# Patient Record
Sex: Male | Born: 1970 | Race: White | Hispanic: No | Marital: Married | Smoking: Current every day smoker
Health system: Southern US, Community
[De-identification: ages and names within clinical notes are randomized; demographics above are authoritative.]

## PROBLEM LIST (undated history)

## (undated) DIAGNOSIS — K219 Gastro-esophageal reflux disease without esophagitis: Secondary | ICD-10-CM

## (undated) DIAGNOSIS — J449 Chronic obstructive pulmonary disease, unspecified: Secondary | ICD-10-CM

## (undated) DIAGNOSIS — I219 Acute myocardial infarction, unspecified: Secondary | ICD-10-CM

## (undated) DIAGNOSIS — I4891 Unspecified atrial fibrillation: Secondary | ICD-10-CM

## (undated) DIAGNOSIS — G4733 Obstructive sleep apnea (adult) (pediatric): Secondary | ICD-10-CM

## (undated) DIAGNOSIS — G8929 Other chronic pain: Secondary | ICD-10-CM

## (undated) DIAGNOSIS — Z9289 Personal history of other medical treatment: Secondary | ICD-10-CM

## (undated) DIAGNOSIS — I10 Essential (primary) hypertension: Secondary | ICD-10-CM

## (undated) DIAGNOSIS — I639 Cerebral infarction, unspecified: Secondary | ICD-10-CM

## (undated) DIAGNOSIS — Z87442 Personal history of urinary calculi: Secondary | ICD-10-CM

## (undated) DIAGNOSIS — R519 Headache, unspecified: Secondary | ICD-10-CM

## (undated) DIAGNOSIS — R569 Unspecified convulsions: Secondary | ICD-10-CM

## (undated) DIAGNOSIS — I499 Cardiac arrhythmia, unspecified: Secondary | ICD-10-CM

## (undated) DIAGNOSIS — R51 Headache: Secondary | ICD-10-CM

## (undated) HISTORY — DX: Gastro-esophageal reflux disease without esophagitis: K21.9

## (undated) HISTORY — DX: Other chronic pain: G89.29

## (undated) HISTORY — DX: Unspecified convulsions: R56.9

## (undated) HISTORY — PX: CHOLECYSTECTOMY: SHX55

## (undated) HISTORY — DX: Personal history of other medical treatment: Z92.89

## (undated) HISTORY — DX: Headache, unspecified: R51.9

## (undated) HISTORY — PX: ELBOW SURGERY: SHX618

## (undated) HISTORY — DX: Headache: R51

---

## 1998-09-26 ENCOUNTER — Emergency Department (HOSPITAL_COMMUNITY): Admission: EM | Admit: 1998-09-26 | Discharge: 1998-09-26 | Payer: Self-pay | Admitting: Emergency Medicine

## 2005-02-26 ENCOUNTER — Emergency Department (HOSPITAL_COMMUNITY): Admission: EM | Admit: 2005-02-26 | Discharge: 2005-02-26 | Payer: Self-pay | Admitting: Emergency Medicine

## 2005-02-28 ENCOUNTER — Emergency Department (HOSPITAL_COMMUNITY): Admission: EM | Admit: 2005-02-28 | Discharge: 2005-02-28 | Payer: Self-pay | Admitting: Family Medicine

## 2005-10-02 ENCOUNTER — Emergency Department: Payer: Self-pay | Admitting: Emergency Medicine

## 2006-02-19 ENCOUNTER — Emergency Department: Payer: Self-pay | Admitting: Emergency Medicine

## 2006-02-27 ENCOUNTER — Emergency Department: Payer: Self-pay | Admitting: Emergency Medicine

## 2006-02-27 ENCOUNTER — Other Ambulatory Visit: Payer: Self-pay

## 2007-10-28 ENCOUNTER — Ambulatory Visit: Payer: Self-pay

## 2007-12-08 ENCOUNTER — Ambulatory Visit: Payer: Self-pay | Admitting: Urology

## 2007-12-24 ENCOUNTER — Ambulatory Visit: Payer: Self-pay | Admitting: Family Medicine

## 2008-01-26 ENCOUNTER — Ambulatory Visit: Payer: Self-pay | Admitting: Surgery

## 2009-04-23 ENCOUNTER — Emergency Department (HOSPITAL_COMMUNITY): Admission: EM | Admit: 2009-04-23 | Discharge: 2009-04-23 | Payer: Self-pay | Admitting: Emergency Medicine

## 2010-02-24 ENCOUNTER — Emergency Department: Payer: Self-pay | Admitting: Emergency Medicine

## 2010-02-28 ENCOUNTER — Ambulatory Visit: Payer: Self-pay | Admitting: Specialist

## 2010-03-28 ENCOUNTER — Encounter: Payer: Self-pay | Admitting: Specialist

## 2010-04-12 ENCOUNTER — Encounter: Payer: Self-pay | Admitting: Specialist

## 2011-02-19 LAB — CBC
MCV: 88.1 fL (ref 78.0–100.0)
Platelets: 272 10*3/uL (ref 150–400)
RDW: 13.3 % (ref 11.5–15.5)
WBC: 8.8 10*3/uL (ref 4.0–10.5)

## 2011-02-19 LAB — DIFFERENTIAL
Basophils Absolute: 0.2 10*3/uL — ABNORMAL HIGH (ref 0.0–0.1)
Basophils Relative: 3 % — ABNORMAL HIGH (ref 0–1)
Eosinophils Absolute: 0.1 10*3/uL (ref 0.0–0.7)
Lymphs Abs: 1.5 10*3/uL (ref 0.7–4.0)
Neutrophils Relative %: 73 % (ref 43–77)

## 2011-02-19 LAB — D-DIMER, QUANTITATIVE: D-Dimer, Quant: 0.39 ug/mL-FEU (ref 0.00–0.48)

## 2011-02-19 LAB — BASIC METABOLIC PANEL
BUN: 16 mg/dL (ref 6–23)
Chloride: 105 mEq/L (ref 96–112)
Creatinine, Ser: 1.17 mg/dL (ref 0.4–1.5)
Glucose, Bld: 106 mg/dL — ABNORMAL HIGH (ref 70–99)

## 2012-02-03 LAB — CBC
HCT: 30.1 % — ABNORMAL LOW (ref 40.0–52.0)
MCH: 28.9 pg (ref 26.0–34.0)
MCV: 87 fL (ref 80–100)
Platelet: 352 10*3/uL (ref 150–440)
RBC: 3.48 10*6/uL — ABNORMAL LOW (ref 4.40–5.90)
WBC: 14.4 10*3/uL — ABNORMAL HIGH (ref 3.8–10.6)

## 2012-02-03 LAB — COMPREHENSIVE METABOLIC PANEL
Albumin: 4.1 g/dL (ref 3.4–5.0)
Alkaline Phosphatase: 49 U/L — ABNORMAL LOW (ref 50–136)
Bilirubin,Total: 0.3 mg/dL (ref 0.2–1.0)
Calcium, Total: 8.4 mg/dL — ABNORMAL LOW (ref 8.5–10.1)
Chloride: 105 mmol/L (ref 98–107)
Co2: 22 mmol/L (ref 21–32)
EGFR (African American): >60
Glucose: 107 mg/dL — ABNORMAL HIGH (ref 65–99)
Potassium: 3.4 mmol/L — ABNORMAL LOW (ref 3.5–5.1)
SGOT(AST): 19 U/L (ref 15–37)
Sodium: 141 mmol/L (ref 136–145)

## 2012-02-03 LAB — DRUG SCREEN, URINE
Amphetamines, Ur Screen: NEGATIVE (ref ?–1000)
Barbiturates, Ur Screen: NEGATIVE (ref ?–200)
Cannabinoid 50 Ng, Ur ~~LOC~~: POSITIVE (ref ?–50)
Cocaine Metabolite,Ur ~~LOC~~: NEGATIVE (ref ?–300)
Opiate, Ur Screen: NEGATIVE (ref ?–300)
Phencyclidine (PCP) Ur S: NEGATIVE (ref ?–25)

## 2012-02-03 LAB — TSH: Thyroid Stimulating Horm: 0.546 u[IU]/mL

## 2012-02-03 LAB — CK TOTAL AND CKMB (NOT AT ARMC): CK-MB: 0.8 ng/mL (ref 0.5–3.6)

## 2012-02-04 ENCOUNTER — Inpatient Hospital Stay: Payer: Self-pay | Admitting: Internal Medicine

## 2012-02-04 DIAGNOSIS — I4891 Unspecified atrial fibrillation: Secondary | ICD-10-CM

## 2012-02-04 LAB — HEMOGLOBIN: HGB: 8.1 g/dL — ABNORMAL LOW (ref 13.0–18.0)

## 2012-02-05 LAB — CBC WITH DIFFERENTIAL/PLATELET
Basophil #: 0.1 10*3/uL (ref 0.0–0.1)
Eosinophil #: 0.3 10*3/uL (ref 0.0–0.7)
Eosinophil %: 3.6 %
HGB: 7.9 g/dL — ABNORMAL LOW (ref 13.0–18.0)
MCH: 29 pg (ref 26.0–34.0)
MCHC: 33.6 g/dL (ref 32.0–36.0)
MCV: 87 fL (ref 80–100)
Neutrophil #: 3.9 10*3/uL (ref 1.4–6.5)
Neutrophil %: 47.5 %

## 2012-02-05 LAB — BASIC METABOLIC PANEL
BUN: 22 mg/dL — ABNORMAL HIGH (ref 7–18)
Co2: 26 mmol/L (ref 21–32)
Creatinine: 1.17 mg/dL (ref 0.60–1.30)
EGFR (Non-African Amer.): >60
Glucose: 102 mg/dL — ABNORMAL HIGH (ref 65–99)
Osmolality: 289 (ref 275–301)
Potassium: 3.7 mmol/L (ref 3.5–5.1)

## 2012-02-06 LAB — BUN: BUN: 16 mg/dL (ref 7–18)

## 2012-03-10 ENCOUNTER — Other Ambulatory Visit: Payer: Self-pay | Admitting: Internal Medicine

## 2012-03-10 LAB — CBC WITH DIFFERENTIAL/PLATELET
Basophil %: 1 %
Eosinophil #: 0.2 10*3/uL (ref 0.0–0.7)
HGB: 11.6 g/dL — ABNORMAL LOW (ref 13.0–18.0)
Lymphocyte #: 2.3 10*3/uL (ref 1.0–3.6)
MCHC: 32.7 g/dL (ref 32.0–36.0)
MCV: 84 fL (ref 80–100)
Monocyte %: 9.7 %
Neutrophil #: 5.3 10*3/uL (ref 1.4–6.5)
Platelet: 441 10*3/uL — ABNORMAL HIGH (ref 150–440)

## 2012-03-10 LAB — COMPREHENSIVE METABOLIC PANEL
Alkaline Phosphatase: 80 U/L (ref 50–136)
Bilirubin,Total: 0.3 mg/dL (ref 0.2–1.0)
Calcium, Total: 9.5 mg/dL (ref 8.5–10.1)
EGFR (African American): >60
Osmolality: 281 (ref 275–301)
SGOT(AST): 19 U/L (ref 15–37)
SGPT (ALT): 27 U/L

## 2012-03-11 ENCOUNTER — Ambulatory Visit: Payer: Self-pay | Admitting: Internal Medicine

## 2012-10-14 ENCOUNTER — Emergency Department: Payer: Self-pay | Admitting: Emergency Medicine

## 2013-07-02 ENCOUNTER — Emergency Department: Payer: Self-pay | Admitting: Emergency Medicine

## 2014-05-11 ENCOUNTER — Emergency Department: Payer: Self-pay | Admitting: Emergency Medicine

## 2015-03-06 NOTE — Consult Note (Signed)
Chief Complaint:   Subjective/Chief Complaint No acute events overnight.  Continued weakness.  No further bleeding.   VITAL SIGNS/ANCILLARY NOTES: **Vital Signs.:   26-Mar-13 05:44   Vital Signs Type Routine   Temperature Temperature (F) 98   Celsius 36.6   Temperature Source oral   Pulse Pulse 77   Pulse source per Dinamap   Respirations Respirations 20   Systolic BP Systolic BP 147   Diastolic BP (mmHg) Diastolic BP (mmHg) 82   Mean BP 100   BP Source Dinamap   Pulse Ox % Pulse Ox % 98   Pulse Ox Activity Level  At rest   Oxygen Delivery Room Air/ 21 %   Brief Assessment:   Additional Physical Exam Nose-  no bright red blood, old clot in right nostril improved OC/OP- no active bleeding or other mass/lesion   Routine Hem:  26-Mar-13 02:11    WBC (CBC) 8.2   RBC (CBC) 2.70   Hemoglobin (CBC) 7.9   Hematocrit (CBC) 23.4   Platelet Count (CBC) 269   MCV 87   MCH 29.0   MCHC 33.6   RDW 14.2  Routine Chem:  26-Mar-13 02:11    Glucose, Serum 102   BUN 22   Creatinine (comp) 1.17   Sodium, Serum 143   Potassium, Serum 3.7   Chloride, Serum 108   CO2, Serum 26   Calcium (Total), Serum 8.4   Osmolality (calc) 289   eGFR (African American) >60   eGFR (Non-African American) >60   Anion Gap 9  Routine Hem:  26-Mar-13 02:11    Neutrophil % 47.5   Lymphocyte % 38.7   Monocyte % 8.9   Eosinophil % 3.6   Basophil % 1.3   Neutrophil # 3.9   Lymphocyte # 3.2   Monocyte # 0.7   Eosinophil # 0.3   Basophil # 0.1   Assessment/Plan:  Assessment/Plan:   Assessment Epistaxis and malignant htn    Plan No further epistaxis with packing removal/saline/afrin.  Would continue afrin for next 24 hours.  As no need for anticoagulation would hold off on cauterization or merocel packing with no bleeding.  Will sign off and have patient follow up as outpatient if bleeding returns.   Electronic Signatures: Leandre Wien, Shela Leff (MD)  (Signed 26-Mar-13 07:36)  Authored:  Chief Complaint, VITAL SIGNS/ANCILLARY NOTES, Brief Assessment, Lab Results, Assessment/Plan   Last Updated: 26-Mar-13 07:36 by Pascal Lux (MD)

## 2015-03-06 NOTE — Consult Note (Signed)
PATIENT NAME:  Jason Romero, Jason Romero MR#:  [id number] DATE OF BIRTH:  [birth year]  DATE OF CONSULTATION:  02/04/2012  REFERRING PHYSICIAN:  Larena GlassmanAmir Firozvi, MD CONSULTING PHYSICIAN:  Dwayne D. Callwood, MD  INDICATION: Atrial fibrillation and malignant hypertension with nose bleeding.   HISTORY OF PRESENT ILLNESS: [name] is a 44 year old white male with history of hypertension, untreated. He has no primary physician. He has had some recurrent nosebleeds which caused anemia. [place] started having the bleeding. Because it did not go away, he finally came to the emergency room for evaluation and was found to have an elevated blood pressure of over 200, so he was brought to the emergency room for evaluation. He was found to be in rapid atrial fibrillation of unclear etiology. He was subsequently treated for malignant hypertension and bleeding with weakness and fatigue. He denied any chest pain.  No gastrointestinal bleeding was noted.  REVIEW OF SYSTEMS: No blackout spells or syncope. No nausea or vomiting. No fever. No chills. No sweats. No weight loss or weight gain. No hemoptysis or hematemesis. No bright red blood per rectum. He has had nose bleeds, poorly controlled hypertension, and history of smoking.   PAST MEDICAL HISTORY:  1. Hypertension.  2. Reflux.   PAST SURGICAL HISTORY:  1. Scrotal cyst removal three years ago. 2. Cholecystectomy.  3. Injury to scalp.  4. Elbow fracture.   SOCIAL HISTORY: Married, living with his wife.   FAMILY HISTORY: Noncontributory.   ALLERGIES: No known drug allergies.   MEDICATIONS: None.   PHYSICAL EXAMINATION:   VITAL SIGNS: Blood pressure was 225/110 initially, pulse 100, and respiratory rate 16, with a nose bleed.   HEENT: Normocephalic, atraumatic. Pupils reactive to light. Nose bleeding which has stopped now.   NECK: Supple. No jugular venous distention, bruits or adenopathy.   LUNGS: Clear to auscultation and  percussion. No significant wheeze, rhonchi, or rale.   HEART: Regular rate and rhythm.   ABDOMEN: Positive bowel sounds. No rebound, guarding, or tenderness.   EXTREMITIES: Examination is within normal limits.   NEUROLOGIC: Examination is intact.   SKIN: Examination is normal.   LABS/STUDIES: Glucose 107, BUN 35, creatinine 1.2, and sodium 141. CPKs were negative. TSH 0.5. Drug screen positive for marijuana. WBC 14, hemoglobin 10, hematocrit 30, and platelet count 252.   EKG: Atrial fibrillation, rapid ventricular response, nonspecific findings.   ASSESSMENT:  1. Atrial fibrillation. 2. Paroxysmal malignant hypertension. 3. Smoking. 4. Recurrent nosebleeds.  5. Mild anemia.  6. Tobacco and marijuana abuse. 7. Reflux.   PLAN: I agree with admit. Hold anticoagulation. Rate control and blood pressure control with either calcium blocker or beta blocker. Agree with ENT evaluation. Advised the patient to quit smoking. Have the patient establish with a primary doctor to control his blood pressure which in turn will help his atrial fibrillation. Have the patient follow-up with cardiology as necessary.  ____________________________ Bobbie Stackwayne D. Juliann Paresallwood, MD ddc:slb D: 02/05/2012 10:48:00 ET T: 02/05/2012 11:34:46 ET JOB#: [id number]  cc: Dwayne D. Juliann Paresallwood, MD, <Dictator> Alwyn PeaWAYNE D CALLWOOD MD ELECTRONICALLY SIGNED 03/07/2012 15:48

## 2015-03-06 NOTE — Consult Note (Signed)
Brief Consult Note: Diagnosis: Epistaxis, Hypertension.   Patient was seen by consultant.   Consult note dictated.   Recommend further assessment or treatment.   Orders entered.   Comments: 44 y.o. male with history of uncontrolled HTN with history of epistaxis x 4 days.  Mostly from right nostril.  Able to get stopped intermittently, but [facility] and rec'd [facility] E[facility] evaluation.  He had continued bleeding with headache and [facility].  Bleeding stopped with control of blood pressure.  Patient reports not current bleeding but still has gauze in nose.  Feels tired but no respiratory issues.  PE- Ears- prominent vessels on left TM, no effusion Nose- packing removed, with clot in right nasal cavity, no active bleeding, left clear OC/OP- no posterior bleeding, no abnormal mass or lesion Neck- clear  Impression:  Epistaxis and uncontrolled HTN with resolution of epistaxis with blood pressure control Plan: 1)  No nose blowing or trauma to nose 2)  Afrin QID X 48 hours 3)  Nasal Saline irrigation 4)  Will re-evaluate later today, will need packing if continues.  Electronic Signatures: Lavontae Cornia, Rayfield Citizenreighton Charles (MD)  (Signed [phone]-Mar-13 11:15)  Authored: Brief Consult Note   Last Updated: 25-Mar-13 11:15 by Flossie DibbleVaught, Oleva Koo Charles (MD)

## 2015-03-06 NOTE — Discharge Summary (Signed)
PATIENT NAME:  Jason Romero, Jason Romero MR#:  [id number] DATE OF BIRTH:  01/25/1971  DATE OF ADMISSION:  02/04/2012 DATE OF DISCHARGE:  02/06/2012  PRIMARY CARE PHYSICIAN: Dr. Loma Sender.  The patient is establishing care to see Dr. Burnadette Pop starting 04/01/2012.  REASON FOR ADMISSION: Recurrent nosebleeds, headache, uncontrolled hypertension, and heart palpitations.   DISCHARGE DIAGNOSES:  1. Epistaxis due to malignant hypertension.  2. Malignant hypertension. 3. Acute posthemorrhagic (symptomatic) anemia due to epistaxis requiring blood transfusion.  4. History of hypertension.  5. History of gastroesophageal reflux disease.  6. Removal of scrotal cyst.  7. Cholecystectomy.  8. History of scalp injury with chain saw approximately 10 years ago.  9. History of left elbow fracture.  10. New onset atrial fibrillation with rapid ventricular response, paroxysmal atrial fibrillation felt to be driven by either malignant hypertension or anemia.  11. Hypokalemia, resolved.  12. Leukocytosis, felt to be stress induced, with resolution of leukocytosis.   CONSULTS:  1. ENT, Dr. Andee Poles.  2. Cardiology, Dr. Juliann Pares.   DISCHARGE DISPOSITION: Home.    DISCHARGE MEDICATIONS:  1. HCTZ 25 mg p.o. daily.  2. Cardizem CD 120 mg p.o. daily.  3. Tylenol 325 mg 1 to 2 tablets p.o. every 4 to 6 hours p.r.n. pain.   DISCHARGE CONDITION: Improved, stable.   DISCHARGE ACTIVITY: As tolerated.   DISCHARGE DIET: Low sodium.   DISCHARGE INSTRUCTIONS:  1. Take medications as prescribed.  2. Return to the Emergency Department  for recurrence of nosebleeds or for any development of chest pain, shortness of breath, heart palpitations, numbness, tingling, difficulty speaking or swallowing, drooping of face, or weakness.  FOLLOW-UP INSTRUCTIONS: 1. Follow up with Dr. Loma Sender in 1 to 2 weeks. The patient needs repeat blood pressure check and repeat hemoglobin within one week. 2. Follow up with Dr.  Burnadette Pop 04/01/2012. 3. Follow up with Dr. Juliann Pares on 02/15/2012 at 10:45 a.m.   PROCEDURES:  1. Chest x-ray PA and lateral 02/03/2012: No acute cardiopulmonary abnormalities are noted.  2. 2-D echocardiogram on 02/04/2012: LV grossly normal in size. No thrombus. LV systolic function normal. Ejection fraction greater than 55%. Normal LV wall thickness and wall motion. RV systolic function is normal.  3. Nasal packing done by Dr. Andee Poles with removal of packing prior to discharge.   PERTINENT LABORATORY DATA: Complete metabolic panel within normal limits except for BUN 35 and potassium 3.4. BUN 16 on the day of discharge, potassium 3.7 on  02/05/2012. TSH 0.546.  Cardiac enzymes negative on admission.   CBC on admission: WBC 14.1, hemoglobin 10.1, hematocrit 30.1, platelets 252. Hemoglobin fell to 7.9 on  02/05/2012 after which the patient was transfused blood and hemoglobin was 9.6 on the day of discharge.   EKG on admission with atrial fibrillation with rapid ventricular response, heart rate 120 beats per minute without acute ST or T wave changes.   EKG on 02/04/2012: Normal sinus rhythm, heart rate 86 beats per minute. Nonspecific ST and T wave changes.   BRIEF HISTORY/HOSPITAL COURSE: The patient is a pleasant 44 year old male with past medical history of hypertension and gastroesophageal reflux disease who presented to the Emergency Department secondary to epistaxis and was noted to have malignant hypertension in addition to atrial fibrillation with RVR. Please see dictated admission History and Physical for pertinent details surrounding the onset of this hospitalization. Please see below for further details. 1. Epistaxis: For which the patient was seen by ENT and his epistaxis was felt to be secondary to malignant hypertension.  Initially the patient required nasal packing by Dr. Andee PolesVaught.  He was also placed on Afrin, intranasal saline spray, as well as bacitracin ointment as advised by ENT.   Dr. Andee PolesVaught recommended blood pressure control. After the patient's blood pressure stabilized he had no further epistaxis. He will not need followup with ENT at this time unless epistaxis recurs, but Dr. Andee PolesVaught strongly advised aggressive blood pressure control. The patient did lose a significant amount of blood secondary to epistaxis and he had generalized weakness; he was felt to have symptomatic anemia and after transfusion of blood his weakness had resolved. His anemia was acute posthemorrhagic secondary to epistaxis. As above, epistaxis resolved with blood pressure  stabilization.  2. Malignant hypertension: The patient was started on gradual blood pressure reduction and was started on HCTZ but also on Cardizem for heart rate control, given atrial fibrillation with RVR. With these blood pressure medications blood pressure was gradually reduced and blood pressure is well controlled on the day of discharge. The patient is tolerating medications well and after blood pressure had stabilized his epistaxis had resolved.  3. New onset atrial fibrillation with rapid ventricular response:  For which the patient was seen by cardiology and felt to have paroxysmal atrial fibrillation.  He was placed on calcium channel blocker therapy initially and thereafter spontaneously converted to normal sinus rhythm and atrial fibrillation did not recur. Dr. Juliann Paresallwood was in agreement with Cardizem for heart rate control and felt that atrial fibrillation could be driven by either malignant hypertension versus anemia. The patient's CHAD2 score is 1 and there is no need for anticoagulation at this time per cardiology. Dr. Juliann Paresallwood did not recommend keeping the patient on antiplatelet therapy including aspirin given the patient's epistaxis and anemia and increased risk of bleeding at this time. The patient will follow up with Dr. Juliann Paresallwood of cardiology closely as an outpatient for atrial fibrillation.  4. Hypokalemia: Replaced with  potassium chloride with resolution of serum potassium level thereafter. 5. Elevated BUN: Now resolved. The patient stated that he digested and swallowed some blood due to profound epistaxis and this is likely the explanation of his elevated BUN, but otherwise he was not dehydrated and BUN normalized with IV fluids.  6. Leukocytosis: Felt to be of noninfectious etiology and likely stress induced. WBC count is normal at the time of discharge. 7. Gastroesophageal reflux disease:  Stable. The patient was asymptomatic and denied any reflux, indigestion, heartburn, or dyspepsia this hospitalization.   On 02/06/2012 the patient was hemodynamically stable without any epistaxis and blood pressure was well controlled and at goal, and he was in normal sinus rhythm. He was felt to be stable for discharge home with close outpatient followup to which the patient was agreeable.   TIME SPENT ON DISCHARGE:  Greater than 30 minutes.    ____________________________ Elon AlasKamran N. Tevyn Codd, MD knl:bjt D: 02/10/2012 21:28:00 ET T: 02/12/2012 11:26:10 ET JOB#: [id number]  cc: Elon AlasKamran N. Kule Gascoigne, MD, <Dictator> Marisue IvanKanhka Linthavong, MD Marcine Matarharles W. Phillips Jr., MD Dwayne D. Juliann Paresallwood, MD Elon AlasKAMRAN N Regan Llorente MD ELECTRONICALLY SIGNED 02/20/2012 22:01

## 2015-03-06 NOTE — H&P (Signed)
PATIENT NAME:  Jason Romero, Italo H MR#:  [id number] DATE OF BIRTH:  1971/03/14  DATE OF ADMISSION:  02/03/2012  PRIMARY CARE PHYSICIAN: None.  CHIEF COMPLAINT: Recurrent nosebleed, headache, and uncontrolled hypertension and palpitations.   HISTORY OF PRESENT ILLNESS: [name] is a 44 year old Caucasian male with history of hypertension that is untreated. The patient has no primary care physician. He started to have recurrent nosebleeds since Thursday and this has continued for the last four days. He went to another emergency department at another hospital. He was given Tylenol and told to follow up as an outpatient and if there is recurrent nose bleed to see ENT. The patient [facility] and he started to have spontaneous nosebleeds. His blood pressure was high reaching 202/125. He developed headache and he also noticed some shortness of breath and dizziness, especially upon standing. His dizziness subsides if he lies down. Upon arrival here, at our hospital, it was noticed here that he has some palpitations. Evaluation here reveals atrial fibrillation. While the patient has been here, he vomited one time and blood appears to be swallowed from his nose bleed. He denies having any abdominal pain. No prior history of peptic ulcer disease or GI bleed. The patient was admitted for further evaluation of his medical problems.   REVIEW OF SYSTEMS: CONSTITUTIONAL: The patient denies any fever. No chills. No night sweats. No fatigue. EYES: Denies blurring of vision. No double vision. ENT: No hearing impairment. No sore throat. No dysphagia. CARDIOVASCULAR: No chest pain. Reports some shortness of breath. No peripheral edema. No syncope but he has dizziness especially upon standing. RESPIRATORY: No cough. No sputum production. No chest pain. GASTROINTESTINAL: No abdominal pain. He had one episode of vomiting here that has some blood. No diarrhea. No constipation. No melena or  hematochezia. GENITOURINARY: No dysuria or frequency of urination. MUSCULOSKELETAL: No joint pain or swelling. No muscular pain or swelling. INTEGUMENTARY: No skin rash. No ulcers. NEUROLOGY: No focal weakness. No seizure activity. He admits having some headache. PSYCHIATRY: No anxiety. No depression. ENDOCRINE: No night sweats. No polyuria or polydipsia. No heat or cold intolerance.    PAST MEDICAL HISTORY:  1. History of systemic hypertension.  2. History of gastroesophageal reflux disease.   PAST SURGICAL HISTORY:  1. History of scrotal cyst removal a few years ago.  2. History of cholecystectomy.  3. History of injury to the scalp area with a chain saw about 10 years ago.  4. History of left elbow fracture.   SOCIAL HISTORY: He is married and living with his wife.   FAMILY HISTORY: He reports that he does not have much information about his family. He was raised by his stepfather.   ADMISSION MEDICATIONS: None.   ALLERGIES: No known drug allergies.  SOCIAL HABITS: Chronic smoker of one pack per day since age of 44. He smokes marijuana every now and then. He denies any cocaine abuse or other drug abuse, and no alcohol abuse as well.   PHYSICAL EXAMINATION:   VITAL SIGNS: Initial blood pressure was 225 systolic. His blood pressure gradually went down after receiving intravenous diltiazem, down to 125/83. Respiratory rate is 18, pulse 107, temperature 97.6, and oxygen saturation 99%.   GENERAL APPEARANCE: Young male lying in bed in no acute distress.   HEAD AND NECK EXAMINATION: No pallor. No icterus. No cyanosis.  EARS, NOSE, AND THROAT: Hearing was normal. Nasal mucosa - his nose is packed with gauze and there is crusted old  blood.  Lips and tongue were normal.   EYES: Normal iris and conjunctivae. There is mild exophthalmos.  Pupils are about 4 to 5 mm, equal and reactive to light.   NECK: Supple. Trachea at midline. No thyromegaly. No cervical lymphadenopathy. No masses.    HEART: Irregular S1 and S2. No S3 or S4. No murmur. No gallop. No carotid bruits.   RESPIRATORY: Normal breathing pattern without use of accessory muscles. No rales. No wheezing.   ABDOMEN: Soft without tenderness. No hepatosplenomegaly. No masses. No hernias.   SKIN: No ulcers. No subcutaneous nodules.   MUSCULOSKELETAL: No joint swelling. No clubbing.   NEUROLOGIC: Cranial nerves II through XII were intact. No focal motor deficit.   PSYCHIATRIC: The patient is alert and oriented x3. Mood and affect were normal.   LABS/STUDIES: Chest x-ray showed heart size was normal. No consolidation. No effusion.   Serum glucose 107, BUN 35, creatinine 1.2, sodium 141, potassium 3.4. Liver function tests were normal. CPK 77. Troponin 0.02. Thyroid stimulating hormone was 0.5. Drug screen was positive for cannabinoid. CBC showed white count of 14,000, hemoglobin 10, hematocrit 30, and platelet count 352.   EKG showed atrial fibrillation with rapid ventricular rate at 122 per minute. Nonspecific ST-T wave abnormalities.   ASSESSMENT:  1. Atrial fibrillation with rapid ventricular rate, likely this is new onset since his symptoms just started, in terms of palpitations.  2. Uncontrolled hypertension.  3. Recurrent epistaxis. 4. Normochromic, normocytic anemia likely from his nose bleed.  5. Tobacco and marijuana abuse.  6. History of gastroesophageal reflux disease.  7. History of cholecystectomy. 8. History of left elbow fracture.   PLAN: We will admit the patient to the medical floor on telemetry monitoring. I will continue diltiazem. I will avoid using aspirin in this case. We will obtain echocardiogram and cardiology consultation. ENT consult to look into his recurrent epistaxis. Repeat hemoglobin in the morning, and I will also repeat EKG in the a.m. The patient needs optimal control of his blood pressure. I will use a combination of diltiazem along with hydrochlorothiazide. Nicotine patch and  the patient needs to quit smoking.   TIME SPENT EVALUATING THIS PATIENT: More than 55 minutes.  ____________________________ Carney Corners. Rudene Re, MD amd:slb D: 02/04/2012 00:04:00 ET T: 02/04/2012 07:41:26 ET JOB#: [id number]  cc: Carney Corners. Rudene Re, MD, <Dictator> Zollie Scale MD ELECTRONICALLY SIGNED 02/05/2012 22:47

## 2015-03-06 NOTE — Consult Note (Signed)
Chief Complaint:   Subjective/Chief Complaint no epistaxis since packing removed.  patient passed small amount of clot and has had some old blood discharge but no bright red blood   Brief Assessment:   Respiratory normal resp effort   Assessment/Plan:  Assessment/Plan:   Assessment Epistaxis, uncontrolled HTN    Plan No further epistaxis with packing removed.  Cont Afrin, saline, bacitracin ointment.   Electronic Signatures: Glenden Rossell, Rayfield Citizenreighton Charles (MD)  (Signed [phone]-Mar-13 15:54)  Authored: Chief Complaint, Brief Assessment, Assessment/Plan   Last Updated: 25-Mar-13 15:54 by Flossie DibbleVaught, Kasheem Toner Charles (MD)

## 2015-03-06 NOTE — Consult Note (Signed)
PATIENT NAME:  Vilma PraderLAKINS, Chidi H MR#:  [id number] DATE OF BIRTH:  [birth year]  DATE OF CONSULTATION:  02/04/2012  REFERRING PHYSICIAN:  Dr. Rudene Rearwish  CONSULTING PHYSICIAN:  Kyung Ruddreighton C. Kanai Hilger, MD  REASON FOR CONSULTATION: Epistaxis.   HISTORY OF PRESENT ILLNESS: Patient is a 44 year old male with history of uncontrolled hypertension who presented to an outside ED for evaluation of epistaxis. This was controlled with topical pressure. He was discharged and referred for an outpatient evaluation with ENT. [facility] when the bleeding recurred and was admitted with blood pressure in the 220 systolic and 120s diastolic. After admission his nosebleed has stopped. I was asked for evaluation given his history of recurrent epistaxis. He was also noted to have atrial fibrillation as well. He reports that his the bleeding has stopped since he has been placing some packing gauze in his nose.   PAST MEDICAL HISTORY:  1. Hypertension. 2. Reflux. 3. MRSA.   PAST SURGICAL HISTORY:  1. Cholecystectomy. 2. Cyst in his groin.   FAMILY HISTORY: Noncontributory.   SOCIAL HISTORY: Patient has a history of intermittent marijuana use.  ALLERGIES: No known drug allergies.   CURRENT MEDICATIONS: Reviewed and documented in the medical record.   PHYSICAL EXAMINATION:  GENERAL: Patient is a 44 year old male in no acute distress. HE is alert and oriented x3.   EARS: EACs are clear. TMs are intact. No perforation or effusion. He has some hypervascularity over his left drum.   NOSE: Nose reveals a clot filling the right nasal cavity after the packing was removed. There is no evidence of any active bleeding. Left nasal cavity is clear.  ORAL CAVITY AND OROPHARYNX: No posterior oropharyngeal bleeding or mass or lesion.   NECK: Supple. No lymphadenopathy or thyromegaly.   IMPRESSION: Epistaxis with uncontrolled hypertension. No active bleeding now that blood pressure is normalized.   PLAN: I discussed  my findings with the patient. I recommend topical management at this point where many times just controlling of uncontrolled hypertension will then resolve hypertension and prevent need for packing. If he continues to bleed he will need a pack placed in his right nasal cavity. I will place him on some Afrin as well as nasal saline and bacitracin ointment to his nose and I will check on him later on today.  ____________________________ Kyung Ruddreighton C. India Jolin, MD ccv:cms D: 02/04/2012 11:42:00 ET T: 02/04/2012 11:52:08 ET JOB#: [id number]  cc: Kyung Ruddreighton C. Savhanna Sliva, MD, <Dictator> Kyung RuddREIGHTON C Blaze Sandin MD ELECTRONICALLY SIGNED 02/11/2012 9:57

## 2015-04-25 ENCOUNTER — Emergency Department: Payer: Self-pay

## 2015-04-25 ENCOUNTER — Emergency Department
Admission: EM | Admit: 2015-04-25 | Discharge: 2015-04-25 | Disposition: A | Discharge status: Home / Self Care | Payer: Self-pay | Attending: Emergency Medicine | Admitting: Emergency Medicine

## 2015-04-25 ENCOUNTER — Other Ambulatory Visit: Payer: Self-pay

## 2015-04-25 ENCOUNTER — Encounter: Payer: Self-pay | Admitting: Emergency Medicine

## 2015-04-25 DIAGNOSIS — R51 Headache: Secondary | ICD-10-CM | POA: Insufficient documentation

## 2015-04-25 DIAGNOSIS — R079 Chest pain, unspecified: Secondary | ICD-10-CM | POA: Insufficient documentation

## 2015-04-25 DIAGNOSIS — M25473 Effusion, unspecified ankle: Secondary | ICD-10-CM | POA: Insufficient documentation

## 2015-04-25 DIAGNOSIS — I252 Old myocardial infarction: Secondary | ICD-10-CM | POA: Insufficient documentation

## 2015-04-25 DIAGNOSIS — Z79899 Other long term (current) drug therapy: Secondary | ICD-10-CM | POA: Insufficient documentation

## 2015-04-25 DIAGNOSIS — Z720 Tobacco use: Secondary | ICD-10-CM | POA: Insufficient documentation

## 2015-04-25 DIAGNOSIS — Z7982 Long term (current) use of aspirin: Secondary | ICD-10-CM | POA: Insufficient documentation

## 2015-04-25 DIAGNOSIS — I10 Essential (primary) hypertension: Secondary | ICD-10-CM | POA: Insufficient documentation

## 2015-04-25 HISTORY — DX: Chronic obstructive pulmonary disease, unspecified: J44.9

## 2015-04-25 HISTORY — DX: Essential (primary) hypertension: I10

## 2015-04-25 HISTORY — DX: Unspecified atrial fibrillation: I48.91

## 2015-04-25 HISTORY — DX: Acute myocardial infarction, unspecified: I21.9

## 2015-04-25 LAB — BASIC METABOLIC PANEL
ANION GAP: 8 (ref 5–15)
BUN: 16 mg/dL (ref 6–20)
CHLORIDE: 112 mmol/L — AB (ref 101–111)
CO2: 21 mmol/L — ABNORMAL LOW (ref 22–32)
Calcium: 9.7 mg/dL (ref 8.9–10.3)
Creatinine, Ser: 1 mg/dL (ref 0.61–1.24)
GFR calc non Af Amer: >60 mL/min (ref >60)
Glucose, Bld: 114 mg/dL — ABNORMAL HIGH (ref 65–99)
POTASSIUM: 3.5 mmol/L (ref 3.5–5.1)
SODIUM: 141 mmol/L (ref 135–145)

## 2015-04-25 LAB — CBC
HCT: 44.3 % (ref 40.0–52.0)
HEMOGLOBIN: 14.8 g/dL (ref 13.0–18.0)
MCH: 28.8 pg (ref 26.0–34.0)
MCHC: 33.4 g/dL (ref 32.0–36.0)
MCV: 86.4 fL (ref 80.0–100.0)
Platelets: 344 10*3/uL (ref 150–440)
RBC: 5.12 MIL/uL (ref 4.40–5.90)
RDW: 15.8 % — ABNORMAL HIGH (ref 11.5–14.5)
WBC: 8.9 10*3/uL (ref 3.8–10.6)

## 2015-04-25 LAB — TROPONIN I
TROPONIN I: 0.03 ng/mL (ref <0.031)
TROPONIN I: 0.03 ng/mL (ref <0.031)

## 2015-04-25 MED ORDER — ACETAMINOPHEN 500 MG PO TABS
1000.0000 mg | ORAL_TABLET | Freq: Once | ORAL | Status: AC
  Administered 2015-04-25: 1000 mg via ORAL

## 2015-04-25 MED ORDER — ACETAMINOPHEN 500 MG PO TABS
ORAL_TABLET | ORAL | Status: AC
  Administered 2015-04-25: 1000 mg via ORAL
  Filled 2015-04-25: qty 2

## 2015-04-25 MED ORDER — NITROGLYCERIN 0.4 MG SL SUBL
0.4000 mg | SUBLINGUAL_TABLET | SUBLINGUAL | Status: DC | PRN
  Administered 2015-04-25: 0.4 mg via SUBLINGUAL

## 2015-04-25 MED ORDER — NITROGLYCERIN 0.4 MG SL SUBL
SUBLINGUAL_TABLET | SUBLINGUAL | Status: AC
  Administered 2015-04-25: 0.4 mg via SUBLINGUAL
  Filled 2015-04-25: qty 1

## 2015-04-25 NOTE — ED Notes (Signed)
Pt reports that he developed chest pain that radiates down his left arm and into his back. He reports that when he bends over he gets dizzy and has been coughing up white phlem. He is able to speak in complete clear sentences. He does smell of cigarette smoke.

## 2015-04-25 NOTE — ED Provider Notes (Signed)
[facility] Emergency Department Provider Note  ____________________________________________  Time seen: Approximately 5:40 PM  I have reviewed the triage vital signs and the nursing notes.   HISTORY  Chief Complaint Chest Pain    HPI Jason Romero is a 44 y.o. male with history of hypertension, MI,tobacco abuse who presents to the emergency department after central chest pain radiating to his left arm awoke him from sleep between 3 and 4 AM. The pain was associated with nausea, diaphoresis, and lightheadedness on bending down. Patient noted his blood pressure was 180/140 at the time of the episode. He took one of his wife's nitroglycerin tablets which brought the pain from a level 10 to a 6 and he went back to sleep until his alarm went off at 6 AM. Then at 10 AM while laying down the pain began to increase back to a 10. It is currently at a 6 and feels worse in his left arm than in his chest. He he states he has numerous similar episodes in the past. He states at baseline his blood pressure runs around 160/140. He states he has felt "worn out" all day.  He endorses chills but denies fever. He did not vomit but coughed up a large amount of white phlegm with the initial episode today. He states he has daily brown phlegm. He is on 5 antihypertensives which she states have been at the same dose for several years. He states he had a cardiac catheterization by Dr. Milta Deiters last year that did not require any stents. He thinks this was his second catheterization his first was done by Dr. Juliann Pares.  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   Past Medical History  Diagnosis Date  . Hypertension   . COPD (chronic obstructive pulmonary disease)   . MI (myocardial infarction)   . A-fib     There are no active problems to display for this patient.   Past Surgical History  Procedure Laterality Date  . Cholecystectomy      Current Outpatient Rx   Name  Route  Sig  Dispense  Refill  . acetaminophen (TYLENOL) 500 MG tablet   Oral   Take 1,000 mg by mouth every 6 (six) hours as needed for mild pain or headache.         . ALPRAZolam (XANAX) 0.25 MG tablet   Oral   Take 0.25 mg by mouth 4 (four) times daily as needed for anxiety.         Marland Kitchen aspirin EC 81 MG tablet   Oral   Take 81 mg by mouth daily.         . cloNIDine (CATAPRES) 0.1 MG tablet   Oral   Take 0.1 mg by mouth 2 (two) times daily.         . hydrALAZINE (APRESOLINE) 25 MG tablet   Oral   Take 25 mg by mouth 3 (three) times daily.         . hydrochlorothiazide (MICROZIDE) 12.5 MG capsule   Oral   Take 12.5 mg by mouth daily.         Marland Kitchen lisinopril (PRINIVIL,ZESTRIL) 40 MG tablet   Oral   Take 40 mg by mouth daily.         . metoprolol (LOPRESSOR) 100 MG tablet   Oral   Take 100 mg by mouth 2 (two) times daily.         . nitroGLYCERIN (NITROSTAT) 0.4  MG SL tablet   Sublingual   Place 0.4 mg under the tongue every 5 (five) minutes as needed for chest pain.           Allergies Hydrocodone and Ibuprofen  History reviewed. No pertinent family history.  Social History History  Substance Use Topics  . Smoking status: Current Every Day Smoker  . Smokeless tobacco: Not on file  . Alcohol Use: No    Review of Systems Constitutional: See history of present illness Eyes: No visual changes. ENT: No sore throat. Cardiovascular: See history of present illness Respiratory: See history of present illness Gastrointestinal: No abdominal pain.  no vomiting.  No diarrhea.   Musculoskeletal: Negative for back pain. Does endorse mild ankle swelling that gets worse after he is been on his feet all day. Skin: Negative for rash. Neurological: Mild headache since being in the ER. No focal weakness or numbness. Psychiatric:Mood is normal Endocrine:No hot/cold intolerance 10-point ROS otherwise  negative.  ____________________________________________   PHYSICAL EXAM:  VITAL SIGNS: ED Triage Vitals  Enc Vitals Group     BP 04/25/15 1332 169/101 mmHg     Pulse Rate 04/25/15 1332 66     Resp 04/25/15 1332 20     Temp 04/25/15 1332 98.1 F (36.7 C)     Temp Source 04/25/15 1332 Oral     SpO2 04/25/15 1332 93 %     Weight 04/25/15 1326 275 lb (124.739 kg)     Height 04/25/15 1326  (1.88 m)     Head Cir --      Peak Flow --      Pain Score 04/25/15 1327 5     Pain Loc --      Pain Edu? --      Excl. in GC? --     Constitutional: Alert and oriented. Well appearing and in no acute distress. Eyes: Conjunctivae are normal. PERRL. EOMI. Head: Atraumatic. Nose: No congestion/rhinnorhea. Mouth/Throat: Mucous membranes are slightly dry.  Oropharynx non-erythematous. Neck: No stridor.   Lymphatic: No cervical lymphadenopathy. Cardiovascular: Normal rate, regular rhythm. Grossly normal heart sounds.  Peripheral pulses 2+ B Respiratory: Normal respiratory effort.  No retractions. Lungs CTAB. Gastrointestinal: Soft and nontender. No distention. No CVA tenderness. Musculoskeletal: No lower extremity tenderness nor edema.  No calf TTP. Neurologic:  Normal speech and language. No gross focal neurologic deficits are appreciated. Speech is normal.  Skin:  Skin is warm, dry and intact. No rash noted. Skin is tanned Psychiatric: Mood and affect are normal. Speech and behavior are normal.  ____________________________________________   LABS (all labs ordered are listed, but only abnormal results are displayed)  Labs Reviewed  CBC - Abnormal; Notable for the following:    RDW 15.8 (*)    All other components within normal limits  BASIC METABOLIC PANEL - Abnormal; Notable for the following:    Chloride 112 (*)    CO2 21 (*)    Glucose, Bld 114 (*)    All other components within normal limits  TROPONIN I  TROPONIN I   ____________________________________________  EKG    Date: 04/25/2015 1627  Rate: 59  Rhythm: normal sinus rhythm  QRS Axis: normal  Intervals: normal  ST/T Wave abnormalities: normal  Conduction Disutrbances: none  Narrative Interpretation: unremarkable  ________________________________________  RADIOLOGY  Chest x-ray-normal ____________________________________________   PROCEDURES  Procedure(s) performed: None  Critical Care performed: No  ____________________________________________   INITIAL IMPRESSION / ASSESSMENT AND PLAN / ED COURSE  Pertinent labs & imaging  results that were available during my care of the patient were reviewed by me and considered in my medical decision making (see chart for details).  Chest pain concerning for possible ACS. We will cycle enzymes 2 and discussed with Dr. Welton Flakes.  ----------------------------------------- 6:41 PM on 04/25/2015 -----------------------------------------  Patient now pain-free. Blood pressure 141/78 on my exam after patient received nitroglycerin. I discussed the case with Dr. Welton Flakes. He agrees with outpatient management given that patient's troponins are negative 2 and patient has had multiple similar episodes in the past. He advises increasing patient's clonidine to 0.2 mg twice a day and he will see Mr. Haskew in his office tomorrow at 9 AM. Patient and his wife were updated on this plan and they agree to follow up closely. ____________________________________________   FINAL CLINICAL IMPRESSION(S) / ED DIAGNOSES  Chest pain Hypertension, poorly controlled    Maurilio Lovely, MD 04/25/15 1843

## 2015-04-25 NOTE — Discharge Instructions (Signed)
Dr. Welton Flakes will see you in his office tomorrow at 9 AM. It is very important that he keep this appointment for further evaluation and treatment of your symptoms. Return to the emergency department for new or worsening pain, difficulty breathing, sweating, nausea, or for any other concerns.   Dr. Welton Flakes advises you to increase your clonidine to 0.2 mg twice daily.  Chest Pain (Nonspecific) It is often hard to give a diagnosis for the cause of chest pain. There is always a chance that your pain could be related to something serious, such as a heart attack or a blood clot in the lungs. You need to follow up with your doctor. HOME CARE  If antibiotic medicine was given, take it as directed by your doctor. Finish the medicine even if you start to feel better.  For the next few days, avoid activities that bring on chest pain. Continue physical activities as told by your doctor.  Do not use any tobacco products. This includes cigarettes, chewing tobacco, and e-cigarettes.  Avoid drinking alcohol.  Only take medicine as told by your doctor.  Follow your doctor's suggestions for more testing if your chest pain does not go away.  Keep all doctor visits you made. GET HELP IF:  Your chest pain does not go away, even after treatment.  You have a rash with blisters on your chest.  You have a fever. GET HELP RIGHT AWAY IF:   You have more pain or pain that spreads to your arm, neck, jaw, back, or belly (abdomen).  You have shortness of breath.  You cough more than usual or cough up blood.  You have very bad back or belly pain.  You feel sick to your stomach (nauseous) or throw up (vomit).  You have very bad weakness.  You pass out (faint).  You have chills. This is an emergency. Do not wait to see if the problems will go away. Call your local emergency services (911 in [place] Do not drive yourself to the hospital. MAKE SURE YOU:   Understand these instructions.  Will watch your  condition.  Will get help right away if you are not doing well or get worse. Document Released: 04/16/2008 Document Revised: 11/03/2013 Document Reviewed: 04/16/2008 [facility] Patient Information 2015 [place], [place]. This information is not intended to replace advice given to you by your health care provider. Make sure you discuss any questions you have with your health care provider.

## 2015-05-25 ENCOUNTER — Emergency Department (HOSPITAL_COMMUNITY)
Admission: EM | Admit: 2015-05-25 | Discharge: 2015-05-25 | Disposition: A | Discharge status: Home / Self Care | Payer: Self-pay | Attending: Emergency Medicine | Admitting: Emergency Medicine

## 2015-05-25 ENCOUNTER — Emergency Department (HOSPITAL_COMMUNITY): Payer: Self-pay

## 2015-05-25 ENCOUNTER — Encounter (HOSPITAL_COMMUNITY): Payer: Self-pay | Admitting: Neurology

## 2015-05-25 DIAGNOSIS — Z79899 Other long term (current) drug therapy: Secondary | ICD-10-CM | POA: Insufficient documentation

## 2015-05-25 DIAGNOSIS — R52 Pain, unspecified: Secondary | ICD-10-CM

## 2015-05-25 DIAGNOSIS — Z7982 Long term (current) use of aspirin: Secondary | ICD-10-CM | POA: Insufficient documentation

## 2015-05-25 DIAGNOSIS — Z9889 Other specified postprocedural states: Secondary | ICD-10-CM | POA: Insufficient documentation

## 2015-05-25 DIAGNOSIS — I10 Essential (primary) hypertension: Secondary | ICD-10-CM | POA: Insufficient documentation

## 2015-05-25 DIAGNOSIS — M25422 Effusion, left elbow: Secondary | ICD-10-CM | POA: Insufficient documentation

## 2015-05-25 DIAGNOSIS — J449 Chronic obstructive pulmonary disease, unspecified: Secondary | ICD-10-CM | POA: Insufficient documentation

## 2015-05-25 DIAGNOSIS — Z720 Tobacco use: Secondary | ICD-10-CM | POA: Insufficient documentation

## 2015-05-25 DIAGNOSIS — I252 Old myocardial infarction: Secondary | ICD-10-CM | POA: Insufficient documentation

## 2015-05-25 DIAGNOSIS — I4891 Unspecified atrial fibrillation: Secondary | ICD-10-CM | POA: Insufficient documentation

## 2015-05-25 LAB — CBC WITH DIFFERENTIAL/PLATELET
Basophils Absolute: 0.1 10*3/uL (ref 0.0–0.1)
Basophils Relative: 1 % (ref 0–1)
Eosinophils Absolute: 0.3 10*3/uL (ref 0.0–0.7)
Eosinophils Relative: 3 % (ref 0–5)
HCT: 39.5 % (ref 39.0–52.0)
Hemoglobin: 13.3 g/dL (ref 13.0–17.0)
LYMPHS ABS: 3 10*3/uL (ref 0.7–4.0)
LYMPHS PCT: 34 % (ref 12–46)
MCH: 29.4 pg (ref 26.0–34.0)
MCHC: 33.7 g/dL (ref 30.0–36.0)
MCV: 87.2 fL (ref 78.0–100.0)
Monocytes Absolute: 0.9 10*3/uL (ref 0.1–1.0)
Monocytes Relative: 10 % (ref 3–12)
NEUTROS ABS: 4.8 10*3/uL (ref 1.7–7.7)
Neutrophils Relative %: 52 % (ref 43–77)
Platelets: 314 10*3/uL (ref 150–400)
RBC: 4.53 MIL/uL (ref 4.22–5.81)
RDW: 14.8 % (ref 11.5–15.5)
WBC: 9 10*3/uL (ref 4.0–10.5)

## 2015-05-25 LAB — BASIC METABOLIC PANEL
Anion gap: 6 (ref 5–15)
BUN: 12 mg/dL (ref 6–20)
CO2: 26 mmol/L (ref 22–32)
CREATININE: 1.34 mg/dL — AB (ref 0.61–1.24)
Calcium: 9.4 mg/dL (ref 8.9–10.3)
Chloride: 109 mmol/L (ref 101–111)
GFR calc non Af Amer: >60 mL/min (ref >60)
Glucose, Bld: 100 mg/dL — ABNORMAL HIGH (ref 65–99)
POTASSIUM: 3.8 mmol/L (ref 3.5–5.1)
Sodium: 141 mmol/L (ref 135–145)

## 2015-05-25 LAB — SYNOVIAL CELL COUNT + DIFF, W/ CRYSTALS
Crystals, Fluid: NONE SEEN
Lymphocytes-Synovial Fld: 5 % (ref 0–20)
Monocyte-Macrophage-Synovial Fluid: 20 % — ABNORMAL LOW (ref 50–90)
NEUTROPHIL, SYNOVIAL: 75 % — AB (ref 0–25)
WBC, Synovial: 6360 /mm3 — ABNORMAL HIGH (ref 0–200)

## 2015-05-25 MED ORDER — ONDANSETRON HCL 4 MG/2ML IJ SOLN
4.0000 mg | Freq: Once | INTRAMUSCULAR | Status: AC
  Administered 2015-05-25: 4 mg via INTRAVENOUS
  Filled 2015-05-25: qty 2

## 2015-05-25 MED ORDER — LIDOCAINE-EPINEPHRINE-TETRACAINE (LET) SOLUTION
3.0000 mL | Freq: Once | NASAL | Status: AC
  Administered 2015-05-25: 3 mL via TOPICAL
  Filled 2015-05-25: qty 3

## 2015-05-25 MED ORDER — OXYCODONE-ACETAMINOPHEN 5-325 MG PO TABS: ORAL_TABLET | ORAL | Status: DC

## 2015-05-25 MED ORDER — OXYCODONE-ACETAMINOPHEN 5-325 MG PO TABS
1.0000 | ORAL_TABLET | Freq: Once | ORAL | Status: AC
  Administered 2015-05-25: 1 via ORAL
  Filled 2015-05-25: qty 1

## 2015-05-25 MED ORDER — LIDOCAINE-EPINEPHRINE (PF) 2 %-1:200000 IJ SOLN
20.0000 mL | Freq: Once | INTRAMUSCULAR | Status: AC
  Administered 2015-05-25: 20 mL
  Filled 2015-05-25: qty 20

## 2015-05-25 MED ORDER — MORPHINE SULFATE 4 MG/ML IJ SOLN
4.0000 mg | Freq: Once | INTRAMUSCULAR | Status: AC
  Administered 2015-05-25: 4 mg via INTRAVENOUS
  Filled 2015-05-25: qty 1

## 2015-05-25 NOTE — Discharge Instructions (Signed)
Take percocet for breakthrough pain, do not drink alcohol, drive, care for children or do other critical tasks while taking percocet.  Only use the arm sling for up to 2 days. Take the arm out and rotate the shoulder every 4 hours.    Do not hesitate to return to the emergency room for any new, worsening or concerning symptoms.  Please obtain primary care using resource guide below. Let them know that you were seen in the emergency room and that they will need to obtain records for further outpatient management.    Elbow Effusion You have an elbow injury with an effusion. This means there is blood or other fluid in the elbow joint. Both fractures and sprains of the elbow cab cause an effusion with swelling and pain. X-rays often show this swelling around the joint, but they may not show a fracture. The treatment for elbow sprains and minor fractures is to reduce swelling and pain. It rests the joint until movement is painless. Repeating the x-ray study in 1-2 weeks may show a minor fracture of the radius bone that was not visible on the initial x-rays. Most of the time a splint or sling is used for the first days or week after the injury. Apply ice packs to the elbow for 20-30 minutes every 2 hours for the next 2-3 days. Keep your elbow elevated above the level of your heart as much as possible until the pain and swelling are better. An elastic wrap may also be used to reduce swelling. Call your caregiver for follow-up care within one week.  The major issue with this condition is loss of elbow motion. In general, your caregiver will start you on motion exercises and may have you follow-up with a physical or hand therapist. SEEK MEDICAL CARE IF:   You develop a numb, cold, or pale forearm or hand. Document Released: 12/06/2004 Document Revised: 01/21/2012 Document Reviewed: 04/26/2009 [facility] Patient Information 2015 [place], [place]. This information is not intended to replace advice given to you  by your health care provider. Make sure you discuss any questions you have with your health care provider.  Emergency Department Resource Guide 1) Find a Doctor and Pay Out of Pocket Although you won't have to find out who is covered by your insurance plan, it is a good idea to ask around and get recommendations. You will then need to call the office and see if the doctor you have chosen will accept you as a new patient and what types of options they offer for patients who are self-pay. Some doctors offer discounts or will set up payment plans for their patients who do not have insurance, but you will need to ask so you aren't surprised when you get to your appointment.  2) Contact Your Local Health Department Not all health departments have doctors that can see patients for sick visits, but many do, so it is worth a call to see if yours does. If you don't know where your local health department is, you can check in your phone book. The CDC also has a tool to help you locate your state's health department, and many state websites also have listings of all of their local health departments.  3) Find a Walk-in Clinic If your illness is not likely to be very severe or complicated, you may want to try a walk in clinic. These are popping up all over the country in pharmacies, drugstores, and shopping centers. They're usually staffed by nurse practitioners or  physician assistants that have been trained to treat common illnesses and complaints. They're usually fairly quick and inexpensive. However, if you have serious medical issues or chronic medical problems, these are probably not your best option.  No Primary Care Doctor: - Call Health Connect at  [phone] - they can help you locate a primary care doctor that  accepts your insurance, provides certain services, etc. - Physician Referral Service- [phone]  Chronic Pain Problems: Organization         Address  Phone   Notes  [facility]  [phone] Patients need to be referred by their primary care doctor.   Medication Assistance: Organization         Address  Phone   Notes  [facility] [address] [place], [place] [address] [phone] --Must be a resident of [facility] -- Must have NO insurance coverage whatsoever (no Medicaid/ Medicare, etc.) -- The pt. MUST have a primary care doctor that directs their care regularly and follows them in the community   Med[facility]ist  [phone]   [employer]  [phone]    Agencies that provide inexpensive medical care: Organization         Address  Phone   Notes  [facility]  [phone]   [facility]    [phone]   [facility] [address] [place], [place] [address] [phone]   [facility] [address], [place] [phone]   [facility]    [phone]   [facility]    [phone]   [facility]  [address], [place] Phone:  [phone], Fax:  [phone] Hours of Operation:  9 am - 6 pm, M-F.  Also accepts Medicaid/Medicare and self-pay.  [facility]  [address], [place] Phone: [phone], Fax: [phone]. Hours of Operation:  8:30 am - 5:30 pm, M-F.  Also accepts Medicaid and self-pay.  [facility] [place] [address], [place] Phone: [phone]   [facility] [address], [place], [place] [phone], Ext. [phone] Mondays & Thursdays: 7-9 AM.  First 15 patients are seen on a first come, first serve basis.    Medicaid-accepting [facility] Providers:  Organization         Address  Phone   Notes  [facility] [address], [place] [phone] Also accepts self-pay patients.  [facility] [address],  [place]  [phone]   [facility] [address], [place] [phone]   [facility] [address] [place] [address], [place] [phone]   Renaye RakersVeita Bland [address], [place]   [phone] Only accepts [place] Access [place] patients after they have their name applied to their card.   Self-Pay (no insurance) in [facility]:  Organization         Address  Phone   Notes  Sickle Cell Patients, [facility] [address], [place] [phone]   [facility] [address], [place] [phone]   [facility] [place]  [address], [place] [phone]   [facility]/Dr. Julio Sickssei-Bonsu  [address]  [place] [address], [place] or [address], [place] [phone] Phone number for both [place] and [place] locations is the same.  °[facility] [address], [place] [phone]   °[facility] [address] [place] [address], [place] or [address] [phone] °([phone]   °[facility] [address], [place] [phone], phone; [phone], fax Sees patients 1st and 3rd Saturday of every month.  Must not qualify for public or private insurance (i.e. Medicaid, Medicare, [place] Health Choice, Veterans' Benefits) • Household income should be no more than 200% of the poverty level •The clinic cannot treat you if you are pregnant or think you are pregnant • Sexually transmitted diseases are not treated at the clinic.  ° ° °Dental Care: °Organization         Address  Phone  Notes  °[facility] [address], [place] [phone] Accepts children up to age 21 who are enrolled in Medicaid or [place] Health Choice; pregnant women with a Medicaid card; and children who have applied for Medicaid or [place] Health  Choice, but were declined, whose parents can pay a reduced fee at time of service.  °[facility] [place]  [address], [place] [phone] Accepts children up to age 21 who are enrolled in Medicaid or [place] Health Choice; pregnant women with a Medicaid card; and children who have applied for Medicaid or [place] Health Choice, but were declined, whose parents can pay a reduced fee at time of service.  °[facility] ° [address], [place] [phone] Patients are seen by appointment only. Walk-ins are not accepted. [facility] will see patients 18 years of age and older. °Monday - Tuesday (8am-5pm) °Most Wednesdays (8:30-5pm) °$30 per visit, cash only  °[facility] ° [address], [place] [phone] Patients are seen by appointment only. Walk-ins are not accepted. [facility] will see patients 18 years of age and older. °One Wednesday Evening (Monthly: Volunteer Based).  $30 per visit, cash only  °[facility]  [phone] for adults; Children under age 4, call Graduate Pediatric Dentistry at [phone]. Children aged 4-14, please call [phone] to request a pediatric application. ° Dental services are provided in all areas of dental care including fillings, crowns and bridges, complete and partial dentures, implants, gum treatment, root canals, and extractions. Preventive care is also provided. Treatment is provided to both adults and children. °Patients are selected via a lottery and there is often a waiting list. °  °[facility] [address], °[place] ° [phone]) [phone] [email or web address] °  °[facility] [address], [place], [place] [phone], Ext. [phone] Second and Fourth Thursday of each month, opens at 6:30 AM; Clinic ends at 9 AM.  Patients are seen on a first-come first-served basis, and a limited number are seen during each  clinic.  ° °[facility] ° [address], [place], [place] [phone]   Eligibility Requirements °You must have lived in [place], [place], or [place] counties for at least the last three months. °  You cannot be eligible for state or federal sponsored healthcare insurance, including Veterans Administration, Medicaid, or Medicare. °  You generally cannot be eligible for healthcare insurance through your employer.  °    How to apply: °Eligibility screenings are held every Tuesday and Wednesday afternoon from 1:00 pm until 4:00 pm. You do not need an appointment for the interview!  °[facility] [address], [place], [place] [phone]   °[facility]  [phone]   °[facility]  336-703-3100   °[facility]  336-570-6415   ° °Behavioral Health Resources in the Community: °Intensive Outpatient Programs °Organization         Address  Phone  Notes  °[facility] [place] [facility] [address], [place], [place] [phone]   °[facility] [address], [place], [place] [phone]   °[facility] [address], [place], [place] ° 336-882-2125   °[facility] [address],  °[place], [place] 1-800-853-5163 or 336-641-4981   °Substance Abuse Resources °Organization         Address  Phone  Notes  °[facility]  336-882-2125   °[facility]  336-784-9470   °[facility]  336-285-9073   °[facility]  336-845-3988   °Residential & Outpatient Substance Abuse Program  1-800-659-3381   °Psychological Services °Organization         Address  Phone  Notes  °[facility]  336- [phone]   °[facility]  336- 378-7881   °[facility] [address], [place] 1-800-853-5163 or 336-641-4981   ° °Mobile Crisis Teams °Organization         Address  Phone  Notes  °[facility], Mobile  Crisis Care Unit  1-877-626-1772   °Assertive °[facility] Services ° [address] [place], [place] 336-834-9664   °Sharon DeEsch [address] °[place] 336-554-5454   ° °Self-Help/Support Groups °Organization         Address  Phone             Notes  °[facility] [place] - variety of support groups  336- 373-1402 Call for more information  °Narcotics Anonymous (NA), [facility] [address], °[place]  2 meetings at this location  ° °Residential Treatment Programs °Organization         Address  Phone  Notes  °[facility] [address],    °[place]  1-866-801-8205   °[facility] ° [address], [place], [place] [phone]   °[facility] [address], [place] 336-845-3988 Admissions: 8am-3pm M-F  °[facility] [address]    °[place], [place] 336-841-1104   °[facility] [address], [place], [place] 336-379-7146   °[facility] [address]  °[place], [place] 336-285-9073   °[facility] Intensive Outpatient [address], [place], [place] 336-852-3033   °[facility] [address]  °[place], [place] 1-877-615-2722 or 336-784-9470   °[facility] ([facility]) [address] [place], [place] 336-227-7417 Accepts Medicaid  °[facility] [address]  °  1-800-659-3381 Substance Abuse/Addiction Treatment  ° °[place] [facility] Health Resources °Organization         Address  Phone  Notes  °[facility]  (888) 581-9988   °Julie Brannon, PhD [address] [place],    (336) 349-5553 or (336) 951-0000   °[facility]   [address] °[place],  (336) 349-4454   °[facility] [address],   [place] [phone] Insurance/Medicaid/sponsorship through [facility] [address]                                    [place], [place] [phone] Therapy/tele-psych/case  [facility] [address]   [place], [place] [phone]    Dr. Lolly Mustache  [phone]   [facility]  [employer] [facility] 1) [address] [place] 2) [address] [place] 3)  [address], [place] [phone]  [phone]   [facility] Child Abuse Hotline [phone] or [phone] (After Hours)

## 2015-05-25 NOTE — ED Notes (Signed)
Patient transported to X-ray 

## 2015-05-25 NOTE — ED Notes (Signed)
Pt reports left arm has been "locked" since Saturday at the elbow. 8 years ago had elbow surgery. Denies recent injury. Swelling to elbow noted, but cannot straighten. Sensation intact

## 2015-05-25 NOTE — ED Provider Notes (Signed)
CSN: [id number]     Arrival date & time 05/25/15  1116 History   First MD Initiated Contact with Patient 05/25/15 1128     Chief Complaint  Patient presents with  . Arm Injury     (Consider location/radiation/quality/duration/timing/severity/associated sxs/prior Treatment) HPI   Blood pressure 120/69, pulse 61, temperature 97.8 F (36.6 C), temperature source Oral, resp. rate 20, height  (1.88 m), weight 265 lb (120.203 kg), SpO2 100 %.  Jason Romero is a 44 y.o. male complaining of acute onset of left elbow locking with decreased range of motion in extension and flexion with severe pain onset 5 days ago. Patient is taking no pain medication prior to arrival, there was no trauma prior to the sensation. He has history of remote left elbow surgery states that he shattered his radial head. This was performed at [facility], he has not followed with any orthopedist in the meantime. Left-hand dominant. Rates his pain at 10 out of 10, exacerbated by movement and palpation. No pain medication taken prior to arrival. Denies fever, chills, skin trauma to the affected joint.  Past Medical History  Diagnosis Date  . Hypertension   . COPD (chronic obstructive pulmonary disease)   . MI (myocardial infarction)   . A-fib    Past Surgical History  Procedure Laterality Date  . Cholecystectomy     No family history on file. History  Substance Use Topics  . Smoking status: Current Every Day Smoker  . Smokeless tobacco: Not on file  . Alcohol Use: No    Review of Systems  10 systems reviewed and found to be negative, except as noted in the HPI.   Allergies  Hydrocodone and Ibuprofen  Home Medications   Prior to Admission medications   Medication Sig Start Date End Date Taking? Authorizing Provider  acetaminophen (TYLENOL) 500 MG tablet Take 1,000 mg by mouth every 6 (six) hours as needed for mild pain or headache.    Historical Provider, MD  ALPRAZolam Prudy Feeler) 0.25 MG tablet  Take 0.25 mg by mouth 4 (four) times daily as needed for anxiety.    Historical Provider, MD  aspirin EC 81 MG tablet Take 81 mg by mouth daily.    Historical Provider, MD  cloNIDine (CATAPRES) 0.1 MG tablet Take 0.1 mg by mouth 2 (two) times daily.    Historical Provider, MD  hydrALAZINE (APRESOLINE) 25 MG tablet Take 25 mg by mouth 3 (three) times daily.    Historical Provider, MD  hydrochlorothiazide (MICROZIDE) 12.5 MG capsule Take 12.5 mg by mouth daily.    Historical Provider, MD  lisinopril (PRINIVIL,ZESTRIL) 40 MG tablet Take 40 mg by mouth daily.    Historical Provider, MD  metoprolol (LOPRESSOR) 100 MG tablet Take 100 mg by mouth 2 (two) times daily.    Historical Provider, MD  nitroGLYCERIN (NITROSTAT) 0.4 MG SL tablet Place 0.4 mg under the tongue every 5 (five) minutes as needed for chest pain.    Historical Provider, MD   BP 120/69 mmHg  Pulse 61  Temp(Src) 97.8 F (36.6 C) (Oral)  Resp 20  Ht  (1.88 m)  Wt 265 lb (120.203 kg)  BMI 34.01 kg/m2  SpO2 100% Physical Exam  Constitutional: He is oriented to person, place, and time. He appears well-developed and well-nourished. No distress.  HENT:  Head: Normocephalic.  Mouth/Throat: Oropharynx is clear and moist.  Eyes: Conjunctivae and EOM are normal.  Cardiovascular: Normal rate, regular rhythm and intact distal pulses.   Pulmonary/Chest:  Effort normal and breath sounds normal. No stridor.  Abdominal: Soft.  Musculoskeletal: Normal range of motion. He exhibits edema and tenderness.  Patient holds the left elbow in partial flexion, not fully extend or fully flex the elbow, no new overlying skin changes, there is a remote surgical scar well-healed. No warmth or large effusion to the elbow. Patient cannot fully supinate the elbow.   Neurological: He is alert and oriented to person, place, and time.  Psychiatric: He has a normal mood and affect.  Nursing note and vitals reviewed.   ED Course  ARTHOCENTESIS Date/Time:  05/25/2015 2:52 PM Performed by: Wynetta EmeryPISCIOTTA, Ronnita Paz Authorized by: Wynetta EmeryPISCIOTTA, Gessica Jawad Consent: Verbal consent obtained. Consent given by: patient Required items: required blood products, implants, devices, and special equipment available Indications: joint swelling,  pain and possible septic joint  Body area: elbow Joint: left elbow Local anesthesia used: yes Anesthesia: local infiltration Local anesthetic: lidocaine 2% with epinephrine and LET (lido,epi,tetracaine) Anesthetic total: 2 ml Patient sedated: no Preparation: Patient was prepped and draped in the usual sterile fashion. Needle gauge: 20 G Ultrasound guidance: no Approach: medial Aspirate: blood-tinged Aspirate amount: 6 mL Patient tolerance: Patient tolerated the procedure well with no immediate complications   (including critical care time) Labs Review Labs Reviewed - No data to display  Imaging Review No results found.   EKG Interpretation None      MDM   Final diagnoses:  Pain  Elbow effusion, left    Filed Vitals:   05/25/15 1126  BP: 120/69  Pulse: 61  Temp: 97.8 F (36.6 C)  TempSrc: Oral  Resp: 20  Height: 6\' 2"  (1.88 m)  Weight: 265 lb (120.203 kg)  SpO2: 100%    Medications  oxyCODONE-acetaminophen (PERCOCET/ROXICET) 5-325 MG per tablet 1 tablet (not administered)    Vilma PraderGlenn H Carl is a pleasant 44 y.o. male presenting with atraumatic left elbow locking and severe pain onset 5 days ago. He is neurovascularly intact, there is no warmth to the joint, I doubt this is a septic joint.  X-ray shows a large elbow effusion. Discussed case with attending who recommends arthrocentesis. Performed with expression of 6 mL of blood-tinged fluid.  Cased signed out to PA Sanders at shift change plan is to f/u synovial fluid analysis.      Wynetta Emeryicole Raima Geathers, PA-C 05/25/15 1620  Purvis SheffieldForrest Harrison, MD 05/27/15 [phone]

## 2015-05-25 NOTE — ED Provider Notes (Signed)
Patient received in sign out from PA Pisciotta at shift change.  Briefly, 44 y.o. M here with atraumatic left elbow pain, arm NVI.  Some pain with ROM.  No fever, chills, or skin changes noted.  Effusion present on plain films so arthrocentesis performed.  Plan:  Synovial fluid panel pending.  Follow results.  Results for orders placed or performed during the hospital encounter of 05/25/15  Synovial cell count + diff, w/ crystals  Result Value Ref Range   Color, Synovial AMBER (A) YELLOW   Appearance-Synovial TURBID (A) CLEAR   Crystals, Fluid NO CRYSTALS SEEN    WBC, Synovial 6360 (H) 0 - 200 /cu mm   Neutrophil, Synovial 75 (H) 0 - 25 %   Lymphocytes-Synovial Fld 5 0 - 20 %   Monocyte-Macrophage-Synovial Fluid 20 (L) 50 - 90 %  CBC with Differential  Result Value Ref Range   WBC 9.0 4.0 - 10.5 K/uL   RBC 4.53 4.22 - 5.81 MIL/uL   Hemoglobin 13.3 13.0 - 17.0 g/dL   HCT 54.0 98.1 - 19.1 %   MCV 87.2 78.0 - 100.0 fL   MCH 29.4 26.0 - 34.0 pg   MCHC 33.7 30.0 - 36.0 g/dL   RDW 47.8 29.5 - 62.1 %   Platelets 314 150 - 400 K/uL   Neutrophils Relative % 52 43 - 77 %   Neutro Abs 4.8 1.7 - 7.7 K/uL   Lymphocytes Relative 34 12 - 46 %   Lymphs Abs 3.0 0.7 - 4.0 K/uL   Monocytes Relative 10 3 - 12 %   Monocytes Absolute 0.9 0.1 - 1.0 K/uL   Eosinophils Relative 3 0 - 5 %   Eosinophils Absolute 0.3 0.0 - 0.7 K/uL   Basophils Relative 1 0 - 1 %   Basophils Absolute 0.1 0.0 - 0.1 K/uL  Basic metabolic panel  Result Value Ref Range   Sodium 141 135 - 145 mmol/L   Potassium 3.8 3.5 - 5.1 mmol/L   Chloride 109 101 - 111 mmol/L   CO2 26 22 - 32 mmol/L   Glucose, Bld 100 (H) 65 - 99 mg/dL   BUN 12 6 - 20 mg/dL   Creatinine, Ser 3.08 (H) 0.61 - 1.24 mg/dL   Calcium 9.4 8.9 - 65.7 mg/dL   GFR calc non Af Amer >60 >60 mL/min   GFR calc Af Amer >60 >60 mL/min   Anion gap 6 5 - 15   Dg Elbow Complete Left  05/25/2015   CLINICAL DATA:  Elbow pain and locked elbow.  Prior injury.  EXAM:  LEFT ELBOW - COMPLETE 3+ VIEW  COMPARISON:  02/24/2010  FINDINGS: There is deformity of the radial head related to the old fracture. Surgical screw in the distal humerus. Small bone fragments or loose bodies on both sides of the elbow. There is no evidence for an acute fracture. Elbow appears to be located but there is concern for a posterior fat pad sign and an elbow joint effusion.  IMPRESSION: Chronic deformities in the left elbow compatible with previous injury. Although there is no evidence for an acute fracture, there is concern for an elbow joint effusion.  Loose bodies in the elbow and this could be contributing to the patient's symptoms.   Electronically Signed   By: Richarda Overlie M.D.   On: 05/25/2015 12:45   Synovial fluid results as above.  Discussed results with attending physician, Dr. Romeo Apple-- does not feel this represents septic joint, does not recommend abx at  this time.  Culture pending.  Will d/c home with pain meds per PA Pisciotta.  FU with hand surgery.  Discussed plan with patient, he/she acknowledged understanding and agreed with plan of care.  Return precautions given for new or worsening symptoms.  Garlon HatchetLisa M Samnang Shugars, PA-C 05/25/15 2121  Purvis SheffieldForrest Harrison, MD 05/27/15 [phone]

## 2015-05-29 LAB — BODY FLUID CULTURE: CULTURE: NO GROWTH

## 2015-11-09 ENCOUNTER — Emergency Department (HOSPITAL_COMMUNITY)
Admission: EM | Admit: 2015-11-09 | Discharge: 2015-11-09 | Disposition: A | Discharge status: Home / Self Care | Payer: BLUE CROSS/BLUE SHIELD | Attending: Emergency Medicine | Admitting: Emergency Medicine

## 2015-11-09 ENCOUNTER — Emergency Department (HOSPITAL_COMMUNITY): Payer: BLUE CROSS/BLUE SHIELD

## 2015-11-09 ENCOUNTER — Encounter (HOSPITAL_COMMUNITY): Payer: Self-pay | Admitting: *Deleted

## 2015-11-09 DIAGNOSIS — J449 Chronic obstructive pulmonary disease, unspecified: Secondary | ICD-10-CM | POA: Diagnosis not present

## 2015-11-09 DIAGNOSIS — F17200 Nicotine dependence, unspecified, uncomplicated: Secondary | ICD-10-CM | POA: Insufficient documentation

## 2015-11-09 DIAGNOSIS — W19XXXA Unspecified fall, initial encounter: Secondary | ICD-10-CM

## 2015-11-09 DIAGNOSIS — Y9389 Activity, other specified: Secondary | ICD-10-CM | POA: Insufficient documentation

## 2015-11-09 DIAGNOSIS — I252 Old myocardial infarction: Secondary | ICD-10-CM | POA: Diagnosis not present

## 2015-11-09 DIAGNOSIS — S20229A Contusion of unspecified back wall of thorax, initial encounter: Secondary | ICD-10-CM

## 2015-11-09 DIAGNOSIS — I10 Essential (primary) hypertension: Secondary | ICD-10-CM | POA: Insufficient documentation

## 2015-11-09 DIAGNOSIS — S300XXA Contusion of lower back and pelvis, initial encounter: Secondary | ICD-10-CM | POA: Insufficient documentation

## 2015-11-09 DIAGNOSIS — W1839XA Other fall on same level, initial encounter: Secondary | ICD-10-CM | POA: Diagnosis not present

## 2015-11-09 DIAGNOSIS — Y998 Other external cause status: Secondary | ICD-10-CM | POA: Insufficient documentation

## 2015-11-09 DIAGNOSIS — Z79899 Other long term (current) drug therapy: Secondary | ICD-10-CM | POA: Diagnosis not present

## 2015-11-09 DIAGNOSIS — Z7982 Long term (current) use of aspirin: Secondary | ICD-10-CM | POA: Diagnosis not present

## 2015-11-09 DIAGNOSIS — Y9289 Other specified places as the place of occurrence of the external cause: Secondary | ICD-10-CM | POA: Insufficient documentation

## 2015-11-09 DIAGNOSIS — S3992XA Unspecified injury of lower back, initial encounter: Secondary | ICD-10-CM | POA: Diagnosis present

## 2015-11-09 MED ORDER — OXYCODONE-ACETAMINOPHEN 5-325 MG PO TABS
1.0000 | ORAL_TABLET | Freq: Once | ORAL | Status: AC
  Administered 2015-11-09: 1 via ORAL
  Filled 2015-11-09: qty 1

## 2015-11-09 MED ORDER — TRAMADOL HCL 50 MG PO TABS: 50.0000 mg | ORAL_TABLET | Freq: Four times a day (QID) | ORAL | Status: DC | PRN

## 2015-11-09 MED ORDER — CYCLOBENZAPRINE HCL 10 MG PO TABS: 5.0000 mg | ORAL_TABLET | Freq: Two times a day (BID) | ORAL | Status: DC | PRN

## 2015-11-09 MED ORDER — OXYCODONE-ACETAMINOPHEN 5-325 MG PO TABS: 1.0000 | ORAL_TABLET | Freq: Three times a day (TID) | ORAL | Status: DC | PRN

## 2015-11-09 NOTE — ED Provider Notes (Signed)
CSN: [id number]     Arrival date & time 11/09/15  1404 History  By signing my name below, I, Jason Romero, attest that this documentation has been prepared under the direction and in the presence of Marlon Pel, PA-C Electronically Signed: Charline Bills, ED Scribe 11/10/2015 at 3:57 PM.   Chief Complaint  Patient presents with  . Back Pain   The history is provided by the patient. No language interpreter was used.   HPI Comments: Jason Romero is a 44 y.o. male who presents to the Emergency Department complaining of a fall that occurred yesterday morning. Pt states that his left leg suddenly gave out when he first got out of the bed yesterday morning. Pt reports falling backwards and landing flat on hardwood floor. No head injury or LOC. He reports constant mid back pain since yesterday that is exacerbated with movement. Pt denies fever and urinary or bowel incontinence. He has tried resting and applying a heating pad without significant relief. No medications tried PTA. No known medical allergies.  PCP: Marcine Matar, MD PMH: hypertension, COPD, MI, and a.fib  ROS: The patient denies diaphoresis, fever, headache, weakness (general or focal), confusion, change of vision,  dysphagia, aphagia, shortness of breath,  abdominal pains, nausea, vomiting, diarrhea, lower extremity swelling, rash, neck pain, chest pain   Past Medical History  Diagnosis Date  . Hypertension   . COPD (chronic obstructive pulmonary disease) (HCC)   . MI (myocardial infarction) (HCC)   . A-fib [facility])    Past Surgical History  Procedure Laterality Date  . Cholecystectomy     No family history on file. Social History  Substance Use Topics  . Smoking status: Current Every Day Smoker -- 1.00 packs/day  . Smokeless tobacco: None  . Alcohol Use: No    Review of Systems  Musculoskeletal: Positive for back pain.  All other systems reviewed and are negative.  Allergies  Hydrocodone and  Ibuprofen  Home Medications   Prior to Admission medications   Medication Sig Start Date End Date Taking? Authorizing Provider  acetaminophen (TYLENOL) 500 MG tablet Take 1,000 mg by mouth every 6 (six) hours as needed for mild pain or headache.   Yes Historical Provider, MD  aspirin EC 81 MG tablet Take 81 mg by mouth daily.   Yes Historical Provider, MD  cloNIDine (CATAPRES) 0.1 MG tablet Take 0.2 mg by mouth 2 (two) times daily.    Yes Historical Provider, MD  hydrALAZINE (APRESOLINE) 50 MG tablet Take 50 mg by mouth 3 (three) times daily.   Yes Historical Provider, MD  hydrochlorothiazide (MICROZIDE) 12.5 MG capsule Take 12.5 mg by mouth daily.   Yes Historical Provider, MD  lisinopril (PRINIVIL,ZESTRIL) 40 MG tablet Take 40 mg by mouth daily.   Yes Historical Provider, MD  metoprolol (LOPRESSOR) 100 MG tablet Take 100 mg by mouth 2 (two) times daily.   Yes Historical Provider, MD  oxyCODONE-acetaminophen (PERCOCET/ROXICET) 5-325 MG per tablet 1 to 2 tabs PO q6hrs  PRN for pain Patient taking differently: Take 1 tablet by mouth every 4 (four) hours as needed for moderate pain.  05/25/15  Yes Nicole Pisciotta, PA-C  pantoprazole (PROTONIX) 40 MG tablet Take 40 mg by mouth daily.   Yes Historical Provider, MD  ALPRAZolam Prudy Feeler) 1 MG tablet Take 1 mg by mouth 3 (three) times daily.    Historical Provider, MD  cyclobenzaprine (FLEXERIL) 10 MG tablet Take 0.5-1 tablets (5-10 mg total) by mouth 2 (two) times daily as  needed. 11/09/15   Skylynne Schlechter Neva SeatGreene, PA-C  nitroGLYCERIN (NITROSTAT) 0.4 MG SL tablet Place 0.4 mg under the tongue every 5 (five) minutes as needed for chest pain.    Historical Provider, MD  oxyCODONE-acetaminophen (PERCOCET/ROXICET) 5-325 MG tablet Take 1 tablet by mouth every 8 (eight) hours as needed for severe pain. 11/09/15   [name] Neva SeatGreene, PA-C   BP 131/88 mmHg  Pulse 56  Temp(Src) 98 F (36.7 C) (Oral)  Resp 16  Ht 6\' 2"  (1.88 m)  Wt 265 lb (120.203 kg)  BMI 34.01 kg/m2   SpO2 96% Physical Exam  Constitutional: He is oriented to person, place, and time. He appears well-developed and well-nourished. No distress.  HENT:  Head: Normocephalic and atraumatic.  Eyes: Conjunctivae and EOM are normal.  Neck: Neck supple. No tracheal deviation present.  Cardiovascular: Normal rate.   Pulmonary/Chest: Effort normal. No respiratory distress.  Musculoskeletal: Normal range of motion.  Symmetrical and physiologic strength to bilateral lower extremities.  Neurosensory function adequate to both legs Skin color is normal. Skin is warm and moist.  No step off deformity appreciated and no midline bony tenderness.  Ambulatory  No crepitus, laceration, effusion, induration, lesions Pedal pulses are symmetrical and palpable bilaterally  Tenderness to palpation of paraspinal and midline of spine No clonus on dorsiflextion   Neurological: He is alert and oriented to person, place, and time.  Skin: Skin is warm and dry.  Psychiatric: He has a normal mood and affect. His behavior is normal.  Nursing note and vitals reviewed.  ED Course  Procedures (including critical care time) DIAGNOSTIC STUDIES: Oxygen Saturation is 96% on RA, normal by my interpretation.    COORDINATION OF CARE: 3:56 PM-Discussed treatment plan which includes XR with pt at bedside and pt agreed to plan.   Labs Review Labs Reviewed - No data to display  Imaging Review Dg Lumbar Spine Complete  11/09/2015  CLINICAL DATA:  Midline back pain, fell last night from standing position directly onto a hard floor, initial encounter EXAM: LUMBAR SPINE - COMPLETE 4+ VIEW COMPARISON:  10/14/2012 FINDINGS: 5 non-rib-bearing lumbar vertebra. Osseous mineralization grossly normal for technique. Vertebral body and disc space heights maintained. No acute fracture, subluxation, or bone destruction. Tiny superior endplate spur at L4 unchanged. No spondylolysis. SI joints symmetric. Scattered atherosclerotic  calcifications. IMPRESSION: No acute lumbar spine abnormalities. Electronically Signed   By: Ulyses SouthwardMark  Boles M.D.   On: 11/09/2015 16:32   I have personally reviewed and evaluated these images and lab results as part of my medical decision-making.   EKG Interpretation None      MDM   Final diagnoses:  Fall, initial encounter  Back contusion, unspecified laterality, initial encounter   Patient with back pain.  No neurological deficits and normal neuro exam.  Patient is ambulatory.  No loss of bowel or bladder control.  No concern for cauda equina.  No fever, night sweats, weight loss, h/o cancer, IVDA, no recent procedure to back. No urinary symptoms suggestive of UTI.  Supportive care and return precaution discussed. Appears safe for discharge at this time. Follow up as indicated in discharge paperwork.   Rx: pain medication and muscle relaxers.  Patient/guardian has voiced understanding and agreed to follow-up with the PCP or specialist.        Marlon Peliffany Norva Bowe, PA-C 11/10/15 [id number]  Rolland PorterMark James, MD 11/19/15 [phone]

## 2015-11-09 NOTE — Discharge Instructions (Signed)

## 2015-11-09 NOTE — ED Notes (Signed)
Pt from home for back pain, states he always has back pain but fell yesterday after leg gave out and fell onto right side. Pt states tried heating pad with no relief. Denies any numbness or tingling, denies any urinary or bladder incontinence. nad noted. Ambulatory with steady gait.

## 2016-02-01 DIAGNOSIS — J449 Chronic obstructive pulmonary disease, unspecified: Secondary | ICD-10-CM | POA: Insufficient documentation

## 2016-02-01 DIAGNOSIS — I480 Paroxysmal atrial fibrillation: Secondary | ICD-10-CM | POA: Insufficient documentation

## 2016-02-01 DIAGNOSIS — K219 Gastro-esophageal reflux disease without esophagitis: Secondary | ICD-10-CM | POA: Insufficient documentation

## 2016-02-02 ENCOUNTER — Emergency Department
Admission: EM | Admit: 2016-02-02 | Discharge: 2016-02-02 | Disposition: A | Discharge status: Home / Self Care | Payer: BLUE CROSS/BLUE SHIELD | Attending: Emergency Medicine | Admitting: Emergency Medicine

## 2016-02-02 ENCOUNTER — Encounter: Payer: Self-pay | Admitting: Family Medicine

## 2016-02-02 ENCOUNTER — Emergency Department: Payer: BLUE CROSS/BLUE SHIELD

## 2016-02-02 ENCOUNTER — Encounter: Payer: Self-pay | Admitting: Emergency Medicine

## 2016-02-02 ENCOUNTER — Other Ambulatory Visit: Payer: Self-pay | Admitting: Surgical

## 2016-02-02 ENCOUNTER — Ambulatory Visit: Payer: BLUE CROSS/BLUE SHIELD | Admitting: Family Medicine

## 2016-02-02 DIAGNOSIS — I213 ST elevation (STEMI) myocardial infarction of unspecified site: Secondary | ICD-10-CM | POA: Insufficient documentation

## 2016-02-02 DIAGNOSIS — F17200 Nicotine dependence, unspecified, uncomplicated: Secondary | ICD-10-CM | POA: Diagnosis not present

## 2016-02-02 DIAGNOSIS — Z7982 Long term (current) use of aspirin: Secondary | ICD-10-CM | POA: Diagnosis not present

## 2016-02-02 DIAGNOSIS — I119 Hypertensive heart disease without heart failure: Secondary | ICD-10-CM | POA: Insufficient documentation

## 2016-02-02 DIAGNOSIS — I4891 Unspecified atrial fibrillation: Secondary | ICD-10-CM | POA: Insufficient documentation

## 2016-02-02 DIAGNOSIS — Z79899 Other long term (current) drug therapy: Secondary | ICD-10-CM | POA: Insufficient documentation

## 2016-02-02 DIAGNOSIS — R079 Chest pain, unspecified: Secondary | ICD-10-CM

## 2016-02-02 DIAGNOSIS — J449 Chronic obstructive pulmonary disease, unspecified: Secondary | ICD-10-CM | POA: Insufficient documentation

## 2016-02-02 DIAGNOSIS — M25512 Pain in left shoulder: Secondary | ICD-10-CM | POA: Diagnosis present

## 2016-02-02 LAB — COMPREHENSIVE METABOLIC PANEL
ALBUMIN: 3.9 g/dL (ref 3.5–5.0)
ALT: 25 U/L (ref 17–63)
AST: 28 U/L (ref 15–41)
Alkaline Phosphatase: 62 U/L (ref 38–126)
Anion gap: 6 (ref 5–15)
BUN: 18 mg/dL (ref 6–20)
CHLORIDE: 111 mmol/L (ref 101–111)
CO2: 20 mmol/L — ABNORMAL LOW (ref 22–32)
Calcium: 8.8 mg/dL — ABNORMAL LOW (ref 8.9–10.3)
Creatinine, Ser: 1.21 mg/dL (ref 0.61–1.24)
GFR calc Af Amer: >60 mL/min (ref >60)
GFR calc non Af Amer: >60 mL/min (ref >60)
GLUCOSE: 92 mg/dL (ref 65–99)
POTASSIUM: 4.3 mmol/L (ref 3.5–5.1)
Sodium: 137 mmol/L (ref 135–145)
Total Bilirubin: 0.5 mg/dL (ref 0.3–1.2)
Total Protein: 6.8 g/dL (ref 6.5–8.1)

## 2016-02-02 LAB — FIBRIN DERIVATIVES D-DIMER (ARMC ONLY): Fibrin derivatives D-dimer (ARMC): 366 (ref 0–499)

## 2016-02-02 LAB — CBC
HCT: 35.4 % — ABNORMAL LOW (ref 40.0–52.0)
Hemoglobin: 12.1 g/dL — ABNORMAL LOW (ref 13.0–18.0)
MCH: 29.6 pg (ref 26.0–34.0)
MCHC: 34.2 g/dL (ref 32.0–36.0)
MCV: 86.7 fL (ref 80.0–100.0)
PLATELETS: 284 10*3/uL (ref 150–440)
RBC: 4.08 MIL/uL — ABNORMAL LOW (ref 4.40–5.90)
RDW: 15 % — AB (ref 11.5–14.5)
WBC: 7.7 10*3/uL (ref 3.8–10.6)

## 2016-02-02 LAB — TROPONIN I: Troponin I: <0.03 ng/mL (ref <0.031)

## 2016-02-02 MED ORDER — TRAMADOL HCL 50 MG PO TABS: 50.0000 mg | ORAL_TABLET | Freq: Four times a day (QID) | ORAL | Status: AC | PRN

## 2016-02-02 MED ORDER — IOPAMIDOL (ISOVUE-370) INJECTION 76%
100.0000 mL | Freq: Once | INTRAVENOUS | Status: AC | PRN
  Administered 2016-02-02: 100 mL via INTRAVENOUS

## 2016-02-02 NOTE — ED Notes (Signed)
[facility]: brought over from [facility] for weakness but never made it in building and EMS brought him here.  [facility]

## 2016-02-02 NOTE — Discharge Instructions (Signed)

## 2016-02-02 NOTE — ED Notes (Signed)
Patient returned from CT

## 2016-02-02 NOTE — ED Provider Notes (Signed)
[facility] Emergency Department Provider Note  ____________________________________________    I have reviewed the triage vital signs and the nursing notes.   HISTORY  Chief Complaint Near syncope   HPI Jason Romero is a 45 y.o. male who presents withcomplaints of near syncopal episode. Patient reports a history of hypertension COPD, MI, A. fib for which he takes metoprolol. He notes that he was just discharged [facility] today after having similar episode yesterday. He was out of town working, he Therapist, occupational. He reports he had chest pain during the last episode but no chest pain today. He does have discomfort in his left shoulder. He does have some pleurisy. No fevers or chills. He apparently had a negative stress test yesterday and negative enzymes     Past Medical History  Diagnosis Date  . Hypertension   . COPD (chronic obstructive pulmonary disease) (HCC)   . MI (myocardial infarction) (HCC)   . A-fib (HCC)     There are no active problems to display for this patient.   Past Surgical History  Procedure Laterality Date  . Cholecystectomy      Current Outpatient Rx  Name  Route  Sig  Dispense  Refill  . acetaminophen (TYLENOL) 500 MG tablet   Oral   Take 1,000 mg by mouth every 6 (six) hours as needed for mild pain or headache.         . ALPRAZolam (XANAX) 1 MG tablet   Oral   Take 1 mg by mouth 3 (three) times daily.         Marland Kitchen aspirin EC 81 MG tablet   Oral   Take 81 mg by mouth daily.         . cloNIDine (CATAPRES) 0.1 MG tablet   Oral   Take 0.2 mg by mouth 2 (two) times daily.          . cloNIDine (CATAPRES) 0.2 MG tablet   Oral   Take by mouth.         . cyclobenzaprine (FLEXERIL) 10 MG tablet   Oral   Take 0.5-1 tablets (5-10 mg total) by mouth 2 (two) times daily as needed.   20 tablet   0   . hydrALAZINE (APRESOLINE) 50 MG tablet   Oral   Take 50 mg by mouth 3 (three) times daily.          . hydrochlorothiazide (MICROZIDE) 12.5 MG capsule   Oral   Take 12.5 mg by mouth daily.         Marland Kitchen lisinopril (PRINIVIL,ZESTRIL) 40 MG tablet   Oral   Take 40 mg by mouth daily.         . metoprolol (LOPRESSOR) 100 MG tablet   Oral   Take 100 mg by mouth 2 (two) times daily.         . metoprolol (LOPRESSOR) 100 MG tablet   Oral   Take by mouth.         . nitroGLYCERIN (NITROSTAT) 0.4 MG SL tablet   Sublingual   Place 0.4 mg under the tongue every 5 (five) minutes as needed for chest pain.         Marland Kitchen oxyCODONE-acetaminophen (PERCOCET/ROXICET) 5-325 MG per tablet      1 to 2 tabs PO q6hrs  PRN for pain Patient taking differently: Take 1 tablet by mouth every 4 (four) hours as needed for moderate pain.    15 tablet   0   .  oxyCODONE-acetaminophen (PERCOCET/ROXICET) 5-325 MG tablet   Oral   Take 1 tablet by mouth every 8 (eight) hours as needed for severe pain.   10 tablet   0   . pantoprazole (PROTONIX) 40 MG tablet   Oral   Take 40 mg by mouth daily.           Allergies Hydrocodone and Ibuprofen  No family history on file.  Social History Social History  Substance Use Topics  . Smoking status: Current Every Day Smoker -- 1.00 packs/day  . Smokeless tobacco: Not on file  . Alcohol Use: No    Review of Systems  Constitutional: Negative for fever. Eyes: Negative for redness ENT: Negative for sore throat Cardiovascular: Negative for chest pain Respiratory: Positive for short of breath Gastrointestinal: Negative for abdominal pain Genitourinary: Negative for dysuria. Musculoskeletal: Negative for back pain. Skin: Negative for rash. Neurological: Negative for focal weakness Psychiatric: no anxiety    ____________________________________________   PHYSICAL EXAM:  VITAL SIGNS: ED Triage Vitals  Enc Vitals Group     BP --      Pulse --      Resp --      Temp --      Temp src --      SpO2 --      Weight --      Height --      Head  Cir --      Peak Flow --      Pain Score --      Pain Loc --      Pain Edu? --      Excl. in GC? --      Constitutional: Alert and oriented. Well appearing and in no distress.  Eyes: Conjunctivae are normal. No erythema or injection ENT   Head: Normocephalic and atraumatic.   Mouth/Throat: Mucous membranes are moist. Cardiovascular: Normal rate, regular rhythm. Normal and symmetric distal pulses are present in the upper extremities. No murmurs or rubs  Respiratory: Normal respiratory effort without tachypnea nor retractions. Breath sounds are clear and equal bilaterally.  Gastrointestinal: Soft and non-tender in all quadrants. No distention. There is no CVA tenderness. Genitourinary: deferred Musculoskeletal: Nontender with normal range of motion in all extremities. No lower extremity tenderness nor edema. Neurologic:  Normal speech and language. No gross focal neurologic deficits are appreciated. Skin:  Skin is warm, dry and intact. No rash noted. Psychiatric: Mood and affect are normal. Patient exhibits appropriate insight and judgment.  ____________________________________________    LABS (pertinent positives/negatives)  Labs Reviewed  CBC  COMPREHENSIVE METABOLIC PANEL  TROPONIN I  FIBRIN DERIVATIVES D-DIMER ([facility] ONLY)    ____________________________________________   EKG  ED ECG REPORT I, [name], the attending physician, personally viewed and interpreted this ECG.  Date: 02/02/2016  Rate: 54 Rhythm sinus bradycardia QRS Axis: normal Intervals: normal ST/T Wave abnormalities: normal Conduction Disturbances: none Narrative Interpretation: unremarkable   ____________________________________________    RADIOLOGY  CT angiography of the chest shows normal aorta  ____________________________________________   PROCEDURES  Procedure(s) performed: none  Critical Care  performed:none  ____________________________________________   INITIAL IMPRESSION / ASSESSMENT AND PLAN / ED COURSE  Pertinent labs & imaging results that were available during my care of the patient were reviewed by me and considered in my medical decision making (see chart for details).  Patient with left upper shoulder discomfort which is nonspecific. He had normal stress test yesterday and normal enzymes. I'll send d-dimer given complaint of mild pleurisy  CT angiography of aorta is normal. Patient is pain-free at this time. He is upset with me for not giving him more pain medication for what he describes as shoulder pain. Feel he is appropriate for discharge with outpatient follow-up  ____________________________________________   FINAL CLINICAL IMPRESSION(S) / ED DIAGNOSES  Final diagnoses:  Chest pain, unspecified chest pain type          Jene Everyobert Deldrick Linch, MD 02/02/16 1947

## 2016-02-02 NOTE — ED Notes (Signed)
At the time of discharge, patient seemed upset and he and his wife were yelling that they should have checked his arm instead of his heart.  Patient was very upset.  Patient instructed to follow up with his primary care physician and he walked out.  Patient had already disconnected himself from the monitor prior to my entering the room and so discharge vitals were not able to be obtained.  Patient did not sign.  No difficulty breathing noted, respiration were even and unlabored, patient alert and oriented, ambulatory with no apparent difficulty.

## 2016-02-02 NOTE — ED Notes (Signed)
States he has had shoulder pain that radiates all the way down his left arm.  States at times he gets short of breath and diaphoretic.

## 2016-02-02 NOTE — Progress Notes (Signed)
Patient was scheduled to see Dr. Birdie SonsSonnenberg is a new patient today. Patient developed severe right-sided chest and shoulder pain and was short of breath before entering the office. His wife was concerned about his status and came into the office for urgent medical attention. I was called to see the patient. Patient was lying in the car and was diaphoretic and had an increased respiratory rate. He appeared ill. He was complaining of right upper chest/shoulder pain.  Blood pressure was 128/102, oxygen saturation was 98%, and patient was bradycardic with a heart rate of 52.  I spoke with his wife who informed me that he was just hospitalized and underwent a stress test this morning which was negative.  Given the above, 911 was called. Patient was subsequently taken to the hospital for further evaluation and treatment.

## 2016-02-02 NOTE — ED Notes (Signed)
Patient transported to CT 

## 2016-02-07 ENCOUNTER — Encounter: Payer: Self-pay | Admitting: Family Medicine

## 2016-02-07 ENCOUNTER — Ambulatory Visit
Admission: RE | Admit: 2016-02-07 | Discharge: 2016-02-07 | Disposition: A | Discharge status: Home / Self Care | Payer: BLUE CROSS/BLUE SHIELD | Source: Ambulatory Visit | Attending: Family Medicine | Admitting: Family Medicine

## 2016-02-07 ENCOUNTER — Ambulatory Visit (INDEPENDENT_AMBULATORY_CARE_PROVIDER_SITE_OTHER): Payer: BLUE CROSS/BLUE SHIELD | Admitting: Family Medicine

## 2016-02-07 VITALS — BP 158/102 | HR 64 | Temp 97.8°F | Ht 73.5 in | Wt 243.0 lb

## 2016-02-07 DIAGNOSIS — M791 Myalgia: Secondary | ICD-10-CM

## 2016-02-07 DIAGNOSIS — I10 Essential (primary) hypertension: Secondary | ICD-10-CM

## 2016-02-07 DIAGNOSIS — I6529 Occlusion and stenosis of unspecified carotid artery: Secondary | ICD-10-CM | POA: Diagnosis not present

## 2016-02-07 DIAGNOSIS — R631 Polydipsia: Secondary | ICD-10-CM | POA: Diagnosis not present

## 2016-02-07 DIAGNOSIS — M25522 Pain in left elbow: Secondary | ICD-10-CM

## 2016-02-07 DIAGNOSIS — M792 Neuralgia and neuritis, unspecified: Secondary | ICD-10-CM

## 2016-02-07 DIAGNOSIS — R55 Syncope and collapse: Secondary | ICD-10-CM

## 2016-02-07 DIAGNOSIS — F419 Anxiety disorder, unspecified: Secondary | ICD-10-CM

## 2016-02-07 DIAGNOSIS — R079 Chest pain, unspecified: Secondary | ICD-10-CM | POA: Diagnosis not present

## 2016-02-07 DIAGNOSIS — M7918 Myalgia, other site: Secondary | ICD-10-CM

## 2016-02-07 DIAGNOSIS — G43109 Migraine with aura, not intractable, without status migrainosus: Secondary | ICD-10-CM

## 2016-02-07 LAB — COMPREHENSIVE METABOLIC PANEL
ALBUMIN: 4.4 g/dL (ref 3.5–5.2)
ALK PHOS: 67 U/L (ref 39–117)
ALT: 14 U/L (ref 0–53)
AST: 13 U/L (ref 0–37)
BUN: 11 mg/dL (ref 6–23)
CO2: 26 mEq/L (ref 19–32)
Calcium: 9.5 mg/dL (ref 8.4–10.5)
Chloride: 103 mEq/L (ref 96–112)
Creatinine, Ser: 1.06 mg/dL (ref 0.40–1.50)
GFR: 79.84 mL/min (ref >60.00)
GLUCOSE: 81 mg/dL (ref 70–99)
POTASSIUM: 4 meq/L (ref 3.5–5.1)
SODIUM: 136 meq/L (ref 135–145)
TOTAL PROTEIN: 7.3 g/dL (ref 6.0–8.3)
Total Bilirubin: 0.5 mg/dL (ref 0.2–1.2)

## 2016-02-07 LAB — HEMOGLOBIN A1C: Hgb A1c MFr Bld: 5.7 % (ref 4.6–6.5)

## 2016-02-07 LAB — CBC
HEMATOCRIT: 40.8 % (ref 39.0–52.0)
HEMOGLOBIN: 13.6 g/dL (ref 13.0–17.0)
MCHC: 33.3 g/dL (ref 30.0–36.0)
MCV: 87.4 fl (ref 78.0–100.0)
PLATELETS: 351 10*3/uL (ref 150.0–400.0)
RBC: 4.67 Mil/uL (ref 4.22–5.81)
RDW: 14.8 % (ref 11.5–15.5)
WBC: 8.7 10*3/uL (ref 4.0–10.5)

## 2016-02-07 MED ORDER — ALPRAZOLAM 1 MG PO TABS: 1.0000 mg | ORAL_TABLET | Freq: Three times a day (TID) | ORAL | Status: DC

## 2016-02-07 MED ORDER — HYDROCHLOROTHIAZIDE 25 MG PO TABS: 25.0000 mg | ORAL_TABLET | Freq: Every day | ORAL | Status: DC

## 2016-02-07 NOTE — Patient Instructions (Addendum)
Nice to meet you. Please keep your follow-up with your cardiologist later this week. We will increase your hydrochlorothiazide to 25 mg to treat your blood pressure. I refilled your Xanax. We will check lab work today. We will obtain an x-ray of your neck to evaluate for cause of your pain. If you develop chest pain, shortness of breath, sweatiness, headache, vision changes, numbness, weakness, irregular heartbeat, passing out, or any new or changing symptoms please seek medical attention.

## 2016-02-07 NOTE — Progress Notes (Signed)
Patient ID: Jason Romero, male   DOB: 1970-11-23, 45 y.o.   MRN: [id number]  Jason Rumps, MD Phone: [phone]  Jason Romero is a 45 y.o. male who presents today for new patient visit.  Chest pain: Patient notes intermittent left-sided chest discomfort since last week. Last episode was last night. Accompanied by shortness of breath and sweatiness. Lasts briefly and resolved on its own. He was hospitalized in [facility] for this issue and underwent stress testing and troponin monitoring. Troponins were negative. Stress test was negative. He notes the discomfort starts out in his left shoulder and arm and goes to his left chest. Notes his left shoulder and arm hurt all the time. The chest pain is intermittent. Has history of left elbow surgery. Has chronic left arm pain for many years. He additionally notes he gets a burning sensation in the back of his neck with this. He also notes intermittent palpitations when walking and working that he describes as heart racing. He has no chest pain, shortness of breath, or palpitations at this time. He does have left arm pain. He was also evaluated in the emergency room last week after presenting to clinic with similar symptoms to the above. He had negative troponin. His EKG at that time was reassuring. He had a CT angiogram of his chest that revealed a normal aorta. No evidence of PE. He had a negative d-dimer as well. He has no ch negative d-dimer as well. est pain, shortness of breath, sweatiness, or palpitations at this time.  Headaches: Patient notes history of headaches occurring once a month lasting for several days in the bitemporal region. Notes he gets an aura with seeing spots with this. Does occasionally have blurred vision and sees spots during the headaches. Notes headaches have been going on since 2013. He notes no numbness or weakness with these.  Patient notes a syncopal event 1 year ago. Has had these recurrently over the years.  He notes he gets hot and then feels as though he is going to pass out and has passed out previously. No chest pain or shortness of breath or palpitations preceding this.  Patient additionally notes excessive thirst and frequent urination.  Patient additionally notes anxiety that is well-controlled with Xanax. He denies depression. Denies SI and HI.  Active Ambulatory Problems    Diagnosis Date Noted  . Chest pain 02/12/2016  . Essential hypertension 02/12/2016  . Musculoskeletal pain 02/12/2016  . Anxiety 02/12/2016  . Excessive thirst 02/12/2016  . Migraines 02/12/2016  . Syncope 02/12/2016   Resolved Ambulatory Problems    Diagnosis Date Noted  . No Resolved Ambulatory Problems   Past Medical History  Diagnosis Date  . Hypertension   . COPD (chronic obstructive pulmonary disease) ([place])   . MI (myocardial infarction) ([place])   . A-fib ([place])   . Chronic headaches   . GERD (gastroesophageal reflux disease)   . History of blood transfusion   . Seizures ([place])     Family History  Problem Relation Age of Onset  . Alcoholism    . Arthritis    . Breast cancer    . Heart disease Mother     Social History   Social History  . Marital Status: Married    Spouse Name: N/A  . Number of Children: N/A  . Years of Education: N/A   Occupational History  . Not on file.   Social History Main Topics  . Smoking status: Current Every Day Smoker --  1.00 packs/day  . Smokeless tobacco: Not on file  . Alcohol Use: 0.0 oz/week    0 Standard drinks or equivalent per week  . Drug Use: No  . Sexual Activity: Not on file   Other Topics Concern  . Not on file   Social History Narrative    ROS   General:  Negative for nexplained weight loss, fever Skin: Negative for new or changing mole, sore that won't heal HEENT: Positive for trouble seeing, ringing in ears, mouth sores, negative for trouble hearing, hoarseness, change in voice, dysphagia. CV:  Positive for chest pain, dyspnea,  palpitations, negative for edema Resp: Positive for dyspnea, Negative for cough, hemoptysis GI: Positive for nausea, negative for vomiting, diarrhea, constipation, abdominal pain, melena, hematochezia. GU: Positive for frequent urination, Negative for dysuria, incontinence, urinary hesitance, hematuria, vaginal or penile discharge, polyuria, sexual difficulty, lumps in testicle or breasts MSK: Positive for muscle cramps or aches, joint pain or swelling Neuro: Positive for headaches, weakness, numbness, dizziness, passing out/fainting Psych: Negative for depression, anxiety, positive for stress and memory problems  Objective  Physical Exam Filed Vitals:   02/07/16 0830  BP: 158/102  Pulse: 64  Temp: 97.8 F (36.6 C)    BP Readings from Last 3 Encounters:  02/07/16 158/102  02/02/16 123/84  11/09/15 160/82    Physical Exam  Constitutional: He is well-developed, well-nourished, and in no distress.  HENT:  Head: Normocephalic and atraumatic.  Right Ear: External ear normal.  Left Ear: External ear normal.  Mouth/Throat: Oropharynx is clear and moist.  Eyes: Conjunctivae are normal. Pupils are equal, round, and reactive to light.  Neck: Neck supple.  Cardiovascular: Normal rate, regular rhythm and normal heart sounds.  Exam reveals no gallop and no friction rub.   No murmur heard. Pulmonary/Chest: Effort normal and breath sounds normal. No respiratory distress. He has no wheezes. He has no rales.  Abdominal: Soft. Bowel sounds are normal. He exhibits no distension. There is no tenderness. There is no rebound and no guarding.  Musculoskeletal:  Left shoulder with anterior tenderness to palpation, discomfort on internal and external rotation, there is probable bony deformity noted in the left elbow, the left elbow is nontender, left elbow with full range of motion, left chest is nontender Right shoulder with no tenderness, discomfort on range of motion, or bony deformity, no right  elbow normal in appearance and nontender  Lymphadenopathy:    He has no cervical adenopathy.  Neurological: He is alert.  CN 2-12 intact, 5/5 strength in bilateral biceps, triceps, grip, quads, hamstrings, plantar and dorsiflexion, sensation to light touch intact in bilateral UE and LE, normal gait, 2+ patellar reflexes  Skin: Skin is warm and dry. He is not diaphoretic.  Psychiatric: Mood and affect normal.   EKG: Normal sinus rhythm, rate 62, T-wave inverted in lead 3 previously seen, T-wave upright in V1 which is changed from previously.  Assessment/Plan:   Chest pain Patient's chest pain is somewhat typical and atypical. He recently underwent stress testing that was negative at outside hospital. He has undergone workup for PE and aortic dissection that has been negative. Unlikely related to pulmonary process given negative CTA. Unlikely GI related given symptomatology. Suspect MSK cause though with the typical features we will refer back to his cardiologist for follow-up. Patient will continue to monitor. Given return precautions.  Essential hypertension Not at goal. We will increase his HCTZ. He will monitor this at home. This will be rechecked at his cardiology office visit  later this week.  Musculoskeletal pain Patient with a variety of areas of musculoskeletal pain. Notes pain in his left arm specifically the elbow and shoulder and in his neck. He is neurologically intact. We will obtain cervical spine x-ray to evaluate his neck. We will refer to orthopedics for his left elbow. He will continue to monitor. He is given return precautions.  Anxiety Xanax refilled for short course and patient will return to discuss this issue further at future office visit.  Excessive thirst We'll check an A1c and lab work as outlined below.  Migraines Headaches are most consistent with migraines. He is neurologically intact at this time. Discussed being evaluated by ophthalmology for his vision. His  vision is normal today. Given relative infrequency he will continue to monitor. Given return precautions.  Syncope Suspect vasovagal syncope as he feels warm prior to passing out. No other symptomatology prior to passing out. He will follow-up with his cardiologist for further workup and management. Given return precautions..    Orders Placed This Encounter  Procedures  . DG Cervical Spine Complete    Standing Status: Future     Number of Occurrences: 1     Standing Expiration Date: 04/08/2017    Order Specific Question:  Reason for Exam (SYMPTOM  OR DIAGNOSIS REQUIRED)    Answer:  radicular pain in left arm    Order Specific Question:  Preferred imaging location?    Answer:  [place] ([facility])  . CBC  . HgB A1c  . Ambulatory referral to Cardiology    Referral Priority:  Routine    Referral Type:  Consultation    Referral Reason:  Specialty Services Required    Requested Specialty:  Cardiology    Number of Visits Requested:  1  . Ambulatory referral to Orthopedic Surgery    Referral Priority:  Routine    Referral Type:  Surgical    Referral Reason:  Specialty Services Required    Requested Specialty:  Orthopedic Surgery    Number of Visits Requested:  1  . EKG 12-Lead    Meds ordered this encounter  Medications  . ALPRAZolam (XANAX) 1 MG tablet    Sig: Take 1 tablet (1 mg total) by mouth 3 (three) times daily.    Dispense:  30 tablet    Refill:  0  . hydrochlorothiazide (HYDRODIURIL) 25 MG tablet    Sig: Take 1 tablet (25 mg total) by mouth daily.    Dispense:  90 tablet    Refill:  [place], MD [place]

## 2016-02-07 NOTE — Progress Notes (Signed)
Pre visit review using our clinic review tool, if applicable. No additional management support is needed unless otherwise documented below in the visit note. 

## 2016-02-09 ENCOUNTER — Other Ambulatory Visit: Payer: Self-pay | Admitting: Family Medicine

## 2016-02-09 DIAGNOSIS — I6529 Occlusion and stenosis of unspecified carotid artery: Secondary | ICD-10-CM

## 2016-02-12 ENCOUNTER — Encounter: Payer: Self-pay | Admitting: Family Medicine

## 2016-02-12 DIAGNOSIS — R631 Polydipsia: Secondary | ICD-10-CM | POA: Insufficient documentation

## 2016-02-12 DIAGNOSIS — G43909 Migraine, unspecified, not intractable, without status migrainosus: Secondary | ICD-10-CM | POA: Insufficient documentation

## 2016-02-12 DIAGNOSIS — F419 Anxiety disorder, unspecified: Secondary | ICD-10-CM | POA: Insufficient documentation

## 2016-02-12 DIAGNOSIS — R55 Syncope and collapse: Secondary | ICD-10-CM | POA: Insufficient documentation

## 2016-02-12 DIAGNOSIS — R079 Chest pain, unspecified: Secondary | ICD-10-CM | POA: Insufficient documentation

## 2016-02-12 DIAGNOSIS — I10 Essential (primary) hypertension: Secondary | ICD-10-CM | POA: Insufficient documentation

## 2016-02-12 DIAGNOSIS — M7918 Myalgia, other site: Secondary | ICD-10-CM | POA: Insufficient documentation

## 2016-02-12 NOTE — Assessment & Plan Note (Signed)
Patient with a variety of areas of musculoskeletal pain. Notes pain in his left arm specifically the elbow and shoulder and in his neck. He is neurologically intact. We will obtain cervical spine x-ray to evaluate his neck. We will refer to orthopedics for his left elbow. He will continue to monitor. He is given return precautions.

## 2016-02-12 NOTE — Assessment & Plan Note (Addendum)
Not at goal. We will increase his HCTZ. He will monitor this at home. This will be rechecked at his cardiology office visit later this week.

## 2016-02-12 NOTE — Assessment & Plan Note (Signed)
Patient's chest pain is somewhat typical and atypical. He recently underwent stress testing that was negative at outside hospital. He has undergone workup for PE and aortic dissection that has been negative. Unlikely related to pulmonary process given negative CTA. Unlikely GI related given symptomatology. Suspect MSK cause though with the typical features we will refer back to his cardiologist for follow-up. Patient will continue to monitor. Given return precautions.

## 2016-02-12 NOTE — Assessment & Plan Note (Signed)
We'll check an A1c and lab work as outlined below.

## 2016-02-12 NOTE — Assessment & Plan Note (Signed)
Suspect vasovagal syncope as he feels warm prior to passing out. No other symptomatology prior to passing out. He will follow-up with his cardiologist for further workup and management. Given return precautions..[place] Kitchen

## 2016-02-12 NOTE — Assessment & Plan Note (Signed)
Headaches are most consistent with migraines. He is neurologically intact at this time. Discussed being evaluated by ophthalmology for his vision. His vision is normal today. Given relative infrequency he will continue to monitor. Given return precautions.

## 2016-02-12 NOTE — Assessment & Plan Note (Addendum)
Xanax refilled for short course and patient will return to discuss this issue further at future office visit.

## 2016-02-17 ENCOUNTER — Other Ambulatory Visit: Payer: Self-pay | Admitting: Family Medicine

## 2016-02-17 NOTE — Telephone Encounter (Signed)
We will refill. Please ensure patient has follow-up scheduled in the next month. Thanks.

## 2016-02-17 NOTE — Telephone Encounter (Signed)
Patient was seen on 3/28 and you prescribed this med, #30 but can take up to three times a day.  Please advise refill, thanks

## 2016-03-09 ENCOUNTER — Ambulatory Visit: Payer: BLUE CROSS/BLUE SHIELD | Admitting: Family Medicine

## 2016-03-09 DIAGNOSIS — Z0289 Encounter for other administrative examinations: Secondary | ICD-10-CM

## 2016-03-21 ENCOUNTER — Other Ambulatory Visit: Payer: Self-pay | Admitting: Family Medicine

## 2016-03-21 NOTE — Telephone Encounter (Signed)
Will plan to refill tomorrow. Patient missed his last appointment, so will wait until office visit to refill.

## 2016-03-21 NOTE — Telephone Encounter (Signed)
Patient has appointment with you 03/22/16.

## 2016-03-22 ENCOUNTER — Telehealth: Payer: Self-pay | Admitting: Family Medicine

## 2016-03-22 ENCOUNTER — Encounter: Payer: Self-pay | Admitting: Family Medicine

## 2016-03-22 ENCOUNTER — Ambulatory Visit (INDEPENDENT_AMBULATORY_CARE_PROVIDER_SITE_OTHER): Payer: BLUE CROSS/BLUE SHIELD | Admitting: Family Medicine

## 2016-03-22 VITALS — BP 112/74 | HR 60 | Temp 98.2°F | Ht 73.5 in | Wt 220.8 lb

## 2016-03-22 DIAGNOSIS — R0602 Shortness of breath: Secondary | ICD-10-CM

## 2016-03-22 DIAGNOSIS — R42 Dizziness and giddiness: Secondary | ICD-10-CM

## 2016-03-22 DIAGNOSIS — R634 Abnormal weight loss: Secondary | ICD-10-CM | POA: Diagnosis not present

## 2016-03-22 DIAGNOSIS — F419 Anxiety disorder, unspecified: Secondary | ICD-10-CM | POA: Diagnosis not present

## 2016-03-22 DIAGNOSIS — K0889 Other specified disorders of teeth and supporting structures: Secondary | ICD-10-CM

## 2016-03-22 LAB — COMPREHENSIVE METABOLIC PANEL
ALT: 27 U/L (ref 0–53)
AST: 20 U/L (ref 0–37)
Albumin: 4.5 g/dL (ref 3.5–5.2)
Alkaline Phosphatase: 55 U/L (ref 39–117)
BILIRUBIN TOTAL: 0.5 mg/dL (ref 0.2–1.2)
BUN: 16 mg/dL (ref 6–23)
CO2: 24 meq/L (ref 19–32)
CREATININE: 1.11 mg/dL (ref 0.40–1.50)
Calcium: 10.4 mg/dL (ref 8.4–10.5)
Chloride: 103 mEq/L (ref 96–112)
GFR: 75.66 mL/min (ref >60.00)
GLUCOSE: 98 mg/dL (ref 70–99)
Potassium: 3.9 mEq/L (ref 3.5–5.1)
Sodium: 141 mEq/L (ref 135–145)
Total Protein: 7.7 g/dL (ref 6.0–8.3)

## 2016-03-22 LAB — SEDIMENTATION RATE: Sed Rate: 20 mm/hr (ref 0–22)

## 2016-03-22 LAB — CBC WITH DIFFERENTIAL/PLATELET
BASOS ABS: 0.1 10*3/uL (ref 0.0–0.1)
Basophils Relative: 1.1 % (ref 0.0–3.0)
Eosinophils Absolute: 0.7 10*3/uL (ref 0.0–0.7)
Eosinophils Relative: 6.7 % — ABNORMAL HIGH (ref 0.0–5.0)
HCT: 45.4 % (ref 39.0–52.0)
Hemoglobin: 15.4 g/dL (ref 13.0–17.0)
LYMPHS ABS: 1.5 10*3/uL (ref 0.7–4.0)
Lymphocytes Relative: 15.3 % (ref 12.0–46.0)
MCHC: 33.8 g/dL (ref 30.0–36.0)
MCV: 88.1 fl (ref 78.0–100.0)
MONOS PCT: 7.2 % (ref 3.0–12.0)
Monocytes Absolute: 0.7 10*3/uL (ref 0.1–1.0)
NEUTROS ABS: 7 10*3/uL (ref 1.4–7.7)
NEUTROS PCT: 69.7 % (ref 43.0–77.0)
PLATELETS: 342 10*3/uL (ref 150.0–400.0)
RBC: 5.16 Mil/uL (ref 4.22–5.81)
RDW: 14.3 % (ref 11.5–15.5)
WBC: 10 10*3/uL (ref 4.0–10.5)

## 2016-03-22 LAB — POCT URINALYSIS DIPSTICK
GLUCOSE UA: NEGATIVE
Ketones, UA: 15
Leukocytes, UA: NEGATIVE
NITRITE UA: NEGATIVE
Protein, UA: NEGATIVE
Spec Grav, UA: 1.01
UROBILINOGEN UA: 0.2
pH, UA: 6

## 2016-03-22 LAB — C-REACTIVE PROTEIN: CRP: 0.4 mg/dL — ABNORMAL LOW (ref 0.5–20.0)

## 2016-03-22 LAB — TSH: TSH: 0.64 u[IU]/mL (ref 0.35–4.50)

## 2016-03-22 MED ORDER — HYDROCHLOROTHIAZIDE 25 MG PO TABS: 12.5000 mg | ORAL_TABLET | Freq: Every day | ORAL | Status: DC

## 2016-03-22 MED ORDER — ALPRAZOLAM 1 MG PO TABS: 1.0000 mg | ORAL_TABLET | Freq: Three times a day (TID) | ORAL | Status: DC

## 2016-03-22 MED ORDER — AMOXICILLIN-POT CLAVULANATE 875-125 MG PO TABS: 1.0000 | ORAL_TABLET | Freq: Two times a day (BID) | ORAL | Status: DC

## 2016-03-22 NOTE — Progress Notes (Signed)
Pre visit review using our clinic review tool, if applicable. No additional management support is needed unless otherwise documented below in the visit note. 

## 2016-03-22 NOTE — Assessment & Plan Note (Signed)
Patient with poor dentition. Broken posterior lower left molar. Mild jaw swelling with this. We'll place on Augmentin. He will follow-up with his dentist as planned for later today.

## 2016-03-22 NOTE — Assessment & Plan Note (Signed)
Well-controlled with Xanax 3 times a day. Refill was given.

## 2016-03-22 NOTE — Telephone Encounter (Signed)
Please advise and I will write up, thanks

## 2016-03-22 NOTE — Addendum Note (Signed)
Addended by: Montine CircleMALDONADO, Jacquelene Kopecky D on: 03/22/2016 01:57 PM   Modules accepted: Orders

## 2016-03-22 NOTE — Assessment & Plan Note (Addendum)
Suspect related to orthostatic hypotension given history. EKG is reassuring. Blood pressure is quite a bit lower today than has been in the past. We'll back off on his HCTZ to 12.5 mg daily. He'll continue to monitor. Given return precautions.

## 2016-03-22 NOTE — Telephone Encounter (Signed)
Pt saw Dr. Birdie SonsSonnenberg today for low BP. Pt is requesting a work excuse for this entire week.

## 2016-03-22 NOTE — Patient Instructions (Signed)
Nice to see you. We are going to start you on Augmentin for your dental issue. You should follow up with your dentist as scheduled. We're going to decrease the dose of your hydrochlorothiazide back to 12.5 to see if this will help with your lightheadedness. I refilled your Xanax. We are going to obtain some lab work may be due to stool cards to evaluate for your weight loss. If you develop chest pain, shortness of breath, worsening anxiety, fevers, depression, thoughts of harming yourself or others, or any new or changing symptoms please seek medical attention.

## 2016-03-22 NOTE — Assessment & Plan Note (Addendum)
Patient with 20+ pound unintentional weight loss over the last month and a half. Prior CT scan with no abnormalities. He does smoke. We will start workup with lab work as outlined below. He will complete stool cards as well. No obvious cause of this workup would consider GI referral for colonoscopy. Would also consider CT scan and pelvis.

## 2016-03-22 NOTE — Progress Notes (Signed)
Patient ID: Jason Romero, male   DOB: [birth year], 45 y.o.   MRN: [id number]  Jason Rumps, MD Phone: [phone]  Jason Romero is a 45 y.o. male who presents today for follow-up.  Dental pain: Patient notes 1 day last week he had the bottom left posterior molar breakoff. Notes no drainage. Notes it is irritating his tongue. Notes some swelling in his left jaw with this though no discomfort. No fevers. No drainage. He has a dentist appointment later today.  Lightheadedness: Patient notes over the last week he gets lightheaded when he stands up. He notes this gets better when he sits. No chest pain or palpitations with this. Does note some mild shortness of breath when he gets lightheaded though no exertional shortness of breath. He has had stress testing recently that was normal. We did recently increase his HCTZ to try to get his blood pressure at goal and currently his blood pressure is very well controlled. He has not had any syncopal events.  Anxiety: Patient notes this is okay. It is been slightly worse this week as he has not been able to go to work. Takes Xanax 3 times a day. Notes this helps him. Does not make him drowsy. No SI. No depression.  Unintentional weight loss: Patient notes since having his hydrochlorothiazide increased he has had 20+ pounds of weight loss. He notes no blood in his stool. He does smoke though did have a CT scan of his chest that had no noted abnormalities. He is not depressed. He notes no family history of colon cancer. He notes his appetite has been good and he has been eating well.  PMH: Smoker   ROS see history of present illness  Objective  Physical Exam Filed Vitals:   03/22/16 1024  BP: 112/74  Pulse: 60  Temp: 98.2 F (36.8 C)    BP Readings from Last 3 Encounters:  03/22/16 112/74  02/07/16 158/102  02/02/16 123/84   Wt Readings from Last 3 Encounters:  03/22/16 220 lb 12.8 oz (100.154 kg)  02/07/16 243 lb (110.224 kg)  02/02/16  270 lb (122.471 kg)   Laying blood pressure 106/70 pulse 56 Sitting blood pressure 104/72 pulse 58 Standing blood pressure 98/70 pulse 67  Physical Exam  Constitutional: He is well-developed, well-nourished, and in no distress.  HENT:  Head: Normocephalic and atraumatic.  Right Ear: External ear normal.  Left Ear: External ear normal.  Mouth/Throat: Oropharynx is clear and moist. No oropharyngeal exudate.  Poor dentition, left posterior lower molar with a small stub of tooth left, nontender, no gingival swelling, left lower jaw does appear to be slightly swollen compared to the right though is nontender.  Eyes: Conjunctivae are normal. Pupils are equal, round, and reactive to light.  Cardiovascular: Normal rate, regular rhythm and normal heart sounds.   Pulmonary/Chest: Effort normal and breath sounds normal.  Neurological: He is alert. Gait normal.  Skin: Skin is warm and dry. He is not diaphoretic.   EKG: Sinus bradycardia rate 59, no ST or T-wave changes  Assessment/Plan: Please see individual problem list.  Pain, dental Patient with poor dentition. Broken posterior lower left molar. Mild jaw swelling with this. We'll place on Augmentin. He will follow-up with his dentist as planned for later today.  Anxiety Well-controlled with Xanax 3 times a day. Refill was given.  Lightheadedness Suspect related to orthostatic hypotension given history. EKG is reassuring. Blood pressure is quite a bit lower today than has been in the past. We'll  back off on his HCTZ to 12.5 mg daily. He'll continue to monitor. Given return precautions.  Unintentional weight loss Patient with 20+ pound unintentional weight loss over the last month and a half. Prior CT scan with no abnormalities. He does smoke. We will start workup with lab work as outlined below. He will complete stool cards as well. No obvious cause of this workup would consider GI referral for colonoscopy. Would also consider CT scan and  pelvis.    Orders Placed This Encounter  Procedures  . Fecal occult blood, imunochemical    Standing Status: Future     Number of Occurrences:      Standing Expiration Date: 03/22/2017  . TSH  . Comp Met (CMET)  . CBC w/Diff  . Sed Rate (ESR)  . C-reactive protein  . HIV antibody (with reflex)  . POCT Urinalysis Dipstick  . EKG 12-Lead    Meds ordered this encounter  Medications  . amoxicillin-clavulanate (AUGMENTIN) 875-125 MG tablet    Sig: Take 1 tablet by mouth 2 (two) times daily.    Dispense:  14 tablet    Refill:  0  . ALPRAZolam (XANAX) 1 MG tablet    Sig: Take 1 tablet (1 mg total) by mouth 3 (three) times daily.    Dispense:  90 tablet    Refill:  0  . hydrochlorothiazide (HYDRODIURIL) 25 MG tablet    Sig: Take 0.5 tablets (12.5 mg total) by mouth daily.    Dispense:  90 tablet    Refill:  [place], MD [place]

## 2016-03-23 LAB — HIV ANTIBODY (ROUTINE TESTING W REFLEX): HIV 1&2 Ab, 4th Generation: NONREACTIVE

## 2016-03-23 LAB — URINALYSIS, MICROSCOPIC ONLY
Bacteria, UA: NONE SEEN [HPF]
Casts: NONE SEEN [LPF]
Crystals: NONE SEEN [HPF]
RBC / HPF: NONE SEEN RBC/HPF (ref <=2)
Squamous Epithelial / LPF: NONE SEEN [HPF] (ref <=5)
WBC UA: NONE SEEN WBC/HPF (ref <=5)
Yeast: NONE SEEN [HPF]

## 2016-03-23 NOTE — Telephone Encounter (Signed)
Note completed and placed up front for pickup, called patient and left message that is was ready.

## 2016-03-24 LAB — URINE CULTURE
COLONY COUNT: NO GROWTH
Organism ID, Bacteria: NO GROWTH

## 2016-03-26 ENCOUNTER — Telehealth: Payer: Self-pay

## 2016-03-26 NOTE — Telephone Encounter (Signed)
Patient should be evaluated given blood pressures and symptoms. I would suggest walkin clinic or urgent care.

## 2016-03-26 NOTE — Telephone Encounter (Signed)
Patient called the office, his Blood pressure has been running low since Friday.  When he was seen it was in the 110's over 70's with a heart rate in the 60's.  Currently it is 94/62 with pulse in the 70's.  Patient feels very sluggish and feels like he can't function.  He wants to know if he should go to to the ED.  It has been this low all weekend with taking his meds. Please advise?

## 2016-03-26 NOTE — Telephone Encounter (Signed)
He says he doesn't feel comfortable driving didn't even go to work he will call ems. thanks

## 2016-04-05 ENCOUNTER — Ambulatory Visit: Payer: BLUE CROSS/BLUE SHIELD | Admitting: Family Medicine

## 2016-04-23 ENCOUNTER — Other Ambulatory Visit: Payer: Self-pay | Admitting: Family Medicine

## 2016-04-23 NOTE — Telephone Encounter (Signed)
Please advise on refill of Xanax.  

## 2016-04-23 NOTE — Telephone Encounter (Signed)
Refill given. Patient needs an office visit for future refills.

## 2016-04-24 ENCOUNTER — Other Ambulatory Visit: Payer: Self-pay | Admitting: Family Medicine

## 2016-05-07 ENCOUNTER — Ambulatory Visit (INDEPENDENT_AMBULATORY_CARE_PROVIDER_SITE_OTHER): Payer: Self-pay | Admitting: Family Medicine

## 2016-05-07 ENCOUNTER — Encounter: Payer: Self-pay | Admitting: Family Medicine

## 2016-05-07 VITALS — BP 120/72 | HR 61 | Temp 98.6°F | Ht 73.5 in | Wt 229.8 lb

## 2016-05-07 DIAGNOSIS — Z9189 Other specified personal risk factors, not elsewhere classified: Secondary | ICD-10-CM

## 2016-05-07 DIAGNOSIS — Z87898 Personal history of other specified conditions: Secondary | ICD-10-CM

## 2016-05-07 DIAGNOSIS — R634 Abnormal weight loss: Secondary | ICD-10-CM

## 2016-05-07 DIAGNOSIS — Z87448 Personal history of other diseases of urinary system: Secondary | ICD-10-CM

## 2016-05-07 DIAGNOSIS — I10 Essential (primary) hypertension: Secondary | ICD-10-CM

## 2016-05-07 DIAGNOSIS — Q649 Congenital malformation of urinary system, unspecified: Secondary | ICD-10-CM

## 2016-05-07 NOTE — Progress Notes (Signed)
Pre visit review using our clinic review tool, if applicable. No additional management support is needed unless otherwise documented below in the visit note. 

## 2016-05-07 NOTE — Patient Instructions (Signed)
Nice to see you. We are going to refer you to a new cardiologist. We will refer you to a urologist as well. Please complete stool cards for your weight loss. Please consider whether or not you want to obtain a CT scan of your abdomen and pelvis as well. If you develop chest pain, shortness of breath, swelling, vision changes, or any new or changing symptoms please seek medical attention.

## 2016-05-08 DIAGNOSIS — N390 Urinary tract infection, site not specified: Secondary | ICD-10-CM | POA: Insufficient documentation

## 2016-05-08 DIAGNOSIS — Q649 Congenital malformation of urinary system, unspecified: Secondary | ICD-10-CM | POA: Insufficient documentation

## 2016-05-08 NOTE — Assessment & Plan Note (Addendum)
Patient is up 9 pounds since our last visit. Workup was unremarkable. Prior CT scan chest unremarkable for cause. Discussed given that he is also much weight over the last several months considering a CT abdomen and pelvis would be reasonable. Patient wants to hold off on this at this time and monitor his weight. We will see him back in a month for weight check. If he decides to do CT scan he will let us know and we can order this. He will complete stool cards.

## 2016-05-08 NOTE — Assessment & Plan Note (Signed)
At goal today. Had had some lightheadedness previously though this has not recurred in the past month with the decrease in hydrochlorothiazide. He is going back to work tomorrow and reports he has had issues with work in the past with his lightheadedness. I advised that if he develops any symptoms he should stop the activity and let us know that this has occurred. Given his widely variable blood pressures and recurrent lightheadedness in the past we will refer to cardiology for further evaluation. Given return precautions.

## 2016-05-08 NOTE — Progress Notes (Signed)
Patient ID: Jason Romero, male   DOB: 1971-10-04, 45 y.o.   MRN: [id number]  Jason AlarEric Sonnenberg, MD Phone: [phone]  Jason Romero is a 45 y.o. male who presents today for f/u.  HYPERTENSION Disease Monitoring Home BP Monitoring not checking consistently, though reports widely variable blood pressures with blood pressures as low as 80s systolic up to 190s systolic Chest pain- no    Dyspnea- no Medications Compliance-  Taking clonidine, hydralazine, HCTZ, lisinopril, and metoprolol. Lightheadedness-  none in the last month, does note prior to that he had been getting lightheaded on standing. He did feel as though he was going to pass out at one point though did not. Edema- no  Weight loss: Patient is up 9 pounds. He did not complete the stool cards. Prior workup was unremarkable for cause. He does not want to proceed with CT scan abdomen and pelvis at this time due to cost concerns. He wants to continue to monitor this.  Patient additionally notes occasionally feels as though he needs to be though sometimes he can't when he gets to the bathroom. Nocturia times tonight. Normal urinary stream. No dysuria. No abdominal pain.   PMH: Smoker   ROS see history of present illness  Objective  Physical Exam Filed Vitals:   05/07/16 1353  BP: 120/72  Pulse: 61  Temp: 98.6 F (37 C)    BP Readings from Last 3 Encounters:  05/07/16 120/72  03/22/16 112/74  02/07/16 158/102   Wt Readings from Last 3 Encounters:  05/07/16 229 lb 12.8 oz (104.237 kg)  03/22/16 220 lb 12.8 oz (100.154 kg)  02/07/16 243 lb (110.224 kg)    Physical Exam  Constitutional: He is well-developed, well-nourished, and in no distress.  HENT:  Head: Normocephalic and atraumatic.  Right Ear: External ear normal.  Left Ear: External ear normal.  Cardiovascular: Normal rate, regular rhythm and normal heart sounds.   Pulmonary/Chest: Effort normal and breath sounds normal.  Abdominal: Soft. Bowel sounds are  normal. He exhibits no distension. There is no tenderness. There is no rebound and no guarding.  Musculoskeletal: He exhibits no edema.  Neurological: He is alert. Gait normal.  Skin: Skin is warm and dry. He is not diaphoretic.     Assessment/Plan: Please see individual problem list.  Essential hypertension At goal today. Had had some lightheadedness previously though this has not recurred in the past month with the decrease in hydrochlorothiazide. He is going back to work tomorrow and reports he has had issues with work in the past with his lightheadedness. I advised that if he develops any symptoms he should stop the activity and let us know that this has occurred. Given his widely variable blood pressures and recurrent lightheadedness in the past we will refer to cardiology for further evaluation. Given return precautions.  Unintentional weight loss Patient is up 9 pounds since our last visit. Workup was unremarkable. Prior CT scan chest unremarkable for cause. Discussed given that he is also much weight over the last several months considering a CT abdomen and pelvis would be reasonable. Patient wants to hold off on this at this time and monitor his weight. We will see him back in a month for weight check. If he decides to do CT scan he will let us know and we can order this. He will complete stool cards.  Urinary anomaly Patient notes issues with trying to go to the bathroom occasionally. He deferred rectal exam. We will refer to urology.  Orders Placed This Encounter  Procedures  . Ambulatory referral to Cardiology    Referral Priority:  Routine    Referral Type:  Consultation    Referral Reason:  Specialty Services Required    Requested Specialty:  Cardiology    Number of Visits Requested:  1  . Ambulatory referral to Urology    Referral Priority:  Routine    Referral Type:  Consultation    Referral Reason:  Specialty Services Required    Requested Specialty:  Urology     Number of Visits Requested:  1   Jason AlarEric Sonnenberg, MD [facility] [place]

## 2016-05-08 NOTE — Assessment & Plan Note (Signed)
Patient notes issues with trying to go to the bathroom occasionally. He deferred rectal exam. We will refer to urology.

## 2016-05-23 ENCOUNTER — Other Ambulatory Visit: Payer: Self-pay | Admitting: Family Medicine

## 2016-05-23 NOTE — Telephone Encounter (Signed)
Can we fill this?

## 2016-05-24 NOTE — Telephone Encounter (Signed)
RX faxed

## 2016-05-24 NOTE — Telephone Encounter (Signed)
Refill given

## 2016-05-24 NOTE — Telephone Encounter (Signed)
Pt called to follow up on his Rx.   Call pt @ [phone]. Thank you!

## 2016-05-31 ENCOUNTER — Encounter: Payer: Self-pay | Admitting: Urology

## 2016-05-31 ENCOUNTER — Ambulatory Visit: Payer: Self-pay | Admitting: Urology

## 2016-06-07 ENCOUNTER — Ambulatory Visit: Payer: Self-pay | Admitting: Cardiovascular Disease

## 2016-06-07 ENCOUNTER — Ambulatory Visit: Payer: Self-pay | Admitting: Family Medicine

## 2016-06-07 ENCOUNTER — Encounter: Payer: Self-pay | Admitting: *Deleted

## 2016-06-07 DIAGNOSIS — Z0289 Encounter for other administrative examinations: Secondary | ICD-10-CM

## 2016-06-27 ENCOUNTER — Other Ambulatory Visit: Payer: Self-pay | Admitting: Family Medicine

## 2016-06-27 NOTE — Telephone Encounter (Signed)
Can we refill this? 

## 2016-06-27 NOTE — Telephone Encounter (Signed)
Refill can be sent to pharmacy.

## 2016-07-31 ENCOUNTER — Other Ambulatory Visit: Payer: Self-pay | Admitting: Family Medicine

## 2016-08-01 NOTE — Telephone Encounter (Signed)
Refill given. Patient has to be seen in the office for future refills.

## 2016-08-01 NOTE — Telephone Encounter (Signed)
Refilled 06/27/16. Pt last seen on 05/07/16. Please advise?

## 2016-08-02 ENCOUNTER — Other Ambulatory Visit: Payer: Self-pay | Admitting: Family Medicine

## 2016-08-02 NOTE — Telephone Encounter (Signed)
Pt called to follow up on the Rx. Thank you! °

## 2016-08-02 NOTE — Telephone Encounter (Signed)
Tried to call patient but no working numbers. We need to speak with patient before RX can be refilled per Dr. Birdie SonsSonnenberg. Patient was in jail last communication that we had. [place] [facility] sent for medical records on 07/02/16.

## 2016-08-02 NOTE — Telephone Encounter (Signed)
There is not a working number for this patient. See previous message stating that I need to speak with patient about RX

## 2016-08-03 ENCOUNTER — Telehealth: Payer: Self-pay | Admitting: *Deleted

## 2016-08-03 NOTE — Telephone Encounter (Signed)
Pt requested a medication refill for alprazolam  Pharmacy [facility] [address]

## 2016-08-03 NOTE — Telephone Encounter (Signed)
It has been refilled on [id number]. Please advise.

## 2016-08-06 MED ORDER — LISINOPRIL 40 MG PO TABS: 40.0000 mg | ORAL_TABLET | Freq: Every day | ORAL | 1 refills | Status: DC

## 2016-08-06 MED ORDER — ALPRAZOLAM 1 MG PO TABS: 1.0000 mg | ORAL_TABLET | Freq: Three times a day (TID) | ORAL | 0 refills | Status: DC

## 2016-08-06 NOTE — Telephone Encounter (Signed)
Spoke with patient and faxed RX. He is going to call back to scheduled appointment due to being out of town. Also refilled his lisinopril.

## 2016-08-06 NOTE — Telephone Encounter (Signed)
There are two prior messages for this refill. There is not a working number for this patient in the system and per Dr. Birdie SonsSonnenberg we need to speak with the patient before the RX can be sent in. PLEASE the next time the patient calls in get a working number. The RX was not sent in on 08/01/16 due to the last information we received patient was in jail. He needs to be scheduled for an appointment as well.

## 2016-09-04 ENCOUNTER — Telehealth: Payer: Self-pay | Admitting: Family Medicine

## 2016-09-04 ENCOUNTER — Other Ambulatory Visit: Payer: Self-pay | Admitting: Family Medicine

## 2016-09-04 NOTE — Telephone Encounter (Signed)
Pt called and requested a follow up appt for this week. Dr. Birdie SonsSonnenberg does not have anything available. Pt stated that he was told to just call and we would schedule him for an appt do to the fact that he works out of state. [place] will [place] leaving Monday for [place]. Please advise as to where to schedule. Thank you!  Call pt @ [phone]

## 2016-09-04 NOTE — Telephone Encounter (Signed)
Refilled last 08/06/16. Pt last seen 05/07/16. Pt called 09/04/16 about appt but works of town. Please advise?

## 2016-09-05 NOTE — Telephone Encounter (Signed)
Refill will be given. Patient needs an appointment scheduled for later this week. Please see other phone note.

## 2016-09-05 NOTE — Telephone Encounter (Signed)
Patient can be scheduled for a 30 minute visit at 4:15 on the 26th or he can be scheduled at 3:00 for a 30 minute visit on the 27th. In the future he needs to call further and advance to ensure that we have appointments available.

## 2016-09-05 NOTE — Telephone Encounter (Signed)
Scheduled patient to come in at 4:15 09/06/16

## 2016-09-06 ENCOUNTER — Ambulatory Visit (INDEPENDENT_AMBULATORY_CARE_PROVIDER_SITE_OTHER): Payer: BLUE CROSS/BLUE SHIELD | Admitting: Family Medicine

## 2016-09-06 ENCOUNTER — Encounter: Payer: Self-pay | Admitting: Family Medicine

## 2016-09-06 VITALS — BP 132/84 | HR 64 | Temp 97.7°F | Wt 233.0 lb

## 2016-09-06 DIAGNOSIS — G4730 Sleep apnea, unspecified: Secondary | ICD-10-CM | POA: Insufficient documentation

## 2016-09-06 DIAGNOSIS — Z23 Encounter for immunization: Secondary | ICD-10-CM

## 2016-09-06 DIAGNOSIS — I10 Essential (primary) hypertension: Secondary | ICD-10-CM

## 2016-09-06 DIAGNOSIS — K5900 Constipation, unspecified: Secondary | ICD-10-CM | POA: Insufficient documentation

## 2016-09-06 DIAGNOSIS — F32A Depression, unspecified: Secondary | ICD-10-CM

## 2016-09-06 DIAGNOSIS — F419 Anxiety disorder, unspecified: Secondary | ICD-10-CM

## 2016-09-06 DIAGNOSIS — F329 Major depressive disorder, single episode, unspecified: Secondary | ICD-10-CM

## 2016-09-06 DIAGNOSIS — Z720 Tobacco use: Secondary | ICD-10-CM | POA: Insufficient documentation

## 2016-09-06 MED ORDER — BUPROPION HCL ER (SR) 150 MG PO TB12: ORAL_TABLET | ORAL | 3 refills | Status: DC

## 2016-09-06 NOTE — Progress Notes (Signed)
Pre visit review using our clinic review tool, if applicable. No additional management support is needed unless otherwise documented below in the visit note. 

## 2016-09-06 NOTE — Assessment & Plan Note (Signed)
Weight continues to trend up. He'll continue to monitor. He will complete the stool cards.

## 2016-09-06 NOTE — Assessment & Plan Note (Signed)
At goal. Continue current medications. Check CMP. 

## 2016-09-06 NOTE — Assessment & Plan Note (Signed)
Episodes patient describes while sleeping are concerning for sleep apnea. We will obtain a sleep study.

## 2016-09-06 NOTE — Patient Instructions (Signed)
Nice to see you. Please continue your blood pressure medications. We are going to get a sleep study. We are going to refer you to a therapist.  We are going to start you on Wellbutrin to help with anxiety and depression and smoking cessation. Please complete the stool cards. Please start on MiraLAX for your constipation.

## 2016-09-06 NOTE — Assessment & Plan Note (Signed)
Patient is interested in quitting smoking. We will trial Wellbutrin for this.

## 2016-09-06 NOTE — Assessment & Plan Note (Signed)
Patient with continued anxiety and depression. He brings up increasing his Xanax dose. I advised that we would need to try a controller medication and a therapist prior to even considering changing his dosing. We will start on Wellbutrin to help with smoking cessation and anxiety and depression. We will refer to psychology.

## 2016-09-06 NOTE — Assessment & Plan Note (Signed)
One bowel movement a week. Benign abdominal exam. He'll get over-the-counter MiraLAX and try this.

## 2016-09-06 NOTE — Progress Notes (Signed)
[name], MD Phone: [phone]  Jason Romero is a 45 y.o. male who presents today for follow-up.  HYPERTENSION  Disease Monitoring  Home BP Monitoring 092 systolic, does report when he was in jail is in the 330Q systolic Chest pain- no    Dyspnea- no Medications  Compliance-  taking lisinopril, HCTZ, hydralazine, clonidine, metoprolol. Lightheadedness-  no  Edema- no  Anxiety: Patient notes this has somewhat worsened. Notes people can tell when he hasn't taken his Xanax. He does feel down somewhat. He has not ever been on other medication for anxiety. No SI. He is interested in seeing a therapist.  Weight loss has stabilized. He's been eating well. He has not completed the stool cards.  Patient and his significant other note that he gasps while sleeping at times. He notes he wakes up tired. Does not fall asleep easily during the day. Has been told in the past there was some concern for sleep apnea.  Constipation: Patient notes he has one dark brown bowel movement once a week. No blood in stool. No melena. No stomach pain with this. Given this he has been unable to complete the stool cards that were previously provided.  Tobacco abuse: Patient is ready to quit smoking. Notes he did not smoke for 15 days while he was in jail and he felt significantly better. He is interested in Wellbutrin after our discussion.  PMH: Smoker   ROS see history of present illness  Objective  Physical Exam Vitals:   09/06/16 1608  BP: 132/84  Pulse: 64  Temp: 97.7 F (36.5 C)    BP Readings from Last 3 Encounters:  09/06/16 132/84  05/07/16 120/72  03/22/16 112/74   Wt Readings from Last 3 Encounters:  09/06/16 233 lb (105.7 kg)  05/07/16 229 lb 12.8 oz (104.2 kg)  03/22/16 220 lb 12.8 oz (100.2 kg)    Physical Exam  Constitutional: No distress.  HENT:  Head: Normocephalic and atraumatic.  Cardiovascular: Normal rate, regular rhythm and normal heart sounds.     Pulmonary/Chest: Effort normal and breath sounds normal.  Abdominal: Soft. Bowel sounds are normal. He exhibits no distension. There is no tenderness. There is no rebound and no guarding.  Musculoskeletal: He exhibits no edema.  Neurological: He is alert. Gait normal.  Skin: Skin is warm and dry. He is not diaphoretic.  Psychiatric:  Mood anxious, affect normal     Assessment/Plan: Please see individual problem list.  Essential hypertension At goal. Continue current medications. Check CMP.  Unintentional weight loss Weight continues to trend up. He'll continue to monitor. He will complete the stool cards.  Sleep apnea Episodes patient describes while sleeping are concerning for sleep apnea. We will obtain a sleep study.  Anxiety Patient with continued anxiety and depression. He brings up increasing his Xanax dose. I advised that we would need to try a controller medication and a therapist prior to even considering changing his dosing. We will start on Wellbutrin to help with smoking cessation and anxiety and depression. We will refer to psychology.  Constipation One bowel movement a week. Benign abdominal exam. He'll get over-the-counter MiraLAX and try this.  Tobacco abuse Patient is interested in quitting smoking. We will trial Wellbutrin for this.   Orders Placed This Encounter  Procedures  . Flu Vaccine QUAD 36+ mos IM  . Comp Met (CMET)  . Ambulatory referral to Psychology    Referral Priority:   Routine    Referral Type:   Psychiatric  Referral Reason:   Specialty Services Required    Requested Specialty:   Psychology    Number of Visits Requested:   1  . Split night study    Standing Status:   Future    Standing Expiration Date:   09/06/2017    Order Specific Question:   Where should this test be performed:    Answer:   [facility]    Meds ordered this encounter  Medications  . buPROPion (WELLBUTRIN SR) 150 MG 12 hr tablet    Sig: Take 150 mg (one tablet) by  mouth daily for 3 days, then take 150 mg (1 tablet) by mouth twice daily    Dispense:  60 tablet    Refill:  3   [name], MD [place]

## 2016-09-07 ENCOUNTER — Encounter: Payer: Self-pay | Admitting: Family Medicine

## 2016-09-07 LAB — COMPREHENSIVE METABOLIC PANEL
ALK PHOS: 68 U/L (ref 39–117)
ALT: 26 U/L (ref 0–53)
AST: 20 U/L (ref 0–37)
Albumin: 4.4 g/dL (ref 3.5–5.2)
BUN: 14 mg/dL (ref 6–23)
CHLORIDE: 104 meq/L (ref 96–112)
CO2: 27 mEq/L (ref 19–32)
Calcium: 9.8 mg/dL (ref 8.4–10.5)
Creatinine, Ser: 1.25 mg/dL (ref 0.40–1.50)
GFR: 65.84 mL/min (ref >60.00)
GLUCOSE: 99 mg/dL (ref 70–99)
POTASSIUM: 3.8 meq/L (ref 3.5–5.1)
SODIUM: 140 meq/L (ref 135–145)
TOTAL PROTEIN: 7.6 g/dL (ref 6.0–8.3)
Total Bilirubin: 0.3 mg/dL (ref 0.2–1.2)

## 2016-10-03 ENCOUNTER — Telehealth: Payer: Self-pay | Admitting: Family Medicine

## 2016-10-03 NOTE — Telephone Encounter (Signed)
Is it ok to refill this?  

## 2016-10-03 NOTE — Telephone Encounter (Signed)
Send to pharmacy

## 2016-10-26 NOTE — Telephone Encounter (Signed)
Received fax back from [facility] regarding sleep study. His insurance did not approve the in lab study. Ok to send order HST?

## 2016-10-26 NOTE — Telephone Encounter (Signed)
Yes it is ok for home sleep study.

## 2016-10-31 ENCOUNTER — Other Ambulatory Visit: Payer: Self-pay | Admitting: Family Medicine

## 2016-10-31 NOTE — Telephone Encounter (Signed)
faxed

## 2016-10-31 NOTE — Telephone Encounter (Signed)
Last filled 10/03/16 90 0rf

## 2016-10-31 NOTE — Telephone Encounter (Signed)
Refill given. Please fax to pharmacy. 

## 2016-11-14 ENCOUNTER — Telehealth: Payer: Self-pay | Admitting: Family Medicine

## 2016-11-14 NOTE — Telephone Encounter (Signed)
Pt called and stated that he has been urinating blood for 3 days. He also stated that his heart rate has been flucating, it was 158 this morning and right now is running 70 and 80, feet are ice cold, and having back pain. Based on his symptoms sent call to [facility] Triage.

## 2016-11-14 NOTE — Telephone Encounter (Signed)
Called and talked with patient was advised he has had periodic episodes of blood in urine with mid right quadrant pain in back at base of rib.Patient notices the blood more in the morning, patient also complained of pulse of 100 to 158, advised patient he soul go to ER and to have someone drive him, or call EMS, due to HR of 158 on monitor patient stated he is on a cardiac monitor.Patient agreed to go to ER.

## 2016-11-14 NOTE — Telephone Encounter (Signed)
Noted and agree. 

## 2016-11-29 ENCOUNTER — Other Ambulatory Visit: Payer: Self-pay | Admitting: Family Medicine

## 2016-11-30 NOTE — Telephone Encounter (Signed)
faxed

## 2016-11-30 NOTE — Telephone Encounter (Signed)
Please fax to pharmacy

## 2016-11-30 NOTE — Telephone Encounter (Signed)
Last OV 09/06/16 last filled 10/31/16 90 0rf

## 2016-12-07 ENCOUNTER — Ambulatory Visit: Payer: BLUE CROSS/BLUE SHIELD | Admitting: Family Medicine

## 2017-01-02 ENCOUNTER — Other Ambulatory Visit: Payer: Self-pay | Admitting: Family Medicine

## 2017-01-02 NOTE — Telephone Encounter (Signed)
Last OV 09/06/16 last filled 11/30/16 90 0rf

## 2017-01-02 NOTE — Telephone Encounter (Signed)
Faxed, patient is scheduled for follow up 

## 2017-01-02 NOTE — Telephone Encounter (Signed)
Patient needs office visit scheduled for future refills. Please let him know he needs to follow-up at least every 3 months in the future to continue to receive this medication. Please get follow-up (30 minutes) set up in the next month. Thanks.

## 2017-01-25 ENCOUNTER — Ambulatory Visit: Payer: BLUE CROSS/BLUE SHIELD | Admitting: Family Medicine

## 2017-02-01 ENCOUNTER — Encounter: Payer: Self-pay | Admitting: Family Medicine

## 2017-02-01 ENCOUNTER — Ambulatory Visit (INDEPENDENT_AMBULATORY_CARE_PROVIDER_SITE_OTHER): Payer: BLUE CROSS/BLUE SHIELD | Admitting: Family Medicine

## 2017-02-01 ENCOUNTER — Other Ambulatory Visit: Payer: Self-pay | Admitting: Family Medicine

## 2017-02-01 DIAGNOSIS — J329 Chronic sinusitis, unspecified: Secondary | ICD-10-CM | POA: Insufficient documentation

## 2017-02-01 DIAGNOSIS — J019 Acute sinusitis, unspecified: Secondary | ICD-10-CM

## 2017-02-01 MED ORDER — DOXYCYCLINE HYCLATE 100 MG PO TABS: 100.0000 mg | ORAL_TABLET | Freq: Two times a day (BID) | ORAL | 0 refills | Status: DC

## 2017-02-01 MED ORDER — ALPRAZOLAM 1 MG PO TABS: 1.0000 mg | ORAL_TABLET | Freq: Three times a day (TID) | ORAL | 0 refills | Status: DC

## 2017-02-01 MED ORDER — LISINOPRIL 40 MG PO TABS: 40.0000 mg | ORAL_TABLET | Freq: Every day | ORAL | 0 refills | Status: DC

## 2017-02-01 NOTE — Assessment & Plan Note (Signed)
New acute problem. Treating with Doxy.

## 2017-02-01 NOTE — Progress Notes (Signed)
   Subjective:  Patient ID: Jason Romero, male    DOB: 1971/08/07  Age: 46 y.o. MRN: [id number]  CC: Congestion, dizziness, chest tightness  HPI:  46 year old male presents for an acute visit with the above complaints.  Patient states that he's been sick for the past 2 days. He said severe sinus congestion. He reports associated dizziness. He's also had associated chest tightness. No reports of cough. No documented fever. He does report that he's been "hot and cold". He states that his muscles have been achy. No known exacerbating or relieving factors. No other associated symptoms. No other complaints or concerns at this time.   Social Hx   Social History   Social History  . Marital status: Married    Spouse name: N/A  . Number of children: N/A  . Years of education: N/A   Social History Main Topics  . Smoking status: Current Every Day Smoker    Packs/day: 1.00  . Smokeless tobacco: Never Used  . Alcohol use 0.0 oz/week  . Drug use: No  . Sexual activity: Not Asked   Other Topics Concern  . None   Social History Narrative  . None    Review of Systems  Constitutional: Positive for chills. Negative for fever.  HENT: Positive for congestion.   Respiratory: Positive for chest tightness.   Neurological: Positive for dizziness.   Objective:  BP (!) 148/98   Pulse 70   Temp 97.6 F (36.4 C) (Oral)   Wt 234 lb 4 oz (106.3 kg)   SpO2 97%   BMI 30.49 kg/m   BP/Weight 02/01/2017 09/06/2016 05/07/2016  Systolic BP 148 132 120  Diastolic BP 98 84 72  Wt. (Lbs) 234.25 233 229.8  BMI 30.49 30.32 29.9    Physical Exam  Constitutional:  Chronically ill-appearing male in no acute distress.  HENT:  Oropharynx with mild erythema. Poor dentition. Mild maxillary sinus tenderness to palpation.   Neck: Neck supple.  Cardiovascular: Normal rate and regular rhythm.   Pulmonary/Chest: Effort normal and breath sounds normal.  Lymphadenopathy:    He has no cervical  adenopathy.  Neurological: He is alert.  Vitals reviewed.   Lab Results  Component Value Date   WBC 10.0 03/22/2016   HGB 15.4 03/22/2016   HCT 45.4 03/22/2016   PLT 342.0 03/22/2016   GLUCOSE 99 09/06/2016   ALT 26 09/06/2016   AST 20 09/06/2016   NA 140 09/06/2016   K 3.8 09/06/2016   CL 104 09/06/2016   CREATININE 1.25 09/06/2016   BUN 14 09/06/2016   CO2 27 09/06/2016   TSH 0.64 03/22/2016   HGBA1C 5.7 02/07/2016    Assessment & Plan:   Problem List Items Addressed This Visit    Sinusitis    New acute problem. Treating with Doxy.      Relevant Medications   doxycycline (VIBRA-TABS) 100 MG tablet      Meds ordered this encounter  Medications  . doxycycline (VIBRA-TABS) 100 MG tablet    Sig: Take 1 tablet (100 mg total) by mouth 2 (two) times daily.    Dispense:  14 tablet    Refill:  0    Follow-up: PRN  Everlene OtherJayce Rosetta Rupnow DO [facility] [place]

## 2017-02-01 NOTE — Telephone Encounter (Signed)
Letter sent out to pt. 

## 2017-02-01 NOTE — Telephone Encounter (Signed)
Please advise 

## 2017-02-01 NOTE — Progress Notes (Signed)
Pre visit review using our clinic review tool, if applicable. No additional management support is needed unless otherwise documented below in the visit note. 

## 2017-02-01 NOTE — Telephone Encounter (Signed)
Pt has scheduled an appt on 4/18. Pt states that it is hard for him to get here because [place] of [place].

## 2017-02-01 NOTE — Telephone Encounter (Signed)
Faxed, please send letter stating he will be dismissed if no showing again

## 2017-02-01 NOTE — Telephone Encounter (Signed)
Pt has no showed three times since 05/2016. Pt does not have a follow up. He was advised at appt today that he would need one. \   ALPRAZolam (XANAX) 1 MG tablet 90 tablet 0 01/02/2017   lisinopril (PRINIVIL,ZESTRIL) 40 MG tablet 90 tablet 1 08/06/2016

## 2017-02-01 NOTE — Patient Instructions (Signed)
Doxycycline twice/day for 7 days.  Take care  Dr. Adriana Simasook

## 2017-02-01 NOTE — Telephone Encounter (Signed)
I will refill medications. Please send him a letter stating that if he no-shows again he will be dismissed from the practice. He needs to keep his appointment as scheduled to receive further medication refills.

## 2017-02-27 ENCOUNTER — Encounter: Payer: Self-pay | Admitting: Family Medicine

## 2017-02-27 ENCOUNTER — Ambulatory Visit (INDEPENDENT_AMBULATORY_CARE_PROVIDER_SITE_OTHER): Payer: Self-pay | Admitting: Family Medicine

## 2017-02-27 VITALS — BP 140/100 | HR 94 | Temp 97.9°F | Wt 228.6 lb

## 2017-02-27 DIAGNOSIS — G4730 Sleep apnea, unspecified: Secondary | ICD-10-CM

## 2017-02-27 DIAGNOSIS — I10 Essential (primary) hypertension: Secondary | ICD-10-CM

## 2017-02-27 DIAGNOSIS — F419 Anxiety disorder, unspecified: Secondary | ICD-10-CM

## 2017-02-27 LAB — COMPREHENSIVE METABOLIC PANEL
ALT: 23 U/L (ref 0–53)
AST: 21 U/L (ref 0–37)
Albumin: 4.4 g/dL (ref 3.5–5.2)
Alkaline Phosphatase: 72 U/L (ref 39–117)
BILIRUBIN TOTAL: 0.5 mg/dL (ref 0.2–1.2)
BUN: 13 mg/dL (ref 6–23)
CO2: 28 mEq/L (ref 19–32)
CREATININE: 1.17 mg/dL (ref 0.40–1.50)
Calcium: 9.7 mg/dL (ref 8.4–10.5)
Chloride: 105 mEq/L (ref 96–112)
GFR: 70.92 mL/min (ref >60.00)
GLUCOSE: 93 mg/dL (ref 70–99)
Potassium: 4.5 mEq/L (ref 3.5–5.1)
Sodium: 139 mEq/L (ref 135–145)
Total Protein: 7.6 g/dL (ref 6.0–8.3)

## 2017-02-27 MED ORDER — HYDRALAZINE HCL 50 MG PO TABS: 50.0000 mg | ORAL_TABLET | Freq: Four times a day (QID) | ORAL | 3 refills | Status: DC

## 2017-02-27 MED ORDER — HYDROCHLOROTHIAZIDE 25 MG PO TABS: 25.0000 mg | ORAL_TABLET | Freq: Every day | ORAL | 3 refills | Status: DC

## 2017-02-27 MED ORDER — ALPRAZOLAM 1 MG PO TABS: 1.0000 mg | ORAL_TABLET | Freq: Three times a day (TID) | ORAL | 2 refills | Status: DC

## 2017-02-27 NOTE — Patient Instructions (Signed)
Nice to see you. We will increase your hydralazine to 4 times daily. You will continue your other blood pressure medications. Please continue to monitor it at home. We will try to arrange for you to see a therapist and have a sleep study.

## 2017-02-27 NOTE — Assessment & Plan Note (Signed)
Stable. Refilled Xanax. We'll try to get him to see a therapist as this was not arranged after his last visit.

## 2017-02-27 NOTE — Progress Notes (Signed)
  [name], MD Phone: [phone]  Jason Romero is a 46 y.o. male who presents today for f/u.  HYPERTENSION  Disease Monitoring  Home BP Monitoring similar to today Chest pain- no    Dyspnea- no Medications  Compliance-  Taking clonidine, hydralazine, HCTZ, lisinopril, metoprolol.  Edema- no  Anxiety: Notes it still there. He feels anxious when he has to hurry or could try to get things done. He is taking Xanax 3 times daily. He tried Wellbutrin though this is dizzy. He is not on this anymore. No depression.  Sleep apnea: Has not undergone sleep study. He does snore. Possible apneic episodes though he is unsure. No tiredness. He does wake well rested.  PMH: smoker   ROS see history of present illness  Objective  Physical Exam Vitals:   02/27/17 0845  BP: (!) 140/100  Pulse: 94  Temp: 97.9 F (36.6 C)    BP Readings from Last 3 Encounters:  02/27/17 (!) 140/100  02/01/17 (!) 148/98  09/06/16 132/84   Wt Readings from Last 3 Encounters:  02/27/17 228 lb 9.6 oz (103.7 kg)  02/01/17 234 lb 4 oz (106.3 kg)  09/06/16 233 lb (105.7 kg)    Physical Exam  Constitutional: No distress.  Cardiovascular: Normal rate, regular rhythm and normal heart sounds.   Pulmonary/Chest: Effort normal and breath sounds normal.  Musculoskeletal: He exhibits no edema.  Neurological: He is alert. Gait normal.  Skin: Skin is warm and dry. He is not diaphoretic.  Psychiatric: Affect normal.     Assessment/Plan: Please see individual problem list.  Sleep apnea We'll have our referral coordinator work on getting the sleep study set up for him. He does not have insurance currently and he would like to know the price to determine if he wants to do this prior to getting his insurance back  Anxiety Stable. Refilled Xanax. We'll try to get him to see a therapist as this was not arranged after his last visit.  Essential hypertension Not at goal. We'll increase his hydralazine to 4  times daily dosing. He'll continue his other blood pressure medications. He'll follow-up in one month for recheck.   Orders Placed This Encounter  Procedures  . Comp Met (CMET)    Meds ordered this encounter  Medications  . hydrochlorothiazide (HYDRODIURIL) 25 MG tablet    Sig: Take 1 tablet (25 mg total) by mouth daily.    Dispense:  90 tablet    Refill:  3  . hydrALAZINE (APRESOLINE) 50 MG tablet    Sig: Take 1 tablet (50 mg total) by mouth 4 (four) times daily.    Dispense:  120 tablet    Refill:  3  . ALPRAZolam (XANAX) 1 MG tablet    Sig: Take 1 tablet (1 mg total) by mouth 3 (three) times daily.    Dispense:  90 tablet    Refill:  2    [name], MD [place]

## 2017-02-27 NOTE — Assessment & Plan Note (Signed)
Not at goal. We'll increase his hydralazine to 4 times daily dosing. He'll continue his other blood pressure medications. He'll follow-up in one month for recheck.

## 2017-02-27 NOTE — Progress Notes (Signed)
Pre visit review using our clinic review tool, if applicable. No additional management support is needed unless otherwise documented below in the visit note. 

## 2017-02-27 NOTE — Assessment & Plan Note (Signed)
We'll have our referral coordinator work on getting the sleep study set up for him. He does not have insurance currently and he would like to know the price to determine if he wants to do this prior to getting his insurance back

## 2017-03-01 NOTE — Progress Notes (Signed)
Unable to reach patient per phone, sent letter to notify

## 2017-03-11 ENCOUNTER — Emergency Department (HOSPITAL_COMMUNITY)
Admission: EM | Admit: 2017-03-11 | Discharge: 2017-03-11 | Disposition: A | Discharge status: Home / Self Care | Payer: Self-pay | Attending: Emergency Medicine | Admitting: Emergency Medicine

## 2017-03-11 ENCOUNTER — Encounter (HOSPITAL_COMMUNITY): Payer: Self-pay

## 2017-03-11 DIAGNOSIS — I10 Essential (primary) hypertension: Secondary | ICD-10-CM | POA: Insufficient documentation

## 2017-03-11 DIAGNOSIS — R55 Syncope and collapse: Secondary | ICD-10-CM | POA: Insufficient documentation

## 2017-03-11 DIAGNOSIS — Z79899 Other long term (current) drug therapy: Secondary | ICD-10-CM | POA: Insufficient documentation

## 2017-03-11 DIAGNOSIS — F17200 Nicotine dependence, unspecified, uncomplicated: Secondary | ICD-10-CM | POA: Insufficient documentation

## 2017-03-11 DIAGNOSIS — I252 Old myocardial infarction: Secondary | ICD-10-CM | POA: Insufficient documentation

## 2017-03-11 DIAGNOSIS — Z7982 Long term (current) use of aspirin: Secondary | ICD-10-CM | POA: Insufficient documentation

## 2017-03-11 DIAGNOSIS — J449 Chronic obstructive pulmonary disease, unspecified: Secondary | ICD-10-CM | POA: Insufficient documentation

## 2017-03-11 LAB — I-STAT CHEM 8, ED
BUN: 21 mg/dL — ABNORMAL HIGH (ref 6–20)
CREATININE: 1.1 mg/dL (ref 0.61–1.24)
Calcium, Ion: 1.16 mmol/L (ref 1.15–1.40)
Chloride: 108 mmol/L (ref 101–111)
Glucose, Bld: 83 mg/dL (ref 65–99)
HEMATOCRIT: 42 % (ref 39.0–52.0)
HEMOGLOBIN: 14.3 g/dL (ref 13.0–17.0)
POTASSIUM: 4.7 mmol/L (ref 3.5–5.1)
SODIUM: 141 mmol/L (ref 135–145)
TCO2: 28 mmol/L (ref 0–100)

## 2017-03-11 LAB — CBC WITH DIFFERENTIAL/PLATELET
BASOS ABS: 0.1 10*3/uL (ref 0.0–0.1)
BASOS PCT: 1 %
EOS ABS: 0.4 10*3/uL (ref 0.0–0.7)
EOS PCT: 4 %
HEMATOCRIT: 42 % (ref 39.0–52.0)
Hemoglobin: 13.6 g/dL (ref 13.0–17.0)
Lymphocytes Relative: 29 %
Lymphs Abs: 3 10*3/uL (ref 0.7–4.0)
MCH: 29.3 pg (ref 26.0–34.0)
MCHC: 32.4 g/dL (ref 30.0–36.0)
MCV: 90.5 fL (ref 78.0–100.0)
MONO ABS: 0.6 10*3/uL (ref 0.1–1.0)
MONOS PCT: 6 %
NEUTROS PCT: 60 %
Neutro Abs: 6.2 10*3/uL (ref 1.7–7.7)
PLATELETS: 302 10*3/uL (ref 150–400)
RBC: 4.64 MIL/uL (ref 4.22–5.81)
RDW: 14.2 % (ref 11.5–15.5)
WBC: 10.2 10*3/uL (ref 4.0–10.5)

## 2017-03-11 LAB — I-STAT TROPONIN, ED: TROPONIN I, POC: 0 ng/mL (ref 0.00–0.08)

## 2017-03-11 LAB — CBG MONITORING, ED: GLUCOSE-CAPILLARY: 87 mg/dL (ref 65–99)

## 2017-03-11 MED ORDER — SODIUM CHLORIDE 0.9 % IV BOLUS (SEPSIS)
500.0000 mL | Freq: Once | INTRAVENOUS | Status: AC
  Administered 2017-03-11: 500 mL via INTRAVENOUS

## 2017-03-11 NOTE — ED Provider Notes (Signed)
[facility]-EMERGENCY DEPT Provider Note   CSN: [id number] Arrival date & time: 03/11/17  1431  By signing my name below, I, Phillips Climes, attest that this documentation has been prepared under the direction and in the presence of Benjiman Core, MD.  Electronically Signed: Phillips Climes, Scribe. 03/11/2017. 3:17 PM.  History   Chief Complaint Chief Complaint  Patient presents with  . Weakness   Jason Romero is a 46 y.o. male with a PMHx of HTN, COPD, MI and a-fib who presents to the Emergency Department s/p a witnessed near-syncopal episode.  Pt is accompanying wife in the ED and was walking in from smoking a cigarette, when he experienced abdominal pain, followed by a sudden episode of weakness. Per nurses, pt appeared pale and diaphoretic. Pt has not taken anything PO today except for his morning medications.   Pt has experienced similar episodes in the past with no clear etiology.   The history is provided by the patient and medical records (ED staff). No language interpreter was used.   Past Medical History:  Diagnosis Date  . A-fib (HCC)   . Chronic headaches   . COPD (chronic obstructive pulmonary disease) (HCC)   . GERD (gastroesophageal reflux disease)   . History of blood transfusion   . Hypertension   . MI (myocardial infarction) (HCC)   . Seizures [facility])     Patient Active Problem List   Diagnosis Date Noted  . Sleep apnea 09/06/2016  . Tobacco abuse 09/06/2016  . 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 Lightheadedness 03/22/2016  . Unintentional weight loss 03/22/2016  . Essential hypertension 02/12/2016  . Anxiety 02/12/2016  . Migraines 02/12/2016  . Syncope 02/12/2016    Past Surgical History:  Procedure Laterality Date  . CHOLECYSTECTOMY       Home Medications    Prior to Admission medications   Medication Sig Start Date End Date Taking? Authorizing Provider  acetaminophen (TYLENOL) 500 MG tablet Take 1,000 mg by mouth every 6 (six) hours as needed  for mild pain or headache.    Historical Provider, MD  ALPRAZolam Prudy Feeler) 1 MG tablet Take 1 tablet (1 mg total) by mouth 3 (three) times daily. 02/27/17   Glori Luis, MD  aspirin EC 81 MG tablet Take 81 mg by mouth daily.    Historical Provider, MD  buPROPion (WELLBUTRIN SR) 150 MG 12 hr tablet Take 150 mg (one tablet) by mouth daily for 3 days, then take 150 mg (1 tablet) by mouth twice daily 09/06/16   Glori Luis, MD  cloNIDine (CATAPRES) 0.1 MG tablet Take 0.2 mg by mouth 2 (two) times daily.     Historical Provider, MD  cloNIDine (CATAPRES) 0.2 MG tablet Take by mouth. Reported on 02/07/2016    Historical Provider, MD  hydrALAZINE (APRESOLINE) 50 MG tablet Take 1 tablet (50 mg total) by mouth 4 (four) times daily. 02/27/17   Glori Luis, MD  hydrochlorothiazide (HYDRODIURIL) 25 MG tablet Take 1 tablet (25 mg total) by mouth daily. 02/27/17   Glori Luis, MD  lisinopril (PRINIVIL,ZESTRIL) 40 MG tablet Take 1 tablet (40 mg total) by mouth daily. 02/01/17   Glori Luis, MD  metoprolol (LOPRESSOR) 100 MG tablet Take 100 mg by mouth 2 (two) times daily.    Historical Provider, MD  nitroGLYCERIN (NITROSTAT) 0.4 MG SL tablet Place 0.4 mg under the tongue every 5 (five) minutes as needed for chest pain.    Historical Provider, MD  pantoprazole (PROTONIX) 40 MG  tablet Take 40 mg by mouth daily.    Historical Provider, MD    Family History Family History  Problem Relation Age of Onset  . Alcoholism    . Arthritis    . Breast cancer    . Heart disease Mother     Social History Social History  Substance Use Topics  . Smoking status: Current Every Day Smoker    Packs/day: 1.00  . Smokeless tobacco: Never Used  . Alcohol use 0.0 oz/week     Allergies   Hydrocodone and Ibuprofen   Review of Systems Review of Systems  Constitutional: Positive for diaphoresis.  Gastrointestinal: Positive for abdominal pain.  Neurological: Positive for syncope.     Physical  Exam  Updated Vital Signs BP (!) 108/56 (BP Location: Right Arm)   Pulse (!) 52   Resp 18   Ht  (1.88 m)   Wt 228 lb (103.4 kg)   SpO2 100%   BMI 29.27 kg/m   Physical Exam  Constitutional: He is oriented to person, place, and time. He appears well-developed and well-nourished. No distress.  HENT:  Head: Normocephalic and atraumatic.  Eyes: Conjunctivae are normal.  Cardiovascular: Normal rate and regular rhythm.   Pulmonary/Chest: Effort normal.  Neurological: He is alert and oriented to person, place, and time.  Skin: Skin is warm and dry.  Psychiatric: He has a normal mood and affect.  Nursing note and vitals reviewed.   ED Treatments / Results  DIAGNOSTIC STUDIES: Oxygen Saturation is 100% on room air, normal by my interpretation.    COORDINATION OF CARE: 2:52 PM Discussed treatment plan with pt at bedside and pt agreed to plan. Pulse 67 in room.   Labs (all labs ordered are listed, but only abnormal results are displayed) Labs Reviewed  I-STAT CHEM 8, ED - Abnormal; Notable for the following:       Result Value   BUN 21 (*)    All other components within normal limits  CBC WITH DIFFERENTIAL/PLATELET  CBG MONITORING, ED  I-STAT TROPOININ, ED    EKG  EKG Interpretation  Date/Time:  Monday March 11 2017 14:34:13 EDT Ventricular Rate:  52 PR Interval:    QRS Duration: 98 QT Interval:  472 QTC Calculation: 439 R Axis:   36 Text Interpretation:  Sinus rhythm Left ventricular hypertrophy Inferior infarct, age indeterminate Confirmed by Rubin Payor  MD, Wessley Emert [phone]) on 03/11/2017 2:44:45 PM       Radiology No results found.  Procedures Procedures (including critical care time)  Medications Ordered in ED Medications  sodium chloride 0.9 % bolus 500 mL (500 mLs Intravenous New Bag/Given 03/11/17 1459)     Initial Impression / Assessment and Plan / ED Course  I have reviewed the triage vital signs and the nursing notes.  Pertinent labs & imaging  results that were available during my care of the patient were reviewed by me and considered in my medical decision making (see chart for details).     Patient with near syncopal episode. History of same and has reportedly been worked up extensively for the past.Patient feels better and is tolerating orals. Lab work overall reassuring. Will discharge home. Ambulated without difficulty.  Final Clinical Impressions(s) / ED Diagnoses   Final diagnoses:  Near syncope    New Prescriptions New Prescriptions   No medications on file   I personally performed the services described in this documentation, which was scribed in my presence. The recorded information has been reviewed and is accurate.  Benjiman Core, MD 03/11/17 1540

## 2017-03-11 NOTE — ED Notes (Signed)
Pt ambulated around nurses station with a steady gait. Pt had no complaints while ambulating.

## 2017-03-11 NOTE — ED Triage Notes (Signed)
Pt while walking from the lobby to go and see his wife suddenly became very pale and weak and held on the railing for support pt was placed in a wheelchair and brought back to a room for eval pt appeared diaphoretic at the time

## 2017-04-12 ENCOUNTER — Telehealth: Payer: Self-pay

## 2017-04-12 ENCOUNTER — Telehealth: Payer: Self-pay | Admitting: Family Medicine

## 2017-04-12 ENCOUNTER — Ambulatory Visit: Payer: Self-pay | Admitting: Family Medicine

## 2017-04-12 DIAGNOSIS — Z0289 Encounter for other administrative examinations: Secondary | ICD-10-CM

## 2017-04-12 NOTE — Telephone Encounter (Signed)
Patient dismissed from [facility] by Marikay AlarEric Sonnenberg MD, effective April 12, 2017. Dismissal letter sent out by certified / registered mail.  daj

## 2017-04-12 NOTE — Telephone Encounter (Signed)
-----   Message from Glori LuisEric G Sonnenberg, MD sent at 04/12/2017  2:27 PM EDT ----- Regarding: dismissal Patient no showed his appointment today. He has no showed 4 appointments at this point. He was previously mailed a letter advising if he had 3 or more no shows dismissal could occur. Can we proceed with dismissal? Thanks. Eric.

## 2017-04-12 NOTE — Telephone Encounter (Signed)
Patient per the chart has no showed "5" times, dimissal letter written and sent to HIM. Thanks

## 2017-05-20 NOTE — Telephone Encounter (Signed)
Patient received and signed the domestic return receipt verifying delivery of certified letter on April 16, 2017. Article number [id number] [phone] [id number]

## 2017-07-18 ENCOUNTER — Ambulatory Visit (INDEPENDENT_AMBULATORY_CARE_PROVIDER_SITE_OTHER): Payer: Self-pay | Admitting: Physician Assistant

## 2020-09-11 ENCOUNTER — Encounter (HOSPITAL_COMMUNITY): Payer: Self-pay | Admitting: *Deleted

## 2020-09-11 ENCOUNTER — Other Ambulatory Visit: Payer: Self-pay

## 2020-09-11 ENCOUNTER — Inpatient Hospital Stay (HOSPITAL_COMMUNITY)
Admission: EM | Admit: 2020-09-11 | Discharge: 2020-09-20 | DRG: 064 | Disposition: A | Discharge status: Rehabilitation Facility | Payer: Medicaid Other | Attending: Internal Medicine | Admitting: Internal Medicine

## 2020-09-11 ENCOUNTER — Inpatient Hospital Stay (HOSPITAL_COMMUNITY): Payer: Medicaid Other

## 2020-09-11 ENCOUNTER — Emergency Department (HOSPITAL_COMMUNITY): Payer: Medicaid Other

## 2020-09-11 DIAGNOSIS — R471 Dysarthria and anarthria: Secondary | ICD-10-CM | POA: Diagnosis present

## 2020-09-11 DIAGNOSIS — J449 Chronic obstructive pulmonary disease, unspecified: Secondary | ICD-10-CM | POA: Diagnosis present

## 2020-09-11 DIAGNOSIS — F17210 Nicotine dependence, cigarettes, uncomplicated: Secondary | ICD-10-CM | POA: Diagnosis present

## 2020-09-11 DIAGNOSIS — I674 Hypertensive encephalopathy: Secondary | ICD-10-CM | POA: Diagnosis present

## 2020-09-11 DIAGNOSIS — I639 Cerebral infarction, unspecified: Secondary | ICD-10-CM | POA: Diagnosis not present

## 2020-09-11 DIAGNOSIS — R29810 Facial weakness: Secondary | ICD-10-CM | POA: Diagnosis not present

## 2020-09-11 DIAGNOSIS — I610 Nontraumatic intracerebral hemorrhage in hemisphere, subcortical: Principal | ICD-10-CM | POA: Diagnosis present

## 2020-09-11 DIAGNOSIS — K219 Gastro-esophageal reflux disease without esophagitis: Secondary | ICD-10-CM | POA: Diagnosis present

## 2020-09-11 DIAGNOSIS — I161 Hypertensive emergency: Secondary | ICD-10-CM | POA: Diagnosis present

## 2020-09-11 DIAGNOSIS — D72829 Elevated white blood cell count, unspecified: Secondary | ICD-10-CM

## 2020-09-11 DIAGNOSIS — I482 Chronic atrial fibrillation, unspecified: Secondary | ICD-10-CM | POA: Diagnosis present

## 2020-09-11 DIAGNOSIS — R4701 Aphasia: Secondary | ICD-10-CM | POA: Diagnosis present

## 2020-09-11 DIAGNOSIS — R29725 NIHSS score 25: Secondary | ICD-10-CM | POA: Diagnosis present

## 2020-09-11 DIAGNOSIS — R0902 Hypoxemia: Secondary | ICD-10-CM | POA: Diagnosis not present

## 2020-09-11 DIAGNOSIS — E785 Hyperlipidemia, unspecified: Secondary | ICD-10-CM | POA: Diagnosis present

## 2020-09-11 DIAGNOSIS — R569 Unspecified convulsions: Secondary | ICD-10-CM | POA: Diagnosis present

## 2020-09-11 DIAGNOSIS — I10 Essential (primary) hypertension: Secondary | ICD-10-CM | POA: Diagnosis not present

## 2020-09-11 DIAGNOSIS — I6389 Other cerebral infarction: Secondary | ICD-10-CM

## 2020-09-11 DIAGNOSIS — Z9049 Acquired absence of other specified parts of digestive tract: Secondary | ICD-10-CM

## 2020-09-11 DIAGNOSIS — I69351 Hemiplegia and hemiparesis following cerebral infarction affecting right dominant side: Secondary | ICD-10-CM | POA: Diagnosis not present

## 2020-09-11 DIAGNOSIS — I69253 Hemiplegia and hemiparesis following other nontraumatic intracranial hemorrhage affecting right non-dominant side: Secondary | ICD-10-CM | POA: Diagnosis not present

## 2020-09-11 DIAGNOSIS — E876 Hypokalemia: Secondary | ICD-10-CM | POA: Diagnosis not present

## 2020-09-11 DIAGNOSIS — I2510 Atherosclerotic heart disease of native coronary artery without angina pectoris: Secondary | ICD-10-CM | POA: Diagnosis present

## 2020-09-11 DIAGNOSIS — Z23 Encounter for immunization: Secondary | ICD-10-CM

## 2020-09-11 DIAGNOSIS — G9389 Other specified disorders of brain: Secondary | ICD-10-CM

## 2020-09-11 DIAGNOSIS — E870 Hyperosmolality and hypernatremia: Secondary | ICD-10-CM | POA: Diagnosis present

## 2020-09-11 DIAGNOSIS — R1310 Dysphagia, unspecified: Secondary | ICD-10-CM | POA: Diagnosis present

## 2020-09-11 DIAGNOSIS — R000 Tachycardia, unspecified: Secondary | ICD-10-CM | POA: Diagnosis not present

## 2020-09-11 DIAGNOSIS — Z79899 Other long term (current) drug therapy: Secondary | ICD-10-CM

## 2020-09-11 DIAGNOSIS — Z6832 Body mass index (BMI) 32.0-32.9, adult: Secondary | ICD-10-CM

## 2020-09-11 DIAGNOSIS — I361 Nonrheumatic tricuspid (valve) insufficiency: Secondary | ICD-10-CM

## 2020-09-11 DIAGNOSIS — Z9114 Patient's other noncompliance with medication regimen: Secondary | ICD-10-CM

## 2020-09-11 DIAGNOSIS — R4781 Slurred speech: Secondary | ICD-10-CM | POA: Diagnosis not present

## 2020-09-11 DIAGNOSIS — I6930 Unspecified sequelae of cerebral infarction: Secondary | ICD-10-CM | POA: Diagnosis present

## 2020-09-11 DIAGNOSIS — I619 Nontraumatic intracerebral hemorrhage, unspecified: Secondary | ICD-10-CM

## 2020-09-11 DIAGNOSIS — Z20822 Contact with and (suspected) exposure to covid-19: Secondary | ICD-10-CM | POA: Diagnosis present

## 2020-09-11 DIAGNOSIS — G936 Cerebral edema: Secondary | ICD-10-CM | POA: Diagnosis present

## 2020-09-11 DIAGNOSIS — Z7982 Long term (current) use of aspirin: Secondary | ICD-10-CM

## 2020-09-11 DIAGNOSIS — I69391 Dysphagia following cerebral infarction: Secondary | ICD-10-CM

## 2020-09-11 DIAGNOSIS — Z8249 Family history of ischemic heart disease and other diseases of the circulatory system: Secondary | ICD-10-CM

## 2020-09-11 DIAGNOSIS — G4733 Obstructive sleep apnea (adult) (pediatric): Secondary | ICD-10-CM | POA: Diagnosis present

## 2020-09-11 DIAGNOSIS — Z885 Allergy status to narcotic agent status: Secondary | ICD-10-CM

## 2020-09-11 DIAGNOSIS — R29818 Other symptoms and signs involving the nervous system: Secondary | ICD-10-CM

## 2020-09-11 DIAGNOSIS — I340 Nonrheumatic mitral (valve) insufficiency: Secondary | ICD-10-CM

## 2020-09-11 DIAGNOSIS — Z888 Allergy status to other drugs, medicaments and biological substances status: Secondary | ICD-10-CM

## 2020-09-11 DIAGNOSIS — I252 Old myocardial infarction: Secondary | ICD-10-CM

## 2020-09-11 DIAGNOSIS — F39 Unspecified mood [affective] disorder: Secondary | ICD-10-CM | POA: Diagnosis present

## 2020-09-11 DIAGNOSIS — S062X0S Diffuse traumatic brain injury without loss of consciousness, sequela: Secondary | ICD-10-CM

## 2020-09-11 LAB — COMPREHENSIVE METABOLIC PANEL
ALT: 22 U/L (ref 0–44)
AST: 21 U/L (ref 15–41)
Albumin: 4 g/dL (ref 3.5–5.0)
Alkaline Phosphatase: 67 U/L (ref 38–126)
Anion gap: 13 (ref 5–15)
BUN: 10 mg/dL (ref 6–20)
CO2: 23 mmol/L (ref 22–32)
Calcium: 8.9 mg/dL (ref 8.9–10.3)
Chloride: 103 mmol/L (ref 98–111)
Creatinine, Ser: 1.08 mg/dL (ref 0.61–1.24)
GFR, Estimated: >60 mL/min (ref >60)
Glucose, Bld: 114 mg/dL — ABNORMAL HIGH (ref 70–99)
Potassium: 2.8 mmol/L — ABNORMAL LOW (ref 3.5–5.1)
Sodium: 139 mmol/L (ref 135–145)
Total Bilirubin: 0.6 mg/dL (ref 0.3–1.2)
Total Protein: 7.3 g/dL (ref 6.5–8.1)

## 2020-09-11 LAB — CBC
HCT: 41.6 % (ref 39.0–52.0)
Hemoglobin: 13.5 g/dL (ref 13.0–17.0)
MCH: 28.2 pg (ref 26.0–34.0)
MCHC: 32.5 g/dL (ref 30.0–36.0)
MCV: 86.8 fL (ref 80.0–100.0)
Platelets: 346 10*3/uL (ref 150–400)
RBC: 4.79 MIL/uL (ref 4.22–5.81)
RDW: 14.6 % (ref 11.5–15.5)
WBC: 10.6 10*3/uL — ABNORMAL HIGH (ref 4.0–10.5)
nRBC: 0 % (ref 0.0–0.2)

## 2020-09-11 LAB — URINALYSIS, ROUTINE W REFLEX MICROSCOPIC
Bilirubin Urine: NEGATIVE
Glucose, UA: NEGATIVE mg/dL
Ketones, ur: NEGATIVE mg/dL
Leukocytes,Ua: NEGATIVE
Nitrite: NEGATIVE
Protein, ur: NEGATIVE mg/dL
Specific Gravity, Urine: 1.005 (ref 1.005–1.030)
pH: 7 (ref 5.0–8.0)

## 2020-09-11 LAB — MRSA PCR SCREENING: MRSA by PCR: NEGATIVE

## 2020-09-11 LAB — PROTIME-INR
INR: 1.1 (ref 0.8–1.2)
Prothrombin Time: 13.5 seconds (ref 11.4–15.2)

## 2020-09-11 LAB — DIFFERENTIAL
Abs Immature Granulocytes: 0.06 10*3/uL (ref 0.00–0.07)
Basophils Absolute: 0.1 10*3/uL (ref 0.0–0.1)
Basophils Relative: 1 %
Eosinophils Absolute: 0.3 10*3/uL (ref 0.0–0.5)
Eosinophils Relative: 3 %
Immature Granulocytes: 1 %
Lymphocytes Relative: 24 %
Lymphs Abs: 2.6 10*3/uL (ref 0.7–4.0)
Monocytes Absolute: 1 10*3/uL (ref 0.1–1.0)
Monocytes Relative: 10 %
Neutro Abs: 6.6 10*3/uL (ref 1.7–7.7)
Neutrophils Relative %: 61 %

## 2020-09-11 LAB — RAPID URINE DRUG SCREEN, HOSP PERFORMED
Amphetamines: POSITIVE — AB
Barbiturates: NOT DETECTED
Benzodiazepines: NOT DETECTED
Cocaine: NOT DETECTED
Opiates: NOT DETECTED
Tetrahydrocannabinol: POSITIVE — AB

## 2020-09-11 LAB — ECHOCARDIOGRAM COMPLETE
Height: 73.5 in
S' Lateral: 4.3 cm
Weight: 4014.14 oz

## 2020-09-11 LAB — RESPIRATORY PANEL BY RT PCR (FLU A&B, COVID)
Influenza A by PCR: NEGATIVE
Influenza B by PCR: NEGATIVE
SARS Coronavirus 2 by RT PCR: NEGATIVE

## 2020-09-11 LAB — I-STAT CHEM 8, ED
BUN: 10 mg/dL (ref 6–20)
Calcium, Ion: 1.06 mmol/L — ABNORMAL LOW (ref 1.15–1.40)
Chloride: 104 mmol/L (ref 98–111)
Creatinine, Ser: 1.2 mg/dL (ref 0.61–1.24)
Glucose, Bld: 111 mg/dL — ABNORMAL HIGH (ref 70–99)
HCT: 40 % (ref 39.0–52.0)
Hemoglobin: 13.6 g/dL (ref 13.0–17.0)
Potassium: 2.8 mmol/L — ABNORMAL LOW (ref 3.5–5.1)
Sodium: 142 mmol/L (ref 135–145)
TCO2: 23 mmol/L (ref 22–32)

## 2020-09-11 LAB — APTT: aPTT: 34 seconds (ref 24–36)

## 2020-09-11 LAB — ETHANOL: Alcohol, Ethyl (B): <10 mg/dL (ref <10)

## 2020-09-11 LAB — CBG MONITORING, ED: Glucose-Capillary: 114 mg/dL — ABNORMAL HIGH (ref 70–99)

## 2020-09-11 LAB — SODIUM: Sodium: 139 mmol/L (ref 135–145)

## 2020-09-11 LAB — HIV ANTIBODY (ROUTINE TESTING W REFLEX): HIV Screen 4th Generation wRfx: NONREACTIVE

## 2020-09-11 MED ORDER — LABETALOL HCL 5 MG/ML IV SOLN
5.0000 mg | Freq: Once | INTRAVENOUS | Status: AC
  Administered 2020-09-11: 5 mg via INTRAVENOUS
  Filled 2020-09-11: qty 4

## 2020-09-11 MED ORDER — PNEUMOCOCCAL VAC POLYVALENT 25 MCG/0.5ML IJ INJ
0.5000 mL | INJECTION | INTRAMUSCULAR | Status: AC
  Administered 2020-09-15: 0.5 mL via INTRAMUSCULAR
  Filled 2020-09-11: qty 0.5

## 2020-09-11 MED ORDER — SODIUM CHLORIDE 3 % IV SOLN
INTRAVENOUS | Status: DC
  Filled 2020-09-11 (×10): qty 500

## 2020-09-11 MED ORDER — ACETAMINOPHEN 650 MG RE SUPP: 650.0000 mg | RECTAL | Status: DC | PRN

## 2020-09-11 MED ORDER — PANTOPRAZOLE SODIUM 40 MG IV SOLR
40.0000 mg | Freq: Every day | INTRAVENOUS | Status: DC
  Administered 2020-09-11: 40 mg via INTRAVENOUS
  Filled 2020-09-11: qty 40

## 2020-09-11 MED ORDER — POTASSIUM CHLORIDE 10 MEQ/100ML IV SOLN
10.0000 meq | INTRAVENOUS | Status: AC
  Administered 2020-09-11 (×6): 10 meq via INTRAVENOUS
  Filled 2020-09-11 (×6): qty 100

## 2020-09-11 MED ORDER — CLEVIDIPINE BUTYRATE 0.5 MG/ML IV EMUL
INTRAVENOUS | Status: AC
  Filled 2020-09-11: qty 50

## 2020-09-11 MED ORDER — NICARDIPINE HCL IN NACL 20-0.86 MG/200ML-% IV SOLN: 0.0000 mg/h | INTRAVENOUS | Status: DC

## 2020-09-11 MED ORDER — SENNOSIDES-DOCUSATE SODIUM 8.6-50 MG PO TABS
1.0000 | ORAL_TABLET | Freq: Two times a day (BID) | ORAL | Status: DC
  Administered 2020-09-12: 1 via ORAL
  Filled 2020-09-11: qty 1

## 2020-09-11 MED ORDER — ACETAMINOPHEN 160 MG/5ML PO SOLN
650.0000 mg | ORAL | Status: DC | PRN
  Administered 2020-09-13: 650 mg
  Filled 2020-09-11: qty 20.3

## 2020-09-11 MED ORDER — SODIUM CHLORIDE 0.9 % IV SOLN: INTRAVENOUS | Status: DC

## 2020-09-11 MED ORDER — STROKE: EARLY STAGES OF RECOVERY BOOK
Freq: Once | Status: AC
  Filled 2020-09-11: qty 1

## 2020-09-11 MED ORDER — CHLORHEXIDINE GLUCONATE CLOTH 2 % EX PADS
6.0000 | MEDICATED_PAD | Freq: Every day | CUTANEOUS | Status: DC
  Administered 2020-09-11 – 2020-09-20 (×8): 6 via TOPICAL

## 2020-09-11 MED ORDER — CLEVIDIPINE BUTYRATE 0.5 MG/ML IV EMUL
0.0000 mg/h | INTRAVENOUS | Status: DC
  Administered 2020-09-11 (×2): 21 mg/h via INTRAVENOUS
  Administered 2020-09-11: 16 mg/h via INTRAVENOUS
  Administered 2020-09-11 (×7): 21 mg/h via INTRAVENOUS
  Administered 2020-09-12 (×3): 30 mg/h via INTRAVENOUS
  Administered 2020-09-12: 27 mg/h via INTRAVENOUS
  Administered 2020-09-12 (×4): 30 mg/h via INTRAVENOUS
  Administered 2020-09-12 (×2): 27 mg/h via INTRAVENOUS
  Administered 2020-09-12: 25 mg/h via INTRAVENOUS
  Administered 2020-09-13 (×2): 27 mg/h via INTRAVENOUS
  Administered 2020-09-13: 29 mg/h via INTRAVENOUS
  Administered 2020-09-13: 30 mg/h via INTRAVENOUS
  Administered 2020-09-13: 4 mg/h via INTRAVENOUS
  Administered 2020-09-13: 27 mg/h via INTRAVENOUS
  Administered 2020-09-13: 15 mg/h via INTRAVENOUS
  Administered 2020-09-13: 21 mg/h via INTRAVENOUS
  Administered 2020-09-14 (×2): 10 mg/h via INTRAVENOUS
  Administered 2020-09-14: 4 mg/h via INTRAVENOUS
  Administered 2020-09-14: 8 mg/h via INTRAVENOUS
  Administered 2020-09-15: 4 mg/h via INTRAVENOUS
  Filled 2020-09-11 (×4): qty 50
  Filled 2020-09-11: qty 100
  Filled 2020-09-11 (×8): qty 50
  Filled 2020-09-11 (×2): qty 100
  Filled 2020-09-11: qty 50
  Filled 2020-09-11 (×3): qty 100
  Filled 2020-09-11 (×5): qty 50
  Filled 2020-09-11: qty 100
  Filled 2020-09-11: qty 150
  Filled 2020-09-11 (×6): qty 50

## 2020-09-11 MED ORDER — IOHEXOL 350 MG/ML SOLN
75.0000 mL | Freq: Once | INTRAVENOUS | Status: AC | PRN
  Administered 2020-09-11: 75 mL via INTRAVENOUS

## 2020-09-11 MED ORDER — INFLUENZA VAC SPLIT QUAD 0.5 ML IM SUSY
0.5000 mL | PREFILLED_SYRINGE | INTRAMUSCULAR | Status: AC
  Administered 2020-09-15: 0.5 mL via INTRAMUSCULAR
  Filled 2020-09-11: qty 0.5

## 2020-09-11 MED ORDER — ACETAMINOPHEN 325 MG PO TABS
650.0000 mg | ORAL_TABLET | ORAL | Status: DC | PRN
  Filled 2020-09-11 (×2): qty 2

## 2020-09-11 NOTE — ED Notes (Signed)
BOTH PUPILS EQUAL AND REACT TO LIGHT  2.0

## 2020-09-11 NOTE — ED Provider Notes (Signed)
[facility] EMERGENCY DEPARTMENT Provider Note   CSN: [id number] Arrival date & time: 09/11/20  0458     History No chief complaint on file.   Jason Romero is a 49 y.o. male.  49 year old male last known well around 0 400 when he was ambulating without difficulty.  He walked to the bathroom and while going to the bathroom his wife notes did not come back to bed and went to check on him and she found him slumped to the side with right-sided deficits.  EMS was called.  On their arrival he had right facial droop and garbled speech.  Code stroke was initiated and patient was brought here for evaluation.  Patient was also found to have atrial fibrillation with no known history of the same.    Level V Caveat 2/2 Altered Mental Status and acuity of condition and inability to speak    Past Medical History:  Diagnosis Date  . A-fib (HCC)   . Chronic headaches   . COPD (chronic obstructive pulmonary disease) (HCC)   . GERD (gastroesophageal reflux disease)   . History of blood transfusion   . Hypertension   . MI (myocardial infarction) (HCC)   . Seizures [facility])     Patient Active Problem List   Diagnosis Date Noted  . Sleep apnea 09/06/2016  . Tobacco abuse 09/06/2016  . 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 Lightheadedness 03/22/2016  . Unintentional weight loss 03/22/2016  . Essential hypertension 02/12/2016  . Anxiety 02/12/2016  . Migraines 02/12/2016  . Syncope 02/12/2016    Past Surgical History:  Procedure Laterality Date  . CHOLECYSTECTOMY         Family History  Problem Relation Age of Onset  . Alcoholism Unknown   . Arthritis Unknown   . Breast cancer Unknown   . Heart disease Mother     Social History   Tobacco Use  . Smoking status: Current Every Day Smoker    Packs/day: 1.00  . Smokeless tobacco: Never Used  Substance Use Topics  . Alcohol use: Yes    Alcohol/week: 0.0 standard drinks  . Drug use: No    Types: Marijuana    Home  Medications Prior to Admission medications   Medication Sig Start Date End Date Taking? Authorizing Provider  acetaminophen (TYLENOL) 500 MG tablet Take 1,000 mg by mouth every 6 (six) hours as needed for mild pain or headache.    [provider]  ALPRAZolam Prudy Feeler) 1 MG tablet Take 1 tablet (1 mg total) by mouth 3 (three) times daily. 02/27/17   Glori Luis, MD  aspirin EC 81 MG tablet Take 81 mg by mouth daily.    [provider]  buPROPion (WELLBUTRIN SR) 150 MG 12 hr tablet Take 150 mg (one tablet) by mouth daily for 3 days, then take 150 mg (1 tablet) by mouth twice daily 09/06/16   Glori Luis, MD  cloNIDine (CATAPRES) 0.1 MG tablet Take 0.2 mg by mouth 2 (two) times daily.     [provider]  cloNIDine (CATAPRES) 0.2 MG tablet Take by mouth. Reported on 02/07/2016    [provider]  hydrALAZINE (APRESOLINE) 50 MG tablet Take 1 tablet (50 mg total) by mouth 4 (four) times daily. 02/27/17   Glori Luis, MD  hydrochlorothiazide (HYDRODIURIL) 25 MG tablet Take 1 tablet (25 mg total) by mouth daily. 02/27/17   Glori Luis, MD  lisinopril (PRINIVIL,ZESTRIL) 40 MG tablet Take 1 tablet (40 mg total) by  mouth daily. 02/01/17   Glori Luis, MD  metoprolol (LOPRESSOR) 100 MG tablet Take 100 mg by mouth 2 (two) times daily.    [provider]  nitroGLYCERIN (NITROSTAT) 0.4 MG SL tablet Place 0.4 mg under the tongue every 5 (five) minutes as needed for chest pain.    [provider]  pantoprazole (PROTONIX) 40 MG tablet Take 40 mg by mouth daily.    [provider]    Allergies    Hydrocodone and Ibuprofen  Review of Systems   Review of Systems  Unable to perform ROS: Mental status change   Level V Caveat 2/2 Altered Mental Status and acuity of condition and inability to speak  Physical Exam Updated Vital Signs BP (!) 166/85   Pulse 74   Resp (!) 27   Ht 6' 1.5" (1.867 m)   Wt 113.8 kg   SpO2 95%    BMI 32.65 kg/m   Physical Exam Vitals and nursing note reviewed.  Constitutional:      Appearance: He is well-developed.  HENT:     Head: Normocephalic and atraumatic.     Mouth/Throat:     Mouth: Mucous membranes are moist.  Eyes:     Pupils: Pupils are equal, round, and reactive to light.  Cardiovascular:     Rate and Rhythm: Normal rate.  Pulmonary:     Effort: Pulmonary effort is normal. No respiratory distress.  Abdominal:     General: There is no distension.  Musculoskeletal:        General: Normal range of motion.     Cervical back: Normal range of motion.  Skin:    General: Skin is warm and dry.  Neurological:     Mental Status: He is alert.     Comments: Right facial droop Dysarthria flacid r weakness     ED Results / Procedures / Treatments   Labs (all labs ordered are listed, but only abnormal results are displayed) Labs Reviewed  CBC - Abnormal; Notable for the following components:      Result Value   WBC 10.6 (*)    All other components within normal limits  I-STAT CHEM 8, ED - Abnormal; Notable for the following components:   Potassium 2.8 (*)    Glucose, Bld 111 (*)    Calcium, Ion 1.06 (*)    All other components within normal limits  CBG MONITORING, ED - Abnormal; Notable for the following components:   Glucose-Capillary 114 (*)    All other components within normal limits  PROTIME-INR  APTT  DIFFERENTIAL  ETHANOL  COMPREHENSIVE METABOLIC PANEL  RAPID URINE DRUG SCREEN, HOSP PERFORMED  URINALYSIS, ROUTINE W REFLEX MICROSCOPIC    EKG None  Radiology CT HEAD CODE STROKE WO CONTRAST  Result Date: 09/11/2020 CLINICAL DATA:  Code stroke. EXAM: CT HEAD WITHOUT CONTRAST TECHNIQUE: Contiguous axial images were obtained from the base of the skull through the vertex without intravenous contrast. COMPARISON:  None. FINDINGS: Brain: Intraparenchymal hematoma in the left basal ganglia measures 4.2 x 2.4 x 4.5 cm (volume = 24 cm^3). Mild  surrounding edema. No midline shift. No hydrocephalus. Brain parenchyma is otherwise normal. Vascular: No abnormal hyperdensity of the major intracranial arteries or dural venous sinuses. No intracranial atherosclerosis. Skull: The visualized skull base, calvarium and extracranial soft tissues are normal. Sinuses/Orbits: No fluid levels or advanced mucosal thickening of the visualized paranasal sinuses. No mastoid or middle ear effusion. The orbits are normal. IMPRESSION: 1. Intraparenchymal hematoma in the left basal  ganglia, measuring 24 mL. 2. No midline shift or hydrocephalus. Critical Value/emergent results were called by telephone at the time of interpretation on 09/11/2020 at 5:16 am to provider Marisue Humble, who verbally acknowledged these results. Electronically Signed   By: Deatra Robinson M.D.   On: 09/11/2020 05:16    Procedures .Critical Care Performed by: Marily Memos, MD Authorized by: Marily Memos, MD   Critical care provider statement:    Critical care time (minutes):  45   Critical care was necessary to treat or prevent imminent or life-threatening deterioration of the following conditions:  CNS failure or compromise   Critical care was time spent personally by me on the following activities:  Discussions with consultants, evaluation of patient's response to treatment, examination of patient, ordering and performing treatments and interventions, ordering and review of laboratory studies, ordering and review of radiographic studies, pulse oximetry, re-evaluation of patient's condition, obtaining history from patient or surrogate and review of old charts   (including critical care time)  Medications Ordered in ED Medications  clevidipine (CLEVIPREX) 0.5 MG/ML infusion (has no administration in time range)  labetalol (NORMODYNE) injection 5 mg (has no administration in time range)  clevidipine (CLEVIPREX) infusion 0.5 mg/mL (16 mg/hr Intravenous New Bag/Given 09/11/20 0532)     ED Course  I have reviewed the triage vital signs and the nursing notes.  Pertinent labs & imaging results that were available during my care of the patient were reviewed by me and considered in my medical decision making (see chart for details).    MDM Rules/Calculators/A&P                          Patient presented as a code stroke.  Exam consistent.  Patient rushed back to the CT scanner immediately upon arrival.  CT scans reviewed by myself and neurology in the scanner and showed a significant left temporal bleed.  Continuous antihypertensive infusion was initiated.  Patient will be admitted to the ICU.  Final Clinical Impression(s) / ED Diagnoses Final diagnoses:  None    Rx / DC Orders ED Discharge Orders    None       Eowyn Tabone, Barbara Cower, MD 09/11/20 [phone]

## 2020-09-11 NOTE — Code Documentation (Signed)
Responded to Code Stroke called at 0440 for R sided droop and R sided paralysis, LSN-0400. Pt arrived at 0458, CBG-114, NIH-25, CT head-intraparenchymal hematoma in L basal ganglia. Pt taken back to ED room and started on cleviprex. Plan to admit to ICU.

## 2020-09-11 NOTE — ED Notes (Signed)
cleviprex 16mg  iv

## 2020-09-11 NOTE — ED Notes (Signed)
THE PT DID NOT  PASS SWALLOW SCREEN CANNOT FOLLOW DIRECTIONS WELL ENOUIGH

## 2020-09-11 NOTE — Progress Notes (Signed)
STROKE TEAM PROGRESS NOTE   INTERVAL HISTORY His wife is at the bedside.  Pt drowsy sleepy able to briefly open eyes on voice and pain stimulation but not able to maintain eye opening or following commands. BP under control with cleviprex. Still in afib on tele. Wife admitted that he did not take his BP meds at home, no antithrombotics, did not follow up with his doctors, smokes 2PPD and use THC.   OBJECTIVE Vitals:   09/11/20 0642 09/11/20 0645 09/11/20 0700 09/11/20 0712  BP: (!) 127/108 (!) 136/96 129/85   Pulse: (!) 117 (!) 107 (!) 101   Resp: (!) 24 (!) 26 (!) 30   Temp:    98 F (36.7 C)  TempSrc:    Oral  SpO2: 96% 97% 94%   Weight:      Height:        CBC:  Recent Labs  Lab 09/11/20 0506 09/11/20 0507  WBC  --  10.6*  NEUTROABS  --  6.6  HGB 13.6 13.5  HCT 40.0 41.6  MCV  --  86.8  PLT  --  346    Basic Metabolic Panel:  Recent Labs  Lab 09/11/20 0506 09/11/20 0507  NA 142 139  K 2.8* 2.8*  CL 104 103  CO2  --  23  GLUCOSE 111* 114*  BUN 10 10  CREATININE 1.20 1.08  CALCIUM  --  8.9    Lipid Panel: No results found for: CHOL, TRIG, HDL, CHOLHDL, VLDL, LDLCALC HgbA1c:  Lab Results  Component Value Date   HGBA1C 5.7 02/07/2016   Urine Drug Screen:     Component Value Date/Time   LABOPIA NEGATIVE 02/03/2012 2215   COCAINSCRNUR NEGATIVE 02/03/2012 2215   LABBENZ NEGATIVE 02/03/2012 2215   AMPHETMU NEGATIVE 02/03/2012 2215   THCU POSITIVE 02/03/2012 2215   LABBARB NEGATIVE 02/03/2012 2215    Alcohol Level     Component Value Date/Time   ETH <10 09/11/2020 0506    IMAGING   CT HEAD CODE STROKE WO CONTRAST  Result Date: 09/11/2020 CLINICAL DATA:  Code stroke. EXAM: CT HEAD WITHOUT CONTRAST TECHNIQUE: Contiguous axial images were obtained from the base of the skull through the vertex without intravenous contrast. COMPARISON:  None. FINDINGS: Brain: Intraparenchymal hematoma in the left basal ganglia measures 4.2 x 2.4 x 4.5 cm (volume = 24  cm^3). Mild surrounding edema. No midline shift. No hydrocephalus. Brain parenchyma is otherwise normal. Vascular: No abnormal hyperdensity of the major intracranial arteries or dural venous sinuses. No intracranial atherosclerosis. Skull: The visualized skull base, calvarium and extracranial soft tissues are normal. Sinuses/Orbits: No fluid levels or advanced mucosal thickening of the visualized paranasal sinuses. No mastoid or middle ear effusion. The orbits are normal. IMPRESSION: 1. Intraparenchymal hematoma in the left basal ganglia, measuring 24 mL. 2. No midline shift or hydrocephalus. Critical Value/emergent results were called by telephone at the time of interpretation on 09/11/2020 at 5:16 am to provider Marisue Humble, who verbally acknowledged these results. Electronically Signed   By: Deatra Robinson M.D.   On: 09/11/2020 05:16    Transthoracic Echocardiogram  00/00/2021 Pending   CTA head and neck Pending   PHYSICAL EXAM  Temp:  [98 F (36.7 C)] 98 F (36.7 C) (10/31 0712) Pulse Rate:  [55-120] 85 (10/31 0900) Resp:  [10-33] 16 (10/31 0900) BP: (101-204)/(76-147) 123/77 (10/31 0900) SpO2:  [89 %-100 %] 91 % (10/31 0900) Weight:  [113.8 kg] 113.8 kg (10/31 0721)  General - Well nourished, well  developed, drowsy sleepy, barely open eyes with voice.  Ophthalmologic - fundi not visualized due to noncooperation.  Cardiovascular - irregularly irregular heart rate and rhythm.  Neuro - drowsy sleepy only able to briefly open eyes with voice and pain stimulation. Non verbal and not following commands. Eyes left gaze preference, not cross midline. Blinking to visual threat on the left but not on the right. PERRL. Right facial droop. Right UE slight extension on pain stimulation. Right LE slight withdraw to pain stimulation. Spontaneous moving LUE against gravity, LLE knee flexion. Sensation, coordination and gait not tested.   ASSESSMENT/PLAN [name] is a 49 y.o. male  with history of A. fib not on AC, seizure, MI, hypertension, smoker, OSA presenting with right-sided weakness, right facial droop, slurred speech. He did not receive IV t-PA due to ICH.   ICH - left BG ICH, likely hypertensive etiology  CT Head - left BG ICH  CT head repeat pending  CTA H&N pending  2D Echo - pending  Sars Corona Virus 2 negative  LDL - pending  HgbA1c - pending  UDS positive for THC and phentermine  VTE prophylaxis -SCDs  aspirin 81 mg daily prior to admission, now on No antithrombotic due to ICH  Ongoing aggressive stroke risk factor management  Therapy recommendations:  pending  Disposition:  Pending  Hypertensive emergency  Home BP meds: Not compliant with medication  Current BP meds: Cleviprex  Stable now . BP goal less than 140 . Long-term BP goal normotensive  Chronic afib  Per wife, patient has chronic A. fib not on stronger blood thinners  Lost follow-up with cardiology as outpatient  Currently still in A. fib but rate controlled  No antithrombotic at this time due to ICH  Tobacco abuse  Current smoker, 2PPD  Smoking cessation counseling will be provided  Dysphagia  NPO  Speech to follow  May need a cortrak tomorrow  Other Stroke Risk Factors  ETOH use, advised to drink no more than 1-2 drink(s) a day  Obesity, Body mass index is 32.65 kg/m., recommend weight loss, diet and exercise as appropriate   Coronary artery disease/MI  Obstructive sleep apnea  Substance Abuse - THC and amphetamine positive on UDS.  Wife admitted patient using THC, however not knowing about a phentermine  Other Active Problems  Code status - full code  Hospital day # 0  This patient is critically ill due to ICH, chronic A. fib not on AC, hypertensive emergency, drowsy sleepy and at significant risk of neurological worsening, death form hematoma expansion, cerebral edema, brain herniation, heart failure, stroke, hypertensive  encephalopathy, seizure. This patient's care requires constant monitoring of vital signs, hemodynamics, respiratory and cardiac monitoring, review of multiple databases, neurological assessment, discussion with family, other specialists and medical decision making of high complexity. I spent 30 minutes of neurocritical care time in the care of this patient. I had long discussion with wife at bedside, updated pt current condition, treatment plan and potential prognosis, and answered all the questions.  She expressed understanding and appreciation.   Marvel Plan, MD PhD Stroke Neurology 09/11/2020 11:19 AM      To contact Stroke Continuity provider, please refer to [email or web address]. After hours, contact General Neurology

## 2020-09-11 NOTE — ED Notes (Signed)
PERMISSION FROM NEUROLOGIST TO INCREASE THE CLEVOPREX TO 21

## 2020-09-11 NOTE — ED Triage Notes (Signed)
THE PT WAS LSN AT 0400 APPROX 440  THE PTS SPEECH WAS GARBLED  NO MOVEMENT ON THE RT SIDE RT ARM ORT LEG WITH FACIAL DROOP   THE PT ARRIVED ALERT NO OTHER SYMPTOMS  LT FOOT MOVING AROUND C/O A HEADACHE WHEN HE WAS ASKED  SOME OF HIS WORDS CLEAR OTHER GARBLED  UNABLE TO TELL ME THE DAY MONTH  YEAR OR ANYTHING ELSE  VERY JITTERY

## 2020-09-11 NOTE — ED Notes (Signed)
clevipres at 6  bp 195/127

## 2020-09-11 NOTE — ED Notes (Signed)
clevipres 12 mg ic  bp

## 2020-09-11 NOTE — Progress Notes (Addendum)
Brief Neuro Progress Note  The patient's neuro exam declined on several attempts at arousal and he is no longer responding. NIHSS increased by 1 point.    Repeat NCT head showed:  Increased size of left cerebral hemorrhage from 5.6--->6.1cm. 3 mm rightward midline shift with partial effacement of the left lateral ventricle. Stable minimal intraventricular extension involving the left occipital horn. No new focus of intracranial hemorrhage or new focal hypodensity; patent basilar cisterns.    Plan: Start Na 3% IV for 24 hours and continue to observe.  Hold Na 0.9% IV for now. Serum Na goal 150-155. STAT NCT head for further neuro decline and emergent neurosurgery consultation.

## 2020-09-11 NOTE — ED Notes (Signed)
clevipres 78m iv

## 2020-09-11 NOTE — ED Notes (Signed)
Jason Romero, wife, [phone] would like an update when available

## 2020-09-11 NOTE — ED Notes (Signed)
Clevip[res 10mg  ic  bp 196.139

## 2020-09-11 NOTE — ED Notes (Signed)
ic rt a-c  clevipres started at 2 at [phone]

## 2020-09-11 NOTE — Progress Notes (Signed)
  Echocardiogram 2D Echocardiogram has been performed.  Roosvelt Maser F 09/11/2020, 2:39 PM

## 2020-09-11 NOTE — H&P (Signed)
Neurology H&P  CC: R sided droop and R sided paralysis  History is obtained from: EMS and partly from patient.  HPI: HECTOR TAFT is a 49 y.o. male PMHx as reviewed below was last known well ~0400 was ambulating without difficulty and went to the bathroom. His wife got worried when he did not come back to bed and she went to check on him and found him slumped to side with right-sided deficits. EMS was called and on arrival he had right facial droop and garbled speech. Code stroke initiated.   EMS reports atrial fibrillation en route with no known history of the same.  LKW: 0400 tpa given?: No, hemorrhage IR Thrombectomy? No, Modified Rankin Scale: 0-Completely asymptomatic and back to baseline post- stroke NIHSS: 25  ROS: A complete ROS was performed and is negative except as noted in the HPI.   Past Medical History:  Diagnosis Date  . A-fib (HCC)   . Chronic headaches   . COPD (chronic obstructive pulmonary disease) (HCC)   . GERD (gastroesophageal reflux disease)   . History of blood transfusion   . Hypertension   . MI (myocardial infarction) (HCC)   . Seizures (HCC)     Family History  Problem Relation Age of Onset  . Alcoholism Unknown   . Arthritis Unknown   . Breast cancer Unknown   . Heart disease Mother     Social History:  reports that he has been smoking. He has been smoking about 1.00 pack per day. He has never used smokeless tobacco. He reports current alcohol use. He reports that he does not use drugs.   Prior to Admission medications   Medication Sig Start Date End Date Taking? Authorizing Provider  acetaminophen (TYLENOL) 500 MG tablet Take 1,000 mg by mouth every 6 (six) hours as needed for mild pain or headache.    [provider]  ALPRAZolam Prudy Feeler) 1 MG tablet Take 1 tablet (1 mg total) by mouth 3 (three) times daily. 02/27/17   Glori Luis, MD  aspirin EC 81 MG tablet Take 81 mg by mouth daily.    [provider]  buPROPion  (WELLBUTRIN SR) 150 MG 12 hr tablet Take 150 mg (one tablet) by mouth daily for 3 days, then take 150 mg (1 tablet) by mouth twice daily 09/06/16   Glori Luis, MD  cloNIDine (CATAPRES) 0.1 MG tablet Take 0.2 mg by mouth 2 (two) times daily.     [provider]  cloNIDine (CATAPRES) 0.2 MG tablet Take by mouth. Reported on 02/07/2016    [provider]  hydrALAZINE (APRESOLINE) 50 MG tablet Take 1 tablet (50 mg total) by mouth 4 (four) times daily. 02/27/17   Glori Luis, MD  hydrochlorothiazide (HYDRODIURIL) 25 MG tablet Take 1 tablet (25 mg total) by mouth daily. 02/27/17   Glori Luis, MD  lisinopril (PRINIVIL,ZESTRIL) 40 MG tablet Take 1 tablet (40 mg total) by mouth daily. 02/01/17   Glori Luis, MD  metoprolol (LOPRESSOR) 100 MG tablet Take 100 mg by mouth 2 (two) times daily.    [provider]  nitroGLYCERIN (NITROSTAT) 0.4 MG SL tablet Place 0.4 mg under the tongue every 5 (five) minutes as needed for chest pain.    [provider]  pantoprazole (PROTONIX) 40 MG tablet Take 40 mg by mouth daily.    [provider]   Exam: Current vital signs: BP 138/86   Pulse 93   Resp (!) 24   Ht  6' 1.5" (1.867 m)   Wt 113.8 kg   SpO2 93%   BMI 32.65 kg/m   Physical Exam  Constitutional: Appears well-developed and well-nourished.  Psych: unable to test as patient moving/nervous and not completely following commands Eyes: No scleral injection HENT: No OP obstrucion Head: Normocephalic.  Cardiovascular: Normal rate and regular rhythm.  Respiratory: Effort normal and breath sounds normal to anterior ascultation GI: Soft.  No distension. There is no tenderness.  Skin: WDI  Neuro: Mental Status: Patient is awake, alert, oriented to person. Patient is not able to give a clear and coherent history. No signs of aphasia or neglect. Cranial Nerves: II: Visual Fields unable to test as patient moving/nervous and not completely  following commands III,IV, VI: EOMI limited gaze - patient moving/nervous and not completely following commands.  V: Facial sensation is decreased on right VII: Facial movement is asymmetric right.  VIII: hearing is intact to voice X: Uvula elevates symmetrically XI: Shoulder shrug is symmetric. XII: tongue is midline without atrophy or fasciculations.  Motor: Tone is normal. Bulk is normal. Right upper and lower extremity flaccid. Sensory: unable to test as patient moving/nervous and not completely following commands Deep Tendon Reflexes: biceps and patellae untestable as patient moving and tense.  Plantars: Mute on right Cerebellar: FNF and HKS are intact bilaterally.   I have reviewed the images obtained: NCT head showed intraparenchymal hematoma in the left basal ganglia measures 4.2 x 2.4 x 4.5 cm (volume = 24 cm^3).   Assessment: JONES VIVIANI is a 49 y.o. male presents with left basal ganglia hemorrhagic stroke and hypertensive emergency.  Labetalol 5mg  stat Start clevidipine. On reassessment SBP still above 140 and authorized increased dose to achieve better control.  Impression:  Intraparenchymal hematoma in the left basal ganglia. ICH score 1 hypertensive emergency Hypertensive encephalopathy  Plan: Admit to ICU and initiate hemorrhagic stroke care protocol. Elevate HOB SBP <140 Continue clevidipine infusion. Aspiration precautions. No antiplatelet or anticoagulation for now. SCDs Docusate PPI Aspiration precautions PT/OT/ST Repeat CT head non contrast in 6 hours. Monitor for neuro decline and call neurosurgery. Stroke neurology will continue to follow.    This patient is critically ill and at significant risk of neurological worsening, death and care requires constant monitoring of vital signs, hemodynamics,respiratory and cardiac monitoring, neurological assessment, discussion with family, other specialists and medical decision making of high complexity. I  spent 75 minutes of neurocritical care time  in the care of  this patient. This was time spent independent of any time provided by nurse practitioner or PA.  Electronically signed by: Dr. Pager: [phone] 09/11/2020, 6:20 AM

## 2020-09-11 NOTE — ED Notes (Signed)
LMOST MAXED  ON CLEVOPREX    21  LABATLOL

## 2020-09-11 NOTE — ED Notes (Signed)
clevipres 18

## 2020-09-11 NOTE — ED Notes (Signed)
THE PT HAS CALLED IN ABOUT HER HUSBAND AND SHE IS ON THE WAY HERE  SHE REPORTS THAT HE HAS HIGH BP AND DOES NOT TAKE HIS BP MEDS   FOR A LONG TIME

## 2020-09-11 NOTE — ED Notes (Signed)
Echo at bedside

## 2020-09-12 ENCOUNTER — Inpatient Hospital Stay (HOSPITAL_COMMUNITY): Payer: Medicaid Other

## 2020-09-12 DIAGNOSIS — G936 Cerebral edema: Secondary | ICD-10-CM

## 2020-09-12 LAB — BASIC METABOLIC PANEL
Anion gap: 12 (ref 5–15)
BUN: 10 mg/dL (ref 6–20)
CO2: 19 mmol/L — ABNORMAL LOW (ref 22–32)
Calcium: 8.6 mg/dL — ABNORMAL LOW (ref 8.9–10.3)
Chloride: 109 mmol/L (ref 98–111)
Creatinine, Ser: 0.93 mg/dL (ref 0.61–1.24)
GFR, Estimated: >60 mL/min (ref >60)
Glucose, Bld: 116 mg/dL — ABNORMAL HIGH (ref 70–99)
Potassium: 3.5 mmol/L (ref 3.5–5.1)
Sodium: 140 mmol/L (ref 135–145)

## 2020-09-12 LAB — CBC
HCT: 39.5 % (ref 39.0–52.0)
Hemoglobin: 12.9 g/dL — ABNORMAL LOW (ref 13.0–17.0)
MCH: 28.3 pg (ref 26.0–34.0)
MCHC: 32.7 g/dL (ref 30.0–36.0)
MCV: 86.6 fL (ref 80.0–100.0)
Platelets: 353 10*3/uL (ref 150–400)
RBC: 4.56 MIL/uL (ref 4.22–5.81)
RDW: 14.9 % (ref 11.5–15.5)
WBC: 14.4 10*3/uL — ABNORMAL HIGH (ref 4.0–10.5)
nRBC: 0 % (ref 0.0–0.2)

## 2020-09-12 LAB — PHOSPHORUS
Phosphorus: 2.3 mg/dL — ABNORMAL LOW (ref 2.5–4.6)
Phosphorus: 1.7 mg/dL — ABNORMAL LOW (ref 2.5–4.6)

## 2020-09-12 LAB — LIPID PANEL
Cholesterol: 122 mg/dL (ref 0–200)
HDL: 40 mg/dL — ABNORMAL LOW (ref >40)
LDL Cholesterol: 61 mg/dL (ref 0–99)
Total CHOL/HDL Ratio: 3.1 RATIO
Triglycerides: 104 mg/dL (ref <150)
VLDL: 21 mg/dL (ref 0–40)

## 2020-09-12 LAB — HEMOGLOBIN A1C
Hgb A1c MFr Bld: 5.6 % (ref 4.8–5.6)
Mean Plasma Glucose: 114.02 mg/dL

## 2020-09-12 LAB — TRIGLYCERIDES: Triglycerides: 114 mg/dL (ref <150)

## 2020-09-12 LAB — MAGNESIUM
Magnesium: 1.9 mg/dL (ref 1.7–2.4)
Magnesium: 2 mg/dL (ref 1.7–2.4)

## 2020-09-12 LAB — SODIUM
Sodium: 142 mmol/L (ref 135–145)
Sodium: 144 mmol/L (ref 135–145)
Sodium: 145 mmol/L (ref 135–145)

## 2020-09-12 MED ORDER — LABETALOL HCL 100 MG PO TABS
100.0000 mg | ORAL_TABLET | Freq: Three times a day (TID) | ORAL | Status: DC
  Filled 2020-09-12: qty 1

## 2020-09-12 MED ORDER — SENNOSIDES 8.8 MG/5ML PO SYRP
5.0000 mL | ORAL_SOLUTION | Freq: Two times a day (BID) | ORAL | Status: DC
  Administered 2020-09-13 – 2020-09-20 (×11): 5 mL
  Filled 2020-09-12 (×14): qty 5

## 2020-09-12 MED ORDER — OSMOLITE 1.5 CAL PO LIQD
1000.0000 mL | ORAL | Status: DC
  Administered 2020-09-12 – 2020-09-20 (×6): 1000 mL
  Filled 2020-09-12 (×7): qty 1000

## 2020-09-12 MED ORDER — ORAL CARE MOUTH RINSE
15.0000 mL | Freq: Two times a day (BID) | OROMUCOSAL | Status: DC
  Administered 2020-09-13 – 2020-09-20 (×14): 15 mL via OROMUCOSAL

## 2020-09-12 MED ORDER — PANTOPRAZOLE SODIUM 40 MG PO PACK
40.0000 mg | PACK | Freq: Every day | ORAL | Status: DC
  Administered 2020-09-12 – 2020-09-20 (×9): 40 mg
  Filled 2020-09-12 (×9): qty 20

## 2020-09-12 MED ORDER — LABETALOL HCL 100 MG PO TABS
100.0000 mg | ORAL_TABLET | Freq: Three times a day (TID) | ORAL | Status: DC
  Administered 2020-09-12 – 2020-09-13 (×3): 100 mg
  Filled 2020-09-12 (×2): qty 1

## 2020-09-12 MED ORDER — PROSOURCE TF PO LIQD
45.0000 mL | Freq: Three times a day (TID) | ORAL | Status: DC
  Administered 2020-09-12 – 2020-09-20 (×24): 45 mL
  Filled 2020-09-12 (×23): qty 45

## 2020-09-12 MED ORDER — AMLODIPINE BESYLATE 10 MG PO TABS
10.0000 mg | ORAL_TABLET | Freq: Every day | ORAL | Status: DC
  Administered 2020-09-12 – 2020-09-20 (×9): 10 mg
  Filled 2020-09-12 (×9): qty 1

## 2020-09-12 MED ORDER — PROSOURCE TF PO LIQD: 45.0000 mL | Freq: Two times a day (BID) | ORAL | Status: DC

## 2020-09-12 MED ORDER — VITAL HIGH PROTEIN PO LIQD: 1000.0000 mL | ORAL | Status: DC

## 2020-09-12 MED ORDER — POTASSIUM & SODIUM PHOSPHATES 280-160-250 MG PO PACK
1.0000 | PACK | ORAL | Status: AC
  Administered 2020-09-12 – 2020-09-13 (×3): 1
  Filled 2020-09-12 (×3): qty 1

## 2020-09-12 MED ORDER — LISINOPRIL 20 MG PO TABS
20.0000 mg | ORAL_TABLET | Freq: Two times a day (BID) | ORAL | Status: DC
  Administered 2020-09-12 – 2020-09-20 (×17): 20 mg
  Filled 2020-09-12 (×17): qty 1

## 2020-09-12 MED ORDER — CHLORHEXIDINE GLUCONATE 0.12 % MT SOLN
15.0000 mL | Freq: Two times a day (BID) | OROMUCOSAL | Status: DC
  Administered 2020-09-12 – 2020-09-20 (×16): 15 mL via OROMUCOSAL
  Filled 2020-09-12 (×13): qty 15

## 2020-09-12 NOTE — Progress Notes (Signed)
Initial Nutrition Assessment  DOCUMENTATION CODES:   Not applicable  INTERVENTION:   Tube Feeding via Cortrak:  Osmolite 1.5 at 60 ml/hr Pro-Source TF 45 mL TID Provides 2280 kcals, 123 g of protein and 1094 mL free water  Pt receiving additional kcals from cleviprex   NUTRITION DIAGNOSIS:   Inadequate oral intake related to acute illness as evidenced by NPO status.   GOAL:   Patient will meet greater than or equal to 90% of their needs  MONITOR:   TF tolerance, Diet advancement, Labs, Weight trends  REASON FOR ASSESSMENT:   Ventilator    ASSESSMENT:   49 yo male admitted with ICH, likely HTN etiology. PMH includes seizure, MI, HTN, a.fib  11/1 Cortrak placed, TF initiated  Pt does not participate in assessment.. Wide at bedside who indicates pt has good appetite at home and she does not believe he has experienced any changes in weight recently. Pt usually eats 2 good meals per day plus snacks in between.   Pt currently on Hypertonic saline, sodium 142  Pt on cleviprex at 60 ml/hr    Labs: reviewed Meds: potassium and sodium phosphate   Diet Order:   Diet Order            Diet NPO time specified  Diet effective now                 EDUCATION NEEDS:   Not appropriate for education at this time  Skin:  Skin Assessment: Reviewed RN Assessment  Last BM:  PTA  Height:   Ht Readings from Last 1 Encounters:  09/11/20 6' 1.5" (1.867 m)    Weight:   Wt Readings from Last 1 Encounters:  09/11/20 113.8 kg    BMI:  Body mass index is 32.65 kg/m.  Estimated Nutritional Needs:   Kcal:  2200-2400 kcals  Protein:  115-130 g  Fluid:  >/= 2 L    Romelle Starcher MS, RDN, LDN, CNSC Registered Dietitian III Clinical Nutrition RD Pager and On-Call Pager Number Located in [place]

## 2020-09-12 NOTE — Progress Notes (Signed)
SLP Cancellation Note  Patient Details Name: Jason Romero MRN: [id number] DOB: 05-Jan-1971   Cancelled treatment:       Reason Eval/Treat Not Completed: Patient's level of consciousness. Per notes pts neurostatus declined last night. Will defer SLP eval until more stable.    Hernando Reali, [name] 09/12/2020, 7:59 AM

## 2020-09-12 NOTE — Progress Notes (Addendum)
STROKE TEAM PROGRESS NOTE   INTERVAL HISTORY Wife and RN are at bedside. Pt lying in bed, drowsy sleepy but arousable, still has left gaze preference and not following commands. Severe dysarthria with aphasia. Right hemiplegia. CT repeat overnight showed slight increased left BG ICH, 3% saline was started. Na 142. Did not pass swallow, will put on cortrak.   OBJECTIVE Vitals:   09/12/20 0645 09/12/20 0700 09/12/20 0800 09/12/20 0900  BP: (!) 122/94 (!) 141/79 135/90 137/83  Pulse: 77 78 86 86  Resp:   17 15  Temp:   98.9 F (37.2 C)   TempSrc:   Oral   SpO2: 94% 99% 94% 95%  Weight:      Height:       CBC:  Recent Labs  Lab 09/11/20 0507 09/12/20 0513  WBC 10.6* 14.4*  NEUTROABS 6.6  --   HGB 13.5 12.9*  HCT 41.6 39.5  MCV 86.8 86.6  PLT 346 353   Basic Metabolic Panel:  Recent Labs  Lab 09/11/20 0507 09/11/20 0507 09/11/20 2110 09/12/20 0513  NA 139   < > 139 140  K 2.8*  --   --  3.5  CL 103  --   --  109  CO2 23  --   --  19*  GLUCOSE 114*  --   --  116*  BUN 10  --   --  10  CREATININE 1.08  --   --  0.93  CALCIUM 8.9  --   --  8.6*   < > = values in this interval not displayed.   Lipid Panel:     Component Value Date/Time   CHOL 122 09/12/2020 0513   TRIG 104 09/12/2020 0513   TRIG 114 09/12/2020 0513   HDL 40 (L) 09/12/2020 0513   CHOLHDL 3.1 09/12/2020 0513   VLDL 21 09/12/2020 0513   LDLCALC 61 09/12/2020 0513   HgbA1c:  Lab Results  Component Value Date   HGBA1C 5.6 09/12/2020   Urine Drug Screen:     Component Value Date/Time   LABOPIA NONE DETECTED 09/11/2020 0728   COCAINSCRNUR NONE DETECTED 09/11/2020 0728   COCAINSCRNUR NEGATIVE 02/03/2012 2215   LABBENZ NONE DETECTED 09/11/2020 0728   AMPHETMU POSITIVE (A) 09/11/2020 0728   THCU POSITIVE (A) 09/11/2020 0728   LABBARB NONE DETECTED 09/11/2020 0728    Alcohol Level     Component Value Date/Time   ETH <10 09/11/2020 0506    IMAGING  CT HEAD CODE STROKE WO  CONTRAST 09/11/2020 1. Intraparenchymal hematoma in the left basal ganglia, measuring 24 mL. 2. No midline shift or hydrocephalus.   CT ANGIO HEAD W OR WO CONTRAST CT ANGIO NECK W OR WO CONTRAST 09/11/2020 Increase in size of left cerebral hemorrhage and surrounding edema. Mass effect remains mild. No abnormal vascularity in the region of hemorrhage or evidence of active contrast extravasation. No hemodynamically significant stenosis.   CT HEAD WO CONTRAST 09/11/2020 Slightly increased size of left cerebral hemorrhage measuring 6.1 cm, previously 5.6 cm. Minimal intraventricular extension involving the left occipital horn, unchanged. Rightward midline shift of 3 mm and partial left lateral ventricle effacement, unchanged.   ECHOCARDIOGRAM COMPLETE 09/11/2020 1. Left ventricular ejection fraction, by estimation, is 50 to 55%. The left ventricle has low normal function. The left ventricle has no regional wall motion abnormalities. There is mild left ventricular hypertrophy. Left ventricular diastolic parameters are consistent with Grade II diastolic dysfunction (pseudonormalization).   2. Right ventricular systolic function is normal.  The right ventricular size is normal.  3 3. Left atrial size was mildly dilated.   4. The mitral valve is normal in structure. Mild to moderate mitral valve regurgitation. No evidence of mitral stenosis.   5. The aortic valve is normal in structure. Aortic valve regurgitation is not visualized. No aortic stenosis is present.   6. The inferior vena cava is normal in size with greater than 50% respiratory variability, suggesting right atrial pressure of 3 mmHg.    PHYSICAL EXAM    Temp:  [97.4 F (36.3 C)-98.9 F (37.2 C)] 98.9 F (37.2 C) (11/01 0800) Pulse Rate:  [68-169] 86 (11/01 0900) Resp:  [0-46] 15 (11/01 0900) BP: (101-152)/(73-110) 137/83 (11/01 0900) SpO2:  [87 %-100 %] 95 % (11/01 0900)  General - Well nourished, well developed, drowsy sleepy,  open eyes with voice and touch.  Ophthalmologic - fundi not visualized due to noncooperation.  Cardiovascular - irregularly irregular heart rate and rhythm.  Neuro - drowsy sleepy but able to open eyes with voice and tactile stimulation. Making sounds but intangible and not following commands. Eyes left gaze preference, not cross midline. Blinking to visual threat on the left but not on the right. PERRL. Right facial droop. Right UE slight extension on pain stimulation. Right LE slight withdraw to pain stimulation. Spontaneous moving LUE against gravity, LLE knee flexion. Sensation, coordination and gait not tested.   ASSESSMENT/PLAN [name] is a 49 y.o. male with history of A. fib not on AC, seizure, MI, hypertension, smoker, OSA presenting with right-sided weakness, right facial droop, slurred speech. He did not receive IV t-PA due to ICH.   ICH - left BG ICH, likely hypertensive etiology  CT Head - left BG ICH  CTA H&N increase L BG ICH & edema. Vasculature unremarkable.  CT head repeat slight increase L BG ICH, minimal IVH. R shift 38mm, partial L ventricle effacement.  CT repeat in am  2D Echo - EF 50-55%. Mild LVH. No source of embolus.  [employer] Virus 2 negative  LDL - 61  HgbA1c - 5.6  UDS positive for THC and amphetamine  VTE prophylaxis -SCDs  aspirin 81 mg daily prior to admission, now on No antithrombotic due to ICH  Ongoing aggressive stroke risk factor management  Therapy recommendations:  pending  Disposition:  Pending  Cerebral Edema  Increasing edema w/ midline shift per CT  CT repeat in am  Started on 3% saline at 50cc/hr  Goal 150-155   Na 139->140->142   Hypertensive emergency  Home BP meds: Not compliant with medication  maximized on Cleviprex  Stable on the high end  Will put on po BP meds once po access . SBP goal <140 . Long-term BP goal normotensive  Chronic afib  Per wife, patient has chronic A. fib not on  stronger blood thinners  Lost follow-up with cardiology as outpatient  Currently still in A. fib but rate controlled  No antithrombotic at this time due to ICH  Tobacco abuse  Current smoker, 2PPD  Smoking cessation counseling will be provided  Dysphagia  NPO  Did not pass swallow  Cortrak placement w/ TF   Leukocytosis   WBC 10.6->14.4. likely reactive.   CXR pending   UA neg   CBC monitoring  Other Stroke Risk Factors  ETOH use, advised to drink no more than 1-2 drink(s) a day  Obesity, Body mass index is 32.65 kg/m., recommend weight loss, diet and exercise as appropriate   Coronary  artery disease/MI  Obstructive sleep apnea  Substance Abuse - THC and amphetamine positive on UDS.  Wife admitted patient using THC, however not knowing about amphetamine  Other Active Problems  Code status - full code  Hx seizures    Hospital day # 1  This patient is critically ill due to large left basal ganglia ICH, cerebral edema, leukocytosis, dysphagia, substance abuse and at significant risk of neurological worsening, death form cerebral edema, brain herniation, status epilepticus, sepsis, aspiration pneumonia, respiratory failure. This patient's care requires constant monitoring of vital signs, hemodynamics, respiratory and cardiac monitoring, review of multiple databases, neurological assessment, discussion with family, other specialists and medical decision making of high complexity. I spent 35 minutes of neurocritical care time in the care of this patient. I had long discussion with wife at bedside, updated pt current condition, treatment plan and potential prognosis, and answered all the questions. She expressed understanding and appreciation.     Marvel Plan, MD PhD Stroke Neurology 09/12/2020 10:04 AM  To contact Stroke Continuity provider, please refer to [email or web address]. After hours, contact General Neurology

## 2020-09-12 NOTE — Progress Notes (Signed)
OT Cancellation Note  Patient Details Name: Jason Romero MRN: [id number] DOB: 1971/03/27   Cancelled Treatment:    Reason Eval/Treat Not Completed: Patient not medically ready.  Pt with neuro decline and increased size of Lt BG hemorrhage.  [name]., OTR/L Acute Rehabilitation Services Pager [phone] Office [phone]   [name] M 09/12/2020, 10:23 AM

## 2020-09-12 NOTE — Progress Notes (Signed)
Dr. Roda Shutters at bedside. SBP greater than 140 and maxed on 32 of cleviprex. Order for cortrak to be placed and then to start PO BP medications.

## 2020-09-12 NOTE — Procedures (Signed)
Cortrak  Person Inserting Tube:  Romero, Kathlynn Swofford E, RD Tube Type:  Cortrak - 43 inches Tube Location:  Left nare Initial Placement:  Stomach Secured by: Bridle Technique Used to Measure Tube Placement:  Documented cm marking at nare/ corner of mouth Cortrak Secured At:  70 cm    Cortrak Tube Team Note:  Consult received to place a Cortrak feeding tube.   No x-ray is required. RN may begin using tube.   If the tube becomes dislodged please keep the tube and contact the Cortrak team at [email or web address] ([email or web address]) for replacement.  If after hours and replacement cannot be delayed, place a NG tube and confirm placement with an abdominal x-ray.   Jason Clingenpeel King, MS, RD, LDN Pager number available on Amion 

## 2020-09-12 NOTE — Progress Notes (Signed)
PT Cancellation Note  Patient Details Name: KNOWLEDGE ESCANDON MRN: [id number] DOB: [birth year]   Cancelled Treatment:    Reason Eval/Treat Not Completed: Patient not medically ready. Pt with neuro decline and increased size of Left BG hemorrhage. PT will continue to follow and evaluate when medically ready.  Deland Pretty, DPT   Acute Rehabilitation Department Pager #: [phone]  Gaetana Michaelis 09/12/2020, 10:26 AM

## 2020-09-13 ENCOUNTER — Inpatient Hospital Stay (HOSPITAL_COMMUNITY): Payer: Medicaid Other

## 2020-09-13 DIAGNOSIS — I619 Nontraumatic intracerebral hemorrhage, unspecified: Secondary | ICD-10-CM

## 2020-09-13 LAB — CBC
HCT: 39.4 % (ref 39.0–52.0)
Hemoglobin: 12.9 g/dL — ABNORMAL LOW (ref 13.0–17.0)
MCH: 28.5 pg (ref 26.0–34.0)
MCHC: 32.7 g/dL (ref 30.0–36.0)
MCV: 87 fL (ref 80.0–100.0)
Platelets: 330 10*3/uL (ref 150–400)
RBC: 4.53 MIL/uL (ref 4.22–5.81)
RDW: 15.5 % (ref 11.5–15.5)
WBC: 13.8 10*3/uL — ABNORMAL HIGH (ref 4.0–10.5)
nRBC: 0 % (ref 0.0–0.2)

## 2020-09-13 LAB — BASIC METABOLIC PANEL
Anion gap: 11 (ref 5–15)
BUN: 14 mg/dL (ref 6–20)
CO2: 21 mmol/L — ABNORMAL LOW (ref 22–32)
Calcium: 8.8 mg/dL — ABNORMAL LOW (ref 8.9–10.3)
Chloride: 115 mmol/L — ABNORMAL HIGH (ref 98–111)
Creatinine, Ser: 0.82 mg/dL (ref 0.61–1.24)
GFR, Estimated: >60 mL/min (ref >60)
Glucose, Bld: 161 mg/dL — ABNORMAL HIGH (ref 70–99)
Potassium: 3.1 mmol/L — ABNORMAL LOW (ref 3.5–5.1)
Sodium: 147 mmol/L — ABNORMAL HIGH (ref 135–145)

## 2020-09-13 LAB — MAGNESIUM
Magnesium: 2 mg/dL (ref 1.7–2.4)
Magnesium: 1.9 mg/dL (ref 1.7–2.4)

## 2020-09-13 LAB — GLUCOSE, CAPILLARY
Glucose-Capillary: 115 mg/dL — ABNORMAL HIGH (ref 70–99)
Glucose-Capillary: 123 mg/dL — ABNORMAL HIGH (ref 70–99)
Glucose-Capillary: 128 mg/dL — ABNORMAL HIGH (ref 70–99)
Glucose-Capillary: 134 mg/dL — ABNORMAL HIGH (ref 70–99)
Glucose-Capillary: 162 mg/dL — ABNORMAL HIGH (ref 70–99)

## 2020-09-13 LAB — SODIUM
Sodium: 149 mmol/L — ABNORMAL HIGH (ref 135–145)
Sodium: 149 mmol/L — ABNORMAL HIGH (ref 135–145)
Sodium: 149 mmol/L — ABNORMAL HIGH (ref 135–145)

## 2020-09-13 LAB — PHOSPHORUS
Phosphorus: 1.7 mg/dL — ABNORMAL LOW (ref 2.5–4.6)
Phosphorus: 3.3 mg/dL (ref 2.5–4.6)

## 2020-09-13 MED ORDER — LABETALOL HCL 100 MG PO TABS
200.0000 mg | ORAL_TABLET | Freq: Three times a day (TID) | ORAL | Status: DC
  Administered 2020-09-13 – 2020-09-14 (×3): 200 mg
  Filled 2020-09-13 (×3): qty 2

## 2020-09-13 MED ORDER — POTASSIUM PHOSPHATES 15 MMOLE/5ML IV SOLN
30.0000 mmol | Freq: Once | INTRAVENOUS | Status: AC
  Administered 2020-09-13: 30 mmol via INTRAVENOUS
  Filled 2020-09-13: qty 10

## 2020-09-13 MED ORDER — POTASSIUM CHLORIDE 20 MEQ/15ML (10%) PO SOLN: 40.0000 meq | ORAL | Status: DC

## 2020-09-13 MED ORDER — POTASSIUM CHLORIDE 20 MEQ PO PACK
40.0000 meq | PACK | Freq: Once | ORAL | Status: AC
  Administered 2020-09-13: 40 meq
  Filled 2020-09-13: qty 2

## 2020-09-13 NOTE — Consult Note (Signed)
Physical Medicine and Rehabilitation Consult Reason for Consult: Right side weakness with facial droop Referring Physician: Dr.Xu   HPI: Jason Romero is a 49 y.o. right-handed male with history of COPD/tobacco abuse, hypertension, atrial fibrillation not on anticoagulation patient did not follow-up with cardiology services.  Per chart review patient lives with spouse independent prior to admission.  1 level home with level entry.  Presented 09/11/2020 with right side weakness and facial droop slumped over in chair..  Blood pressure 195/127.  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  No shift or hydrocephalus.  CTA of head and neck no hemodynamically significant stenosis.  Echocardiogram with ejection fraction of 50 to 55% grade 2 diastolic dysfunction.  No regional wall motion abnormalities.  Admission chemistries unremarkable except potassium 2.8, glucose 114, urine drug screen positive amphetamines and marijuana, urinalysis negative nitrite.  Neurology follow-up patient was initially placed on 3% saline.  ICH felt to be related to hypertensive crisis.  Placed on Cleviprex for blood pressure control.  Currently n.p.o. with alternative means of nutritional support.  Therapy evaluations completed with recommendations of physical medicine rehab consult.  Pt frustrated and upset- but kept repeating himself- hard ot determine his concerns at this time.   Review of Systems  Constitutional: Negative for chills and fever.  HENT: Negative for hearing loss.   Eyes: Negative for blurred vision and double vision.  Respiratory: Negative for cough and shortness of breath.   Cardiovascular: Positive for palpitations and leg swelling. Negative for chest pain.  Gastrointestinal: Positive for constipation. Negative for heartburn, nausea and vomiting.  Genitourinary: Negative for dysuria, flank pain and hematuria.  Musculoskeletal: Positive for myalgias.   Skin: Negative for rash.  Neurological: Positive for speech change and weakness.  All other systems reviewed and are negative.  Past Medical History:  Diagnosis Date  . A-fib (HCC)   . Chronic headaches   . COPD (chronic obstructive pulmonary disease) (HCC)   . GERD (gastroesophageal reflux disease)   . History of blood transfusion   . Hypertension   . MI (myocardial infarction) (HCC)   . Seizures (HCC)    Past Surgical History:  Procedure Laterality Date  . CHOLECYSTECTOMY     Family History  Problem Relation Age of Onset  . Alcoholism Other   . Arthritis Other   . Breast cancer Other   . Heart disease Mother    Social History:  reports that he has been smoking. He has been smoking about 1.00 pack per day. He has never used smokeless tobacco. He reports current alcohol use. He reports that he does not use drugs. Allergies:  Allergies  Allergen Reactions  . Hydrocodone Hives  . Ibuprofen Other (See Comments)    Pt can't take this medication because it interacts with other medications that he is taking.    Medications Prior to Admission  Medication Sig Dispense Refill  . ALPRAZolam (XANAX) 1 MG tablet Take 1 tablet (1 mg total) by mouth 3 (three) times daily. (Patient not taking: Reported on 09/11/2020) 90 tablet 2  . buPROPion (WELLBUTRIN SR) 150 MG 12 hr tablet Take 150 mg (one tablet) by mouth daily for 3 days, then take 150 mg (1 tablet) by mouth twice daily (Patient not taking: Reported on 09/11/2020) 60 tablet 3  . hydrALAZINE (APRESOLINE) 50 MG tablet Take 1 tablet (50 mg total) by mouth 4 (four) times daily. (Patient not taking: Reported on 09/11/2020) 120 tablet 3  .  hydrochlorothiazide (HYDRODIURIL) 25 MG tablet Take 1 tablet (25 mg total) by mouth daily. (Patient not taking: Reported on 09/11/2020) 90 tablet 3  . lisinopril (PRINIVIL,ZESTRIL) 40 MG tablet Take 1 tablet (40 mg total) by mouth daily. (Patient not taking: Reported on 09/11/2020) 90 tablet 0     Home: Home Living Family/patient expects to be discharged to:: Private residence Living Arrangements: Spouse/significant other, Other relatives Available Help at Discharge: Family, Available 24 hours/day Type of Home: House Home Access: Level entry Home Layout: One level Bathroom Shower/Tub: Engineer, manufacturing systems: Standard Home Equipment: None  Functional History: Prior Function Level of Independence: Independent Functional Status:  Mobility: Bed Mobility Overal bed mobility: Needs Assistance Bed Mobility: Rolling, Sit to Supine, Supine to Sit Rolling: Min assist, Max assist Supine to sit: Max assist, +2 for physical assistance, +2 for safety/equipment, HOB elevated Sit to supine: Max assist, +2 for physical assistance, +2 for safety/equipment, HOB elevated General bed mobility comments: min assist for roll to R for completion of trunk translation, pt with good LUE reaching towards opposite handrail when cued. Max assist for roll to L for all aspects. Max +2 supine<>sit for trunk and LE management, scooting to and from EOB, boost up in bed upon return to supine. Pt assisting with LUE/LE Transfers General transfer comment: unable this day      ADL:    Cognition: Cognition Overall Cognitive Status: Difficult to assess Orientation Level: Other (comment) Cognition Arousal/Alertness: Awake/alert Behavior During Therapy: Restless Overall Cognitive Status: Difficult to assess General Comments: Follows commands on L side, limited by incoordination. Answers yes/no questions appropriately, A&O x3 when given answer choices to pick from (are you in [employer]? Or are you in the hospital). Difficult to assess due to: Impaired communication (expressive aphasic)  Blood pressure (!) 184/87, pulse 85, temperature (!) 97.5 F (36.4 C), temperature source Oral, resp. rate (!) 27, height 6' 1.5" (1.867 m), weight 113.8 kg, SpO2 94 %. Physical Exam Vitals and nursing note  reviewed.  Constitutional:      Comments: Pt awake, gaze Left, but perseverating on trying to Get OOB while I was there- kept pulling at line, but didn't pull any out- frustrated, kept sighing, and did say was unhappy, on monitor, NAD  HENT:     Head: Atraumatic.     Comments: L R facial droop-at rest, wouldn't/couldn't smile or stick out tongue; had Cortrak in place; no O2 on    Right Ear: External ear normal.     Left Ear: External ear normal.     Nose: Nose normal. No congestion.     Mouth/Throat:     Mouth: Mucous membranes are dry.     Pharynx: Oropharynx is clear. No oropharyngeal exudate.  Eyes:     General:        Right eye: No discharge.        Left eye: No discharge.     Comments: Severe L gaze deviation- wasn't even able to track me to midline- unable to see if nystagmus seen due to pt ability to follow commands  Cardiovascular:     Rate and Rhythm: Normal rate. Rhythm irregular.     Heart sounds: Normal heart sounds. No murmur heard.   Pulmonary:     Comments: Somewhat coarse- mainly upper airway sounds- good air movement- decreased at bases B/L Abdominal:     Comments: Soft, NT, ND, (+)BS    Musculoskeletal:     Cervical back: Normal range of motion. No rigidity.  Comments: Moving L side spontaneously, and pulling at lines- didn't pull anything out- jerking movements- no movement seen on R even with painful stimuli  Skin:    Comments: L forearm IV- dried blood on it- L antecubital fossa IV- looks good   Neurological:     Comments: Patient is lethargic.  Left gaze preference.  Patient is aphasic.  Opens eyes to verbal stimuli but would fall back to sleep.  He did not follow commands.  Aphasic, however even though frustrated, did say "can't feel" when asked about sensation on R side- decreased vs absent to light touch  Psychiatric:     Comments: Frustrated/agitated, but fell asleep easily after I left     Results for orders placed or performed during the  hospital encounter of 09/11/20 (from the past 24 hour(s))  Sodium     Status: None   Collection Time: 09/12/20  3:04 PM  Result Value Ref Range   Sodium 144 135 - 145 mmol/L  Magnesium     Status: None   Collection Time: 09/12/20  6:50 PM  Result Value Ref Range   Magnesium 2.0 1.7 - 2.4 mg/dL  Phosphorus     Status: Abnormal   Collection Time: 09/12/20  6:50 PM  Result Value Ref Range   Phosphorus 1.7 (L) 2.5 - 4.6 mg/dL  Sodium     Status: None   Collection Time: 09/12/20  8:39 PM  Result Value Ref Range   Sodium 145 135 - 145 mmol/L  CBC     Status: Abnormal   Collection Time: 09/13/20  4:47 AM  Result Value Ref Range   WBC 13.8 (H) 4.0 - 10.5 K/uL   RBC 4.53 4.22 - 5.81 MIL/uL   Hemoglobin 12.9 (L) 13.0 - 17.0 g/dL   HCT 63.1 39 - 52 %   MCV 87.0 80.0 - 100.0 fL   MCH 28.5 26.0 - 34.0 pg   MCHC 32.7 30.0 - 36.0 g/dL   RDW 49.7 02.6 - 37.8 %   Platelets 330 150 - 400 K/uL   nRBC 0.0 0.0 - 0.2 %  Basic metabolic panel     Status: Abnormal   Collection Time: 09/13/20  4:47 AM  Result Value Ref Range   Sodium 147 (H) 135 - 145 mmol/L   Potassium 3.1 (L) 3.5 - 5.1 mmol/L   Chloride 115 (H) 98 - 111 mmol/L   CO2 21 (L) 22 - 32 mmol/L   Glucose, Bld 161 (H) 70 - 99 mg/dL   BUN 14 6 - 20 mg/dL   Creatinine, Ser 5.88 0.61 - 1.24 mg/dL   Calcium 8.8 (L) 8.9 - 10.3 mg/dL   GFR, Estimated >50 >27 mL/min   Anion gap 11 5 - 15  Magnesium     Status: None   Collection Time: 09/13/20  4:47 AM  Result Value Ref Range   Magnesium 2.0 1.7 - 2.4 mg/dL  Phosphorus     Status: Abnormal   Collection Time: 09/13/20  4:47 AM  Result Value Ref Range   Phosphorus 1.7 (L) 2.5 - 4.6 mg/dL  Glucose, capillary     Status: Abnormal   Collection Time: 09/13/20  8:20 AM  Result Value Ref Range   Glucose-Capillary 162 (H) 70 - 99 mg/dL  Sodium     Status: Abnormal   Collection Time: 09/13/20  9:19 AM  Result Value Ref Range   Sodium 149 (H) 135 - 145 mmol/L  Glucose, capillary     Status:  Abnormal  Collection Time: 09/13/20 11:55 AM  Result Value Ref Range   Glucose-Capillary 115 (H) 70 - 99 mg/dL   CT HEAD WO CONTRAST  Result Date: 09/13/2020 CLINICAL DATA:  Follow-up parenchymal hemorrhage EXAM: CT HEAD WITHOUT CONTRAST TECHNIQUE: Contiguous axial images were obtained from the base of the skull through the vertex without intravenous contrast. COMPARISON:  Head CT from 3 days ago FINDINGS: Brain: Parenchymal hemorrhage centered at the left external capsule and adjacent white matter tracks, 5.5 x 2.6 cm on axial slices, non progressed. The rim of edema is more pronounced but no progressive mass effect. Midline shift is trace. No visible cortex infarct. Trace clot is again seen at the left occipital horn, with no ventriculomegaly. Vascular: Negative Skull: Normal. Negative for fracture or focal lesion. Sinuses/Orbits: Negative IMPRESSION: Nonprogressive left cerebral hematoma.No hydrocephalus. Electronically Signed   By: Marnee Spring M.D.   On: 09/13/2020 06:52   CT HEAD WO CONTRAST  Result Date: 09/11/2020 CLINICAL DATA:  Parenchymal hemorrhage, follow-up. EXAM: CT HEAD WITHOUT CONTRAST TECHNIQUE: Contiguous axial images were obtained from the base of the skull through the vertex without intravenous contrast. COMPARISON:  09/11/2020 CTA head and neck. 09/11/2020 noncontrast head CT and prior. FINDINGS: Brain: Left cerebral hemorrhage involving the basal ganglia is slightly increased in size measuring 6.1 x 2.5 x 4.7 cm (3:18, 5:39, previously 5.6 x 2.5 x 4.5 cm). Partial effacement of the left lateral ventricle. Minimal 3 mm rightward midline shift. No new focus of intracranial hemorrhage. Minimal intraventricular extension involving the left occipital horn (3:15), unchanged. Patent basilar cisterns. No new focal hypodensity. Vascular: No hyperdense vessel or unexpected calcification. Bilateral carotid siphon atherosclerotic calcifications. Skull: No acute finding. Sinuses/Orbits:  Normal orbits. Paranasal sinuses and mastoid air cells are pneumatized. Other: None. IMPRESSION: Slightly increased size of left cerebral hemorrhage measuring 6.1 cm, previously 5.6 cm. Minimal intraventricular extension involving the left occipital horn, unchanged. Rightward midline shift of 3 mm and partial left lateral ventricle effacement, unchanged. These results will be called to the ordering clinician or representative by the Radiologist Assistant, and communication documented in the PACS or [employer]. Electronically Signed   By: Stana Bunting M.D.   On: 09/11/2020 20:29   DG CHEST PORT 1 VIEW  Result Date: 09/12/2020 CLINICAL DATA:  49 year old male with history of leukocytosis. EXAM: PORTABLE CHEST 1 VIEW COMPARISON:  Chest x-ray 02/02/2016. FINDINGS: Feeding tube extending into the stomach, with tip below the lower margin of the image. Lung volumes are normal. No consolidative airspace disease. No pleural effusions. No evidence of pulmonary edema. Mild cardiomegaly. No pneumothorax. Upper mediastinal contours are within normal limits. IMPRESSION: 1. No radiographic evidence of acute cardiopulmonary disease. 2. Cardiomegaly. Electronically Signed   By: Trudie Reed M.D.   On: 09/12/2020 15:34   ECHOCARDIOGRAM COMPLETE  Result Date: 09/11/2020    ECHOCARDIOGRAM REPORT   Patient Name:   Jason Romero Date of Exam: 09/11/2020 Medical Rec #:  [id number]      Height:       73.5 in Accession #:    [id number]     Weight:       250.9 lb Date of Birth:  05-13-1971     BSA:          2.381 m Patient Age:    48 years       BP:           122/77 mmHg Patient Gender: M  HR:           79 bpm. Exam Location:  Inpatient Procedure: 2D Echo, Cardiac Doppler and Color Doppler Indications:    Stroke 434.9/I163.9  History:        Patient has no prior history of Echocardiogram examinations.  Sonographer:    Roosvelt Maser RDCS Referring Phys: [id number] JINDONG XU IMPRESSIONS  1. Left ventricular  ejection fraction, by estimation, is 50 to 55%. The left ventricle has low normal function. The left ventricle has no regional wall motion abnormalities. There is mild left ventricular hypertrophy. Left ventricular diastolic parameters are consistent with Grade II diastolic dysfunction (pseudonormalization).  2. Right ventricular systolic function is normal. The right ventricular size is normal.  3. Left atrial size was mildly dilated.  4. The mitral valve is normal in structure. Mild to moderate mitral valve regurgitation. No evidence of mitral stenosis.  5. The aortic valve is normal in structure. Aortic valve regurgitation is not visualized. No aortic stenosis is present.  6. The inferior vena cava is normal in size with greater than 50% respiratory variability, suggesting right atrial pressure of 3 mmHg. Conclusion(s)/Recommendation(s): No intracardiac source of embolism detected on this transthoracic study. A transesophageal echocardiogram is recommended to exclude cardiac source of embolism if clinically indicated. FINDINGS  Left Ventricle: Left ventricular ejection fraction, by estimation, is 50 to 55%. The left ventricle has low normal function. The left ventricle has no regional wall motion abnormalities. The left ventricular internal cavity size was normal in size. There is mild left ventricular hypertrophy. Left ventricular diastolic parameters are consistent with Grade II diastolic dysfunction (pseudonormalization). Right Ventricle: The right ventricular size is normal. No increase in right ventricular wall thickness. Right ventricular systolic function is normal. Left Atrium: Left atrial size was mildly dilated. Right Atrium: Right atrial size was normal in size. Pericardium: There is no evidence of pericardial effusion. Mitral Valve: The mitral valve is normal in structure. Mild to moderate mitral valve regurgitation. No evidence of mitral valve stenosis. Tricuspid Valve: The tricuspid valve is normal  in structure. Tricuspid valve regurgitation is mild . No evidence of tricuspid stenosis. Aortic Valve: The aortic valve is normal in structure. Aortic valve regurgitation is not visualized. No aortic stenosis is present. Pulmonic Valve: The pulmonic valve was normal in structure. Pulmonic valve regurgitation is not visualized. No evidence of pulmonic stenosis. Aorta: The aortic root is normal in size and structure. Venous: The inferior vena cava is normal in size with greater than 50% respiratory variability, suggesting right atrial pressure of 3 mmHg. IAS/Shunts: No atrial level shunt detected by color flow Doppler.  LEFT VENTRICLE PLAX 2D LVIDd:         5.80 cm Diastology LVIDs:         4.30 cm LV e' lateral: 10.10 cm/s LV PW:         1.20 cm LV IVS:        1.10 cm  RIGHT VENTRICLE          IVC RV Basal diam:  3.70 cm  IVC diam: 2.30 cm LEFT ATRIUM             Index       RIGHT ATRIUM           Index LA diam:        5.00 cm 2.10 cm/m  RA Area:     25.30 cm LA Vol (A2C):   85.9 ml 36.07 ml/m RA Volume:   77.80 ml  32.67  ml/m LA Vol (A4C):   82.4 ml 34.60 ml/m LA Biplane Vol: 88.2 ml 37.04 ml/m  AORTIC VALVE LVOT Vmax:   92.50 cm/s LVOT Vmean:  57.100 cm/s LVOT VTI:    0.166 m  AORTA Ao Root diam: 4.00 cm Ao Asc diam:  3.60 cm  SHUNTS Systemic VTI: 0.17 m Donato Schultz MD Electronically signed by Donato Schultz MD Signature Date/Time: 09/11/2020/3:11:57 PM    Final      Assessment/Plan: 1. 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 Does the need for close, 24 hr/day medical supervision in concert with the patient's rehab needs make it unreasonable for this patient to be served in a less intensive setting? Yes 3. Co-Morbidities requiring supervision/potential complications: substance abuse, uncontrolled HTN, Afib- wasn't on blood thinners, 2ppd 4. Due to bladder management, bowel management, safety, skin/wound care, disease management, medication  administration, pain management and patient education, does the patient require 24 hr/day rehab nursing? Potentially 5. Does the patient require coordinated care of a physician, rehab nurse, therapy disciplines of PT, OT and SLP to address physical and functional deficits in the context of the above medical diagnosis(es)? Yes Addressing deficits in the following areas: balance, endurance, locomotion, strength, transferring, bowel/bladder control, bathing, dressing, feeding, grooming, toileting, cognition, speech, language, swallowing and psychosocial support 6. Can the patient actively participate in an intensive therapy program of at least 3 hrs of therapy per day at least 5 days per week? Potentially 7. The potential for patient to make measurable gains while on inpatient rehab is good and fair 8. Anticipated functional outcomes upon discharge from inpatient rehab are min assist  with PT, min assist with OT, min assist and mod assist with SLP. 9. Estimated rehab length of stay to reach the above functional goals is: 3-4 weeks 10. Anticipated discharge destination: Home 11. Overall Rehab/Functional Prognosis: good and fair  RECOMMENDATIONS: This patient's condition is appropriate for continued rehabilitative care in the following setting: CIR Patient has agreed to participate in recommended program. Potentially Note that insurance prior authorization may be required for reimbursement for recommended care.  Comment: 1. Pt might benefit from Amantadine once come off Cleviprex 100 mg daily 2.  Set up room to push him to look more right 3. Will continue to monitor and submit for admissions coordinators to see/hopefully admit.  4. Thank you for this consult.      Mcarthur Rossetti Angiulli, PA-C 09/13/2020    I have personally performed a face to face diagnostic evaluation of this patient and formulated the key components of the plan.  Additionally, I have personally reviewed laboratory data, imaging  studies, as well as relevant notes and concur with the physician assistant's documentation above.

## 2020-09-13 NOTE — Progress Notes (Signed)
BP above prescribed goal, cleviprex gtt increased to max dosage.

## 2020-09-13 NOTE — Evaluation (Signed)
Clinical/Bedside Swallow Evaluation Patient Details  Name: Jason Romero MRN: [id number] Date of Birth: [birth year]  Today's Date: 09/13/2020 Time: SLP Start Time (ACUTE ONLY): 0935 SLP Stop Time (ACUTE ONLY): 0950 SLP Time Calculation (min) (ACUTE ONLY): 15 min  Past Medical History:  Past Medical History:  Diagnosis Date  . A-fib (HCC)   . Chronic headaches   . COPD (chronic obstructive pulmonary disease) (HCC)   . GERD (gastroesophageal reflux disease)   . History of blood transfusion   . Hypertension   . MI (myocardial infarction) (HCC)   . Seizures (HCC)    Past Surgical History:  Past Surgical History:  Procedure Laterality Date  . CHOLECYSTECTOMY     HPI:  The pt is a 49 yo male presenting with R-sided weakness and found to have intraparenchymal hematoma in the left basal ganglia on original CT. Repeat imaging revealed increased hemorrhage with 27mm midline shift. PMH includes: GERD, seizures, HTN, COPD, MI, afib, and current tobacco use.    Assessment / Plan / Recommendation Clinical Impression  Pt presents with signs of dysphagia with concern for decreased airway protection in the setting of R-sided motor and sensory impairment as well as marked dysarthria and impaired cognition. He has appropriate bolus acceptance and initiates mastication of ice chips promptly, but there is immediate coughing that follows. Would continue use of cortrak, keeping pt NPO for now. Anticipate that MBS will be needed but will f/u for readiness.   SLP Visit Diagnosis: Dysphagia, unspecified (R13.10)    Aspiration Risk  Severe aspiration risk    Diet Recommendation NPO;Alternative means - temporary   Medication Administration: Via alternative means    Other  Recommendations Oral Care Recommendations: Oral care QID Other Recommendations: Have oral suction available   Follow up Recommendations Inpatient Rehab      Frequency and Duration min 2x/week  2 weeks       Prognosis  Prognosis for Safe Diet Advancement: Good Barriers to Reach Goals: Cognitive deficits      Swallow Study   General HPI: The pt is a 49 yo male presenting with R-sided weakness and found to have intraparenchymal hematoma in the left basal ganglia on original CT. Repeat imaging revealed increased hemorrhage with 17mm midline shift. PMH includes: GERD, seizures, HTN, COPD, MI, afib, and current tobacco use.  Type of Study: Bedside Swallow Evaluation Previous Swallow Assessment: none in chart Diet Prior to this Study: NPO;NG Tube Temperature Spikes Noted: No Respiratory Status: Room air History of Recent Intubation: No Behavior/Cognition: Alert;Requires cueing Oral Cavity Assessment: Dried secretions Oral Care Completed by SLP: Yes Oral Cavity - Dentition: Adequate natural dentition;Poor condition Vision: Impaired for self-feeding Self-Feeding Abilities: Needs assist Patient Positioning: Upright in bed Baseline Vocal Quality: Normal Volitional Swallow: Unable to elicit    Oral/Motor/Sensory Function Overall Oral Motor/Sensory Function: Moderate impairment Facial ROM: Reduced right;Suspected CN VII (facial) dysfunction Facial Symmetry: Abnormal symmetry right;Suspected CN VII (facial) dysfunction Facial Strength: Reduced right;Suspected CN VII (facial) dysfunction   Ice Chips Ice chips: Impaired Presentation: Spoon Pharyngeal Phase Impairments: Cough - Immediate   Thin Liquid Thin Liquid: Not tested    Nectar Thick Nectar Thick Liquid: Not tested   Honey Thick Honey Thick Liquid: Not tested   Puree Puree: Not tested   Solid     Solid: Not tested      Mahala Menghini., M.A. CCC-SLP Acute Rehabilitation Services Pager [phone] Office [phone]  09/13/2020,1:36 PM

## 2020-09-13 NOTE — Evaluation (Signed)
Speech Language Pathology Evaluation Patient Details Name: Jason Romero MRN: [id number] DOB: Jan 06, 1971 Today's Date: 09/13/2020 Time: 0623-7628 SLP Time Calculation (min) (ACUTE ONLY): 24 min  Problem List:  Patient Active Problem List   Diagnosis Date Noted  . Stroke, hemorrhagic (HCC) 09/11/2020  . Sleep apnea 09/06/2016  . Tobacco abuse 09/06/2016  . 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 Lightheadedness 03/22/2016  . Unintentional weight loss 03/22/2016  . Essential hypertension 02/12/2016  . Anxiety 02/12/2016  . Migraines 02/12/2016  . Syncope 02/12/2016   Past Medical History:  Past Medical History:  Diagnosis Date  . A-fib (HCC)   . Chronic headaches   . COPD (chronic obstructive pulmonary disease) (HCC)   . GERD (gastroesophageal reflux disease)   . History of blood transfusion   . Hypertension   . MI (myocardial infarction) (HCC)   . Seizures (HCC)    Past Surgical History:  Past Surgical History:  Procedure Laterality Date  . CHOLECYSTECTOMY     HPI:  The pt is a 49 yo male presenting with R-sided weakness and found to have intraparenchymal hematoma in the left basal ganglia on original CT. Repeat imaging revealed increased hemorrhage with 17mm midline shift. PMH includes: GERD, seizures, HTN, COPD, MI, afib, and current tobacco use.    Assessment / Plan / Recommendation Clinical Impression  Pt presents with a significant dysarthria that impacts intelligibility at the word level. He seems to have some awareness of his changes in speech as he becomes frustrated, and sometimes will say "no" instead of responding to the command asked of him. He is also inaccurate with his yes/no responses though, demonstrating >75% accuracy with simple questions but <50% accuracy with more abstract concepts. He has a gaze preference to his L, focusing his attention and moving his eyes to the R only with motivating stimulus (picture of his cat). He followed a few one-step commands  throughout the evaluation. PTA pt was independent. He will benefit from intensive SLP f/u to maximize functional communication and safety.     SLP Assessment  SLP Recommendation/Assessment: Patient needs continued Speech Lanaguage Pathology Services SLP Visit Diagnosis: Cognitive communication deficit (R41.841);Dysarthria and anarthria (R47.1)    Follow Up Recommendations  Inpatient Rehab    Frequency and Duration min 2x/week  2 weeks      SLP Evaluation Cognition  Overall Cognitive Status: Impaired/Different from baseline Arousal/Alertness: Awake/alert Orientation Level: Oriented to person;Disoriented to place;Disoriented to situation Attention: Sustained Sustained Attention: Impaired Sustained Attention Impairment: Verbal basic;Functional basic Awareness: Impaired Awareness Impairment: Intellectual impairment Problem Solving: Impaired Problem Solving Impairment: Verbal basic       Comprehension  Auditory Comprehension Overall Auditory Comprehension: Impaired Yes/No Questions: Impaired Basic Biographical Questions: 76-100% accurate Basic Immediate Environment Questions: 50-74% accurate Complex Questions: 25-49% accurate Commands: Impaired One Step Basic Commands: 0-24% accurate    Expression Expression Primary Mode of Expression: Verbal Verbal Expression Overall Verbal Expression: Impaired (also very difficult to understand - needs more assessment) Written Expression Dominant Hand: Left   Oral / Motor  Oral Motor/Sensory Function Overall Oral Motor/Sensory Function: Moderate impairment Facial ROM: Reduced right;Suspected CN VII (facial) dysfunction Facial Symmetry: Abnormal symmetry right;Suspected CN VII (facial) dysfunction Facial Strength: Reduced right;Suspected CN VII (facial) dysfunction Motor Speech Overall Motor Speech: Impaired Respiration: Within functional limits Phonation: Normal Resonance: Within functional limits Articulation: Impaired Level of  Impairment: Word   GO                    Mahala Menghini., M.A.  CCC-SLP Acute Rehabilitation Services Pager [phone] Office [phone]  09/13/2020, 1:48 PM

## 2020-09-13 NOTE — Evaluation (Signed)
Occupational Therapy Evaluation Patient Details Name: Jason Romero MRN: [id number] DOB: December 24, 1970 Today's Date: 09/13/2020    History of Present Illness 48 yo male admitted to [facility] on 10/31 after being found by wife slumped in bathroom with R deficits. CT head reveals intraparenchymal hematoma in L BG, repeat head CT shows increased cerebral hemorrhage with R midline shift 3 mm. PMH includes afib, COPD, GERD, HTN, MI.   Clinical Impression   This 49 y/o male presents with the above. Pt with aphasia this session and communicating mostly via head nods, indicating PTA he was independent with ADL and mobility tasks. Pt currently presenting with the above and below listed deficits including R side weakness, impaired sitting balance, cognition and vision, poor coordination. Pt requiring two person assist for safe completion of bed mobility, requiring fluctuating levels of assist for static balance EOB (min-maxA) and requiring up to totalA for ADL at this time. Pt to benefit from continued acute OT services and currently recommend CIR level therapies at time of discharge to maximize his overall safety and independence with ADL and mobility.   BP resting: 153/78(100) BP sitting 0 min: 146/97(112) BP sitting 3 min: 143/88(106)    Follow Up Recommendations  CIR    Equipment Recommendations  Other (comment) (TBD)    Recommendations for Other Services Rehab consult     Precautions / Restrictions Precautions Precautions: Fall Precaution Comments: R hemiplegia, R inattention Restrictions Weight Bearing Restrictions: No      Mobility Bed Mobility Overal bed mobility: Needs Assistance Bed Mobility: Rolling;Sit to Supine;Supine to Sit Rolling: Min assist;Max assist   Supine to sit: Max assist;+2 for physical assistance;+2 for safety/equipment;HOB elevated Sit to supine: Max assist;+2 for physical assistance;+2 for safety/equipment;HOB elevated   General bed mobility comments: min assist  for roll to R for completion of trunk translation, pt with good LUE reaching towards opposite handrail when cued. Max assist for roll to L for all aspects. Max +2 supine<>sit for trunk and LE management, scooting to and from EOB, boost up in bed upon return to supine. Pt assisting with LUE/LE    Transfers                 General transfer comment: unable this day    Balance Overall balance assessment: Needs assistance Sitting-balance support: Single extremity supported;Feet supported Sitting balance-Leahy Scale: Poor Sitting balance - Comments: Evolving min-max assist to maintain upright static sitting, requires less assist with LUE supported on footboard of bed. Poor recognition of midline, benefits from truncal and pelvic input to right posture to center from R lateral lean Postural control: Posterior lean;Right lateral lean     Standing balance comment: NT                           ADL either performed or assessed with clinical judgement   ADL Overall ADL's : Needs assistance/impaired Eating/Feeding: NPO   Grooming: Maximal assistance;Sitting                               Functional mobility during ADLs: Maximal assistance;+2 for physical assistance;+2 for safety/equipment (bed mobility) General ADL Comments: pt requiring max-totalA for ADL given R side weakness, impaired cognition and poor coordination with LUE     Vision Baseline Vision/History: No visual deficits Patient Visual Report: Other (comment) (indicates a change, but unable to specify) Vision Assessment?: Vision impaired- to be further tested  in functional context Additional Comments: R inattention/neglect noted - will continue to assess      Perception Perception Perception Tested?: Yes Perception Deficits: Inattention/neglect Inattention/Neglect: Does not attend to right visual field;Impaired- to be further tested in functional context   Praxis      Pertinent Vitals/Pain Pain  Assessment: Faces Faces Pain Scale: Hurts little more Pain Location: Leg (unable to specify which) Pain Descriptors / Indicators: Discomfort;Grimacing;Restless Pain Intervention(s): Limited activity within patient's tolerance;Monitored during session;Repositioned     Hand Dominance Left   Extremity/Trunk Assessment Upper Extremity Assessment Upper Extremity Assessment: RUE deficits/detail RUE Deficits / Details: flaccid RUE, edemous RUE Sensation: decreased light touch;decreased proprioception RUE Coordination: decreased fine motor;decreased gross motor   Lower Extremity Assessment Lower Extremity Assessment: Defer to PT evaluation RLE Deficits / Details: 0/5 throughout extremity RLE Sensation: decreased light touch   Cervical / Trunk Assessment Cervical / Trunk Assessment: Normal   Communication Communication Communication: Expressive difficulties   Cognition Arousal/Alertness: Awake/alert Behavior During Therapy: Restless Overall Cognitive Status: Impaired/Different from baseline                                 General Comments: Follows commands on L side, limited by incoordination. Answers yes/no questions appropriately, A&O x3 when given answer choices to pick from (are you in [employer]? Or are you in the hospital).   General Comments  L gaze preference, able to turn head and visually track to R with max cuing. LUE and LE incoordination as assessed by finger to nose and heel to shin, although difficult to determine if this was due to difficulty with command following.    Exercises Other Exercises Other Exercises: RUE propped on pillows for proper alignment and edema management   Shoulder Instructions      Home Living Family/patient expects to be discharged to:: Private residence Living Arrangements: Spouse/significant other;Other relatives Available Help at Discharge: Family;Available 24 hours/day Type of Home: House Home Access: Level entry      Home Layout: One level     Bathroom Shower/Tub: Chief Strategy Officer: Standard     Home Equipment: None      Lives With: Spouse    Prior Functioning/Environment Level of Independence: Independent        Comments: communicating via head nods, reports he doesn't drive or work; does some basic ADL around the home         OT Problem List: Decreased strength;Decreased range of motion;Decreased activity tolerance;Impaired balance (sitting and/or standing);Decreased knowledge of use of DME or AE;Impaired UE functional use;Impaired tone;Impaired sensation;Increased edema;Decreased cognition;Decreased coordination;Impaired vision/perception;Decreased safety awareness      OT Treatment/Interventions: Self-care/ADL training;Therapeutic exercise;Neuromuscular education;Energy conservation;DME and/or AE instruction;Therapeutic activities;Cognitive remediation/compensation;Patient/family education;Balance training;Visual/perceptual remediation/compensation    OT Goals(Current goals can be found in the care plan section) Acute Rehab OT Goals Patient Stated Goal: none explicitly stated OT Goal Formulation: With patient Time For Goal Achievement: 09/27/20 Potential to Achieve Goals: Good  OT Frequency: Min 2X/week   Barriers to D/C:            Co-evaluation PT/OT/SLP Co-Evaluation/Treatment: Yes Reason for Co-Treatment: Complexity of the patient's impairments (multi-system involvement);To address functional/ADL transfers;For patient/therapist safety PT goals addressed during session: Mobility/safety with mobility;Balance OT goals addressed during session: ADL's and self-care      AM-PAC OT "6 Clicks" Daily Activity     Outcome Measure Help from another person eating meals?: Total Help from  another person taking care of personal grooming?: A Lot Help from another person toileting, which includes using toliet, bedpan, or urinal?: Total Help from another person bathing  (including washing, rinsing, drying)?: A Lot Help from another person to put on and taking off regular upper body clothing?: A Lot Help from another person to put on and taking off regular lower body clothing?: Total 6 Click Score: 9   End of Session Nurse Communication: Mobility status  Activity Tolerance: Patient tolerated treatment well Patient left: in bed;with call bell/phone within reach;with nursing/sitter in room  OT Visit Diagnosis: Other abnormalities of gait and mobility (R26.89);Hemiplegia and hemiparesis;Cognitive communication deficit (R41.841) Symptoms and signs involving cognitive functions: Nontraumatic intracerebral hemorrhage Hemiplegia - Right/Left: Right Hemiplegia - dominant/non-dominant: Non-Dominant Hemiplegia - caused by: Nontraumatic intracerebral hemorrhage                Time: 4585-9292 OT Time Calculation (min): 26 min Charges:  OT General Charges $OT Visit: 1 Visit OT Evaluation $OT Eval Moderate Complexity: 1 Mod  Marcy Siren, OT Acute Rehabilitation Services Pager [phone] Office [phone]   Orlando Penner 09/13/2020, 4:29 PM

## 2020-09-13 NOTE — Evaluation (Signed)
Physical Therapy Evaluation Patient Details Name: Jason Romero MRN: [id number] DOB: February 05, 1971 Today's Date: 09/13/2020   History of Present Illness  49 yo male admitted to [facility] on 10/31 after being found by wife slumped in bathroom with R deficits. CT head reveals intraparenchymal hematoma in L BG, repeat head CT shows increased cerebral hemorrhage with R midline shift 3 mm. PMH includes afib, COPD, GERD, HTN, MI.  Clinical Impression   Pt presents with R hemiplegia, L gaze preference, impaired R sided sensation, impaired sitting balance, aphasia, incoordination, and decreased activity tolerance vs baseline. Pt to benefit from acute PT to address deficits. Pt required max +2 assist for bed mobility and EOB sitting today, unable to progress OOB given pt fatigue and reported dizziness. At baseline, pt was completely independent per report, recommending CIR to maximize functional recovery. PT to progress mobility as tolerated, and will continue to follow acutely.   BP resting: 153/78(100) BP sitting 0 min: 146/97(112) BP sitting 3 min: 143/88(106)    Follow Up Recommendations CIR;Supervision/Assistance - 24 hour    Equipment Recommendations  Other (comment) (TBD)    Recommendations for Other Services       Precautions / Restrictions Precautions Precautions: Fall Precaution Comments: R hemiplegia, R inattention Restrictions Weight Bearing Restrictions: No      Mobility  Bed Mobility Overal bed mobility: Needs Assistance Bed Mobility: Rolling;Sit to Supine;Supine to Sit Rolling: Min assist;Max assist   Supine to sit: Max assist;+2 for physical assistance;+2 for safety/equipment;HOB elevated Sit to supine: Max assist;+2 for physical assistance;+2 for safety/equipment;HOB elevated   General bed mobility comments: min assist for roll to R for completion of trunk translation, pt with good LUE reaching towards opposite handrail when cued. Max assist for roll to L for all aspects.  Max +2 supine<>sit for trunk and LE management, scooting to and from EOB, boost up in bed upon return to supine. Pt assisting with LUE/LE    Transfers                 General transfer comment: unable this day  Ambulation/Gait                Stairs            Wheelchair Mobility    Modified Rankin (Stroke Patients Only) Modified Rankin (Stroke Patients Only) Pre-Morbid Rankin Score: No symptoms Modified Rankin: Severe disability     Balance Overall balance assessment: Needs assistance Sitting-balance support: Single extremity supported;Feet supported Sitting balance-Leahy Scale: Poor Sitting balance - Comments: Evolving min-max assist to maintain upright static sitting, requires less assist with LUE supported on footboard of bed. Poor recognition of midline, benefits from truncal and pelvic input to right posture to center from R lateral lean Postural control: Posterior lean;Right lateral lean     Standing balance comment: NT                             Pertinent Vitals/Pain Pain Assessment: Faces Faces Pain Scale: Hurts little more Pain Location: Leg (unable to specify which) Pain Descriptors / Indicators: Discomfort;Grimacing;Restless Pain Intervention(s): Limited activity within patient's tolerance;Monitored during session;Repositioned    Home Living Family/patient expects to be discharged to:: Private residence Living Arrangements: Spouse/significant other;Other relatives Available Help at Discharge: Family;Available 24 hours/day Type of Home: House Home Access: Level entry     Home Layout: One level Home Equipment: None      Prior Function Level of Independence: Independent  Hand Dominance   Dominant Hand: Left    Extremity/Trunk Assessment   Upper Extremity Assessment Upper Extremity Assessment: RUE deficits/detail RUE Deficits / Details: 0/5 throughout extremity RUE Sensation: decreased light touch     Lower Extremity Assessment Lower Extremity Assessment: RLE deficits/detail RLE Deficits / Details: 0/5 throughout extremity RLE Sensation: decreased light touch    Cervical / Trunk Assessment Cervical / Trunk Assessment: Normal  Communication   Communication: No difficulties  Cognition Arousal/Alertness: Awake/alert Behavior During Therapy: Restless Overall Cognitive Status: Difficult to assess                                 General Comments: Follows commands on L side, limited by incoordination. Answers yes/no questions appropriately, A&O x3 when given answer choices to pick from (are you in [employer]? Or are you in the hospital).      General Comments General comments (skin integrity, edema, etc.): L gaze preference, able to turn head and visually track to R with max cuing. LUE and LE incoordination as assessed by finger to nose and heel to shin, although difficult to determine if this was due to difficulty with command following.    Exercises Other Exercises Other Exercises: RUE propped on pillows for proper alignment and edema management   Assessment/Plan    PT Assessment Patient needs continued PT services  PT Problem List Decreased strength;Decreased mobility;Decreased safety awareness;Impaired tone;Decreased activity tolerance;Decreased balance;Decreased knowledge of use of DME;Pain;Impaired sensation;Decreased cognition;Cardiopulmonary status limiting activity;Decreased coordination       PT Treatment Interventions Therapeutic activities;DME instruction;Therapeutic exercise;Gait training;Patient/family education;Balance training;Functional mobility training;Neuromuscular re-education    PT Goals (Current goals can be found in the Care Plan section)  Acute Rehab PT Goals Patient Stated Goal: none explicitly stated PT Goal Formulation: With patient Time For Goal Achievement: 09/27/20 Potential to Achieve Goals: Good    Frequency Min 4X/week    Barriers to discharge        Co-evaluation PT/OT/SLP Co-Evaluation/Treatment: Yes Reason for Co-Treatment: Complexity of the patient's impairments (multi-system involvement);For patient/therapist safety;To address functional/ADL transfers PT goals addressed during session: Mobility/safety with mobility;Balance         AM-PAC PT "6 Clicks" Mobility  Outcome Measure Help needed turning from your back to your side while in a flat bed without using bedrails?: A Lot Help needed moving from lying on your back to sitting on the side of a flat bed without using bedrails?: Total Help needed moving to and from a bed to a chair (including a wheelchair)?: Total Help needed standing up from a chair using your arms (e.g., wheelchair or bedside chair)?: Total Help needed to walk in hospital room?: Total Help needed climbing 3-5 steps with a railing? : Total 6 Click Score: 7    End of Session   Activity Tolerance: Patient limited by fatigue Patient left: in bed;with call bell/phone within reach;with bed alarm set;with nursing/sitter in room;with SCD's reapplied Nurse Communication: Mobility status PT Visit Diagnosis: Hemiplegia and hemiparesis;Muscle weakness (generalized) (M62.81) Hemiplegia - Right/Left: Right Hemiplegia - dominant/non-dominant: Non-dominant Hemiplegia - caused by: Nontraumatic intracerebral hemorrhage    Time: 1026-1055 PT Time Calculation (min) (ACUTE ONLY): 29 min   Charges:   PT Evaluation $PT Eval Moderate Complexity: 1 Mod         Daijah Scrivens E, PT Acute Rehabilitation Services Pager [phone]  Office [phone]   Hamsa Laurich D Despina Hidden 09/13/2020, 12:52 PM

## 2020-09-13 NOTE — Progress Notes (Signed)
Inpatient Rehab Admissions Coordinator Note:   Per therapy recommendations, pt was screened for CIR candidacy by Estill Dooms, PT, DPT.  At this time we are recommending a CIR consult and I will place an order per our protocol.  Please contact me with questions.   Estill Dooms, PT, DPT [phone] 09/13/20 1:03 PM

## 2020-09-13 NOTE — Progress Notes (Signed)
STROKE TEAM PROGRESS NOTE   INTERVAL HISTORY RN, wife and speech therapist at bedside.  Patient eyes open spontaneously, seems more awake alert than yesterday.  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  CT repeat showed stable hematoma and midline shift.  Still on 3% saline, sodium 147.  OBJECTIVE Vitals:   09/13/20 0854 09/13/20 0900 09/13/20 1000 09/13/20 1015  BP:  (!) 137/94 (!) 136/94 (!) 184/87  Pulse:  84 94 85  Resp:  19 19 (!) 27  Temp: 98.8 F (37.1 C)     TempSrc: Oral     SpO2:  98% 93% 94%  Weight:      Height:       CBC:  Recent Labs  Lab 09/11/20 0507 09/11/20 0507 09/12/20 0513 09/13/20 0447  WBC 10.6*   < > 14.4* 13.8*  NEUTROABS 6.6  --   --   --   HGB 13.5   < > 12.9* 12.9*  HCT 41.6   < > 39.5 39.4  MCV 86.8   < > 86.6 87.0  PLT 346   < > 353 330   < > = values in this interval not displayed.   Basic Metabolic Panel:  Recent Labs  Lab 09/12/20 0513 09/12/20 0919 09/12/20 1504 09/12/20 1850 09/12/20 2039 09/13/20 0447  NA 140   < >   < >  --  145 147*  K 3.5  --   --   --   --  3.1*  CL 109  --   --   --   --  115*  CO2 19*  --   --   --   --  21*  GLUCOSE 116*  --   --   --   --  161*  BUN 10  --   --   --   --  14  CREATININE 0.93  --   --   --   --  0.82  CALCIUM 8.6*  --   --   --   --  8.8*  MG  --    < >  --  2.0  --  2.0  PHOS  --    < >  --  1.7*  --  1.7*   < > = values in this interval not displayed.   Lipid Panel:     Component Value Date/Time   CHOL 122 09/12/2020 0513   TRIG 104 09/12/2020 0513   TRIG 114 09/12/2020 0513   HDL 40 (L) 09/12/2020 0513   CHOLHDL 3.1 09/12/2020 0513   VLDL 21 09/12/2020 0513   LDLCALC 61 09/12/2020 0513   HgbA1c:  Lab Results  Component Value Date   HGBA1C 5.6 09/12/2020   Urine Drug Screen:     Component Value Date/Time   LABOPIA NONE DETECTED 09/11/2020 0728   COCAINSCRNUR NONE DETECTED 09/11/2020 0728   COCAINSCRNUR NEGATIVE 02/03/2012 2215   LABBENZ NONE DETECTED 09/11/2020  0728   AMPHETMU POSITIVE (A) 09/11/2020 0728   THCU POSITIVE (A) 09/11/2020 0728   LABBARB NONE DETECTED 09/11/2020 0728    Alcohol Level     Component Value Date/Time   ETH <10 09/11/2020 0506    IMAGING  CT HEAD CODE STROKE WO CONTRAST 09/11/2020 1. Intraparenchymal hematoma in the left basal ganglia, measuring 24 mL. 2. No midline shift or hydrocephalus.   CT ANGIO HEAD W OR WO CONTRAST CT ANGIO NECK W OR WO CONTRAST 09/11/2020 Increase in size of left cerebral hemorrhage and surrounding edema.  Mass effect remains mild. No abnormal vascularity in the region of hemorrhage or evidence of active contrast extravasation. No hemodynamically significant stenosis.   CT HEAD WO CONTRAST 09/11/2020 Slightly increased size of left cerebral hemorrhage measuring 6.1 cm, previously 5.6 cm. Minimal intraventricular extension involving the left occipital horn, unchanged. Rightward midline shift of 3 mm and partial left lateral ventricle effacement, unchanged.   CT HEAD WO CONTRAST 09/13/2020 Nonprogressive left cerebral hematoma.No hydrocephalus.   ECHOCARDIOGRAM COMPLETE 09/11/2020 1. Left ventricular ejection fraction, by estimation, is 50 to 55%. The left ventricle has low normal function. The left ventricle has no regional wall motion abnormalities. There is mild left ventricular hypertrophy. Left ventricular diastolic parameters are consistent with Grade II diastolic dysfunction (pseudonormalization).   2. Right ventricular systolic function is normal. The right ventricular size is normal.  3 3. Left atrial size was mildly dilated.   4. The mitral valve is normal in structure. Mild to moderate mitral valve regurgitation. No evidence of mitral stenosis.   5. The aortic valve is normal in structure. Aortic valve regurgitation is not visualized. No aortic stenosis is present.   6. The inferior vena cava is normal in size with greater than 50% respiratory variability, suggesting right atrial  pressure of 3 mmHg.   DG CHEST PORT 1 VIEW 09/12/2020 1. No radiographic evidence of acute cardiopulmonary disease. 2. Cardiomegaly.     PHYSICAL EXAM   Temp:  [97.8 F (36.6 C)-98.8 F (37.1 C)] 98.8 F (37.1 C) (11/02 0854) Pulse Rate:  [61-155] 85 (11/02 1015) Resp:  [11-30] 27 (11/02 1015) BP: (117-184)/(69-111) 184/87 (11/02 1015) SpO2:  [91 %-100 %] 94 % (11/02 1015)  General - Well nourished, well developed, lethargic but eyes open spontaneously.  Ophthalmologic - fundi not visualized due to noncooperation.  Cardiovascular - irregularly irregular heart rate and rhythm.  Neuro - eyes open spontaneously.  Global aphasia, making sounds but intangible and not following commands. Eyes left gaze preference, not cross midline. Blinking to visual threat on the left but not on the right. PERRL. Right facial droop. Right UE slight extension on pain stimulation. Right LE slight withdraw to pain stimulation. Spontaneous moving LUE and LLE against gravity. Sensation, coordination and gait not tested.   ASSESSMENT/PLAN [name] is a 49 y.o. male with history of A. fib not on AC, seizure, MI, hypertension, smoker, OSA presenting with right-sided weakness, right facial droop, slurred speech. He did not receive IV t-PA due to ICH.   ICH - left BG ICH, likely hypertensive etiology  CT Head - left BG ICH  CTA H&N increase L BG ICH & edema. Vasculature unremarkable.  CT head repeat slight increase L BG ICH, minimal IVH. R shift 74mm, partial L ventricle effacement.  CT repeat 11/2 stable ICH and midline shift  2D Echo - EF 50-55%. Mild LVH. No source of embolus.  [employer] Virus 2 negative  LDL - 61  HgbA1c - 5.6  UDS positive for THC and amphetamine  VTE prophylaxis -SCDs  aspirin 81 mg daily prior to admission, now on No antithrombotic due to ICH  Ongoing aggressive stroke risk factor management  Therapy recommendations:  pending  Disposition:   Pending  Cerebral Edema  Increasing edema w/ midline shift per CT  CT repeat 11/2 stable ICH  On 3% saline at 50cc/hr  Goal 150-155   Na 139->140->142->144->145->147  Hypertensive emergency  Home BP meds: Not compliant with medication  maximized on Cleviprex  Stable on the high end  On amlodipine 10, labetolol 100->200 q8, lisinopril 20  . SBP goal <160 . Long-term BP goal normotensive  Chronic afib  Per wife, patient has chronic A. fib not on stronger blood thinners  Lost follow-up with cardiology as outpatient  Currently still in A. fib but rate controlled  No antithrombotic at this time due to ICH  Tobacco abuse  Current smoker, 2PPD  Smoking cessation counseling will be provided  Dysphagia  NPO  Did not pass swallow  Cortrak placement w/ TF @ 60   Speech on board  Leukocytosis   WBC 10.6->14.4->13.8. likely reactive.   CXR NAD   UA neg   CBC monitoring  Other Stroke Risk Factors  ETOH use, advised to drink no more than 1-2 drink(s) a day  Obesity, Body mass index is 32.65 kg/m., recommend weight loss, diet and exercise as appropriate   Coronary artery disease/MI  Obstructive sleep apnea  Substance Abuse - THC and amphetamine positive on UDS.  Wife admitted patient using THC, however not knowing about amphetamine  Other Active Problems  Code status - full code  Hx seizures    Hypokalemia K 2.8->3.2->3.1 - supplement  Hypophosphatemia Phos 1.7 - supplement  Hospital day # 2  This patient is critically ill due to left large basal ganglia hemorrhage, cerebral edema, hypertensive emergency, dysphagia, leukocytosis, chronic A. fib not on AC and at significant risk of neurological worsening, death form hematoma expansion, IVH, hydrocephalus, cerebral edema, brain herniation, sepsis, seizure, stroke. This patient's care requires constant monitoring of vital signs, hemodynamics, respiratory and cardiac monitoring, review of multiple  databases, neurological assessment, discussion with family, other specialists and medical decision making of high complexity. I spent 35 minutes of neurocritical care time in the care of this patient. I had long discussion with wife at bedside, updated pt current condition, treatment plan and potential prognosis, and answered all the questions.  She expressed understanding and appreciation.   Marvel Plan, MD PhD Stroke Neurology 09/13/2020 10:25 AM  To contact Stroke Continuity provider, please refer to [email or web address]. After hours, contact General Neurology

## 2020-09-13 NOTE — TOC CAGE-AID Note (Signed)
Transition of Care [facility]) - CAGE-AID Screening   Patient Details  Name: Jason Romero MRN: [id number] Date of Birth: [birth year]  Transition of [facility]) CM/SW Contact:    Jimmy Picket, [place] Phone Number: 09/13/2020, 3:38 PM   Clinical Narrative:  Patient is unable to participate in assessment due to being on vent.  CAGE-AID Screening: Substance Abuse Screening unable to be completed due to: : Patient unable to participate               Jason Romero Clinical Social Worker [phone]

## 2020-09-14 LAB — CBC
HCT: 38.5 % — ABNORMAL LOW (ref 39.0–52.0)
Hemoglobin: 12.4 g/dL — ABNORMAL LOW (ref 13.0–17.0)
MCH: 28.2 pg (ref 26.0–34.0)
MCHC: 32.2 g/dL (ref 30.0–36.0)
MCV: 87.5 fL (ref 80.0–100.0)
Platelets: 317 10*3/uL (ref 150–400)
RBC: 4.4 MIL/uL (ref 4.22–5.81)
RDW: 15.9 % — ABNORMAL HIGH (ref 11.5–15.5)
WBC: 10.9 10*3/uL — ABNORMAL HIGH (ref 4.0–10.5)
nRBC: 0 % (ref 0.0–0.2)

## 2020-09-14 LAB — SODIUM
Sodium: 146 mmol/L — ABNORMAL HIGH (ref 135–145)
Sodium: 149 mmol/L — ABNORMAL HIGH (ref 135–145)
Sodium: 149 mmol/L — ABNORMAL HIGH (ref 135–145)

## 2020-09-14 LAB — GLUCOSE, CAPILLARY
Glucose-Capillary: 138 mg/dL — ABNORMAL HIGH (ref 70–99)
Glucose-Capillary: 94 mg/dL (ref 70–99)
Glucose-Capillary: 135 mg/dL — ABNORMAL HIGH (ref 70–99)
Glucose-Capillary: 115 mg/dL — ABNORMAL HIGH (ref 70–99)
Glucose-Capillary: 126 mg/dL — ABNORMAL HIGH (ref 70–99)

## 2020-09-14 LAB — BASIC METABOLIC PANEL
Anion gap: 12 (ref 5–15)
BUN: 10 mg/dL (ref 6–20)
CO2: 21 mmol/L — ABNORMAL LOW (ref 22–32)
Calcium: 8.7 mg/dL — ABNORMAL LOW (ref 8.9–10.3)
Chloride: 115 mmol/L — ABNORMAL HIGH (ref 98–111)
Creatinine, Ser: 0.76 mg/dL (ref 0.61–1.24)
GFR, Estimated: >60 mL/min (ref >60)
Glucose, Bld: 126 mg/dL — ABNORMAL HIGH (ref 70–99)
Potassium: 3.3 mmol/L — ABNORMAL LOW (ref 3.5–5.1)
Sodium: 148 mmol/L — ABNORMAL HIGH (ref 135–145)

## 2020-09-14 MED ORDER — LABETALOL HCL 200 MG PO TABS
300.0000 mg | ORAL_TABLET | Freq: Three times a day (TID) | ORAL | Status: DC
  Administered 2020-09-14 – 2020-09-20 (×18): 300 mg
  Filled 2020-09-14 (×3): qty 1
  Filled 2020-09-14: qty 3
  Filled 2020-09-14: qty 1
  Filled 2020-09-14: qty 3
  Filled 2020-09-14 (×2): qty 1
  Filled 2020-09-14: qty 3
  Filled 2020-09-14 (×6): qty 1
  Filled 2020-09-14 (×2): qty 3

## 2020-09-14 MED ORDER — POTASSIUM CHLORIDE 20 MEQ PO PACK
40.0000 meq | PACK | ORAL | Status: AC
  Administered 2020-09-14 (×2): 40 meq
  Filled 2020-09-14 (×2): qty 2

## 2020-09-14 MED ORDER — CLEVIDIPINE BUTYRATE 0.5 MG/ML IV EMUL
INTRAVENOUS | Status: AC
  Filled 2020-09-14: qty 50

## 2020-09-14 NOTE — Progress Notes (Signed)
Inpatient Rehabilitation Admissions Coordinator  I met at bedside with patient and then contacted his wife by phone. We discussed goals and expectations of a possible CIR admit . She is in agreement., I will follow pt's progress to assist with planning Cir admit when pt medically ready.  Danne Baxter, RN, MSN Rehab Admissions Coordinator [phone] 09/14/2020 3:46 PM

## 2020-09-14 NOTE — Progress Notes (Signed)
Physical Therapy Treatment Patient Details Name: Jason Romero MRN: [id number] DOB: August 30, 1971 Today's Date: 09/14/2020    History of Present Illness 49 yo male admitted to [facility] on 10/31 after being found by wife slumped in bathroom with R deficits. CT head reveals intraparenchymal hematoma in L BG, repeat head CT shows increased cerebral hemorrhage with R midline shift 3 mm. PMH includes afib, COPD, GERD, HTN, MI.    PT Comments    Pt motivated to participate in PT today. Pt presenting with contraversive pushing this session, improved with max multimodal cuing for pt's L hand in lap. Pt able to sit unsupported at EOB for brief periods, when externally cued for midline from PT. Pt overall requiring max +2 assist for bed mobility and EOB tasks today, stand attempted twice but unsuccessful due to pt fatigue and difficulty sequencing task. PT continuing to recommend CIR post-acutely, will continue to follow.    Follow Up Recommendations  CIR;Supervision/Assistance - 24 hour     Equipment Recommendations  Other (comment) (TBD)    Recommendations for Other Services       Precautions / Restrictions Precautions Precautions: Fall Precaution Comments: R hemiplegia, R inattention, contraversive pushing Restrictions Weight Bearing Restrictions: No    Mobility  Bed Mobility Overal bed mobility: Needs Assistance Bed Mobility: Rolling;Sit to Supine;Supine to Sit Rolling: Min assist;Max assist   Supine to sit: Max assist;+2 for physical assistance;+2 for safety/equipment;HOB elevated Sit to supine: Max assist;+2 for physical assistance;+2 for safety/equipment;HOB elevated   General bed mobility comments: Min assist for roll R for completion of trunk translation, max assist for roll L given R-sided weakness. Max +2 for supine<>sit for trunk and LE management, scooting to and from EOB, and boost up in bed.  Transfers Overall transfer level: Needs assistance   Transfers: Lateral/Scoot  Transfers          Lateral/Scoot Transfers: Total assist;+2 physical assistance General transfer comment: total +2 for scoot to R for higher positioning in bed before retrun to supine. X2 stand attempts, pt able to clear buttocks only with total +2.  Ambulation/Gait                 Stairs             Wheelchair Mobility    Modified Rankin (Stroke Patients Only) Modified Rankin (Stroke Patients Only) Pre-Morbid Rankin Score: No symptoms Modified Rankin: Severe disability     Balance Overall balance assessment: Needs assistance Sitting-balance support: Feet supported Sitting balance-Leahy Scale: Fair Sitting balance - Comments: Initially poor sitting balance with contraversive pushing (using LUE to push to R), requires max multimodal cuing for bringing hand to lap and external PT cuing for sitting upright. Pt able to sit unsuported x30 seconds, otherwise requires min-mod assist posteriorly and to midline from R Postural control: Posterior lean;Right lateral lean   Standing balance-Leahy Scale: Zero Standing balance comment: NT                            Cognition Arousal/Alertness: Awake/alert Behavior During Therapy: Restless;Impulsive Overall Cognitive Status: Impaired/Different from baseline Area of Impairment: Following commands;Safety/judgement;Awareness;Problem solving                       Following Commands: Follows one step commands consistently Safety/Judgement: Decreased awareness of safety;Decreased awareness of deficits Awareness: Emergent Problem Solving: Difficulty sequencing;Requires verbal cues;Requires tactile cues General Comments: Follows commands quickly and correctly when given verbal command  when moving with L side. Inconsistently answers orientation questions correctly (states "no" not in the hospital, appropriately responded "yes" to name and reason for admission).      Exercises Other Exercises Other Exercises:  RUE propped on pillows for proper alignment and edema management    General Comments        Pertinent Vitals/Pain Pain Assessment: Faces Faces Pain Scale: Hurts little more Pain Location: generalized with mobility, reaching for R arm Pain Descriptors / Indicators: Discomfort;Grimacing;Restless Pain Intervention(s): Limited activity within patient's tolerance;Monitored during session;Repositioned    Home Living                      Prior Function            PT Goals (current goals can now be found in the care plan section) Acute Rehab PT Goals Patient Stated Goal: none explicitly stated PT Goal Formulation: With patient Time For Goal Achievement: 09/27/20 Potential to Achieve Goals: Good Progress towards PT goals: Progressing toward goals    Frequency    Min 4X/week      PT Plan Current plan remains appropriate    Co-evaluation              AM-PAC PT "6 Clicks" Mobility   Outcome Measure  Help needed turning from your back to your side while in a flat bed without using bedrails?: A Lot Help needed moving from lying on your back to sitting on the side of a flat bed without using bedrails?: Total Help needed moving to and from a bed to a chair (including a wheelchair)?: Total Help needed standing up from a chair using your arms (e.g., wheelchair or bedside chair)?: Total Help needed to walk in hospital room?: Total Help needed climbing 3-5 steps with a railing? : Total 6 Click Score: 7    End of Session   Activity Tolerance: Patient limited by fatigue Patient left: in bed;with call bell/phone within reach;with bed alarm set;with nursing/sitter in room;with SCD's reapplied Nurse Communication: Mobility status PT Visit Diagnosis: Hemiplegia and hemiparesis;Muscle weakness (generalized) (M62.81) Hemiplegia - Right/Left: Right Hemiplegia - dominant/non-dominant: Non-dominant Hemiplegia - caused by: Nontraumatic intracerebral hemorrhage     Time:  1430-1510 PT Time Calculation (min) (ACUTE ONLY): 40 min  Charges:  $Therapeutic Activity: 23-37 mins $Neuromuscular Re-education: 8-22 mins                     Maxene Byington E, PT Acute Rehabilitation Services Pager [phone]  Office [phone]    Tyrone Apple D Despina Hidden 09/14/2020, 3:43 PM

## 2020-09-14 NOTE — Progress Notes (Signed)
STROKE TEAM PROGRESS NOTE   INTERVAL HISTORY RN at bedside.  Patient more awake alert, eyes open, still has global aphasia, and right hemiplegia.  BP improved, still on Cleviprex, will increase p.o. BP meds.  Repeat CT tomorrow.  If stable, consider taper off 3% saline.  PT/OT recommend CIR.  OBJECTIVE Vitals:   09/14/20 0600 09/14/20 0615 09/14/20 0630 09/14/20 0645  BP: (!) 156/103 (!) 168/113 (!) 160/109 (!) 163/102  Pulse: 78 83 82 79  Resp: 17 18 18 16   Temp:      TempSrc:      SpO2: 93% 93% 97% 97%  Weight:      Height:       CBC:  Recent Labs  Lab 09/11/20 0507 09/12/20 0513 09/13/20 0447 09/14/20 0232  WBC 10.6*   < > 13.8* 10.9*  NEUTROABS 6.6  --   --   --   HGB 13.5   < > 12.9* 12.4*  HCT 41.6   < > 39.4 38.5*  MCV 86.8   < > 87.0 87.5  PLT 346   < > 330 317   < > = values in this interval not displayed.   Basic Metabolic Panel:  Recent Labs  Lab 09/13/20 0447 09/13/20 0919 09/13/20 1654 09/13/20 2211 09/14/20 0232 09/14/20 0909  NA 147*   < >  --    < > 148* 146*  K 3.1*  --   --   --  3.3*  --   CL 115*  --   --   --  115*  --   CO2 21*  --   --   --  21*  --   GLUCOSE 161*  --   --   --  126*  --   BUN 14  --   --   --  10  --   CREATININE 0.82  --   --   --  0.76  --   CALCIUM 8.8*  --   --   --  8.7*  --   MG 2.0  --  1.9  --   --   --   PHOS 1.7*  --  3.3  --   --   --    < > = values in this interval not displayed.   Lipid Panel:     Component Value Date/Time   CHOL 122 09/12/2020 0513   TRIG 104 09/12/2020 0513   TRIG 114 09/12/2020 0513   HDL 40 (L) 09/12/2020 0513   CHOLHDL 3.1 09/12/2020 0513   VLDL 21 09/12/2020 0513   LDLCALC 61 09/12/2020 0513   HgbA1c:  Lab Results  Component Value Date   HGBA1C 5.6 09/12/2020   Urine Drug Screen:     Component Value Date/Time   LABOPIA NONE DETECTED 09/11/2020 0728   COCAINSCRNUR NONE DETECTED 09/11/2020 0728   COCAINSCRNUR NEGATIVE 02/03/2012 2215   LABBENZ NONE DETECTED 09/11/2020  0728   AMPHETMU POSITIVE (A) 09/11/2020 0728   THCU POSITIVE (A) 09/11/2020 0728   LABBARB NONE DETECTED 09/11/2020 0728    Alcohol Level     Component Value Date/Time   ETH <10 09/11/2020 0506    IMAGING  CT HEAD CODE STROKE WO CONTRAST 09/11/2020 1. Intraparenchymal hematoma in the left basal ganglia, measuring 24 mL. 2. No midline shift or hydrocephalus.   CT ANGIO HEAD W OR WO CONTRAST CT ANGIO NECK W OR WO CONTRAST 09/11/2020 Increase in size of left cerebral hemorrhage and surrounding edema. Mass effect  remains mild. No abnormal vascularity in the region of hemorrhage or evidence of active contrast extravasation. No hemodynamically significant stenosis.   CT HEAD WO CONTRAST 09/11/2020 Slightly increased size of left cerebral hemorrhage measuring 6.1 cm, previously 5.6 cm. Minimal intraventricular extension involving the left occipital horn, unchanged. Rightward midline shift of 3 mm and partial left lateral ventricle effacement, unchanged.   CT HEAD WO CONTRAST 09/13/2020 Nonprogressive left cerebral hematoma.No hydrocephalus.   ECHOCARDIOGRAM COMPLETE 09/11/2020 1. Left ventricular ejection fraction, by estimation, is 50 to 55%. The left ventricle has low normal function. The left ventricle has no regional wall motion abnormalities. There is mild left ventricular hypertrophy. Left ventricular diastolic parameters are consistent with Grade II diastolic dysfunction (pseudonormalization).   2. Right ventricular systolic function is normal. The right ventricular size is normal.  3 3. Left atrial size was mildly dilated.   4. The mitral valve is normal in structure. Mild to moderate mitral valve regurgitation. No evidence of mitral stenosis.   5. The aortic valve is normal in structure. Aortic valve regurgitation is not visualized. No aortic stenosis is present.   6. The inferior vena cava is normal in size with greater than 50% respiratory variability, suggesting right atrial  pressure of 3 mmHg.   DG CHEST PORT 1 VIEW 09/12/2020 1. No radiographic evidence of acute cardiopulmonary disease. 2. Cardiomegaly.     PHYSICAL EXAM   Temp:  [96.9 F (36.1 C)-98.7 F (37.1 C)] 98.7 F (37.1 C) (11/03 0400) Pulse Rate:  [41-149] 79 (11/03 0645) Resp:  [14-27] 16 (11/03 0645) BP: (120-174)/(75-158) 163/102 (11/03 0645) SpO2:  [90 %-99 %] 97 % (11/03 0645)  General - Well nourished, well developed, not in acute distress.  Ophthalmologic - fundi not visualized due to noncooperation.  Cardiovascular - irregularly irregular heart rate and rhythm.  Neuro - eyes open spontaneously.  Global aphasia, making sounds but largely intangible and not following commands. Eyes left gaze preference, barely cross midline today. Blinking to visual threat on the left but not on the right. PERRL. Right facial droop. Right UE slight extension on pain stimulation. Right LE slight withdraw to pain stimulation. Spontaneous moving LUE and LLE against gravity. Sensation, coordination not cooperative and gait not tested.   ASSESSMENT/PLAN Mr. HOBSON LAX is a 49 y.o. male with history of A. fib not on AC, seizure, MI, hypertension, smoker, OSA presenting with right-sided weakness, right facial droop, slurred speech. He did not receive IV t-PA due to ICH.   ICH - left BG ICH, likely hypertensive etiology  CT Head - left BG ICH  CTA H&N increase L BG ICH & edema. Vasculature unremarkable.  CT head repeat slight increase L BG ICH, minimal IVH. R shift 64mm, partial L ventricle effacement.  CT repeat 11/2 stable ICH and midline shift  CT repeat pending in a.m.  2D Echo - EF 50-55%. Mild LVH. No source of embolus.  [employer] Virus 2 negative  LDL - 61  HgbA1c - 5.6  UDS positive for THC and amphetamine  VTE prophylaxis -SCDs  aspirin 81 mg daily prior to admission, now on No antithrombotic due to ICH  Therapy recommendations:  CIR  Disposition:  Pending  Cerebral  Edema  Increasing edema w/ midline shift per CT  CT repeat 11/2 stable ICH  CT repeat in a.m. - if stable gradually taper off 3%  On 3% saline at 50cc/hr  Goal 150-155   Na 139->140->142->144->145->147->149->148->146  Hypertensive emergency  Home BP meds: Not compliant  with medication  Still on Cleviprex, taper off if able  Stable on the high end  On amlodipine 10, labetolol 100->200->300 q8, lisinopril 20 twice daily . SBP goal <160 . Long-term BP goal normotensive  Chronic afib  Per wife, patient has chronic A. fib not on stronger blood thinners  Lost follow-up with cardiology as outpatient  Currently still in A. fib but rate controlled  No antithrombotic at this time due to ICH  Tobacco abuse  Current smoker, 2PPD  Smoking cessation counseling will be provided  Dysphagia  NPO  Did not pass swallow  Cortrak placement w/ TF @ 60   Speech on board  Leukocytosis   WBC 10.6->14.4->13.8->10.9. likely reactive.   CXR NAD   UA neg   CBC monitoring  Other Stroke Risk Factors  ETOH use, advised to drink no more than 1-2 drink(s) a day  Obesity, Body mass index is 32.65 kg/m., recommend weight loss, diet and exercise as appropriate   Coronary artery disease/MI  Obstructive sleep apnea  Substance Abuse - THC and amphetamine positive on UDS.  Wife admitted patient using THC, however not knowing about amphetamine  Other Active Problems  Code status - full code  Hx seizures    Hypokalemia K 2.8->3.2->3.1->3.3 - supplement  Hypophosphatemia Phos 1.7 - supplement  Hospital day # 3  This patient is critically ill due to large left BG hemorrhage, hypertensive emergency, dysphagia, cerebral edema, chronic A. fib and at significant risk of neurological worsening, death form brain herniation, hematoma expansion, hydrocephalus, seizure, heart failure, stroke. This patient's care requires constant monitoring of vital signs, hemodynamics, respiratory  and cardiac monitoring, review of multiple databases, neurological assessment, discussion with family, other specialists and medical decision making of high complexity. I spent 30 minutes of neurocritical care time in the care of this patient.   Marvel Plan, MD PhD Stroke Neurology 09/14/2020 11:41 AM  To contact Stroke Continuity provider, please refer to [email or web address]. After hours, contact General Neurology

## 2020-09-15 ENCOUNTER — Inpatient Hospital Stay (HOSPITAL_COMMUNITY): Payer: Medicaid Other

## 2020-09-15 LAB — GLUCOSE, CAPILLARY
Glucose-Capillary: 102 mg/dL — ABNORMAL HIGH (ref 70–99)
Glucose-Capillary: 138 mg/dL — ABNORMAL HIGH (ref 70–99)
Glucose-Capillary: 104 mg/dL — ABNORMAL HIGH (ref 70–99)
Glucose-Capillary: 121 mg/dL — ABNORMAL HIGH (ref 70–99)
Glucose-Capillary: 112 mg/dL — ABNORMAL HIGH (ref 70–99)
Glucose-Capillary: 121 mg/dL — ABNORMAL HIGH (ref 70–99)

## 2020-09-15 LAB — CBC
HCT: 41.1 % (ref 39.0–52.0)
Hemoglobin: 13.4 g/dL (ref 13.0–17.0)
MCH: 28.5 pg (ref 26.0–34.0)
MCHC: 32.6 g/dL (ref 30.0–36.0)
MCV: 87.3 fL (ref 80.0–100.0)
Platelets: 291 10*3/uL (ref 150–400)
RBC: 4.71 MIL/uL (ref 4.22–5.81)
RDW: 15.9 % — ABNORMAL HIGH (ref 11.5–15.5)
WBC: 9.3 10*3/uL (ref 4.0–10.5)
nRBC: 0 % (ref 0.0–0.2)

## 2020-09-15 LAB — BASIC METABOLIC PANEL
Anion gap: 11 (ref 5–15)
BUN: 17 mg/dL (ref 6–20)
CO2: 21 mmol/L — ABNORMAL LOW (ref 22–32)
Calcium: 9.2 mg/dL (ref 8.9–10.3)
Chloride: 115 mmol/L — ABNORMAL HIGH (ref 98–111)
Creatinine, Ser: 0.83 mg/dL (ref 0.61–1.24)
GFR, Estimated: >60 mL/min (ref >60)
Glucose, Bld: 113 mg/dL — ABNORMAL HIGH (ref 70–99)
Potassium: 3.7 mmol/L (ref 3.5–5.1)
Sodium: 147 mmol/L — ABNORMAL HIGH (ref 135–145)

## 2020-09-15 LAB — SODIUM
Sodium: 148 mmol/L — ABNORMAL HIGH (ref 135–145)
Sodium: 150 mmol/L — ABNORMAL HIGH (ref 135–145)
Sodium: 147 mmol/L — ABNORMAL HIGH (ref 135–145)

## 2020-09-15 MED ORDER — HYDRALAZINE HCL 50 MG PO TABS
50.0000 mg | ORAL_TABLET | Freq: Three times a day (TID) | ORAL | Status: DC
  Filled 2020-09-15: qty 1

## 2020-09-15 MED ORDER — POTASSIUM CHLORIDE 20 MEQ PO PACK
40.0000 meq | PACK | Freq: Once | ORAL | Status: AC
  Administered 2020-09-15: 40 meq
  Filled 2020-09-15: qty 2

## 2020-09-15 MED ORDER — SODIUM CHLORIDE 3 % IV SOLN
INTRAVENOUS | Status: DC
  Filled 2020-09-15 (×2): qty 500

## 2020-09-15 MED ORDER — HYDRALAZINE HCL 50 MG PO TABS
50.0000 mg | ORAL_TABLET | Freq: Three times a day (TID) | ORAL | Status: DC
  Administered 2020-09-15 – 2020-09-16 (×3): 50 mg
  Filled 2020-09-15 (×2): qty 1

## 2020-09-15 NOTE — Progress Notes (Signed)
Physical Therapy Treatment Patient Details Name: Jason Romero MRN: [id number] DOB: May 15, 1971 Today's Date: 09/15/2020    History of Present Illness 49 yo male admitted to [facility] on 10/31 after being found by wife slumped in bathroom with R deficits. CT head reveals intraparenchymal hematoma in L BG, repeat head CT shows increased cerebral hemorrhage with R midline shift 3 mm. PMH includes afib, COPD, GERD, HTN, MI.    PT Comments    Pt showed some improvement over last session.  Pt still showed significant neglect on the right needing maximal cues to acknowledge and reach to manage R UE> R LE.  Pt also continued to display impulsiveness, but could follow simple instruction if cues given in conjunction.  He was able to break out of pushing behaviors on several occasions, able to sit upright into midline with max assist.  Emphasis also on sit to stand, coming to a submaximal upright posture with blocking of his R knee.   Follow Up Recommendations  CIR;Supervision/Assistance - 24 hour     Equipment Recommendations  Other (comment) (TBD)    Recommendations for Other Services       Precautions / Restrictions Precautions Precautions: Fall Precaution Comments: R hemiplegia, R inattention, contraversive pushing    Mobility  Bed Mobility Overal bed mobility: Needs Assistance Bed Mobility: Rolling;Supine to Sit;Sit to Supine Rolling: Min assist;Mod assist   Supine to sit: Min guard;+2 for safety/equipment;+2 for physical assistance Sit to supine: Max assist;+2 for physical assistance;+2 for safety/equipment   General bed mobility comments: Pt rolls to Rt with min A and to Lt with mod A.  He requires assist for LEs and trunk when moving sit <> supine   Transfers Overall transfer level: Needs assistance Equipment used: None Transfers: Sit to/from Stand Sit to Stand: Max assist;+2 physical assistance;+2 safety/equipment         General transfer comment: assist to block Rt knee and  assist to lift buttocks and assist for balance and trunk control   Ambulation/Gait                 Stairs             Wheelchair Mobility    Modified Rankin (Stroke Patients Only)       Balance Overall balance assessment: Needs assistance Sitting-balance support: Feet supported;Single extremity supported Sitting balance-Leahy Scale: Poor Sitting balance - Comments: Pt initially required max A +3 to maintain midline sitting, but progressed to close min guard assist with Lt UE support.  While EOB, worked on anterior pelvic tilt and thoracic extension as well as rotation to the Rt   Postural control: Right lateral lean   Standing balance-Leahy Scale: Zero Standing balance comment: Pt requires max A +2 , 2 standing trials                            Cognition Arousal/Alertness: Awake/alert Behavior During Therapy: Restless;Impulsive Overall Cognitive Status: Difficult to assess                           Safety/Judgement: Decreased awareness of safety     General Comments: Pt follows one step commands consistently when gestural cues provided.  He is impulsive and demonstrates Rt inattention/neglect       Exercises      General Comments General comments (skin integrity, edema, etc.): Pt requires max cues to locate Rt UE  Pertinent Vitals/Pain Pain Assessment: Faces Faces Pain Scale: No hurt    Home Living                      Prior Function            PT Goals (current goals can now be found in the care plan section) Acute Rehab PT Goals PT Goal Formulation: With patient Time For Goal Achievement: 09/27/20 Potential to Achieve Goals: Good Progress towards PT goals: Progressing toward goals    Frequency    Min 4X/week      PT Plan Current plan remains appropriate    Co-evaluation PT/OT/SLP Co-Evaluation/Treatment: Yes Reason for Co-Treatment: Complexity of the patient's impairments (multi-system  involvement) PT goals addressed during session: Mobility/safety with mobility;Strengthening/ROM OT goals addressed during session: ADL's and self-care      AM-PAC PT "6 Clicks" Mobility   Outcome Measure  Help needed turning from your back to your side while in a flat bed without using bedrails?: A Lot Help needed moving from lying on your back to sitting on the side of a flat bed without using bedrails?: A Little Help needed moving to and from a bed to a chair (including a wheelchair)?: Total Help needed standing up from a chair using your arms (e.g., wheelchair or bedside chair)?: Total Help needed to walk in hospital room?: Total Help needed climbing 3-5 steps with a railing? : Total 6 Click Score: 9    End of Session   Activity Tolerance: Patient limited by fatigue Patient left: in bed;with call bell/phone within reach;with bed alarm set;with SCD's reapplied Nurse Communication: Mobility status PT Visit Diagnosis: Hemiplegia and hemiparesis;Muscle weakness (generalized) (M62.81) Hemiplegia - Right/Left: Right Hemiplegia - dominant/non-dominant: Non-dominant Hemiplegia - caused by: Nontraumatic intracerebral hemorrhage     Time: 9169-4503 PT Time Calculation (min) (ACUTE ONLY): 37 min  Charges:  $Neuromuscular Re-education: 8-22 mins                     09/15/2020  Jacinto Halim., PT Acute Rehabilitation Services [phone]  (pager) [phone]  (office)   Eliseo Gum Christianne Zacher 09/15/2020, 6:54 PM

## 2020-09-15 NOTE — Progress Notes (Signed)
Occupational Therapy Treatment Patient Details Name: Jason Romero MRN: [id number] DOB: 29-Jul-1971 Today's Date: 09/15/2020    History of present illness 49 yo male admitted to [facility] on 10/31 after being found by wife slumped in bathroom with R deficits. CT head reveals intraparenchymal hematoma in L BG, repeat head CT shows increased cerebral hemorrhage with R midline shift 3 mm. PMH includes afib, COPD, GERD, HTN, MI.   OT comments  Pt demonstrates increased awareness of Rt side as he will look to Rt to interact with therapist with only min cues, and locates Rt UE with min - mod cues.  He requires max A +2 for bed mobility, sits EOB with max A +2 progressing to min A +2.  He follows commands well when multi modal cues provided. Rt UE with no active movement - appears flaccid.   He requires mod - total A for ADLs.  Continue to recommend CIR.   Follow Up Recommendations  CIR    Equipment Recommendations  None recommended by OT    Recommendations for Other Services Rehab consult    Precautions / Restrictions Precautions Precautions: Fall Precaution Comments: R hemiplegia, R inattention, contraversive pushing       Mobility Bed Mobility Overal bed mobility: Needs Assistance Bed Mobility: Rolling;Supine to Sit;Sit to Supine Rolling: Min assist;Mod assist   Supine to sit: Min guard;+2 for safety/equipment;+2 for physical assistance Sit to supine: Max assist;+2 for physical assistance;+2 for safety/equipment   General bed mobility comments: Pt rolls to Rt with min A and to Lt with mod A.  He requires assist for LEs and trunk when moving sit <> supine   Transfers Overall transfer level: Needs assistance Equipment used: None Transfers: Sit to/from Stand Sit to Stand: Max assist;+2 physical assistance;+2 safety/equipment         General transfer comment: assist to block Rt knee and assist to lift buttocks and assist for balance and trunk control     Balance Overall balance  assessment: Needs assistance Sitting-balance support: Feet supported;Single extremity supported Sitting balance-Leahy Scale: Poor Sitting balance - Comments: Pt initially required max A +3 to maintain midline sitting, but progressed to close min guard assist with Lt UE support.  While EOB, worked on anterior pelvic tilt and thoracic extension as well as rotation to the Rt   Postural control: Right lateral lean   Standing balance-Leahy Scale: Zero Standing balance comment: Pt requires max A +2                            ADL either performed or assessed with clinical judgement   ADL Overall ADL's : Needs assistance/impaired     Grooming: Wash/dry hands;Wash/dry face;Moderate assistance;Sitting               Lower Body Dressing: Maximal assistance;+2 for physical assistance;+2 for safety/equipment Lower Body Dressing Details (indicate cue type and reason): pt able to doff socks with mod A for sitting balance              Functional mobility during ADLs: Maximal assistance;Total assistance;+2 for physical assistance       Vision   Additional Comments: Pt with Lt gaze preference.  With mod multi modal cues, he will look to the Rt but avoids looking fully to the Rt    Perception     Praxis      Cognition Arousal/Alertness: Awake/alert Behavior During Therapy: Restless;Impulsive Overall Cognitive Status: Difficult to assess  Safety/Judgement: Decreased awareness of safety     General Comments: Pt follows one step commands consistently when gestural cues provided.  He is impulsive and demonstrates Rt inattention/neglect         Exercises     Shoulder Instructions       General Comments Pt requires max cues to locate Rt UE     Pertinent Vitals/ Pain       Pain Assessment: Faces Faces Pain Scale: No hurt  Home Living                                          Prior Functioning/Environment               Frequency  Min 2X/week        Progress Toward Goals  OT Goals(current goals can now be found in the care plan section)  Progress towards OT goals: Progressing toward goals     Plan Discharge plan remains appropriate    Co-evaluation    PT/OT/SLP Co-Evaluation/Treatment: Yes Reason for Co-Treatment: Complexity of the patient's impairments (multi-system involvement);To address functional/ADL transfers;For patient/therapist safety   OT goals addressed during session: ADL's and self-care      AM-PAC OT "6 Clicks" Daily Activity     Outcome Measure   Help from another person eating meals?: Total Help from another person taking care of personal grooming?: A Lot Help from another person toileting, which includes using toliet, bedpan, or urinal?: Total Help from another person bathing (including washing, rinsing, drying)?: A Lot Help from another person to put on and taking off regular upper body clothing?: A Lot Help from another person to put on and taking off regular lower body clothing?: Total 6 Click Score: 9    End of Session    OT Visit Diagnosis: Hemiplegia and hemiparesis;Cognitive communication deficit (R41.841) Symptoms and signs involving cognitive functions: Cerebral infarction Hemiplegia - Right/Left: Right Hemiplegia - dominant/non-dominant: Non-Dominant Hemiplegia - caused by: Nontraumatic intracerebral hemorrhage   Activity Tolerance Patient tolerated treatment well   Patient Left in bed;with call bell/phone within reach;with chair alarm set   Nurse Communication Mobility status        Time: 1638-4665 OT Time Calculation (min): 36 min  Charges: OT General Charges $OT Visit: 1 Visit OT Treatments $Neuromuscular Re-education: 8-22 mins  [name]., OTR/L Acute Rehabilitation Services Pager [phone] Office [phone]    [name] M 09/15/2020, 5:06 PM

## 2020-09-15 NOTE — Progress Notes (Signed)
STROKE TEAM PROGRESS NOTE   INTERVAL HISTORY Wife at bedside. Pt neuro stable, eyes open, awake alert, frustrated with expressive aphasia. Able to follow most simple commands at left today. Still not able to name or repeat. Repeat CT head stable will taper off 3% saline. Also add hydralazine for BP control.   OBJECTIVE Vitals:   09/15/20 0600 09/15/20 0700 09/15/20 0800 09/15/20 0900  BP: (!) 160/96 (!) 148/96 (!) 117/94   Pulse: 83 78 78   Resp: 19 16 18 14   Temp:   98.4 F (36.9 C)   TempSrc:      SpO2: 95% 93% 97%   Weight:      Height:       CBC:  Recent Labs  Lab 09/11/20 0507 09/12/20 0513 09/14/20 0232 09/15/20 0535  WBC 10.6*   < > 10.9* 9.3  NEUTROABS 6.6  --   --   --   HGB 13.5   < > 12.4* 13.4  HCT 41.6   < > 38.5* 41.1  MCV 86.8   < > 87.5 87.3  PLT 346   < > 317 291   < > = values in this interval not displayed.   Basic Metabolic Panel:  Recent Labs  Lab 09/13/20 0447 09/13/20 0919 09/13/20 1654 09/13/20 2211 09/14/20 0232 09/14/20 0909 09/15/20 0535 09/15/20 0855  NA 147*   < >  --    < > 148*   < > 147* 148*  K 3.1*  --   --   --  3.3*  --  3.7  --   CL 115*  --   --   --  115*  --  115*  --   CO2 21*  --   --   --  21*  --  21*  --   GLUCOSE 161*  --   --   --  126*  --  113*  --   BUN 14  --   --   --  10  --  17  --   CREATININE 0.82  --   --   --  0.76  --  0.83  --   CALCIUM 8.8*  --   --   --  8.7*  --  9.2  --   MG 2.0  --  1.9  --   --   --   --   --   PHOS 1.7*  --  3.3  --   --   --   --   --    < > = values in this interval not displayed.   Lipid Panel:     Component Value Date/Time   CHOL 122 09/12/2020 0513   TRIG 104 09/12/2020 0513   TRIG 114 09/12/2020 0513   HDL 40 (L) 09/12/2020 0513   CHOLHDL 3.1 09/12/2020 0513   VLDL 21 09/12/2020 0513   LDLCALC 61 09/12/2020 0513   HgbA1c:  Lab Results  Component Value Date   HGBA1C 5.6 09/12/2020   Urine Drug Screen:     Component Value Date/Time   LABOPIA NONE DETECTED  09/11/2020 0728   COCAINSCRNUR NONE DETECTED 09/11/2020 0728   COCAINSCRNUR NEGATIVE 02/03/2012 2215   LABBENZ NONE DETECTED 09/11/2020 0728   AMPHETMU POSITIVE (A) 09/11/2020 0728   THCU POSITIVE (A) 09/11/2020 0728   LABBARB NONE DETECTED 09/11/2020 0728    Alcohol Level     Component Value Date/Time   ETH <10 09/11/2020 0506    IMAGING  CT HEAD CODE STROKE WO CONTRAST 09/11/2020 1. Intraparenchymal hematoma in the left basal ganglia, measuring 24 mL. 2. No midline shift or hydrocephalus.   CT ANGIO HEAD W OR WO CONTRAST CT ANGIO NECK W OR WO CONTRAST 09/11/2020 Increase in size of left cerebral hemorrhage and surrounding edema. Mass effect remains mild. No abnormal vascularity in the region of hemorrhage or evidence of active contrast extravasation. No hemodynamically significant stenosis.   CT HEAD WO CONTRAST 09/11/2020 Slightly increased size of left cerebral hemorrhage measuring 6.1 cm, previously 5.6 cm. Minimal intraventricular extension involving the left occipital horn, unchanged. Rightward midline shift of 3 mm and partial left lateral ventricle effacement, unchanged.   CT HEAD WO CONTRAST 09/13/2020 Nonprogressive left cerebral hematoma.No hydrocephalus.   CT HEAD WO CONTRAST 09/15/2020 IMPRESSION: No substantial change in the left cerebral intraparenchymal hemorrhage.  ECHOCARDIOGRAM COMPLETE 09/11/2020 1. Left ventricular ejection fraction, by estimation, is 50 to 55%. The left ventricle has low normal function. The left ventricle has no regional wall motion abnormalities. There is mild left ventricular hypertrophy. Left ventricular diastolic parameters are consistent with Grade II diastolic dysfunction (pseudonormalization).   2. Right ventricular systolic function is normal. The right ventricular size is normal.  3 3. Left atrial size was mildly dilated.   4. The mitral valve is normal in structure. Mild to moderate mitral valve regurgitation. No evidence of  mitral stenosis.   5. The aortic valve is normal in structure. Aortic valve regurgitation is not visualized. No aortic stenosis is present.   6. The inferior vena cava is normal in size with greater than 50% respiratory variability, suggesting right atrial pressure of 3 mmHg.   DG CHEST PORT 1 VIEW 09/12/2020 1. No radiographic evidence of acute cardiopulmonary disease. 2. Cardiomegaly.     PHYSICAL EXAM  Temp:  [98 F (36.7 C)-99 F (37.2 C)] 98.4 F (36.9 C) (11/04 0800) Pulse Rate:  [42-126] 78 (11/04 0800) Resp:  [12-27] 14 (11/04 0900) BP: (117-161)/(71-132) 117/94 (11/04 0800) SpO2:  [92 %-100 %] 97 % (11/04 0800)  General - Well nourished, well developed, not in acute distress.  Ophthalmologic - fundi not visualized due to noncooperation.  Cardiovascular - irregularly irregular heart rate and rhythm.  Neuro - eyes open spontaneously.  Expressive aphasia, making sounds but largely intangible, not able to name or repeat. However, he was able to follow simple commands on the left hand and foot. Eyes left gaze preference, able to cross midline but incomplete with right gaze. Blinking to visual threat on the left but not on the right. PERRL. Right facial droop. Right UE slight extension on pain stimulation. Right LE slight withdraw to pain stimulation. Spontaneous moving LUE and LLE against gravity. Sensation, coordination not cooperative and gait not tested.   ASSESSMENT/PLAN Mr. VRISHANK MOSTER is a 49 y.o. male with history of A. fib not on AC, seizure, MI, hypertension, smoker, OSA presenting with right-sided weakness, right facial droop, slurred speech. He did not receive IV t-PA due to ICH.   ICH - left BG ICH, likely hypertensive etiology  CT Head - left BG ICH  CTA H&N increase L BG ICH & edema. Vasculature unremarkable.  CT head repeat slight increase L BG ICH, minimal IVH. R shift 74mm, partial L ventricle effacement.  CT repeat 11/2 stable ICH and midline shift  CT  repeat 11/4 - No substantial change in the left ICH.  2D Echo - EF 50-55%. Mild LVH. No source of embolus.  [employer] Virus 2  negative  LDL - 61  HgbA1c - 5.6  UDS positive for THC and amphetamine  VTE prophylaxis -SCDs  aspirin 81 mg daily prior to admission, now on No antithrombotic due to ICH  Therapy recommendations:  CIR  Disposition:  Pending  Cerebral Edema  Increasing edema w/ midline shift per CT  CT repeat 11/2 stable ICH  CT repeat 11/4 stable ICH  On 3% saline at 50cc/hr -> 25cc  Goal 150-155   Na 139->140->142->144->145->147->149->148->146->148->147->148  Hypertensive emergency  Home BP meds: Not compliant with medication  Still on Cleviprex, taper off if able  Stable on the high end  On amlodipine 10, labetolol 100->200->300 q8, lisinopril 20 twice daily  Add hydralazine 50 Q8 . SBP goal <160 . Long-term BP goal normotensive  Chronic afib  Per wife, patient has chronic A. fib not on stronger blood thinners  Lost follow-up with cardiology as outpatient  Currently still in A. fib but rate controlled  No antithrombotic at this time due to ICH  Tobacco abuse  Current smoker, 2PPD  Smoking cessation counseling will be provided  Dysphagia  NPO  Did not pass swallow  Cortrak placement w/ TF @ 60   Speech on board  Leukocytosis   WBC 10.6->14.4->13.8->10.9->9.3 - likely reactive.   CXR NAD   UA neg   CBC monitoring  Other Stroke Risk Factors  ETOH use, advised to drink no more than 1-2 drink(s) a day  Obesity, Body mass index is 32.65 kg/m., recommend weight loss, diet and exercise as appropriate   Coronary artery disease/MI  Obstructive sleep apnea  Substance Abuse - THC and amphetamine positive on UDS.  Wife admitted patient using THC, however not knowing about amphetamine  Other Active Problems  Code status - full code  Hx seizures    Hypokalemia K 2.8->3.2->3.1->3.3 - supplement  ->3.7  Hypophosphatemia Phos 1.7 - supplement ->3.3  Hospital day # 4  This patient is critically ill due to left BG ICH, cerebral edema, hypertensive emergency, chronic A. fib and at significant risk of neurological worsening, death form hematoma expansion, brain herniation, seizure, status epilepticus, hypertensive encephalopathy, heart failure, stroke. This patient's care requires constant monitoring of vital signs, hemodynamics, respiratory and cardiac monitoring, review of multiple databases, neurological assessment, discussion with family, other specialists and medical decision making of high complexity. I spent 40 minutes of neurocritical care time in the care of this patient. I had long discussion with wife at bedside, updated pt current condition, treatment plan and potential prognosis, and answered all the questions.  She expressed understanding and appreciation.    Marvel Plan, MD PhD Stroke Neurology 09/15/2020 9:11 PM   To contact Stroke Continuity provider, please refer to [email or web address]. After hours, contact General Neurology

## 2020-09-15 NOTE — Progress Notes (Signed)
  Speech Language Pathology Treatment: Dysphagia;Cognitive-Linquistic  Patient Details Name: Jason Romero MRN: [id number] DOB: 08/05/1971 Today's Date: 09/15/2020 Time: 3810-1751 SLP Time Calculation (min) (ACUTE ONLY): 25 min  Assessment / Plan / Recommendation Clinical Impression  Pt is more alert this morning compared to initial evaluation, although still presenting significant speech and language impairments. He will turn toward his R side but needs consistent cues to do so. Attempted confrontational naming tasks with one response close to target but c/b phonemic paraphasias. Pt was not able to respond to other trials despite cues and even model to attempt repetition at the word level. Pt seems to show awareness as evidenced by his frustration level. SLP also provided PO trials with oral care provided first for removal of larger amounts of dried secretions across his hard palate and posterior lingual surface. There is frequent coughing across thin liquid and pureed trials. SLP attempted to decreased frequency with cues for small sips, slow rate, even different head positions, but no clinical signs of improvement are noted. Pt will benefit from MBS to better evaluate oropharyngeal swallow to better provide therapeutic interventions. Will tentatively plan for next date. For today, would focus heavily on oral care which may facilitate testing on next date.    HPI HPI: The pt is a 49 yo male presenting with R-sided weakness and found to have intraparenchymal hematoma in the left basal ganglia on original CT. Repeat imaging revealed increased hemorrhage with 23mm midline shift. PMH includes: GERD, seizures, HTN, COPD, MI, afib, and current tobacco use.       SLP Plan  MBS       Recommendations  Diet recommendations: NPO Medication Administration: Via alternative means                Oral Care Recommendations: Oral care QID Follow up Recommendations: Inpatient Rehab SLP Visit Diagnosis:  Dysarthria and anarthria (R47.1);Aphasia (R47.01);Dysphagia, unspecified (R13.10) Plan: MBS       GO                Mahala Menghini., M.A. CCC-SLP Acute Rehabilitation Services Pager [phone] Office [phone]  09/15/2020, 9:12 AM

## 2020-09-16 ENCOUNTER — Inpatient Hospital Stay (HOSPITAL_COMMUNITY): Payer: Medicaid Other

## 2020-09-16 LAB — BASIC METABOLIC PANEL
Anion gap: 12 (ref 5–15)
BUN: 31 mg/dL — ABNORMAL HIGH (ref 6–20)
CO2: 19 mmol/L — ABNORMAL LOW (ref 22–32)
Calcium: 9.2 mg/dL (ref 8.9–10.3)
Chloride: 117 mmol/L — ABNORMAL HIGH (ref 98–111)
Creatinine, Ser: 0.92 mg/dL (ref 0.61–1.24)
GFR, Estimated: >60 mL/min (ref >60)
Glucose, Bld: 108 mg/dL — ABNORMAL HIGH (ref 70–99)
Potassium: 3.9 mmol/L (ref 3.5–5.1)
Sodium: 148 mmol/L — ABNORMAL HIGH (ref 135–145)

## 2020-09-16 LAB — CBC
HCT: 44.2 % (ref 39.0–52.0)
Hemoglobin: 14.4 g/dL (ref 13.0–17.0)
MCH: 28 pg (ref 26.0–34.0)
MCHC: 32.6 g/dL (ref 30.0–36.0)
MCV: 85.8 fL (ref 80.0–100.0)
Platelets: 297 10*3/uL (ref 150–400)
RBC: 5.15 MIL/uL (ref 4.22–5.81)
RDW: 15.9 % — ABNORMAL HIGH (ref 11.5–15.5)
WBC: 9.6 10*3/uL (ref 4.0–10.5)
nRBC: 0 % (ref 0.0–0.2)

## 2020-09-16 LAB — GLUCOSE, CAPILLARY
Glucose-Capillary: 108 mg/dL — ABNORMAL HIGH (ref 70–99)
Glucose-Capillary: 142 mg/dL — ABNORMAL HIGH (ref 70–99)
Glucose-Capillary: 103 mg/dL — ABNORMAL HIGH (ref 70–99)
Glucose-Capillary: 128 mg/dL — ABNORMAL HIGH (ref 70–99)
Glucose-Capillary: 97 mg/dL (ref 70–99)

## 2020-09-16 LAB — MAGNESIUM: Magnesium: 2.4 mg/dL (ref 1.7–2.4)

## 2020-09-16 MED ORDER — LABETALOL HCL 5 MG/ML IV SOLN
10.0000 mg | INTRAVENOUS | Status: DC | PRN
  Administered 2020-09-16: 20 mg via INTRAVENOUS
  Administered 2020-09-16: 10 mg via INTRAVENOUS
  Filled 2020-09-16 (×2): qty 4

## 2020-09-16 MED ORDER — HYDRALAZINE HCL 50 MG PO TABS: 75.0000 mg | ORAL_TABLET | Freq: Three times a day (TID) | ORAL | Status: DC

## 2020-09-16 MED ORDER — HYDRALAZINE HCL 50 MG PO TABS
100.0000 mg | ORAL_TABLET | Freq: Three times a day (TID) | ORAL | Status: DC
  Administered 2020-09-16 – 2020-09-20 (×12): 100 mg
  Filled 2020-09-16 (×13): qty 2

## 2020-09-16 NOTE — Progress Notes (Signed)
Inpatient Rehabilitation Admissions Coordinator   I will follow up on pt's progress on Monday.  Ottie Glazier, RN, MSN Rehab Admissions Coordinator [phone] 09/16/2020 3:18 PM

## 2020-09-16 NOTE — Progress Notes (Signed)
Pt transferred to 3W12; A & O  To self, follows commands. Pt moved from bed to bed. Pt reconnected to tube feeding. Pt stable with no s/s of distress. Pt oriented to unit.

## 2020-09-16 NOTE — Progress Notes (Signed)
Around 1720 this evening, patient was left in chair by PT/OT. At 1740 this RN heard the patient fall to the floor from the nurses' station (immediately in front of the patient's room). This nurse ran to the room along with Londell Moh, Everette Rank, [address] and [place]. Patient was found lying on right side. Assessment indicated no immediate injuries and patient denied any pain. Staff used lift to return patient to bed. Vitals were checked and pupils assessed along with a neuro exam. The only change was elevated BP at 179/114. Dr. Roda Shutters was contacted and gave verbal order for 20mg  labetalol IV and continue plan to transfer if BP is reduced by medication. Love Milbourne (pt's wife) was contacted and was appreciative of communication and agrees with plan to transfer as long as patient remains stable.  Was the fall witnessed: no  Patient condition before and after the fall: unchanged  Patient's reaction to the fall: no reaction  Name of the doctor that was notified including date and time: Dr. Bridgett Larsson 11/5; 1745  Any interventions and vital signs: see above

## 2020-09-16 NOTE — Progress Notes (Signed)
Occupational Therapy Treatment Patient Details Name: Jason Romero MRN: [id number] DOB: 09-12-1971 Today's Date: 09/16/2020    History of present illness 49 yo male admitted to [facility] on 10/31 after being found by wife slumped in bathroom with R deficits. CT head reveals intraparenchymal hematoma in L BG, repeat head CT shows increased cerebral hemorrhage with R midline shift 3 mm. PMH includes afib, COPD, GERD, HTN, MI.   OT comments  Pt seen for follow up OT session with focus on mobility progression, R facilitatory techniques, and verbalizing wants/needs. Pt completed bed mobility with mod A. Once EOB, pt worked on attaining upright sitting balance with RUE as dependent stabilizer and required +2 for safety. He has a tendency to over compensate on the L side which throws off his balance entirely on the R side. Pt had pushed entire weight on L side to shift hips, and lost balance into OTs arms on R with little awareness. He then completed stand pivot transfer with max A +2 to recliner. He is showing some behaviors of pushing on L side in transfers and functional activities. Had pt practice verbalizing simple words to communicate wants/needs. Continues to remain impulsive and need increased time to process basic commands. Also needed explanation as to why he cannot eat at this time. CIR remains excellent d/c plan. Will continue to follow.   Follow Up Recommendations  CIR    Equipment Recommendations  None recommended by OT    Recommendations for Other Services Rehab consult    Precautions / Restrictions Precautions Precautions: Fall Precaution Comments: R hemiplegia, R inattention, contraversive pushing       Mobility Bed Mobility Overal bed mobility: Needs Assistance Bed Mobility: Supine to Sit;Sit to Supine     Supine to sit: Mod assist     General bed mobility comments: Assist with LE's and trunk, pivot assist with pad.  pt needed assist with w/shift and scoot assist for  unilateral scoot.  Transfers Overall transfer level: Needs assistance Equipment used: 2 person hand held assist Transfers: Sit to/from [employer] Sit to Stand: Max assist;+2 physical assistance;+2 safety/equipment Stand pivot transfers: Max assist;+2 physical assistance       General transfer comment: assist to block Rt knee and assist to lift buttocks and assist for balance and trunk control.  The squat pivot turned into a stand pivot with pt pushing against traveling toward the chair.    Balance Overall balance assessment: Needs assistance Sitting-balance support: Feet supported;Single extremity supported Sitting balance-Leahy Scale: Poor Sitting balance - Comments: pt generally with tendency to push toward the right side that he has little awareness of, but he can be facilitated into an upright sitting posture at EOB with techniques to inhibit overpushing with the L UE. Postural control: Right lateral lean   Standing balance-Leahy Scale: Zero Standing balance comment: Pt requires max A +2 , 2 standing trials                           ADL either performed or assessed with clinical judgement   ADL Overall ADL's : Needs assistance/impaired Eating/Feeding: NPO   Grooming: Wash/dry hands;Wash/dry face;Moderate assistance;Sitting               Lower Body Dressing: Maximal assistance;+2 for physical assistance;+2 for safety/equipment Lower Body Dressing Details (indicate cue type and reason): pt able to doff socks with mod A for sitting balance  General ADL Comments: session focused on R sided facilitation techniques, verbalizing words, trunk control and coordination     Vision Baseline Vision/History: No visual deficits Vision Assessment?: Vision impaired- to be further tested in functional context Additional Comments: Left gaze preference- multimodal cues to attend to R side   Perception     Praxis      Cognition  Arousal/Alertness: Awake/alert Behavior During Therapy: Restless;Impulsive Overall Cognitive Status: Difficult to assess Area of Impairment: Following commands;Safety/judgement;Awareness;Problem solving                       Following Commands: Follows one step commands with increased time Safety/Judgement: Decreased awareness of safety   Problem Solving: Difficulty sequencing;Requires verbal cues;Requires tactile cues General Comments: requires simple short commands to be successful. Remains impulsive with poor awareness of deficits        Exercises     Shoulder Instructions       General Comments L gaze preference, but can track past midline with max cuing.  Impulsive in general and due to low awareness of R side frequently lunges forward and to the right when executing a scoot or repositioning in sitting.  Follows simple commands in conjunction with cues, but does not fully understand the commands verbally.    Pertinent Vitals/ Pain       Pain Assessment: No/denies pain Faces Pain Scale: No hurt  Home Living                                          Prior Functioning/Environment              Frequency  Min 2X/week        Progress Toward Goals  OT Goals(current goals can now be found in the care plan section)  Progress towards OT goals: Progressing toward goals  Acute Rehab OT Goals OT Goal Formulation: Patient unable to participate in goal setting Time For Goal Achievement: 09/27/20 Potential to Achieve Goals: Good  Plan Discharge plan remains appropriate    Co-evaluation    PT/OT/SLP Co-Evaluation/Treatment: Yes Reason for Co-Treatment: For patient/therapist safety;To address functional/ADL transfers;Complexity of the patient's impairments (multi-system involvement) PT goals addressed during session: Mobility/safety with mobility OT goals addressed during session: ADL's and self-care;Strengthening/ROM      AM-PAC OT "6  Clicks" Daily Activity     Outcome Measure   Help from another person eating meals?: Total Help from another person taking care of personal grooming?: A Lot Help from another person toileting, which includes using toliet, bedpan, or urinal?: Total Help from another person bathing (including washing, rinsing, drying)?: A Lot Help from another person to put on and taking off regular upper body clothing?: A Lot Help from another person to put on and taking off regular lower body clothing?: Total 6 Click Score: 9    End of Session    OT Visit Diagnosis: Hemiplegia and hemiparesis;Cognitive communication deficit (R41.841) Symptoms and signs involving cognitive functions: Cerebral infarction Hemiplegia - Right/Left: Right Hemiplegia - dominant/non-dominant: Non-Dominant Hemiplegia - caused by: Nontraumatic intracerebral hemorrhage   Activity Tolerance Patient tolerated treatment well   Patient Left with call bell/phone within reach;with chair alarm set;in chair   Nurse Communication Mobility status        Time: 6962-9528 OT Time Calculation (min): 31 min  Charges: OT General Charges $OT Visit: 1 Visit OT Treatments $Self Care/Home  Management : 8-22 mins  Dalphine Handing, MSOT, OTR/L Acute Rehabilitation Services [facility] Office Number: [phone] Pager: [phone]  Dalphine Handing 09/16/2020, 6:13 PM

## 2020-09-16 NOTE — Progress Notes (Signed)
STROKE TEAM PROGRESS NOTE   INTERVAL HISTORY RN at the bedside. Pt off cleviprex and also off 3% saline this am. Had MBS but still not able to pass swallow, currently NPO on cortrak. BP still on the high end, will increase hydralazine.   OBJECTIVE Vitals:   09/16/20 0645 09/16/20 0700 09/16/20 0800 09/16/20 0900  BP: (!) 139/102 (!) 137/94 137/77 (!) 153/106  Pulse: 84 85 92 95  Resp: (!) 24 16 18 14   Temp:   99.2 F (37.3 C)   TempSrc:   Axillary   SpO2: 94% 98% 99% 99%  Weight:      Height:       CBC:  Recent Labs  Lab 09/11/20 0507 09/12/20 0513 09/15/20 0535 09/16/20 0424  WBC 10.6*   < > 9.3 9.6  NEUTROABS 6.6  --   --   --   HGB 13.5   < > 13.4 14.4  HCT 41.6   < > 41.1 44.2  MCV 86.8   < > 87.3 85.8  PLT 346   < > 291 297   < > = values in this interval not displayed.   Basic Metabolic Panel:  Recent Labs  Lab 09/13/20 0447 09/13/20 0919 09/13/20 1654 09/13/20 2211 09/15/20 0535 09/15/20 0855 09/15/20 2038 09/16/20 0424  NA 147*   < >  --    < > 147*   < > 147* 148*  K 3.1*  --   --    < > 3.7  --   --  3.9  CL 115*  --   --    < > 115*  --   --  117*  CO2 21*  --   --    < > 21*  --   --  19*  GLUCOSE 161*  --   --    < > 113*  --   --  108*  BUN 14  --   --    < > 17  --   --  31*  CREATININE 0.82  --   --    < > 0.83  --   --  0.92  CALCIUM 8.8*  --   --    < > 9.2  --   --  9.2  MG 2.0  --  1.9  --   --   --   --  2.4  PHOS 1.7*  --  3.3  --   --   --   --   --    < > = values in this interval not displayed.   Lipid Panel:     Component Value Date/Time   CHOL 122 09/12/2020 0513   TRIG 104 09/12/2020 0513   TRIG 114 09/12/2020 0513   HDL 40 (L) 09/12/2020 0513   CHOLHDL 3.1 09/12/2020 0513   VLDL 21 09/12/2020 0513   LDLCALC 61 09/12/2020 0513   HgbA1c:  Lab Results  Component Value Date   HGBA1C 5.6 09/12/2020   Urine Drug Screen:     Component Value Date/Time   LABOPIA NONE DETECTED 09/11/2020 0728   COCAINSCRNUR NONE DETECTED  09/11/2020 0728   COCAINSCRNUR NEGATIVE 02/03/2012 2215   LABBENZ NONE DETECTED 09/11/2020 0728   AMPHETMU POSITIVE (A) 09/11/2020 0728   THCU POSITIVE (A) 09/11/2020 0728   LABBARB NONE DETECTED 09/11/2020 0728    Alcohol Level     Component Value Date/Time   ETH <10 09/11/2020 0506    IMAGING  CT HEAD CODE  STROKE WO CONTRAST 09/11/2020 1. Intraparenchymal hematoma in the left basal ganglia, measuring 24 mL. 2. No midline shift or hydrocephalus.   CT ANGIO HEAD W OR WO CONTRAST CT ANGIO NECK W OR WO CONTRAST 09/11/2020 Increase in size of left cerebral hemorrhage and surrounding edema. Mass effect remains mild. No abnormal vascularity in the region of hemorrhage or evidence of active contrast extravasation. No hemodynamically significant stenosis.   CT HEAD WO CONTRAST 09/11/2020 Slightly increased size of left cerebral hemorrhage measuring 6.1 cm, previously 5.6 cm. Minimal intraventricular extension involving the left occipital horn, unchanged. Rightward midline shift of 3 mm and partial left lateral ventricle effacement, unchanged.   CT HEAD WO CONTRAST 09/13/2020 Nonprogressive left cerebral hematoma.No hydrocephalus.   CT HEAD WO CONTRAST 09/15/2020 No substantial change in the left cerebral intraparenchymal hemorrhage.  ECHOCARDIOGRAM COMPLETE 09/11/2020 1. Left ventricular ejection fraction, by estimation, is 50 to 55%. The left ventricle has low normal function. The left ventricle has no regional wall motion abnormalities. There is mild left ventricular hypertrophy. Left ventricular diastolic parameters are consistent with Grade II diastolic dysfunction (pseudonormalization).   2. Right ventricular systolic function is normal. The right ventricular size is normal.  3 3. Left atrial size was mildly dilated.   4. The mitral valve is normal in structure. Mild to moderate mitral valve regurgitation. No evidence of mitral stenosis.   5. The aortic valve is normal in  structure. Aortic valve regurgitation is not visualized. No aortic stenosis is present.   6. The inferior vena cava is normal in size with greater than 50% respiratory variability, suggesting right atrial pressure of 3 mmHg.   DG CHEST PORT 1 VIEW 09/12/2020 1. No radiographic evidence of acute cardiopulmonary disease. 2. Cardiomegaly.     PHYSICAL EXAM   Temp:  [97.5 F (36.4 C)-99.8 F (37.7 C)] 99.2 F (37.3 C) (11/05 0800) Pulse Rate:  [49-97] 95 (11/05 0900) Resp:  [12-25] 14 (11/05 0900) BP: (123-182)/(77-123) 153/106 (11/05 0900) SpO2:  [91 %-100 %] 99 % (11/05 0900)  General - Well nourished, well developed, not in acute distress.  Ophthalmologic - fundi not visualized due to noncooperation.  Cardiovascular - irregularly irregular heart rate and rhythm.  Neuro - eyes open spontaneously.  Expressive aphasia, making sounds but largely intangible, not able to name or repeat. However, he was able to follow simple commands on the left hand and foot. Eyes left gaze preference, able to cross midline but incomplete with right gaze. Blinking to visual threat on the left but not on the right. PERRL. Right facial droop. Right UE slight extension on pain stimulation. Right LE slight withdraw to pain stimulation. Spontaneous moving LUE and LLE against gravity. Sensation, coordination not cooperative and gait not tested.   ASSESSMENT/PLAN [name] is a 49 y.o. male with history of A. fib not on AC, seizure, MI, hypertension, smoker, OSA presenting with right-sided weakness, right facial droop, slurred speech. He did not receive IV t-PA due to ICH.   ICH - left BG ICH, likely hypertensive etiology  CT Head - left BG ICH  CTA H&N increase L BG ICH & edema. Vasculature unremarkable.  CT head repeat slight increase L BG ICH, minimal IVH. R shift 58mm, partial L ventricle effacement.  CT repeat 11/2 stable ICH and midline shift  CT repeat 11/4 - No substantial change in the left  ICH.  2D Echo - EF 50-55%. Mild LVH. No source of embolus.  Sars Corona Virus 2 negative  LDL -  61  HgbA1c - 5.6  UDS positive for THC and amphetamine  VTE prophylaxis -SCDs  aspirin 81 mg daily prior to admission, now on No antithrombotic due to ICH  Therapy recommendations:  CIR  Disposition:  Pending  Cerebral Edema  Increasing edema w/ midline shift per CT  CT repeat 11/2 stable ICH  CT repeat 11/4 stable ICH  On 3% saline at 50cc/hr -> 25cc->off  Goal 150-155   Na 150->147->148  Hypertensive emergency  Home BP meds: Not compliant with medication  Now off Cleviprex  Stable on the high end  Labetalol IV PRN  On amlodipine 10, labetolol 100->200->300 q8, lisinopril 20 twice daily  Add hydralazine 50->75->100 Q8 . SBP goal <160 . Long-term BP goal normotensive  Chronic afib  Per wife, patient has chronic A. fib not on stronger blood thinners  Lost follow-up with cardiology as outpatient  Currently still in A. fib but rate controlled  No antithrombotic at this time due to ICH  Tobacco abuse  Current smoker, 2PPD  Smoking cessation counseling will be provided  Dysphagia  NPO  Did not pass swallow  Cortrak placement w/ TF @ 60   MBSS - NPO  Speech on board  Leukocytosis, resolved   WBC 10.6->14.4->13.8->10.9->9.3->9.6 - likely reactive.   CXR NAD   UA neg   CBC monitoring  Other Stroke Risk Factors  ETOH use, advised to drink no more than 1-2 drink(s) a day  Obesity, Body mass index is 32.65 kg/m., recommend weight loss, diet and exercise as appropriate   Coronary artery disease/MI  Obstructive sleep apnea  Substance Abuse - THC and amphetamine positive on UDS.  Wife admitted patient using THC, however not knowing about amphetamine  Other Active Problems  Code status - full code  Hx seizures    Hypokalemia K 2.8->3.2->3.1->3.3 - supplement ->3.7->3.9  Hypophosphatemia Phos 1.7 - supplement ->3.3  Hospital day  # 5  This patient is critically ill due to large left BG ICH, cerebral edema, dysphagia, afib chronic and at significant risk of neurological worsening, death form brain herniation, aspiration, heart failure, ischemic stroke. This patient's care requires constant monitoring of vital signs, hemodynamics, respiratory and cardiac monitoring, review of multiple databases, neurological assessment, discussion with family, other specialists and medical decision making of high complexity. I spent 30 minutes of neurocritical care time in the care of this patient.   Marvel Plan, MD PhD Stroke Neurology 09/16/2020 9:38 AM   To contact Stroke Continuity provider, please refer to [email or web address]. After hours, contact General Neurology

## 2020-09-16 NOTE — Progress Notes (Signed)
Modified Barium Swallow Progress Note  Patient Details  Name: Jason Romero MRN: [id number] Date of Birth: 04/19/1971  Today's Date: 09/16/2020  Modified Barium Swallow completed.  Full report located under Chart Review in the Imaging Section.  Brief recommendations include the following:  Clinical Impression   Pt has significant difficulties with lingual manipulation and transit, spontaneously using a posterior head tilt move boluses back toward his pharynx. As a result, all of the thin liquid from the spoon that is able to reach his pharynx, spills directly into the airway before he can even attempt to swallow. Strong coughing occurs, but he does not clear his airway. The slower the consistencies move, the more time this gives him to work on lingual propulsion and airway closure. When he can coordinate this timing he has much better airway protection, but there is also often more residue in his oral cavity and down through the base of his tongue. Nectar thick liquids are aspirated before the swallow with a similar cough response and honey thick liquids are penetrated, eventually falling to the vocal folds without attempts at clearance. Purees do not enter the airway but again, leave more residue. Attempted to utilize strategies throughout the study such as a chin tuck, but pt is not following commands enough to carry them out. Recommend that he remain NPO but will initiate some therapeutic boluses within SLP visits. If oral control and clearance improve, he may be able to start a PO diet at least with pureed textures.    Swallow Evaluation Recommendations       SLP Diet Recommendations: NPO;Alternative means - temporary     Medication Administration: Via alternative means   Oral Care Recommendations: Oral care QID   Other Recommendations: Have oral suction available    Mahala Menghini., M.A. CCC-SLP Acute Rehabilitation Services Pager [phone] Office [phone]  09/16/2020,2:36  PM

## 2020-09-16 NOTE — Progress Notes (Signed)
Physical Therapy Treatment Patient Details Name: Jason Romero MRN: [id number] DOB: Jan 15, 1971 Today's Date: 09/16/2020    History of Present Illness 49 yo male admitted to [facility] on 10/31 after being found by wife slumped in bathroom with R deficits. CT head reveals intraparenchymal hematoma in L BG, repeat head CT shows increased cerebral hemorrhage with R midline shift 3 mm. PMH includes afib, COPD, GERD, HTN, MI.    PT Comments    Pt slowly progressing toward goals hindered mostly by low sensory input R and low awareness to the R.  Emphasis on transition to sitting, w/shifting/scooting to EOB, sitting balance, working on sit to stand with R side blocked and pivot transfer toward L stronger side.    Follow Up Recommendations  CIR;Supervision/Assistance - 24 hour     Equipment Recommendations  Other (comment) (TBD)    Recommendations for Other Services       Precautions / Restrictions Precautions Precautions: Fall Precaution Comments: R hemiplegia, R inattention, contraversive pushing    Mobility  Bed Mobility Overal bed mobility: Needs Assistance Bed Mobility: Rolling;Supine to Sit;Sit to Supine     Supine to sit: Mod assist     General bed mobility comments: Assist with LE's and trunk, pivot assist with pad.  pt need assist with w/shift and soot assist for unilateral scoot.  Transfers Overall transfer level: Needs assistance Equipment used: None Transfers: Sit to/from [employer] Sit to Stand: Max assist;+2 physical assistance;+2 safety/equipment Stand pivot transfers: Max assist;+2 physical assistance       General transfer comment: assist to block Rt knee and assist to lift buttocks and assist for balance and trunk control.  The squat pivot turned into a stand pivot with pt pushing against traveling toward the chair.  Ambulation/Gait                 Stairs             Wheelchair Mobility    Modified Rankin (Stroke Patients  Only) Modified Rankin (Stroke Patients Only) Pre-Morbid Rankin Score: No symptoms Modified Rankin: Severe disability     Balance Overall balance assessment: Needs assistance Sitting-balance support: Feet supported;Single extremity supported Sitting balance-Leahy Scale: Poor Sitting balance - Comments: pt generally with tendency to push toward the right side that he has little awareness of, but he can be facilitated into an upright sitting posture at EOB with techniques to inhibit overpushing with the L UE. Postural control: Right lateral lean   Standing balance-Leahy Scale: Zero Standing balance comment: Pt requires max A +2 , 2 standing trials                            Cognition Arousal/Alertness: Awake/alert Behavior During Therapy: Restless;Impulsive Overall Cognitive Status: Difficult to assess                           Safety/Judgement: Decreased awareness of safety     General Comments: Similar to last sesson, pt follows one step commands consistently when gestural cues provided.  He is impulsive and demonstrates Rt inattention/neglect       Exercises      General Comments General comments (skin integrity, edema, etc.): L gaze preference, but can track past midline with max cuing.  Impulsive in general and due to low awareness of R side frequently lunges forward and to the right when executing a scoot or repositioning in sitting.  Follows simple commands in conjunction with cues, but does not fully understand the commands verbally.      Pertinent Vitals/Pain Faces Pain Scale: No hurt    Home Living                      Prior Function            PT Goals (current goals can now be found in the care plan section) Acute Rehab PT Goals PT Goal Formulation: With patient Time For Goal Achievement: 09/27/20 Potential to Achieve Goals: Good Progress towards PT goals: Progressing toward goals    Frequency    Min 4X/week      PT  Plan Current plan remains appropriate    Co-evaluation PT/OT/SLP Co-Evaluation/Treatment: Yes Reason for Co-Treatment: Complexity of the patient's impairments (multi-system involvement) PT goals addressed during session: Mobility/safety with mobility        AM-PAC PT "6 Clicks" Mobility   Outcome Measure  Help needed turning from your back to your side while in a flat bed without using bedrails?: A Lot Help needed moving from lying on your back to sitting on the side of a flat bed without using bedrails?: A Lot Help needed moving to and from a bed to a chair (including a wheelchair)?: Total Help needed standing up from a chair using your arms (e.g., wheelchair or bedside chair)?: Total Help needed to walk in hospital room?: Total Help needed climbing 3-5 steps with a railing? : Total 6 Click Score: 8    End of Session   Activity Tolerance: Patient limited by fatigue Patient left: with call bell/phone within reach;in chair;with chair alarm set;Other (comment) (lift pad) Nurse Communication: Mobility status PT Visit Diagnosis: Hemiplegia and hemiparesis;Muscle weakness (generalized) (M62.81) Hemiplegia - Right/Left: Right Hemiplegia - dominant/non-dominant: Non-dominant Hemiplegia - caused by: Nontraumatic intracerebral hemorrhage     Time: 6195-0932 PT Time Calculation (min) (ACUTE ONLY): 35 min  Charges:  $Neuromuscular Re-education: 8-22 mins                     09/16/2020  Jacinto Halim., PT Acute Rehabilitation Services [phone]  (pager) [phone]  (office)   Eliseo Gum Caylon Saine 09/16/2020, 6:02 PM

## 2020-09-17 ENCOUNTER — Encounter (HOSPITAL_COMMUNITY): Payer: Self-pay | Admitting: Neurology

## 2020-09-17 DIAGNOSIS — F1910 Other psychoactive substance abuse, uncomplicated: Secondary | ICD-10-CM

## 2020-09-17 DIAGNOSIS — Z720 Tobacco use: Secondary | ICD-10-CM

## 2020-09-17 DIAGNOSIS — I10 Essential (primary) hypertension: Secondary | ICD-10-CM

## 2020-09-17 DIAGNOSIS — I169 Hypertensive crisis, unspecified: Secondary | ICD-10-CM

## 2020-09-17 LAB — GLUCOSE, CAPILLARY
Glucose-Capillary: 115 mg/dL — ABNORMAL HIGH (ref 70–99)
Glucose-Capillary: 124 mg/dL — ABNORMAL HIGH (ref 70–99)
Glucose-Capillary: 130 mg/dL — ABNORMAL HIGH (ref 70–99)
Glucose-Capillary: 139 mg/dL — ABNORMAL HIGH (ref 70–99)
Glucose-Capillary: 139 mg/dL — ABNORMAL HIGH (ref 70–99)
Glucose-Capillary: 145 mg/dL — ABNORMAL HIGH (ref 70–99)

## 2020-09-17 NOTE — Consult Note (Signed)
Medical Consultation   [name]  VZC:[id number]  DOB: February 16, 1971  DOA: 09/11/2020  PCP: Patient, No Pcp Per   Outpatient Specialists: None   Requesting physician: Lucia Gaskins - neurology  Reason for consultation: L basal ganglia ICH, hypertensive. Hospitalist service apparently takes over hemorrhage patients and assumes care once the patient is released out of the ICU, and TRH will be primary.  He is stable, on 4 HTN meds with persistent mild HTN.  NPO on Cortrak, pending CIR but not seen by them yet (PT has recommended).  History of Present Illness: Jason Romero is an 49 y.o. male with h/o seizures; CAD; HTN; COPD; and afib who presented as a code stroke on 10/31; he was found to have ICH causing hemorrhagic stroke with hypertensive emergency.  The patient had a significant stroke - he has R hemiplegia as well as aphasia and dysphagia.  He was unaccompanied at the time of my evaluation.   Review of Systems:  ROS Unable to perform   Past Medical History: Past Medical History:  Diagnosis Date  . A-fib (HCC)   . Chronic headaches   . COPD (chronic obstructive pulmonary disease) (HCC)   . GERD (gastroesophageal reflux disease)   . History of blood transfusion   . Hypertension   . MI (myocardial infarction) (HCC)   . Seizures (HCC)     Past Surgical History: Past Surgical History:  Procedure Laterality Date  . CHOLECYSTECTOMY       Allergies:   Allergies  Allergen Reactions  . Hydrocodone Hives  . Ibuprofen Other (See Comments)    Pt can't take this medication because it interacts with other medications that he is taking.      Social History:  reports that he has been smoking. He has been smoking about 1.00 pack per day. He has never used smokeless tobacco. He reports current alcohol use. He reports current drug use. Drug: Marijuana.   Family History: Family History  Problem Relation Age of Onset  . Alcoholism Other   . Arthritis Other     . Breast cancer Other   . Heart disease Mother       Physical Exam: Vitals:   09/17/20 1100 09/17/20 1200 09/17/20 1300 09/17/20 1307  BP:    (!) 138/110  Pulse: 96 88 93 86  Resp: (!) 21 15 (!) 22 18  Temp:    97.9 F (36.6 C)  TempSrc:      SpO2: 99% 97% 96% 97%  Weight:      Height:        Constitutional: Alert and awake, oriented x1, generally aphasic Eyes:  EOMI, irises appear normal, anicteric sclera,  ENMT: external ears and nose appear normal, normal hearing, Lips appear normal; Cortrak in place with TF running Neck: neck appears normal, no masses, normal ROM, no thyromegaly, no JVD  CVS: S1-S2 clear, no murmur rubs or gallops, no LE edema, normal pedal pulses  Respiratory:  clear to auscultation bilaterally, no wheezing, rales or rhonchi. Respiratory effort normal. No accessory muscle use.  Abdomen: soft nontender, nondistended Musculoskeletal: : R hemiplegia, unable to move RUE or RLE even against gravity Neuro: R facial droop Psych: unable to effectively assess, mildly agitated Skin: no rashes or lesions or ulcers, no induration or nodules    Data reviewed:  I have personally reviewed the recent labs and imaging studies  Pertinent Labs:   Na++ 148 -  stable CO2 19 Glucose 108 Unremarkable CBC   Inpatient Medications:   Scheduled Meds: . amLODipine  10 mg Per Tube Daily  . chlorhexidine  15 mL Mouth Rinse BID  . Chlorhexidine Gluconate Cloth  6 each Topical Daily  . feeding supplement (PROSource TF)  45 mL Per Tube TID  . hydrALAZINE  100 mg Per Tube Q8H  . labetalol  300 mg Per Tube Q8H  . lisinopril  20 mg Per Tube BID  . mouth rinse  15 mL Mouth Rinse q12n4p  . pantoprazole sodium  40 mg Per Tube Daily  . sennosides  5 mL Per Tube BID   Continuous Infusions: . feeding supplement (OSMOLITE 1.5 CAL) 1,000 mL (09/16/20 0410)     Radiological Exams on Admission: DG Swallowing Func-Speech Pathology  Result Date: 09/16/2020 Objective Swallowing  Evaluation: Type of Study: MBS-Modified Barium Swallow Study  Patient Details Name: Jason Romero MRN: [id number] Date of Birth: 1971/08/29 Today's Date: 09/16/2020 Time: SLP Start Time (ACUTE ONLY): 1028 -SLP Stop Time (ACUTE ONLY): 1045 SLP Time Calculation (min) (ACUTE ONLY): 17 min Past Medical History: Past Medical History: Diagnosis Date . A-fib (HCC)  . Chronic headaches  . COPD (chronic obstructive pulmonary disease) (HCC)  . GERD (gastroesophageal reflux disease)  . History of blood transfusion  . Hypertension  . MI (myocardial infarction) (HCC)  . Seizures (HCC)  Past Surgical History: Past Surgical History: Procedure Laterality Date . CHOLECYSTECTOMY   HPI: The pt is a 49 yo male presenting with R-sided weakness and found to have intraparenchymal hematoma in the left basal ganglia on original CT. Repeat imaging revealed increased hemorrhage with 60mm midline shift. PMH includes: GERD, seizures, HTN, COPD, MI, afib, and current tobacco use.  Subjective: alert, not consistently following commands Assessment / Plan / Recommendation CHL IP CLINICAL IMPRESSIONS 09/16/2020 Clinical Impression Pt has significant difficulties with lingual manipulation and transit, spontaneously using a posterior head tilt move boluses back toward his pharynx. As a result, all of the thin liquid from the spoon that is able to reach his pharynx, spills directly into the airway before he can even attempt to swallow. Strong coughing occurs, but he does not clear his airway. The slower the consistencies move, the more time this gives him to work on lingual propulsion and airway closure. When he can coordinate this timing he has much better airway protection, but there is also often more residue in his oral cavity and down through the base of his tongue. Nectar thick liquids are aspirated before the swallow with a similar cough response and honey thick liquids are penetrated, eventually falling to the vocal folds without attempts at  clearance. Purees do not enter the airway but again, leave more residue. Attempted to utilize strategies throughout the study such as a chin tuck, but pt is not following commands enough to carry them out. Recommend that he remain NPO but will initiate some therapeutic boluses within SLP visits. If oral control and clearance improve, he may be able to start a PO diet at least with pureed textures.  SLP Visit Diagnosis Dysphagia, oropharyngeal phase (R13.12) Attention and concentration deficit following -- Frontal lobe and executive function deficit following -- Impact on safety and function Severe aspiration risk   CHL IP TREATMENT RECOMMENDATION 09/16/2020 Treatment Recommendations Therapy as outlined in treatment plan below   Prognosis 09/16/2020 Prognosis for Safe Diet Advancement Good Barriers to Reach Goals Cognitive deficits;Language deficits Barriers/Prognosis Comment -- CHL IP DIET RECOMMENDATION 09/16/2020 SLP Diet Recommendations NPO;Alternative  means - temporary Liquid Administration via -- Medication Administration Via alternative means Compensations -- Postural Changes --   CHL IP OTHER RECOMMENDATIONS 09/16/2020 Recommended Consults -- Oral Care Recommendations Oral care QID Other Recommendations Have oral suction available   CHL IP FOLLOW UP RECOMMENDATIONS 09/16/2020 Follow up Recommendations Inpatient Rehab   CHL IP FREQUENCY AND DURATION 09/16/2020 Speech Therapy Frequency (ACUTE ONLY) min 2x/week Treatment Duration 2 weeks      CHL IP ORAL PHASE 09/16/2020 Oral Phase Impaired Oral - Pudding Teaspoon -- Oral - Pudding Cup -- Oral - Honey Teaspoon Weak lingual manipulation;Reduced posterior propulsion;Lingual/palatal residue;Delayed oral transit;Decreased bolus cohesion Oral - Honey Cup -- Oral - Nectar Teaspoon Weak lingual manipulation;Reduced posterior propulsion;Lingual/palatal residue;Delayed oral transit;Decreased bolus cohesion Oral - Nectar Cup -- Oral - Nectar Straw -- Oral - Thin Teaspoon Weak  lingual manipulation;Reduced posterior propulsion;Lingual/palatal residue;Delayed oral transit;Decreased bolus cohesion Oral - Thin Cup -- Oral - Thin Straw -- Oral - Puree Weak lingual manipulation;Reduced posterior propulsion;Lingual/palatal residue;Delayed oral transit;Decreased bolus cohesion Oral - Mech Soft -- Oral - Regular -- Oral - Multi-Consistency -- Oral - Pill -- Oral Phase - Comment --  CHL IP PHARYNGEAL PHASE 09/16/2020 Pharyngeal Phase Impaired Pharyngeal- Pudding Teaspoon -- Pharyngeal -- Pharyngeal- Pudding Cup -- Pharyngeal -- Pharyngeal- Honey Teaspoon Reduced epiglottic inversion;Reduced anterior laryngeal mobility;Reduced tongue base retraction;Penetration/Aspiration before swallow Pharyngeal Material enters airway, CONTACTS cords and not ejected out Pharyngeal- Honey Cup -- Pharyngeal -- Pharyngeal- Nectar Teaspoon Reduced epiglottic inversion;Reduced anterior laryngeal mobility;Reduced tongue base retraction;Penetration/Aspiration before swallow Pharyngeal Material enters airway, passes BELOW cords and not ejected out despite cough attempt by patient Pharyngeal- Nectar Cup -- Pharyngeal -- Pharyngeal- Nectar Straw -- Pharyngeal -- Pharyngeal- Thin Teaspoon Penetration/Aspiration before swallow Pharyngeal Material enters airway, passes BELOW cords and not ejected out despite cough attempt by patient Pharyngeal- Thin Cup -- Pharyngeal -- Pharyngeal- Thin Straw -- Pharyngeal -- Pharyngeal- Puree Reduced epiglottic inversion;Reduced anterior laryngeal mobility;Reduced tongue base retraction Pharyngeal -- Pharyngeal- Mechanical Soft -- Pharyngeal -- Pharyngeal- Regular -- Pharyngeal -- Pharyngeal- Multi-consistency -- Pharyngeal -- Pharyngeal- Pill -- Pharyngeal -- Pharyngeal Comment --  CHL IP CERVICAL ESOPHAGEAL PHASE 09/16/2020 Cervical Esophageal Phase WFL Pudding Teaspoon -- Pudding Cup -- Honey Teaspoon -- Honey Cup -- Nectar Teaspoon -- Nectar Cup -- Nectar Straw -- Thin Teaspoon -- Thin Cup  -- Thin Straw -- Puree -- Mechanical Soft -- Regular -- Multi-consistency -- Pill -- Cervical Esophageal Comment -- Mahala MenghiniLaura N., M.A. CCC-SLP Acute Rehabilitation Services Pager [phone] Office [phone] 09/16/2020, 2:58 PM               Impression/Recommendations Active Problems:   Stroke, hemorrhagic (HCC)   Hemorrhagic CVA -Patient with a left basal ganglia ICH, thought to be of hypertensive origin -He has significant deficits including R hemiplegia; dysphagia currently requiring tube feeds; and aphasia -No antithrombotic at this time due to ICH -h/o afib, not on AC and currently not a candidate for this treatment due to ICH -Patient will need CIR, consult placed -Had cerebral edema and so started on 3% saline, which is now weaned off; goal Na++ is 150-155, per neurology -Reported h/o seizure d/o but not on home meds and not apparently started on medications during hospitalization to date  HTN -Reportedly noncompliant with home meds -Had been on Cleviprex but this has been stopped -SBP goal is <160 and long-term goal is normotensve -Neurology has ordered BP medications - Labetalol prn and standing Amlodipine 10, Labetalol 300 mg q8h, Lisinopril 20 mg BID, and  hydralazine 100 mg q8h -Currently 128/93   HLD -No statin for now since this can worsen ICH  Afib -Rate controlled with BB -No AC due to ICH -Wife reported no h/o AC prior to event   Tobacco dependence -Heavy smoker, 2ppd  Polysubstance abuse -Reported h/o ETOH dependence -Also with UDS + for amphetamines and marijuana  -Reported heavy h/o ETOH use but should be out of the window for withdrawal a this time  Mood d/o -Reported home use of Wellbutrin and Xanax -Consider restarting these medications if mood dictates   Note: This patient has been tested and is negative for the novel coronavirus COVID-19. She has been fully vaccinated against COVID-19.    DVT prophylaxis:  SCDs Code Status: Full  Family  Communication: None present, seen today by neurology as well Disposition Plan:  The patient is from: home  Anticipated d/c is to: CIR  Patient is currently: acutely ill Consults called: Neurology; CIR; PT/OT/ST/Nutrition; TOC team    Thank you for this consultation.  Our [facility] hospitalist team will assume care of the patient at this time.  Time Spent: 50 minutes  Jonah Blue M.D. [facility] 09/17/2020, 2:05 PM

## 2020-09-17 NOTE — Progress Notes (Addendum)
STROKE TEAM PROGRESS NOTE   INTERVAL HISTORY RN at the bedside. Neuro stable. Hospitalists to see patient, much appreciated.   OBJECTIVE Vitals:   09/17/20 1100 09/17/20 1200 09/17/20 1300 09/17/20 1307  BP:      Pulse: 96 88 93   Resp: (!) 21 15 (!) 22   Temp:    97.9 F (36.6 C)  TempSrc:      SpO2: 99% 97% 96%   Weight:      Height:       CBC:  Recent Labs  Lab 09/11/20 0507 09/12/20 0513 09/15/20 0535 09/16/20 0424  WBC 10.6*   < > 9.3 9.6  NEUTROABS 6.6  --   --   --   HGB 13.5   < > 13.4 14.4  HCT 41.6   < > 41.1 44.2  MCV 86.8   < > 87.3 85.8  PLT 346   < > 291 297   < > = values in this interval not displayed.   Basic Metabolic Panel:  Recent Labs  Lab 09/13/20 0447 09/13/20 0919 09/13/20 1654 09/13/20 2211 09/15/20 0535 09/15/20 0855 09/15/20 2038 09/16/20 0424  NA 147*   < >  --    < > 147*   < > 147* 148*  K 3.1*  --   --    < > 3.7  --   --  3.9  CL 115*  --   --    < > 115*  --   --  117*  CO2 21*  --   --    < > 21*  --   --  19*  GLUCOSE 161*  --   --    < > 113*  --   --  108*  BUN 14  --   --    < > 17  --   --  31*  CREATININE 0.82  --   --    < > 0.83  --   --  0.92  CALCIUM 8.8*  --   --    < > 9.2  --   --  9.2  MG 2.0  --  1.9  --   --   --   --  2.4  PHOS 1.7*  --  3.3  --   --   --   --   --    < > = values in this interval not displayed.   Lipid Panel:     Component Value Date/Time   CHOL 122 09/12/2020 0513   TRIG 104 09/12/2020 0513   TRIG 114 09/12/2020 0513   HDL 40 (L) 09/12/2020 0513   CHOLHDL 3.1 09/12/2020 0513   VLDL 21 09/12/2020 0513   LDLCALC 61 09/12/2020 0513   HgbA1c:  Lab Results  Component Value Date   HGBA1C 5.6 09/12/2020   Urine Drug Screen:     Component Value Date/Time   LABOPIA NONE DETECTED 09/11/2020 0728   COCAINSCRNUR NONE DETECTED 09/11/2020 0728   COCAINSCRNUR NEGATIVE 02/03/2012 2215   LABBENZ NONE DETECTED 09/11/2020 0728   AMPHETMU POSITIVE (A) 09/11/2020 0728   THCU POSITIVE (A)  09/11/2020 0728   LABBARB NONE DETECTED 09/11/2020 0728    Alcohol Level     Component Value Date/Time   ETH <10 09/11/2020 0506    IMAGING  CT HEAD CODE STROKE WO CONTRAST 09/11/2020 1. Intraparenchymal hematoma in the left basal ganglia, measuring 24 mL. 2. No midline shift or hydrocephalus.   CT ANGIO HEAD  W OR WO CONTRAST CT ANGIO NECK W OR WO CONTRAST 09/11/2020 Increase in size of left cerebral hemorrhage and surrounding edema. Mass effect remains mild. No abnormal vascularity in the region of hemorrhage or evidence of active contrast extravasation. No hemodynamically significant stenosis.   CT HEAD WO CONTRAST 09/11/2020 Slightly increased size of left cerebral hemorrhage measuring 6.1 cm, previously 5.6 cm. Minimal intraventricular extension involving the left occipital horn, unchanged. Rightward midline shift of 3 mm and partial left lateral ventricle effacement, unchanged.   CT HEAD WO CONTRAST 09/13/2020 Nonprogressive left cerebral hematoma.No hydrocephalus.   CT HEAD WO CONTRAST 09/15/2020 No substantial change in the left cerebral intraparenchymal hemorrhage.  ECHOCARDIOGRAM COMPLETE 09/11/2020 1. Left ventricular ejection fraction, by estimation, is 50 to 55%. The left ventricle has low normal function. The left ventricle has no regional wall motion abnormalities. There is mild left ventricular hypertrophy. Left ventricular diastolic parameters are consistent with Grade II diastolic dysfunction (pseudonormalization).   2. Right ventricular systolic function is normal. The right ventricular size is normal.  3 3. Left atrial size was mildly dilated.   4. The mitral valve is normal in structure. Mild to moderate mitral valve regurgitation. No evidence of mitral stenosis.   5. The aortic valve is normal in structure. Aortic valve regurgitation is not visualized. No aortic stenosis is present.   6. The inferior vena cava is normal in size with greater than 50%  respiratory variability, suggesting right atrial pressure of 3 mmHg.   DG CHEST PORT 1 VIEW 09/12/2020 1. No radiographic evidence of acute cardiopulmonary disease. 2. Cardiomegaly.     PHYSICAL EXAM   Temp:  [97.8 F (36.6 C)-98.7 F (37.1 C)] 97.9 F (36.6 C) (11/06 1307) Pulse Rate:  [46-96] 93 (11/06 1300) Resp:  [12-27] 22 (11/06 1300) BP: (114-179)/(80-114) 129/93 (11/06 0947) SpO2:  [95 %-100 %] 96 % (11/06 1300)  General - Well nourished, well developed, not in acute distress.  Ophthalmologic - fundi not visualized due to noncooperation.  Cardiovascular - irregularly irregular heart rate and rhythm.  Neuro - eyes open spontaneously.  Expressive aphasia, making sounds but largely intangible, not able to name or repeat. However, he was able to follow simple commands on the left hand and foot. Eyes left gaze preference, able to cross midline but incomplete with right gaze. Blinking to visual threat on the left but not on the right. PERRL. Right facial droop. Right UE slight extension on pain stimulation. Right LE slight withdraw to pain stimulation. Spontaneous moving LUE and LLE against gravity. Sensation, coordination not cooperative and gait not tested.   ASSESSMENT/PLAN [name] is a 49 y.o. male with history of A. fib not on AC, seizure, MI, hypertension, smoker, OSA presenting with right-sided weakness, right facial droop, slurred speech. He did not receive IV t-PA due to ICH.   ICH - left BG ICH, likely hypertensive etiology  CT Head - left BG ICH  CTA H&N increase L BG ICH & edema. Vasculature unremarkable.  CT head repeat slight increase L BG ICH, minimal IVH. R shift 32mm, partial L ventricle effacement.  CT repeat 11/2 stable ICH and midline shift  CT repeat 11/4 - No substantial change in the left ICH.  2D Echo - EF 50-55%. Mild LVH. No source of embolus.  Sars Corona Virus 2 negative  LDL - 61  HgbA1c - 5.6  UDS positive for THC and  amphetamine  VTE prophylaxis -SCDs  aspirin 81 mg daily prior to admission, now  on No antithrombotic due to ICH. Will need follow up with neurology 4 weeks for repeat imaging. May consider starting AC(has afib) vs ASA when hemorrhage resolved.Needs follow up with cardiology.  Therapy recommendations:  CIR  Disposition:  Pending  Cerebral Edema  Increasing edema w/ midline shift per CT  CT repeat 11/2 stable ICH  CT repeat 11/4 stable ICH  On 3% saline at 50cc/hr -> 25cc->off  Goal 150-155   Na 150->147->148  Hypertensive emergency  Home BP meds: Not compliant with medication  Now off Cleviprex  Stable on the high end  Labetalol IV PRN  On amlodipine 10, labetolol 100->200->300 q8, lisinopril 20 twice daily  Add hydralazine 50->75->100 Q8 . SBP goal <160 . Long-term BP goal normotensive  Chronic afib  Per wife, patient has chronic A. fib not on stronger blood thinners  Lost follow-up with cardiology as outpatient  Currently still in A. fib but rate controlled  No antithrombotic at this time due to ICH  Tobacco abuse  Current smoker, 2PPD  Smoking cessation counseling will be provided  Dysphagia  NPO  Did not pass swallow  Cortrak placement w/ TF @ 60   MBSS - NPO  Speech on board  Leukocytosis, resolved   WBC 10.6->14.4->13.8->10.9->9.3->9.6 - likely reactive.   CXR NAD   UA neg   CBC monitoring  Other Stroke Risk Factors  ETOH use, advised to drink no more than 1-2 drink(s) a day  Obesity, Body mass index is 32.65 kg/m., recommend weight loss, diet and exercise as appropriate   Coronary artery disease/MI  Obstructive sleep apnea  Substance Abuse - THC and amphetamine positive on UDS.  Wife admitted patient using THC, however not knowing about amphetamine  Other Active Problems  Code status - full code  Hx seizures    Hypokalemia K 2.8->3.2->3.1->3.3 - supplement ->3.7->3.9  Hypophosphatemia Phos 1.7 - supplement  ->3.3  Hospital day # 6  Stroke will sign off, [facility] hospitalist team to assume care.Much appreciate our colleagues.   Will need follow up with neurology 4 weeks for repeat imaging. May consider starting AC(has afib) vs ASA when hemorrhage resolved. Needs follow up with cardiology. Stroke team will sign off when Hospitalist team becomes primary.  Personally examined patient and images, and have participated in and made any corrections needed to history, physical, neuro exam,assessment and plan as stated above.  I have personally obtained the history, evaluated lab date, reviewed imaging studies and agree with radiology interpretations.    Naomie Dean, MD Stroke Neurology  I spent 25 minutes of face-to-face and non-face-to-face time with patient. This included prechart review, lab review, study review, order entry, electronic health record documentation, patient education on the different diagnostic and therapeutic options, counseling and coordination of care, risks and benefits of management, compliance, or risk factor reduction     To contact Stroke Continuity provider, please refer to [email or web address]. After hours, contact General Neurology

## 2020-09-18 DIAGNOSIS — I69391 Dysphagia following cerebral infarction: Secondary | ICD-10-CM

## 2020-09-18 LAB — GLUCOSE, CAPILLARY
Glucose-Capillary: 131 mg/dL — ABNORMAL HIGH (ref 70–99)
Glucose-Capillary: 138 mg/dL — ABNORMAL HIGH (ref 70–99)
Glucose-Capillary: 139 mg/dL — ABNORMAL HIGH (ref 70–99)
Glucose-Capillary: 146 mg/dL — ABNORMAL HIGH (ref 70–99)
Glucose-Capillary: 152 mg/dL — ABNORMAL HIGH (ref 70–99)
Glucose-Capillary: 99 mg/dL (ref 70–99)

## 2020-09-18 NOTE — Progress Notes (Signed)
Inpatient Rehabilitation Admissions Coordinator  Inpatient rehab consult received, consult on 11/2 and following for medical readiness to admit to CIR. I will follow up on Monday 11/8. I notified Dr. Pola Corn that rehab consult completed 11/2 by Dr. Berline Chough.  Ottie Glazier, RN, MSN Rehab Admissions Coordinator [phone] 09/18/2020 8:55 AM

## 2020-09-18 NOTE — Progress Notes (Signed)
PROGRESS NOTE  Jason Romero  DOB: 02-28-1971  PCP: Patient, No Pcp Per [id number]  DOA: 09/11/2020  LOS: 7 days   Chief Complaint  Patient presents with  . Code Stroke   Brief narrative: Jason Romero is a 49 y.o. male who presented to the ED on 09/11/2020 with right-sided weakness and facial droop. PMH significant for HTN, CAD, A. fib not on anticoagulation, seizures, COPD/tobacco abuse, poor follow-up.  In the ED, patient was noted to be hypertensive to 195/127.  He had right hemiplegia, aphasia and dysphagia. Imaging showed intraparenchymal hematoma in the left basal ganglia measuring 5.6 cm size He was admitted to neuro ICU.  11/6 -clinically stabilized and transferred out of ICU to floors under hospitalist service.    He is stable, on 4 HTN meds with persistent mild HTN.  NPO on Cortrak, pending CIR but not seen by them yet (PT has recommended).  Subjective: Patient was seen and examined this morning. Middle-aged Caucasian male.  Looks older for his age.  Wife at bedside. Patient has a core track in place. He is alert, awake, seems to have expressive aphasia.  Inconsistently follows motor command.  Assessment/Plan: Left basal ganglia intracranial hemorrhage Cerebral edema with midline shift -Intra-parenchymal hemorrhage was likely secondary to uncontrolled hypertension -Initial few CT scans of head showed worsening of the size of hematoma with surrounding edema.  Patient was monitored in ICU and also briefly given 3% normal saline. Last scan from 11/4 showed stable ICH with mild midline shift. -Echocardiogram with EF 50 to 50%, mild LVH, no source of embolus. -LDL 61, A1c 5.6 -UDS was positive for THC and amphetamine. -Prior to admission, patient was on aspirin 81 mg daily.  Because of ICH, he is currently not on any antiplatelet or anticoagulant. -Plan is to follow-up with neurology in 4 weeks for repeat imaging.  Once hemorrhage resolves, patient may be  considered for anticoagulation because of coexisting A. Fib -Current deficits: Dysphagia, aphasia, right hemiplegia, inconsistently follows motor command -PT recommended CIR.  Hypertensive emergency History of essential hypertension -Reported noncompliance to medications at home. -In the ICU, patient required Cleviprex drip. -Currently on oral blood pressure medicines including amlodipine 10 mg daily, labetalol 200 mg every 8 hours, lisinopril 20 mg daily, hydralazine 100 mg every 8 hours. -Continue labetalol IV as needed. -Short-term blood pressure goal systolic less than 160.  Long-term blood pressure goal less than 140.  Chronic afib -Remains rate controlled.   -Was not on blood thinners at home.  No antithrombotic or anticoagulant at this time because of ICH.   Dysphagia -Secondary to stroke. -Currently has a core track feeding with tube feeding at 60 ml/h. -We will discuss with speech therapy.  May need PEG tube placement for long-term nutrition.  Low electrolytes -Trend as below.  Repeat labs tomorrow Recent Labs  Lab 09/12/20 0513 09/12/20 1205 09/12/20 1850 09/13/20 0447 09/13/20 1654 09/14/20 0232 09/15/20 0535 09/16/20 0424  K 3.5  --   --  3.1*  --  3.3* 3.7 3.9  MG  --  1.9 2.0 2.0 1.9  --   --  2.4  PHOS  --  2.3* 1.7* 1.7* 3.3  --   --   --    COPD Current everyday smoker -Was smoking 2 pack/day -Counseled to quit.  Polysubstance abuse -UDS on admission was positive for THC and amphetamine.   -Counseled to quit   History of seizures -Not on antiepileptics.  Mobility: Working with PT. Code Status:  Code Status: Full Code  Nutritional status: Body mass index is 30.81 kg/m. Nutrition Problem: Inadequate oral intake Etiology: acute illness Signs/Symptoms: NPO status Diet Order            Diet NPO time specified  Diet effective now                 DVT prophylaxis: SCD's Start: 09/11/20 0737   Antimicrobials:  None Fluid:  None Consultants: Neurology Family Communication:  Wife at bedside  Status is: Inpatient  Remains inpatient appropriate because -ongoing dysphagia, core track feeding, may need PEG tube placement  Dispo: The patient is from: Home              Anticipated d/c is to: CIR versus SNF              Anticipated d/c date is: > 3 days              Patient currently is not medically stable to d/c.       Infusions:  . feeding supplement (OSMOLITE 1.5 CAL) 1,000 mL (09/16/20 0410)    Scheduled Meds: . amLODipine  10 mg Per Tube Daily  . chlorhexidine  15 mL Mouth Rinse BID  . Chlorhexidine Gluconate Cloth  6 each Topical Daily  . feeding supplement (PROSource TF)  45 mL Per Tube TID  . hydrALAZINE  100 mg Per Tube Q8H  . labetalol  300 mg Per Tube Q8H  . lisinopril  20 mg Per Tube BID  . mouth rinse  15 mL Mouth Rinse q12n4p  . pantoprazole sodium  40 mg Per Tube Daily  . sennosides  5 mL Per Tube BID    Antimicrobials: Anti-infectives (From admission, onward)   None      PRN meds: acetaminophen **OR** acetaminophen (TYLENOL) oral liquid 160 mg/5 mL **OR** acetaminophen, labetalol   Objective: Vitals:   09/17/20 2333 09/18/20 0453  BP: 108/76 (!) 142/103  Pulse: 82 94  Resp: 17 18  Temp: 98.4 F (36.9 C) 98.8 F (37.1 C)  SpO2: 97% 97%    Intake/Output Summary (Last 24 hours) at 09/18/2020 0839 Last data filed at 09/17/2020 1800 Gross per 24 hour  Intake 620 ml  Output 350 ml  Net 270 ml   Filed Weights   09/11/20 0500 09/11/20 0721 09/18/20 0453  Weight: 113.8 kg 113.8 kg 107.4 kg   Weight change:  Body mass index is 30.81 kg/m.   Physical Exam: General exam: Appears calm and comfortable.  Not in physical distress Skin: No rashes, lesions or ulcers. HEENT: Atraumatic, normocephalic, core track feeding Lungs: Clear to auscultate bilaterally CVS: Regular rate and rhythm, no murmur GI/Abd soft, nontender, nondistended, bowel sound present CNS: Alert,  awake, dense right hemiplegia, mumbles few words, inconsistently follows motor commands, Psychiatry: Depressed look Extremities: No pedal edema, no calf tenderness  Data Review: I have personally reviewed the laboratory data and studies available.  Recent Labs  Lab 09/12/20 0513 09/13/20 0447 09/14/20 0232 09/15/20 0535 09/16/20 0424  WBC 14.4* 13.8* 10.9* 9.3 9.6  HGB 12.9* 12.9* 12.4* 13.4 14.4  HCT 39.5 39.4 38.5* 41.1 44.2  MCV 86.6 87.0 87.5 87.3 85.8  PLT 353 330 317 291 297   Recent Labs  Lab 09/12/20 0513 09/12/20 0919 09/12/20 1205 09/12/20 1504 09/12/20 1850 09/12/20 2039 09/13/20 0447 09/13/20 0919 09/13/20 1654 09/13/20 2211 09/14/20 0232 09/14/20 0909 09/15/20 0535 09/15/20 0855 09/15/20 1514 09/15/20 2038 09/16/20 0424  NA 140   < >  --    < >  --    < >  147*   < >  --    < > 148*   < > 147* 148* 150* 147* 148*  K 3.5  --   --   --   --   --  3.1*  --   --   --  3.3*  --  3.7  --   --   --  3.9  CL 109  --   --   --   --   --  115*  --   --   --  115*  --  115*  --   --   --  117*  CO2 19*  --   --   --   --   --  21*  --   --   --  21*  --  21*  --   --   --  19*  GLUCOSE 116*  --   --   --   --   --  161*  --   --   --  126*  --  113*  --   --   --  108*  BUN 10  --   --   --   --   --  14  --   --   --  10  --  17  --   --   --  31*  CREATININE 0.93  --   --   --   --   --  0.82  --   --   --  0.76  --  0.83  --   --   --  0.92  CALCIUM 8.6*  --   --   --   --   --  8.8*  --   --   --  8.7*  --  9.2  --   --   --  9.2  MG  --   --  1.9  --  2.0  --  2.0  --  1.9  --   --   --   --   --   --   --  2.4  PHOS  --   --  2.3*  --  1.7*  --  1.7*  --  3.3  --   --   --   --   --   --   --   --    < > = values in this interval not displayed.    F/u labs ordered  Signed, Lorin Glass, MD [facility] 09/18/2020

## 2020-09-19 LAB — COMPREHENSIVE METABOLIC PANEL
ALT: 55 U/L — ABNORMAL HIGH (ref 0–44)
AST: 46 U/L — ABNORMAL HIGH (ref 15–41)
Albumin: 3.2 g/dL — ABNORMAL LOW (ref 3.5–5.0)
Alkaline Phosphatase: 62 U/L (ref 38–126)
Anion gap: 10 (ref 5–15)
BUN: 46 mg/dL — ABNORMAL HIGH (ref 6–20)
CO2: 21 mmol/L — ABNORMAL LOW (ref 22–32)
Calcium: 8.9 mg/dL (ref 8.9–10.3)
Chloride: 118 mmol/L — ABNORMAL HIGH (ref 98–111)
Creatinine, Ser: 1.09 mg/dL (ref 0.61–1.24)
GFR, Estimated: >60 mL/min (ref >60)
Glucose, Bld: 158 mg/dL — ABNORMAL HIGH (ref 70–99)
Potassium: 3.7 mmol/L (ref 3.5–5.1)
Sodium: 149 mmol/L — ABNORMAL HIGH (ref 135–145)
Total Bilirubin: 0.6 mg/dL (ref 0.3–1.2)
Total Protein: 7 g/dL (ref 6.5–8.1)

## 2020-09-19 LAB — CBC WITH DIFFERENTIAL/PLATELET
Abs Immature Granulocytes: 0.05 10*3/uL (ref 0.00–0.07)
Basophils Absolute: 0 10*3/uL (ref 0.0–0.1)
Basophils Relative: 1 %
Eosinophils Absolute: 0.1 10*3/uL (ref 0.0–0.5)
Eosinophils Relative: 2 %
HCT: 43.4 % (ref 39.0–52.0)
Hemoglobin: 13.7 g/dL (ref 13.0–17.0)
Immature Granulocytes: 1 %
Lymphocytes Relative: 18 %
Lymphs Abs: 1.5 10*3/uL (ref 0.7–4.0)
MCH: 27.8 pg (ref 26.0–34.0)
MCHC: 31.6 g/dL (ref 30.0–36.0)
MCV: 88 fL (ref 80.0–100.0)
Monocytes Absolute: 0.8 10*3/uL (ref 0.1–1.0)
Monocytes Relative: 9 %
Neutro Abs: 5.9 10*3/uL (ref 1.7–7.7)
Neutrophils Relative %: 69 %
Platelets: 269 10*3/uL (ref 150–400)
RBC: 4.93 MIL/uL (ref 4.22–5.81)
RDW: 15.5 % (ref 11.5–15.5)
WBC: 8.3 10*3/uL (ref 4.0–10.5)
nRBC: 0 % (ref 0.0–0.2)

## 2020-09-19 LAB — GLUCOSE, CAPILLARY
Glucose-Capillary: 104 mg/dL — ABNORMAL HIGH (ref 70–99)
Glucose-Capillary: 131 mg/dL — ABNORMAL HIGH (ref 70–99)
Glucose-Capillary: 134 mg/dL — ABNORMAL HIGH (ref 70–99)
Glucose-Capillary: 137 mg/dL — ABNORMAL HIGH (ref 70–99)
Glucose-Capillary: 137 mg/dL — ABNORMAL HIGH (ref 70–99)
Glucose-Capillary: 137 mg/dL — ABNORMAL HIGH (ref 70–99)
Glucose-Capillary: 157 mg/dL — ABNORMAL HIGH (ref 70–99)

## 2020-09-19 LAB — PHOSPHORUS: Phosphorus: 4.2 mg/dL (ref 2.5–4.6)

## 2020-09-19 LAB — MAGNESIUM: Magnesium: 2.3 mg/dL (ref 1.7–2.4)

## 2020-09-19 MED ORDER — FREE WATER
250.0000 mL | Freq: Four times a day (QID) | Status: DC
  Administered 2020-09-19 – 2020-09-20 (×5): 250 mL

## 2020-09-19 NOTE — Progress Notes (Signed)
Inpatient Rehabilitation Admissions Coordinator  I met at bedside with patient an his wife to update on rehab admit status. I contacted Dr. Sophronia Simas this morning to clarify when felt medically ready to d/c to CIR. I will follow up on Tuesday, per Dr. Judie Bonus request that patient is Hypernatremic today. We can take patient with Cortrak, not in need of PEG placement prior to CIR admit.  Danne Baxter, RN, MSN Rehab Admissions Coordinator [phone] 09/19/2020 11:25 AM

## 2020-09-19 NOTE — Progress Notes (Signed)
PROGRESS NOTE  Jason Romero  DOB: 02/05/1971  PCP: Patient, No Pcp Per [id number]  DOA: 09/11/2020  LOS: 8 days   Chief Complaint  Patient presents with  . Code Stroke   Brief narrative: Jason Romero is a 49 y.o. male who presented to the ED on 09/11/2020 with right-sided weakness and facial droop. PMH significant for HTN, CAD, A. fib not on anticoagulation, seizures, COPD/tobacco abuse, poor follow-up.  In the ED, patient was noted to be hypertensive to 195/127.  He had right hemiplegia, aphasia and dysphagia. Imaging showed intraparenchymal hematoma in the left basal ganglia measuring 5.6 cm size He was admitted to neuro ICU, treated with Cleviprex drip, 3% normal saline, subsequent repeat CT scan showed stability. 11/6 -transferred out of ICU to floors under hospitalist service.    Currently he is stable, on 4 HTN meds with persistent mild HTN.  NPO on Cortrak.  Subjective: Patient was seen and examined this morning. Not in physical distress but starts getting anxious when talking about his deficits. He seems to understand well but has significant expressive aphasia. Patient has a core track in place.  Assessment/Plan: Left basal ganglia intracranial hemorrhage Cerebral edema with midline shift -Intra-parenchymal hemorrhage was likely secondary to uncontrolled hypertension -Initial few CT scans of head showed worsening of the size of hematoma with surrounding edema.  Patient was monitored in ICU and also briefly given 3% normal saline. Last scan from 11/4 showed stable ICH with mild midline shift. -Echocardiogram with EF 50 to 50%, mild LVH, no source of embolus. -LDL 61, A1c 5.6 -UDS was positive for THC and amphetamine. -Prior to admission, patient was on aspirin 81 mg daily.  Because of ICH, he is currently not on any antiplatelet or anticoagulant. -Plan is to follow-up with neurology in 4 weeks for repeat imaging.  Once hemorrhage resolves, patient may be  considered for anticoagulation because of coexisting A. Fib -Current deficits: Dysphagia, aphasia, right hemiplegia, inconsistently follows motor command -PT recommended CIR.  Hypertensive emergency History of essential hypertension -Reported noncompliance to medications at home. -In the ICU, patient required Cleviprex drip. -Currently on oral blood pressure medicines including amlodipine 10 mg daily, labetalol 200 mg every 8 hours, lisinopril 20 mg daily, hydralazine 100 mg every 8 hours. -Continue labetalol IV as needed. -Short-term blood pressure goal systolic less than 160.  Long-term blood pressure goal less than 140.  Hypernatremia -Apparently, sodium level has remained elevated throughout his stay.  In the ICU, he was getting 3% normal saline.  Currently off it.  However sodium level remains up at 149.  Will start on free water via core track 250 mL every 6 hours.  If starts to improve, will discharge him to CIR tomorrow. Recent Labs  Lab 09/14/20 0232 09/14/20 0909 09/14/20 1441 09/14/20 2100 09/15/20 0535 09/15/20 0855 09/15/20 1514 09/15/20 2038 09/16/20 0424 09/19/20 0142  NA 148* 146* 149* 149* 147* 148* 150* 147* 148* 149*   Chronic afib -Remains rate controlled.   -Was not on blood thinners at home.  No antithrombotic or anticoagulant at this time because of ICH.   Dysphagia -Secondary to stroke. -Currently has a core track feeding with tube feeding at 60 ml/h. -Needs to follow-up with speech therapy.  May need PEG tube placement for long-term nutrition.  Low electrolytes -Trend as below.  Repeat labs tomorrow Recent Labs  Lab 09/12/20 1205 09/12/20 1205 09/12/20 1850 09/13/20 0447 09/13/20 1654 09/14/20 0232 09/15/20 0535 09/16/20 0424 09/19/20 0142  K  --   --   --  3.1*  --  3.3* 3.7 3.9 3.7  MG 1.9   < > 2.0 2.0 1.9  --   --  2.4 2.3  PHOS 2.3*  --  1.7* 1.7* 3.3  --   --   --  4.2   < > = values in this interval not displayed.    COPD Current everyday smoker -Was smoking 2 pack/day -Counseled to quit.  Polysubstance abuse -UDS on admission was positive for THC and amphetamine.   -Counseled to quit   History of seizures -Not on antiepileptics.  Mobility: Working with PT. Code Status:   Code Status: Full Code  Nutritional status: Body mass index is 30.67 kg/m. Nutrition Problem: Inadequate oral intake Etiology: acute illness Signs/Symptoms: NPO status Diet Order            Diet NPO time specified  Diet effective now                 DVT prophylaxis: SCD's Start: 09/11/20 0737   Antimicrobials:  None Fluid: None Consultants: Neurology Family Communication:  Family not at bedside today  Status is: Inpatient  Remains inpatient appropriate because -elevated sodium level Dispo: The patient is from: Home              Anticipated d/c is to: CIR               Anticipated d/c date is: Discharge to CIR tomorrow if sodium level shows improvement.              Patient currently is not medically stable to d/c.       Infusions:  . feeding supplement (OSMOLITE 1.5 CAL) 1,000 mL (09/18/20 1853)    Scheduled Meds: . amLODipine  10 mg Per Tube Daily  . chlorhexidine  15 mL Mouth Rinse BID  . Chlorhexidine Gluconate Cloth  6 each Topical Daily  . feeding supplement (PROSource TF)  45 mL Per Tube TID  . free water  250 mL Per Tube Q6H  . hydrALAZINE  100 mg Per Tube Q8H  . labetalol  300 mg Per Tube Q8H  . lisinopril  20 mg Per Tube BID  . mouth rinse  15 mL Mouth Rinse q12n4p  . pantoprazole sodium  40 mg Per Tube Daily  . sennosides  5 mL Per Tube BID    Antimicrobials: Anti-infectives (From admission, onward)   None      PRN meds: acetaminophen **OR** acetaminophen (TYLENOL) oral liquid 160 mg/5 mL **OR** acetaminophen, labetalol   Objective: Vitals:   09/19/20 0500 09/19/20 0744  BP: 123/74 133/80  Pulse: 81 86  Resp: 18 18  Temp: 98.2 F (36.8 C) 98.2 F (36.8 C)  SpO2:  99% 97%   No intake or output data in the 24 hours ending 09/19/20 1051 Filed Weights   09/11/20 0721 09/18/20 0453 09/19/20 0500  Weight: 113.8 kg 107.4 kg 106.9 kg   Weight change: -0.5 kg Body mass index is 30.67 kg/m.   Physical Exam: General exam: Appears calm and comfortable.  Not in physical distress Skin: No rashes, lesions or ulcers. HEENT: Atraumatic, normocephalic, core track feeding Lungs: Clear to auscultation bilaterally CVS: Regular rate and rhythm, no murmur GI/Abd soft, nontender, nondistended, bowel sound present CNS: Alert, awake, able to tell me his name.  Strong expressive aphasia, dense right hemiplegia, inconsistently follows motor commands, Psychiatry: Depressed look Extremities: No pedal edema, no calf tenderness  Data Review: I have personally reviewed the laboratory data and studies available.  Recent Labs  Lab 09/13/20 0447 09/14/20 0232 09/15/20 0535 09/16/20 0424 09/19/20 0142  WBC 13.8* 10.9* 9.3 9.6 8.3  NEUTROABS  --   --   --   --  5.9  HGB 12.9* 12.4* 13.4 14.4 13.7  HCT 39.4 38.5* 41.1 44.2 43.4  MCV 87.0 87.5 87.3 85.8 88.0  PLT 330 317 291 297 269   Recent Labs  Lab 09/12/20 1205 09/12/20 1504 09/12/20 1850 09/12/20 2039 09/13/20 0447 09/13/20 0919 09/13/20 1654 09/13/20 2211 09/14/20 0232 09/14/20 0909 09/15/20 0535 09/15/20 0535 09/15/20 0855 09/15/20 1514 09/15/20 2038 09/16/20 0424 09/19/20 0142  NA  --    < >  --    < > 147*   < >  --    < > 148*   < > 147*   < > 148* 150* 147* 148* 149*  K  --   --   --   --  3.1*  --   --   --  3.3*  --  3.7  --   --   --   --  3.9 3.7  CL  --   --   --   --  115*  --   --   --  115*  --  115*  --   --   --   --  117* 118*  CO2  --   --   --   --  21*  --   --   --  21*  --  21*  --   --   --   --  19* 21*  GLUCOSE  --   --   --   --  161*  --   --   --  126*  --  113*  --   --   --   --  108* 158*  BUN  --   --   --   --  14  --   --   --  10  --  17  --   --   --   --  31*  46*  CREATININE  --   --   --   --  0.82  --   --   --  0.76  --  0.83  --   --   --   --  0.92 1.09  CALCIUM  --   --   --   --  8.8*  --   --   --  8.7*  --  9.2  --   --   --   --  9.2 8.9  MG 1.9   < > 2.0  --  2.0  --  1.9  --   --   --   --   --   --   --   --  2.4 2.3  PHOS 2.3*  --  1.7*  --  1.7*  --  3.3  --   --   --   --   --   --   --   --   --  4.2   < > = values in this interval not displayed.    F/u labs ordered  Signed, Lorin Glass, MD [facility] 09/19/2020

## 2020-09-19 NOTE — Progress Notes (Signed)
  Speech Language Pathology Treatment: Dysphagia;Cognitive-Linquistic  Patient Details Name: Jason Romero MRN: [id number] DOB: 1971/04/10 Today's Date: 09/19/2020 Time: 9678-9381 SLP Time Calculation (min) (ACUTE ONLY): 16 min  Assessment / Plan / Recommendation Clinical Impression  Pt was seen for ongoing cognitive-linguistic and swallowing therapy. SLP administered therapeutic trials of puree with improved oral clearance compared to MBS. After consuming approximately 2-3 ounces of the container, pt started to have throat clearing and delayed coughing, so trials were ceased. Given his potential to at least begin a modified diet with intensive SLP follow up, would recommend continuing temporary alternative means of nutrition to allow him time to work more on dysphagia at CIR level.   Pt's receptive language seems to be improving as does his awareness. He becomes very frustrated with his communication difficulties. Attempted playing familiar music, with pt acknowledging his music preferences via yes/no responses. Although his preferences at baseline are unknown, he was very consistent in his responses today, suggestive of accuracy. Pt did not produce any of the words in the songs but tapped along to the music with demonstration from SLP. SLP also attempted counting to the music, but pt was not able to verbalize with automatic tasks either. He did produce a clear, spontaneous utterance at the short phrase level when feeling more emotional. Encouragement and education were provided. Pt remains a good candidate for CIR to work on communication as well.   HPI HPI: The pt is a 49 yo male presenting with R-sided weakness and found to have intraparenchymal hematoma in the left basal ganglia on original CT. Repeat imaging revealed increased hemorrhage with 46mm midline shift. PMH includes: GERD, seizures, HTN, COPD, MI, afib, and current tobacco use.       SLP Plan  Continue with current plan of care        Recommendations  Diet recommendations: NPO Medication Administration: Via alternative means                Oral Care Recommendations: Oral care QID Follow up Recommendations: Inpatient Rehab SLP Visit Diagnosis: Dysphagia, oropharyngeal phase (R13.12);Aphasia (R47.01) Plan: Continue with current plan of care       GO                Mahala Menghini., M.A. CCC-SLP Acute Rehabilitation Services Pager [phone] Office [phone]  09/19/2020, 3:23 PM

## 2020-09-19 NOTE — Progress Notes (Signed)
Physical Therapy Treatment Patient Details Name: YOUSUF Romero MRN: [id number] DOB: 06/28/1971 Today's Date: 09/19/2020    History of Present Illness 49 yo male admitted to [facility] on 10/31 after being found by wife slumped in bathroom with R deficits. CT head reveals intraparenchymal hematoma in L BG, repeat head CT shows increased cerebral hemorrhage with R midline shift 3 mm. PMH includes afib, COPD, GERD, HTN, MI.    PT Comments    Focused session on finding/maintaining midline in sitting and standing this date. Attempted to utilize a mirror placed anterior to the pt to facilitate increased awareness to position and R side, but pt would tend to avoid looking in mirror even when cued. Pt displayed decreased pushing in sitting compared to previous sessions, but continues to display impulsive tendencies that place him at risk for falls. He demonstrated a strong push to the R when standing in the stedy, resulting in him requiring modAx2 to maxA to maintain his balance and safety, minimally correcting himself when cued. Will continue to follow acutely and recommend CIR upon d/c as he could benefit from intensive therapy to address his deficits and maximize his independence and safety with all functional mobility.   Follow Up Recommendations  CIR;Supervision/Assistance - 24 hour     Equipment Recommendations  Other (comment) (TBD)    Recommendations for Other Services       Precautions / Restrictions Precautions Precautions: Fall Precaution Comments: R hemiplegia, R inattention, contraversive pushing Restrictions Weight Bearing Restrictions: No    Mobility  Bed Mobility Overal bed mobility: Needs Assistance Bed Mobility: Supine to Sit;Sit to Supine     Supine to sit: Mod assist Sit to supine: Mod assist   General bed mobility comments: Transitioned supine > sit L EOB, cuing pt to manage R LE towards EOB with min activation noted. Pt impulsive to attempt to ascend trunk while  neglecting R side, cuing pt to remember to bring R UE with body for safety. ModA to maintain safety due to impulsive movements.  Transfers Overall transfer level: Needs assistance Equipment used: Ambulation equipment used Transfers: Sit to/from Stand Sit to Stand: Mod assist;+2 physical assistance;+2 safety/equipment         General transfer comment: Hand-over-hand assistance to place R hand on blure bar of stedy, cuing pt on sequence of steps to come to stand safely. Pt demonstrated strong R lateral lean upon coming to stand, requiring modAx2 to maintain balance and cue pt to find midline. Attempted mirror anterior to pt to encourage midline alignment, but pt tends to avoid looking in mirror despite cues.  Ambulation/Gait                 Stairs             Wheelchair Mobility    Modified Rankin (Stroke Patients Only) Modified Rankin (Stroke Patients Only) Pre-Morbid Rankin Score: No symptoms Modified Rankin: Severe disability     Balance Overall balance assessment: Needs assistance Sitting-balance support: Feet supported;Bilateral upper extremity supported Sitting balance-Leahy Scale: Poor Sitting balance - Comments: Pt impulsive and tends to lean to the L or lose balance anteriorly when cued to sit upright. Attempted placing mirror anterior to pt but pt would avoid looking in mirror when cued. Cued pt to place weight through R UE and to maintain midline through placing B elbows on B thighs.  Postural control: Left lateral lean Standing balance support: Bilateral upper extremity supported Standing balance-Leahy Scale: Poor Standing balance comment: ModAx2 with intermittent maxA to  avoid LOB with pt standing in stedy x2 bouts of ~30 sec each. Demonstrated strong R lateral lean, requiring cues and mirror anterior to pt to attempt to find midline, with min success.                            Cognition Arousal/Alertness: Awake/alert Behavior During  Therapy: Restless;Impulsive Overall Cognitive Status: Difficult to assess Area of Impairment: Following commands;Safety/judgement;Awareness;Problem solving                       Following Commands: Follows one step commands with increased time Safety/Judgement: Decreased awareness of safety Awareness: Emergent Problem Solving: Difficulty sequencing;Requires verbal cues;Requires tactile cues General Comments: Impulsive movements, requiring cues and assistance to maintain safety. Pt unable to state name but able to identify his name from a list. Continues to neglect R side with movement, requiring cues to remain attentive to R side.       Exercises      General Comments        Pertinent Vitals/Pain Pain Assessment: No/denies pain Pain Intervention(s): Limited activity within patient's tolerance;Monitored during session    Home Living                      Prior Function            PT Goals (current goals can now be found in the care plan section) Acute Rehab PT Goals Patient Stated Goal: none explicitly stated PT Goal Formulation: With patient Time For Goal Achievement: 09/27/20 Potential to Achieve Goals: Good Progress towards PT goals: Progressing toward goals    Frequency    Min 4X/week      PT Plan Current plan remains appropriate    Co-evaluation PT/OT/SLP Co-Evaluation/Treatment: Yes Reason for Co-Treatment: Complexity of the patient's impairments (multi-system involvement);For patient/therapist safety;To address functional/ADL transfers;Necessary to address cognition/behavior during functional activity PT goals addressed during session: Mobility/safety with mobility;Balance OT goals addressed during session: ADL's and self-care      AM-PAC PT "6 Clicks" Mobility   Outcome Measure  Help needed turning from your back to your side while in a flat bed without using bedrails?: A Lot Help needed moving from lying on your back to sitting on  the side of a flat bed without using bedrails?: A Lot Help needed moving to and from a bed to a chair (including a wheelchair)?: A Lot Help needed standing up from a chair using your arms (e.g., wheelchair or bedside chair)?: A Lot Help needed to walk in hospital room?: Total Help needed climbing 3-5 steps with a railing? : Total 6 Click Score: 10    End of Session Equipment Utilized During Treatment: Gait belt Activity Tolerance: Patient limited by fatigue Patient left: in chair;with call bell/phone within reach;with chair alarm set Nurse Communication: Mobility status;Need for lift equipment PT Visit Diagnosis: Hemiplegia and hemiparesis;Muscle weakness (generalized) (M62.81);Unsteadiness on feet (R26.81);Difficulty in walking, not elsewhere classified (R26.2);Other symptoms and signs involving the nervous system (R29.898) Hemiplegia - Right/Left: Right Hemiplegia - dominant/non-dominant: Non-dominant Hemiplegia - caused by: Nontraumatic intracerebral hemorrhage     Time: 0347-4259 PT Time Calculation (min) (ACUTE ONLY): 24 min  Charges:  $Therapeutic Activity: 8-22 mins                     Raymond Gurney, PT, DPT Acute Rehabilitation Services  Pager: [phone] Office: [phone]    Jewel Baize 09/19/2020, 10:17  AM

## 2020-09-19 NOTE — Progress Notes (Signed)
Occupational Therapy Treatment Patient Details Name: Jason Romero MRN: [id number] DOB: November 15, 1970 Today's Date: 09/19/2020    History of present illness 49 yo male admitted to [facility] on 10/31 after being found by wife slumped in bathroom with R deficits. CT head reveals intraparenchymal hematoma in L BG, repeat head CT shows increased cerebral hemorrhage with R midline shift 3 mm. PMH includes afib, COPD, GERD, HTN, MI.   OT comments  Pt seen in conjunction with PT to maximize pts activity tolerance and progress pts functional mobility goals. Pt continues to present with R sided weakness, impaired balance, decreased safety awareness and cognitive deficits impacting pts ability to complete BADLs. Pt able to progress OOB to recliner using the stedy this session with pt needing up to MOD A +1 for sitting balance EOB and MOD A +2 to stand to stedy and maintain standing balance in stedy. Attempted use of mirror to facilitate visual feedback with pt minimally interacting with mirror for its intended purpose. Pt completed light ADLs from recliner with up to s/u assist with LUE; RUE continues to be flaccid with pt reporting decreased sensation in RUE. Pt would continue to benefit from skilled occupational therapy while admitted and after d/c to address the below listed limitations in order to improve overall functional mobility and facilitate independence with BADL participation. DC plan remains appropriate, will follow acutely per POC.    Follow Up Recommendations  CIR    Equipment Recommendations  None recommended by OT    Recommendations for Other Services Rehab consult    Precautions / Restrictions Precautions Precautions: Fall Precaution Comments: R hemiplegia, R inattention, contraversive pushing Restrictions Weight Bearing Restrictions: No       Mobility Bed Mobility Overal bed mobility: Needs Assistance Bed Mobility: Supine to Sit;Sit to Supine     Supine to sit: Mod assist Sit to  supine: Mod assist   General bed mobility comments: Transitioned supine > sit L EOB, cuing pt to manage R LE towards EOB with min activation noted. Pt impulsive to attempt to ascend trunk while neglecting R side, cuing pt to remember to bring R UE with body for safety. ModA to maintain safety due to impulsive movements.  Transfers Overall transfer level: Needs assistance Equipment used: Ambulation equipment used Transfers: Sit to/from Stand Sit to Stand: Mod assist;+2 physical assistance;+2 safety/equipment         General transfer comment: Hand-over-hand assistance to place R hand on blure bar of stedy, cuing pt on sequence of steps to come to stand safely. Pt demonstrated strong R lateral lean upon coming to stand, requiring modAx2 to maintain balance and cue pt to find midline. Attempted mirror anterior to pt to encourage midline alignment, but pt tends to avoid looking in mirror despite cues.    Balance Overall balance assessment: Needs assistance Sitting-balance support: Feet supported;Bilateral upper extremity supported Sitting balance-Leahy Scale: Poor Sitting balance - Comments: Pt impulsive and tends to lean to the L or lose balance anteriorly when cued to sit upright. Attempted placing mirror anterior to pt but pt would avoid looking in mirror when cued. Cued pt to place weight through R UE and to maintain midline through placing B elbows on B thighs.  Postural control: Left lateral lean Standing balance support: Bilateral upper extremity supported Standing balance-Leahy Scale: Poor Standing balance comment: ModAx2 with intermittent maxA to avoid LOB with pt standing in stedy x2 bouts of ~30 sec each. Demonstrated strong R lateral lean, requiring cues and mirror anterior to pt  to attempt to find midline, with min success.                           ADL either performed or assessed with clinical judgement   ADL Overall ADL's : Needs assistance/impaired     Grooming:  Wash/dry face;Supervision/safety;Set up;Sitting;Oral care;Brushing hair;Total assistance Grooming Details (indicate cue type and reason): pt able to wash face with LUE from recliner with s/u assist, pt completed oral care with suction with LUE with s/u, but required total A to brush hair                 Toilet Transfer: Maximal assistance;Total assistance;+2 for physical assistance;+2 for safety/equipment Toilet Transfer Details (indicate cue type and reason): simulated via functional mobility with stedy and MAX A +2 for sitting balance while seated on stedy seat, pt was able to stand to stedy with MOD A +2 for safety         Functional mobility during ADLs: Maximal assistance;Total assistance;+2 for physical assistance General ADL Comments: pt presents with impaired balance, decreased activity tolerance, cognitive deficits  and R sided weakness     Vision   Vision Assessment?: Vision impaired- to be further tested in functional context Additional Comments: continues to present with L gaze preference but did track to R when cued   Perception     Praxis      Cognition Arousal/Alertness: Awake/alert Behavior During Therapy: Restless;Impulsive Overall Cognitive Status: Difficult to assess Area of Impairment: Following commands;Safety/judgement;Awareness;Problem solving                       Following Commands: Follows one step commands with increased time Safety/Judgement: Decreased awareness of safety Awareness: Emergent Problem Solving: Difficulty sequencing;Requires verbal cues;Requires tactile cues General Comments: pt continues to be impulsive with all mobility. pt able to choose name when choices provided, when given choices pt reprots liking rock music. pt with impaired safety awareness througout session continuing to present with R sided inattention needing cues to attend to R side        Exercises General Exercises - Upper Extremity Shoulder Flexion:  PROM;Right;5 reps;Seated Elbow Flexion: PROM;Right;5 reps;Seated Elbow Extension: PROM;Right;5 reps Wrist Flexion: PROM;Right;5 reps Wrist Extension: PROM;Right;5 reps Digit Composite Flexion: PROM;Right;5 reps Composite Extension: PROM;Right;5 reps  Elevated pts RUE on pillow at end of session.    Shoulder Instructions       General Comments      Pertinent Vitals/ Pain       Pain Assessment: No/denies pain Pain Intervention(s): Limited activity within patient's tolerance;Monitored during session  Home Living                                          Prior Functioning/Environment              Frequency  Min 2X/week        Progress Toward Goals  OT Goals(current goals can now be found in the care plan section)  Progress towards OT goals: Progressing toward goals  Acute Rehab OT Goals Patient Stated Goal: none explicitly stated OT Goal Formulation: Patient unable to participate in goal setting Time For Goal Achievement: 09/27/20 Potential to Achieve Goals: Good  Plan Discharge plan remains appropriate;Frequency remains appropriate    Co-evaluation    PT/OT/SLP Co-Evaluation/Treatment: Yes Reason for Co-Treatment: Complexity of the  patient's impairments (multi-system involvement);For patient/therapist safety;To address functional/ADL transfers;Necessary to address cognition/behavior during functional activity PT goals addressed during session: Mobility/safety with mobility;Balance OT goals addressed during session: ADL's and self-care      AM-PAC OT "6 Clicks" Daily Activity     Outcome Measure   Help from another person eating meals?: Total Help from another person taking care of personal grooming?: A Lot Help from another person toileting, which includes using toliet, bedpan, or urinal?: Total Help from another person bathing (including washing, rinsing, drying)?: A Lot Help from another person to put on and taking off regular upper body  clothing?: A Lot Help from another person to put on and taking off regular lower body clothing?: Total 6 Click Score: 9    End of Session Equipment Utilized During Treatment: Gait belt;Other (comment) (stedy)  OT Visit Diagnosis: Hemiplegia and hemiparesis;Cognitive communication deficit (R41.841) Symptoms and signs involving cognitive functions: Cerebral infarction Hemiplegia - Right/Left: Right Hemiplegia - dominant/non-dominant: Non-Dominant Hemiplegia - caused by: Nontraumatic intracerebral hemorrhage   Activity Tolerance Patient tolerated treatment well   Patient Left in chair;with call bell/phone within reach;with chair alarm set   Nurse Communication Mobility status        Time: 1610-9604 OT Time Calculation (min): 30 min  Charges: OT General Charges $OT Visit: 1 Visit OT Treatments $Therapeutic Activity: 8-22 mins  Audery Amel., COTA/L Acute Rehabilitation Services [phone]    Angelina Pih 09/19/2020, 10:24 AM

## 2020-09-19 NOTE — Progress Notes (Signed)
Nutrition Follow-up  DOCUMENTATION CODES:   Not applicable  INTERVENTION:   Continue Osmolite 1.5 @ 60 ml/hr via cortrak tube  Pro-Source TF 45 mL TID  250 ml free water flush every 6 hours  Regimen provides 2280 kcals, 123 g of protein and 1094 mL free water. Total free water: 2094 ml free water  NUTRITION DIAGNOSIS:   Inadequate oral intake related to acute illness as evidenced by NPO status.  Ongoing  GOAL:   Patient will meet greater than or equal to 90% of their needs  Met with TF  MONITOR:   TF tolerance, Diet advancement, Labs, Weight trends  REASON FOR ASSESSMENT:   Ventilator    ASSESSMENT:   49 yo male admitted with ICH, likely HTN etiology. PMH includes seizure, MI, HTN, a.fib  11/1- cortrak tube placed tip of tube confirmed in stomach) 11/2- s/p BSE- recommend continue NPO 11/5- s/p MBSS- recommend continue NPO  Pt sitting up in bed at time of visit. He responded "yeah" to most questions asked.   Case discussed with RN, who reports pt is more alert, but continues to have some expression aphasia. He remains on TF, which he is tolerating well.    Noted TF infusing via cortrak: Osmolite 1.5 @ 60 ml/hr, 45 ml Prosource TF BID, and 250 ml free water flush every 6 hours. Complete regimen provides 2280 kcals, 123 grams protein, and 1094 ml free water total free water: 2094 ml free water daily). This regimen meets 100% of estimated kcal and protein needs.   Per chart review, possible admission to CIR soon.   Labs reviewed: Na: 149, CBGS: 104-157 (inpatient orders for glycemic control are none).   NUTRITION - FOCUSED PHYSICAL EXAM:    Most Recent Value  Orbital Region No depletion  Upper Arm Region No depletion  Thoracic and Lumbar Region No depletion  Buccal Region No depletion  Temple Region No depletion  Clavicle Bone Region No depletion  Clavicle and Acromion Bone Region No depletion  Scapular Bone Region No depletion  Dorsal Hand No depletion   Patellar Region No depletion  Anterior Thigh Region No depletion  Posterior Calf Region No depletion  Edema (RD Assessment) None  Hair Reviewed  Eyes Reviewed  Mouth Reviewed  Skin Reviewed  Nails Reviewed       Diet Order:   Diet Order            Diet NPO time specified  Diet effective now                 EDUCATION NEEDS:   Not appropriate for education at this time  Skin:  Skin Assessment: Reviewed RN Assessment  Last BM:  09/18/20  Height:   Ht Readings from Last 1 Encounters:  09/11/20 6' 1.5" (1.867 m)    Weight:   Wt Readings from Last 1 Encounters:  09/19/20 106.9 kg   BMI:  Body mass index is 30.67 kg/m.  Estimated Nutritional Needs:   Kcal:  2200-2400 kcals  Protein:  115-130 g  Fluid:  >/= 2 L    Loistine Chance, RD, LDN, [place] Registered Dietitian II Certified Diabetes Care and Education Specialist Please refer to [facility] for RD and/or RD on-call/weekend/after hours pager

## 2020-09-19 NOTE — H&P (Signed)
Physical Medicine and Rehabilitation Admission H&P    Chief Complaint  Patient presents with  . Code Stroke  : HPI: Jason Romero. Markowicz is a 49 year old right-handed male with history of COPD/tobacco abuse with hypertension, history of seizures on no seizure medication, atrial fibrillation not on anticoagulation did not follow-up cardiology services.  Per chart review lives with spouse independent prior to admission.  1 level home with level entry.  Presented 09/11/2020 with right side weakness facial droop slumped over in his chair.  Blood pressure 195/127.  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  No shift or hydrocephalus.  CTA of head and neck no hemodynamically significant stenosis.  Echocardiogram with ejection fraction of 50 to 55% grade 2 diastolic dysfunction.  No regional wall motion abnormalities.  Admission chemistries unremarkable except glucose 114 potassium 2.8 urine drug screen positive amphetamines and marijuana, urinalysis negative nitrite.  Neurology follow-up initially placed on 3% saline.  ICH felt to be related to hypertensive crisis.  Placed on Cleviprex for blood pressure control.  Patient is currently n.p.o. with alternative means of nutritional support.  Therapy evaluations completed and patient was admitted for a comprehensive rehab program.  Review of Systems  Constitutional: Negative for chills and fever.  HENT: Negative for hearing loss.   Eyes: Negative for blurred vision and double vision.  Respiratory: Negative for cough.   Cardiovascular: Positive for palpitations and leg swelling.  Gastrointestinal: Positive for constipation. Negative for heartburn, nausea and vomiting.       GERD  Genitourinary: Negative for dysuria, flank pain and hematuria.  Musculoskeletal: Positive for myalgias.  Skin: Negative for rash.  Neurological: Positive for speech change and weakness.  Psychiatric/Behavioral: Positive for  depression. The patient has insomnia.   All other systems reviewed and are negative.  Past Medical History:  Diagnosis Date  . A-fib (HCC)   . Chronic headaches   . COPD (chronic obstructive pulmonary disease) (HCC)   . GERD (gastroesophageal reflux disease)   . History of blood transfusion   . Hypertension   . MI (myocardial infarction) (HCC)   . Seizures (HCC)    Past Surgical History:  Procedure Laterality Date  . CHOLECYSTECTOMY     Family History  Problem Relation Age of Onset  . Alcoholism Other   . Arthritis Other   . Breast cancer Other   . Heart disease Mother    Social History:  reports that he has been smoking. He has been smoking about 1.00 pack per day. He has never used smokeless tobacco. He reports current alcohol use. He reports current drug use. Drug: Marijuana. Allergies:  Allergies  Allergen Reactions  . Hydrocodone Hives  . Ibuprofen Other (See Comments)    Pt can't take this medication because it interacts with other medications that he is taking.    Medications Prior to Admission  Medication Sig Dispense Refill  . ALPRAZolam (XANAX) 1 MG tablet Take 1 tablet (1 mg total) by mouth 3 (three) times daily. (Patient not taking: Reported on 09/11/2020) 90 tablet 2  . buPROPion (WELLBUTRIN SR) 150 MG 12 hr tablet Take 150 mg (one tablet) by mouth daily for 3 days, then take 150 mg (1 tablet) by mouth twice daily (Patient not taking: Reported on 09/11/2020) 60 tablet 3  . hydrALAZINE (APRESOLINE) 50 MG tablet Take 1 tablet (50 mg total) by mouth 4 (four) times daily. (Patient not taking: Reported on 09/11/2020) 120 tablet 3  . hydrochlorothiazide (HYDRODIURIL)  25 MG tablet Take 1 tablet (25 mg total) by mouth daily. (Patient not taking: Reported on 09/11/2020) 90 tablet 3  . lisinopril (PRINIVIL,ZESTRIL) 40 MG tablet Take 1 tablet (40 mg total) by mouth daily. (Patient not taking: Reported on 09/11/2020) 90 tablet 0    Drug Regimen Review Drug regimen was  reviewed and remains appropriate with no significant issues identified  Home: Home Living Family/patient expects to be discharged to:: Private residence Living Arrangements: Spouse/significant other, Other relatives Available Help at Discharge: Family, Available 24 hours/day Type of Home: House Home Access: Level entry Home Layout: One level Bathroom Shower/Tub: Engineer, manufacturing systems: Standard Home Equipment: None  Lives With: Spouse   Functional History: Prior Function Level of Independence: Independent Comments: communicating via head nods, reports he doesn't drive or work; does some basic ADL around the home   Functional Status:  Mobility: Bed Mobility Overal bed mobility: Needs Assistance Bed Mobility: Supine to Sit, Sit to Supine Rolling: Min assist, Mod assist Supine to sit: Mod assist Sit to supine: Mod assist General bed mobility comments: Transitioned supine > sit L EOB, cuing pt to manage R LE towards EOB with min activation noted. Pt impulsive to attempt to ascend trunk while neglecting R side, cuing pt to remember to bring R UE with body for safety. ModA to maintain safety due to impulsive movements. Transfers Overall transfer level: Needs assistance Equipment used: Ambulation equipment used Transfer via Lift Equipment: Stedy Transfers: Sit to/from Stand Sit to Stand: Mod assist, +2 physical assistance, +2 safety/equipment Stand pivot transfers: Max assist, +2 physical assistance  Lateral/Scoot Transfers: Total assist, +2 physical assistance General transfer comment: Hand-over-hand assistance to place R hand on blure bar of stedy, cuing pt on sequence of steps to come to stand safely. Pt demonstrated strong R lateral lean upon coming to stand, requiring modAx2 to maintain balance and cue pt to find midline. Attempted mirror anterior to pt to encourage midline alignment, but pt tends to avoid looking in mirror despite cues.      ADL: ADL Overall ADL's :  Needs assistance/impaired Eating/Feeding: NPO Grooming: Wash/dry face, Supervision/safety, Set up, Sitting, Oral care, Brushing hair, Total assistance Grooming Details (indicate cue type and reason): pt able to wash face with LUE from recliner with s/u assist, pt completed oral care with suction with LUE with s/u, but required total A to brush hair Lower Body Dressing: Maximal assistance, +2 for physical assistance, +2 for safety/equipment Lower Body Dressing Details (indicate cue type and reason): pt able to doff socks with mod A for sitting balance  Toilet Transfer: Maximal assistance, Total assistance, +2 for physical assistance, +2 for safety/equipment Toilet Transfer Details (indicate cue type and reason): simulated via functional mobility with stedy and MAX A +2 for sitting balance while seated on stedy seat, pt was able to stand to stedy with MOD A +2 for safety Functional mobility during ADLs: Maximal assistance, Total assistance, +2 for physical assistance General ADL Comments: pt presents with impaired balance, decreased activity tolerance, cognitive deficits  and R sided weakness  Cognition: Cognition Overall Cognitive Status: Difficult to assess Arousal/Alertness: Awake/alert Orientation Level: Oriented to person, Disoriented to place, Disoriented to time, Oriented to situation Attention: Sustained Sustained Attention: Impaired Sustained Attention Impairment: Verbal basic, Functional basic Awareness: Impaired Awareness Impairment: Intellectual impairment Problem Solving: Impaired Problem Solving Impairment: Verbal basic Cognition Arousal/Alertness: Awake/alert Behavior During Therapy: Restless, Impulsive Overall Cognitive Status: Difficult to assess Area of Impairment: Following commands, Safety/judgement, Awareness, Problem solving Following Commands:  Follows one step commands with increased time Safety/Judgement: Decreased awareness of safety Awareness: Emergent Problem  Solving: Difficulty sequencing, Requires verbal cues, Requires tactile cues General Comments: pt continues to be impulsive with all mobility. pt able to choose name when choices provided, when given choices pt reprots liking rock music. pt with impaired safety awareness througout session continuing to present with R sided inattention needing cues to attend to R side Difficult to assess due to: Impaired communication  Physical Exam: Blood pressure 111/90, pulse 70, temperature (!) 97.5 F (36.4 C), temperature source Oral, resp. rate 20, height 6' 1.5" (1.867 m), weight 109.3 kg, SpO2 98 %. Physical Exam Constitutional:      General: He is not in acute distress. HENT:     Head:     Comments: Nasogastric tube  in place    Left Ear: External ear normal.     Nose: Nose normal.  Eyes:     Pupils: Pupils are equal, round, and reactive to light.  Cardiovascular:     Rate and Rhythm: Normal rate and regular rhythm.     Pulses: Normal pulses.  Pulmonary:     Effort: Pulmonary effort is normal. No respiratory distress.     Breath sounds: No wheezing, rhonchi or rales.  Abdominal:     General: Bowel sounds are normal.     Palpations: Abdomen is soft.  Musculoskeletal:        General: No swelling or tenderness.     Cervical back: Normal range of motion.     Right lower leg: No edema.     Left lower leg: No edema.  Skin:    General: Skin is warm.  Neurological:     Mental Status: He is alert.     Comments: Patient is alert in no acute distress. He demonstrates a left gaze preference. He is globally aphasic. Mumbles incoherent words and phrases. He was occasionally able to utter something such as his first name or thank you. Follows some simple commands. Dense right hemiparesis. LUE and LLE 4/5.  Right central VII, decreased sense of pain in right arm and leg.   Psychiatric:     Comments: Overall pleasant and cooperative     Results for orders placed or performed during the hospital  encounter of 09/11/20 (from the past 48 hour(s))  Glucose, capillary     Status: None   Collection Time: 09/18/20  1:32 PM  Result Value Ref Range   Glucose-Capillary 99 70 - 99 mg/dL    Comment: Glucose reference range applies only to samples taken after fasting for at least 8 hours.   Comment 1 Notify RN    Comment 2 Document in Chart   Glucose, capillary     Status: Abnormal   Collection Time: 09/18/20  5:05 PM  Result Value Ref Range   Glucose-Capillary 139 (H) 70 - 99 mg/dL    Comment: Glucose reference range applies only to samples taken after fasting for at least 8 hours.  Glucose, capillary     Status: Abnormal   Collection Time: 09/18/20  8:46 PM  Result Value Ref Range   Glucose-Capillary 146 (H) 70 - 99 mg/dL    Comment: Glucose reference range applies only to samples taken after fasting for at least 8 hours.  Glucose, capillary     Status: Abnormal   Collection Time: 09/19/20 12:17 AM  Result Value Ref Range   Glucose-Capillary 137 (H) 70 - 99 mg/dL    Comment: Glucose reference range applies  only to samples taken after fasting for at least 8 hours.  CBC with Differential/Platelet     Status: None   Collection Time: 09/19/20  1:42 AM  Result Value Ref Range   WBC 8.3 4.0 - 10.5 K/uL   RBC 4.93 4.22 - 5.81 MIL/uL   Hemoglobin 13.7 13.0 - 17.0 g/dL   HCT 41.3 39 - 52 %   MCV 88.0 80.0 - 100.0 fL   MCH 27.8 26.0 - 34.0 pg   MCHC 31.6 30.0 - 36.0 g/dL   RDW 24.4 01.0 - 27.2 %   Platelets 269 150 - 400 K/uL   nRBC 0.0 0.0 - 0.2 %   Neutrophils Relative % 69 %   Neutro Abs 5.9 1.7 - 7.7 K/uL   Lymphocytes Relative 18 %   Lymphs Abs 1.5 0.7 - 4.0 K/uL   Monocytes Relative 9 %   Monocytes Absolute 0.8 0.1 - 1.0 K/uL   Eosinophils Relative 2 %   Eosinophils Absolute 0.1 0.0 - 0.5 K/uL   Basophils Relative 1 %   Basophils Absolute 0.0 0.0 - 0.1 K/uL   Immature Granulocytes 1 %   Abs Immature Granulocytes 0.05 0.00 - 0.07 K/uL    Comment: Performed at [facility], [address], [place], [place] [address]  Comprehensive metabolic panel     Status: Abnormal   Collection Time: 09/19/20  1:42 AM  Result Value Ref Range   Sodium 149 (H) 135 - 145 mmol/L   Potassium 3.7 3.5 - 5.1 mmol/L   Chloride 118 (H) 98 - 111 mmol/L   CO2 21 (L) 22 - 32 mmol/L   Glucose, Bld 158 (H) 70 - 99 mg/dL    Comment: Glucose reference range applies only to samples taken after fasting for at least 8 hours.   BUN 46 (H) 6 - 20 mg/dL   Creatinine, Ser 4.03 0.61 - 1.24 mg/dL   Calcium 8.9 8.9 - 47.4 mg/dL   Total Protein 7.0 6.5 - 8.1 g/dL   Albumin 3.2 (L) 3.5 - 5.0 g/dL   AST 46 (H) 15 - 41 U/L   ALT 55 (H) 0 - 44 U/L   Alkaline Phosphatase 62 38 - 126 U/L   Total Bilirubin 0.6 0.3 - 1.2 mg/dL   GFR, Estimated >25 >95 mL/min    Comment: (NOTE) Calculated using the CKD-EPI Creatinine Equation (2021)    Anion gap 10 5 - 15    Comment: Performed at [facility], [address] [place], [place] [address]  Phosphorus     Status: None   Collection Time: 09/19/20  1:42 AM  Result Value Ref Range   Phosphorus 4.2 2.5 - 4.6 mg/dL    Comment: Performed at [facility], [address], [place], [place] [address]  Magnesium     Status: None   Collection Time: 09/19/20  1:42 AM  Result Value Ref Range   Magnesium 2.3 1.7 - 2.4 mg/dL    Comment: Performed at [facility], [address], [place], [place] [address]  Glucose, capillary     Status: Abnormal   Collection Time: 09/19/20  4:57 AM  Result Value Ref Range   Glucose-Capillary 104 (H) 70 - 99 mg/dL    Comment: Glucose reference range applies only to samples taken after fasting for at least 8 hours.  Glucose, capillary     Status: Abnormal   Collection Time: 09/19/20  7:41 AM  Result Value Ref Range  Glucose-Capillary 157 (H) 70 - 99 mg/dL    Comment: Glucose reference range applies only to samples taken after fasting for at least 8 hours.  Glucose, capillary     Status: Abnormal    Collection Time: 09/19/20 11:30 AM  Result Value Ref Range   Glucose-Capillary 131 (H) 70 - 99 mg/dL    Comment: Glucose reference range applies only to samples taken after fasting for at least 8 hours.  Glucose, capillary     Status: Abnormal   Collection Time: 09/19/20  4:36 PM  Result Value Ref Range   Glucose-Capillary 134 (H) 70 - 99 mg/dL    Comment: Glucose reference range applies only to samples taken after fasting for at least 8 hours.  Glucose, capillary     Status: Abnormal   Collection Time: 09/19/20  8:19 PM  Result Value Ref Range   Glucose-Capillary 137 (H) 70 - 99 mg/dL    Comment: Glucose reference range applies only to samples taken after fasting for at least 8 hours.  Glucose, capillary     Status: Abnormal   Collection Time: 09/19/20 11:57 PM  Result Value Ref Range   Glucose-Capillary 137 (H) 70 - 99 mg/dL    Comment: Glucose reference range applies only to samples taken after fasting for at least 8 hours.  Basic metabolic panel     Status: Abnormal   Collection Time: 09/20/20  3:30 AM  Result Value Ref Range   Sodium 148 (H) 135 - 145 mmol/L   Potassium 3.8 3.5 - 5.1 mmol/L   Chloride 116 (H) 98 - 111 mmol/L   CO2 21 (L) 22 - 32 mmol/L   Glucose, Bld 150 (H) 70 - 99 mg/dL    Comment: Glucose reference range applies only to samples taken after fasting for at least 8 hours.   BUN 42 (H) 6 - 20 mg/dL   Creatinine, Ser 6.65 0.61 - 1.24 mg/dL   Calcium 8.9 8.9 - 99.3 mg/dL   GFR, Estimated >57 >01 mL/min    Comment: (NOTE) Calculated using the CKD-EPI Creatinine Equation (2021)    Anion gap 11 5 - 15    Comment: Performed at [facility], [address] [place], [place] [address]  Glucose, capillary     Status: Abnormal   Collection Time: 09/20/20  4:32 AM  Result Value Ref Range   Glucose-Capillary 121 (H) 70 - 99 mg/dL    Comment: Glucose reference range applies only to samples taken after fasting for at least 8 hours.  Glucose, capillary      Status: Abnormal   Collection Time: 09/20/20  7:49 AM  Result Value Ref Range   Glucose-Capillary 137 (H) 70 - 99 mg/dL    Comment: Glucose reference range applies only to samples taken after fasting for at least 8 hours.   Comment 1 Notify RN    Comment 2 Document in Chart    No results found.     Medical Problem List and Plan: 1.  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  Antithrombotics: -DVT/anticoagulation: SCDs  -antiplatelet therapy: N/A 3. Pain Management: Tylenol as needed 4. Mood: Provide emotional support  -antipsychotic agents: N/A 5. Neuropsych: This patient is not capable of making decisions on his own behalf. 6. Skin/Wound Care: Routine skin checks 7. Fluids/Electrolytes/Nutrition: Routine in and outs with follow-up chemistries 8.  Dysphagia.  NPO.  Alternative means of nutritional support.  Speech therapy follow-up 9.  Hypertension.  Norvasc 10 mg daily, hydralazine 100 mg every 8 hours, Normodyne 300 mg every 8 hours, lisinopril 20 mg twice daily.  Monitor with increased mobility 10.  History of tobacco abuse as well as marijuana use.  Urine drug screen positive marijuana.  Provide counseling 11.  Obesity.  BMI 30.67.  Dietary follow-up       Charlton Amor, PA-C 09/20/2020

## 2020-09-20 ENCOUNTER — Encounter (HOSPITAL_COMMUNITY): Payer: Self-pay | Admitting: Physical Medicine & Rehabilitation

## 2020-09-20 ENCOUNTER — Inpatient Hospital Stay (HOSPITAL_COMMUNITY)
Admission: RE | Admit: 2020-09-20 | Discharge: 2020-10-19 | DRG: 057 | Disposition: A | Discharge status: Home Health | Payer: Medicaid Other | Source: Intra-hospital | Attending: Physical Medicine & Rehabilitation | Admitting: Physical Medicine & Rehabilitation

## 2020-09-20 ENCOUNTER — Other Ambulatory Visit: Payer: Self-pay

## 2020-09-20 DIAGNOSIS — I69292 Facial weakness following other nontraumatic intracranial hemorrhage: Secondary | ICD-10-CM

## 2020-09-20 DIAGNOSIS — D62 Acute posthemorrhagic anemia: Secondary | ICD-10-CM | POA: Diagnosis not present

## 2020-09-20 DIAGNOSIS — J449 Chronic obstructive pulmonary disease, unspecified: Secondary | ICD-10-CM | POA: Diagnosis present

## 2020-09-20 DIAGNOSIS — I69253 Hemiplegia and hemiparesis following other nontraumatic intracranial hemorrhage affecting right non-dominant side: Principal | ICD-10-CM

## 2020-09-20 DIAGNOSIS — Z9049 Acquired absence of other specified parts of digestive tract: Secondary | ICD-10-CM

## 2020-09-20 DIAGNOSIS — Z7151 Drug abuse counseling and surveillance of drug abuser: Secondary | ICD-10-CM | POA: Diagnosis not present

## 2020-09-20 DIAGNOSIS — I69391 Dysphagia following cerebral infarction: Secondary | ICD-10-CM

## 2020-09-20 DIAGNOSIS — I252 Old myocardial infarction: Secondary | ICD-10-CM

## 2020-09-20 DIAGNOSIS — E669 Obesity, unspecified: Secondary | ICD-10-CM | POA: Diagnosis present

## 2020-09-20 DIAGNOSIS — K219 Gastro-esophageal reflux disease without esophagitis: Secondary | ICD-10-CM | POA: Diagnosis present

## 2020-09-20 DIAGNOSIS — G8110 Spastic hemiplegia affecting unspecified side: Secondary | ICD-10-CM

## 2020-09-20 DIAGNOSIS — Z885 Allergy status to narcotic agent status: Secondary | ICD-10-CM | POA: Diagnosis not present

## 2020-09-20 DIAGNOSIS — F17210 Nicotine dependence, cigarettes, uncomplicated: Secondary | ICD-10-CM | POA: Diagnosis present

## 2020-09-20 DIAGNOSIS — I4891 Unspecified atrial fibrillation: Secondary | ICD-10-CM | POA: Diagnosis present

## 2020-09-20 DIAGNOSIS — R4701 Aphasia: Secondary | ICD-10-CM

## 2020-09-20 DIAGNOSIS — Z6830 Body mass index (BMI) 30.0-30.9, adult: Secondary | ICD-10-CM

## 2020-09-20 DIAGNOSIS — R1310 Dysphagia, unspecified: Secondary | ICD-10-CM | POA: Diagnosis present

## 2020-09-20 DIAGNOSIS — R32 Unspecified urinary incontinence: Secondary | ICD-10-CM | POA: Diagnosis not present

## 2020-09-20 DIAGNOSIS — R0989 Other specified symptoms and signs involving the circulatory and respiratory systems: Secondary | ICD-10-CM

## 2020-09-20 DIAGNOSIS — I69291 Dysphagia following other nontraumatic intracranial hemorrhage: Secondary | ICD-10-CM | POA: Diagnosis not present

## 2020-09-20 DIAGNOSIS — I10 Essential (primary) hypertension: Secondary | ICD-10-CM | POA: Diagnosis present

## 2020-09-20 DIAGNOSIS — R159 Full incontinence of feces: Secondary | ICD-10-CM | POA: Diagnosis not present

## 2020-09-20 DIAGNOSIS — I619 Nontraumatic intracerebral hemorrhage, unspecified: Principal | ICD-10-CM | POA: Diagnosis present

## 2020-09-20 DIAGNOSIS — Z8679 Personal history of other diseases of the circulatory system: Secondary | ICD-10-CM | POA: Diagnosis present

## 2020-09-20 DIAGNOSIS — I69220 Aphasia following other nontraumatic intracranial hemorrhage: Secondary | ICD-10-CM

## 2020-09-20 DIAGNOSIS — Z886 Allergy status to analgesic agent status: Secondary | ICD-10-CM

## 2020-09-20 DIAGNOSIS — Z79899 Other long term (current) drug therapy: Secondary | ICD-10-CM

## 2020-09-20 DIAGNOSIS — I610 Nontraumatic intracerebral hemorrhage in hemisphere, subcortical: Secondary | ICD-10-CM

## 2020-09-20 LAB — BASIC METABOLIC PANEL
Anion gap: 11 (ref 5–15)
BUN: 42 mg/dL — ABNORMAL HIGH (ref 6–20)
CO2: 21 mmol/L — ABNORMAL LOW (ref 22–32)
Calcium: 8.9 mg/dL (ref 8.9–10.3)
Chloride: 116 mmol/L — ABNORMAL HIGH (ref 98–111)
Creatinine, Ser: 1.07 mg/dL (ref 0.61–1.24)
GFR, Estimated: >60 mL/min (ref >60)
Glucose, Bld: 150 mg/dL — ABNORMAL HIGH (ref 70–99)
Potassium: 3.8 mmol/L (ref 3.5–5.1)
Sodium: 148 mmol/L — ABNORMAL HIGH (ref 135–145)

## 2020-09-20 LAB — GLUCOSE, CAPILLARY
Glucose-Capillary: 121 mg/dL — ABNORMAL HIGH (ref 70–99)
Glucose-Capillary: 137 mg/dL — ABNORMAL HIGH (ref 70–99)
Glucose-Capillary: 131 mg/dL — ABNORMAL HIGH (ref 70–99)
Glucose-Capillary: 110 mg/dL — ABNORMAL HIGH (ref 70–99)

## 2020-09-20 MED ORDER — PROSOURCE TF PO LIQD
45.0000 mL | Freq: Three times a day (TID) | ORAL | Status: DC
Start: 2020-09-20 — End: 2020-10-19

## 2020-09-20 MED ORDER — SENNOSIDES 8.8 MG/5ML PO SYRP
5.0000 mL | ORAL_SOLUTION | Freq: Two times a day (BID) | ORAL | Status: DC
  Administered 2020-09-21 – 2020-09-28 (×11): 5 mL
  Filled 2020-09-20 (×18): qty 5

## 2020-09-20 MED ORDER — FREE WATER: 200.0000 mL | Status: DC

## 2020-09-20 MED ORDER — AMLODIPINE BESYLATE 10 MG PO TABS: 10.0000 mg | ORAL_TABLET | Freq: Every day | ORAL | Status: DC

## 2020-09-20 MED ORDER — OSMOLITE 1.5 CAL PO LIQD
1000.0000 mL | ORAL | 0 refills | Status: DC
Start: 2020-09-20 — End: 2020-10-19

## 2020-09-20 MED ORDER — HYDRALAZINE HCL 100 MG PO TABS
100.0000 mg | ORAL_TABLET | Freq: Three times a day (TID) | ORAL | Status: DC
Start: 2020-09-20 — End: 2020-10-19

## 2020-09-20 MED ORDER — HYDRALAZINE HCL 50 MG PO TABS
100.0000 mg | ORAL_TABLET | Freq: Three times a day (TID) | ORAL | Status: DC
  Administered 2020-09-21 – 2020-09-23 (×7): 100 mg
  Filled 2020-09-20 (×9): qty 2

## 2020-09-20 MED ORDER — SENNOSIDES 8.8 MG/5ML PO SYRP
5.0000 mL | ORAL_SOLUTION | Freq: Two times a day (BID) | ORAL | 0 refills | Status: DC
Start: 2020-09-20 — End: 2020-10-19

## 2020-09-20 MED ORDER — ACETAMINOPHEN 650 MG RE SUPP: 650.0000 mg | RECTAL | Status: DC | PRN

## 2020-09-20 MED ORDER — FREE WATER
200.0000 mL | Status: DC
  Administered 2020-09-20 (×2): 200 mL

## 2020-09-20 MED ORDER — OSMOLITE 1.5 CAL PO LIQD
1000.0000 mL | ORAL | Status: DC
  Administered 2020-09-20 – 2020-09-21 (×2): 1000 mL

## 2020-09-20 MED ORDER — FREE WATER
200.0000 mL | Status: DC
  Administered 2020-09-20 – 2020-09-29 (×52): 200 mL

## 2020-09-20 MED ORDER — PANTOPRAZOLE SODIUM 40 MG PO PACK
40.0000 mg | PACK | Freq: Every day | ORAL | Status: DC
  Administered 2020-09-21 – 2020-09-29 (×9): 40 mg
  Filled 2020-09-20 (×9): qty 20

## 2020-09-20 MED ORDER — PROSOURCE TF PO LIQD
45.0000 mL | Freq: Three times a day (TID) | ORAL | Status: DC
  Administered 2020-09-20 – 2020-09-29 (×26): 45 mL
  Filled 2020-09-20 (×26): qty 45

## 2020-09-20 MED ORDER — PANTOPRAZOLE SODIUM 40 MG PO PACK
40.0000 mg | PACK | Freq: Every day | ORAL | Status: DC
Start: 2020-09-21 — End: 2020-10-19

## 2020-09-20 MED ORDER — LABETALOL HCL 100 MG PO TABS
300.0000 mg | ORAL_TABLET | Freq: Three times a day (TID) | ORAL | Status: DC
  Administered 2020-09-21 – 2020-09-29 (×24): 300 mg
  Filled 2020-09-20 (×26): qty 3

## 2020-09-20 MED ORDER — ACETAMINOPHEN 160 MG/5ML PO SOLN
650.0000 mg | ORAL | Status: DC | PRN
  Administered 2020-09-21 – 2020-09-24 (×3): 650 mg
  Filled 2020-09-20 (×5): qty 20.3

## 2020-09-20 MED ORDER — LISINOPRIL 20 MG PO TABS
20.0000 mg | ORAL_TABLET | Freq: Two times a day (BID) | ORAL | Status: DC
Start: 2020-09-20 — End: 2020-10-19

## 2020-09-20 MED ORDER — LABETALOL HCL 300 MG PO TABS
300.0000 mg | ORAL_TABLET | Freq: Three times a day (TID) | ORAL | Status: DC
Start: 2020-09-20 — End: 2020-10-19

## 2020-09-20 MED ORDER — AMLODIPINE BESYLATE 10 MG PO TABS
10.0000 mg | ORAL_TABLET | Freq: Every day | ORAL | Status: DC
  Administered 2020-09-21 – 2020-09-29 (×9): 10 mg
  Filled 2020-09-20 (×9): qty 1

## 2020-09-20 MED ORDER — ACETAMINOPHEN 160 MG/5ML PO SOLN: 650.0000 mg | ORAL | 0 refills | Status: DC | PRN

## 2020-09-20 MED ORDER — ACETAMINOPHEN 325 MG PO TABS
650.0000 mg | ORAL_TABLET | ORAL | Status: DC | PRN
  Administered 2020-09-21 – 2020-10-17 (×10): 650 mg via ORAL
  Filled 2020-09-20 (×18): qty 2

## 2020-09-20 MED ORDER — LISINOPRIL 20 MG PO TABS
20.0000 mg | ORAL_TABLET | Freq: Two times a day (BID) | ORAL | Status: DC
  Administered 2020-09-21 – 2020-09-29 (×16): 20 mg
  Filled 2020-09-20 (×18): qty 1

## 2020-09-20 NOTE — Progress Notes (Signed)
Pt arrived to unit, Osmolite going at 60 with 223m flushes Q4. Thought Cortrac in left nare. Pt alert but with expressive aphasia. Oriented to rehab, HOB at 35 degrees. All needs met, call bell in reach.

## 2020-09-20 NOTE — Discharge Summary (Signed)
Physician Discharge Summary  [name] QZR:[id number] DOB: 1970/11/18 DOA: 09/11/2020  PCP: Patient, No Pcp Per  Admit date: 09/11/2020 Discharge date: 09/20/2020  Admitted From: Home Discharge disposition: CIR   Code Status: Full Code  Diet Recommendation: Cardiac diet  Discharge Diagnosis:   Active Problems:   Stroke, hemorrhagic (HCC)   Dysphagia due to recent stroke  History of Present Illness / Brief narrative:  Jason Romero is a 49 y.o. male who presented to the ED on 09/11/2020 with right-sided weakness and facial droop. PMH significant for HTN, CAD, A. fib not on anticoagulation, seizures, COPD/tobacco abuse, poor follow-up. In the ED, patient was noted to be hypertensive to 195/127.  He had right hemiplegia, aphasia and dysphagia. Imaging showed intraparenchymal hematoma in the left basal ganglia measuring 5.6 cm size He was admitted to neuro ICU, treated with Cleviprex drip, 3% normal saline, subsequent repeat CT scan showed stability. 11/6 -transferred out of ICU to floors under hospitalist service.    Currently he is stable, on 4 HTN meds with persistent mild HTN. NPO on Cortrak.  Subjective:  Seen and examined this morning. Propped up in bed.  Not in distress.  Wife at bedside. Sodium level slightly better today at 148 after addition of free water through core track.  Hospital Course:  Left basal ganglia intracranial hemorrhage Cerebral edema with midline shift -Intra-parenchymal hemorrhage was likely secondary to uncontrolled hypertension -Initial few CT scans of head showed worsening of the size of hematoma with surrounding edema.  Patient was monitored in ICU and also briefly given 3% normal saline. Last scan from 11/4 showed stable ICH with mild midline shift. -Echocardiogram with EF 50 to 50%, mild LVH, no source of embolus. -LDL 61, A1c 5.6 -UDS was positive for THC and amphetamine. -Prior to admission, patient was on aspirin 81 mg daily.   Because of ICH, he is currently not on any antiplatelet or anticoagulant. -Plan is to follow-up with neurology in 4 weeks for repeat imaging.  Once hemorrhage resolves, patient may be considered for anticoagulation because of coexisting A. Fib -Current deficits: Dysphagia, aphasia, right hemiplegia, inconsistently follows motor command -PT recommended CIR.  Hypertensive emergency History of essential hypertension -Reported noncompliance to medications at home. -In the ICU, patient required Cleviprex drip. -Currently on oral blood pressure medicines including amlodipine 10 mg daily, labetalol 200 mg every 8 hours, lisinopril 20 mg daily, hydralazine 100 mg every 8 hours. -Short-term blood pressure goal systolic less than 160.  Long-term blood pressure goal less than 140.  Hypernatremia -Apparently, sodium level has remained elevated throughout his stay.  In the ICU, he was getting 3% normal saline.  Currently off it.  -11/8, sodium level was 149. I started the patient on free water via core track 250 mL every 6 hours.  Sodium level slightly better at 148 today.  I would switch free water to 200 mL every 4 hours.  Sodium level needs to monitor while at CIR. Recent Labs  Lab 09/14/20 0909 09/14/20 1441 09/14/20 2100 09/15/20 0535 09/15/20 0855 09/15/20 1514 09/15/20 2038 09/16/20 0424 09/19/20 0142 09/20/20 0330  NA 146* 149* 149* 147* 148* 150* 147* 148* 149* 148*   Chronic afib -Remains rate controlled.   -Was not on blood thinners at home.  No antithrombotic or anticoagulant at this time because of ICH.   Dysphagia -Secondary to stroke. -Currently has a core track feeding with tube feeding at 60 ml/h. -Needs to follow-up with speech therapy.  May need PEG  tube placement for long-term nutrition.  Low electrolytes -Trend as below.  Replacement intermittently. Recent Labs  Lab 09/13/20 1654 09/14/20 0232 09/15/20 0535 09/16/20 0424 09/19/20 0142 09/20/20 0330  K  --   3.3* 3.7 3.9 3.7 3.8  MG 1.9  --   --  2.4 2.3  --   PHOS 3.3  --   --   --  4.2  --    COPD Current everyday smoker -Was smoking 2 pack/day -Counseled to quit.  Polysubstance abuse -UDS on admission was positive for THC and amphetamine.   -Counseled to quit   History of seizures -Not on antiepileptics.  Wound care:    Discharge Exam:   Vitals:   09/19/20 2355 09/20/20 0431 09/20/20 0500 09/20/20 0746  BP: 125/85 124/72  111/90  Pulse: 88 76  70  Resp: 18 19  20   Temp: 98 F (36.7 C) 97.7 F (36.5 C)  (!) 97.5 F (36.4 C)  TempSrc: Oral Oral  Oral  SpO2: 97% 98%  98%  Weight:   109.3 kg   Height:        Body mass index is 31.36 kg/m.  General exam: Appears calm and comfortable.  Not in physical distress Skin: No rashes, lesions or ulcers. HEENT: Atraumatic, has a core track tube in place Lungs: Clear to auscultation bilaterally CVS: Regular rate and rhythm, no murmur GI/Abd soft, nontender, nondistended, bowel sound present CNS: Alert, awake, has significant expressive aphasia, dysphagia, dense right hemiplegia and inconsistent ability to follow commands Psychiatry: Depressed look Extremities: No pedal edema, no calf tenderness  Follow ups:   Discharge Instructions    Diet - low sodium heart healthy   Complete by: As directed    Increase activity slowly   Complete by: As directed       Follow-up Information    [facility] up.   Contact information: [address]     [address] [place] [phone]       [facility] Follow up.   Contact information: [address] [place] [phone]              Recommendations for Outpatient Follow-Up:   1. Follow-up with neurology as an outpatient  Discharge Instructions:  Follow with Primary MD Patient, No Pcp Per in 7 days   Get CBC/BMP checked in next visit within 1 week  by PCP or SNF MD ( we routinely change or add medications that can affect your baseline labs and fluid status, therefore we recommend that you get the mentioned basic workup next visit with your PCP, your PCP may decide not to get them or add new tests based on their clinical decision)  On your next visit with your PCP, please Get Medicines reviewed and adjusted.  Please request your PCP  to go over all Hospital Tests and Procedure/Radiological results at the follow up, please get all Hospital records sent to your Prim MD by signing hospital release before you go home.  Activity: As tolerated with Full fall precautions use walker/cane & assistance as needed  For Heart failure patients - Check your Weight same time everyday, if you gain over 2 pounds, or you develop in leg swelling, experience more shortness of breath or chest pain, call your Primary MD immediately. Follow Cardiac Low Salt Diet and 1.5 lit/day fluid restriction.  If you have smoked or chewed Tobacco in the last 2 yrs please stop smoking,  stop any regular Alcohol  and or any Recreational drug use.  If you experience worsening of your admission symptoms, develop shortness of breath, life threatening emergency, suicidal or homicidal thoughts you must seek medical attention immediately by calling 911 or calling your MD immediately  if symptoms less severe.  You Must read complete instructions/literature along with all the possible adverse reactions/side effects for all the Medicines you take and that have been prescribed to you. Take any new Medicines after you have completely understood and accpet all the possible adverse reactions/side effects.   Do not drive, operate heavy machinery, perform activities at heights, swimming or participation in water activities or provide baby sitting services if your were admitted for syncope or siezures until you have seen by Primary MD or a Neurologist and advised to do so again.  Do not drive when  taking Pain medications.  Do not take more than prescribed Pain, Sleep and Anxiety Medications  Wear Seat belts while driving.   Please note You were cared for by a hospitalist during your hospital stay. If you have any questions about your discharge medications or the care you received while you were in the hospital after you are discharged, you can call the unit and asked to speak with the hospitalist on call if the hospitalist that took care of you is not available. Once you are discharged, your primary care physician will handle any further medical issues. Please note that NO REFILLS for any discharge medications will be authorized once you are discharged, as it is imperative that you return to your primary care physician (or establish a relationship with a primary care physician if you do not have one) for your aftercare needs so that they can reassess your need for medications and monitor your lab values.    Allergies as of 09/20/2020      Reactions   Hydrocodone Hives   Ibuprofen Other (See Comments)   Pt can't take this medication because it interacts with other medications that he is taking.       Medication List    STOP taking these medications   ALPRAZolam 1 MG tablet Commonly known as: XANAX   buPROPion 150 MG 12 hr tablet Commonly known as: WELLBUTRIN SR   hydrochlorothiazide 25 MG tablet Commonly known as: HYDRODIURIL     TAKE these medications   acetaminophen 160 MG/5ML solution Commonly known as: TYLENOL Place 20.3 mLs (650 mg total) into feeding tube every 4 (four) hours as needed for mild pain (or temp > 37.5 C (99.5 F)).   amLODipine 10 MG tablet Commonly known as: NORVASC Place 1 tablet (10 mg total) into feeding tube daily. Start taking on: September 21, 2020   feeding supplement (OSMOLITE 1.5 CAL) Liqd Place 1,000 mLs into feeding tube continuous.   feeding supplement (PROSource TF) liquid Place 45 mLs into feeding tube 3 (three) times daily.   free  water Soln Place 200 mLs into feeding tube every 4 (four) hours.   hydrALAZINE 100 MG tablet Commonly known as: APRESOLINE Place 1 tablet (100 mg total) into feeding tube every 8 (eight) hours. What changed:   medication strength  how much to take  how to take this  when to take this   labetalol 300 MG tablet Commonly known as: NORMODYNE Place 1 tablet (300 mg total) into feeding tube every 8 (eight) hours.   lisinopril 20 MG tablet Commonly known as: ZESTRIL Place 1 tablet (20 mg total) into feeding tube 2 (two) times  daily. What changed:   medication strength  how much to take  how to take this  when to take this   pantoprazole sodium 40 mg/20 mL Pack Commonly known as: PROTONIX Place 20 mLs (40 mg total) into feeding tube daily. Start taking on: September 21, 2020   sennosides 8.8 MG/5ML syrup Commonly known as: SENOKOT Place 5 mLs into feeding tube 2 (two) times daily.       Time coordinating discharge: 35 minutes  The results of significant diagnostics from this hospitalization (including imaging, microbiology, ancillary and laboratory) are listed below for reference.    Procedures and Diagnostic Studies:   CT ANGIO HEAD W OR WO CONTRAST  Result Date: 09/11/2020 CLINICAL DATA:  Parenchymal hemorrhage EXAM: CT ANGIOGRAPHY HEAD AND NECK TECHNIQUE: Multidetector CT imaging of the head and neck was performed using the standard protocol during bolus administration of intravenous contrast. Multiplanar CT image reconstructions and MIPs were obtained to evaluate the vascular anatomy. Carotid stenosis measurements (when applicable) are obtained utilizing NASCET criteria, using the distal internal carotid diameter as the denominator. CONTRAST:  75mL OMNIPAQUE IOHEXOL 350 MG/ML SOLN COMPARISON:  None. FINDINGS: CT HEAD Brain: Increase in size of left cerebral hemorrhage with involvement of basal ganglia, central white matter, and subinsular white matter. Currently  measures 5.6 x 2.5 x 4.5 cm (previously 4.2 x 2.4 x 4.5 cm). Surrounding edema is slightly increased as well. There is greater effacement of the left lateral ventricle without midline shift or hydrocephalus. No new loss of gray-white differentiation. Vascular: No new abnormality. Skull: Calvarium is unremarkable. Sinuses/Orbits: No acute finding. Other: None. Review of the MIP images confirms the above findings CTA NECK Aortic arch: Great vessel origins are patent. There is circumferential atherosclerotic thickening at the origins. Right carotid system: Patent. There is plaque along the common carotid causing minimal stenosis. Mixed plaque at the ICA origin causes less than 50% stenosis. Left carotid system: Patent. There is plaque along the common carotid causing mild stenosis. Primarily calcified plaque at the ICA origin causes less than 50% stenosis. Vertebral arteries: Patent. Left vertebral artery is dominant. Right vertebral artery is congenitally diminutive. Skeleton: Facet arthropathy at C4-C5 and C5-C6. Other neck: No mass or adenopathy. Upper chest: Emphysema. Review of the MIP images confirms the above findings CTA HEAD There is no abnormal vascularity or evidence of active contrast extravasation in the region of hemorrhage. Anterior circulation: Intracranial internal carotid arteries are patent with mild calcified plaque. Anterior and middle cerebral arteries are patent. Posterior circulation: Intracranial vertebral arteries are patent. Right vertebral artery terminates as a PICA. Basilar artery is patent. Posterior cerebral arteries are patent. Venous sinuses: Patent as allowed by contrast bolus timing. Review of the MIP images confirms the above findings IMPRESSION: Increase in size of left cerebral hemorrhage and surrounding edema. Mass effect remains mild. No abnormal vascularity in the region of hemorrhage or evidence of active contrast extravasation. No hemodynamically significant stenosis. These  results were communicated to Dr. Roda Shutters At 1:28 pm on 09/11/2020 by text page via the [facility] messaging system. Electronically Signed   By: Guadlupe Spanish M.D.   On: 09/11/2020 13:29   CT HEAD WO CONTRAST  Result Date: 09/11/2020 CLINICAL DATA:  Parenchymal hemorrhage, follow-up. EXAM: CT HEAD WITHOUT CONTRAST TECHNIQUE: Contiguous axial images were obtained from the base of the skull through the vertex without intravenous contrast. COMPARISON:  09/11/2020 CTA head and neck. 09/11/2020 noncontrast head CT and prior. FINDINGS: Brain: Left cerebral hemorrhage involving the basal ganglia is  slightly increased in size measuring 6.1 x 2.5 x 4.7 cm (3:18, 5:39, previously 5.6 x 2.5 x 4.5 cm). Partial effacement of the left lateral ventricle. Minimal 3 mm rightward midline shift. No new focus of intracranial hemorrhage. Minimal intraventricular extension involving the left occipital horn (3:15), unchanged. Patent basilar cisterns. No new focal hypodensity. Vascular: No hyperdense vessel or unexpected calcification. Bilateral carotid siphon atherosclerotic calcifications. Skull: No acute finding. Sinuses/Orbits: Normal orbits. Paranasal sinuses and mastoid air cells are pneumatized. Other: None. IMPRESSION: Slightly increased size of left cerebral hemorrhage measuring 6.1 cm, previously 5.6 cm. Minimal intraventricular extension involving the left occipital horn, unchanged. Rightward midline shift of 3 mm and partial left lateral ventricle effacement, unchanged. These results will be called to the ordering clinician or representative by the Radiologist Assistant, and communication documented in the PACS or [employer]. Electronically Signed   By: Stana Bunting M.D.   On: 09/11/2020 20:29   CT ANGIO NECK W OR WO CONTRAST  Result Date: 09/11/2020 CLINICAL DATA:  Parenchymal hemorrhage EXAM: CT ANGIOGRAPHY HEAD AND NECK TECHNIQUE: Multidetector CT imaging of the head and neck was performed using the standard  protocol during bolus administration of intravenous contrast. Multiplanar CT image reconstructions and MIPs were obtained to evaluate the vascular anatomy. Carotid stenosis measurements (when applicable) are obtained utilizing NASCET criteria, using the distal internal carotid diameter as the denominator. CONTRAST:  75mL OMNIPAQUE IOHEXOL 350 MG/ML SOLN COMPARISON:  None. FINDINGS: CT HEAD Brain: Increase in size of left cerebral hemorrhage with involvement of basal ganglia, central white matter, and subinsular white matter. Currently measures 5.6 x 2.5 x 4.5 cm (previously 4.2 x 2.4 x 4.5 cm). Surrounding edema is slightly increased as well. There is greater effacement of the left lateral ventricle without midline shift or hydrocephalus. No new loss of gray-white differentiation. Vascular: No new abnormality. Skull: Calvarium is unremarkable. Sinuses/Orbits: No acute finding. Other: None. Review of the MIP images confirms the above findings CTA NECK Aortic arch: Great vessel origins are patent. There is circumferential atherosclerotic thickening at the origins. Right carotid system: Patent. There is plaque along the common carotid causing minimal stenosis. Mixed plaque at the ICA origin causes less than 50% stenosis. Left carotid system: Patent. There is plaque along the common carotid causing mild stenosis. Primarily calcified plaque at the ICA origin causes less than 50% stenosis. Vertebral arteries: Patent. Left vertebral artery is dominant. Right vertebral artery is congenitally diminutive. Skeleton: Facet arthropathy at C4-C5 and C5-C6. Other neck: No mass or adenopathy. Upper chest: Emphysema. Review of the MIP images confirms the above findings CTA HEAD There is no abnormal vascularity or evidence of active contrast extravasation in the region of hemorrhage. Anterior circulation: Intracranial internal carotid arteries are patent with mild calcified plaque. Anterior and middle cerebral arteries are patent.  Posterior circulation: Intracranial vertebral arteries are patent. Right vertebral artery terminates as a PICA. Basilar artery is patent. Posterior cerebral arteries are patent. Venous sinuses: Patent as allowed by contrast bolus timing. Review of the MIP images confirms the above findings IMPRESSION: Increase in size of left cerebral hemorrhage and surrounding edema. Mass effect remains mild. No abnormal vascularity in the region of hemorrhage or evidence of active contrast extravasation. No hemodynamically significant stenosis. These results were communicated to Dr. Roda Shutters At 1:28 pm on 09/11/2020 by text page via the [facility] messaging system. Electronically Signed   By: Guadlupe Spanish M.D.   On: 09/11/2020 13:29   DG CHEST PORT 1 VIEW  Result Date: 09/12/2020  CLINICAL DATA:  49 year old male with history of leukocytosis. EXAM: PORTABLE CHEST 1 VIEW COMPARISON:  Chest x-ray 02/02/2016. FINDINGS: Feeding tube extending into the stomach, with tip below the lower margin of the image. Lung volumes are normal. No consolidative airspace disease. No pleural effusions. No evidence of pulmonary edema. Mild cardiomegaly. No pneumothorax. Upper mediastinal contours are within normal limits. IMPRESSION: 1. No radiographic evidence of acute cardiopulmonary disease. 2. Cardiomegaly. Electronically Signed   By: Trudie Reedaniel  Entrikin M.D.   On: 09/12/2020 15:34   ECHOCARDIOGRAM COMPLETE  Result Date: 09/11/2020    ECHOCARDIOGRAM REPORT   Patient Name:   Vilma PraderGLENN H Bridwell Date of Exam: 09/11/2020 Medical Rec #:  [id number]      Height:       73.5 in Accession #:    [id number]     Weight:       250.9 lb Date of Birth:  [birth year]     BSA:          2.381 m Patient Age:    48 years       BP:           122/77 mmHg Patient Gender: M              HR:           79 bpm. Exam Location:  Inpatient Procedure: 2D Echo, Cardiac Doppler and Color Doppler Indications:    Stroke 434.9/I163.9  History:        Patient has no prior history of  Echocardiogram examinations.  Sonographer:    Roosvelt Maserachel Lane RDCS Referring Phys: [id number] JINDONG XU IMPRESSIONS  1. Left ventricular ejection fraction, by estimation, is 50 to 55%. The left ventricle has low normal function. The left ventricle has no regional wall motion abnormalities. There is mild left ventricular hypertrophy. Left ventricular diastolic parameters are consistent with Grade II diastolic dysfunction (pseudonormalization).  2. Right ventricular systolic function is normal. The right ventricular size is normal.  3. Left atrial size was mildly dilated.  4. The mitral valve is normal in structure. Mild to moderate mitral valve regurgitation. No evidence of mitral stenosis.  5. The aortic valve is normal in structure. Aortic valve regurgitation is not visualized. No aortic stenosis is present.  6. The inferior vena cava is normal in size with greater than 50% respiratory variability, suggesting right atrial pressure of 3 mmHg. Conclusion(s)/Recommendation(s): No intracardiac source of embolism detected on this transthoracic study. A transesophageal echocardiogram is recommended to exclude cardiac source of embolism if clinically indicated. FINDINGS  Left Ventricle: Left ventricular ejection fraction, by estimation, is 50 to 55%. The left ventricle has low normal function. The left ventricle has no regional wall motion abnormalities. The left ventricular internal cavity size was normal in size. There is mild left ventricular hypertrophy. Left ventricular diastolic parameters are consistent with Grade II diastolic dysfunction (pseudonormalization). Right Ventricle: The right ventricular size is normal. No increase in right ventricular wall thickness. Right ventricular systolic function is normal. Left Atrium: Left atrial size was mildly dilated. Right Atrium: Right atrial size was normal in size. Pericardium: There is no evidence of pericardial effusion. Mitral Valve: The mitral valve is normal in structure.  Mild to moderate mitral valve regurgitation. No evidence of mitral valve stenosis. Tricuspid Valve: The tricuspid valve is normal in structure. Tricuspid valve regurgitation is mild . No evidence of tricuspid stenosis. Aortic Valve: The aortic valve is normal in structure. Aortic valve regurgitation is not visualized. No aortic stenosis is present. Pulmonic  Valve: The pulmonic valve was normal in structure. Pulmonic valve regurgitation is not visualized. No evidence of pulmonic stenosis. Aorta: The aortic root is normal in size and structure. Venous: The inferior vena cava is normal in size with greater than 50% respiratory variability, suggesting right atrial pressure of 3 mmHg. IAS/Shunts: No atrial level shunt detected by color flow Doppler.  LEFT VENTRICLE PLAX 2D LVIDd:         5.80 cm Diastology LVIDs:         4.30 cm LV e' lateral: 10.10 cm/s LV PW:         1.20 cm LV IVS:        1.10 cm  RIGHT VENTRICLE          IVC RV Basal diam:  3.70 cm  IVC diam: 2.30 cm LEFT ATRIUM             Index       RIGHT ATRIUM           Index LA diam:        5.00 cm 2.10 cm/m  RA Area:     25.30 cm LA Vol (A2C):   85.9 ml 36.07 ml/m RA Volume:   77.80 ml  32.67 ml/m LA Vol (A4C):   82.4 ml 34.60 ml/m LA Biplane Vol: 88.2 ml 37.04 ml/m  AORTIC VALVE LVOT Vmax:   92.50 cm/s LVOT Vmean:  57.100 cm/s LVOT VTI:    0.166 m  AORTA Ao Root diam: 4.00 cm Ao Asc diam:  3.60 cm  SHUNTS Systemic VTI: 0.17 m Donato Schultz MD Electronically signed by Donato Schultz MD Signature Date/Time: 09/11/2020/3:11:57 PM    Final    CT HEAD CODE STROKE WO CONTRAST  Result Date: 09/11/2020 CLINICAL DATA:  Code stroke. EXAM: CT HEAD WITHOUT CONTRAST TECHNIQUE: Contiguous axial images were obtained from the base of the skull through the vertex without intravenous contrast. COMPARISON:  None. FINDINGS: Brain: Intraparenchymal hematoma in the left basal ganglia measures 4.2 x 2.4 x 4.5 cm (volume = 24 cm^3). Mild surrounding edema. No midline shift. No  hydrocephalus. Brain parenchyma is otherwise normal. Vascular: No abnormal hyperdensity of the major intracranial arteries or dural venous sinuses. No intracranial atherosclerosis. Skull: The visualized skull base, calvarium and extracranial soft tissues are normal. Sinuses/Orbits: No fluid levels or advanced mucosal thickening of the visualized paranasal sinuses. No mastoid or middle ear effusion. The orbits are normal. IMPRESSION: 1. Intraparenchymal hematoma in the left basal ganglia, measuring 24 mL. 2. No midline shift or hydrocephalus. Critical Value/emergent results were called by telephone at the time of interpretation on 09/11/2020 at 5:16 am to provider Marisue Humble, who verbally acknowledged these results. Electronically Signed   By: Deatra Robinson M.D.   On: 09/11/2020 05:16     Labs:   Basic Metabolic Panel: Recent Labs  Lab 09/13/20 1654 09/13/20 2211 09/14/20 0232 09/14/20 0909 09/15/20 0535 09/15/20 0535 09/15/20 0855 09/15/20 1514 09/15/20 2038 09/16/20 0424 09/16/20 0424 09/19/20 0142 09/20/20 0330  NA  --    < > 148*   < > 147*  --    < > 150* 147* 148*  --  149* 148*  K  --    < > 3.3*  --  3.7   < >  --   --   --  3.9   < > 3.7 3.8  CL  --   --  115*  --  115*  --   --   --   --  117*  --  118* 116*  CO2  --   --  21*  --  21*  --   --   --   --  19*  --  21* 21*  GLUCOSE  --   --  126*  --  113*  --   --   --   --  108*  --  158* 150*  BUN  --   --  10  --  17  --   --   --   --  31*  --  46* 42*  CREATININE  --   --  0.76  --  0.83  --   --   --   --  0.92  --  1.09 1.07  CALCIUM  --   --  8.7*  --  9.2  --   --   --   --  9.2  --  8.9 8.9  MG 1.9  --   --   --   --   --   --   --   --  2.4  --  2.3  --   PHOS 3.3  --   --   --   --   --   --   --   --   --   --  4.2  --    < > = values in this interval not displayed.   GFR Estimated Creatinine Clearance: 107.9 mL/min (by C-G formula based on SCr of 1.07 mg/dL). Liver Function Tests: Recent Labs  Lab  09/19/20 0142  AST 46*  ALT 55*  ALKPHOS 62  BILITOT 0.6  PROT 7.0  ALBUMIN 3.2*   No results for input(s): LIPASE, AMYLASE in the last 168 hours. No results for input(s): AMMONIA in the last 168 hours. Coagulation profile No results for input(s): INR, PROTIME in the last 168 hours.  CBC: Recent Labs  Lab 09/14/20 0232 09/15/20 0535 09/16/20 0424 09/19/20 0142  WBC 10.9* 9.3 9.6 8.3  NEUTROABS  --   --   --  5.9  HGB 12.4* 13.4 14.4 13.7  HCT 38.5* 41.1 44.2 43.4  MCV 87.5 87.3 85.8 88.0  PLT 317 291 297 269   Cardiac Enzymes: No results for input(s): CKTOTAL, CKMB, CKMBINDEX, TROPONINI in the last 168 hours. BNP: Invalid input(s): POCBNP CBG: Recent Labs  Lab 09/19/20 1636 09/19/20 2019 09/19/20 2357 09/20/20 0432 09/20/20 0749  GLUCAP 134* 137* 137* 121* 137*   D-Dimer No results for input(s): DDIMER in the last 72 hours. Hgb A1c No results for input(s): HGBA1C in the last 72 hours. Lipid Profile No results for input(s): CHOL, HDL, LDLCALC, TRIG, CHOLHDL, LDLDIRECT in the last 72 hours. Thyroid function studies No results for input(s): TSH, T4TOTAL, T3FREE, THYROIDAB in the last 72 hours.  Invalid input(s): FREET3 Anemia work up No results for input(s): VITAMINB12, FOLATE, FERRITIN, TIBC, IRON, RETICCTPCT in the last 72 hours. Microbiology Recent Results (from the past 240 hour(s))  Respiratory Panel by RT PCR (Flu A&B, Covid) - Nasopharyngeal Swab     Status: None   Collection Time: 09/11/20  8:30 AM   Specimen: Nasopharyngeal Swab  Result Value Ref Range Status   SARS Coronavirus 2 by RT PCR NEGATIVE NEGATIVE Final    Comment: (NOTE) SARS-CoV-2 target nucleic acids are NOT DETECTED.  The SARS-CoV-2 RNA is generally detectable in upper respiratoy specimens during the acute phase of infection. The lowest concentration of SARS-CoV-2 viral copies this assay can detect  is 131 copies/mL. A negative result does not preclude SARS-Cov-2 infection and  should not be used as the sole basis for treatment or other patient management decisions. A negative result may occur with  improper specimen collection/handling, submission of specimen other than nasopharyngeal swab, presence of viral mutation(s) within the areas targeted by this assay, and inadequate number of viral copies (<131 copies/mL). A negative result must be combined with clinical observations, patient history, and epidemiological information. The expected result is Negative.  Fact Sheet for Patients:  https://www.moore.com/  Fact Sheet for Healthcare Providers:  https://www.young.biz/  This test is no t yet approved or cleared by the [place] FDA and  has been authorized for detection and/or diagnosis of SARS-CoV-2 by FDA under an Emergency Use Authorization (EUA). This EUA will remain  in effect (meaning this test can be used) for the duration of the COVID-19 declaration under Section 564(b)(1) of the Act, 21 U.S.C. section 360bbb-3(b)(1), unless the authorization is terminated or revoked sooner.     Influenza A by PCR NEGATIVE NEGATIVE Final   Influenza B by PCR NEGATIVE NEGATIVE Final    Comment: (NOTE) The Xpert Xpress SARS-CoV-2/FLU/RSV assay is intended as an aid in  the diagnosis of influenza from Nasopharyngeal swab specimens and  should not be used as a sole basis for treatment. Nasal washings and  aspirates are unacceptable for Xpert Xpress SARS-CoV-2/FLU/RSV  testing.  Fact Sheet for Patients: https://www.moore.com/  Fact Sheet for Healthcare Providers: https://www.young.biz/  This test is not yet approved or cleared by the [place] FDA and  has been authorized for detection and/or diagnosis of SARS-CoV-2 by  FDA under an Emergency Use Authorization (EUA). This EUA will remain  in effect (meaning this test can be used) for the duration of the  Covid-19 declaration  under Section 564(b)(1) of the Act, 21  U.S.C. section 360bbb-3(b)(1), unless the authorization is  terminated or revoked. Performed at [facility], [address] [place], [place] [address]   MRSA PCR Screening     Status: None   Collection Time: 09/11/20  3:13 PM   Specimen: Nasal Mucosa; Nasopharyngeal  Result Value Ref Range Status   MRSA by PCR NEGATIVE NEGATIVE Final    Comment:        The GeneXpert MRSA Assay (FDA approved for NASAL specimens only), is one component of a comprehensive MRSA colonization surveillance program. It is not intended to diagnose MRSA infection nor to guide or monitor treatment for MRSA infections. Performed at [facility], [address] [place], [place] [address]      Signed: Lorin Glass  [facility] 09/20/2020, 11:01 AM

## 2020-09-20 NOTE — Progress Notes (Signed)
Inpatient Rehabilitation Medication Review by a Pharmacist  A complete drug regimen review was completed for this patient to identify any potential clinically significant medication issues.  Clinically significant medication issues were identified:  no  Check AMION for pharmacist assigned to patient if future medication questions/issues arise during this admission.  Pharmacist comments: It is noted that the patient will eventually need to resume anticoagulation for Afib and secondary CVA prophylaxis as the hemorrhage resolves. Neurology recommended to follow-up with repeat imaging in 4 weeks (~12/4) to further address this.   Time spent performing this drug regimen review (minutes):  15  Thank you for allowing pharmacy to be a part of this patient's care.  Georgina Pillion, PharmD, BCPS Clinical Pharmacist 09/20/2020 4:27 PM

## 2020-09-20 NOTE — Progress Notes (Signed)
Marissa Nestle, PA notified about pts BP. Verbal order given to hold Hydralazine, Labetalol, Lisinopril for the 2000/2200 dose for 09/20/20.

## 2020-09-20 NOTE — TOC Transition Note (Signed)
Transition of Care [facility]) - CM/SW Discharge Note   Patient Details  Name: BANDY HONAKER MRN: [id number] Date of Birth: 06/01/1971  Transition of Care [facility] CM/SW Contact:  Kermit Balo, RN Phone Number: 09/20/2020, 11:56 AM   Clinical Narrative:    Pt discharging to CIR today. CM signing off.   Final next level of care: IP Rehab Facility Barriers to Discharge: No Barriers Identified   Patient Goals and CMS Choice     Choice offered to / list presented to : Patient  Discharge Placement                       Discharge Plan and Services                                     Social Determinants of Health (SDOH) Interventions     Readmission Risk Interventions No flowsheet data found.

## 2020-09-20 NOTE — H&P (Signed)
Physical Medicine and Rehabilitation Admission H&P        Chief Complaint  Patient presents with  . Code Stroke  : HPI: Jason Romero is a 49 year old right-handed male with history of COPD/tobacco abuse with hypertension, history of seizures on no seizure medication, atrial fibrillation not on anticoagulation did not follow-up cardiology services.  Per chart review lives with spouse independent prior to admission.  1 level home with level entry.  Presented 09/11/2020 with right side weakness facial droop slumped over in his chair.  Blood pressure 195/127.  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  No shift or hydrocephalus.  CTA of head and neck no hemodynamically significant stenosis.  Echocardiogram with ejection fraction of 50 to 55% grade 2 diastolic dysfunction.  No regional wall motion abnormalities.  Admission chemistries unremarkable except glucose 114 potassium 2.8 urine drug screen positive amphetamines and marijuana, urinalysis negative nitrite.  Neurology follow-up initially placed on 3% saline.  ICH felt to be related to hypertensive crisis.  Placed on Cleviprex for blood pressure control.  Patient is currently n.p.o. with alternative means of nutritional support.  Therapy evaluations completed and patient was admitted for a comprehensive rehab program.   Review of Systems  Constitutional: Negative for chills and fever.  HENT: Negative for hearing loss.   Eyes: Negative for blurred vision and double vision.  Respiratory: Negative for cough.   Cardiovascular: Positive for palpitations and leg swelling.  Gastrointestinal: Positive for constipation. Negative for heartburn, nausea and vomiting.       GERD  Genitourinary: Negative for dysuria, flank pain and hematuria.  Musculoskeletal: Positive for myalgias.  Skin: Negative for rash.  Neurological: Positive for speech change and weakness.  Psychiatric/Behavioral: Positive  for depression. The patient has insomnia.   All other systems reviewed and are negative.       Past Medical History:  Diagnosis Date  . A-fib (HCC)    . Chronic headaches    . COPD (chronic obstructive pulmonary disease) (HCC)    . GERD (gastroesophageal reflux disease)    . History of blood transfusion    . Hypertension    . MI (myocardial infarction) (HCC)    . Seizures (HCC)           Past Surgical History:  Procedure Laterality Date  . CHOLECYSTECTOMY             Family History  Problem Relation Age of Onset  . Alcoholism Other    . Arthritis Other    . Breast cancer Other    . Heart disease Mother      Social History:  reports that Jason Romero has been smoking. Jason Romero has been smoking about 1.00 pack per day. Jason Romero has never used smokeless tobacco. Jason Romero reports current alcohol use. Jason Romero reports current drug use. Drug: Marijuana. Allergies:       Allergies  Allergen Reactions  . Hydrocodone Hives  . Ibuprofen Other (See Comments)      Pt can't take this medication because it interacts with other medications that Jason Romero is taking.           Medications Prior to Admission  Medication Sig Dispense Refill  . ALPRAZolam (XANAX) 1 MG tablet Take 1 tablet (1 mg total) by mouth 3 (three) times daily. (Patient not taking: Reported on 09/11/2020) 90 tablet 2  . buPROPion (WELLBUTRIN SR) 150 MG 12 hr tablet Take 150 mg (one tablet) by mouth daily for  3 days, then take 150 mg (1 tablet) by mouth twice daily (Patient not taking: Reported on 09/11/2020) 60 tablet 3  . hydrALAZINE (APRESOLINE) 50 MG tablet Take 1 tablet (50 mg total) by mouth 4 (four) times daily. (Patient not taking: Reported on 09/11/2020) 120 tablet 3  . hydrochlorothiazide (HYDRODIURIL) 25 MG tablet Take 1 tablet (25 mg total) by mouth daily. (Patient not taking: Reported on 09/11/2020) 90 tablet 3  . lisinopril (PRINIVIL,ZESTRIL) 40 MG tablet Take 1 tablet (40 mg total) by mouth daily. (Patient not taking: Reported on 09/11/2020) 90  tablet 0      Drug Regimen Review Drug regimen was reviewed and remains appropriate with no significant issues identified   Home: Home Living Family/patient expects to be discharged to:: Private residence Living Arrangements: Spouse/significant other, Other relatives Available Help at Discharge: Family, Available 24 hours/day Type of Home: House Home Access: Level entry Home Layout: One level Bathroom Shower/Tub: Engineer, manufacturing systems: Standard Home Equipment: None  Lives With: Spouse   Functional History: Prior Function Level of Independence: Independent Comments: communicating via head nods, reports Jason Romero doesn't drive or work; does some basic ADL around the home    Functional Status:  Mobility: Bed Mobility Overal bed mobility: Needs Assistance Bed Mobility: Supine to Sit, Sit to Supine Rolling: Min assist, Mod assist Supine to sit: Mod assist Sit to supine: Mod assist General bed mobility comments: Transitioned supine > sit L EOB, cuing pt to manage R LE towards EOB with min activation noted. Pt impulsive to attempt to ascend trunk while neglecting R side, cuing pt to remember to bring R UE with body for safety. ModA to maintain safety due to impulsive movements. Transfers Overall transfer level: Needs assistance Equipment used: Ambulation equipment used Transfer via Lift Equipment: Stedy Transfers: Sit to/from Stand Sit to Stand: Mod assist, +2 physical assistance, +2 safety/equipment Stand pivot transfers: Max assist, +2 physical assistance  Lateral/Scoot Transfers: Total assist, +2 physical assistance General transfer comment: Hand-over-hand assistance to place R hand on blure bar of stedy, cuing pt on sequence of steps to come to stand safely. Pt demonstrated strong R lateral lean upon coming to stand, requiring modAx2 to maintain balance and cue pt to find midline. Attempted mirror anterior to pt to encourage midline alignment, but pt tends to avoid looking in  mirror despite cues.   ADL: ADL Overall ADL's : Needs assistance/impaired Eating/Feeding: NPO Grooming: Wash/dry face, Supervision/safety, Set up, Sitting, Oral care, Brushing hair, Total assistance Grooming Details (indicate cue type and reason): pt able to wash face with LUE from recliner with s/u assist, pt completed oral care with suction with LUE with s/u, but required total A to brush hair Lower Body Dressing: Maximal assistance, +2 for physical assistance, +2 for safety/equipment Lower Body Dressing Details (indicate cue type and reason): pt able to doff socks with mod A for sitting balance  Toilet Transfer: Maximal assistance, Total assistance, +2 for physical assistance, +2 for safety/equipment Toilet Transfer Details (indicate cue type and reason): simulated via functional mobility with stedy and MAX A +2 for sitting balance while seated on stedy seat, pt was able to stand to stedy with MOD A +2 for safety Functional mobility during ADLs: Maximal assistance, Total assistance, +2 for physical assistance General ADL Comments: pt presents with impaired balance, decreased activity tolerance, cognitive deficits  and R sided weakness   Cognition: Cognition Overall Cognitive Status: Difficult to assess Arousal/Alertness: Awake/alert Orientation Level: Oriented to person, Disoriented to place, Disoriented to  time, Oriented to situation Attention: Sustained Sustained Attention: Impaired Sustained Attention Impairment: Verbal basic, Functional basic Awareness: Impaired Awareness Impairment: Intellectual impairment Problem Solving: Impaired Problem Solving Impairment: Verbal basic Cognition Arousal/Alertness: Awake/alert Behavior During Therapy: Restless, Impulsive Overall Cognitive Status: Difficult to assess Area of Impairment: Following commands, Safety/judgement, Awareness, Problem solving Following Commands: Follows one step commands with increased time Safety/Judgement:  Decreased awareness of safety Awareness: Emergent Problem Solving: Difficulty sequencing, Requires verbal cues, Requires tactile cues General Comments: pt continues to be impulsive with all mobility. pt able to choose name when choices provided, when given choices pt reprots liking rock music. pt with impaired safety awareness througout session continuing to present with R sided inattention needing cues to attend to R side Difficult to assess due to: Impaired communication   Physical Exam: Blood pressure 111/90, pulse 70, temperature (!) 97.5 F (36.4 C), temperature source Oral, resp. rate 20, height 6' 1.5" (1.867 m), weight 109.3 kg, SpO2 98 %. Physical Exam Constitutional:      General: Jason Romero is not in acute distress. HENT:     Head:     Comments: Nasogastric tube  in place    Left Ear: External ear normal.     Nose: Nose normal.  Eyes:     Pupils: Pupils are equal, round, and reactive to light.  Cardiovascular:     Rate and Rhythm: Normal rate and regular rhythm.     Pulses: Normal pulses.  Pulmonary:     Effort: Pulmonary effort is normal. No respiratory distress.     Breath sounds: No wheezing, rhonchi or rales.  Abdominal:     General: Bowel sounds are normal.     Palpations: Abdomen is soft.  Musculoskeletal:        General: No swelling or tenderness.     Cervical back: Normal range of motion.     Right lower leg: No edema.     Left lower leg: No edema.  Skin:    General: Skin is warm.  Neurological:     Mental Status: Jason Romero is alert.     Comments: Patient is alert in no acute distress. Jason Romero demonstrates a left gaze preference. Jason Romero is globally aphasic. Mumbles incoherent words and phrases. Jason Romero was occasionally able to utter something such as his first name or thank you. Follows some simple commands. Dense right hemiparesis. LUE and LLE 4/5.  Right central VII, decreased sense of pain in right arm and leg.   Psychiatric:     Comments: Overall pleasant and cooperative         Lab Results Last 48 Hours        Results for orders placed or performed during the hospital encounter of 09/11/20 (from the past 48 hour(s))  Glucose, capillary     Status: None    Collection Time: 09/18/20  1:32 PM  Result Value Ref Range    Glucose-Capillary 99 70 - 99 mg/dL      Comment: Glucose reference range applies only to samples taken after fasting for at least 8 hours.    Comment 1 Notify RN      Comment 2 Document in Chart    Glucose, capillary     Status: Abnormal    Collection Time: 09/18/20  5:05 PM  Result Value Ref Range    Glucose-Capillary 139 (H) 70 - 99 mg/dL      Comment: Glucose reference range applies only to samples taken after fasting for at least 8 hours.  Glucose, capillary  Status: Abnormal    Collection Time: 09/18/20  8:46 PM  Result Value Ref Range    Glucose-Capillary 146 (H) 70 - 99 mg/dL      Comment: Glucose reference range applies only to samples taken after fasting for at least 8 hours.  Glucose, capillary     Status: Abnormal    Collection Time: 09/19/20 12:17 AM  Result Value Ref Range    Glucose-Capillary 137 (H) 70 - 99 mg/dL      Comment: Glucose reference range applies only to samples taken after fasting for at least 8 hours.  CBC with Differential/Platelet     Status: None    Collection Time: 09/19/20  1:42 AM  Result Value Ref Range    WBC 8.3 4.0 - 10.5 K/uL    RBC 4.93 4.22 - 5.81 MIL/uL    Hemoglobin 13.7 13.0 - 17.0 g/dL    HCT 75.1 39 - 52 %    MCV 88.0 80.0 - 100.0 fL    MCH 27.8 26.0 - 34.0 pg    MCHC 31.6 30.0 - 36.0 g/dL    RDW 02.5 85.2 - 77.8 %    Platelets 269 150 - 400 K/uL    nRBC 0.0 0.0 - 0.2 %    Neutrophils Relative % 69 %    Neutro Abs 5.9 1.7 - 7.7 K/uL    Lymphocytes Relative 18 %    Lymphs Abs 1.5 0.7 - 4.0 K/uL    Monocytes Relative 9 %    Monocytes Absolute 0.8 0.1 - 1.0 K/uL    Eosinophils Relative 2 %    Eosinophils Absolute 0.1 0.0 - 0.5 K/uL    Basophils Relative 1 %    Basophils Absolute 0.0  0.0 - 0.1 K/uL    Immature Granulocytes 1 %    Abs Immature Granulocytes 0.05 0.00 - 0.07 K/uL      Comment: Performed at [facility], [address] [place], [place] [address]  Comprehensive metabolic panel     Status: Abnormal    Collection Time: 09/19/20  1:42 AM  Result Value Ref Range    Sodium 149 (H) 135 - 145 mmol/L    Potassium 3.7 3.5 - 5.1 mmol/L    Chloride 118 (H) 98 - 111 mmol/L    CO2 21 (L) 22 - 32 mmol/L    Glucose, Bld 158 (H) 70 - 99 mg/dL      Comment: Glucose reference range applies only to samples taken after fasting for at least 8 hours.    BUN 46 (H) 6 - 20 mg/dL    Creatinine, Ser 3.61 0.61 - 1.24 mg/dL    Calcium 8.9 8.9 - 44.3 mg/dL    Total Protein 7.0 6.5 - 8.1 g/dL    Albumin 3.2 (L) 3.5 - 5.0 g/dL    AST 46 (H) 15 - 41 U/L    ALT 55 (H) 0 - 44 U/L    Alkaline Phosphatase 62 38 - 126 U/L    Total Bilirubin 0.6 0.3 - 1.2 mg/dL    GFR, Estimated >15 >40 mL/min      Comment: (NOTE) Calculated using the CKD-EPI Creatinine Equation (2021)      Anion gap 10 5 - 15      Comment: Performed at [facility], [address] [place], [place] [address]  Phosphorus     Status: None    Collection Time: 09/19/20  1:42 AM  Result Value Ref Range    Phosphorus  4.2 2.5 - 4.6 mg/dL      Comment: Performed at [facility], [address] [place], [place] [address]  Magnesium     Status: None    Collection Time: 09/19/20  1:42 AM  Result Value Ref Range    Magnesium 2.3 1.7 - 2.4 mg/dL      Comment: Performed at [facility], [address] [place], [place] [address]  Glucose, capillary     Status: Abnormal    Collection Time: 09/19/20  4:57 AM  Result Value Ref Range    Glucose-Capillary 104 (H) 70 - 99 mg/dL      Comment: Glucose reference range applies only to samples taken after fasting for at least 8 hours.  Glucose, capillary     Status: Abnormal    Collection Time: 09/19/20  7:41 AM  Result Value Ref Range    Glucose-Capillary  157 (H) 70 - 99 mg/dL      Comment: Glucose reference range applies only to samples taken after fasting for at least 8 hours.  Glucose, capillary     Status: Abnormal    Collection Time: 09/19/20 11:30 AM  Result Value Ref Range    Glucose-Capillary 131 (H) 70 - 99 mg/dL      Comment: Glucose reference range applies only to samples taken after fasting for at least 8 hours.  Glucose, capillary     Status: Abnormal    Collection Time: 09/19/20  4:36 PM  Result Value Ref Range    Glucose-Capillary 134 (H) 70 - 99 mg/dL      Comment: Glucose reference range applies only to samples taken after fasting for at least 8 hours.  Glucose, capillary     Status: Abnormal    Collection Time: 09/19/20  8:19 PM  Result Value Ref Range    Glucose-Capillary 137 (H) 70 - 99 mg/dL      Comment: Glucose reference range applies only to samples taken after fasting for at least 8 hours.  Glucose, capillary     Status: Abnormal    Collection Time: 09/19/20 11:57 PM  Result Value Ref Range    Glucose-Capillary 137 (H) 70 - 99 mg/dL      Comment: Glucose reference range applies only to samples taken after fasting for at least 8 hours.  Basic metabolic panel     Status: Abnormal    Collection Time: 09/20/20  3:30 AM  Result Value Ref Range    Sodium 148 (H) 135 - 145 mmol/L    Potassium 3.8 3.5 - 5.1 mmol/L    Chloride 116 (H) 98 - 111 mmol/L    CO2 21 (L) 22 - 32 mmol/L    Glucose, Bld 150 (H) 70 - 99 mg/dL      Comment: Glucose reference range applies only to samples taken after fasting for at least 8 hours.    BUN 42 (H) 6 - 20 mg/dL    Creatinine, Ser 0.98 0.61 - 1.24 mg/dL    Calcium 8.9 8.9 - 11.9 mg/dL    GFR, Estimated >14 >78 mL/min      Comment: (NOTE) Calculated using the CKD-EPI Creatinine Equation (2021)      Anion gap 11 5 - 15      Comment: Performed at [facility], [address], [place], [place] [address]  Glucose, capillary     Status: Abnormal    Collection Time: 09/20/20   4:32 AM  Result Value Ref Range  Glucose-Capillary 121 (H) 70 - 99 mg/dL      Comment: Glucose reference range applies only to samples taken after fasting for at least 8 hours.  Glucose, capillary     Status: Abnormal    Collection Time: 09/20/20  7:49 AM  Result Value Ref Range    Glucose-Capillary 137 (H) 70 - 99 mg/dL      Comment: Glucose reference range applies only to samples taken after fasting for at least 8 hours.    Comment 1 Notify RN      Comment 2 Document in Chart        Imaging Results (Last 48 hours)  No results found.           Medical Problem List and Plan: 1.  Right side weakness secondary to left basal ganglia ICH related to hypertensive crisis             -patient may shower             -ELOS/Goals: 3-4 weeks, min assist with PT, OT, mod assist with SLP 2.  Antithrombotics: -DVT/anticoagulation: SCDs             -antiplatelet therapy: N/A 3. Pain Management: Tylenol as needed 4. Mood: Provide emotional support             -antipsychotic agents: N/A 5. Neuropsych: This patient is not capable of making decisions on his own behalf. 6. Skin/Wound Care: Routine skin checks 7. Fluids/Electrolytes/Nutrition: Routine in and outs with follow-up chemistries 8.  Dysphagia.  NPO.  Alternative means of nutritional support.  Speech therapy follow-up 9.  Hypertension.  Norvasc 10 mg daily, hydralazine 100 mg every 8 hours, Normodyne 300 mg every 8 hours, lisinopril 20 mg twice daily.  Monitor with increased mobility 10.  History of tobacco abuse as well as marijuana use.  Urine drug screen positive marijuana.  Provide counseling 11.  Obesity.  BMI 30.67.  Dietary follow-up           Charlton AmorDaniel J Angiulli, PA-C 09/20/2020   I have personally performed a face to face diagnostic evaluation of this patient and formulated the key components of the plan.  Additionally, I have personally reviewed laboratory data, imaging studies, as well as relevant notes and concur with the  physician assistant's documentation above.  The patient's status has not changed from the original H&P.  Any changes in documentation from the acute care chart have been noted above.  Ranelle OysterZachary T. Machi Whittaker, MD, [place] DomFAAPMR

## 2020-09-20 NOTE — PMR Pre-admission (Signed)
PMR Admission Coordinator Pre-Admission Assessment  Patient: Jason Romero is an 49 y.o., male MRN: [id number] DOB: [birth year] Height: 6' 1.5" (186.7 cm) Weight: 109.3 kg              Insurance Information  PRIMARY: uninsured     First source involved with disability and Information systems manager:       Phone#:   The Engineer, materials Information Summary" for patients in Inpatient Rehabilitation Facilities with attached "Privacy Act Statement-Health Care Records" was provided and verbally reviewed with: N/A  Emergency Contact Information Contact Information    Name Relation Home Work Mobile   [place] Spouse [phone]  [phone]   [facility] Mother [phone]     Inez Pilgrim   [phone]     Current Medical History  Patient Admitting Diagnosis: ICH  History of Present Illness:. [name] is a 49 year old right-handed male with history of COPD/tobacco abuse with hypertension, history of seizures on no seizure medication, atrial fibrillation not on anticoagulation did not follow-up cardiology services.  Per chart review lives with spouse independent prior to admission.  1 level home with level entry.  Presented 09/11/2020 with right side weakness facial droop slumped over in his chair.  Blood pressure 195/127.  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  No shift or hydrocephalus.  CTA of head and neck no hemodynamically significant stenosis.  Echocardiogram with ejection fraction of 50 to 55% grade 2 diastolic dysfunction.  No regional wall motion abnormalities.  Admission chemistries unremarkable except glucose 114 potassium 2.8 urine drug screen positive amphetamines and marijuana, urinalysis negative nitrite.  Neurology follow-up initially placed on 3% saline.  ICH felt to be related to hypertensive crisis.  Placed on Cleviprex for blood pressure control.  Patient is currently n.p.o. with  alternative means of nutritional support. Hyper natremia treatment with free water via CORTRAK.  Complete NIHSS TOTAL: 18 Glasgow Coma Scale Score: 14  Past Medical History  Past Medical History:  Diagnosis Date  . A-fib (HCC)   . Chronic headaches   . COPD (chronic obstructive pulmonary disease) (HCC)   . GERD (gastroesophageal reflux disease)   . History of blood transfusion   . Hypertension   . MI (myocardial infarction) (HCC)   . Seizures (HCC)     Family History  family history includes Alcoholism in an other family member; Arthritis in an other family member; Breast cancer in an other family member; Heart disease in his mother.  Prior Rehab/Hospitalizations:  Has the patient had prior rehab or hospitalizations prior to admission? Yes  Has the patient had major surgery during 100 days prior to admission? No  Current Medications   Current Facility-Administered Medications:  .  acetaminophen (TYLENOL) tablet 650 mg, 650 mg, Oral, Q4H PRN **OR** acetaminophen (TYLENOL) 160 MG/5ML solution 650 mg, 650 mg, Per Tube, Q4H PRN, 650 mg at 09/13/20 1653 **OR** acetaminophen (TYLENOL) suppository 650 mg, 650 mg, Rectal, Q4H PRN, Rica Mote, MD .  amLODipine (NORVASC) tablet 10 mg, 10 mg, Per Tube, Daily, Marvel Plan, MD, 10 mg at 09/20/20 1046 .  chlorhexidine (PERIDEX) 0.12 % solution 15 mL, 15 mL, Mouth Rinse, BID, Marvel Plan, MD, 15 mL at 09/19/20 2132 .  Chlorhexidine Gluconate Cloth 2 % PADS 6 each, 6 each, Topical, Daily, Marvel Plan, MD, 6 each at 09/19/20 [phone] .  feeding supplement (OSMOLITE 1.5 CAL) liquid 1,000 mL, 1,000 mL, Per Tube, Continuous, Marvel Plan, MD, Last Rate: 60 mL/hr  at 09/20/20 0926, 1,000 mL at 09/20/20 0926 .  feeding supplement (PROSource TF) liquid 45 mL, 45 mL, Per Tube, TID, Marvel Plan, MD, 45 mL at 09/20/20 1046 .  free water 200 mL, 200 mL, Per Tube, Q4H, Dahal, Binaya, MD, 200 mL at 09/20/20 0939 .  hydrALAZINE (APRESOLINE) tablet 100 mg, 100  mg, Per Tube, Q8H, Marvel Plan, MD, 100 mg at 09/20/20 0600 .  labetalol (NORMODYNE) injection 10-20 mg, 10-20 mg, Intravenous, Q2H PRN, Marvel Plan, MD, 20 mg at 09/16/20 1752 .  labetalol (NORMODYNE) tablet 300 mg, 300 mg, Per Tube, Q8H, Marvel Plan, MD, 300 mg at 09/20/20 0600 .  lisinopril (ZESTRIL) tablet 20 mg, 20 mg, Per Tube, BID, Marvel Plan, MD, 20 mg at 09/20/20 1047 .  MEDLINE mouth rinse, 15 mL, Mouth Rinse, q12n4p, Marvel Plan, MD, 15 mL at 09/19/20 1535 .  pantoprazole sodium (PROTONIX) 40 mg/20 mL oral suspension 40 mg, 40 mg, Per Tube, Daily, Marvel Plan, MD, 40 mg at 09/20/20 1046 .  sennosides (SENOKOT) 8.8 MG/5ML syrup 5 mL, 5 mL, Per Tube, BID, Marvel Plan, MD, 5 mL at 09/20/20 1046  Patients Current Diet:  Diet Order            Diet - low sodium heart healthy           Diet NPO time specified  Diet effective now                 Precautions / Restrictions Precautions Precautions: Fall Precaution Comments: R hemiplegia, R inattention, contraversive pushing Restrictions Weight Bearing Restrictions: No   Has the patient had 2 or more falls or a fall with injury in the past year?No  Prior Activity Level Community (5-7x/wk): Independent  Prior Functional Level Prior Function Level of Independence: Independent Comments: communicating via head nods, reports he doesn't drive or work; does some basic ADL around the home   Self Care: Did the patient need help bathing, dressing, using the toilet or eating?  Independent  Indoor Mobility: Did the patient need assistance with walking from room to room (with or without device)? Independent  Stairs: Did the patient need assistance with internal or external stairs (with or without device)? Independent  Functional Cognition: Did the patient need help planning regular tasks such as shopping or remembering to take medications? Independent  Home Assistive Devices / Equipment Home Assistive Devices/Equipment: None Home  Equipment: None  Prior Device Use: Indicate devices/aids used by the patient prior to current illness, exacerbation or injury? None of the above  Current Functional Level Cognition  Arousal/Alertness: Awake/alert Overall Cognitive Status: Difficult to assess Difficult to assess due to: Impaired communication Orientation Level: Oriented to person, Disoriented to place, Disoriented to time, Oriented to situation Following Commands: Follows one step commands with increased time Safety/Judgement: Decreased awareness of safety General Comments: pt continues to be impulsive with all mobility. pt able to choose name when choices provided, when given choices pt reprots liking rock music. pt with impaired safety awareness througout session continuing to present with R sided inattention needing cues to attend to R side Attention: Sustained Sustained Attention: Impaired Sustained Attention Impairment: Verbal basic, Functional basic Awareness: Impaired Awareness Impairment: Intellectual impairment Problem Solving: Impaired Problem Solving Impairment: Verbal basic    Extremity Assessment (includes Sensation/Coordination)  Upper Extremity Assessment: RUE deficits/detail RUE Deficits / Details: Rt UE with no active movement - appears flaccid  RUE Sensation: decreased proprioception RUE Coordination: decreased fine motor, decreased gross motor  Lower Extremity Assessment:  Defer to PT evaluation RLE Deficits / Details: 0/5 throughout extremity RLE Sensation: decreased light touch    ADLs  Overall ADL's : Needs assistance/impaired Eating/Feeding: NPO Grooming: Wash/dry face, Supervision/safety, Set up, Oral care, Bed level Grooming Details (indicate cue type and reason): from bed level, pt able to use suction for oral care with LUE Upper Body Dressing : Total assistance, Bed level Upper Body Dressing Details (indicate cue type and reason): to don new gown from bed level Lower Body Dressing:  Maximal assistance, +2 for physical assistance, +2 for safety/equipment Lower Body Dressing Details (indicate cue type and reason): pt able to doff socks with mod A for sitting balance  Toilet Transfer: Maximal assistance, Total assistance, +2 for physical assistance, +2 for safety/equipment Toilet Transfer Details (indicate cue type and reason): deferred OOB mobility this session as pt transferring to CIR soon , session focus on EOB sitting balance neurored from EOB Toileting- Clothing Manipulation and Hygiene: Total assistance, Bed level Toileting - Clothing Manipulation Details (indicate cue type and reason): for posterior pericare from  bed level Functional mobility during ADLs: Moderate assistance, +2 for physical assistance (bed mobility and sitting balance only) General ADL Comments: pt attempting to communicating verbally today. wife present during session, very supportive and helpful. session focus on sitting balance EOB, bed mobility and neurored    Mobility  Overal bed mobility: Needs Assistance Bed Mobility: Supine to Sit, Sit to Supine Rolling: Min assist, Mod assist Supine to sit: Mod assist Sit to supine: Mod assist General bed mobility comments: Transitioned supine > sit L EOB, cuing pt to manage R LE towards EOB with min activation noted. Pt impulsive to attempt to ascend trunk while neglecting R side, cuing pt to remember to bring R UE with body for safety. ModA to maintain safety due to impulsive movements.    Transfers  Overall transfer level: Needs assistance Equipment used: Ambulation equipment used Transfer via Lift Equipment: Stedy Transfers: Sit to/from Stand Sit to Stand: Mod assist, +2 physical assistance, +2 safety/equipment Stand pivot transfers: Max assist, +2 physical assistance  Lateral/Scoot Transfers: Total assist, +2 physical assistance General transfer comment: Hand-over-hand assistance to place R hand on blure bar of stedy, cuing pt on sequence of steps  to come to stand safely. Pt demonstrated strong R lateral lean upon coming to stand, requiring modAx2 to maintain balance and cue pt to find midline. Attempted mirror anterior to pt to encourage midline alignment, but pt tends to avoid looking in mirror despite cues.    Ambulation / Gait / Stairs / Engineer, drilling / Balance Dynamic Sitting Balance Sitting balance - Comments: Pt impulsive and tends to lean to the L or lose balance anteriorly when cued to sit upright. Attempted placing mirror anterior to pt but pt would avoid looking in mirror when cued. Cued pt to place weight through R UE and to maintain midline through placing B elbows on B thighs.  Balance Overall balance assessment: Needs assistance Sitting-balance support: Feet supported, Bilateral upper extremity supported Sitting balance-Leahy Scale: Poor Sitting balance - Comments: Pt impulsive and tends to lean to the L or lose balance anteriorly when cued to sit upright. Attempted placing mirror anterior to pt but pt would avoid looking in mirror when cued. Cued pt to place weight through R UE and to maintain midline through placing B elbows on B thighs.  Postural control: Left lateral lean Standing balance support: Bilateral upper extremity supported Standing balance-Leahy Scale: Poor  Standing balance comment: ModAx2 with intermittent maxA to avoid LOB with pt standing in stedy x2 bouts of ~30 sec each. Demonstrated strong R lateral lean, requiring cues and mirror anterior to pt to attempt to find midline, with min success.    Special needs/care consideration Cortrak 43 inches left nare placed on 11/1   Previous Home Environment  Living Arrangements: Spouse/significant other, Other relatives  Lives With: Spouse Available Help at Discharge: Family, Available 24 hours/day Type of Home: House Home Layout: One level Home Access: Level entry Bathroom Shower/Tub: Engineer, manufacturing systemsTub/shower unit Bathroom Toilet: Standard Bathroom  Accessibility: Yes How Accessible: Accessible via walker Home Care Services: No  Discharge Living Setting Plans for Discharge Living Setting: Patient's home, Lives with (comment) (wife) Type of Home at Discharge: House Discharge Home Layout: One level Discharge Home Access: Level entry Discharge Bathroom Shower/Tub: Tub/shower unit Discharge Bathroom Toilet: Standard Discharge Bathroom Accessibility: Yes How Accessible: Accessible via walker Does the patient have any problems obtaining your medications?: Yes (Describe) (uninsured)  Paediatric nurseocial/Family/Support Systems Contact Information: wife, Natalia LeatherwoodKatherine and pt's sister Anticipated Caregiver: wife, pt sister Anticipated Industrial/product designerCaregiver's Contact Information: see above Ability/Limitations of Caregiver: wife has COPD; [place] Caregiver Availability: 24/7 Discharge Plan Discussed with Primary Caregiver: Yes Is Caregiver In Agreement with Plan?: Yes Does Caregiver/Family have Issues with Lodging/Transportation while Pt is in Rehab?: No  Goals Patient/Family Goal for Rehab: Min asisst with PT, OT, and SLP Expected length of stay: ELOS 3 to 4 weeks Pt/Family Agrees to Admission and willing to participate: Yes Program Orientation Provided & Reviewed with Pt/Caregiver Including Roles  & Responsibilities: Yes  Decrease burden of Care through IP rehab admission: n/a  Possible need for SNF placement upon discharge:not anticipated  Patient Condition: This patient's medical and functional status has changed since the consult dated: 09/13/2020 in which the Rehabilitation Physician determined and documented that the patient's condition is appropriate for intensive rehabilitative care in an inpatient rehabilitation facility. See "History of Present Illness" (above) for medical update. Functional changes are: overall mod assist. Patient's medical and functional status update has been discussed with the Rehabilitation physician and patient  remains appropriate for inpatient rehabilitation. Will admit to inpatient rehab today.  Preadmission Screen Completed By:  Clois DupesBoyette, Faustina Gebert Godwin, RN, 09/20/2020 11:21 AM ______________________________________________________________________   Discussed status with Dr. Riley KillSwartz on 09/20/2020 at  1125 and received approval for admission today.  Admission Coordinator:  Clois DupesBoyette, Gladis Soley Godwin, time [id number] Date 09/20/2020

## 2020-09-20 NOTE — Progress Notes (Signed)
Inpatient Rehabilitation Admissions Coordinator  I have CIr bed aviaalble to admit pt to today. I contacted Dr. Pietro Cassis and have medcial clearnace to d/c to [place] today. I met with patient and his wife at bedside and they are in agreement. I will make the arrangements today to admit.  Danne Baxter, RN, MSN Rehab Admissions Coordinator [phone] 09/20/2020 11:07 AM

## 2020-09-20 NOTE — Progress Notes (Signed)
Physical Therapy Treatment Patient Details Name: Jason Romero MRN: [id number] DOB: [birth year] Today's Date: 09/20/2020    History of Present Illness 49 yo male admitted to [facility] on 10/31 after being found by wife slumped in bathroom with R deficits. CT head reveals intraparenchymal hematoma in L BG, repeat head CT shows increased cerebral hemorrhage with R midline shift 3 mm. PMH includes afib, COPD, GERD, HTN, MI.    PT Comments    Pt seen in conjunction with OT to maximize pts activity tolerance and safety. Focused session on neuromuscular re-education via lateral leans from EOB to pt's RUE, attempting carry-over with simultaneous B LE LAQs, and facilitating midline trunk alignment in sitting. Pt required up to [facility] for sitting balance but pt did have moments of static sitting with min guard assist. Pt continues to be impulsive with mobility needing modA +2 for bed mobility. Pt demonstrates L lateral trunk flexion in sitting, resulting in him requiring TCs at ribs and assistance to extend his L trunk and flex his R and maintain this position. Pt continues to display no muscle activation with attempted R triceps push-ups from R sidelying in bed and with attempted R LE LAQ. Pt continues to present with decreased activity tolerance, endurance, balance, coordination, R sided weakness, and impaired safety awareness impacting pts independence and safety with all functional mobility. D/C plan remains appropriate, will follow acutely per POC.   Follow Up Recommendations  CIR;Supervision/Assistance - 24 hour     Equipment Recommendations  Other (comment) (TBD)    Recommendations for Other Services       Precautions / Restrictions Precautions Precautions: Fall Precaution Comments: R hemiplegia, R inattention, contraversive pushing Restrictions Weight Bearing Restrictions: No    Mobility  Bed Mobility Overal bed mobility: Needs Assistance Bed Mobility: Supine to Sit;Sit to  Supine;Rolling Rolling: Min assist;Mod assist (minA to roll to R, modA to roll to L)   Supine to sit: Mod assist;+2 for physical assistance Sit to supine: Mod assist;+2 for physical assistance   General bed mobility comments: Assistance and cues provided at R LE and R UE during all bed mobility, with no activation noted. Pt abloe to roll to R with minA but required modA to roll to L. ModA to maintain R sidelying onto R elbow.  Transfers                 General transfer comment: Focused session on neuro re-ed sitting EOB  Ambulation/Gait                 Stairs             Wheelchair Mobility    Modified Rankin (Stroke Patients Only) Modified Rankin (Stroke Patients Only) Pre-Morbid Rankin Score: No symptoms Modified Rankin: Severe disability     Balance Overall balance assessment: Needs assistance Sitting-balance support: Feet supported;Bilateral upper extremity supported Sitting balance-Leahy Scale: Poor Sitting balance - Comments: Pt able to maintain static sitting balance with bouts of only min guard assist but at times required up to modA due to impulsivity to reach with L UE. Cues provided at ribs to extend L lateral trunk and flex R lateral trunk, with improved midline positioning noted. Cued pt to lean laterally onto R elbow, modA to maintain position. Postural control: Left lateral lean                                  Cognition Arousal/Alertness: Awake/alert  Behavior During Therapy: Restless;Impulsive;WFL for tasks assessed/performed (bouts of restlessness/impulsivity secondary to frustration) Overall Cognitive Status: Difficult to assess Area of Impairment: Following commands;Safety/judgement;Awareness;Problem solving                       Following Commands: Follows one step commands with increased time Safety/Judgement: Decreased awareness of safety Awareness: Emergent Problem Solving: Difficulty sequencing;Requires  verbal cues;Requires tactile cues General Comments: pt continues to be slightly impulsive with mobility. pt noted to attempt to say "nice to meet you" and responds with "yes or no" appropriately. pt requires verbal and tactile cues to sequence mobility tasks, continued decreased safety awareness noted during session with pt impulsively kicking LLE onto chair      Exercises General Exercises - Upper Extremity Elbow Extension: AAROM;Strengthening;Right;Sidelying;Other reps (comment) (2 reps with cues at triceps to push up, no activation noted) General Exercises - Lower Extremity Long Arc Quad: Strengthening;Both;10 reps;Seated (attempted B simultaneously, no activation noted R with AAROM) Other Exercises Other Exercises: continued education on PROM to RUE with wife stating she has been assisting pt with ROM    General Comments General comments (skin integrity, edema, etc.): pts wife present during session very supportive and helpful      Pertinent Vitals/Pain Pain Assessment: No/denies pain Pain Intervention(s): Limited activity within patient's tolerance;Monitored during session    Home Living                      Prior Function            PT Goals (current goals can now be found in the care plan section) Acute Rehab PT Goals Patient Stated Goal: none explicitly stated PT Goal Formulation: With patient/family Time For Goal Achievement: 09/27/20 Potential to Achieve Goals: Good Progress towards PT goals: Progressing toward goals    Frequency    Min 4X/week      PT Plan Current plan remains appropriate    Co-evaluation PT/OT/SLP Co-Evaluation/Treatment: Yes Reason for Co-Treatment: Complexity of the patient's impairments (multi-system involvement);Necessary to address cognition/behavior during functional activity;For patient/therapist safety PT goals addressed during session: Balance;Strengthening/ROM OT goals addressed during session: ADL's and self-care       AM-PAC PT "6 Clicks" Mobility   Outcome Measure  Help needed turning from your back to your side while in a flat bed without using bedrails?: A Lot Help needed moving from lying on your back to sitting on the side of a flat bed without using bedrails?: A Lot Help needed moving to and from a bed to a chair (including a wheelchair)?: A Lot Help needed standing up from a chair using your arms (e.g., wheelchair or bedside chair)?: A Lot Help needed to walk in hospital room?: Total Help needed climbing 3-5 steps with a railing? : Total 6 Click Score: 10    End of Session   Activity Tolerance: Patient limited by fatigue;Patient tolerated treatment well Patient left: with call bell/phone within reach;in bed;with bed alarm set;with family/visitor present (wife present) Nurse Communication: Mobility status PT Visit Diagnosis: Hemiplegia and hemiparesis;Muscle weakness (generalized) (M62.81);Unsteadiness on feet (R26.81);Difficulty in walking, not elsewhere classified (R26.2);Other symptoms and signs involving the nervous system (R29.898) Hemiplegia - Right/Left: Right Hemiplegia - dominant/non-dominant: Non-dominant Hemiplegia - caused by: Nontraumatic intracerebral hemorrhage     Time: 5456-2563 PT Time Calculation (min) (ACUTE ONLY): 24 min  Charges:  $Neuromuscular Re-education: 8-22 mins  Raymond Gurney, PT, DPT Acute Rehabilitation Services  Pager: [phone] Office: [phone]    Jewel Baize 09/20/2020, 1:17 PM

## 2020-09-20 NOTE — Progress Notes (Signed)
Genice Rouge, MD  Physician  Physical Medicine and Rehabilitation  Consult Note      Signed  Date of Service:  09/13/2020  1:05 PM      Related encounter: ED to Hosp-Admission (Current) from 09/11/2020 in [place] [facility]      Signed      Expand All Collapse All  Show:Clear all [x] Manual[x] Template[] Copied  Added by: [x] Angiulli, , PA-C[x] Lovorn, , MD  [] Hover for details          Physical Medicine and Rehabilitation Consult Reason for Consult: Right side weakness with facial droop Referring Physician: Dr.Xu     HPI: Jason Romero is a 49 y.o. right-handed male with history of COPD/tobacco abuse, hypertension, atrial fibrillation not on anticoagulation patient did not follow-up with cardiology services.  Per chart review patient lives with spouse independent prior to admission.  1 level home with level entry.  Presented 09/11/2020 with right side weakness and facial droop slumped over in chair..  Blood pressure 195/127.  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  No shift or hydrocephalus.  CTA of head and neck no hemodynamically significant stenosis.  Echocardiogram with ejection fraction of 50 to 55% grade 2 diastolic dysfunction.  No regional wall motion abnormalities.  Admission chemistries unremarkable except potassium 2.8, glucose 114, urine drug screen positive amphetamines and marijuana, urinalysis negative nitrite.  Neurology follow-up patient was initially placed on 3% saline.  ICH felt to be related to hypertensive crisis.  Placed on Cleviprex for blood pressure control.  Currently n.p.o. with alternative means of nutritional support.  Therapy evaluations completed with recommendations of physical medicine rehab consult.   Pt frustrated and upset- but kept repeating himself- hard ot determine his concerns at this time.     Review of Systems  Constitutional: Negative for chills and fever.    HENT: Negative for hearing loss.   Eyes: Negative for blurred vision and double vision.  Respiratory: Negative for cough and shortness of breath.   Cardiovascular: Positive for palpitations and leg swelling. Negative for chest pain.  Gastrointestinal: Positive for constipation. Negative for heartburn, nausea and vomiting.  Genitourinary: Negative for dysuria, flank pain and hematuria.  Musculoskeletal: Positive for myalgias.  Skin: Negative for rash.  Neurological: Positive for speech change and weakness.  All other systems reviewed and are negative.       Past Medical History:  Diagnosis Date  . A-fib (HCC)    . Chronic headaches    . COPD (chronic obstructive pulmonary disease) (HCC)    . GERD (gastroesophageal reflux disease)    . History of blood transfusion    . Hypertension    . MI (myocardial infarction) (HCC)    . Seizures (HCC)           Past Surgical History:  Procedure Laterality Date  . CHOLECYSTECTOMY             Family History  Problem Relation Age of Onset  . Alcoholism Other    . Arthritis Other    . Breast cancer Other    . Heart disease Mother      Social History:  reports that he has been smoking. He has been smoking about 1.00 pack per day. He has never used smokeless tobacco. He reports current alcohol use. He reports that he does not use drugs. Allergies:       Allergies  Allergen Reactions  . Hydrocodone Hives  . Ibuprofen Other (See  Comments)      Pt can't take this medication because it interacts with other medications that he is taking.           Medications Prior to Admission  Medication Sig Dispense Refill  . ALPRAZolam (XANAX) 1 MG tablet Take 1 tablet (1 mg total) by mouth 3 (three) times daily. (Patient not taking: Reported on 09/11/2020) 90 tablet 2  . buPROPion (WELLBUTRIN SR) 150 MG 12 hr tablet Take 150 mg (one tablet) by mouth daily for 3 days, then take 150 mg (1 tablet) by mouth twice daily (Patient not taking: Reported on  09/11/2020) 60 tablet 3  . hydrALAZINE (APRESOLINE) 50 MG tablet Take 1 tablet (50 mg total) by mouth 4 (four) times daily. (Patient not taking: Reported on 09/11/2020) 120 tablet 3  . hydrochlorothiazide (HYDRODIURIL) 25 MG tablet Take 1 tablet (25 mg total) by mouth daily. (Patient not taking: Reported on 09/11/2020) 90 tablet 3  . lisinopril (PRINIVIL,ZESTRIL) 40 MG tablet Take 1 tablet (40 mg total) by mouth daily. (Patient not taking: Reported on 09/11/2020) 90 tablet 0      Home: Home Living Family/patient expects to be discharged to:: Private residence Living Arrangements: Spouse/significant other, Other relatives Available Help at Discharge: Family, Available 24 hours/day Type of Home: House Home Access: Level entry Home Layout: One level Bathroom Shower/Tub: Engineer, manufacturing systems: Standard Home Equipment: None  Functional History: Prior Function Level of Independence: Independent Functional Status:  Mobility: Bed Mobility Overal bed mobility: Needs Assistance Bed Mobility: Rolling, Sit to Supine, Supine to Sit Rolling: Min assist, Max assist Supine to sit: Max assist, +2 for physical assistance, +2 for safety/equipment, HOB elevated Sit to supine: Max assist, +2 for physical assistance, +2 for safety/equipment, HOB elevated General bed mobility comments: min assist for roll to R for completion of trunk translation, pt with good LUE reaching towards opposite handrail when cued. Max assist for roll to L for all aspects. Max +2 supine<>sit for trunk and LE management, scooting to and from EOB, boost up in bed upon return to supine. Pt assisting with LUE/LE Transfers General transfer comment: unable this day   ADL:   Cognition: Cognition Overall Cognitive Status: Difficult to assess Orientation Level: Other (comment) Cognition Arousal/Alertness: Awake/alert Behavior During Therapy: Restless Overall Cognitive Status: Difficult to assess General Comments:  Follows commands on L side, limited by incoordination. Answers yes/no questions appropriately, A&O x3 when given answer choices to pick from (are you in [employer]? Or are you in the hospital). Difficult to assess due to: Impaired communication (expressive aphasic)   Blood pressure (!) 184/87, pulse 85, temperature (!) 97.5 F (36.4 C), temperature source Oral, resp. rate (!) 27, height 6' 1.5" (1.867 m), weight 113.8 kg, SpO2 94 %. Physical Exam Vitals and nursing note reviewed.  Constitutional:      Comments: Pt awake, gaze Left, but perseverating on trying to Get OOB while I was there- kept pulling at line, but didn't pull any out- frustrated, kept sighing, and did say was unhappy, on monitor, NAD  HENT:     Head: Atraumatic.     Comments: L R facial droop-at rest, wouldn't/couldn't smile or stick out tongue; had Cortrak in place; no O2 on    Right Ear: External ear normal.     Left Ear: External ear normal.     Nose: Nose normal. No congestion.     Mouth/Throat:     Mouth: Mucous membranes are dry.     Pharynx: Oropharynx  is clear. No oropharyngeal exudate.  Eyes:     General:        Right eye: No discharge.        Left eye: No discharge.     Comments: Severe L gaze deviation- wasn't even able to track me to midline- unable to see if nystagmus seen due to pt ability to follow commands  Cardiovascular:     Rate and Rhythm: Normal rate. Rhythm irregular.     Heart sounds: Normal heart sounds. No murmur heard.   Pulmonary:     Comments: Somewhat coarse- mainly upper airway sounds- good air movement- decreased at bases B/L Abdominal:     Comments: Soft, NT, ND, (+)BS    Musculoskeletal:     Cervical back: Normal range of motion. No rigidity.     Comments: Moving L side spontaneously, and pulling at lines- didn't pull anything out- jerking movements- no movement seen on R even with painful stimuli  Skin:    Comments: L forearm IV- dried blood on it- L antecubital fossa IV- looks  good   Neurological:     Comments: Patient is lethargic.  Left gaze preference.  Patient is aphasic.  Opens eyes to verbal stimuli but would fall back to sleep.  He did not follow commands.  Aphasic, however even though frustrated, did say "can't feel" when asked about sensation on R side- decreased vs absent to light touch  Psychiatric:     Comments: Frustrated/agitated, but fell asleep easily after I left        Lab Results Last 24 Hours       Results for orders placed or performed during the hospital encounter of 09/11/20 (from the past 24 hour(s))  Sodium     Status: None    Collection Time: 09/12/20  3:04 PM  Result Value Ref Range    Sodium 144 135 - 145 mmol/L  Magnesium     Status: None    Collection Time: 09/12/20  6:50 PM  Result Value Ref Range    Magnesium 2.0 1.7 - 2.4 mg/dL  Phosphorus     Status: Abnormal    Collection Time: 09/12/20  6:50 PM  Result Value Ref Range    Phosphorus 1.7 (L) 2.5 - 4.6 mg/dL  Sodium     Status: None    Collection Time: 09/12/20  8:39 PM  Result Value Ref Range    Sodium 145 135 - 145 mmol/L  CBC     Status: Abnormal    Collection Time: 09/13/20  4:47 AM  Result Value Ref Range    WBC 13.8 (H) 4.0 - 10.5 K/uL    RBC 4.53 4.22 - 5.81 MIL/uL    Hemoglobin 12.9 (L) 13.0 - 17.0 g/dL    HCT 40.939.4 39 - 52 %    MCV 87.0 80.0 - 100.0 fL    MCH 28.5 26.0 - 34.0 pg    MCHC 32.7 30.0 - 36.0 g/dL    RDW 81.115.5 91.411.5 - 78.215.5 %    Platelets 330 150 - 400 K/uL    nRBC 0.0 0.0 - 0.2 %  Basic metabolic panel     Status: Abnormal    Collection Time: 09/13/20  4:47 AM  Result Value Ref Range    Sodium 147 (H) 135 - 145 mmol/L    Potassium 3.1 (L) 3.5 - 5.1 mmol/L    Chloride 115 (H) 98 - 111 mmol/L    CO2 21 (L) 22 - 32 mmol/L  Glucose, Bld 161 (H) 70 - 99 mg/dL    BUN 14 6 - 20 mg/dL    Creatinine, Ser 4.48 0.61 - 1.24 mg/dL    Calcium 8.8 (L) 8.9 - 10.3 mg/dL    GFR, Estimated >18 >56 mL/min    Anion gap 11 5 - 15  Magnesium     Status:  None    Collection Time: 09/13/20  4:47 AM  Result Value Ref Range    Magnesium 2.0 1.7 - 2.4 mg/dL  Phosphorus     Status: Abnormal    Collection Time: 09/13/20  4:47 AM  Result Value Ref Range    Phosphorus 1.7 (L) 2.5 - 4.6 mg/dL  Glucose, capillary     Status: Abnormal    Collection Time: 09/13/20  8:20 AM  Result Value Ref Range    Glucose-Capillary 162 (H) 70 - 99 mg/dL  Sodium     Status: Abnormal    Collection Time: 09/13/20  9:19 AM  Result Value Ref Range    Sodium 149 (H) 135 - 145 mmol/L  Glucose, capillary     Status: Abnormal    Collection Time: 09/13/20 11:55 AM  Result Value Ref Range    Glucose-Capillary 115 (H) 70 - 99 mg/dL       Imaging Results (Last 48 hours)  CT HEAD WO CONTRAST   Result Date: 09/13/2020 CLINICAL DATA:  Follow-up parenchymal hemorrhage EXAM: CT HEAD WITHOUT CONTRAST TECHNIQUE: Contiguous axial images were obtained from the base of the skull through the vertex without intravenous contrast. COMPARISON:  Head CT from 3 days ago FINDINGS: Brain: Parenchymal hemorrhage centered at the left external capsule and adjacent white matter tracks, 5.5 x 2.6 cm on axial slices, non progressed. The rim of edema is more pronounced but no progressive mass effect. Midline shift is trace. No visible cortex infarct. Trace clot is again seen at the left occipital horn, with no ventriculomegaly. Vascular: Negative Skull: Normal. Negative for fracture or focal lesion. Sinuses/Orbits: Negative IMPRESSION: Nonprogressive left cerebral hematoma.No hydrocephalus. Electronically Signed   By: Marnee Spring M.D.   On: 09/13/2020 06:52    CT HEAD WO CONTRAST   Result Date: 09/11/2020 CLINICAL DATA:  Parenchymal hemorrhage, follow-up. EXAM: CT HEAD WITHOUT CONTRAST TECHNIQUE: Contiguous axial images were obtained from the base of the skull through the vertex without intravenous contrast. COMPARISON:  09/11/2020 CTA head and neck. 09/11/2020 noncontrast head CT and prior.  FINDINGS: Brain: Left cerebral hemorrhage involving the basal ganglia is slightly increased in size measuring 6.1 x 2.5 x 4.7 cm (3:18, 5:39, previously 5.6 x 2.5 x 4.5 cm). Partial effacement of the left lateral ventricle. Minimal 3 mm rightward midline shift. No new focus of intracranial hemorrhage. Minimal intraventricular extension involving the left occipital horn (3:15), unchanged. Patent basilar cisterns. No new focal hypodensity. Vascular: No hyperdense vessel or unexpected calcification. Bilateral carotid siphon atherosclerotic calcifications. Skull: No acute finding. Sinuses/Orbits: Normal orbits. Paranasal sinuses and mastoid air cells are pneumatized. Other: None. IMPRESSION: Slightly increased size of left cerebral hemorrhage measuring 6.1 cm, previously 5.6 cm. Minimal intraventricular extension involving the left occipital horn, unchanged. Rightward midline shift of 3 mm and partial left lateral ventricle effacement, unchanged. These results will be called to the ordering clinician or representative by the Radiologist Assistant, and communication documented in the PACS or [employer]. Electronically Signed   By: Stana Bunting M.D.   On: 09/11/2020 20:29    DG CHEST PORT 1 VIEW   Result Date: 09/12/2020 CLINICAL  DATA:  49 year old male with history of leukocytosis. EXAM: PORTABLE CHEST 1 VIEW COMPARISON:  Chest x-ray 02/02/2016. FINDINGS: Feeding tube extending into the stomach, with tip below the lower margin of the image. Lung volumes are normal. No consolidative airspace disease. No pleural effusions. No evidence of pulmonary edema. Mild cardiomegaly. No pneumothorax. Upper mediastinal contours are within normal limits. IMPRESSION: 1. No radiographic evidence of acute cardiopulmonary disease. 2. Cardiomegaly. Electronically Signed   By: Trudie Reed M.D.   On: 09/12/2020 15:34    ECHOCARDIOGRAM COMPLETE   Result Date: 09/11/2020    ECHOCARDIOGRAM REPORT   Patient Name:    Jason Romero Date of Exam: 09/11/2020 Medical Rec #:  [id number]      Height:       73.5 in Accession #:    [id number]     Weight:       250.9 lb Date of Birth:  03/08/1971     BSA:          2.381 m Patient Age:    48 years       BP:           122/77 mmHg Patient Gender: M              HR:           79 bpm. Exam Location:  Inpatient Procedure: 2D Echo, Cardiac Doppler and Color Doppler Indications:    Stroke 434.9/I163.9  History:        Patient has no prior history of Echocardiogram examinations.  Sonographer:    Roosvelt Maser RDCS Referring Phys: [id number] JINDONG XU IMPRESSIONS  1. Left ventricular ejection fraction, by estimation, is 50 to 55%. The left ventricle has low normal function. The left ventricle has no regional wall motion abnormalities. There is mild left ventricular hypertrophy. Left ventricular diastolic parameters are consistent with Grade II diastolic dysfunction (pseudonormalization).  2. Right ventricular systolic function is normal. The right ventricular size is normal.  3. Left atrial size was mildly dilated.  4. The mitral valve is normal in structure. Mild to moderate mitral valve regurgitation. No evidence of mitral stenosis.  5. The aortic valve is normal in structure. Aortic valve regurgitation is not visualized. No aortic stenosis is present.  6. The inferior vena cava is normal in size with greater than 50% respiratory variability, suggesting right atrial pressure of 3 mmHg. Conclusion(s)/Recommendation(s): No intracardiac source of embolism detected on this transthoracic study. A transesophageal echocardiogram is recommended to exclude cardiac source of embolism if clinically indicated. FINDINGS  Left Ventricle: Left ventricular ejection fraction, by estimation, is 50 to 55%. The left ventricle has low normal function. The left ventricle has no regional wall motion abnormalities. The left ventricular internal cavity size was normal in size. There is mild left ventricular hypertrophy.  Left ventricular diastolic parameters are consistent with Grade II diastolic dysfunction (pseudonormalization). Right Ventricle: The right ventricular size is normal. No increase in right ventricular wall thickness. Right ventricular systolic function is normal. Left Atrium: Left atrial size was mildly dilated. Right Atrium: Right atrial size was normal in size. Pericardium: There is no evidence of pericardial effusion. Mitral Valve: The mitral valve is normal in structure. Mild to moderate mitral valve regurgitation. No evidence of mitral valve stenosis. Tricuspid Valve: The tricuspid valve is normal in structure. Tricuspid valve regurgitation is mild . No evidence of tricuspid stenosis. Aortic Valve: The aortic valve is normal in structure. Aortic valve regurgitation is not visualized. No aortic stenosis is present.  Pulmonic Valve: The pulmonic valve was normal in structure. Pulmonic valve regurgitation is not visualized. No evidence of pulmonic stenosis. Aorta: The aortic root is normal in size and structure. Venous: The inferior vena cava is normal in size with greater than 50% respiratory variability, suggesting right atrial pressure of 3 mmHg. IAS/Shunts: No atrial level shunt detected by color flow Doppler.  LEFT VENTRICLE PLAX 2D LVIDd:         5.80 cm Diastology LVIDs:         4.30 cm LV e' lateral: 10.10 cm/s LV PW:         1.20 cm LV IVS:        1.10 cm  RIGHT VENTRICLE          IVC RV Basal diam:  3.70 cm  IVC diam: 2.30 cm LEFT ATRIUM             Index       RIGHT ATRIUM           Index LA diam:        5.00 cm 2.10 cm/m  RA Area:     25.30 cm LA Vol (A2C):   85.9 ml 36.07 ml/m RA Volume:   77.80 ml  32.67 ml/m LA Vol (A4C):   82.4 ml 34.60 ml/m LA Biplane Vol: 88.2 ml 37.04 ml/m  AORTIC VALVE LVOT Vmax:   92.50 cm/s LVOT Vmean:  57.100 cm/s LVOT VTI:    0.166 m  AORTA Ao Root diam: 4.00 cm Ao Asc diam:  3.60 cm  SHUNTS Systemic VTI: 0.17 m MarkDonato SchultzMD Electronically signed by Donato Schultz MD  Signature Date/Time: 09/11/2020/3:11:57 PM    Final          Assessment/Plan: 1. 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 Does the need for close, 24 hr/day medical supervision in concert with the patient's rehab needs make it unreasonable for this patient to be served in a less intensive setting? Yes 3. Co-Morbidities requiring supervision/potential complications: substance abuse, uncontrolled HTN, Afib- wasn't on blood thinners, 2ppd 4. Due to bladder management, bowel management, safety, skin/wound care, disease management, medication administration, pain management and patient education, does the patient require 24 hr/day rehab nursing? Potentially 5. Does the patient require coordinated care of a physician, rehab nurse, therapy disciplines of PT, OT and SLP to address physical and functional deficits in the context of the above medical diagnosis(es)? Yes Addressing deficits in the following areas: balance, endurance, locomotion, strength, transferring, bowel/bladder control, bathing, dressing, feeding, grooming, toileting, cognition, speech, language, swallowing and psychosocial support 6. Can the patient actively participate in an intensive therapy program of at least 3 hrs of therapy per day at least 5 days per week? Potentially 7. The potential for patient to make measurable gains while on inpatient rehab is good and fair 8. Anticipated functional outcomes upon discharge from inpatient rehab are min assist  with PT, min assist with OT, min assist and mod assist with SLP. 9. Estimated rehab length of stay to reach the above functional goals is: 3-4 weeks 10. Anticipated discharge destination: Home 11. Overall Rehab/Functional Prognosis: good and fair   RECOMMENDATIONS: This patient's condition is appropriate for continued rehabilitative care in the following setting: CIR Patient has agreed to participate in recommended program.  Potentially Note that insurance prior authorization may be required for reimbursement for recommended care.   Comment: 1. Pt might benefit from Amantadine once come off Cleviprex  100 mg daily 2.  Set up room to push him to look more right 3. Will continue to monitor and submit for admissions coordinators to see/hopefully admit.  4. Thank you for this consult.          Mcarthur Rossetti Angiulli, PA-C 09/13/2020      I have personally performed a face to face diagnostic evaluation of this patient and formulated the key components of the plan.  Additionally, I have personally reviewed laboratory data, imaging studies, as well as relevant notes and concur with the physician assistant's documentation above.            Revision History                     Routing History           Note Details  Waymon Amato, MD File Time 09/13/2020  8:20 PM  Author Type Physician Status Signed  Last Editor Genice Rouge, MD Service Physical Medicine and Rehabilitation

## 2020-09-20 NOTE — Progress Notes (Signed)
Occupational Therapy Treatment Patient Details Name: Jason Romero MRN: [id number] DOB: [birth year] Today's Date: 09/20/2020    History of present illness 49 yo male admitted to [facility] on 10/31 after being found by wife slumped in bathroom with R deficits. CT head reveals intraparenchymal hematoma in L BG, repeat head CT shows increased cerebral hemorrhage with R midline shift 3 mm. PMH includes afib, COPD, GERD, HTN, MI.   OT comments  Pt seen in conjunction with PT to maximize pts activity tolerance. Session focus on bed mobility, sitting balance EOB and neuromuscular re-education via lateral leans from EOB to pts RUE. Pt required up to MOD A for sitting balance but pt did have moments of static sitting with min guard assist. Pt continues to be impulsive with mobility needing MOD A +2 for bed mobility. Worked on lateral leans onto pts RUE with pt needing MOD A to maintain balance. Continued education to pts and pts wife on continued PROM to pts RUE with family verbalizing understanding.  Pt continues to present with decreased activity tolerance, R sided weakness and impaired safety awareness impacting pts ability to complete BADLs. Pt would continue to benefit from skilled occupational therapy while admitted and after d/c to address the below listed limitations in order to improve overall functional mobility and facilitate independence with BADL participation. DC plan remains appropriate, will follow acutely per POC.    Follow Up Recommendations  CIR    Equipment Recommendations  None recommended by OT    Recommendations for Other Services      Precautions / Restrictions Precautions Precautions: Fall Precaution Comments: R hemiplegia, R inattention, contraversive pushing Restrictions Weight Bearing Restrictions: No       Mobility Bed Mobility Overal bed mobility: Needs Assistance Bed Mobility: Supine to Sit;Sit to Supine Rolling: Min assist;Mod assist (MIN to roll to pts R side and  use LUE to stabilize; however pt required MOD A to roll to pts L side and maintain sidelying position for posterior pericare)   Supine to sit: Mod assist;+2 for physical assistance Sit to supine: Mod assist;+2 for physical assistance   General bed mobility comments: pt required assist to maneuver RLE during all bed mobility tasks, MOD A +2 to elevate trunk into sitting from sidelying position and lower trunk back to supine  Transfers                 General transfer comment: session focus on sitting balance EOB    Balance Overall balance assessment: Needs assistance Sitting-balance support: Feet supported;Bilateral upper extremity supported Sitting balance-Leahy Scale: Poor Sitting balance - Comments: pt required up to MOD A +1 for static sitting but did have moments of min guard with BUEs supported. worked on [employer] to pts RUE to facilitate neuromuscular re-education with pt needing up to MOD A to maintain lateral lean                                   ADL either performed or assessed with clinical judgement   ADL Overall ADL's : Needs assistance/impaired     Grooming: Wash/dry face;Supervision/safety;Set up;Oral care;Bed level Grooming Details (indicate cue type and reason): from bed level, pt able to use suction for oral care with LUE         Upper Body Dressing : Total assistance;Bed level Upper Body Dressing Details (indicate cue type and reason): to don new gown from bed level  Toilet Transfer Details (indicate cue type and reason): deferred OOB mobility this session as pt transferring to CIR soon , session focus on EOB sitting balance neurored from EOB Toileting- Clothing Manipulation and Hygiene: Total assistance;Bed level Toileting - Clothing Manipulation Details (indicate cue type and reason): for posterior pericare from  bed level     Functional mobility during ADLs: Moderate assistance;+2 for physical assistance (bed mobility and  sitting balance only) General ADL Comments: pt attempting to communicating verbally today. wife present during session, very supportive and helpful. session focus on sitting balance EOB, bed mobility and neurored     Vision   Vision Assessment?: Vision impaired- to be further tested in functional context Additional Comments: continues to present with L gaze preference but did track to R when cued   Perception     Praxis      Cognition Arousal/Alertness: Awake/alert Behavior During Therapy: Restless;Impulsive;WFL for tasks assessed/performed (at times moments of restlessness and impulsivity secondary to frustration but overall WFL) Overall Cognitive Status: Difficult to assess Area of Impairment: Following commands;Safety/judgement;Awareness;Problem solving                       Following Commands: Follows one step commands with increased time Safety/Judgement: Decreased awareness of safety Awareness: Emergent Problem Solving: Difficulty sequencing;Requires verbal cues;Requires tactile cues General Comments: pt continues to be slightly impulsive with mobility. pt noted to attempt to say "nice to meet you" and responds with "yes or no" appropriately. pt requires verbal and tactile cues to sequence mobility tasks, continued decreased safety awareness noted during session with pt impulsively kicking LLE onto chair        Exercises Other Exercises Other Exercises: continued education on PROM to RUE with wife stating she has been assisting pt with ROM   Shoulder Instructions       General Comments pts wife present during session very supportive and helpful    Pertinent Vitals/ Pain       Pain Assessment: No/denies pain  Home Living                                      Prior Functioning/Environment              Frequency  Min 2X/week        Progress Toward Goals  OT Goals(current goals can now be found in the care plan section)  Progress  towards OT goals: Progressing toward goals  Acute Rehab OT Goals Patient Stated Goal: none explicitly stated OT Goal Formulation: Patient unable to participate in goal setting Time For Goal Achievement: 09/27/20 Potential to Achieve Goals: Good  Plan Discharge plan remains appropriate;Frequency remains appropriate    Co-evaluation    PT/OT/SLP Co-Evaluation/Treatment: Yes Reason for Co-Treatment: Complexity of the patient's impairments (multi-system involvement);For patient/therapist safety;To address functional/ADL transfers   OT goals addressed during session: ADL's and self-care      AM-PAC OT "6 Clicks" Daily Activity     Outcome Measure   Help from another person eating meals?: Total (NPO) Help from another person taking care of personal grooming?: A Lot Help from another person toileting, which includes using toliet, bedpan, or urinal?: Total Help from another person bathing (including washing, rinsing, drying)?: A Lot Help from another person to put on and taking off regular upper body clothing?: A Lot Help from another person to put on and taking off regular lower  body clothing?: Total 6 Click Score: 9    End of Session    OT Visit Diagnosis: Hemiplegia and hemiparesis;Cognitive communication deficit (R41.841) Symptoms and signs involving cognitive functions: Cerebral infarction Hemiplegia - Right/Left: Right Hemiplegia - dominant/non-dominant: Non-Dominant Hemiplegia - caused by: Nontraumatic intracerebral hemorrhage   Activity Tolerance Patient tolerated treatment well   Patient Left in bed;with call bell/phone within reach;with bed alarm set   Nurse Communication Mobility status;Other (comment) (going to CIR later today)        Time: 3846-6599 OT Time Calculation (min): 25 min  Charges: OT General Charges $OT Visit: 1 Visit OT Treatments $Therapeutic Activity: 8-22 mins  Audery Amel., COTA/L Acute Rehabilitation  Services [phone]    Angelina Pih 09/20/2020, 11:28 AM

## 2020-09-20 NOTE — Progress Notes (Signed)
Stephenson Cichy G, RN  Rehab Admission Coordinator  Physical Medicine and Rehabilitation  PMR Pre-admission      Signed  Date of Service:  09/20/2020 11:21 AM      Related encounter: ED to Hosp-Admission (Current) from 09/11/2020 in [place] [facility]      Signed       Show:Clear all Manual[x] Template[x] Copied  Added by: Standley Brooking, RN  Hover for details PMR Admission Coordinator Pre-Admission Assessment   Patient: Jason Romero is an 49 y.o., male MRN: [id number] DOB: Dec 30, 1970 Height: 6' 1.5" (186.7 cm) Weight: 109.3 kg                                                                                                                                                  Insurance Information   PRIMARY: uninsured     First source involved with disability and Psychiatrist:       Phone#:    The Engineer, materials Information Summary" for patients in Inpatient Rehabilitation Facilities with attached "Privacy Act Statement-Health Care Records" was provided and verbally reviewed with: N/A   Emergency Contact Information         Contact Information     Name Relation Home Work Mobile    [place] Spouse [phone]   [phone]    [facility] Mother [phone]        Inez Pilgrim     [phone]       Current Medical History  Patient Admitting Diagnosis: ICH   History of Present Illness:. Jason Romero is a 49 year old right-handed male with history of COPD/tobacco abuse with hypertension, history of seizures on no seizure medication, atrial fibrillation not on anticoagulation did not follow-up cardiology services.  Per chart review lives with spouse independent prior to admission.  1 level home with level entry.  Presented 09/11/2020 with right side weakness facial droop slumped over in his chair.  Blood pressure 195/127.  Cranial CT scan showed intraparenchymal hematoma in the left basal ganglia  measuring 24 mL.  No shift or hydrocephalus.  CTA of head and neck no hemodynamically significant stenosis.  Echocardiogram with ejection fraction of 50 to 55% grade 2 diastolic dysfunction.  No regional wall motion abnormalities.  Admission chemistries unremarkable except glucose 114 potassium 2.8 urine drug screen positive amphetamines and marijuana, urinalysis negative nitrite.  Neurology follow-up initially placed on 3% saline.  ICH felt to be related to hypertensive crisis.  Placed on Cleviprex for blood pressStandley Brookingatient is currently n.p.o. with alternative means of nutritional support. Hyper natremia treatment with free water via CORTRAK.   Complete NIHSS TOTAL: 18 Glasgow Coma Scale Score: 14   Past Medical History      Past Medical History:  Diagnosis Date  . A-fib (HCC)    . Chronic headaches    . COPD (  chronic obstructive pulmonary disease) (HCC)    . GERD (gastroesophageal reflux disease)    . History of blood transfusion    . Hypertension    . MI (myocardial infarction) (HCC)    . Seizures (HCC)        Family History  family history includes Alcoholism in an other family member; Arthritis in an other family member; Breast cancer in an other family member; Heart disease in his mother.   Prior Rehab/Hospitalizations:  Has the patient had prior rehab or hospitalizations prior to admission? Yes   Has the patient had major surgery during 100 days prior to admission? No   Current Medications    Current Facility-Administered Medications:  .  acetaminophen (TYLENOL) tablet 650 mg, 650 mg, Oral, Q4H PRN **OR** acetaminophen (TYLENOL) 160 MG/5ML solution 650 mg, 650 mg, Per Tube, Q4H PRN, 650 mg at 09/13/20 1653 **OR** acetaminophen (TYLENOL) suppository 650 mg, 650 mg, Rectal, Q4H PRN, Rica Moteollins, Hunter J, MD .  amLODipine (NORVASC) tablet 10 mg, 10 mg, Per Tube, Daily, Marvel PlanXu, Jindong, MD, 10 mg at 09/20/20 1046 .  chlorhexidine (PERIDEX) 0.12 % solution 15 mL, 15 mL, Mouth  Rinse, BID, Marvel PlanXu, Jindong, MD, 15 mL at 09/19/20 2132 .  Chlorhexidine Gluconate Cloth 2 % PADS 6 each, 6 each, Topical, Daily, Marvel PlanXu, Jindong, MD, 6 each at 09/19/20 [phone] .  feeding supplement (OSMOLITE 1.5 CAL) liquid 1,000 mL, 1,000 mL, Per Tube, Continuous, Marvel PlanXu, Jindong, MD, Last Rate: 60 mL/hr at 09/20/20 0926, 1,000 mL at 09/20/20 0926 .  feeding supplement (PROSource TF) liquid 45 mL, 45 mL, Per Tube, TID, Marvel PlanXu, Jindong, MD, 45 mL at 09/20/20 1046 .  free water 200 mL, 200 mL, Per Tube, Q4H, Dahal, Binaya, MD, 200 mL at 09/20/20 0939 .  hydrALAZINE (APRESOLINE) tablet 100 mg, 100 mg, Per Tube, Q8H, Marvel PlanXu, Jindong, MD, 100 mg at 09/20/20 0600 .  labetalol (NORMODYNE) injection 10-20 mg, 10-20 mg, Intravenous, Q2H PRN, Marvel PlanXu, Jindong, MD, 20 mg at 09/16/20 1752 .  labetalol (NORMODYNE) tablet 300 mg, 300 mg, Per Tube, Q8H, Marvel PlanXu, Jindong, MD, 300 mg at 09/20/20 0600 .  lisinopril (ZESTRIL) tablet 20 mg, 20 mg, Per Tube, BID, Marvel PlanXu, Jindong, MD, 20 mg at 09/20/20 1047 .  MEDLINE mouth rinse, 15 mL, Mouth Rinse, q12n4p, Marvel PlanXu, Jindong, MD, 15 mL at 09/19/20 1535 .  pantoprazole sodium (PROTONIX) 40 mg/20 mL oral suspension 40 mg, 40 mg, Per Tube, Daily, Marvel PlanXu, Jindong, MD, 40 mg at 09/20/20 1046 .  sennosides (SENOKOT) 8.8 MG/5ML syrup 5 mL, 5 mL, Per Tube, BID, Marvel PlanXu, Jindong, MD, 5 mL at 09/20/20 1046   Patients Current Diet:     Diet Order                      Diet - low sodium heart healthy              Diet NPO time specified  Diet effective now                      Precautions / Restrictions Precautions Precautions: Fall Precaution Comments: R hemiplegia, R inattention, contraversive pushing Restrictions Weight Bearing Restrictions: No    Has the patient had 2 or more falls or a fall with injury in the past year?No   Prior Activity Level Community (5-7x/wk): Independent   Prior Functional Level Prior Function Level of Independence: Independent Comments: communicating via head nods, reports he  doesn't drive or work; does some basic  ADL around the home    Self Care: Did the patient need help bathing, dressing, using the toilet or eating?  Independent   Indoor Mobility: Did the patient need assistance with walking from room to room (with or without device)? Independent   Stairs: Did the patient need assistance with internal or external stairs (with or without device)? Independent   Functional Cognition: Did the patient need help planning regular tasks such as shopping or remembering to take medications? Independent   Home Assistive Devices / Equipment Home Assistive Devices/Equipment: None Home Equipment: None   Prior Device Use: Indicate devices/aids used by the patient prior to current illness, exacerbation or injury? None of the above   Current Functional Level Cognition   Arousal/Alertness: Awake/alert Overall Cognitive Status: Difficult to assess Difficult to assess due to: Impaired communication Orientation Level: Oriented to person, Disoriented to place, Disoriented to time, Oriented to situation Following Commands: Follows one step commands with increased time Safety/Judgement: Decreased awareness of safety General Comments: pt continues to be impulsive with all mobility. pt able to choose name when choices provided, when given choices pt reprots liking rock music. pt with impaired safety awareness througout session continuing to present with R sided inattention needing cues to attend to R side Attention: Sustained Sustained Attention: Impaired Sustained Attention Impairment: Verbal basic, Functional basic Awareness: Impaired Awareness Impairment: Intellectual impairment Problem Solving: Impaired Problem Solving Impairment: Verbal basic    Extremity Assessment (includes Sensation/Coordination)   Upper Extremity Assessment: RUE deficits/detail RUE Deficits / Details: Rt UE with no active movement - appears flaccid  RUE Sensation: decreased proprioception RUE  Coordination: decreased fine motor, decreased gross motor  Lower Extremity Assessment: Defer to PT evaluation RLE Deficits / Details: 0/5 throughout extremity RLE Sensation: decreased light touch     ADLs   Overall ADL's : Needs assistance/impaired Eating/Feeding: NPO Grooming: Wash/dry face, Supervision/safety, Set up, Oral care, Bed level Grooming Details (indicate cue type and reason): from bed level, pt able to use suction for oral care with LUE Upper Body Dressing : Total assistance, Bed level Upper Body Dressing Details (indicate cue type and reason): to don new gown from bed level Lower Body Dressing: Maximal assistance, +2 for physical assistance, +2 for safety/equipment Lower Body Dressing Details (indicate cue type and reason): pt able to doff socks with mod A for sitting balance  Toilet Transfer: Maximal assistance, Total assistance, +2 for physical assistance, +2 for safety/equipment Toilet Transfer Details (indicate cue type and reason): deferred OOB mobility this session as pt transferring to CIR soon , session focus on EOB sitting balance neurored from EOB Toileting- Clothing Manipulation and Hygiene: Total assistance, Bed level Toileting - Clothing Manipulation Details (indicate cue type and reason): for posterior pericare from  bed level Functional mobility during ADLs: Moderate assistance, +2 for physical assistance (bed mobility and sitting balance only) General ADL Comments: pt attempting to communicating verbally today. wife present during session, very supportive and helpful. session focus on sitting balance EOB, bed mobility and neurored     Mobility   Overal bed mobility: Needs Assistance Bed Mobility: Supine to Sit, Sit to Supine Rolling: Min assist, Mod assist Supine to sit: Mod assist Sit to supine: Mod assist General bed mobility comments: Transitioned supine > sit L EOB, cuing pt to manage R LE towards EOB with min activation noted. Pt impulsive to attempt to  ascend trunk while neglecting R side, cuing pt to remember to bring R UE with body for safety. ModA to maintain safety  due to impulsive movements.     Transfers   Overall transfer level: Needs assistance Equipment used: Ambulation equipment used Transfer via Lift Equipment: Stedy Transfers: Sit to/from Stand Sit to Stand: Mod assist, +2 physical assistance, +2 safety/equipment Stand pivot transfers: Max assist, +2 physical assistance  Lateral/Scoot Transfers: Total assist, +2 physical assistance General transfer comment: Hand-over-hand assistance to place R hand on blure bar of stedy, cuing pt on sequence of steps to come to stand safely. Pt demonstrated strong R lateral lean upon coming to stand, requiring modAx2 to maintain balance and cue pt to find midline. Attempted mirror anterior to pt to encourage midline alignment, but pt tends to avoid looking in mirror despite cues.     Ambulation / Gait / Stairs / Clinical biochemist / Balance Dynamic Sitting Balance Sitting balance - Comments: Pt impulsive and tends to lean to the L or lose balance anteriorly when cued to sit upright. Attempted placing mirror anterior to pt but pt would avoid looking in mirror when cued. Cued pt to place weight through R UE and to maintain midline through placing B elbows on B thighs.  Balance Overall balance assessment: Needs assistance Sitting-balance support: Feet supported, Bilateral upper extremity supported Sitting balance-Leahy Scale: Poor Sitting balance - Comments: Pt impulsive and tends to lean to the L or lose balance anteriorly when cued to sit upright. Attempted placing mirror anterior to pt but pt would avoid looking in mirror when cued. Cued pt to place weight through R UE and to maintain midline through placing B elbows on B thighs.  Postural control: Left lateral lean Standing balance support: Bilateral upper extremity supported Standing balance-Leahy Scale: Poor Standing balance  comment: ModAx2 with intermittent maxA to avoid LOB with pt standing in stedy x2 bouts of ~30 sec each. Demonstrated strong R lateral lean, requiring cues and mirror anterior to pt to attempt to find midline, with min success.     Special needs/care consideration Cortrak 43 inches left nare placed on 11/1    Previous Home Environment  Living Arrangements: Spouse/significant other, Other relatives  Lives With: Spouse Available Help at Discharge: Family, Available 24 hours/day Type of Home: House Home Layout: One level Home Access: Level entry Bathroom Shower/Tub: Engineer, manufacturing systems: Standard Bathroom Accessibility: Yes How Accessible: Accessible via walker Home Care Services: No   Discharge Living Setting Plans for Discharge Living Setting: Patient's home, Lives with (comment) (wife) Type of Home at Discharge: House Discharge Home Layout: One level Discharge Home Access: Level entry Discharge Bathroom Shower/Tub: Tub/shower unit Discharge Bathroom Toilet: Standard Discharge Bathroom Accessibility: Yes How Accessible: Accessible via walker Does the patient have any problems obtaining your medications?: Yes (Describe) (uninsured)   Paediatric nurse Information: wife, [name] and pt's sister Anticipated Caregiver: wife, pt sister Anticipated Industrial/product designer Information: see above Ability/Limitations of Caregiver: wife has COPD; sister in from [facility] Caregiver Availability: 24/7 Discharge Plan Discussed with Primary Caregiver: Yes Is Caregiver In Agreement with Plan?: Yes Does Caregiver/Family have Issues with Lodging/Transportation while Pt is in Rehab?: No   Goals Patient/Family Goal for Rehab: Min asisst with PT, OT, and SLP Expected length of stay: ELOS 3 to 4 weeks Pt/Family Agrees to Admission and willing to participate: Yes Program Orientation Provided & Reviewed with Pt/Caregiver Including Roles  & Responsibilities: Yes     Decrease burden of Care through IP rehab admission: n/a   Possible need for SNF placement upon discharge:not anticipated  Patient Condition: This patient's medical and functional status has changed since the consult dated: 09/13/2020 in which the Rehabilitation Physician determined and documented that the patient's condition is appropriate for intensive rehabilitative care in an inpatient rehabilitation facility. See "History of Present Illness" (above) for medical update. Functional changes are: overall mod assist. Patient's medical and functional status update has been discussed with the Rehabilitation physician and patient remains appropriate for inpatient rehabilitation. Will admit to inpatient rehab today.   Preadmission Screen Completed By:  Clois Dupes, RN, 09/20/2020 11:21 AM ______________________________________________________________________   Discussed status with Dr. Riley Kill on 09/20/2020 at  1125 and received approval for admission today.   Admission Coordinator:  Clois Dupes, time 1660 Date 09/20/2020             Cosigned by: Ranelle Oyster, MD at 09/20/2020 11:30 AM  Revision History                Note Details  Author Standley Brooking, RN File Time 09/20/2020 11:26 AM  Author Type Rehab Admission Coordinator Status Signed  Last Editor Standley Brooking, RN Service Physical Medicine and Rehabilitation

## 2020-09-21 ENCOUNTER — Inpatient Hospital Stay (HOSPITAL_COMMUNITY): Payer: Self-pay | Admitting: Occupational Therapy

## 2020-09-21 ENCOUNTER — Inpatient Hospital Stay (HOSPITAL_COMMUNITY): Payer: Self-pay | Admitting: Speech Pathology

## 2020-09-21 ENCOUNTER — Inpatient Hospital Stay (HOSPITAL_COMMUNITY): Payer: Self-pay | Admitting: Physical Therapy

## 2020-09-21 LAB — COMPREHENSIVE METABOLIC PANEL
ALT: 69 U/L — ABNORMAL HIGH (ref 0–44)
AST: 44 U/L — ABNORMAL HIGH (ref 15–41)
Albumin: 3.1 g/dL — ABNORMAL LOW (ref 3.5–5.0)
Alkaline Phosphatase: 70 U/L (ref 38–126)
Anion gap: 11 (ref 5–15)
BUN: 38 mg/dL — ABNORMAL HIGH (ref 6–20)
CO2: 20 mmol/L — ABNORMAL LOW (ref 22–32)
Calcium: 8.8 mg/dL — ABNORMAL LOW (ref 8.9–10.3)
Chloride: 116 mmol/L — ABNORMAL HIGH (ref 98–111)
Creatinine, Ser: 1.1 mg/dL (ref 0.61–1.24)
GFR, Estimated: >60 mL/min (ref >60)
Glucose, Bld: 137 mg/dL — ABNORMAL HIGH (ref 70–99)
Potassium: 4 mmol/L (ref 3.5–5.1)
Sodium: 147 mmol/L — ABNORMAL HIGH (ref 135–145)
Total Bilirubin: 0.6 mg/dL (ref 0.3–1.2)
Total Protein: 6.7 g/dL (ref 6.5–8.1)

## 2020-09-21 LAB — CBC WITH DIFFERENTIAL/PLATELET
Abs Immature Granulocytes: 0.06 10*3/uL (ref 0.00–0.07)
Basophils Absolute: 0 10*3/uL (ref 0.0–0.1)
Basophils Relative: 0 %
Eosinophils Absolute: 0.3 10*3/uL (ref 0.0–0.5)
Eosinophils Relative: 3 %
HCT: 41.5 % (ref 39.0–52.0)
Hemoglobin: 13.2 g/dL (ref 13.0–17.0)
Immature Granulocytes: 1 %
Lymphocytes Relative: 17 %
Lymphs Abs: 1.6 10*3/uL (ref 0.7–4.0)
MCH: 28 pg (ref 26.0–34.0)
MCHC: 31.8 g/dL (ref 30.0–36.0)
MCV: 88.1 fL (ref 80.0–100.0)
Monocytes Absolute: 0.9 10*3/uL (ref 0.1–1.0)
Monocytes Relative: 10 %
Neutro Abs: 6.4 10*3/uL (ref 1.7–7.7)
Neutrophils Relative %: 69 %
Platelets: 271 10*3/uL (ref 150–400)
RBC: 4.71 MIL/uL (ref 4.22–5.81)
RDW: 14.8 % (ref 11.5–15.5)
WBC: 9.3 10*3/uL (ref 4.0–10.5)
nRBC: 0 % (ref 0.0–0.2)

## 2020-09-21 LAB — GLUCOSE, CAPILLARY: Glucose-Capillary: 125 mg/dL — ABNORMAL HIGH (ref 70–99)

## 2020-09-21 MED ORDER — BLOOD PRESSURE CONTROL BOOK
Freq: Once | Status: AC
  Filled 2020-09-21: qty 1

## 2020-09-21 MED ORDER — OSMOLITE 1.5 CAL PO LIQD
1000.0000 mL | ORAL | Status: DC
  Administered 2020-09-21 – 2020-09-27 (×10): 1000 mL
  Filled 2020-09-21 (×4): qty 1000

## 2020-09-21 NOTE — Plan of Care (Signed)
°  Problem: RH Balance Goal: LTG Patient will maintain dynamic sitting balance (PT) Description: LTG:  Patient will maintain dynamic sitting balance with assistance during mobility activities (PT) Flowsheets (Taken 09/21/2020 1522) LTG: Pt will maintain dynamic sitting balance during mobility activities with:: Supervision/Verbal cueing Goal: LTG Patient will maintain dynamic standing balance (PT) Description: LTG:  Patient will maintain dynamic standing balance with assistance during mobility activities (PT) Flowsheets (Taken 09/21/2020 1522) LTG: Pt will maintain dynamic standing balance during mobility activities with:: Moderate Assistance - Patient 50 - 74%   Problem: Sit to Stand Goal: LTG:  Patient will perform sit to stand with assistance level (PT) Description: LTG:  Patient will perform sit to stand with assistance level (PT) Flowsheets (Taken 09/21/2020 1522) LTG: PT will perform sit to stand in preparation for functional mobility with assistance level: Minimal Assistance - Patient > 75%   Problem: RH Bed Mobility Goal: LTG Patient will perform bed mobility with assist (PT) Description: LTG: Patient will perform bed mobility with assistance, with/without cues (PT). Flowsheets (Taken 09/21/2020 1522) LTG: Pt will perform bed mobility with assistance level of: Minimal Assistance - Patient > 75%   Problem: RH Car Transfers Goal: LTG Patient will perform car transfers with assist (PT) Description: LTG: Patient will perform car transfers with assistance (PT). Flowsheets (Taken 09/21/2020 1522) LTG: Pt will perform car transfers with assist:: Moderate Assistance - Patient 50 - 74%   Problem: RH Furniture Transfers Goal: LTG Patient will perform furniture transfers w/assist (OT/PT) Description: LTG: Patient will perform furniture transfers  with assistance (OT/PT). Flowsheets (Taken 09/21/2020 1522) LTG: Pt will perform furniture transfers with assist:: Moderate Assistance - Patient  50 - 74%   Problem: RH Ambulation Goal: LTG Patient will ambulate in controlled environment (PT) Description: LTG: Patient will ambulate in a controlled environment, # of feet with assistance (PT). Flowsheets (Taken 09/21/2020 1522) LTG: Pt will ambulate in controlled environ  assist needed:: Moderate Assistance - Patient 50 - 74% LTG: Ambulation distance in controlled environment: 33ft with LRAD to improve attention and use of RLE   Problem: RH Wheelchair Mobility Goal: LTG Patient will propel w/c in controlled environment (PT) Description: LTG: Patient will propel wheelchair in controlled environment, # of feet with assist (PT) Flowsheets (Taken 09/21/2020 1522) LTG: Pt will propel w/c in controlled environ  assist needed:: Supervision/Verbal cueing LTG: Propel w/c distance in controlled environment: 159ft Goal: LTG Patient will propel w/c in home environment (PT) Description: LTG: Patient will propel wheelchair in home environment, # of feet with assistance (PT). Flowsheets (Taken 09/21/2020 1522) LTG: Pt will propel w/c in home environ  assist needed:: Supervision/Verbal cueing Distance: wheelchair distance in controlled environment: 50   Problem: RH Awareness Goal: LTG: Patient will demonstrate awareness during functional activites type of (PT) Description: LTG: Patient will demonstrate awareness during functional activites type of (PT) Flowsheets (Taken 09/21/2020 1522) Patient will demonstrate awareness during functional activites type of: Emergent LTG: Patient will demonstrate awareness during functional activites type of (PT): Minimal Assistance - Patient > 75%

## 2020-09-21 NOTE — Progress Notes (Signed)
Patient ID: Jason Romero, male   DOB: [birth year], 49 y.o.   MRN: [id number] Team Conference Report to Patient/Family  Team Conference discussion was reviewed with the patient and caregiver, including goals, any changes in plan of care and target discharge date.  Patient and caregiver express understanding and are in agreement.  The patient has a target discharge date of  (reconference next week).  [name] 09/21/2020, 12:46 PM

## 2020-09-21 NOTE — Progress Notes (Addendum)
Patient ID: Jason Romero, male   DOB: 05-18-1971, 49 y.o.   MRN: [id number] Met with the patient to review role of the nurse CM and collaboration with the SW to facilitate preparation for discharge. Reviewed secondary stroke risks including HTN and multi medications along with smoking, A-fib without medications and ETOH, etc.. 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 Noted understanding of therapy and continued monitoring of progress with swallowing/speech. Handouts on eating after a stroke, smoking cessation and effects on health and HTN management booklet left for patient and spouse to review on HTN, smoking cessation and limited ETOH. Continue to follow along to discharge. [name]

## 2020-09-21 NOTE — Evaluation (Signed)
Occupational Therapy Assessment and Plan  Patient Details  Name: Jason Romero MRN: [id number] Date of Birth: 05/20/1971  OT Diagnosis: abnormal posture, cognitive deficits, flaccid hemiplegia and hemiparesis and muscle weakness (generalized) Rehab Potential: Rehab Potential (ACUTE ONLY): Fair ELOS: 4 weeks   Today's Date: 09/21/2020 OT Individual Time: 5361-4431 OT Individual Time Calculation (min): 68 min     Hospital Problem: Principal Problem:   ICH (intracerebral hemorrhage) ([place])   Past Medical History:  Past Medical History:  Diagnosis Date  . A-fib ([facility])   . Chronic headaches   . COPD (chronic obstructive pulmonary disease) ([place])   . GERD (gastroesophageal reflux disease)   . History of blood transfusion   . Hypertension   . MI (myocardial infarction) ([place])   . Seizures ([place])    Past Surgical History:  Past Surgical History:  Procedure Laterality Date  . CHOLECYSTECTOMY      Assessment & Plan Clinical Impression: Jason Romero.Lakinsis a 49 year old right-handed male with history of COPD/tobacco abuse with hypertension, history of seizures on no seizure medication, atrial fibrillation not on anticoagulation did not follow-up cardiology services. Per chart review lives with spouse independent prior to admission. 1 level home with level entry. Presented 09/11/2020 with right side weakness facial droop slumped over in his chair. Blood pressure 195/127. 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 No shift or hydrocephalus. CTA of head and neck no hemodynamically significant stenosis. Echocardiogram with ejection fraction of 50 to 54% grade 2 diastolic dysfunction. No regional wall motion abnormalities. Admission chemistries unremarkable except glucose 114 potassium 2.8 urine drug screen positive amphetamines and marijuana, urinalysis negative nitrite. Neurology follow-up initially placed on 3% saline. ICH felt to be  related to hypertensive crisis. Placed on Cleviprex for blood pressure control. Patient is currently n.p.o. with alternative means of nutritional support. Therapy evaluations completed and patient was admitted for a comprehensive rehab program. Patient transferred to CIR on 09/20/2020 .    Patient currently requires max with basic self-care skills secondary to muscle weakness and muscle paralysis, decreased cardiorespiratoy endurance, impaired timing and sequencing, abnormal tone, unbalanced muscle activation, decreased coordination and decreased motor planning, decreased midline orientation and decreased attention to right and decreased attention, decreased awareness, decreased problem solving, decreased safety awareness, decreased memory and delayed processing.  Prior to hospitalization, patient could complete ADLs/IADLs with independence.  Patient will benefit from skilled intervention to decrease level of assist with basic self-care skills and increase independence with basic self-care skills prior to discharge home with care partner.  Anticipate patient will require 24 hour supervision and minimal physical assistance and follow up home health.  OT - End of Session Activity Tolerance: Improving Endurance Deficit: Yes Endurance Deficit Description: Rest breaks 2/2 fatigue OT Assessment Rehab Potential (ACUTE ONLY): Fair OT Barriers to Discharge: Inaccessible home environment;Decreased caregiver support;Incontinence;Insurance for SNF coverage OT Patient demonstrates impairments in the following area(s): Balance;Behavior;Cognition;Endurance;Motor;Perception;Safety;Sensory;Vision OT Basic ADL's Functional Problem(s): Eating;Grooming;Bathing;Dressing;Toileting OT Transfers Functional Problem(s): Toilet OT Additional Impairment(s): Fuctional Use of Upper Extremity OT Plan OT Intensity: Minimum of 1-2 x/day, 45 to 90 minutes OT Frequency: 5 out of 7 days OT Duration/Estimated Length of Stay: 4  weeks OT Treatment/Interventions: Balance/vestibular training;Cognitive remediation/compensation;Community reintegration;Discharge planning;DME/adaptive equipment instruction;Functional electrical stimulation;Functional mobility training;Neuromuscular re-education;Pain management;Patient/family education;Psychosocial support;Self Care/advanced ADL retraining;Therapeutic Activities;Therapeutic Exercise;UE/LE Strength taining/ROM;UE/LE Coordination activities;Visual/perceptual remediation/compensation;Wheelchair propulsion/positioning OT Self Feeding Anticipated Outcome(s): Mod I OT Basic Self-Care Anticipated Outcome(s): Min A OT Toileting Anticipated Outcome(s): Min A OT Bathroom Transfers Anticipated Outcome(s):  Mod A OT Recommendation Recommendations for Other Services: Other (comment) (TBD) Patient destination: Home Follow Up Recommendations: Home health OT;24 hour supervision/assistance Equipment Recommended: To be determined   OT Evaluation Precautions/Restrictions  Precautions Precautions: Fall Precaution Comments: R hemiplegia, R inattention Restrictions Weight Bearing Restrictions: No General Chart Reviewed: Yes Response to Previous Treatment: Patient with no complaints from previous session Family/Caregiver Present: Yes (Pt mother) Vital Signs Therapy Vitals Temp: 97.7 F (36.5 C) Temp Source: Oral Pulse Rate: 73 Resp: 16 BP: 133/87 Patient Position (if appropriate): Lying Oxygen Therapy SpO2: 100 % O2 Device: Room Air Pain Pain Assessment Pain Scale: 0-10 Faces Pain Scale: No hurt Home Living/Prior Functioning Home Living Living Arrangements: Spouse/significant other, Parent (Mom in-home) Available Help at Discharge: Family, Available 24 hours/day Type of Home: House Home Access: Stairs to enter Technical brewer of Steps: 4 Home Layout: One level Bathroom Shower/Tub: Optometrist: Yes  Lives With:  Spouse Prior Function Level of Independence: Independent with basic ADLs  Able to Take Stairs?: Yes Driving: Yes Vocation: Other (Comment) Comments: mother present to confirm. states he has steps at home but may be able to get ramp. he doesn't drive or work; does some basic ADL around the home Vision Baseline Vision/History: No visual deficits Patient Visual Report: Other (comment) Vision Assessment?: Vision impaired- to be further tested in functional context Additional Comments: mild R inattention with, but able to scan to the R when cued by PT Perception  Perception: Impaired Inattention/Neglect: Does not attend to right visual field;Does not attend to right side of body;Impaired-to be further tested in functional context Praxis Praxis: Impaired Praxis Impairment Details: Motor planning;Perseveration Cognition Overall Cognitive Status: Impaired/Different from baseline Arousal/Alertness: Awake/alert Orientation Level: Person;Place;Situation Person: Disoriented (Disoriented to D.O.B with multi choice cues) Place: Oriented Situation: Disoriented Year: Other (Comment) (Incorrect with multichoice cues) Month: January (Incorrect with multichoice cues) Day of Week: Incorrect (Inocrrect w/ multi choice cues) Memory: Impaired (UTA due to language) Memory Impairment: Other (comment) (Unable to assess 2/2 aphasia) Immediate Memory Recall: Sock;Blue;Bed Memory Recall Sock: Not able to recall Memory Recall Blue: Not able to recall Memory Recall Bed: Not able to recall Attention: Sustained Sustained Attention: Impaired Sustained Attention Impairment: Verbal basic;Functional basic Awareness: Impaired Awareness Impairment: Intellectual impairment Problem Solving: Impaired Problem Solving Impairment: Verbal basic;Functional basic Executive Function:  (all impaired due to lower level deficits) Behaviors: Restless Safety/Judgment: Impaired Sensation Sensation Light Touch: Impaired  Detail Light Touch Impaired Details: Absent RLE;Absent RUE Hot/Cold: Impaired Detail Hot/Cold Impaired Details: Absent RUE;Absent RLE Proprioception: Impaired Detail Proprioception Impaired Details: Absent RLE;Absent RUE Stereognosis: Impaired Detail Stereognosis Impaired Details: Absent RUE;Absent RLE Coordination Gross Motor Movements are Fluid and Coordinated: No Fine Motor Movements are Fluid and Coordinated: No Coordination and Movement Description: Flaccid hemiparesis Finger Nose Finger Test: Unable to assess 2/2 RUE deficits Heel Shin Test: unable to perfom on the RLE Motor  Motor Motor: Abnormal tone;Hemiplegia Motor - Skilled Clinical Observations: dense R sided hemiplegia. R hypotonia  Trunk/Postural Assessment  Cervical Assessment Cervical Assessment: Exceptions to [facility] (mild L gaze preference) Thoracic Assessment Thoracic Assessment: Exceptions to [facility] (lateral deviation R, rounded shoulders) Lumbar Assessment Lumbar Assessment: Exceptions to [facility] (R lateral deviation) Postural Control Postural Control: Deficits on evaluation Trunk Control: limited on the R Righting Reactions: absent on the R Protective Responses: delayed/absent on the R Postural Limitations: R lateeral lean  Balance Balance Balance Assessed: Yes Static Sitting Balance Static Sitting - Balance Support: Left upper extremity supported Static Sitting -  Level of Assistance: 3: Mod assist;4: Min assist Dynamic Sitting Balance Dynamic Sitting - Balance Support: Left upper extremity supported Dynamic Sitting - Level of Assistance: 1: +1 Total assist;2: Max assist Sitting balance - Comments: pt with total LOB to the with all dynamic movement to the R. Static Standing Balance Static Standing - Level of Assistance: 2: Max assist Static Standing - Comment/# of Minutes: LUE support on rail in stedy or parallel bar Dynamic Standing Balance Dynamic Standing - Level of Assistance: Not tested (comment) Dynamic  Standing - Comments: unable to perfom due to fatigue Extremity/Trunk Assessment RUE Assessment RUE Assessment: Exceptions to [facility]    Care Tool Care Tool Self Care Eating   Eating Assist Level: Dependent - Patient 0%    Oral Care    Oral Care Assist Level: Minimal Assistance - Patient > 75% (Suction toothbrush)    Bathing   Body parts bathed by patient: Right arm;Chest;Abdomen;Front perineal area;Left upper leg;Face (UB EOB, LB bed level) Body parts bathed by helper: Left arm;Buttocks;Right upper leg;Right lower leg;Left lower leg   Assist Level: Moderate Assistance - Patient 50 - 74%    Upper Body Dressing(including orthotics)   What is the patient wearing?: Pull over shirt   Assist Level: Moderate Assistance - Patient 50 - 74%    Lower Body Dressing (excluding footwear)   What is the patient wearing?: Pants;Incontinence brief Assist for lower body dressing: Moderate Assistance - Patient 50 - 74%    Putting on/Taking off footwear   What is the patient wearing?: Socks;Shoes Assist for footwear: Total Assistance - Patient < 25%       Care Tool Toileting Toileting activity   Assist for toileting: 2 Helpers     Care Tool Bed Mobility Roll left and right activity   Roll left and right assist level: Maximal Assistance - Patient 25 - 49%    Sit to lying activity   Sit to lying assist level: Maximal Assistance - Patient 25 - 49%    Lying to sitting edge of bed activity   Lying to sitting edge of bed assist level: Maximal Assistance - Patient 25 - 49%     Care Tool Transfers Sit to stand transfer   Sit to stand assist level: 2 Helpers    Chair/bed transfer   Chair/bed transfer assist level: 2 Pension scheme manager transfer   Assist Level: 2 Helpers     Care Tool Cognition Expression of Ideas and Wants Expression of Ideas and Wants: Frequent difficulty - frequently exhibits difficulty with expressing needs and ideas   Understanding Verbal and Non-Verbal Content  Understanding Verbal and Non-Verbal Content: Usually understands - understands most conversations, but misses some part/intent of message. Requires cues at times to understand   Memory/Recall Ability *first 3 days only Memory/Recall Ability *first 3 days only: That he or she is in a hospital/hospital unit;Current season    Refer to Care Plan for Long Term Goals  SHORT TERM GOAL WEEK 1 OT Short Term Goal 1 (Week 1): Patient will maintain static sitting balance at EOB with Min A in prep for ADLs. OT Short Term Goal 2 (Week 1): Patient will complete 2/3 parts of UB dressing task with use of hemi techinque. OT Short Term Goal 3 (Week 1): Patient complete 1/3 parts of LB dressing task seated EOB. OT Short Term Goal 4 (Week 1): Patient will complete 2/3 grooming tasks seated at sink level.  Recommendations for other services: Therapeutic Recreation  Stress management  Skilled Therapeutic Intervention  Patient met lying supine in bed with mother present at bedside. No facial grimacing indicating pain at rest or with activity. Mother present at bedside. Bathing/dressing in supine and at EOB with patient able to wash LLE with cues for pacing. Assist to bathe RUE at upper and lower leg. Patient required assist to don pants over RLE but able to thread LLE at bed level. Rolling R with Min A and L with Max A and cues for technique. Education on safety with affected RUE to maintain joint integrity. Patient with noted L gaze preference but able to look to R with cueing. Profound hemiplegia with sensory deficits also noted during functional tasks. With transfer from supine to EOB, patient impulsive requiring 1-step verbal cues for safety. Patient able to bathe UB at EOB with occasional assist to maintain sitting balance. Forward and R LOB noted throughout task. Return to supine with assist to guide RUE from EOB to bed level. With set-up of BUE, patient able to assist with anterior scoot toward [facility] with bed in  neutral/flat position. HOB elevated and patient provided with suction toothbrush for oral hygiene with Min A for thoroughness. Session concluded with patient lying supine in bed with call bell within reach and bed alarm activated. Mother present at bedside upon OT exit.   ADL ADL Eating: NPO ([employer]) Grooming: Minimal assistance Where Assessed-Grooming: Bed level Upper Body Bathing: Moderate assistance;Maximal cueing Where Assessed-Upper Body Bathing: Edge of bed Lower Body Bathing: Moderate assistance Where Assessed-Lower Body Bathing: Bed level Upper Body Dressing: Moderate assistance Where Assessed-Upper Body Dressing: Edge of bed Lower Body Dressing: Maximal assistance;Maximal cueing Where Assessed-Lower Body Dressing: Bed level Toileting: Dependent (Incontinent x2) Where Assessed-Toileting: Bed level Toilet Transfer: Not assessed Tub/Shower Transfer: Not assessed Mobility  Bed Mobility Bed Mobility: Rolling Right;Rolling Left;Supine to Sit;Sit to Supine Rolling Right: Moderate Assistance - Patient 50-74% Rolling Left: Maximal Assistance - Patient 25-49% Supine to Sit: Maximal Assistance - Patient - Patient 25-49% Sit to Supine: Maximal Assistance - Patient 25-49% Transfers Sit to Stand: Total Assistance - Patient < 25%   Discharge Criteria: Patient will be discharged from OT if patient refuses treatment 3 consecutive times without medical reason, if treatment goals not met, if there is a change in medical status, if patient makes no progress towards goals or if patient is discharged from hospital.  The above assessment, treatment plan, treatment alternatives and goals were discussed and mutually agreed upon: by patient  Maliha Outten R Howerton-Davis 09/21/2020, 3:33 PM

## 2020-09-21 NOTE — Progress Notes (Signed)
Initial Nutrition Assessment  RD working remotely.  DOCUMENTATION CODES:   Not applicable  INTERVENTION:   Continue tube feeds via Cortrak: - Increase Osmolite 1.5 to 75 ml/hr (tube feeds can be held for up to 4 hours for therapies) - Continue ProSource TF 45 ml TID - Continue free water flushes of 200 ml q 4 hours  Tube feeding regimen over 20 hours provides 2370 kcal, 128 grams of protein, and 1143 ml of H2O (meets 100% of estimated needs).  Total free water with flushes: 2343 ml  NUTRITION DIAGNOSIS:   Inadequate oral intake related to dysphagia as evidenced by NPO status.  GOAL:   Patient will meet greater than or equal to 90% of their needs  MONITOR:   Diet advancement, Labs, Weight trends, TF tolerance, I & O's  REASON FOR ASSESSMENT:   Consult Enteral/tube feeding initiation and management  ASSESSMENT:   49 year old male with PMH of COPD, tobacco abuse, HTN, seizures, atrial fibrillation. Presented 09/11/20 with right-sided weakness and facial droop. Cranial CT scan showed intraparenchymal hematoma in the left basal ganglia. ICH felt to be related to hypertensive crisis. Pt was placed on Cleviprex for blood pressure control. Pt is currently NPO with Cortrak in place for tube feeds. Admitted to CIR on 11/09.   RD unable to reach pt via phone call. Will attempt to obtain diet and weight history upon follow-up. Per review of RD notes from acute admission, pt had a good appetite and ate 2 meals daily with snacks PTA. No reports of weight loss.  Reviewed weight history in chart.Last weights available prior to acute admission are from 2018. No evidence of weight loss.  RD will adjust TF regimen so that it can be off for 4 hours for therapies.  Current TF: Osmolite 1.5 @ 60 ml/hr, ProSource TF 45 ml TID, free water 200 ml q 4 hours  Medications reviewed and include: protonix, senokot  Labs reviewed: sodium 147, BUN 38, elevated LFTs CBG's: 110-131 x 24  hours  NUTRITION - FOCUSED PHYSICAL EXAM:  Unable to complete at this time. RD working remotely.  Diet Order:   Diet Order            Diet NPO time specified  Diet effective now                 EDUCATION NEEDS:   No education needs have been identified at this time  Skin:  Skin Assessment: Reviewed RN Assessment  Last BM:  09/21/20 medium type 5  Height:   Ht Readings from Last 1 Encounters:  09/20/20 6' 1.5" (1.867 m)    Weight:   Wt Readings from Last 1 Encounters:  09/21/20 106 kg    BMI:  Body mass index is 30.41 kg/m.  Estimated Nutritional Needs:   Kcal:  2300-2500  Protein:  115-135 grams  Fluid:  >/= 2.0 L    Earma Reading, MS, RD, LDN Inpatient Clinical Dietitian Please see [facility] for contact information.

## 2020-09-21 NOTE — Evaluation (Signed)
Physical Therapy Assessment and Plan  Patient Details  Name: Jason Romero MRN: [id number] Date of Birth: 03/20/1971  PT Diagnosis: Abnormal posture, Abnormality of gait, Cognitive deficits, Coordination disorder, Difficulty walking, Hemiplegia non-dominant, Hypotonia, Impaired cognition, Impaired sensation, Muscle spasms and Muscle weakness Rehab Potential: Fair ELOS: 4 weeks   Today's Date: 09/21/2020 PT Individual Time: 1030-1130 PT Individual Time Calculation (min): 60 min    Hospital Problem: Principal Problem:   ICH (intracerebral hemorrhage) ([place])   Past Medical History:  Past Medical History:  Diagnosis Date  . A-fib ([place])   . Chronic headaches   . COPD (chronic obstructive pulmonary disease) ([place])   . GERD (gastroesophageal reflux disease)   . History of blood transfusion   . Hypertension   . MI (myocardial infarction) ([place])   . Seizures ([place])    Past Surgical History:  Past Surgical History:  Procedure Laterality Date  . CHOLECYSTECTOMY      Assessment & Plan Clinical Impression: Patient is a 49 year old right-handed male with history of COPD/tobacco abuse with hypertension, history of seizures on no seizure medication, atrial fibrillation not on anticoagulation did not follow-up cardiology services. Per chart review lives with spouse independent prior to admission. 1 level home with level entry. Presented 09/11/2020 with right side weakness facial droop slumped over in his chair. Blood pressure 195/127. 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 No shift or hydrocephalus. CTA of head and neck no hemodynamically significant stenosis. Echocardiogram with ejection fraction of 50 to 75% grade 2 diastolic dysfunction. No regional wall motion abnormalities. Admission chemistries unremarkable except glucose 114 potassium 2.8 urine drug screen positive amphetamines and marijuana, urinalysis negative nitrite.  Neurology follow-up initially placed on 3% saline. ICH felt to be related to hypertensive crisis. Placed on Cleviprex for blood pressure control. Patient is currently n.p.o. with alternative means of nutritional support.  Patient transferred to CIR on 09/20/2020 .   Patient currently requires total with mobility secondary to muscle weakness, muscle joint tightness and muscle paralysis, decreased cardiorespiratoy endurance, impaired timing and sequencing, abnormal tone, unbalanced muscle activation, motor apraxia, decreased coordination and decreased motor planning, decreased visual acuity and hemianopsia, decreased attention to right, decreased initiation, decreased attention, decreased awareness, decreased problem solving, decreased safety awareness, decreased memory and delayed processing and decreased sitting balance, decreased standing balance, decreased postural control, hemiplegia and decreased balance strategies.  Prior to hospitalization, patient was independent  with mobility and lived with Spouse in a House home.  Home access is 4Stairs to enter.  Patient will benefit from skilled PT intervention to maximize safe functional mobility, minimize fall risk and decrease caregiver burden for planned discharge home with 24 hour assist.  Anticipate patient will benefit from follow up [facility] at discharge.  PT - End of Session Activity Tolerance: Tolerates < 10 min activity with changes in vital signs Endurance Deficit: Yes Endurance Deficit Description: Rest breaks 2/2 fatigue PT Assessment Rehab Potential (ACUTE/IP ONLY): Fair PT Barriers to Discharge: Inaccessible home environment;Home environment access/layout;Incontinence;Medication compliance;Weight;Behavior PT Patient demonstrates impairments in the following area(s): Balance;Perception;Behavior;Safety;Edema;Sensory;Endurance;Skin Integrity;Motor;Nutrition;Pain PT Transfers Functional Problem(s): Bed Mobility;Bed to Chair;Car;Furniture;Floor PT  Locomotion Functional Problem(s): Ambulation;Stairs;Wheelchair Mobility PT Plan PT Intensity: Minimum of 1-2 x/day ,45 to 90 minutes PT Frequency: 5 out of 7 days PT Duration Estimated Length of Stay: 4 weeks PT Treatment/Interventions: Training and development officer;Ambulation/gait training;Cognitive remediation/compensation;Community reintegration;DME/adaptive equipment instruction;Disease management/prevention;Functional electrical stimulation;Discharge planning;Functional mobility training;Neuromuscular re-education;Psychosocial support;Patient/family education;Pain management;Skin care/wound management;Splinting/orthotics;Stair training;Therapeutic Exercise;Therapeutic Activities;UE/LE Strength taining/ROM;UE/LE Coordination activities;Visual/perceptual  remediation/compensation;Wheelchair propulsion/positioning PT Recommendation Follow Up Recommendations: Home health PT Patient destination: Home Equipment Recommended: Wheelchair (measurements);Wheelchair cushion (measurements);To be determined   PT Evaluation Precautions/Restrictions Precautions Precautions: Fall Precaution Comments: R hemiplegia, R inattention Restrictions Weight Bearing Restrictions: No fall R inattention  General   Vital SignsTherapy Vitals Temp: 97.7 F (36.5 C) Temp Source: Oral Pulse Rate: 73 Resp: 16 BP: 133/87 Patient Position (if appropriate): Lying Oxygen Therapy SpO2: 100 % O2 Device: Room Air Pain Pain Assessment Pain Scale: 0-10 Faces Pain Scale: No hurt denies  Home Living/Prior Functioning Home Living Available Help at Discharge: Family;Available 24 hours/day Type of Home: House Home Access: Stairs to enter [employer] of Steps: 4 Home Layout: One level Bathroom Shower/Tub: Optometrist: Yes  Lives With: Spouse Prior Function Level of Independence: Independent with basic ADLs  Able to Take Stairs?: Yes Driving:  Yes Vocation: Other (Comment) Comments: mother present to confirm. states he has steps at home but may be able to get ramp. he doesn't drive or work; does some basic ADL around the home Vision/Perception  Vision - Assessment Additional Comments: mild R inattention with, but able to scan to the R when cued by PT Perception Perception: Impaired Inattention/Neglect: Does not attend to right visual field;Does not attend to right side of body;Impaired-to be further tested in functional context Praxis Praxis: Impaired Praxis Impairment Details: Motor planning;Perseveration  Cognition Overall Cognitive Status: Impaired/Different from baseline Arousal/Alertness: Awake/alert Orientation Level: Oriented to person;Oriented to place Attention: Sustained Sustained Attention: Impaired Sustained Attention Impairment: Verbal basic;Functional basic Memory: Impaired (UTA due to language) Memory Impairment: Other (comment) (Unable to assess 2/2 aphasia) Immediate Memory Recall: Sock;Blue;Bed Memory Recall Sock: Not able to recall Memory Recall Blue: Not able to recall Memory Recall Bed: Not able to recall Awareness: Impaired Awareness Impairment: Intellectual impairment Problem Solving: Impaired Problem Solving Impairment: Verbal basic;Functional basic Executive Function:  (all impaired due to lower level deficits) Behaviors: Restless Safety/Judgment: Impaired Sensation Sensation Light Touch: Impaired Detail Light Touch Impaired Details: Absent RLE;Absent RUE Hot/Cold: Impaired Detail Hot/Cold Impaired Details: Absent RUE;Absent RLE Proprioception: Impaired Detail Proprioception Impaired Details: Absent RLE;Absent RUE Stereognosis: Impaired Detail Stereognosis Impaired Details: Absent RUE;Absent RLE Coordination Gross Motor Movements are Fluid and Coordinated: No Fine Motor Movements are Fluid and Coordinated: No Coordination and Movement Description: Flaccid hemiparesis Finger Nose Finger  Test: Unable to assess 2/2 RUE deficits Heel Shin Test: unable to perfom on the RLE Motor  Motor Motor: Abnormal tone;Hemiplegia Motor - Skilled Clinical Observations: dense R sided hemiplegia. R hypotonia   Trunk/Postural Assessment  Cervical Assessment Cervical Assessment: Exceptions to [facility] (mild L gaze preference) Thoracic Assessment Thoracic Assessment: Exceptions to [facility] (lateral deviation R, rounded shoulders) Lumbar Assessment Lumbar Assessment: Exceptions to [facility] (R lateral deviation) Postural Control Postural Control: Deficits on evaluation Trunk Control: limited on the R Righting Reactions: absent on the R Protective Responses: delayed/absent on the R Postural Limitations: R lateeral lean  Balance Balance Balance Assessed: Yes Static Sitting Balance Static Sitting - Balance Support: Left upper extremity supported Static Sitting - Level of Assistance: 3: Mod assist;4: Min assist Dynamic Sitting Balance Dynamic Sitting - Balance Support: Left upper extremity supported Dynamic Sitting - Level of Assistance: 1: +1 Total assist;2: Max assist Sitting balance - Comments: pt with total LOB to the with all dynamic movement to the R. Static Standing Balance Static Standing - Level of Assistance: 2: Max assist Static Standing - Comment/# of Minutes: LUE support on rail in stedy  or parallel bar Dynamic Standing Balance Dynamic Standing - Level of Assistance: Not tested (comment) Dynamic Standing - Comments: unable to perfom due to fatigue Extremity Assessment  RUE Assessment RUE Assessment: Exceptions to [facility]   RLE Assessment RLE Assessment: Exceptions to [facility] General Strength Comments: 0/5 proximal to distal LLE Assessment LLE Assessment: Within Functional Limits General Strength Comments: grossly 4+/5 to 5/5 proxima to distal with MMT  Care Tool Care Tool Bed Mobility Roll left and right activity   Roll left and right assist level: Maximal Assistance - Patient 25 - 49%     Sit to lying activity   Sit to lying assist level: Total Assistance - Patient < 25%    Lying to sitting edge of bed activity   Lying to sitting edge of bed assist level: Total Assistance - Patient < 25%     Care Tool Transfers Sit to stand transfer   Sit to stand assist level: 2 Helpers    Chair/bed transfer   Chair/bed transfer assist level: Total Assistance - Patient < 25%     Toilet transfer   Assist Level: 2 Psychologist, prison and probation services transfer assist level: Total Assistance - Patient < 25%      Care Tool Locomotion Ambulation Ambulation activity did not occur: Safety/medical concerns        Walk 10 feet activity Walk 10 feet activity did not occur: Safety/medical concerns       Walk 50 feet with 2 turns activity Walk 50 feet with 2 turns activity did not occur: Safety/medical concerns      Walk 150 feet activity Walk 150 feet activity did not occur: Safety/medical concerns      Walk 10 feet on uneven surfaces activity Walk 10 feet on uneven surfaces activity did not occur: Safety/medical concerns      Stairs Stair activity did not occur: Safety/medical concerns        Walk up/down 1 step activity Walk up/down 1 step or curb (drop down) activity did not occur: Safety/medical concerns     Walk up/down 4 steps activity did not occuR: Safety/medical concerns  Walk up/down 4 steps activity      Walk up/down 12 steps activity Walk up/down 12 steps activity did not occur: Safety/medical concerns      Pick up small objects from floor Pick up small object from the floor (from standing position) activity did not occur: Safety/medical concerns      Wheelchair       Wheelchair assist level: Dependent - Patient 0% Max wheelchair distance: 150  Wheel 50 feet with 2 turns activity   Assist Level: Dependent - Patient 0%  Wheel 150 feet activity   Assist Level: Dependent - Patient 0%    Refer to Care Plan for Long Term Goals  SHORT TERM GOAL WEEK 1 PT  Short Term Goal 1 (Week 1): Pt will perfom bed mobility with mod assist and use of bed features PT Short Term Goal 2 (Week 1): Pt will propell WC 120f with min assist PT Short Term Goal 3 (Week 1): Pt will tolerate sitting up in WC 2 hours between therapies PT Short Term Goal 4 (Week 1): Pt will perform sitting balance EOB with mod assist from PT up to 5 minutes  Recommendations for other services: None   Skilled Therapeutic Intervention Mobility Bed Mobility Bed Mobility: Rolling Right;Rolling Left;Supine to Sit;Sit to Supine Rolling Right: Moderate Assistance - Patient 50-74% Rolling Left: Maximal Assistance - Patient  25-49% Supine to Sit: Maximal Assistance - Patient - Patient 25-49% Sit to Supine: Maximal Assistance - Patient 25-49% Transfers Transfers: Sit to Stand;Stand Pivot Transfers;Squat Pivot Transfers Sit to Stand: Total Assistance - Patient < 25% Stand Pivot Transfers: Total Assistance - Patient < 25% Squat Pivot Transfers: Total Assistance - Patient < 25%;Maximal Assistance - Patient 25-49% Transfer (Assistive device): None Transfer via Lift Equipment: Probation officer Ambulation: No Gait Gait: No Stairs / Additional Locomotion Stairs: No Architect: Yes Wheelchair Assistance: Dependent - Patient 0% Distance: 150    PT treatment:  Pt received supine in bed and agreeable to PT. Supine>sit transfer with max assist and cues for R side management and to prevent LOB to the R. PT instructed patient in PT Evaluation and initiated treatment intervention; see above for results. PT educated patient in [place], rehab potential, rehab goals, and discharge recommendations.  Pt noted to have incontinence of bowel and bladder. Squat pivot transfer to [facility] with max-total A to prevent LOB to the R. Sit<>stand in steady to perform pericare in standing. Mod-max assist to prevent LOB in standing with LUE supported. Pt transported to rehab gym in [facility].  Sit<>stand in parallel bars with R knee blocked and max assist.   Pt returned to room and performed steady transfer to bed with max assist. Sit>supine completed with max assist for safety.  and left supine in bed with call bell in reach and all needs met.    Discharge Criteria: Patient will be discharged from PT if patient refuses treatment 3 consecutive times without medical reason, if treatment goals not met, if there is a change in medical status, if patient makes no progress towards goals or if patient is discharged from hospital.  The above assessment, treatment plan, treatment alternatives and goals were discussed and mutually agreed upon: by patient  [name] 09/21/2020, 4:28 PM

## 2020-09-21 NOTE — Progress Notes (Signed)
[facility] PHYSICAL MEDICINE & REHABILITATION PROGRESS NOTE   Subjective/Complaints:  Aphasic, occ single word output Mother at bedside  ROS- unable to obtain due to aphasia   Objective:   No results found. Recent Labs    09/19/20 0142 09/21/20 0410  WBC 8.3 9.3  HGB 13.7 13.2  HCT 43.4 41.5  PLT 269 271   Recent Labs    09/20/20 0330 09/21/20 0410  NA 148* 147*  K 3.8 4.0  CL 116* 116*  CO2 21* 20*  GLUCOSE 150* 137*  BUN 42* 38*  CREATININE 1.07 1.10  CALCIUM 8.9 8.8*   No intake or output data in the 24 hours ending 09/21/20 2563      Physical Exam: Vital Signs Blood pressure (!) 120/99, pulse 83, temperature 97.6 F (36.4 C), resp. rate 16, height 6' 1.5" (1.867 m), weight 106 kg, SpO2 99 %.   General: No acute distress Mood and affect are appropriate Heart: Regular rate and rhythm no rubs murmurs or extra sounds Lungs: Clear to auscultation, breathing unlabored, no rales or wheezes Abdomen: Positive bowel sounds, soft nontender to palpation, nondistended Extremities: No clubbing, cyanosis, or edema Skin: No evidence of breakdown, no evidence of rash Neurologic: Cranial nerves II through XII intact, motor strength is 5/5 inleft , 0/5 RIght  deltoid, bicep, tricep, grip, hip flexor, knee extensors, ankle dorsiflexor and plantar flexor Sensory exam absent pinch RUE and RLE  Musculoskeletal: no pain with range of motion in all 4 extremities. No joint swelling   Assessment/Plan: 1. Functional deficits which require 3+ hours per day of interdisciplinary therapy in a comprehensive inpatient rehab setting. Physiatrist is providing close team supervision and 24 hour management of active medical problems listed below. Physiatrist and rehab team continue to assess barriers to discharge/monitor patient progress toward functional and medical goals  Care Tool:  Bathing              Bathing assist       Upper Body Dressing/Undressing Upper body  dressing        Upper body assist      Lower Body Dressing/Undressing Lower body dressing            Lower body assist       Toileting Toileting    Toileting assist       Transfers Chair/bed transfer  Transfers assist           Locomotion Ambulation   Ambulation assist              Walk 10 feet activity   Assist           Walk 50 feet activity   Assist           Walk 150 feet activity   Assist           Walk 10 feet on uneven surface  activity   Assist           Wheelchair     Assist               Wheelchair 50 feet with 2 turns activity    Assist            Wheelchair 150 feet activity     Assist          Blood pressure (!) 120/99, pulse 83, temperature 97.6 F (36.4 C), resp. rate 16, height 6' 1.5" (1.867 m), weight 106 kg, SpO2 99 %.    Medical Problem List and Plan:  1.  Right side hemiplegia, aphasia and dysphagia secondary to left basal ganglia ICH related to hypertensive crisis             -patient may shower             -ELOS/Goals: 3-4 weeks, min assist with PT, OT, mod assist with SLP 2.  Antithrombotics: -DVT/anticoagulation: SCDs             -antiplatelet therapy: N/A 3. Pain Management: Tylenol as needed 4. Mood: Provide emotional support             -antipsychotic agents: N/A 5. Neuropsych: This patient is not capable of making decisions on his own behalf. 6. Skin/Wound Care: Routine skin checks 7. Fluids/Electrolytes/Nutrition: Routine in and outs with follow-up chemistries 8.  Dysphagia.  NPO.  Alternative means of nutritional support.  Speech therapy follow-up, may need PEG if slow improvement  9.  Hypertension.  Norvasc 10 mg daily, hydralazine 100 mg every 8 hours, Normodyne 300 mg every 8 hours, lisinopril 20 mg twice daily.  Monitor with increased mobility Vitals:   09/21/20 0305 09/21/20 0912  BP: (!) 120/99 (!) 130/95  Pulse: 83   Resp: 16   Temp: 97.6 F  (36.4 C)   SpO2: 99%   Fair /good control on current meds 10.  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  Provide counseling 11.  Obesity.  BMI 30.67.  Dietary follow-up    LOS: 1 days A FACE TO FACE EVALUATION WAS PERFORMED  Jason Romero 09/21/2020, 6:23 AM

## 2020-09-21 NOTE — Progress Notes (Signed)
   Patient Details  Name: Jason Romero MRN: [id number] Date of Birth: 1971/06/07  Today's Date: 09/21/2020  Hospital Problems: Principal Problem:   ICH (intracerebral hemorrhage) [facility])  Past Medical History:  Past Medical History:  Diagnosis Date  . A-fib ([facility])   . Chronic headaches   . COPD (chronic obstructive pulmonary disease) ([facility])   . GERD (gastroesophageal reflux disease)   . History of blood transfusion   . Hypertension   . MI (myocardial infarction) ([facility])   . Seizures ([facility])    Past Surgical History:  Past Surgical History:  Procedure Laterality Date  . CHOLECYSTECTOMY     Social History:  reports that he has been smoking. He has been smoking about 1.00 pack per day. He has never used smokeless tobacco. He reports current alcohol use. He reports current drug use. Drug: Marijuana.  Family / Support Systems Patient Roles: Spouse Spouse/Significant Other: [name] Other Supports: [name] (Mother), Lafonda Mosses (Sister) Anticipated Caregiver: Spouse and sister Ability/Limitations of Caregiver: Spouse has COPD, sister here from [employer], mother Caregiver Availability: 24/7  Social History Preferred language: English Religion: None Education: high school (10/11th grade) Read: Yes Write: Yes Employment Status: Unemployed Guardian/Conservator: none   Abuse/Neglect Abuse/Neglect Assessment Can Be Completed: Unable to assess, patient is non-responsive or altered mental status  Emotional Status Pt's affect, behavior and adjustment status: mom reports patient: not sleeping, depressed, substance abuse Recent Psychosocial Issues: no Psychiatric History: no Substance Abuse History: alcohol and substance  Patient / Family Perceptions, Expectations & Goals Pt/Family understanding of illness & functional limitations: yes Premorbid pt/family roles/activities: Patient previously independent Anticipated changes in roles/activities/participation: Will require assistance  post discharge Pt/family expectations/goals: Min A  [employer]: None Premorbid Home Care/DME Agencies: None Transportation available at discharge: family able to transport Resource referrals recommended: Neuropsychology (Will schedule patient once speech improves for: depression and substance abuse)  Discharge Planning Living Arrangements: Spouse/significant other, Parent (Mom in-home) Support Systems: Spouse/significant other, Parent, Other relatives Type of Residence: Private residence (1 Level home, level entry) Insurance Resources: Futures trader (Uninsured) Surveyor, quantity Screen Referred: Yes Living Expenses: Lives with family Money Management: Patient, Spouse Care Coordinator Barriers to Discharge: Nutrition means, Insurance for SNF coverage, Lack of/limited family support, Decreased caregiver support, Other (comments) Care Coordinator Barriers to Discharge Comments: [place], UNINSURED Care Coordinator Anticipated Follow Up Needs: HH/OP DC Planning Additional Notes/Comments: Patient uninsured Expected length of stay: 3-4 Weeks  Clinical Impression SW entered room introduced self, explained role and process. Pt mom at bedside, made sw aware she would be a bedside most days. Mom informed SW that she, spouse and sister will be primary caregivers of patient. Mom in process of finding someone to build ramp at home. SW informed pt mom of all current barriers and she understood. SW will continue to follow up with questions and concerns.   [name] 09/21/2020, 1:24 PM

## 2020-09-21 NOTE — Progress Notes (Signed)
Inpatient Rehabilitation Center Individual Statement of Services  Patient Name:  GURSHAN SETTLEMIRE  Date:  09/21/2020  Welcome to the Inpatient Rehabilitation Center.  Our goal is to provide you with an individualized program based on your diagnosis and situation, designed to meet your specific needs.  With this comprehensive rehabilitation program, you will be expected to participate in at least 3 hours of rehabilitation therapies Monday-Friday, with modified therapy programming on the weekends.  Your rehabilitation program will include the following services:  Physical Therapy (PT), Occupational Therapy (OT), Speech Therapy (ST), 24 hour per day rehabilitation nursing, Therapeutic Recreaction (TR), Neuropsychology, Care Coordinator, Rehabilitation Medicine, Nutrition Services, Pharmacy Services and Other  Weekly team conferences will be held on Wednesday to discuss your progress.  Your Inpatient Rehabilitation Care Coordinator will talk with you frequently to get your input and to update you on team discussions.  Team conferences with you and your family in attendance may also be held.  Expected length of stay: 3-4 Weeks  Overall anticipated outcome:  Min A  Depending on your progress and recovery, your program may change. Your Inpatient Rehabilitation Care Coordinator will coordinate services and will keep you informed of any changes. Your Inpatient Rehabilitation Care Coordinator's name and contact numbers are listed  below.  The following services may also be recommended but are not provided by the Inpatient Rehabilitation Center:    Home Health Rehabiltiation Services  Outpatient Rehabilitation Services    Arrangements will be made to provide these services after discharge if needed.  Arrangements include referral to agencies that provide these services.  Your insurance has been verified to be:  UNINSURED Your primary doctor is:  NO PCP  Pertinent information will be shared with your  doctor and your insurance company.  Inpatient Rehabilitation Care Coordinator:  Lavera Guise, [place] [phone] or [phone]  Information discussed with and copy given to patient by: [name], 09/21/2020, 9:29 AM

## 2020-09-21 NOTE — Progress Notes (Signed)
Patient ID: Jason Romero, male   DOB: 05-19-1971, 49 y.o.   MRN: [id number]   Copy of proof of speech deficits emailed to [name] to submit to [facility] to assist patient with a disability application.   [place], [place] [phone]

## 2020-09-21 NOTE — Progress Notes (Signed)
Inpatient Rehabilitation  Patient information reviewed and entered into eRehab system by Levon Boettcher M. Marijean Montanye, M.A., CCC/SLP, PPS Coordinator.  Information including medical coding, functional ability and quality indicators will be reviewed and updated through discharge.    

## 2020-09-21 NOTE — Patient Care Conference (Signed)
Inpatient RehabilitationTeam Conference and Plan of Care Update Date: 09/21/2020   Time: 10:07 AM    Patient Name: Jason Romero      Medical Record Number: [id number]  Date of Birth: [birth year] Sex: Male         Room/Bed: 4W03C/4W03C-01 Payor Info: Payor: MEDICAID POTENTIAL / Plan: MEDICAID POTENTIAL / Product Type: *No Product type* /    Admit Date/Time:  09/20/2020  4:04 PM  Primary Diagnosis:  ICH (intracerebral hemorrhage) [facility])  Hospital Problems: Principal Problem:   ICH (intracerebral hemorrhage) (HCC)    Expected Discharge Date: Expected Discharge Date:  (reconference next week)  Team Members Present: Physician leading conference: Dr. Claudette Laws Care Coodinator Present: Chana Bode, RN, BSN, CRRN;Christina Vita Barley, BSW Nurse Present: Other (comment) Joycelyn Das, RN) PT Present: Grier Rocher, PT OT Present: Lina Sayre, OT SLP Present: Suzzette Righter, CF-SLP PPS Coordinator present : Fae Pippin, [place], PT     Current Status/Progress Goal Weekly Team Focus  Bowel/Bladder   pt is incont/cont of B/b, LBM 09/21/20  pt will regain continence of b/b  Q2h toileting/ PRN   Swallow/Nutrition/ Hydration   eval pending         ADL's   Eval pending.         Mobility   Evaluation Pending  Evaluation pending      Communication   eval pending         Safety/Cognition/ Behavioral Observations  eval pending         Pain   pt c/o of generalized pain, PRN tylenol effevtive  Pt will be free of pain  Assess pain qshift. Adminiser PRN medications as needed   Skin   Abrasion to penis from condom cath  Pt will bre free of breakdown an further infection.  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  Discussed initiation of CBG monitoring with tube feeds and nicoderm patch for smoking cessation. Easily  distracted with good receptive skills and some automatic phrases noted with paraphasias. Currently aspiration risk, possible MBS next week.   Patient on target to meet rehab goals: yes,   *See Care Plan and progress notes for long and short-term goals.   Revisions to Treatment Plan:   Teaching Needs: Transfers, toileting, medications, precautions, etc.  Current Barriers to Discharge: Decreased caregiver support, Insurance for SNF coverage and Medication compliance  Possible Resolutions to Barriers: Education with wife and sister.     Medical Summary Current Status: Severe weakness, aphasia dysphagia, NPO  Barriers to Discharge: Medical stability;Nutrition means   Possible Resolutions to [employer] Focus: Med management for HTN, management of TF   Continued Need for Acute Rehabilitation Level of Care: The patient requires daily medical management by a physician with specialized training in physical medicine and rehabilitation for the following reasons: Direction of a multidisciplinary physical rehabilitation program to maximize functional independence : Yes Medical management of patient stability for increased activity during participation in an intensive rehabilitation regime.: Yes Analysis of laboratory values and/or radiology reports with any subsequent need for medication adjustment and/or medical intervention. : Yes   I attest that I was present, lead the team conference, and concur with the assessment and plan of the team.   Chana Bode B 09/21/2020, 12:43 PM

## 2020-09-21 NOTE — Progress Notes (Signed)
Orthopedic Tech Progress Note Patient Details:  Jason Romero 05-17-1971 [id number] Called in order to [employer] for a WALKING PRAFO and A RESTING HAND SPLINT  Patient ID: Jason Romero, male   DOB: Mar 22, 1971, 49 y.o.   MRN: [id number]   Donald Pore 09/21/2020, 3:03 PM

## 2020-09-21 NOTE — Evaluation (Signed)
Speech Language Pathology Assessment and Plan  Patient Details  Name: Jason Romero MRN: [id number] Date of Birth: 1971-02-11  SLP Diagnosis: Aphasia;Dysarthria;Dysphagia;Speech and Language deficits;Cognitive Impairments  Rehab Potential: Good ELOS: 3.5-4 weeks    Today's Date: 09/21/2020 SLP Individual Time: 8676-7209 SLP Individual Time Calculation (min): 55 min   Hospital Problem: Principal Problem:   ICH (intracerebral hemorrhage) (Jason Romero)  Past Medical History:  Past Medical History:  Diagnosis Date  . A-fib ([place])   . Chronic headaches   . COPD (chronic obstructive pulmonary disease) ([place])   . GERD (gastroesophageal reflux disease)   . History of blood transfusion   . Hypertension   . MI (myocardial infarction) ([place])   . Seizures ([place])    Past Surgical History:  Past Surgical History:  Procedure Laterality Date  . CHOLECYSTECTOMY      Assessment / Plan / Recommendation Clinical Impression   OBS:JGGEZ H.Lakinsis a 49 year old right-handed male with history of COPD/tobacco abuse with hypertension, history of seizures on no seizure medication, atrial fibrillation not on anticoagulation did not follow-up cardiology services. Per chart review lives with spouse independent prior to admission. 1 level home with level entry. Presented 09/11/2020 with right side weakness facial droop slumped over in his chair. Blood pressure 195/127. 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 No shift or hydrocephalus. CTA of head and neck no hemodynamically significant stenosis. Echocardiogram with ejection fraction of 50 to 66% grade 2 diastolic dysfunction. No regional wall motion abnormalities. Admission chemistries unremarkable except glucose 114 potassium 2.8 urine drug screen positive amphetamines and marijuana, urinalysis negative nitrite. Neurology follow-up initially placed on 3% saline. ICH felt to be related to hypertensive  crisis. Placed on Cleviprex for blood pressure control. Patient is currently n.p.o. with alternative means of nutritional support. Therapy evaluations completed and patient was admitted for a comprehensive rehab program 09/20/20 and SLP evaluations were completed 09/21/20 with results as follows:  Pt presents with significant oropharyngeal dysphagia, expressive aphasia, dysarthria, and moderate cognitive deficits. He was highly distractible and somewhat physically restless throughout session, although cooperative and followed basic directions well with verbal and visual cues. Pt produced 1 3-word automatic response/phrase during session, and approximated his first name and "pen", however pt's speech mostly characterized by neologisms and phonemic paraphasias. He required some assistance to use picture/word communication board to communicate need to use restroom and pain. He is able to clearly indicate yes/no response (sometimes verbal, other times head nods/shakes) to communicate preferences and needs when questions are phrased to him in that manner. Automatic speech tasks mostly unsuccessful other than attempting to tell me his name. Basic yes/no and identifying objects from field of 2 (via pointing) ~85-90% accurate.   Pt had thick dried secretions requiring intense oral care via suction prior to PO trials during bedside. Right anterior spillage of puree and thin H2O noted due to right facial weakness and reduced oral control of boluses. No overt s/sx aspiration noted in 10/10 ice chip trials, however intermittent wet vocal quality and immediate cough X2 noted out of 10 tsp trials, and strong immediate cough noted in 50% of conservative cup sips H2O (2/4). Would recommend continue NPO for now with temporary alternative means of nutrition. Pt also needs strict oral care QID and pt may have 1-3 ice chips after oral care 1-2x daily to assist with loosening thick secretions with RN only - SLP should be present  for all other PO intake/trials at this time.   Recommend  pt receive skilled ST while inpatient to address above listed deficits in order to maximize his functional communication, independence, and safety prior to discharge. Continue per current plan of care.    Skilled Therapeutic Interventions          Bedside swallow and cognitive-linguistic evaluations were administered and results were reviewed with pt (please see above for details regarding results).    SLP Assessment  Patient will need skilled Speech Lanaguage Pathology Services during CIR admission    Recommendations  SLP Diet Recommendations: NPO;Other (Comment);Alternative means - temporary (pt may have 1-3 ice chips to assist with oral care/moistening thick dried secretions) Medication Administration: Via alternative means Oral Care Recommendations: Oral care QID Patient destination: Home Follow up Recommendations: 24 hour supervision/assistance;Home Health SLP Equipment Recommended: None recommended by SLP    SLP Frequency 3 to 5 out of 7 days   SLP Duration  SLP Intensity  SLP Treatment/Interventions 3.5-4 weeks  Minumum of 1-2 x/day, 30 to 90 minutes  Cognitive remediation/compensation;Cueing hierarchy;Dysphagia/aspiration precaution training;Functional tasks;Internal/external aids;Multimodal communication approach;Speech/Language facilitation;Therapeutic Activities;Patient/family education    Pain Pain Assessment Pain Scale: Faces Faces Pain Scale: No hurt     SLP Evaluation Cognition Overall Cognitive Status: Impaired/Different from baseline Arousal/Alertness: Awake/alert Orientation Level: Oriented to person;Oriented to place;Oriented to time;Disoriented to situation (able to indicate this via yes/no when questions phrased in that manner due to language deficits) Attention: Sustained Sustained Attention: Impaired Sustained Attention Impairment: Verbal basic;Functional basic Memory:  (UTA due to  language) Awareness: Impaired Awareness Impairment: Intellectual impairment Problem Solving: Impaired Problem Solving Impairment: Verbal basic;Functional basic Executive Function:  (all impaired due to lower level deficits) Behaviors: Restless Safety/Judgment: Impaired  Comprehension Auditory Comprehension Overall Auditory Comprehension: Impaired Yes/No Questions: Impaired Basic Biographical Questions: 76-100% accurate Basic Immediate Environment Questions: 75-100% accurate Complex Questions: 75-100% accurate Commands: Impaired One Step Basic Commands: 75-100% accurate Complex Commands: 50-74% accurate Conversation: Simple Interfering Components: Attention Visual Recognition/Discrimination Discrimination: Not tested Reading Comprehension Reading Status: Not tested Expression Expression Primary Mode of Expression: Verbal Verbal Expression Overall Verbal Expression: Impaired Initiation: No impairment Automatic Speech: Name;Social Response Level of Generative/Spontaneous Verbalization: Phrase Repetition: Impaired Level of Impairment: Word level Naming: Impairment Responsive: Not tested Confrontation: Impaired Convergent: Not tested Divergent: Not tested Verbal Errors: Phonemic paraphasias;Neologisms Pragmatics: No impairment Interfering Components: Attention;Speech intelligibility Non-Verbal Means of Communication: Gestures;Communication board Written Expression Written Expression: Exceptions to [facility] Copy Ability: Word (able to copy with 90% accuracy) Dictation Ability: Word (impaired) Self Formulation Ability: Word (impaired) Oral Motor Oral Motor/Sensory Function Overall Oral Motor/Sensory Function: Moderate impairment Facial ROM: Reduced right;Suspected CN VII (facial) dysfunction Facial Symmetry: Abnormal symmetry right;Suspected CN VII (facial) dysfunction Facial Strength: Reduced right;Suspected CN VII (facial) dysfunction Facial Sensation: Reduced  right Lingual ROM: Other (Comment) (UTA - pt unable to follow command to protrude tongue) Lingual Symmetry: Other (Comment) (UTA) Lingual Strength: Within Functional Limits Mandible: Within Functional Limits Motor Speech Overall Motor Speech: Impaired Respiration: Within functional limits Phonation: Normal Resonance: Within functional limits Articulation: Impaired Level of Impairment: Word Intelligibility: Intelligibility reduced Word: 50-74% accurate Phrase: 50-74% accurate Sentence: Not tested Conversation: Not tested Motor Planning:  (needs further assessment) Motor Speech Errors: Inconsistent  Care Tool Care Tool Cognition Expression of Ideas and Wants Expression of Ideas and Wants: Frequent difficulty - frequently exhibits difficulty with expressing needs and ideas   Understanding Verbal and Non-Verbal Content Understanding Verbal and Non-Verbal Content: Usually understands - understands most conversations, but misses some part/intent of message. Requires cues at times to understand  Memory/Recall Ability *first 3 days only Memory/Recall Ability *first 3 days only: That he or she is in a hospital/hospital unit;Current season (accomodations made for language - indicated via yes/no answers)     Intelligibility: Intelligibility reduced Word: 50-74% accurate Phrase: 50-74% accurate Sentence: Not tested Conversation: Not tested  Bedside Swallowing Assessment General Date of Onset: 09/11/20 Previous Swallow Assessment: MBS 09/16/20 Diet Prior to this Study: NPO;NG Tube Temperature Spikes Noted: No Respiratory Status: Room air History of Recent Intubation: No Behavior/Cognition: Alert;Cooperative;Requires cueing;Distractible Oral Cavity - Dentition: Adequate natural dentition;Poor condition Self-Feeding Abilities: Needs assist Vision: Impaired for self-feeding (left gaze preference) Patient Positioning: Upright in bed Baseline Vocal Quality: Normal Volitional Cough:  Cognitively unable to elicit Volitional Swallow: Unable to elicit  Oral Care Assessment Does patient have any of the following "high(er) risk" factors?: Diet - patient on tube feedings;Nutritional status - fluids only or NPO for >24 hours;Saliva - insufficient, absent Does patient have any of the following "at risk" factors?: Nutritional status - dependent feeder;Saliva - thick, dry mouth;Tongue - coated;Other - dysphagia Patient is HIGH RISK: Non-ventilated: Order set for Adult Oral Care Protocol initiated - "High Risk Patients - Non-Ventilated" option selected  (see row information) Ice Chips Ice chips: Within functional limits Presentation: Spoon Thin Liquid Thin Liquid: Impaired Presentation: Cup;Spoon;Self Fed Oral Phase Impairments: Reduced labial seal;Reduced lingual movement/coordination;Poor awareness of bolus Oral Phase Functional Implications: Right anterior spillage Pharyngeal  Phase Impairments: Wet Vocal Quality;Cough - Immediate Nectar Thick Nectar Thick Liquid: Not tested Honey Thick Honey Thick Liquid: Not tested Puree Puree: Impaired Presentation: Spoon;Self Fed Oral Phase Impairments: Reduced labial seal;Reduced lingual movement/coordination Oral Phase Functional Implications: Right anterior spillage Solid Solid: Not tested BSE Assessment Risk for Aspiration Impact on safety and function: Severe aspiration risk;Risk for inadequate nutrition/hydration  Short Term Goals: Week 1: SLP Short Term Goal 1 (Week 1): Pt will accept therapeutic trials of thin H2O and purees with minimal overt s/sx aspiration X3 prior to repeat MBSS. SLP Short Term Goal 2 (Week 1): Pt will sustain attention to functional tasks for 5 minute intervals with Mod A cues for redirection. SLP Short Term Goal 3 (Week 1): Pt will repeat at the word level with 50% accuracy provided Max A multimodal cues. SLP Short Term Goal 4 (Week 1): Pt will produce automatic speech sequences with 75% accuracy  provided Max A multimodal cues. SLP Short Term Goal 5 (Week 1): Pt will demonstrate ability to use gestures and/or a communication board to communication basic wants and needs with Mod A multimodal cues.  Refer to Care Plan for Long Term Goals  Recommendations for other services: None   Discharge Criteria: Patient will be discharged from SLP if patient refuses treatment 3 consecutive times without medical reason, if treatment goals not met, if there is a change in medical status, if patient makes no progress towards goals or if patient is discharged from hospital.  The above assessment, treatment plan, treatment alternatives and goals were discussed and mutually agreed upon: by patient  Arbutus Leas 09/21/2020, 10:21 AM

## 2020-09-22 ENCOUNTER — Inpatient Hospital Stay (HOSPITAL_COMMUNITY): Payer: Self-pay

## 2020-09-22 ENCOUNTER — Inpatient Hospital Stay (HOSPITAL_COMMUNITY): Payer: Self-pay | Admitting: Occupational Therapy

## 2020-09-22 ENCOUNTER — Inpatient Hospital Stay (HOSPITAL_COMMUNITY): Payer: Self-pay | Admitting: Physical Therapy

## 2020-09-22 LAB — GLUCOSE, CAPILLARY
Glucose-Capillary: 124 mg/dL — ABNORMAL HIGH (ref 70–99)
Glucose-Capillary: 148 mg/dL — ABNORMAL HIGH (ref 70–99)

## 2020-09-22 NOTE — Progress Notes (Signed)
Patient rested well throughout the night, had one incontinent episode, was cleaned and settled back in bed. Patients boot and splint were placed on pt, tolerated well.

## 2020-09-22 NOTE — Progress Notes (Signed)
Speech Language Pathology Daily Session Note  Patient Details  Name: Jason Romero MRN: [id number] Date of Birth: July 23, 1971  Today's Date: 09/22/2020 SLP Individual Time: 0850-0930 SLP Individual Time Calculation (min): 40 min  Short Term Goals: Week 1: SLP Short Term Goal 1 (Week 1): Pt will accept therapeutic trials of thin H2O and purees with minimal overt s/sx aspiration X3 prior to repeat MBSS. SLP Short Term Goal 2 (Week 1): Pt will sustain attention to functional tasks for 5 minute intervals with Mod A cues for redirection. SLP Short Term Goal 3 (Week 1): Pt will repeat at the word level with 50% accuracy provided Max A multimodal cues. SLP Short Term Goal 4 (Week 1): Pt will produce automatic speech sequences with 75% accuracy provided Max A multimodal cues. SLP Short Term Goal 5 (Week 1): Pt will demonstrate ability to use gestures and/or a communication board to communication basic wants and needs with Mod A multimodal cues.  Skilled Therapeutic Interventions: Skilled SLP intervention focused on language and dysphagia. Increased attempts to communicate verbally as session progressed. Pt gestured yes/no and occasionally verbalized "yes/no" reliably to simple questions with objects in room with 85% accuracy and min A with verbal cues. Pt repeated  at the word level with 25% accuracy and max A verbal and visual models. He was able to say his name in response to question and responded "better" when asked, how are feeling today? Pt able to match object with written word and identify object in a field of 2 when given 1 written word. Oral care completed at end of session. He tolerated 5 ice chips with no overt s/sx of aspiration or penetration. Voice was clear after all trials. Cont with therapy per plan of care.      Pain Pain Assessment Pain Scale: Faces Faces Pain Scale: No hurt  Therapy/Group: Individual Therapy  [name] Trenika Hudson 09/22/2020, 9:27 AM

## 2020-09-22 NOTE — Progress Notes (Signed)
Occupational Therapy Session Note  Patient Details  Name: Jason Romero MRN: [id number] Date of Birth: [birth year]  Today's Date: 09/22/2020 OT Individual Time: 0174-9449 OT Individual Time Calculation (min): 48 min    Short Term Goals: Week 1:  OT Short Term Goal 1 (Week 1): Patient will maintain static sitting balance at EOB with Min A in prep for ADLs. OT Short Term Goal 2 (Week 1): Patient will complete 2/3 parts of UB dressing task with use of hemi techinque. OT Short Term Goal 3 (Week 1): Patient complete 1/3 parts of LB dressing task seated EOB. OT Short Term Goal 4 (Week 1): Patient will complete 2/3 grooming tasks seated at sink level.  Skilled Therapeutic Interventions/Progress Updates:  Patient met lying supine in bed. Increased encouragement for participation with therapy this session. No facial grimacing with movement to indicate pain. Supine to EOB with assist to advance LLE toward EOB and bring trunk upright. Assist also required for anterior scoot toward EOB with use of chuck pad. Initially, patient required external assist and multimodal cues to maintain static sitting balance at EOB demonstrating R and anterior LOB. UB dressing seated EOB with assist to thread RUE 2/2 R inattention/neglect. Patient able to thread LUE and pull shirt over head with assist to pull shirt down over chest/abdomen. Patient able to maintain dynamic sitting balance with Mod A and multimodal cues for orientation to midline during UB dressing and to don footwear. As session progressed, agitation/impulsivity increased. Patient declined transfer to [place] spontaneously attempting to return to supine. Patient assisted at RLE and trunk. Session concluded with patient lying supine in bed with call bell within reach, bed alarm activated, and all needs met.   Therapy Documentation Precautions:  Precautions Precautions: Fall Precaution Comments: R hemiplegia, R inattention Restrictions Weight Bearing  Restrictions: No General:    Therapy/Group: Individual Therapy  [name] 09/22/2020, 12:31 PM

## 2020-09-22 NOTE — Progress Notes (Signed)
Speech Language Pathology Daily Session Note  Patient Details  Name: Jason Romero MRN: [id number] Date of Birth: [birth year]  Today's Date: 09/22/2020 SLP Individual Time: 1240-1300 SLP Individual Time Calculation (min): 20 min  Short Term Goals: Week 1: SLP Short Term Goal 1 (Week 1): Pt will accept therapeutic trials of thin H2O and purees with minimal overt s/sx aspiration X3 prior to repeat MBSS. SLP Short Term Goal 2 (Week 1): Pt will sustain attention to functional tasks for 5 minute intervals with Mod A cues for redirection. SLP Short Term Goal 3 (Week 1): Pt will repeat at the word level with 50% accuracy provided Max A multimodal cues. SLP Short Term Goal 4 (Week 1): Pt will produce automatic speech sequences with 75% accuracy provided Max A multimodal cues. SLP Short Term Goal 5 (Week 1): Pt will demonstrate ability to use gestures and/or a communication board to communication basic wants and needs with Mod A multimodal cues.  Skilled Therapeutic Interventions: Skilled SLP intervention focused on dysphagia. Mother present during session and given update on patients performance during morning session. Pt very lethargic but eventually woke up to participate in tx. Pt completed oral care with sponge swab and suction independently. Pt fed by slp wit trials of ice chips. Demonstrated adequate labial seal and oral clearance. Voice was clear after all trials. No overt s/sx of aspiration or penetration noted. Cont with therapy per plan of care. Informed pt and mother of plan to complete repeat MBSS next week.     Pain Pain Assessment Pain Scale: Faces Faces Pain Scale: No hurt  Therapy/Group: Individual Therapy  Jason Romero 09/22/2020, 2:23 PM

## 2020-09-22 NOTE — Progress Notes (Signed)
[place] PHYSICAL MEDICINE & REHABILITATION PROGRESS NOTE   Subjective/Complaints: No complaints this morning Appreciate nursing note, patient rested well at night Had one incontinent episode Tolerated boot and splint well  ROS- unable to obtain due to aphasia   Objective:   No results found. Recent Labs    09/21/20 0410  WBC 9.3  HGB 13.2  HCT 41.5  PLT 271   Recent Labs    09/20/20 0330 09/21/20 0410  NA 148* 147*  K 3.8 4.0  CL 116* 116*  CO2 21* 20*  GLUCOSE 150* 137*  BUN 42* 38*  CREATININE 1.07 1.10  CALCIUM 8.9 8.8*    Intake/Output Summary (Last 24 hours) at 09/22/2020 0935 Last data filed at 09/22/2020 8657 Gross per 24 hour  Intake 0 ml  Output 2 ml  Net -2 ml        Physical Exam: Vital Signs Blood pressure 135/87, pulse 84, temperature 97.8 F (36.6 C), resp. rate 18, height 6' 1.5" (1.867 m), weight 109.3 kg, SpO2 95 %. General: Somnolent but arousable, No apparent distress HEENT: Head is normocephalic, +NGT Neck: Supple without JVD or lymphadenopathy Heart: Reg rate and rhythm. No murmurs rubs or gallops Chest: CTA bilaterally without wheezes, rales, or rhonchi; no distress Abdomen: Soft, non-tender, non-distended, bowel sounds positive. Extremities: No clubbing, cyanosis, or edema. Pulses are 2+  Skin: No evidence of breakdown, no evidence of rash Neurologic: Cranial nerves II through XII intact, motor strength is 5/5 inleft , 0/5 RIght  deltoid, bicep, tricep, grip, hip flexor, knee extensors, ankle dorsiflexor and plantar flexor Sensory exam absent pinch RUE and RLE  Musculoskeletal: no pain with range of motion in all 4 extremities. No joint swelling   Assessment/Plan: 1. Functional deficits which require 3+ hours per day of interdisciplinary therapy in a comprehensive inpatient rehab setting.  Physiatrist is providing close team supervision and 24 hour management of active medical problems listed below.  Physiatrist and  rehab team continue to assess barriers to discharge/monitor patient progress toward functional and medical goals  Care Tool:  Bathing    Body parts bathed by patient: Right arm, Chest, Abdomen, Front perineal area, Left upper leg, Face (UB EOB, LB bed level)   Body parts bathed by helper: Left arm, Buttocks, Right upper leg, Right lower leg, Left lower leg     Bathing assist Assist Level: Moderate Assistance - Patient 50 - 74%     Upper Body Dressing/Undressing Upper body dressing   What is the patient wearing?: Pull over shirt    Upper body assist Assist Level: Moderate Assistance - Patient 50 - 74%    Lower Body Dressing/Undressing Lower body dressing      What is the patient wearing?: Pants, Incontinence brief     Lower body assist Assist for lower body dressing: Moderate Assistance - Patient 50 - 74%     Toileting Toileting Toileting Activity did not occur Press photographer and hygiene only):  (2 assit stedy)  Toileting assist Assist for toileting: 2 Helpers     Transfers Chair/bed transfer  Transfers assist     Chair/bed transfer assist level: Total Assistance - Patient < 25%     Locomotion Ambulation   Ambulation assist   Ambulation activity did not occur: Safety/medical concerns          Walk 10 feet activity   Assist  Walk 10 feet activity did not occur: Safety/medical concerns        Walk 50 feet activity   Assist  Walk 50 feet with 2 turns activity did not occur: Safety/medical concerns         Walk 150 feet activity   Assist Walk 150 feet activity did not occur: Safety/medical concerns         Walk 10 feet on uneven surface  activity   Assist Walk 10 feet on uneven surfaces activity did not occur: Safety/medical concerns         Wheelchair     Assist        Wheelchair assist level: Dependent - Patient 0% Max wheelchair distance: 150    Wheelchair 50 feet with 2 turns activity    Assist         Assist Level: Dependent - Patient 0%   Wheelchair 150 feet activity     Assist      Assist Level: Dependent - Patient 0%   Blood pressure 135/87, pulse 84, temperature 97.8 F (36.6 C), resp. rate 18, height 6' 1.5" (1.867 m), weight 109.3 kg, SpO2 95 %.    Medical Problem List and Plan: 1.  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  Antithrombotics: -DVT/anticoagulation: SCDs             -antiplatelet therapy: N/A 3. Pain Management: Tylenol as needed. Does not appear to be in pain.  4. Mood: Provide emotional support             -antipsychotic agents: N/A 5. Neuropsych: This patient is not capable of making decisions on his own behalf. 6. Skin/Wound Care: Routine skin checks 7. Fluids/Electrolytes/Nutrition: Routine in and outs with follow-up chemistries 8.  Dysphagia.  NPO.  Alternative means of nutritional support.  Speech therapy follow-up, may need PEG if slow improvement  9.  Hypertension.  Norvasc 10 mg daily, hydralazine 100 mg every 8 hours, Normodyne 300 mg every 8 hours, lisinopril 20 mg twice daily.  Monitor with increased mobility Vitals:   09/22/20 0724 09/22/20 0726  BP: 135/87 135/87  Pulse:  84  Resp:    Temp:    SpO2:  95%  Good control on current meds.  10.  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  Provide counseling 11.  Obesity.  BMI 30.67-->31.36  Dietary follow-up    LOS: 2 days A FACE TO FACE EVALUATION WAS PERFORMED  Clint Bolder P Manya Balash 09/22/2020, 9:35 AM

## 2020-09-22 NOTE — Progress Notes (Signed)
Physical Therapy Session Note  Patient Details  Name: Jason Romero MRN: [id number] Date of Birth: 13-Jul-1971  Today's Date: 09/22/2020 PT Individual Time: 1030-1100 PT Individual Time Calculation (min): 30 min   Short Term Goals: Week 1:  PT Short Term Goal 1 (Week 1): Pt will perfom bed mobility with mod assist and use of bed features PT Short Term Goal 2 (Week 1): Pt will propell WC 15ft with min assist PT Short Term Goal 3 (Week 1): Pt will tolerate sitting up in WC 2 hours between therapies PT Short Term Goal 4 (Week 1): Pt will perform sitting balance EOB with mod assist from PT up to 5 minutes  Skilled Therapeutic Interventions/Progress Updates:    Pt received supine in bed, nods head 'yes' to PT session, shakes head 'no' when asked about pain. Pt's mom at bedside. PT retrieved TIS w/c due to his significant postural deficits. Pt with saturated brief on arrival, incontinent of bladder. Required totalA for removing dirty briefs and totalA for pericare. Performed rolling R with minA and requires totalA for rolling L. Required totalA for donning socks, maxA for pants. Performed supine<>sit with maxA with HOB flat, use of bedrail. Required maxA for sitting balance due to significant lack of core control. Sitting postural deficits include forward head, sacral sitting, and kyphotic posture. Positioned Stedy in front of him while he sat EOB and instructed him to attempt transfer training. Pt reports "no" and shakes head when asked; unable to provide reasoning 2/2 aphasia. Due to time constraints, unable to further assess and mobilize pt OOB safely. Therefore, returned to supine in bed requiring maxA, pt neglecting R hemibody during bed mobility. Pt positioned comfortably in bed with 3/4 rails up, needs in reach, bed alarm on.  Therapy Documentation Precautions:  Precautions Precautions: Fall Precaution Comments: R hemiplegia, R inattention Restrictions Weight Bearing Restrictions:  No  Therapy/Group: Individual Therapy  Viva Gallaher P Robinson Brinkley PT 09/22/2020, 12:05 PM

## 2020-09-23 ENCOUNTER — Inpatient Hospital Stay (HOSPITAL_COMMUNITY): Payer: Self-pay | Admitting: Occupational Therapy

## 2020-09-23 ENCOUNTER — Inpatient Hospital Stay (HOSPITAL_COMMUNITY): Payer: Self-pay

## 2020-09-23 LAB — GLUCOSE, CAPILLARY
Glucose-Capillary: 107 mg/dL — ABNORMAL HIGH (ref 70–99)
Glucose-Capillary: 125 mg/dL — ABNORMAL HIGH (ref 70–99)
Glucose-Capillary: 151 mg/dL — ABNORMAL HIGH (ref 70–99)
Glucose-Capillary: 159 mg/dL — ABNORMAL HIGH (ref 70–99)

## 2020-09-23 MED ORDER — HYDRALAZINE HCL 50 MG PO TABS
75.0000 mg | ORAL_TABLET | Freq: Three times a day (TID) | ORAL | Status: DC
  Administered 2020-09-23 – 2020-09-26 (×9): 75 mg
  Filled 2020-09-23 (×9): qty 1

## 2020-09-23 NOTE — Progress Notes (Signed)
Pt refused to wear splint for RT arm.

## 2020-09-23 NOTE — Progress Notes (Signed)
Physical Therapy Session Note  Patient Details  Name: Jason Romero MRN: [id number] Date of Birth: [birth year]  Today's Date: 09/23/2020 PT Individual Time: 7412-8786 PT Individual Time Calculation (min): 69 min   Short Term Goals: Week 1:  PT Short Term Goal 1 (Week 1): Pt will perfom bed mobility with mod assist and use of bed features PT Short Term Goal 2 (Week 1): Pt will propell WC 158ft with min assist PT Short Term Goal 3 (Week 1): Pt will tolerate sitting up in WC 2 hours between therapies PT Short Term Goal 4 (Week 1): Pt will perform sitting balance EOB with mod assist from PT up to 5 minutes  Skilled Therapeutic Interventions/Progress Updates:    Pt received supine in bed, wife at bedside, pt nods head 'yes' to PT session, does not appear to be in pain; aphasia limiting. Pt with heavily saturated brief on arrival. Required totalA for removing dirty brief and pants. Provided pt with soaped washcloth to perform pericare but pt reports "no" and hands washcloth back to therapist. Wife intervenes and assists with washing patient up, appears very involved and wanting to help. Wife assisted with bed mobility, required minA for rolling R and totalA for rolling L. Donned new brief and pants with totalA +2. Pt with thigh-high TED's on during session. RN called to disconnect dobhoff feeding tube for safety. Performed supine<>sit with maxA +2 with HOB flat, requiring maxA +2 for initial sitting balance, progressing to modA +2 2/2 R lateral and posterior lean. Placed Stedy in front of patient requiring totalA for R foot placement. Performed sit<>stand with modA +2 in [place] with cues for initiation and tactile cues for erect posture. Transferred in perched position on Stedy to TIS w/c. W/c transport for time management from his room to main therapy gym. Performed Stedy transfer in similar method as outlined above from w/c to mat table. With feet supported, pt able to sit with minA of +1 assist. Placed  mirror in front of patient for visual feedback to assist with midline orientation; with mirror, pt appears frustrated and rubbing his face, maybe displeased with appearance 2/2 stroke. Therefore, turned mirror around and worked on reaching with LUE for card [employer]. He required modA for sitting balance while completing this with cards placed to his R side to promote R attention. After completing only 3 cards, he reports "no" when asked to continue matching game, unable to provide reasoning 2/2 aphasia. BP assessed to rule out orthostasis, reading 143/87. Provided patient with other options for interventions but continued to decline. Pt somewhat agreeable to ball toss, so therapist providing modA guard while +2 assist threw light ball to him in sitting. Pt was able to maintain his sitting balance with minA while doing this but showed decreased ability to reaching outside BOS. Sometimes, he would also throw himself back onto mat table, appearing to want to rest. Provided large wedge for back support as he rested. Pt pointing back to his room, appearing to request to return. Unable to successfully redirect to participate in further interventions, therefore, Stedy transfer back to his w/c with +2 assist and returned to his room where he remained reclined in TIS w/c with needs in reach, safety belt alarm on, wife at bedside. RN notified after session of pt's mobility with therapy and wife educated on purpose of safety belt.   Therapy Documentation Precautions:  Precautions Precautions: Fall Precaution Comments: R hemiplegia, R inattention Restrictions Weight Bearing Restrictions: No  Therapy/Group: Individual Therapy  Eben Choinski P Lev Cervone PT 09/23/2020, 3:25 PM

## 2020-09-23 NOTE — Progress Notes (Signed)
Occupational Therapy Session Note  Patient Details  Name: Jason Romero MRN: [id number] Date of Birth: [birth year]  Today's Date: 09/23/2020 OT Individual Time: 9371-6967 OT Individual Time Calculation (min): 55 min    Short Term Goals: Week 1:  OT Short Term Goal 1 (Week 1): Patient will maintain static sitting balance at EOB with Min A in prep for ADLs. OT Short Term Goal 2 (Week 1): Patient will complete 2/3 parts of UB dressing task with use of hemi techinque. OT Short Term Goal 3 (Week 1): Patient complete 1/3 parts of LB dressing task seated EOB. OT Short Term Goal 4 (Week 1): Patient will complete 2/3 grooming tasks seated at sink level.  Skilled Therapeutic Interventions/Progress Updates:    Pt supine,asleep in bed with wife at bedside throughout session.  OT provided warm wash cloth and hand over hand using left upper extremity to wash face and to facilitate increased pt alertness.  Nurse disconnected NG tube briefly during session to allow for safe UB dressing.  Pt able to respond appropriately throughout session with yes/no answers for communication.    Pt noted to have soiled brief with urine, therefore completion of bed mobility, clothing mgt, and pericare with pt needing step by step VCs for body mechanics during rolling left and right.  Pt needed min assist to roll to the right using bed rail and total assist to roll to left due to right hemiparesis. Pt needed total assist for clothing mgt and mod assist for pericare.  Pt bathed LB with max assist, able to wash peri region and top of LLE, with OT assisting for buttocks RLE and left foot.  Pt needed max assist to donn pants at bed level.  OT provided didactic and visual demo of hemitechnique to thread RLE first for increased ease.  Total assist needed to donn TED hose, socks, and shoes.  Total assist +2 needed supine to sit with pt exhibiting significant over correction to right when pushing to seated position needing max assist to  correct to midline.  Pt doffed gown and donned shirt with max assist with max step by step VCs for hemitechnique. Pt visibly easily frustrated and attempting to complete task quickly with poor carryover of hemitechnique needing max assist for successful completion.  BP taken seated: 136/83 pulse 79.  Pt encouraged to transfer to TIS, however pt attempting to lay back down and shaking his head no in response.  Pt needed max VCs and TCs to avoid impulsive and unsafe return to supine and required total assist +2 to complete sit to supine.  Repositioned with max assist. Educated pt on benefits and importance of upright sitting and TIS features to allow for repositioning as needed to encourage increased participation OOB during future sessions.  Call bell in reach, bed alarm on.   Therapy Documentation Precautions:  Precautions Precautions: Fall Precaution Comments: R hemiplegia, R inattention Restrictions Weight Bearing Restrictions: No   Therapy/Group: Individual Therapy  Amie Critchley 09/23/2020, 10:56 AM

## 2020-09-23 NOTE — Progress Notes (Signed)
Physical Therapy Session Note  Patient Details  Name: Jason Romero MRN: [id number] Date of Birth: [birth year]  Today's Date: 09/22/2020 PT Individual Time:1300-1330   30 min   Short Term Goals: Week 1:  PT Short Term Goal 1 (Week 1): Pt will perfom bed mobility with mod assist and use of bed features PT Short Term Goal 2 (Week 1): Pt will propell WC 180f with min assist PT Short Term Goal 3 (Week 1): Pt will tolerate sitting up in WC 2 hours between therapies PT Short Term Goal 4 (Week 1): Pt will perform sitting balance EOB with mod assist from PT up to 5 minutes  Skilled Therapeutic Interventions/Progress Updates:   Pt received supine in bed and agreeable to PT. In proir session pt reported dizziness in sitting. PT donned thigh high teds to assist with possible orthostatic hypotension management. s Supine>sit transfer with max assist for safety and control of trhe RUE/RLE. Sitting balance EOB with max assist progressing to min assist x 15 minutes. PT performed orthostatic BP assessment.  Supine: 134/87. Sitting 0 min 116/60, 3 min, 134/97. 10 minutes 147/110. With increasing s/s of dizziness and HA per yes/no by pt.  Returned to supine with max assist: 141/100. Pt left supine in bed with call bell in recah and all needs met.  RN made aware of BP.         Therapy Documentation Precautions:  Precautions Precautions: Fall Precaution Comments: R hemiplegia, R inattention Restrictions Weight Bearing Restrictions: No General:   Vital Signs: Therapy Vitals Pulse Rate: 86 BP: 117/71 Oxygen Therapy SpO2: 98 % Pain: Denies.    Therapy/Group: Individual Therapy  ALorie Phenix11/10/2020, 8:47 AM

## 2020-09-23 NOTE — Progress Notes (Signed)
[place] PHYSICAL MEDICINE & REHABILITATION PROGRESS NOTE   Subjective/Complaints: No complaints this morning Appears more alert.  +NGT R foot in boot  ROS- unable to obtain due to aphasia   Objective:   No results found. Recent Labs    09/21/20 0410  WBC 9.3  HGB 13.2  HCT 41.5  PLT 271   Recent Labs    09/21/20 0410  NA 147*  K 4.0  CL 116*  CO2 20*  GLUCOSE 137*  BUN 38*  CREATININE 1.10  CALCIUM 8.8*    Intake/Output Summary (Last 24 hours) at 09/23/2020 1100 Last data filed at 09/23/2020 0900 Gross per 24 hour  Intake 70 ml  Output --  Net 70 ml        Physical Exam: Vital Signs Blood pressure 111/69, pulse 86, temperature 98.2 F (36.8 C), temperature source Oral, resp. rate 18, height 6' 1.5" (1.867 m), weight 108.1 kg, SpO2 98 %.  General: More alert today, No apparent distress HEENT: Head is normocephalic, +NGT Neck: Supple without JVD or lymphadenopathy Heart: Reg rate and rhythm. No murmurs rubs or gallops Chest: CTA bilaterally without wheezes, rales, or rhonchi; no distress Abdomen: Soft, non-tender, non-distended, bowel sounds positive. Extremities: No clubbing, cyanosis, or edema. Pulses are 2+  Skin: No evidence of breakdown, no evidence of rash Neurologic: Cranial nerves II through XII intact, motor strength is 5/5 inleft , 0/5 RIght  deltoid, bicep, tricep, grip, hip flexor, knee extensors, ankle dorsiflexor and plantar flexor Sensory exam absent pinch RUE and RLE  Musculoskeletal: no pain with range of motion in all 4 extremities. No joint swelling   Assessment/Plan: 1. Functional deficits which require 3+ hours per day of interdisciplinary therapy in a comprehensive inpatient rehab setting.  Physiatrist is providing close team supervision and 24 hour management of active medical problems listed below.  Physiatrist and rehab team continue to assess barriers to discharge/monitor patient progress toward functional and medical  goals  Care Tool:  Bathing    Body parts bathed by patient: Right arm, Chest, Abdomen, Front perineal area, Left upper leg, Face (UB EOB, LB bed level)   Body parts bathed by helper: Left arm, Buttocks, Right upper leg, Right lower leg, Left lower leg     Bathing assist Assist Level: Moderate Assistance - Patient 50 - 74%     Upper Body Dressing/Undressing Upper body dressing   What is the patient wearing?: Pull over shirt    Upper body assist Assist Level: Moderate Assistance - Patient 50 - 74%    Lower Body Dressing/Undressing Lower body dressing      What is the patient wearing?: Incontinence brief     Lower body assist Assist for lower body dressing: Moderate Assistance - Patient 50 - 74%     Toileting Toileting Toileting Activity did not occur Press photographer and hygiene only):  (2 assit stedy)  Toileting assist Assist for toileting: 2 Helpers     Transfers Chair/bed transfer  Transfers assist     Chair/bed transfer assist level: Total Assistance - Patient < 25%     Locomotion Ambulation   Ambulation assist   Ambulation activity did not occur: Safety/medical concerns          Walk 10 feet activity   Assist  Walk 10 feet activity did not occur: Safety/medical concerns        Walk 50 feet activity   Assist Walk 50 feet with 2 turns activity did not occur: Safety/medical concerns  Walk 150 feet activity   Assist Walk 150 feet activity did not occur: Safety/medical concerns         Walk 10 feet on uneven surface  activity   Assist Walk 10 feet on uneven surfaces activity did not occur: Safety/medical concerns         Wheelchair     Assist Will patient use wheelchair at discharge?: Yes Type of Wheelchair: Manual    Wheelchair assist level: Dependent - Patient 0% Max wheelchair distance: 150    Wheelchair 50 feet with 2 turns activity    Assist        Assist Level: Dependent - Patient 0%    Wheelchair 150 feet activity     Assist      Assist Level: Dependent - Patient 0%   Blood pressure 111/69, pulse 86, temperature 98.2 F (36.8 C), temperature source Oral, resp. rate 18, height 6' 1.5" (1.867 m), weight 108.1 kg, SpO2 98 %.    Medical Problem List and Plan: 1.  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  Antithrombotics: -DVT/anticoagulation: SCDs             -antiplatelet therapy: N/A 3. Pain Management: Tylenol as needed. Does not appear to be in pain.  4. Mood: Provide emotional support             -antipsychotic agents: N/A 5. Neuropsych: This patient is not capable of making decisions on his own behalf. 6. Skin/Wound Care: Routine skin checks 7. Fluids/Electrolytes/Nutrition: Routine in and outs with follow-up chemistries 8.  Dysphagia.  NPO.  Alternative means of nutritional support.  Speech therapy follow-up, may need PEG if slow improvement  9.  Hypertension.  Norvasc 10 mg daily, hydralazine 100 mg every 8 hours, Normodyne 300 mg every 8 hours, lisinopril 20 mg twice daily.  Monitor with increased mobility Vitals:   09/23/20 0626 09/23/20 0856  BP: 117/71 111/69  Pulse: 86   Resp:    Temp:    SpO2: 98%   Well controlled. Decrease Hydralazine to 75mg  TID.  10.  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  Provide counseling 11.  Obesity.  BMI 30.67-->31.36-->31.02  Dietary follow-up    LOS: 3 days A FACE TO FACE EVALUATION WAS PERFORMED  Kastin Cerda P Yonael Tulloch 09/23/2020, 11:00 AM

## 2020-09-23 NOTE — Progress Notes (Signed)
Speech Language Pathology Daily Session Note  Patient Details  Name: Jason Romero MRN: [id number] Date of Birth: 1971-01-26  Today's Date: 09/23/2020 SLP Individual Time: 0931-1030 SLP Individual Time Calculation (min): 59 min  Short Term Goals: Week 1: SLP Short Term Goal 1 (Week 1): Pt will accept therapeutic trials of thin H2O and purees with minimal overt s/sx aspiration X3 prior to repeat MBSS. SLP Short Term Goal 2 (Week 1): Pt will sustain attention to functional tasks for 5 minute intervals with Mod A cues for redirection. SLP Short Term Goal 3 (Week 1): Pt will repeat at the word level with 50% accuracy provided Max A multimodal cues. SLP Short Term Goal 4 (Week 1): Pt will produce automatic speech sequences with 75% accuracy provided Max A multimodal cues. SLP Short Term Goal 5 (Week 1): Pt will demonstrate ability to use gestures and/or a communication board to communication basic wants and needs with Mod A multimodal cues.  Skilled Therapeutic Interventions: Pt seen for ongoing treatment of speech/language and dysphagia goals. Significant other present during session and provided updates on patient performance and goals of care. Pt completed oral care with sponge swab and suction independently x2 occasions during session. SLP providing trials single ice chips via spoon. Pt demonstrating one large, strong coughing episode following voice quality check after initial ice chip, no additional s/s aspiration with any trials. Pt with 1 episode R anterior loss of bolus with no awareness, labial seal adequate during all other trials. Pt and wife re-educated on recommendations for QID oral care and repeat MBSS next week, both verbalized understanding.   Pt demonstrating increasing attempts to communicate verbally, mostly unintelligible but "yes" and "no" consistently understood. Simple yes/no question task with 50% accuracy this date, max multimodal cues increased to 70%. Patient reporting  lethargy impacting accuracy. Pt indicating wanting to utilize white board to write, producing mostly written neologisms and partial word repetitions. SLP writing patient's name, patient attempting to copy but unable to produce any letters accurately. Pt and significant other desire to utilize note cards to increase communication, SLP providing high frequency words to practice (I.e. hot, cold, pain, adjust, mouth, ice chips, bathroom). Pt able to correctly ID which card to point to/hold up to family and staff in 75% of trials. Pt left in bed with alarm set and all needs met. Cont ST POC.    Pain Pain Assessment Pain Scale: Faces Faces Pain Scale: No hurt  Therapy/Group: Individual Therapy  Dewaine Conger 09/23/2020, 10:24 AM

## 2020-09-23 NOTE — Progress Notes (Signed)
Offered oral care, pt refused.

## 2020-09-24 ENCOUNTER — Inpatient Hospital Stay (HOSPITAL_COMMUNITY): Payer: Self-pay | Admitting: Physical Therapy

## 2020-09-24 ENCOUNTER — Inpatient Hospital Stay (HOSPITAL_COMMUNITY): Payer: Self-pay

## 2020-09-24 LAB — GLUCOSE, CAPILLARY
Glucose-Capillary: 161 mg/dL — ABNORMAL HIGH (ref 70–99)
Glucose-Capillary: 107 mg/dL — ABNORMAL HIGH (ref 70–99)

## 2020-09-24 NOTE — Progress Notes (Signed)
Physical Therapy Session Note  Patient Details  Name: Jason Romero MRN: [id number] Date of Birth: 23-Nov-1970  Today's Date: 09/24/2020 PT Individual Time: 8841-6606 and 1445-1527 PT Individual Time Calculation (min): 45 min 42 min   Short Term Goals: Week 1:  PT Short Term Goal 1 (Week 1): Pt will perfom bed mobility with mod assist and use of bed features PT Short Term Goal 2 (Week 1): Pt will propell WC 142f with min assist PT Short Term Goal 3 (Week 1): Pt will tolerate sitting up in WC 2 hours between therapies PT Short Term Goal 4 (Week 1): Pt will perform sitting balance EOB with mod assist from PT up to 5 minutes  Skilled Therapeutic Interventions/Progress Updates:  Session 1 Pt received supine in bed and agreeable to PT. Supine>sit transfer with max assist to facilitate trunkal activation.  Sitting balance EOB x 5 minutes sqaut pivot transfer to WC with max assist to block RLE. [facility] Sit>stand in standing frame x 2 bouts. Pt able to tolerate 10 seconds and 30 seconds in standing respectively. Following first bout pt noted to be very flushed in the face; PT used fan to help with anxiety in standing, allowing pt to tolerate increased time in standing on second bout. Pt returned to room and performed squat pivot transfer to bed with mod assist and RLE blocked. Sit>supine completed with max assist, and left supine in bed with call bell in reach and all needs met.    Session 2.  Pt received supine in bed and agreeable to PT. Pt noted to have been incontinent of bladder. Rolling R and L with min assist to the R and max assist-total A L to doff and don clean brief. PT instructed pt in supine NMR to perform bridge x 10 with increasing activation noted in the RLE with increased repetitions.  Hip/knee flexion/extension in symmetry 2 x 20 BLE with intermittent activation of the RLE into extension. Hip adduction/adduciton with Bil knee bent, AAROM with trace  activation noted in each direction intermittently. Lumber/pelvoc rotation progressing from AROM to dynamic reversal PNF facilitation. trunkal NMR to facilitate shoulder girdle activation with roll to the L with total A progressing to mod assist. Pt then progressed to roll from supine to L side with total A progressing to max assist to initiate movement.   Pt left supine in bed with call bell in reach and all needs met.            Therapy Documentation Precautions:  Precautions Precautions: Fall Precaution Comments: R hemiplegia, R inattention Restrictions Weight Bearing Restrictions: No    Pain: Pain Assessment Pain Scale: Faces Faces Pain Scale: No hurt    Therapy/Group: Individual Therapy  ALorie Phenix11/13/2021, 3:12 PM

## 2020-09-24 NOTE — Progress Notes (Signed)
Speech Language Pathology Daily Session Note  Patient Details  Name: Jason Romero MRN: [id number] Date of Birth: 1970-11-21  Today's Date: 09/24/2020 SLP Individual Time: 4098-1191 SLP Individual Time Calculation (min): 41 min  Short Term Goals: Week 1: SLP Short Term Goal 1 (Week 1): Pt will accept therapeutic trials of thin H2O and purees with minimal overt s/sx aspiration X3 prior to repeat MBSS. SLP Short Term Goal 2 (Week 1): Pt will sustain attention to functional tasks for 5 minute intervals with Mod A cues for redirection. SLP Short Term Goal 3 (Week 1): Pt will repeat at the word level with 50% accuracy provided Max A multimodal cues. SLP Short Term Goal 4 (Week 1): Pt will produce automatic speech sequences with 75% accuracy provided Max A multimodal cues. SLP Short Term Goal 5 (Week 1): Pt will demonstrate ability to use gestures and/or a communication board to communication basic wants and needs with Mod A multimodal cues.  Skilled Therapeutic Interventions:  Skilled therapeutic intervention focused on communication and dysphagia. Pt very restless this session and repositioning often to get comfortable. Some improvement with repositioning by RN tech. Decreased spontaneous productions of words and phrases today in responses to questions. He produced "no" "yes" and "you're welcome". Pt often said "no" or yea" during single repetition task. Yes/no responses to questions in context were 90% accurate. [name] and produced 3-4 jargon words. Oral care and trials with ice chips completed at end of session. Pt tolerated with no overt s/sx of aspiration or penetration. Cont with therapy per plan of care.      Pain Pain Assessment Pain Scale: Faces Faces Pain Scale: No hurt  Therapy/Group: Individual Therapy  [name] Skylah Delauter 09/24/2020, 11:55 AM

## 2020-09-24 NOTE — IPOC Note (Signed)
Overall Plan of Care [facility]) Patient Details Name: Jason Romero MRN: [id number] DOB: 1971/07/31  Admitting Diagnosis: ICH (intracerebral hemorrhage) [facility])  Hospital Problems: Principal Problem:   ICH (intracerebral hemorrhage) (HCC)     Functional Problem List: Nursing Behavior, Bladder, Bowel, Endurance, Medication Management, Motor, Safety, Sensory, Skin Integrity  PT Balance, Perception, Behavior, Safety, Edema, Sensory, Endurance, Skin Integrity, Motor, Nutrition, Pain  OT Balance, Behavior, Cognition, Endurance, Motor, Perception, Safety, Sensory, Vision  SLP Cognition, Linguistic, Nutrition, Safety  TR         Basic ADL's: OT Eating, Grooming, Bathing, Dressing, Toileting     Advanced  ADL's: OT       Transfers: PT Bed Mobility, Bed to Chair, Car, [employer], Floor  OT Toilet     Locomotion: PT Ambulation, Stairs, Wheelchair Mobility     Additional Impairments: OT Fuctional Use of Upper Extremity  SLP Swallowing, Communication, Social Cognition expression Attention, Awareness, Problem Solving  TR      Anticipated Outcomes Item Anticipated Outcome  Self Feeding Mod I  Swallowing  Min A   Basic self-care  Min A  Toileting  Min A   Bathroom Transfers Mod A  Bowel/Bladder  to be continent x 2  Transfers     Locomotion     Communication  Min A  Cognition  Supervision-Min  Pain  less than 3  Safety/Judgment  to remain fall free while in rehab   Therapy Plan: PT Intensity: Minimum of 1-2 x/day ,45 to 90 minutes PT Frequency: 5 out of 7 days PT Duration Estimated Length of Stay: 4 weeks OT Intensity: Minimum of 1-2 x/day, 45 to 90 minutes OT Frequency: 5 out of 7 days OT Duration/Estimated Length of Stay: 4 weeks SLP Intensity: Minumum of 1-2 x/day, 30 to 90 minutes SLP Frequency: 3 to 5 out of 7 days SLP Duration/Estimated Length of Stay: 3.5-4 weeks   Due to the current state of emergency, patients may not be receiving their 3-hours of  Medicare-mandated therapy.   Team Interventions: Nursing Interventions Patient/Family Education, Bladder Management, Bowel Management, Disease Management/Prevention, Medication Management, Skin Care/Wound Management, Cognitive Remediation/Compensation, Dysphagia/Aspiration Precaution Training, Discharge Planning, Psychosocial Support  PT interventions Balance/vestibular training, Ambulation/gait training, Cognitive remediation/compensation, Community reintegration, DME/adaptive equipment instruction, Disease management/prevention, Functional electrical stimulation, Discharge planning, Functional mobility training, Neuromuscular re-education, Psychosocial support, Patient/family education, Pain management, Skin care/wound management, Splinting/orthotics, Stair training, Therapeutic Exercise, Therapeutic Activities, UE/LE Strength taining/ROM, UE/LE Coordination activities, Visual/perceptual remediation/compensation, Wheelchair propulsion/positioning  OT Interventions Warden/ranger, Cognitive remediation/compensation, Community reintegration, Discharge planning, DME/adaptive equipment instruction, Functional electrical stimulation, Functional mobility training, Neuromuscular re-education, Pain management, Patient/family education, Psychosocial support, Self Care/advanced ADL retraining, Therapeutic Activities, Therapeutic Exercise, UE/LE Strength taining/ROM, UE/LE Coordination activities, Visual/perceptual remediation/compensation, Wheelchair propulsion/positioning  SLP Interventions Cognitive remediation/compensation, Cueing hierarchy, Dysphagia/aspiration precaution training, Functional tasks, Internal/external aids, Multimodal communication approach, Speech/Language facilitation, Therapeutic Activities, Patient/family education  TR Interventions    SW/CM Interventions Discharge Planning, Psychosocial Support, Patient/Family Education   Barriers to Discharge MD  Medical stability, Home  enviroment access/loayout, Incontinence, Neurogenic bowel and bladder, Lack of/limited family support, Weight, Weight bearing restrictions, Medication compliance, Behavior and Nutritional means  Nursing Incontinence, Nutrition means    PT Inaccessible home environment, Home environment access/layout, Incontinence, Medication compliance, Weight, Behavior    OT Inaccessible home environment, Decreased caregiver support, Incontinence, Insurance for SNF coverage    SLP      SW Nutrition means, Insurance for SNF coverage, Lack of/limited family support, Decreased caregiver support, Other (comments) Cortrak, UNINSURED  Team Discharge Planning: Destination: PT-Home ,OT- Home , SLP-Home Projected Follow-up: PT-Home health PT, OT-  Home health OT, 24 hour supervision/assistance, SLP-24 hour supervision/assistance, Home Health SLP Projected Equipment Needs: PT-Wheelchair (measurements), Wheelchair cushion (measurements), To be determined, OT- To be determined, SLP-None recommended by SLP Equipment Details: PT- , OT-  Patient/family involved in discharge planning: PT- Patient, Family member/caregiver,  OT-Patient, Family member/caregiver, SLP-Patient  MD ELOS: 4 weeks Medical Rehab Prognosis:  Fair Assessment: Pt is a 49 yr old male with 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   Goals are  Min Assist, hopefully    See Team Conference Notes for weekly updates to the plan of care

## 2020-09-24 NOTE — Progress Notes (Signed)
[place] PHYSICAL MEDICINE & REHABILITATION PROGRESS NOTE   Subjective/Complaints:  Pt denies pain- just incontinent of a large BM.  Still has Cortrak- is NPO.   PT asked for trying to timed void pt- will try every 2 hours during day, to see if will keep him dry.   ROS- unable to due to aphasia   Objective:   No results found. No results for input(s): WBC, HGB, HCT, PLT in the last 72 hours. No results for input(s): NA, K, CL, CO2, GLUCOSE, BUN, CREATININE, CALCIUM in the last 72 hours.  Intake/Output Summary (Last 24 hours) at 09/24/2020 1315 Last data filed at 09/24/2020 0900 Gross per 24 hour  Intake 2442.5 ml  Output 75 ml  Net 2367.5 ml        Physical Exam: Vital Signs Blood pressure 108/68, pulse 83, temperature 98.2 F (36.8 C), temperature source Oral, resp. rate 20, height 6' 1.5" (1.867 m), weight 104.1 kg, SpO2 100 %.  General: somewhat alert, trying to vocalize, but no verbalizations, NT in room, NAD HEENT: Head is normocephalic, +Crtrak Neck: Supple without JVD or lymphadenopathy Heart: RRR Chest: CTA B/L- no W/R/R- good air movement Abdomen: Soft, NT, ND, (+)BS - protuberant vs distended- just had BM Extremities: No clubbing, cyanosis, or edema. Pulses are 2+  Skin: No evidence of breakdown, no evidence of rash Neurologic: Cranial nerves II through XII intact, motor strength is 5/5 inleft , 0/5 RIght  deltoid, bicep, tricep, grip, hip flexor, knee extensors, ankle dorsiflexor and plantar flexor Sensory exam absent pinch RUE and RLE  Musculoskeletal: no pain with range of motion in all 4 extremities. No joint swelling   Assessment/Plan: 1. Functional deficits which require 3+ hours per day of interdisciplinary therapy in a comprehensive inpatient rehab setting.  Physiatrist is providing close team supervision and 24 hour management of active medical problems listed below.  Physiatrist and rehab team continue to assess barriers to  discharge/monitor patient progress toward functional and medical goals  Care Tool:  Bathing    Body parts bathed by patient: Right arm, Chest, Abdomen, Front perineal area, Left upper leg, Face   Body parts bathed by helper: Left arm, Buttocks, Right upper leg, Right lower leg, Left lower leg     Bathing assist Assist Level: Moderate Assistance - Patient 50 - 74%     Upper Body Dressing/Undressing Upper body dressing   What is the patient wearing?: Pull over shirt    Upper body assist Assist Level: Moderate Assistance - Patient 50 - 74%    Lower Body Dressing/Undressing Lower body dressing      What is the patient wearing?: Pants, Incontinence brief     Lower body assist Assist for lower body dressing: Maximal Assistance - Patient 25 - 49%     Toileting Toileting Toileting Activity did not occur (Clothing management and hygiene only):  (2 assit stedy)  Toileting assist Assist for toileting: Total Assistance - Patient < 25%     Transfers Chair/bed transfer  Transfers assist     Chair/bed transfer assist level: Total Assistance - Patient < 25%     Locomotion Ambulation   Ambulation assist   Ambulation activity did not occur: Safety/medical concerns          Walk 10 feet activity   Assist  Walk 10 feet activity did not occur: Safety/medical concerns        Walk 50 feet activity   Assist Walk 50 feet with 2 turns activity did not occur: Safety/medical  concerns         Walk 150 feet activity   Assist Walk 150 feet activity did not occur: Safety/medical concerns         Walk 10 feet on uneven surface  activity   Assist Walk 10 feet on uneven surfaces activity did not occur: Safety/medical concerns         Wheelchair     Assist Will patient use wheelchair at discharge?: Yes Type of Wheelchair: Manual    Wheelchair assist level: Dependent - Patient 0% Max wheelchair distance: 150    Wheelchair 50 feet with 2 turns  activity    Assist        Assist Level: Dependent - Patient 0%   Wheelchair 150 feet activity     Assist      Assist Level: Dependent - Patient 0%   Blood pressure 108/68, pulse 83, temperature 98.2 F (36.8 C), temperature source Oral, resp. rate 20, height 6' 1.5" (1.867 m), weight 104.1 kg, SpO2 100 %.    Medical Problem List and Plan: 1.  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  Antithrombotics: -DVT/anticoagulation: SCDs             -antiplatelet therapy: N/A 3. Pain Management: Tylenol as needed. Does not appear to be in pain.   11/13- pt denies pain by shaking head no- multiple times- con't tylenol prn 4. Mood: Provide emotional support             -antipsychotic agents: N/A 5. Neuropsych: This patient is not capable of making decisions on his own behalf. 6. Skin/Wound Care: Routine skin checks 7. Fluids/Electrolytes/Nutrition: Routine in and outs with follow-up chemistries 8.  Dysphagia.  NPO.  Alternative means of nutritional support.  Speech therapy follow-up, may need PEG if slow improvement  11/13- con't Cortrak- will check with SLP if see improvement to thin liquids by d/c- if not, PEG?  9.  Hypertension.  Norvasc 10 mg daily, hydralazine 100 mg every 8 hours, Normodyne 300 mg every 8 hours, lisinopril 20 mg twice daily.  Monitor with increased mobility Vitals:   09/23/20 2110 09/24/20 0429  BP: 119/86 108/68  Pulse: 80 83  Resp:  20  Temp:  98.2 F (36.8 C)  SpO2:  100%   11/13- BP slightly soft to controlled- con't regimen.  10.  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  Provide counseling 11.  Obesity.  BMI 30.67-->31.36-->31.02  Dietary follow-up  12. Incontinence of bowel and  bladder  11/13- will try timed voiding, if possible, q2 hours while awake, to see if can keep pt more dry.   LOS: 4 days A FACE TO FACE EVALUATION WAS PERFORMED  Jason Romero 09/24/2020, 1:15 PM

## 2020-09-24 NOTE — Progress Notes (Signed)
Occupational Therapy Session Note  Patient Details  Name: Jason Romero MRN: [id number] Date of Birth: 1971-07-20  Today's Date: 09/24/2020 OT Individual Time: 5638-7564 OT Individual Time Calculation (min): 58 min    Short Term Goals: Week 1:  OT Short Term Goal 1 (Week 1): Patient will maintain static sitting balance at EOB with Min A in prep for ADLs. OT Short Term Goal 2 (Week 1): Patient will complete 2/3 parts of UB dressing task with use of hemi techinque. OT Short Term Goal 3 (Week 1): Patient complete 1/3 parts of LB dressing task seated EOB. OT Short Term Goal 4 (Week 1): Patient will complete 2/3 grooming tasks seated at sink level.  Skilled Therapeutic Interventions/Progress Updates:    OT session focused on bed mobility, endurance, and sit<>stand. Upon arrival, pt had large BM in bed. Completed rolling L>R with min A across several attempts and fair+ skill to follow simple directions. Pt required max-total A to roll R>L. OT and tech provided total assist for hygiene then pt transitioned supine>sit with max A. Pt remained sitting EOB with min-mod A for static balance x4 minutes. Completed sit<>stand in stedy 2x with max A x2 for LB dressing. Pt remained in seated position in stedy x4 minutes as tech changed linens of bed. Pt noted to become irritated and attempt to transition to bed requiring total assist for safety. When prompted, pt completed sit>stand and sit>supine with max Ax2. Pt left in bed with all needs in reach, RUE positioned on pillows, and bed alarm donned.   Therapy Documentation Precautions:  Precautions Precautions: Fall Precaution Comments: R hemiplegia, R inattention Restrictions Weight Bearing Restrictions: No General:   Vital Signs:   Pain: Pain Assessment Pain Scale: Faces Faces Pain Scale: No hurt ADL: ADL Eating: NPO ([employer]) Grooming: Minimal assistance Where Assessed-Grooming: Bed level Upper Body Bathing: Moderate assistance, Maximal  cueing Where Assessed-Upper Body Bathing: Edge of bed Lower Body Bathing: Moderate assistance Where Assessed-Lower Body Bathing: Bed level Upper Body Dressing: Moderate assistance Where Assessed-Upper Body Dressing: Edge of bed Lower Body Dressing: Maximal assistance, Maximal cueing Where Assessed-Lower Body Dressing: Bed level Toileting: Dependent (Incontinent x2) Where Assessed-Toileting: Bed level Toilet Transfer: Not assessed Tub/Shower Transfer: Not assessed Vision   Perception    Praxis   Exercises:   Other Treatments:     Therapy/Group: Individual Therapy  Daneil Dan 09/24/2020, 2:48 PM

## 2020-09-25 LAB — GLUCOSE, CAPILLARY
Glucose-Capillary: 125 mg/dL — ABNORMAL HIGH (ref 70–99)
Glucose-Capillary: 129 mg/dL — ABNORMAL HIGH (ref 70–99)
Glucose-Capillary: 163 mg/dL — ABNORMAL HIGH (ref 70–99)

## 2020-09-25 NOTE — Progress Notes (Signed)
[facility] PHYSICAL MEDICINE & REHABILITATION PROGRESS NOTE   Subjective/Complaints:  Tried timed voiding per nursing- however is always wet, no matter how frequently they came in.   Still having bowel incontinence Has a black/brownish spot at top /roof of mouth that's painful per nurse.    ROS- unable to assess due to aphasia   Objective:   No results found. No results for input(s): WBC, HGB, HCT, PLT in the last 72 hours. No results for input(s): NA, K, CL, CO2, GLUCOSE, BUN, CREATININE, CALCIUM in the last 72 hours.  Intake/Output Summary (Last 24 hours) at 09/25/2020 1428 Last data filed at 09/25/2020 0518 Gross per 24 hour  Intake 3350 ml  Output --  Net 3350 ml        Physical Exam: Vital Signs Blood pressure (!) 149/83, pulse 86, temperature (!) 97.5 F (36.4 C), resp. rate 18, height 6' 1.5" (1.867 m), weight 104.7 kg, SpO2 98 %.  General: awake, tried to vocalize, sad affect, being changed from BM accident, NAD HEENT: Head is normocephalic, +Cortrak still place- has dark brownish spot at roof of mouth- painful to tongue touch- tumor? Very TTP- almost looked like partial plate, but family/pt say he doesn't have one Neck: Supple without JVD or lymphadenopathy Heart: RRR Chest: CTA B/L- no W/R/R- good air movement Abdomen: Soft, NT, ND, (+)BS  - just had BM Extremities: No clubbing, cyanosis, or edema. Pulses are 2+  Skin: No evidence of breakdown, no evidence of rash Neurologic: Cranial nerves II through XII intact, motor strength is 5/5 inleft , 0/5 RIght  deltoid, bicep, tricep, grip, hip flexor, knee extensors, ankle dorsiflexor and plantar flexor Sensory exam absent pinch RUE and RLE  Musculoskeletal: no pain with range of motion in all 4 extremities. No joint swelling   Assessment/Plan: 1. Functional deficits which require 3+ hours per day of interdisciplinary therapy in a comprehensive inpatient rehab setting.  Physiatrist is providing close team  supervision and 24 hour management of active medical problems listed below.  Physiatrist and rehab team continue to assess barriers to discharge/monitor patient progress toward functional and medical goals  Care Tool:  Bathing    Body parts bathed by patient: Right arm, Chest, Abdomen, Front perineal area, Left upper leg, Face   Body parts bathed by helper: Left arm, Buttocks, Right upper leg, Right lower leg, Left lower leg     Bathing assist Assist Level: Moderate Assistance - Patient 50 - 74%     Upper Body Dressing/Undressing Upper body dressing   What is the patient wearing?: Pull over shirt    Upper body assist Assist Level: Moderate Assistance - Patient 50 - 74%    Lower Body Dressing/Undressing Lower body dressing      What is the patient wearing?: Pants     Lower body assist Assist for lower body dressing: 2 Helpers     Toileting Toileting Toileting Activity did not occur (Clothing management and hygiene only):  (2 assit stedy)  Toileting assist Assist for toileting: Total Assistance - Patient < 25%     Transfers Chair/bed transfer  Transfers assist     Chair/bed transfer assist level: Total Assistance - Patient < 25%     Locomotion Ambulation   Ambulation assist   Ambulation activity did not occur: Safety/medical concerns          Walk 10 feet activity   Assist  Walk 10 feet activity did not occur: Safety/medical concerns        Walk 50  feet activity   Assist Walk 50 feet with 2 turns activity did not occur: Safety/medical concerns         Walk 150 feet activity   Assist Walk 150 feet activity did not occur: Safety/medical concerns         Walk 10 feet on uneven surface  activity   Assist Walk 10 feet on uneven surfaces activity did not occur: Safety/medical concerns         Wheelchair     Assist Will patient use wheelchair at discharge?: Yes Type of Wheelchair: Manual    Wheelchair assist level:  Dependent - Patient 0% Max wheelchair distance: 150    Wheelchair 50 feet with 2 turns activity    Assist        Assist Level: Dependent - Patient 0%   Wheelchair 150 feet activity     Assist      Assist Level: Dependent - Patient 0%   Blood pressure (!) 149/83, pulse 86, temperature (!) 97.5 F (36.4 C), resp. rate 18, height 6' 1.5" (1.867 m), weight 104.7 kg, SpO2 98 %.    Medical Problem List and Plan: 1.  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  Antithrombotics: -DVT/anticoagulation: SCDs             -antiplatelet therapy: N/A 3. Pain Management: Tylenol as needed. Does not appear to be in pain.   11/13- pt denies pain by shaking head no- multiple times- con't tylenol prn 4. Mood: Provide emotional support             -antipsychotic agents: N/A 5. Neuropsych: This patient is not capable of making decisions on his own behalf. 6. Skin/Wound Care: Routine skin checks 7. Fluids/Electrolytes/Nutrition: Routine in and outs with follow-up chemistries 8.  Dysphagia.  NPO.  Alternative means of nutritional support.  Speech therapy follow-up, may need PEG if slow improvement  11/13- con't Cortrak- will check with SLP if see improvement to thin liquids by d/c- if not, PEG?   11/14- Might need PEG?- will be assessed over next week or so 9.  Hypertension.  Norvasc 10 mg daily, hydralazine 100 mg every 8 hours, Normodyne 300 mg every 8 hours, lisinopril 20 mg twice daily.  Monitor with increased mobility Vitals:   09/25/20 0413 09/25/20 1352  BP: 133/87 (!) 149/83  Pulse: 74 86  Resp: 20 18  Temp: 97.7 F (36.5 C) (!) 97.5 F (36.4 C)  SpO2: 100% 98%   11/14- BP controlled to slightly high- con't regimen- since soft BP yesterday.  10.  History of tobacco abuse as well  as marijuana use.Also amphetamine use  Urine drug screen positive marijuana.  Provide counseling 11.  Obesity.  BMI 30.67-->31.36-->31.02  Dietary follow-up  12. Incontinence of bowel and bladder  11/13- will try timed voiding, if possible, q2 hours while awake, to see if can keep pt more dry.  13. Roof of mouth discoloration  11/14- very painful- concerning- looks like partial plate but family says doesn't have one- pt cannot tell us- but I'm concerned could have tumor in roof of mouth?    LOS: 5 days A FACE TO FACE EVALUATION WAS PERFORMED  Kynzi Levay 09/25/2020, 2:28  PM

## 2020-09-25 NOTE — Progress Notes (Signed)
Pt has an area to roof of mouth that discolored and painful.

## 2020-09-26 ENCOUNTER — Inpatient Hospital Stay (HOSPITAL_COMMUNITY): Payer: Self-pay | Admitting: Speech Pathology

## 2020-09-26 ENCOUNTER — Inpatient Hospital Stay (HOSPITAL_COMMUNITY): Payer: Self-pay | Admitting: Physical Therapy

## 2020-09-26 ENCOUNTER — Inpatient Hospital Stay (HOSPITAL_COMMUNITY): Payer: Self-pay | Admitting: Occupational Therapy

## 2020-09-26 ENCOUNTER — Other Ambulatory Visit: Payer: Self-pay

## 2020-09-26 LAB — GLUCOSE, CAPILLARY
Glucose-Capillary: 105 mg/dL — ABNORMAL HIGH (ref 70–99)
Glucose-Capillary: 129 mg/dL — ABNORMAL HIGH (ref 70–99)
Glucose-Capillary: 136 mg/dL — ABNORMAL HIGH (ref 70–99)

## 2020-09-26 MED ORDER — HYDRALAZINE HCL 50 MG PO TABS
50.0000 mg | ORAL_TABLET | Freq: Three times a day (TID) | ORAL | Status: DC
  Administered 2020-09-26 – 2020-09-29 (×8): 50 mg
  Filled 2020-09-26 (×9): qty 1

## 2020-09-26 NOTE — Progress Notes (Signed)
[facility] PHYSICAL MEDICINE & REHABILITATION PROGRESS NOTE   Subjective/Complaints:  No issues overnite, pt remains NPO with cortrak    ROS- unable to assess due to aphasia   Objective:   No results found. No results for input(s): WBC, HGB, HCT, PLT in the last 72 hours. No results for input(s): NA, K, CL, CO2, GLUCOSE, BUN, CREATININE, CALCIUM in the last 72 hours.  Intake/Output Summary (Last 24 hours) at 09/26/2020 0849 Last data filed at 09/25/2020 1500 Gross per 24 hour  Intake 600 ml  Output --  Net 600 ml        Physical Exam: Vital Signs Blood pressure 110/74, pulse 77, temperature 97.6 F (36.4 C), temperature source Oral, resp. rate 20, height 6' 1.5" (1.867 m), weight 105.7 kg, SpO2 100 %.  Oral- dried mucus plaque on roof of mouth  General: No acute distress Mood and affect are appropriate Heart: Regular rate and rhythm no rubs murmurs or extra sounds Lungs: Clear to auscultation, breathing unlabored, no rales or wheezes Abdomen: Positive bowel sounds, soft nontender to palpation, nondistended Extremities: No clubbing, cyanosis, or edema   Skin: No evidence of breakdown, no evidence of rash Neurologic: Cranial nerves II through XII intact, motor strength is 5/5 inleft , 0/5 RIght  deltoid, bicep, tricep, grip, hip flexor, knee extensors, ankle dorsiflexor and plantar flexor Sensory exam absent pinch RUE and RLE  Musculoskeletal: no pain with range of motion in all 4 extremities. No joint swelling   Assessment/Plan: 1. Functional deficits which require 3+ hours per day of interdisciplinary therapy in a comprehensive inpatient rehab setting.  Physiatrist is providing close team supervision and 24 hour management of active medical problems listed below.  Physiatrist and rehab team continue to assess barriers to discharge/monitor patient progress toward functional and medical goals  Care Tool:  Bathing    Body parts bathed by patient: Right arm,  Chest, Abdomen, Front perineal area, Left upper leg, Face   Body parts bathed by helper: Left arm, Buttocks, Right upper leg, Right lower leg, Left lower leg     Bathing assist Assist Level: Moderate Assistance - Patient 50 - 74%     Upper Body Dressing/Undressing Upper body dressing   What is the patient wearing?: Pull over shirt    Upper body assist Assist Level: Moderate Assistance - Patient 50 - 74%    Lower Body Dressing/Undressing Lower body dressing      What is the patient wearing?: Pants     Lower body assist Assist for lower body dressing: 2 Helpers     Toileting Toileting Toileting Activity did not occur (Clothing management and hygiene only):  (2 assit stedy)  Toileting assist Assist for toileting: Total Assistance - Patient < 25%     Transfers Chair/bed transfer  Transfers assist     Chair/bed transfer assist level: Total Assistance - Patient < 25%     Locomotion Ambulation   Ambulation assist   Ambulation activity did not occur: Safety/medical concerns          Walk 10 feet activity   Assist  Walk 10 feet activity did not occur: Safety/medical concerns        Walk 50 feet activity   Assist Walk 50 feet with 2 turns activity did not occur: Safety/medical concerns         Walk 150 feet activity   Assist Walk 150 feet activity did not occur: Safety/medical concerns         Walk 10 feet on  uneven surface  activity   Assist Walk 10 feet on uneven surfaces activity did not occur: Safety/medical concerns         Wheelchair     Assist Will patient use wheelchair at discharge?: Yes Type of Wheelchair: Manual    Wheelchair assist level: Dependent - Patient 0% Max wheelchair distance: 150    Wheelchair 50 feet with 2 turns activity    Assist        Assist Level: Dependent - Patient 0%   Wheelchair 150 feet activity     Assist      Assist Level: Dependent - Patient 0%   Blood pressure 110/74,  pulse 77, temperature 97.6 F (36.4 C), temperature source Oral, resp. rate 20, height 6' 1.5" (1.867 m), weight 105.7 kg, SpO2 100 %.    Medical Problem List and Plan: 1.  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  Antithrombotics: -DVT/anticoagulation: SCDs             -antiplatelet therapy: N/A 3. Pain Management: Tylenol as needed. Does not appear to be in pain.   11/13- pt denies pain by shaking head no- multiple times- con't tylenol prn 4. Mood: Provide emotional support             -antipsychotic agents: N/A 5. Neuropsych: This patient is not capable of making decisions on his own behalf. 6. Skin/Wound Care: Routine skin checks 7. Fluids/Electrolytes/Nutrition: Routine in and outs with follow-up chemistries 8.  Dysphagia.  NPO.  Alternative means of nutritional support.  Speech therapy follow-up, may need PEG if slow improvement  11/13- con't Cortrak- will check with SLP if see improvement to thin liquids by d/c- if not, PEG?    9.  Hypertension.  Norvasc 10 mg daily, hydralazine 100 mg every 8 hours, Normodyne 300 mg every 8 hours, lisinopril 20 mg twice daily.  Monitor with increased mobility Vitals:   09/26/20 0531 09/26/20 0556  BP: 110/74 110/74  Pulse:  77  Resp:    Temp:    SpO2:     Controlled , running low normal check orthostatics, can back off hydralazine  10.  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  Provide counseling 11.  Obesity.  BMI 30.67-->31.36-->31.02  Dietary follow-up  12. Incontinence of bowel and bladder  11/13- will try timed voiding, if possible, q2 hours while awake, to see if can keep pt more dry.  13. Roof of mouth discoloration  Will ask for SLP to perform oral  care    LOS: 6 days A FACE TO FACE EVALUATION WAS PERFORMED  Erick Colace 09/26/2020, 8:49 AM

## 2020-09-26 NOTE — Progress Notes (Signed)
Pt refused R hand splint. Pt did allow RUE to be elevated with pillows.

## 2020-09-26 NOTE — Progress Notes (Signed)
Speech Language Pathology Daily Session Note  Patient Details  Name: Jason Romero MRN: [id number] Date of Birth: 02-04-1971  Today's Date: 09/26/2020 SLP Individual Time: 7793-9030 SLP Individual Time Calculation (min): 57 min  Short Term Goals: Week 1: SLP Short Term Goal 1 (Week 1): Pt will accept therapeutic trials of thin H2O and purees with minimal overt s/sx aspiration X3 prior to repeat MBSS. SLP Short Term Goal 2 (Week 1): Pt will sustain attention to functional tasks for 5 minute intervals with Mod A cues for redirection. SLP Short Term Goal 3 (Week 1): Pt will repeat at the word level with 50% accuracy provided Max A multimodal cues. SLP Short Term Goal 4 (Week 1): Pt will produce automatic speech sequences with 75% accuracy provided Max A multimodal cues. SLP Short Term Goal 5 (Week 1): Pt will demonstrate ability to use gestures and/or a communication board to communication basic wants and needs with Mod A multimodal cues.  Skilled Therapeutic Interventions: Pt was seen for skilled ST targeting dysphagia and communication goals. Pt's verbal output was limited to a few single word utterances today - mostly "yes" "no" "yeah" and "alright". Poor frustration tolerance noted during attempts to work on verbalizing high frequency and personally relevant words. When SLP attempted to provide phonemic cues for assistance with word level verbalizations, he repeated said "no no no no no no no" and waved his hands to indicate he was done and didn't want to continue. Task switched to multimodal communication efficiency. SLP created a basic picture and word communication board, which pt required Moderate verbal and visual cues to demonstrate its use with ~60% accuracy. Left board at bedside and encouraged pt to use throughout day.  After thorough oral care via suction, pt also accepted trials of thin H2O and puree to prepare for repeat MBSS. He demonstrated right anterior loss of liquid and puree  boluses, although good oral clearance. Immedate cough noted in 3/10 ice chips, 2 coughs in ~2oz thin H2O self fed cup sips. No overt s/sx aspiration noted with puree. Recommend pt continue NPO today (excepet a FEW small ice chips during oral care with RN or SLP ONLY), and repeat MBSS tomorrow 09/27/20 to assess potential for initiation of PO diet. Discussed this plan with pt and potential for diet initiation but likely need for modified textures, depending on results of MBSS. He indicated agreement with head nods and verbalized "good". Pt lfet laying in bed with alarm set and needs within reach. Continue per current plan of care.           Pain Pain Assessment Pain Scale: Faces Faces Pain Scale: No hurt  Therapy/Group: Individual Therapy  Jason Romero 09/26/2020, 12:12 PM

## 2020-09-26 NOTE — Progress Notes (Signed)
Physical Therapy Session Note  Patient Details  Name: Jason Romero MRN: [id number] Date of Birth: Oct 02, 1971  Today's Date: 09/26/2020 PT Individual Time: 1100-1200 PT Individual Time Calculation (min): 60 min   Short Term Goals: Week 1:  PT Short Term Goal 1 (Week 1): Pt will perfom bed mobility with mod assist and use of bed features PT Short Term Goal 2 (Week 1): Pt will propell WC 113f with min assist PT Short Term Goal 3 (Week 1): Pt will tolerate sitting up in WC 2 hours between therapies PT Short Term Goal 4 (Week 1): Pt will perform sitting balance EOB with mod assist from PT up to 5 minutes  Skilled Therapeutic Interventions/Progress Updates: Pt presented in bed agreeable to therapy. Pt denies pain during session. Pt noted to be significantly soiled with urine. PTA performed peri-care with pt rolling to R with minA and to L with maxA. PTA changed brief and threaded pants with pt performing bridges to allow PTA to complete pulling pants over hips. Pt then performed supine to sit with maxA and [place]. Pt performed stand in Stedy with modA x 2 and noted heavy R lean. Once in TIS pt transported to rehab gym and performed squat pivot transfer to mat. Pt required max multimodal cues for scooting to EOC and pt required max multimodal cues for sequencing due to impulsivity. Pt initally with L lean and was able to correct with mirror feedback and maintain supervision level of static balance. PTA then attempted to engage pt for sitting balance activities by placing cards on board. Pt placed 6/9 cards on board but refusing to place last three. Pt was able to return to midline with moderate reaching challenges. Pt to noted to have poor control of velocity when reaching towards board on first 3 cards but improved on latter 3. Pt then attempting to express something but becoming frustrated. Pt nodded head yes when asked if needed to rest. PTA provided wedge behind pt and  pt was able to lay on wedge for 2-336m. Pt agreeable to participate in one bout of horseshoes with pt demonstrating improved balance and able to return to midline with reaching activities. Pt was then returned to TIS via squat pivot x 2 in same manner as prior. Pt agreeable to remain in TIS after therapies. Pt returned to room and remained in TIS with belt alarm on, call bell within reach and current needs met with nsg notified of pt's disposition.      Therapy Documentation Precautions:  Precautions Precautions: Fall Precaution Comments: R hemiplegia, R inattention Restrictions Weight Bearing Restrictions: No General:   Vital Signs: Therapy Vitals Temp: 98.2 F (36.8 C) Pulse Rate: (!) 103 Resp: 18 BP: 128/87 Patient Position (if appropriate): Sitting Oxygen Therapy SpO2: 98 % O2 Device: Room Air Pain: Pain Assessment Pain Scale: Faces Faces Pain Scale: Hurts little more Pain Location: Head Pain Orientation: Left   Therapy/Group: Individual Therapy  Felisa Zechman  Vella Colquitt, PTA  09/26/2020, 4:19 PM

## 2020-09-26 NOTE — Progress Notes (Signed)
Occupational Therapy Session Note  Patient Details  Name: Jason Romero MRN: [id number] Date of Birth: December 09, 1970  Today's Date: 09/26/2020 OT Individual Time: 6222-9798 OT Individual Time Calculation (min): 72 min   Short Term Goals: Week 1:  OT Short Term Goal 1 (Week 1): Patient will maintain static sitting balance at EOB with Min A in prep for ADLs. OT Short Term Goal 2 (Week 1): Patient will complete 2/3 parts of UB dressing task with use of hemi techinque. OT Short Term Goal 3 (Week 1): Patient complete 1/3 parts of LB dressing task seated EOB. OT Short Term Goal 4 (Week 1): Patient will complete 2/3 grooming tasks seated at sink level.  Skilled Therapeutic Interventions/Progress Updates:    Pt greeted in bed, appearing squirmy. Per spouse [name], pt was sitting up in the w/c for a while and just got back into bed. Pt refused EOB or OOB tx, agreeable to bedlevel therapy. Started with providing spouse with a paper printout regarding Rt UE PROM exercises. OT demonstrated each exercise and then [name] practiced herself. Had her cue pt to visually attend to his affected UE and count aloud while stretches were maintained (to work on Rt attention). Note that pt did exhibit some active horizontal adduction abilities but otherwise Rt UE noted to be flaccid. Worked on Rt sided weightbearing and trunk strengthening via supine bridges, pt maintaining buttocks clearance for ~10 seconds x10 reps with OT stabilizing the Rt LE. Transitioned to anterior/lateral reaching outside of base of support and across midline for trunk strengthening as well. Pt required multiple rest breaks, was visibly fatigued near end of session. Education provided to spouse on importance of sidelying positioning strategies for skin integrity purposes. Positioned pt in sidelying using props with hemi strategies utilized. At end of session pt remained comfortably in bed with all needs within reach and bed alarm set.   Pt  declined participation in bathing/dressing or toileting tasks during session  Therapy Documentation Precautions:  Precautions Precautions: Fall Precaution Comments: R hemiplegia, R inattention Restrictions Weight Bearing Restrictions: No Vital Signs: Therapy Vitals Temp: 98.2 F (36.8 C) Pulse Rate: (!) 103 Resp: 18 BP: 128/87 Patient Position (if appropriate): Sitting Oxygen Therapy SpO2: 98 % O2 Device: Room Air Pain: Pain Assessment Pain Scale: Faces Faces Pain Scale: Hurts little more Pain Location: Head Pain Orientation: Left ADL: ADL Eating: NPO ([employer]) Grooming: Minimal assistance Where Assessed-Grooming: Bed level Upper Body Bathing: Moderate assistance, Maximal cueing Where Assessed-Upper Body Bathing: Edge of bed Lower Body Bathing: Moderate assistance Where Assessed-Lower Body Bathing: Bed level Upper Body Dressing: Moderate assistance Where Assessed-Upper Body Dressing: Edge of bed Lower Body Dressing: Maximal assistance, Maximal cueing Where Assessed-Lower Body Dressing: Bed level Toileting: Dependent (Incontinent x2) Where Assessed-Toileting: Bed level Toilet Transfer: Not assessed Tub/Shower Transfer: Not assessed      Therapy/Group: Individual Therapy  David Towson A Ezra Marquess 09/26/2020, 4:11 PM

## 2020-09-27 ENCOUNTER — Inpatient Hospital Stay (HOSPITAL_COMMUNITY): Payer: Self-pay

## 2020-09-27 ENCOUNTER — Inpatient Hospital Stay (HOSPITAL_COMMUNITY): Payer: Self-pay | Admitting: Physical Therapy

## 2020-09-27 ENCOUNTER — Inpatient Hospital Stay (HOSPITAL_COMMUNITY): Payer: Self-pay | Admitting: Occupational Therapy

## 2020-09-27 LAB — GLUCOSE, CAPILLARY
Glucose-Capillary: 109 mg/dL — ABNORMAL HIGH (ref 70–99)
Glucose-Capillary: 117 mg/dL — ABNORMAL HIGH (ref 70–99)
Glucose-Capillary: 125 mg/dL — ABNORMAL HIGH (ref 70–99)
Glucose-Capillary: 127 mg/dL — ABNORMAL HIGH (ref 70–99)

## 2020-09-27 NOTE — Progress Notes (Signed)
Physical Therapy Session Note  Patient Details  Name: Jason Romero MRN: [id number] Date of Birth: 03/07/1971  Today's Date: 09/27/2020 PT Individual Time: 1000-1110 PT Individual Time Calculation (min): 70 min   Short Term Goals: Week 1:  PT Short Term Goal 1 (Week 1): Pt will perfom bed mobility with mod assist and use of bed features PT Short Term Goal 2 (Week 1): Pt will propell WC 17f with min assist PT Short Term Goal 3 (Week 1): Pt will tolerate sitting up in WC 2 hours between therapies PT Short Term Goal 4 (Week 1): Pt will perform sitting balance EOB with mod assist from PT up to 5 minutes  Skilled Therapeutic Interventions/Progress Updates: Pt presented in TIS sleeping but easily aroused and agreeable to therapy. Pt nods head no when asked about pain. Pt nods head yes when asked if agreeable to try standing this session. Pt transported to rehab gym and participated in STS in parallel bars. Pt required modA with +2 present for safety for STS. Pt required manual facilitation for anterior translation of hips to improve erect posture. PTA facilitated lateral wt shifting with second therapist blocking R knee. Pt was able to tolerate standing in a parallel bars for 3 bouts with times varying between 30 sec and 1 min with seated rests. Pt then participated in gait training with second therapist advancing RLE and PTA providing lateral wt shfits at hips. Pt was able to take x 4 steps forward then after seated rest take 4 steps forward/backwards. Pt then harnessed to Lite Gait with pt able to perform partial stand with modA x 1 to pull harness through legs. Pt participated in Lite Gait ground supported x 162fwith maxA for RLE advancement and max multimodal cues. Pt notably fatigued after activity and transported back to room. Pt nodding head yes when asked if needed to use bathroom. Performed stand pivot transfer to toilet maxA x 2 with modA for stand. Total A for LB clothing management. Once  sitting on toilet pt was able to have continent void. Pt when performed stand pivot back to TIS maxA x 2 due to impulsiveness. Pt then set up and performed squat pivot transfer to L with modA and improved control, maxA sit to supine for BLE management. Pt then performed LTR for NMR forced use of RLE with pt able to control RLE at neutral and into adduction. Pt also attempted bridges however notably fatigued and falling asleep. Pt repositioned to comfort and left with bed alarm on, call bell within reach and needs met.      Therapy Documentation Precautions:  Precautions Precautions: Fall Precaution Comments: R hemiplegia, R inattention Restrictions Weight Bearing Restrictions: No General:   Vital Signs: Therapy Vitals Temp: 98.2 F (36.8 C) Pulse Rate: 84 BP: 124/89 Oxygen Therapy SpO2: 98 % Pain: Pain Assessment Pain Score: 0-No pain   Therapy/Group: Individual Therapy  Clotilda Hafer  Freeland Pracht, PTA  09/27/2020, 4:10 PM

## 2020-09-27 NOTE — Progress Notes (Signed)
Physical Therapy Session Note  Patient Details  Name: Jason Romero MRN: [id number] Date of Birth: 04/22/1971  Today's Date: 09/27/2020 PT Individual Time: 1345-1414 PT Individual Time Calculation (min): 29 min   Short Term Goals: Week 1:  PT Short Term Goal 1 (Week 1): Pt will perfom bed mobility with mod assist and use of bed features PT Short Term Goal 2 (Week 1): Pt will propell WC 147ft with min assist PT Short Term Goal 3 (Week 1): Pt will tolerate sitting up in WC 2 hours between therapies PT Short Term Goal 4 (Week 1): Pt will perform sitting balance EOB with mod assist from PT up to 5 minutes  Skilled Therapeutic Interventions/Progress Updates:    pt received in bed sleeping soundly and required multiple attempts to awaken then agreeable to therapy. Pt directed in supine>sit max A with right upper extremity and lower extremity, total A for positioning at EOB. Once in sitting pt benefited from mod A for static sitting initial balance and improved head positioning. Pt directed in lateral trunk leans to right and left x8 each with manual assist to achieve and manual assist to return to midline from R side, to improve sitting balance, trunk and core strength and joint proximatation at R UE. Pt directed in forward reaching with left upper extremity to assist x5. Pt directed into sit>supine max A for Rt side management. Pt left in supine, All needs in reach and in good condition. Call light in hand.  And nursing present.   Therapy Documentation Precautions:  Precautions Precautions: Fall Precaution Comments: R hemiplegia, R inattention Restrictions Weight Bearing Restrictions: No General:   Vital Signs: Therapy Vitals Temp: 98.2 F (36.8 C) Pulse Rate: 84 BP: 124/89 Oxygen Therapy SpO2: 98 %    Therapy/Group: Individual Therapy  Barbaraann Faster 09/27/2020, 2:33 PM

## 2020-09-27 NOTE — Progress Notes (Signed)
Speech Language Pathology Daily Session Note  Patient Details  Name: Jason Romero MRN: [id number] Date of Birth: 27-Feb-1971  Today's Date: 09/27/2020 SLP Individual Time: 1417-1500 SLP Individual Time Calculation (min): 43 min  Short Term Goals: Week 1: SLP Short Term Goal 1 (Week 1): Pt will accept therapeutic trials of thin H2O and purees with minimal overt s/sx aspiration X3 prior to repeat MBSS. SLP Short Term Goal 2 (Week 1): Pt will sustain attention to functional tasks for 5 minute intervals with Mod A cues for redirection. SLP Short Term Goal 3 (Week 1): Pt will repeat at the word level with 50% accuracy provided Max A multimodal cues. SLP Short Term Goal 4 (Week 1): Pt will produce automatic speech sequences with 75% accuracy provided Max A multimodal cues. SLP Short Term Goal 5 (Week 1): Pt will demonstrate ability to use gestures and/or a communication board to communication basic wants and needs with Mod A multimodal cues.  Skilled Therapeutic Interventions:Skilled ST services focused on swallow and language skills. SLP facilitated thin liquid trials, providing set up assist for oral care with suction toothbrush. Pt consumed x15 ice chips demonstrating oral control, swallow appeared timely and no overt s/s aspiration. Pt consumed x5 TSP pf thin liquid in which immediate cough was noted on 2 trials and x1 cup sip of thin liquid also with immediate cough. SLP provided education pertaining to planned MBS tomorrow and possibilities of various liquid textures. SLP also facilitated expressive naming at word level with familiar cards in room, pt was able to name/verbalize 3 out 8 with max A sentence completion cues. Pt was unable to name words with initial letter /b/ when presented with pictures. Pt was able to demonstrate inconsistent verbalizations in various automatic naming task (counting 1-5, 1-10, days of the week.) Pt demonstrated ability to identify requested item on communication  board in 8 out 10 opportunities and express wants/needs in response to yes/no questions with 85% accuracy. Pt did produced x1 spontaneous phrase following first TSP of thin liquid "oh I love that."  Pt was left in room with call bell within reach and bed alarm set. SLP recommends to continue skilled services.     Pain Pain Assessment Pain Score: 0-No pain  Therapy/Group: Individual Therapy  Jason Romero  [facility] 09/27/2020, 3:44 PM

## 2020-09-27 NOTE — Progress Notes (Signed)
Occupational Therapy Session Note  Patient Details  Name: Jason Romero MRN: [id number] Date of Birth: 31-Jan-1971  Today's Date: 09/27/2020 OT Individual Time: 7614-7092 OT Individual Time Calculation (min): 59 min    Short Term Goals: Week 1:  OT Short Term Goal 1 (Week 1): Patient will maintain static sitting balance at EOB with Min A in prep for ADLs. OT Short Term Goal 2 (Week 1): Patient will complete 2/3 parts of UB dressing task with use of hemi techinque. OT Short Term Goal 3 (Week 1): Patient complete 1/3 parts of LB dressing task seated EOB. OT Short Term Goal 4 (Week 1): Patient will complete 2/3 grooming tasks seated at sink level.  Skilled Therapeutic Interventions/Progress Updates:  Patient met lying supine in bed in agreement with OT treatment session. 2/10 pain on [employer] FACES scale with movement of RUE likely 2/2 1 breath shoulder subluxation. Repositioning and care taken during transitional movements for pain management. Kinesio tape application at time of next treatment session for pain management. NG tube placed on hold and disconnected for ease with bathing/dressing tasks. Patient found to be incontinent of bowel/bladder requiring Total A for hygiene/clothing management. LB dressing at bed level with patient able to wash front perineal area, R and L upper thigh and L lower leg/foot. Assist to wash buttocks and R lower leg/foot. +2 assist to thread BLE through pants and hike over hips with assist to stabilize RLE while patient attempted to bridge in supine. Supine to EOB with assist to advance RLE to EOB and elevate trunk. Patient initially requiring Mod A progressing to Min guard for static sitting balance at EOB. Patient with continued impulsivity requiring frequent cueing for hand placement on bed surface to prevent LOB. Patient able to don L shoe with use of figure-4 position and assist to maintain sitting balance. Assist to attain/maintain figure-4 position to don R  shoe. Min A +2 for safety for squat-pivot transfer to wc on R with cues for hand placement and orientation to midline. At sink, patient able to wash UB with Mod A and don shirt with assist to thread RUE and LUE and pull down over abdomen. Oral hygiene with use of suction toothbrush with Min A for thoroughness. Session concluded with patient seated in TIS wc with belt alarm activated, call bell within reach, and all needs met.   Therapy Documentation Precautions:  Precautions Precautions: Fall Precaution Comments: R hemiplegia, R inattention Restrictions Weight Bearing Restrictions: No General:    Therapy/Group: Individual Therapy  Bradley Bostelman R Howerton-Davis 09/27/2020, 7:23 AM

## 2020-09-27 NOTE — Progress Notes (Signed)
[facility] PHYSICAL MEDICINE & REHABILITATION PROGRESS NOTE   Subjective/Complaints:   Remains aphasic.  Scheduled for MBS today.  The plaque on the roof of the mouth reportedly came out during physical therapy yesterday.  ROS- unable to assess due to aphasia   Objective:   No results found. No results for input(s): WBC, HGB, HCT, PLT in the last 72 hours. No results for input(s): NA, K, CL, CO2, GLUCOSE, BUN, CREATININE, CALCIUM in the last 72 hours.  Intake/Output Summary (Last 24 hours) at 09/27/2020 0823 Last data filed at 09/26/2020 1826 Gross per 24 hour  Intake 0 ml  Output --  Net 0 ml        Physical Exam: Vital Signs Blood pressure 113/78, pulse 86, temperature 98.4 F (36.9 C), temperature source Oral, resp. rate 18, height 6' 1.5" (1.867 m), weight 106.8 kg, SpO2 97 %.  Oral cavity has no dried mucus on the palate.  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 General: No acute distress Mood and affect are appropriate Heart: Regular rate and rhythm no rubs murmurs or extra sounds Lungs: Clear to auscultation, breathing unlabored, no rales or wheezes Abdomen: Positive bowel sounds, soft nontender to palpation, nondistended Extremities: No clubbing, cyanosis, or edema Morning Skin: No evidence of breakdown, no evidence of rash Neurologic: Cranial nerves II through XII intact, motor strength is 5/5 inleft , 0/5 RIght  deltoid, bicep, tricep, grip, hip flexor, knee extensors, ankle dorsiflexor and plantar flexor Sensory exam absent pinch RUE and RLE  Musculoskeletal: no pain with range of motion in all 4 extremities. No joint swelling   Assessment/Plan: 1. Functional deficits which require 3+ hours per day of interdisciplinary therapy in a comprehensive inpatient rehab setting.  Physiatrist is providing close team supervision and 24 hour management of active medical problems listed below.  Physiatrist and rehab team continue to assess barriers to  discharge/monitor patient progress toward functional and medical goals  Care Tool:  Bathing    Body parts bathed by patient: Right arm, Chest, Abdomen, Front perineal area, Left upper leg, Face   Body parts bathed by helper: Left arm, Buttocks, Right upper leg, Right lower leg, Left lower leg     Bathing assist Assist Level: Moderate Assistance - Patient 50 - 74%     Upper Body Dressing/Undressing Upper body dressing   What is the patient wearing?: Pull over shirt    Upper body assist Assist Level: Moderate Assistance - Patient 50 - 74%    Lower Body Dressing/Undressing Lower body dressing      What is the patient wearing?: Pants     Lower body assist Assist for lower body dressing: 2 Helpers     Toileting Toileting Toileting Activity did not occur (Clothing management and hygiene only):  (2 assit stedy)  Toileting assist Assist for toileting: Total Assistance - Patient < 25%     Transfers Chair/bed transfer  Transfers assist     Chair/bed transfer assist level: Total Assistance - Patient < 25%     Locomotion Ambulation   Ambulation assist   Ambulation activity did not occur: Safety/medical concerns          Walk 10 feet activity   Assist  Walk 10 feet activity did not occur: Safety/medical concerns        Walk 50 feet activity   Assist Walk 50 feet with 2 turns activity did not occur: Safety/medical concerns         Walk 150 feet activity  Assist Walk 150 feet activity did not occur: Safety/medical concerns         Walk 10 feet on uneven surface  activity   Assist Walk 10 feet on uneven surfaces activity did not occur: Safety/medical concerns         Wheelchair     Assist Will patient use wheelchair at discharge?: Yes Type of Wheelchair: Manual    Wheelchair assist level: Dependent - Patient 0% Max wheelchair distance: 150    Wheelchair 50 feet with 2 turns activity    Assist        Assist Level:  Dependent - Patient 0%   Wheelchair 150 feet activity     Assist      Assist Level: Dependent - Patient 0%   Blood pressure 113/78, pulse 86, temperature 98.4 F (36.9 C), temperature source Oral, resp. rate 18, height 6' 1.5" (1.867 m), weight 106.8 kg, SpO2 97 %.    Medical Problem List and Plan: 1.  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  Antithrombotics: -DVT/anticoagulation: SCDs             -antiplatelet therapy: N/A 3. Pain Management: Tylenol as needed. Does not appear to be in pain.   11/13- pt denies pain by shaking head no- multiple times- con't tylenol prn 4. Mood: Provide emotional support             -antipsychotic agents: N/A 5. Neuropsych: This patient is not capable of making decisions on his own behalf. 6. Skin/Wound Care: Routine skin checks 7. Fluids/Electrolytes/Nutrition: Routine in and outs with follow-up chemistries 8.  Dysphagia.  NPO.  Alternative means of nutritional support.  Speech therapy follow-up, may need PEG if slow improvement  11/16- con't Cortrak-check MBS today   9.  Hypertension.  Norvasc 10 mg daily, hydralazine 100 mg every 8 hours, Normodyne 300 mg every 8 hours, lisinopril 20 mg twice daily.  Monitor with increased mobility Vitals:   09/26/20 2113 09/27/20 0510  BP: 116/79 113/78  Pulse: 87 86  Resp:    Temp:  98.4 F (36.9 C)  SpO2:  97%   Controlled , running low normal check orthostatics, can back off hydralazine to 50mg  TID  10.  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  Provide counseling 11.  Obesity.  BMI 30.67-->31.36-->31.02  Dietary follow-up  12. Incontinence of bowel and bladder  11/13- will try timed voiding, if possible, q2 hours while awake, to see if can keep pt more  dry.  13. Roof of mouth dried mucus now removed    LOS: 7 days A FACE TO FACE EVALUATION WAS PERFORMED  12/13 09/27/2020, 8:23 AM

## 2020-09-28 ENCOUNTER — Inpatient Hospital Stay (HOSPITAL_COMMUNITY): Payer: Self-pay | Admitting: Occupational Therapy

## 2020-09-28 ENCOUNTER — Inpatient Hospital Stay (HOSPITAL_COMMUNITY): Payer: Medicaid Other

## 2020-09-28 ENCOUNTER — Inpatient Hospital Stay (HOSPITAL_COMMUNITY): Payer: Self-pay | Admitting: *Deleted

## 2020-09-28 ENCOUNTER — Encounter (HOSPITAL_COMMUNITY): Payer: Self-pay | Admitting: Speech Pathology

## 2020-09-28 LAB — GLUCOSE, CAPILLARY
Glucose-Capillary: 136 mg/dL — ABNORMAL HIGH (ref 70–99)
Glucose-Capillary: 106 mg/dL — ABNORMAL HIGH (ref 70–99)

## 2020-09-28 MED ORDER — OSMOLITE 1.5 CAL PO LIQD
960.0000 mL | ORAL | Status: DC
  Administered 2020-09-28: 960 mL

## 2020-09-28 NOTE — Progress Notes (Signed)
Occupational Therapy Weekly Progress Note  Patient Details  Name: Jason Romero MRN: [id number] Date of Birth: 1971-04-19  Beginning of progress report period: September 21, 2020 End of progress report period: September 28, 2020  Today's Date: 09/28/2020 OT Individual Time: 0488-8916 OT Individual Time Calculation (min): 44 min    Patient has met 2 of 4 short term goals. Patient making slow progress toward goals. Patient with improvements in sitting balance, bed mobility in prep for bathing/dressing tasks, and functional transfers. When patient is focused, sitting balance can be Min guard at best but with fatigue and increased internal/external distraction patient can require up to Mod A with posterolateral pushing although improved since initial eval. Due to continued fluctuations with sitting balance, LB dressing completed at bed level with patient able to thread LLE with assist to thread RLE. Patient also able to bridge in supine with assist to maintain position of RLE. Patient able to complete grooming tasks at sink level with Min A for thoroughness and hand over hand assist for incorporation of RUE. Patient continues to demonstrate heavy hemiplegia in RUE/RLE, decreased R attention, apraxia, low frustration tolerance, and impulsivity limiting progress this reporting period.   Patient continues to demonstrate the following deficits: muscle weakness and muscle paralysis, decreased cardiorespiratoy endurance, impaired timing and sequencing, abnormal tone, unbalanced muscle activation, decreased coordination and decreased motor planning, decreased midline orientation and decreased attention to right and decreased attention, decreased awareness, decreased problem solving, decreased safety awareness, decreased memory and delayed processing and therefore will continue to benefit from skilled OT intervention to enhance overall performance with BADL, iADL and Reduce care partner burden.   Patient  progressing toward long term goals..  Continue plan of care.  OT Short Term Goals Week 1:  OT Short Term Goal 1 (Week 1): Patient will maintain static sitting balance at EOB with Min A in prep for ADLs. OT Short Term Goal 1 - Progress (Week 1): Progressing toward goal OT Short Term Goal 2 (Week 1): Patient will complete 2/3 parts of UB dressing task with use of hemi techinque. OT Short Term Goal 2 - Progress (Week 1): Progressing toward goal OT Short Term Goal 3 (Week 1): Patient complete 1/3 parts of LB dressing task seated EOB. OT Short Term Goal 3 - Progress (Week 1): Progressing toward goal OT Short Term Goal 4 (Week 1): Patient will complete 2/3 grooming tasks seated at sink level. OT Short Term Goal 4 - Progress (Week 1): Met Week 2:  OT Short Term Goal 1 (Week 2): Patient will maintain static sitting balance at EOB with Min A in prep for ADLs. OT Short Term Goal 2 (Week 2): Patient complete 1/3 parts of LB dressing task seated EOB without lateral LOB. OT Short Term Goal 3 (Week 2): Patient will complete 2/3 parts of UB dressing task with use of hemi techinque. OT Short Term Goal 4 (Week 2): Patient will complete squat-pivot transfers in 2 consecutive treatment sessions with Mod A and no more than 3 cues for safety awareness.  Skilled Therapeutic Interventions/Progress Updates:  Patient met lying supine in bed in agreement with OT treatment session. Patient found to be saturated in urine/feces requiring Total A for clothing/hygiene management at bed level. Patient able to roll R with Min to Mod A and to L with +2 assist. LB dressing at bed level. Supine to EOB in prep for unsupported sitting task. Patient initially required Mod A for static sitting balance progressing to Min A with periods of  Mod A to correct posterolateral pushing. Patient required Mod A and heavy cues to don shirt using hemi technique. Patient completed lateral scoots toward HOB on L with Mod A and multimodal cues for  sequencing and safety. Session concluded with patient lying supine in bed per RN request. Bed alarm activated and all needs within reach.   Therapy Documentation Precautions:  Precautions Precautions: Fall Precaution Comments: R hemiplegia, R inattention Restrictions Weight Bearing Restrictions: No General:     Therapy/Group: Individual Therapy  Lyllie Cobbins R Howerton-Davis 09/28/2020, 7:33 AM

## 2020-09-28 NOTE — Progress Notes (Signed)
NT reported pt ate 50% of meal. Called Dan to inform of intake and cortrak can be removed at greater than 50% of meal intake. NO for calorie count. Will continue plan of care.

## 2020-09-28 NOTE — Progress Notes (Addendum)
Patient ID: Jason Romero, male   DOB: 1971-03-09, 49 y.o.   MRN: [id number] Team Conference Report to Patient/Family  SW attempted to call patient spouse, no answer. Unable to leave voicemail, able to send text notification. SW awaiting follow up from family  [name] 09/28/2020, 1:16 PM  [phone]

## 2020-09-28 NOTE — Progress Notes (Signed)
Lunch- pt ate 50% of meal

## 2020-09-28 NOTE — Progress Notes (Signed)
Occupational Therapy Session Note  Patient Details  Name: Jason Romero MRN: [id number] Date of Birth: 1971/05/24  Today's Date: 09/28/2020 OT Individual Time: 1115-1200 OT Individual Time Calculation (min): 45 min    Short Term Goals: Week 1:  OT Short Term Goal 1 (Week 1): Patient will maintain static sitting balance at EOB with Min A in prep for ADLs. OT Short Term Goal 2 (Week 1): Patient will complete 2/3 parts of UB dressing task with use of hemi techinque. OT Short Term Goal 3 (Week 1): Patient complete 1/3 parts of LB dressing task seated EOB. OT Short Term Goal 4 (Week 1): Patient will complete 2/3 grooming tasks seated at sink level.  Skilled Therapeutic Interventions/Progress Updates:  Patient met lying supine in bed in agreement with OT treatment session. Patient found to be incontinent of bladder requiring assist for hygiene/clothing management. Shirt also soiled with fluid from NG tube. RN notified. +2 assist for LB dressing in supine. Patient the able to come to sitting with assist at RLE and trunk. Static sitting at EOB with initial assist to correct anterior and lateral LOB. Squat-pivot transfer to L with Mod A and multimodal cues for hand placement, pacing, and orientation to midline. At sink, patient able to doff UB clothing in supported sitting with Mod A and multimodal cues for use of hemi technique to don shirt. Patient with continued low frustration tolerance. Session concluded with patient seated in TIS wc with call bell within reach, belt alarm activated, and all needs met.   Therapy Documentation Precautions:  Precautions Precautions: Fall Precaution Comments: R hemiplegia, R inattention Restrictions Weight Bearing Restrictions: No General:    Therapy/Group: Individual Therapy  Jason Romero 09/28/2020, 7:26 AM

## 2020-09-28 NOTE — Patient Care Conference (Signed)
Inpatient RehabilitationTeam Conference and Plan of Care Update Date: 09/28/2020   Time: 10:09 AM    Patient Name: Jason Romero      Medical Record Number: [id number]  Date of Birth: [birth year] Sex: Male         Room/Bed: 4W03C/4W03C-01 Payor Info: Payor: MEDICAID POTENTIAL / Plan: MEDICAID POTENTIAL / Product Type: *No Product type* /    Admit Date/Time:  09/20/2020  4:04 PM  Primary Diagnosis:  ICH (intracerebral hemorrhage) [facility])  Hospital Problems: Principal Problem:   ICH (intracerebral hemorrhage) [facility])    Expected Discharge Date: Expected Discharge Date: 10/19/20  Team Members Present: Physician leading conference: Dr. Claudette Laws Care Coodinator Present: Chana Bode, RN, BSN, CRRN;Christina Vita Barley, BSW Nurse Present: Willey Blade, RN PT Present: Harless Litten, PTA OT Present: Lina Sayre, OT SLP Present: Suzzette Righter, CF-SLP PPS Coordinator present : Edson Snowball, Park Breed, SLP     Current Status/Progress Goal Weekly Team Focus  Bowel/Bladder   Pt incont of b/b. LBM 09/26/20- multiple type 6 and 7 BMs  pt will regain continence of b/b  Q2h toileting   Swallow/Nutrition/ Hydration   Dys 3/honey thick liquids, full supervision (after MBS 11/17)  Min A  tolerance diet and carryover swallow strategies, working toward advancement and/or water protocol   ADL's   Mod A UB bathing/dressing, +2 assist LB bathing/dressing at bed level, Mod to Max A sqaut-pivot transfers to TIS wc with multimodal cues. Impulsive and low frustration tolerance.  Min to Mod A  Sitting balance, transfers, R attention, praxis, awareness, self-care re-education   Mobility   minA rolling to R, maxA rolling to L, maxA supine to sit, maxA 1-2 squat pivot transfers, highly impulsive  minA bed mobilitiy, modA transfers, supervision w/c mobility  sitting balance, OOB tolerance, transfers, standing tolerance, R NMR, cognitive remediation, d/c planning   Communication    Max A word/phrase level, Mod A communication board.  Mod-Min A  carryover of communication board, verbalizations in naming and functional phrases   Safety/Cognition/ Behavioral Observations  Min A  Min-Supervision A  verbal error awareness and sustained attention in functional tasks   Pain   Pt c/o generalized pain. PRN tylenol effective  Pt will be free of pain  Assess pain qshift   Skin   no obvious signs of skin breakdown at this time  Pt will be free of breakdown an further infection.  Assess skin qshift/PRN     Discharge Planning:  Discharginh home with spouse, sister and mother (Spouse has COPD and sister lives out of town) No previous DME, 1 Level home, level entry   Team Discussion: Patient presents after subcortical infarct. MD notes if patient does well with intake at lunch; plan to discontinue cortrak. May need to supplement fluids at night with hydration with honey thick liquids. Note aphasia and inconsistent responses. CBGs slightly elevated. Patient is incontinent of bowel and bladder; timed toileting initiated. Patient unable to use call bell however was able to communicate need for toileting. Progress impaired by impulsivity and low frustration tolerance level and poor attention, decreased ability to focus.  Patient on target to meet rehab goals: yes, mod assist goals overall; min assist for transfers  *See Care Plan and progress notes for long and short-term goals.   Revisions to Treatment Plan:  Communication board MBS completed = advance to D3-Honey thick diet Parallel bar pre-gait activities  Teaching Needs: Transfers, toileting, medications, etc.   Current Barriers to Discharge: Decreased caregiver support, Home enviroment access/layout, Incontinence  and Behavior  Possible Resolutions to Barriers: Family education Do not anticipate patient will be a functional ambulator at discharge     Medical Summary Current Status: pased MBS, still incont Bowel adn  bladder, R HP, aphasic  Barriers to Discharge: Incontinence;Nutrition means;Other (comments)  Barriers to Discharge Comments: impulsivity Possible Resolutions to [employer] Focus: Work on impulsivity which should help with safety, start po trials   Continued Need for Acute Rehabilitation Level of Care: The patient requires daily medical management by a physician with specialized training in physical medicine and rehabilitation for the following reasons: Direction of a multidisciplinary physical rehabilitation program to maximize functional independence : Yes Medical management of patient stability for increased activity during participation in an intensive rehabilitation regime.: Yes Analysis of laboratory values and/or radiology reports with any subsequent need for medication adjustment and/or medical intervention. : Yes   I attest that I was present, lead the team conference, and concur with the assessment and plan of the team.   Chana Bode B 09/28/2020, 2:40 PM

## 2020-09-28 NOTE — Progress Notes (Signed)
Physical Therapy Session Note  Patient Details  Name: Jason Romero MRN: [id number] Date of Birth: [birth year]  Today's Date: 09/28/2020 PT Individual Time: 1350-1500 PT Individual Time Calculation (min): 70 min   Short Term Goals: Week 1:  PT Short Term Goal 1 (Week 1): Pt will perfom bed mobility with mod assist and use of bed features PT Short Term Goal 2 (Week 1): Pt will propell WC 150f with min assist PT Short Term Goal 3 (Week 1): Pt will tolerate sitting up in WC 2 hours between therapies PT Short Term Goal 4 (Week 1): Pt will perform sitting balance EOB with mod assist from PT up to 5 minutes  Skilled Therapeutic Interventions/Progress Updates: Pt presented in TIS agreeable to therapy. Pt denies pain and initially nodding head "no" when asked if ok to work on standing. PTA then asked pt if need to use bathroom with pt nodding head "yes". [facility] and [place] noted to be incontinent of urine and unable to perform additional void. Pt performed stand from toilet with modA and PTA performed peri-care with +2 assistance for clothing management. Pt then returned to TIS and transported to rehab gym for energy conservation. Pt participated in standing balance in parallel bars with pt requiring modA for standing and BMel Almond SPT assisting with RLE management. Pt participated in lateral wt shifts in parallel bars 2 x 10 with seated rest between bouts. On second bout pt was able to perform lateral shifts to R with CGA and SPT blocking R knee. Pt then transported to ortho gym and participated in squat pivot transfer to R with maxA and max multimodal cues due to continued impulsivity. Performed lateral scoot to middle of mat with modA and max cues for sequencing. Pt performed better when simple steps provided. Mirror placed in front of pt for visual feedback with noted improvement in sitting balance with overall CGA however with x1  bout of posterior LOB requiring minA for correction. Pt then participated in STS x 2 requiring +2 for safety with SPT blocking R knee. Pt then participated in picking up horseshoes from top of mirror with LUE reaching forward and to R crossing midline. Pt able to maintain fair balance with support when performing reaching tasks. Once completed pt performed lateral scoot towards TIS then performed squat pivot to L with modA to return to TIS. Pt transported back to room and performed squat pivot reaching for bed rail with min/modA. Pt required mod/maxA EOB to supine due to impulsiveness to return to bed. Once in bed pt was repositioned to comfort and left with pillow supported under R hemibody, call bell within reach and bed alarm on. Prior to leaving PTA reinforced use of call button for assistance to use bathroom, upon leaving room pt's wife followed therapists expressing that pt pointing to brief and responded to wife when asked if needed to use bathroom. PTA assisted with urinal management and pt with continent void. Pt then left in bed with call bell within reach and needs met.      Therapy Documentation Precautions:  Precautions Precautions: Fall Precaution Comments: R hemiplegia, R inattention Restrictions Weight Bearing Restrictions: No General:   Vital Signs: Therapy Vitals Temp: 98.5 F (36.9 C) Pulse Rate: 75 Resp: 18 BP: 128/80 Patient Position (if appropriate): Lying Oxygen Therapy SpO2: 95 % O2 Device: Room Air Pain:   Mobility:   Locomotion :    Trunk/Postural Assessment :  Balance:   Exercises:   Other Treatments:      Therapy/Group: Individual Therapy  Daziah Hesler 09/28/2020, 4:02 PM

## 2020-09-28 NOTE — Progress Notes (Signed)
[place] PHYSICAL MEDICINE & REHABILITATION PROGRESS NOTE   Subjective/Complaints:   Remains aphasic.   MBS rescheduled for today.  ROS- unable to assess due to aphasia   Objective:   No results found. No results for input(s): WBC, HGB, HCT, PLT in the last 72 hours. No results for input(s): NA, K, CL, CO2, GLUCOSE, BUN, CREATININE, CALCIUM in the last 72 hours.  Intake/Output Summary (Last 24 hours) at 09/28/2020 0920 Last data filed at 09/27/2020 1840 Gross per 24 hour  Intake 0 ml  Output --  Net 0 ml        Physical Exam: Vital Signs Blood pressure 132/87, pulse 78, temperature 98 F (36.7 C), resp. rate 18, height 6' 1.5" (1.867 m), weight 106.8 kg, SpO2 100 %.   General: No acute distress Mood and affect are appropriate Heart: Regular rate and rhythm no rubs murmurs or extra sounds Lungs: Clear to auscultation, breathing unlabored, no rales or wheezes Abdomen: Positive bowel sounds, soft nontender to palpation, nondistended Extremities: No clubbing, cyanosis, or edema Skin: No evidence of breakdown, no evidence of rash   Skin: No evidence of breakdown, no evidence of rash Neurologic: Cranial nerves II through XII intact, motor strength is 5/5 inleft , 0/5 RIght  deltoid, bicep, tricep, grip, hip flexor, knee extensors, ankle dorsiflexor and plantar flexor Sensory exam absent pinch RUE and RLE  Musculoskeletal: no pain with range of motion in all 4 extremities. No joint swelling   Assessment/Plan: 1. Functional deficits which require 3+ hours per day of interdisciplinary therapy in a comprehensive inpatient rehab setting.  Physiatrist is providing close team supervision and 24 hour management of active medical problems listed below.  Physiatrist and rehab team continue to assess barriers to discharge/monitor patient progress toward functional and medical goals  Care Tool:  Bathing    Body parts bathed by patient: Right arm, Chest, Abdomen, Front  perineal area, Left upper leg, Face   Body parts bathed by helper: Left arm, Buttocks, Right upper leg, Right lower leg, Left lower leg     Bathing assist Assist Level: Moderate Assistance - Patient 50 - 74%     Upper Body Dressing/Undressing Upper body dressing   What is the patient wearing?: Pull over shirt    Upper body assist Assist Level: Moderate Assistance - Patient 50 - 74%    Lower Body Dressing/Undressing Lower body dressing      What is the patient wearing?: Pants     Lower body assist Assist for lower body dressing: 2 Helpers     Toileting Toileting Toileting Activity did not occur (Clothing management and hygiene only):  (2 assit stedy)  Toileting assist Assist for toileting: Dependent - Patient 0%     Transfers Chair/bed transfer  Transfers assist     Chair/bed transfer assist level: Moderate Assistance - Patient 50 - 74%     Locomotion Ambulation   Ambulation assist   Ambulation activity did not occur: Safety/medical concerns  Assist level: Maximal Assistance - Patient 25 - 49% Assistive device: Lite Gait Max distance: 3f   Walk 10 feet activity   Assist  Walk 10 feet activity did not occur: Safety/medical concerns  Assist level: Maximal Assistance - Patient 25 - 49% Assistive device: Lite Gait   Walk 50 feet activity   Assist Walk 50 feet with 2 turns activity did not occur: Safety/medical concerns         Walk 150 feet activity   Assist Walk 150 feet activity did not  occur: Safety/medical concerns         Walk 10 feet on uneven surface  activity   Assist Walk 10 feet on uneven surfaces activity did not occur: Safety/medical concerns         Wheelchair     Assist Will patient use wheelchair at discharge?: Yes Type of Wheelchair: Manual    Wheelchair assist level: Dependent - Patient 0% Max wheelchair distance: 150    Wheelchair 50 feet with 2 turns activity    Assist        Assist Level:  Dependent - Patient 0%   Wheelchair 150 feet activity     Assist      Assist Level: Dependent - Patient 0%   Blood pressure 132/87, pulse 78, temperature 98 F (36.7 C), resp. rate 18, height 6' 1.5" (1.867 m), weight 106.8 kg, SpO2 100 %.    Medical Problem List and Plan: 1.  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 Formulized individual treatment plan based on medical history, underlying problem and comorbidities.   -Continue CIR  2.  Antithrombotics: -DVT/anticoagulation: SCDs             -antiplatelet therapy: N/A 3. Pain Management: Tylenol as needed. Does not appear to be in pain.   11/13- pt denies pain by shaking head no- multiple times- con't tylenol prn 4. Mood: Provide emotional support             -antipsychotic agents: N/A 5. Neuropsych: This patient is not capable of making decisions on his own behalf. 6. Skin/Wound Care: Routine skin checks 7. Fluids/Electrolytes/Nutrition: Routine in and outs with follow-up chemistries 8.  Dysphagia.  NPO.  Alternative means of nutritional support.  MBS- ok for D3, honey thick May d/c cortrak if lunch intake>50% 9.  Hypertension.  Norvasc 10 mg daily, hydralazine 100 mg every 8 hours, Normodyne 300 mg every 8 hours, lisinopril 20 mg twice daily.  Monitor with increased mobility Vitals:   09/28/20 0359 09/28/20 0651  BP: (!) 141/85 132/87  Pulse: 82 78  Resp:    Temp: 98 F (36.7 C)   SpO2: 100%    Controlled , running low normal check orthostatics, can back off hydralazine to 37m TID  10.  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  Provide counseling 11.  Obesity.  BMI  30.67-->31.36-->31.02  Dietary follow-up  12. Incontinence of bowel and bladder  Trouble using call bell also is aphasic, cont efforts at timed toileting   13. Roof of mouth dried mucus now removed    LOS: 8 days A FACE TO FACE EVALUATION WAS PERFORMED  ACharlett Blake11/17/2021, 9:20 AM

## 2020-09-28 NOTE — Progress Notes (Signed)
Modified Barium Swallow Progress Note  Patient Details  Name: Jason Romero MRN: [id number] Date of Birth: February 14, 1971  Today's Date: 09/28/2020  Modified Barium Swallow completed.  Full report located under Chart Review in the Imaging Section.  Brief recommendations include the following:  Clinical Impression   Although oropharyngeal dysphagia still evident, pt's swallow function is improving in comparison to last MBSS 09/16/20. Pt's mastication of soft solids was mildly prolonged, however efficient; he demonstrated much greater oral control of boluses, and bolus cohesion of Dys 1 and Dys 3 textures. Aspiration X2 with delayed cough response and deep penetration noted during trials of thin barium, most likely due to pt's delayed initiation of swallow sequence, which occurred at the level of the pyriform sinuses. Penetration to the level of the vocal folds without ejection also noted with nectar. Due to receptive and expressive language and cognitive deficits, pt was unable to follow any commands for compensatory strategies or maneuvers, therefore, not viable option at this time. Honey thick barium resulted in a slightly more timely swallow initiation, and reduction in depth and frequency of penetration into laryngeal vestibule (remained above vocal folds) and some was ejected during swallow). Given results of today's study, would recommend pt begin a Dys 3 (mechanical soft solid) texture diet, honey thick liquids, and medications whole in purees. He will require full supervision during meals to ensure use of swallow strategies and reduce impulsivity during intake. ST will continue to provide skilled interventions to work toward further advancement and increase independence with use of safe swallow strategies.    Swallow Evaluation Recommendations       SLP Diet Recommendations: Honey thick liquids;Dysphagia 3 (Mech soft) solids   Liquid Administration via: Cup   Medication Administration:  Whole meds with puree   Supervision: Staff to assist with self feeding;Full supervision/cueing for compensatory strategies   Compensations: Minimize environmental distractions;Small sips/bites;Slow rate;Lingual sweep for clearance of pocketing   Postural Changes: Remain semi-upright after after feeds/meals (Comment)   Oral Care Recommendations: Oral care QID   Other Recommendations: Have oral suction available    Jason Romero 09/28/2020,9:54 AM

## 2020-09-28 NOTE — Progress Notes (Signed)
Nutrition Follow-up  DOCUMENTATION CODES:   Not applicable  INTERVENTION:   - 24-hour calorie count ordered, RD to provide results tomorrow  Transition to nocturnal tube feeding regimen via Cortrak: - Osmolite 1.5 @ 80 ml/hr to run for 12 hours from 1800 to 0600 (total of 960 ml) - Continue ProSource 45 ml TID - Continue free water flushes of 200 ml q 4 hours  Tube feeding regimen provides 1560 kcal, 93 grams of protein, and 732 ml of H2O (meets 68% of kcal needs and 81% of protein needs).  Total free water with flushes: 1932 ml  - Magic Cup TID with meals, each supplement provides 290 kcal and 9 grams of protein  - Double protein portions TID with meals  NUTRITION DIAGNOSIS:   Inadequate oral intake related to dysphagia as evidenced by NPO status.  Progressing, pt now on dysphagia 3 diet with honey thick liquids  GOAL:   Patient will meet greater than or equal to 90% of their needs  Progressing  MONITOR:   PO intake, Supplement acceptance, Diet advancement, Labs, Weight trends, TF tolerance  REASON FOR ASSESSMENT:   Consult Enteral/tube feeding initiation and management  ASSESSMENT:   49 year old male with PMH of COPD, tobacco abuse, HTN, seizures, atrial fibrillation. Presented 09/11/20 with right-sided weakness and facial droop. Cranial CT scan showed intraparenchymal hematoma in the left basal ganglia. ICH felt to be related to hypertensive crisis. Pt was placed on Cleviprex for blood pressure control. Pt is currently NPO with Cortrak in place for tube feeds. Admitted to CIR on 11/09.  11/17 - MBS, diet advanced to dysphagia 3 with honey-thick liquids  Diet advanced this morning to dysphagia 3 with honey thick liquids. Tube feeds paused at 0952 to assess how pt eats for lunch now that diet has been advanced.  Weight stable compared to CIR admit weight.  Pt completed 50% of lunch meal today. Plan is to leave Cortrak, initiate calorie count, and transition to  nocturnal tube feeds. RN aware of plan and calorie count envelope has been placed.  Current TF: Osmolite 1.5 @ 75 ml/hr, ProSource TF 45 ml TID, free water 200 ml q 4 hours  Medications reviewed and include: protonix, senokot  Labs reviewed: sodium 147, BUN 38, elevated LFTs CBGs: 109-136 x 24 hours  NUTRITION - FOCUSED PHYSICAL EXAM:    Most Recent Value  Orbital Region No depletion  Upper Arm Region No depletion  Thoracic and Lumbar Region No depletion  Buccal Region No depletion  Temple Region No depletion  Clavicle Bone Region Mild depletion  Clavicle and Acromion Bone Region Mild depletion  Scapular Bone Region No depletion  Dorsal Hand Moderate depletion  Patellar Region No depletion  Anterior Thigh Region No depletion  Posterior Calf Region No depletion  Edema (RD Assessment) None  Hair Reviewed  Eyes Reviewed  Mouth Reviewed  Skin Reviewed  Nails Reviewed       Diet Order:   Diet Order            DIET DYS 3 Room service appropriate? No; Fluid consistency: Honey Thick  Diet effective now                 EDUCATION NEEDS:   Education needs have been addressed  Skin:  Skin Assessment: Reviewed RN Assessment  Last BM:  09/26/20  Height:   Ht Readings from Last 1 Encounters:  09/20/20 6' 1.5" (1.867 m)    Weight:   Wt Readings from Last 1 Encounters:  09/27/20 106.8 kg    BMI:  Body mass index is 30.64 kg/m.  Estimated Nutritional Needs:   Kcal:  2300-2500  Protein:  115-135 grams  Fluid:  >/= 2.0 L    Mertie Clause, MS, RD, LDN Inpatient Clinical Dietitian Please see [facility] for contact information.

## 2020-09-29 ENCOUNTER — Inpatient Hospital Stay (HOSPITAL_COMMUNITY): Payer: Self-pay | Admitting: Physical Therapy

## 2020-09-29 ENCOUNTER — Inpatient Hospital Stay (HOSPITAL_COMMUNITY): Payer: Self-pay | Admitting: Speech Pathology

## 2020-09-29 ENCOUNTER — Inpatient Hospital Stay (HOSPITAL_COMMUNITY): Payer: Self-pay | Admitting: Occupational Therapy

## 2020-09-29 LAB — GLUCOSE, CAPILLARY
Glucose-Capillary: 117 mg/dL — ABNORMAL HIGH (ref 70–99)
Glucose-Capillary: 104 mg/dL — ABNORMAL HIGH (ref 70–99)
Glucose-Capillary: 115 mg/dL — ABNORMAL HIGH (ref 70–99)

## 2020-09-29 MED ORDER — PANTOPRAZOLE SODIUM 40 MG PO TBEC
40.0000 mg | DELAYED_RELEASE_TABLET | Freq: Every day | ORAL | Status: DC
  Administered 2020-09-30 – 2020-10-19 (×20): 40 mg via ORAL
  Filled 2020-09-29 (×20): qty 1

## 2020-09-29 MED ORDER — SENNA 8.6 MG PO TABS
1.0000 | ORAL_TABLET | Freq: Two times a day (BID) | ORAL | Status: DC
  Administered 2020-09-29 – 2020-10-16 (×32): 8.6 mg via ORAL
  Filled 2020-09-29 (×38): qty 1

## 2020-09-29 MED ORDER — LISINOPRIL 20 MG PO TABS
20.0000 mg | ORAL_TABLET | Freq: Two times a day (BID) | ORAL | Status: DC
  Administered 2020-09-29 – 2020-10-19 (×40): 20 mg via ORAL
  Filled 2020-09-29 (×40): qty 1

## 2020-09-29 MED ORDER — HYDRALAZINE HCL 50 MG PO TABS
50.0000 mg | ORAL_TABLET | Freq: Three times a day (TID) | ORAL | Status: DC
  Administered 2020-09-29 – 2020-10-01 (×6): 50 mg via ORAL
  Filled 2020-09-29 (×6): qty 1

## 2020-09-29 MED ORDER — LABETALOL HCL 100 MG PO TABS
300.0000 mg | ORAL_TABLET | Freq: Three times a day (TID) | ORAL | Status: DC
  Administered 2020-09-29 – 2020-10-19 (×58): 300 mg via ORAL
  Filled 2020-09-29 (×61): qty 3

## 2020-09-29 MED ORDER — AMLODIPINE BESYLATE 10 MG PO TABS
10.0000 mg | ORAL_TABLET | Freq: Every day | ORAL | Status: DC
  Administered 2020-09-30 – 2020-10-19 (×20): 10 mg via ORAL
  Filled 2020-09-29 (×20): qty 1

## 2020-09-29 NOTE — Progress Notes (Signed)
Occupational Therapy Session Note  Patient Details  Name: Jason Romero MRN: [id number] Date of Birth: 04-Mar-1971  Today's Date: 09/29/2020 OT Individual Time: 9983-3825 OT Individual Time Calculation (min): 44 min    Short Term Goals: Week 2:  OT Short Term Goal 1 (Week 2): Patient will maintain static sitting balance at EOB with Min A in prep for ADLs. OT Short Term Goal 2 (Week 2): Patient complete 1/3 parts of LB dressing task seated EOB without lateral LOB. OT Short Term Goal 3 (Week 2): Patient will complete 2/3 parts of UB dressing task with use of hemi techinque. OT Short Term Goal 4 (Week 2): Patient will complete squat-pivot transfers in 2 consecutive treatment sessions with Mod A and no more than 3 cues for safety awareness.  Skilled Therapeutic Interventions/Progress Updates:  Patient met lying supine in bed. Fall earlier this date from [place] wc. No facial grimacing at rest or with activity to indicate pain. Patient declined OOB activity this date but in agreement with bed level/EOB tasks. RUE NMR with education on self-ROM with hand over hand assist for pace/form. Patient with soiled shirt in agreement with UB bathing at EOB. Supine to EOB with assist at RLE and trunk. HOB elevated. Patient able to doff/don UB clothing and wash UB seated EOB with Min guard to Mod A to maintain sitting balance. Assist to wash LUE. Return to supine with assist to guide trunk and bring RLE from EOB to bed surface. Patient then able to partially bridge with use of LUE on bed surface to shift hips toward middle of bed. Patient with continued frustration about being unable to verbally express needs. Attempted use of communication board but patietn unable 2/2 internal/external distraction and decreased attention. Session concluded with patient lying supine in bed with call bell within reach, bed alarm activated, and all needs met.   Therapy Documentation Precautions:  Precautions Precautions:  Fall Precaution Comments: R hemiplegia, R inattention Restrictions Weight Bearing Restrictions: No General:    Therapy/Group: Individual Therapy  Derriona Branscom R [place] 09/29/2020, 7:18 AM

## 2020-09-29 NOTE — Progress Notes (Signed)
Wife called back; informed of assisted fall earlier with no injuries today but may be a little sore tomorrow.

## 2020-09-29 NOTE — Progress Notes (Signed)
Nurse called to room, alarm sounding, NT entered room to see pt standing from w/c and reaching out. Pt fell to floor without hitting head, no injuries noted, no c/o pain. Pt transferred to bed via maximove. Called [employer] [phone], voicemail not available, called [phone], voicemail not set up yet. Called Harvel Ricks, PA to notify of fall without head injuries, no new orders at this time.

## 2020-09-29 NOTE — Progress Notes (Signed)
Physical Therapy Weekly Progress Note  Patient Details  Name: Jason Romero MRN: [id number] Date of Birth: [birth year]  Beginning of progress report period: September 21, 2020 End of progress report period: September 29, 2020  Today's Date: 09/29/2020 PT Individual Time: 7989-2119 PT Individual Time Calculation (min): 42 min  Missed 60 minutes of skilled physical therapy due to fatigue.    Patient has met 2 of 4 short term goals. Pt has met STGs of sitting balance improvement and tolerating sitting up in [facility] for 2 hours between therapies. He has not yet met his goals of mod assist for bed mobility or propelling a WC for 145f using min assist. He still demonstrates difficulty rolling to his L with bed mobility due to R sided impairments and continued inattention to R side, however it is improving slowly. Pt is making significant progress towards standing tolerance and ambulation each day and is more eager/agreeable to participate in skilled therapy.   Patient continues to demonstrate the following deficits muscle weakness, muscle joint tightness and muscle paralysis, decreased cardiorespiratoy endurance, impaired timing and sequencing, unbalanced muscle activation, motor apraxia, ataxia, decreased coordination and decreased motor planning, decreased visual perceptual skills and decreased visual motor skills, decreased attention to right and decreased motor planning, decreased initiation, decreased attention, decreased awareness, decreased problem solving, decreased safety awareness and delayed processing and decreased sitting balance, decreased standing balance, decreased postural control, hemiplegia and decreased balance strategies and therefore will continue to benefit from skilled PT intervention to increase functional independence with mobility.  Patient progressing toward long term goals..  Continue plan of care.  PT Short Term Goals Week 1:  PT Short Term Goal 1 (Week 1): Pt will perfom bed  mobility with mod assist and use of bed features PT Short Term Goal 2 (Week 1): Pt will propell WC 101fwith min assist PT Short Term Goal 3 (Week 1): Pt will tolerate sitting up in WC 2 hours between therapies PT Short Term Goal 4 (Week 1): Pt will perform sitting balance EOB with mod assist from PT up to 5 minutes Week 2:     Skilled Therapeutic Interventions/Progress Updates:    Session 1: Pt received semi-reclined in bed and agreeable to PT. Pt denies pain at this time. Pt able to come to sit on L EOB with mod assist for scooting and RLE management. PT assists in donning of pants and shoes with pt in sitting, requiring x2 for safety. Pt performs sit>stand with max assist for standing balance and mod assist for LE dressing. Pt transferred to tilt in space via stand pivot transfer to the R with max assist for full upright posture, R LE management, verbal cues for successful completion of transfer. [facility] for time management and energy conservation to hallway with railing to perform ambulation.   Pt positioned with L side of body against wall in order to provide stability and LUE support through railing. Pt sit >stand (using L railing) with mod assist to achieve full trunk extension and RLE extension. Pt instructed to hold onto railing with LUE and PT placed RUE around shoulder for support and access to trunk for stability. Pt ambulated ~[id number]) with rest breaks in between. Pt requiring max assist x2 for RLE advancement in swing phase, extension in stance phase, and verbal cueing for LLE timing and sequencing to achieve proper gait pattern. PT used elbow on glute to help facilitate hip extension and upright posture. WC follow behind for safety.  Pt returned to room in [facility] total assist and left sitting up with lap tray in place, chair alarm on, needs met, and call bell in reach. Nursing in room upon departure.   Session 2: Pt received in bed, asleep, unable to arouse. Pt  sustained fall earlier in the day. Will attempt to make up time at a later date provided pt is medically appropriate for therapy.    Therapy Documentation Precautions:  Precautions Precautions: Fall Precaution Comments: R hemiplegia, R inattention Restrictions Weight Bearing Restrictions: No Vital Signs: Therapy Vitals Pulse Rate: 95 BP: (!) 129/95   Therapy/Group: Individual Therapy  Gaylord Shih, SPT 09/29/2020, 10:09 AM

## 2020-09-29 NOTE — Progress Notes (Signed)
   09/29/20 1115  What Happened  Was fall witnessed? Yes  Who witnessed fall? Eliezer Lofts, NT  Patients activity before fall ambulating-unassisted  Point of contact buttocks  Was patient injured? No  Patient found on floor  Found by Staff-comment Eliezer Lofts, NT)  Stated prior activity other (comment) (aphasic)  Follow Up  MD notified Marlowe Shores, PA  Time MD notified [phone]  Family notified  (called contact numbers without answer or way to leave vm)  Time family notified  (attempted 1123)  Additional tests No  Progress note created (see row info) Yes  Adult Fall Risk Assessment  Risk Factor Category (scoring not indicated) High fall risk per protocol (document High fall risk)  Patient Fall Risk Level High fall risk  Adult Fall Risk Interventions  Required Bundle Interventions *See Row Information* High fall risk - low, moderate, and high requirements implemented  Additional Interventions Use of appropriate toileting equipment (bedpan, BSC, etc.);PT/OT need assessed if change in mobility from baseline;Lap belt while in chair/wheelchair  Screening for Fall Injury Risk (To be completed on HIGH fall risk patients) - Assessing Need for Low Bed  Risk For Fall Injury- Low Bed Criteria Admitted as a result of a fall  Will Implement Low Bed and Floor Mats No - Criteria no longer met for low bed  Screening for Fall Injury Risk (To be completed on HIGH fall risk patients who do not meet crieteria for Low Bed) - Assessing Need for Floor Mats Only  Risk For Fall Injury- Criteria for Floor Mats None identified - No additional interventions needed  Will Implement Floor Mats Yes  Vitals  BP (!) 148/89  BP Location Left Arm  BP Method Automatic  Patient Position (if appropriate) Lying  Pulse Rate 75  Pulse Rate Source Monitor  Oxygen Therapy  SpO2 98 %  O2 Device Room Air  Pain Assessment  Pain Scale 0-10  Pain Score 0  Neurological  Neuro (WDL) X  Level of Consciousness Alert  Orientation Level  Other (comment) (exp aphasia)  Cognition Follows commands  Speech Expressive aphasia  Pupil Assessment  No  R Hand Grip Absent  L Hand Grip Moderate   RUE Motor Response Non-purposeful movment  RUE Sensation No sensation (absent)  RLE Motor Response Non-purposeful movement  RLE Sensation No sensation (absent)  Neuro Symptoms Fatigue  Neuro symptoms relieved by Rest  Glasgow Coma Scale  Eye Opening 4  Best Verbal Response (NON-intubated) 2  Best Motor Response 6  Glasgow Coma Scale Score 12  Musculoskeletal  Musculoskeletal (WDL) X  Assistive Device Stedy  Generalized Weakness Yes  Weight Bearing Restrictions No  Musculoskeletal Details  RUE Paralysis  RLE Paralysis  Integumentary  Integumentary (WDL) X  Skin Turgor Non-tenting

## 2020-09-29 NOTE — Progress Notes (Signed)
Speech Language Pathology Weekly Progress and Session Note  Patient Details  Name: Jason Romero MRN: [id number] Date of Birth: Apr 27, 1971  Beginning of progress report period: September 21, 2020 End of progress report period: September 29, 2020  Today's Date: 09/29/2020 SLP Individual Time: 1000-1055 SLP Individual Time Calculation (min): 55 min  Short Term Goals: Week 1: SLP Short Term Goal 1 (Week 1): Pt will accept therapeutic trials of thin H2O and purees with minimal overt s/sx aspiration X3 prior to repeat MBSS. SLP Short Term Goal 1 - Progress (Week 1): Met SLP Short Term Goal 2 (Week 1): Pt will sustain attention to functional tasks for 5 minute intervals with Mod A cues for redirection. SLP Short Term Goal 2 - Progress (Week 1): Met SLP Short Term Goal 3 (Week 1): Pt will repeat at the word level with 50% accuracy provided Max A multimodal cues. SLP Short Term Goal 3 - Progress (Week 1): Met SLP Short Term Goal 4 (Week 1): Pt will produce automatic speech sequences with 75% accuracy provided Max A multimodal cues. SLP Short Term Goal 4 - Progress (Week 1): Progressing toward goal SLP Short Term Goal 5 (Week 1): Pt will demonstrate ability to use gestures and/or a communication board to communication basic wants and needs with Mod A multimodal cues. SLP Short Term Goal 5 - Progress (Week 1): Met    New Short Term Goals: Week 2: SLP Short Term Goal 1 (Week 2): Pt will consume current dysphagia 3 texture diet and honey thick liquids with Min A verbal and visual cueing for use of compensatory stategies and minimal overt s/sx aspiration. SLP Short Term Goal 2 (Week 2): Pt will consume therapeutic trials of thin H2O with SLP with minimal overt s/sx aspiration and no appreciable change in daily vitals across 1 week to demonstrate readiness repeat insturmental testing. SLP Short Term Goal 3 (Week 2): Pt will repeat at the word level with 75% accuracy provided Max A multimodal cues. SLP  Short Term Goal 4 (Week 2): Pt will produce basic CV words with Max A multimodal cues. SLP Short Term Goal 5 (Week 2): Pt will demonstrate ability to use gestures and/or a communication board to communication basic wants and needs with Min A multimodal cues. SLP Short Term Goal 6 (Week 2): Pt will detect verbal and/or functional errors with Mod A multimodal cues.  Weekly Progress Updates: Pt has made functional gains and met 4 out of 5 short term goals. Pt is currently Max assist for functional verbal communication and Mod A for multimodal communication due to severe expressive and mild receptive aphasia. His cognitive deficits in attention, impulsivity, and recall also present additional challenges to his successful use of a communication board at times. Pt particpiated in PO trials and an MBSS this week and able to initiate a Dysphagia 3 (mechanical soft), honey thick liquid diet and Cortrak is approved to be removed. Pt and family education is ongoing, although family would benefit from more intensive education regarding swallowing and cognitive-communication deficits closer to discharge. Pt would continue to benefit from skilled ST while inpatient in order to maximize functional independence and reduce burden of care prior to discharge. Anticipate that pt will need 24/7 supervision at discharge in addition to [place] follow up at next level of care.       Intensity: Minumum of 1-2 x/day, 30 to 90 minutes Frequency: 3 to 5 out of 7 days Duration/Length of Stay: 10/19/20 Treatment/Interventions: Cognitive remediation/compensation;Cueing hierarchy;Dysphagia/aspiration precaution training;Functional tasks;Internal/external  aids;Multimodal communication approach;Speech/Language facilitation;Therapeutic Activities;Patient/family education   Daily Session  Skilled Therapeutic Interventions: Pt was seen for skilled ST targeting dysphagia and communication goals. SLP facilitated session with skilled  observation of pt consuming current diet of dysphagia 3 (mech soft) snack with honey thick liquids - recently initiated this diet after MBSS. He demonstrated efficient mastication with trace lingual residue of Dys 3 textures, Min verbal cueing provided for awareness of this and use of strategies to clear it through use of liquid wash. He is still impulsive with intake, Moderate cueing required for smaller sips/slower rate of drinking. One immediate cough noted after a very large sip of honey from cup (consumed total of 8 oz during session). Recommend continue current diet with full supervision to ensure use of strategies. Communication interventions addressed multimodal and verbal communication skills. Although he indicated through head nod "no" that he has not been using it, he demonstrated improved accuracy in ability to use basic picture and word communication board today in comparison to previous attempts (~90% accuracy with Min A verbal cues). He became increasingly frustrated with attempts to verbalize at the word level but able to produce "banana" "sandwich" "apple" and "bread" with Max a picture supports, phonemic cues, and written cue. Spontaneous verbalizations were limited to "yeah", I can't" "I know" and "go". Mod A verbal cues required for sustained attention to tasks throughout session today. He continues to require intense encouragement to participate in to self-monitor frustration with communication. Pt left sitting in chair with alarm set and needs within reach. Continue per current plan of care.        Pain Pain Assessment Pain Scale: 0-10 Pain Score: 0-No pain  Therapy/Group: Individual Therapy  Arbutus Leas 09/29/2020, 12:10 PM

## 2020-09-29 NOTE — Progress Notes (Signed)
[place] PHYSICAL MEDICINE & REHABILITATION PROGRESS NOTE   Subjective/Complaints:   Passed modified, eating 30-100% meals ,  ROS- unable to assess due to aphasia   Objective:   DG Swallowing Func-Speech Pathology  Result Date: 09/28/2020 Objective Swallowing Evaluation: Type of Study: MBS-Modified Barium Swallow Study  Patient Details Name: Jason Romero MRN: [id number] Date of Birth: [birth year] Today's Date: 09/28/2020 Past Medical History: Past Medical History: Diagnosis Date . A-fib (HCC)  . Chronic headaches  . COPD (chronic obstructive pulmonary disease) (HCC)  . GERD (gastroesophageal reflux disease)  . History of blood transfusion  . Hypertension  . MI (myocardial infarction) (HCC)  . Seizures (HCC)  Past Surgical History: Past Surgical History: Procedure Laterality Date . CHOLECYSTECTOMY   HPI: The pt is a 49 yo male presenting with R-sided weakness and found to have intraparenchymal hematoma in the left basal ganglia on original CT. Repeat imaging revealed increased hemorrhage with 70mm midline shift. PMH includes: GERD, seizures, HTN, COPD, MI, afib, and current tobacco use. Admitted to CIR 09/20/20  Subjective: alert, not consistently following commands Assessment / Plan / Recommendation CHL IP CLINICAL IMPRESSIONS 09/28/2020 Clinical Impression -- Although oropharyngeal dysphagia still evident, pt's swallow function is improving in comparison to last MBSS 09/16/20. Pt's mastication of soft solids was mildly prolonged, however efficient; he demonstrated much greater oral control of boluses, and bolus cohesion of Dys 1 and Dys 3 textures. Aspiration X2 with delayed cough response and deep penetration noted during trials of thin barium, most likely due to pt's delayed initiation of swallow sequence, which occurred at the level of the pyriform sinuses. Penetration to the level of the vocal folds without ejection also noted with nectar. Due to receptive and expressive language and cognitive  deficits, pt was unable to follow any commands for compensatory strategies or maneuvers, therefore, not viable option at this time. Honey thick barium resulted in a slightly more timely swallow initiation, and reduction in depth and frequency of penetration into laryngeal vestibule (remained above vocal folds) and some was ejected during swallow). Given results of today's study, would recommend pt begin a Dys 3 (mechanical soft solid) texture diet, honey thick liquids, and medications whole in purees. He will require full supervision during meals to ensure use of swallow strategies and reduce impulsivity during intake. ST will continue to provide skilled interventions to work toward further advancement and increase independence with use of safe swallow strategies.  SLP Visit Diagnosis Dysphagia, oropharyngeal phase (R13.12);Aphasia (R47.01) Attention and concentration deficit following -- Frontal lobe and executive function deficit following -- Impact on safety and function Moderate aspiration risk   CHL IP TREATMENT RECOMMENDATION 09/16/2020 Treatment Recommendations Therapy as outlined in treatment plan below   Prognosis 09/16/2020 Prognosis for Safe Diet Advancement Good Barriers to Reach Goals Cognitive deficits;Language deficits Barriers/Prognosis Comment -- CHL IP DIET RECOMMENDATION 09/28/2020 SLP Diet Recommendations Honey thick liquids;Dysphagia 3 (Mech soft) solids Liquid Administration via Cup Medication Administration Whole meds with puree Compensations Minimize environmental distractions;Small sips/bites;Slow rate;Lingual sweep for clearance of pocketing Postural Changes Remain semi-upright after after feeds/meals (Comment)   CHL IP OTHER RECOMMENDATIONS 09/28/2020 Recommended Consults -- Oral Care Recommendations Oral care QID Other Recommendations Have oral suction available   CHL IP FOLLOW UP RECOMMENDATIONS 09/19/2020 Follow up Recommendations Inpatient Rehab   CHL IP FREQUENCY AND DURATION 09/16/2020  Speech Therapy Frequency (ACUTE ONLY) min 2x/week Treatment Duration 2 weeks      CHL IP ORAL PHASE 09/28/2020 Oral Phase Impaired Oral - Pudding Teaspoon --  Oral - Pudding Cup -- Oral - Honey Teaspoon NT Oral - Honey Cup Delayed oral transit;Lingual/palatal residue Oral - Nectar Teaspoon NT Oral - Nectar Cup Lingual/palatal residue Oral - Nectar Straw -- Oral - Thin Teaspoon NT Oral - Thin Cup Decreased bolus cohesion;Piecemeal swallowing Oral - Thin Straw -- Oral - Puree WFL Oral - Mech Soft Delayed oral transit Oral - Regular -- Oral - Multi-Consistency -- Oral - Pill -- Oral Phase - Comment --  CHL IP PHARYNGEAL PHASE 09/28/2020 Pharyngeal Phase Impaired Pharyngeal- Pudding Teaspoon -- Pharyngeal -- Pharyngeal- Pudding Cup -- Pharyngeal -- Pharyngeal- Honey Teaspoon NT Pharyngeal -- Pharyngeal- Honey Cup Delayed swallow initiation-vallecula;Delayed swallow initiation-pyriform sinuses;Reduced anterior laryngeal mobility;Penetration/Aspiration during swallow Pharyngeal Material enters airway, remains ABOVE vocal cords then ejected out;Material enters airway, remains ABOVE vocal cords and not ejected out Pharyngeal- Nectar Teaspoon NT Pharyngeal -- Pharyngeal- Nectar Cup Penetration/Aspiration during swallow;Delayed swallow initiation-pyriform sinuses;Reduced anterior laryngeal mobility Pharyngeal Material enters airway, CONTACTS cords and not ejected out Pharyngeal- Nectar Straw -- Pharyngeal -- Pharyngeal- Thin Teaspoon NT Pharyngeal -- Pharyngeal- Thin Cup Penetration/Aspiration during swallow;Reduced anterior laryngeal mobility;Delayed swallow initiation-pyriform sinuses Pharyngeal Material enters airway, passes BELOW cords and not ejected out despite cough attempt by patient Pharyngeal- Thin Straw -- Pharyngeal -- Pharyngeal- Puree Delayed swallow initiation-vallecula;Reduced anterior laryngeal mobility;Pharyngeal residue - valleculae Pharyngeal -- Pharyngeal- Mechanical Soft Delayed swallow  initiation-vallecula;Reduced anterior laryngeal mobility;Pharyngeal residue - valleculae Pharyngeal -- Pharyngeal- Regular -- Pharyngeal -- Pharyngeal- Multi-consistency -- Pharyngeal -- Pharyngeal- Pill -- Pharyngeal -- Pharyngeal Comment --  CHL IP CERVICAL ESOPHAGEAL PHASE 09/28/2020 Cervical Esophageal Phase WFL Pudding Teaspoon -- Pudding Cup -- Honey Teaspoon -- Honey Cup -- Nectar Teaspoon -- Nectar Cup -- Nectar Straw -- Thin Teaspoon -- Thin Cup -- Thin Straw -- Puree -- Mechanical Soft -- Regular -- Multi-consistency -- Pill -- Cervical Esophageal Comment -- [name] 09/28/2020, 9:56 AM              No results for input(s): WBC, HGB, HCT, PLT in the last 72 hours. No results for input(s): NA, K, CL, CO2, GLUCOSE, BUN, CREATININE, CALCIUM in the last 72 hours.  Intake/Output Summary (Last 24 hours) at 09/29/2020 0836 Last data filed at 09/29/2020 0800 Gross per 24 hour  Intake 600 ml  Output 350 ml  Net 250 ml        Physical Exam: Vital Signs Blood pressure (!) 129/95, pulse 95, temperature 97.9 F (36.6 C), resp. rate 18, height 6' 1.5" (1.867 m), weight 106.9 kg, SpO2 100 %.    General: No acute distress Mood and affect are appropriate Heart: Regular rate and rhythm no rubs murmurs or extra sounds Lungs: Clear to auscultation, breathing unlabored, no rales or wheezes Abdomen: Positive bowel sounds, soft nontender to palpation, nondistended Extremities: No clubbing, cyanosis, or edema  Skin: No evidence of breakdown, no evidence of rash Neurologic: Cranial nerves II through XII intact, motor strength is 5/5 inleft , 0/5 RIght  deltoid, bicep, tricep, grip, hip flexor, knee extensors, ankle dorsiflexor and plantar flexor Sensory exam absent pinch RUE and RLE  Musculoskeletal: no pain with range of motion in all 4 extremities. No joint swelling   Assessment/Plan: 1. Functional deficits which require 3+ hours per day of interdisciplinary therapy in a comprehensive  inpatient rehab setting.  Physiatrist is providing close team supervision and 24 hour management of active medical problems listed below.  Physiatrist and rehab team continue to assess barriers to discharge/monitor patient progress toward functional and medical goals  Care Tool:  Bathing    Body parts bathed by patient: Right arm, Chest, Abdomen, Front perineal area, Left upper leg, Face   Body parts bathed by helper: Left arm, Buttocks, Right upper leg, Right lower leg, Left lower leg     Bathing assist Assist Level: Moderate Assistance - Patient 50 - 74%     Upper Body Dressing/Undressing Upper body dressing   What is the patient wearing?: Pull over shirt    Upper body assist Assist Level: Moderate Assistance - Patient 50 - 74%    Lower Body Dressing/Undressing Lower body dressing      What is the patient wearing?: Pants     Lower body assist Assist for lower body dressing: 2 Helpers     Toileting Toileting Toileting Activity did not occur (Clothing management and hygiene only):  (2 assit stedy)  Toileting assist Assist for toileting: Dependent - Patient 0%     Transfers Chair/bed transfer  Transfers assist     Chair/bed transfer assist level: Moderate Assistance - Patient 50 - 74%     Locomotion Ambulation   Ambulation assist   Ambulation activity did not occur: Safety/medical concerns  Assist level: Maximal Assistance - Patient 25 - 49% Assistive device: Lite Gait Max distance: 1611ft   Walk 10 feet activity   Assist  Walk 10 feet activity did not occur: Safety/medical concerns  Assist level: Maximal Assistance - Patient 25 - 49% Assistive device: Lite Gait   Walk 50 feet activity   Assist Walk 50 feet with 2 turns activity did not occur: Safety/medical concerns         Walk 150 feet activity   Assist Walk 150 feet activity did not occur: Safety/medical concerns         Walk 10 feet on uneven surface  activity   Assist Walk  10 feet on uneven surfaces activity did not occur: Safety/medical concerns         Wheelchair     Assist Will patient use wheelchair at discharge?: Yes Type of Wheelchair: Manual    Wheelchair assist level: Dependent - Patient 0% Max wheelchair distance: 150    Wheelchair 50 feet with 2 turns activity    Assist        Assist Level: Dependent - Patient 0%   Wheelchair 150 feet activity     Assist      Assist Level: Dependent - Patient 0%   Blood pressure (!) 129/95, pulse 95, temperature 97.9 F (36.6 C), resp. rate 18, height 6' 1.5" (1.867 m), weight 106.9 kg, SpO2 100 %.    Medical Problem List and Plan: 1.  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  Antithrombotics: -DVT/anticoagulation: SCDs             -antiplatelet therapy: N/A 3. Pain Management: Tylenol as needed. Does not appear to be in pain.   11/13- pt denies pain by shaking head no- multiple times- con't tylenol prn 4. Mood: Provide emotional support             -antipsychotic agents: N/A 5. Neuropsych: This patient is not capable of making decisions on his own behalf. 6. Skin/Wound Care: Routine skin checks 7. Fluids/Electrolytes/Nutrition: Routine  in and outs with follow-up chemistries 8.  Dysphagia.  NPO.  Alternative means of nutritional support.  MBS- ok for D3, honey thick May d/c cortrak if pt taking >50% 9.  Hypertension.  Norvasc 10 mg daily, hydralazine 100 mg every 8 hours, Normodyne 300 mg every 8 hours, lisinopril 20 mg twice daily.  Monitor with increased mobility Vitals:   09/29/20 0442 09/29/20 0623  BP: 119/72 (!) 129/95  Pulse: 85 95  Resp:    Temp: 97.9 F (36.6 C)   SpO2: 100%    Controlled 11/18 10.  History of tobacco abuse as well as marijuana use.Also amphetamine use  Urine  drug screen positive marijuana.  Provide counseling 11.  Obesity.  BMI 30.67-->31.36-->31.02  Dietary follow-up  12. Incontinence of bowel and bladder  Trouble using call bell also is aphasic, cont efforts at timed toileting       LOS: 9 days A FACE TO FACE EVALUATION WAS PERFORMED  Erick Colace 09/29/2020, 8:36 AM

## 2020-09-29 NOTE — Progress Notes (Signed)
Calorie Count Note  24-hour calorie count ordered. Calorie count started at lunch meal on 09/28/20.  Diet: dysphagia 3, honey-thick liquids Supplements: - Magic Cup TID with meals - Double protein portions  Results: 11/17 Lunch: 301 kcal, 7 grams of protein 11/17 Dinner: 672 kcal, 23 grams of protein 11/18 Breakfast: 185 kcal, 5 grams of protein  Total 24-hour intake: 1158 kcal (50% of minimum estimated needs)  35 grams of protein (30% of minimum estimated needs)  Nutrition Diagnosis:  Inadequate oral intake related to dysphagia as evidenced by NPO status. - Progressing  Goal: Patient will meet greater than or equal to 90% of their needs  Intervention: - Based on results of 24-hour calorie count, recommend continuing nocturnal tube feeds of Osmolite 1.5 @ 80 ml/hr for 12 hours from 1800 to 0600 and ProSource TF 45 ml TID - d/c 24-hour calorie count - Continue Magic Cup TID - Continue double protein portions TID   Mertie Clause, MS, RD, LDN Inpatient Clinical Dietitian Please see [facility] for contact information.

## 2020-09-30 ENCOUNTER — Inpatient Hospital Stay (HOSPITAL_COMMUNITY): Payer: Self-pay | Admitting: Physical Therapy

## 2020-09-30 ENCOUNTER — Inpatient Hospital Stay (HOSPITAL_COMMUNITY): Payer: Self-pay | Admitting: Occupational Therapy

## 2020-09-30 ENCOUNTER — Inpatient Hospital Stay (HOSPITAL_COMMUNITY): Payer: Self-pay | Admitting: Speech Pathology

## 2020-09-30 LAB — GLUCOSE, CAPILLARY
Glucose-Capillary: 113 mg/dL — ABNORMAL HIGH (ref 70–99)
Glucose-Capillary: 95 mg/dL (ref 70–99)
Glucose-Capillary: 95 mg/dL (ref 70–99)

## 2020-09-30 NOTE — Progress Notes (Signed)
Occupational Therapy Session Note  Patient Details  Name: Jason Romero MRN: [id number] Date of Birth: [birth year]  Today's Date: 09/30/2020 OT Individual Time: 1300-1330 OT Individual Time Calculation (min): 30 min   Skilled Therapeutic Interventions/Progress Updates:    Pt greeted in bed with no c/o pain, noted that pt was leaning onto his Rt elbow with Rt trunk lean. Kinesiotape applied to his Rt shoulder for sublux management. RT assisted OT with positioning of the Rt UE while tape was applied. Explained to both pt and spouse [name] e-stim benefits and both agreeable to try. Pt tolerated ~12 minutes of saebo stim one applied to anterior/posterior deltoids for approximation of the shoulder joint. Skin intact once the pads were removed. While pt was tolerating e-stim, discussed with pt and spouse positioning strategies for sublux management and also worked on Musician for expressing self care needs. At end of tx pt remained in bed, left with all needs within reach and bed alarm set.   Saebo Stim One 330 pulse width 35 Hz pulse rate On 8 sec/ off 8 sec Ramp up/ down 2 sec Symmetrical Biphasic wave form  Max intensity at 500 Ohm load   Therapy Documentation Precautions:  Precautions Precautions: Fall Precaution Comments: R hemiplegia, R inattention Restrictions Weight Bearing Restrictions: No Vital Signs: Therapy Vitals Temp: (!) 97.5 F (36.4 C) Temp Source: Oral Pulse Rate: 77 Resp: 14 BP: 124/81 Patient Position (if appropriate): Lying Oxygen Therapy SpO2: 99 % O2 Device: Room Air Pain: pt denied pain during session   ADL: ADL Eating: NPO (CorTrak) Grooming: Minimal assistance Where Assessed-Grooming: Bed level Upper Body Bathing: Moderate assistance, Maximal cueing Where Assessed-Upper Body Bathing: Edge of bed Lower Body Bathing: Moderate assistance Where Assessed-Lower Body Bathing: Bed level Upper Body Dressing:  Moderate assistance Where Assessed-Upper Body Dressing: Edge of bed Lower Body Dressing: Maximal assistance, Maximal cueing Where Assessed-Lower Body Dressing: Bed level Toileting: Dependent (Incontinent x2) Where Assessed-Toileting: Bed level Toilet Transfer: Not assessed Tub/Shower Transfer: Not assessed     Therapy/Group: Individual Therapy  Tykeria Wawrzyniak A Salimah Martinovich 09/30/2020, 4:25 PM

## 2020-09-30 NOTE — Progress Notes (Signed)
Occupational Therapy Session Note  Patient Details  Name: Jason Romero MRN: [id number] Date of Birth: 05-Aug-1971  Today's Date: 09/30/2020 OT Individual Time: 0945-1100 OT Individual Time Calculation (min): 75 min    Short Term Goals: Week 2:  OT Short Term Goal 1 (Week 2): Patient will maintain static sitting balance at EOB with Min A in prep for ADLs. OT Short Term Goal 2 (Week 2): Patient complete 1/3 parts of LB dressing task seated EOB without lateral LOB. OT Short Term Goal 3 (Week 2): Patient will complete 2/3 parts of UB dressing task with use of hemi techinque. OT Short Term Goal 4 (Week 2): Patient will complete squat-pivot transfers in 2 consecutive treatment sessions with Mod A and no more than 3 cues for safety awareness.  Skilled Therapeutic Interventions/Progress Updates:  Patient met lying supine in bed in agreement with OT treatment session. No facial grimacing at rest or with activity. OT noted increased subluxation of R shoulder of 2 breaths compared to 1 breath upon initial eval. RN notified. Patient found with soiled brief. Total A for hygiene/clothing management. Supine to EOB with HOB elevated and assist to advance RLE toward EOB and elevate trunk. Mod to Max A for anterior scoot toward EOB. +2 assist for squat-pivot transfer to roll-in shower chair on R with use of chuck pad. Total A for transfer to walk-in shower. Seated in roll-in shower chair patient completed UB bathing with Mod A and LB bathing with Max A. Return to supine via maxi move for patient/therapist safety without +2 assist. Patient with multiple loose BM throughout tx session. RN notified. Rolling R<>L in supine with Mod A to R and +2 assist to L. Session concluded with patient lying supine in bed with call bell within reach, bed alarm activated, and all needs met.   Therapy Documentation Precautions:  Precautions Precautions: Fall Precaution Comments: R hemiplegia, R inattention Restrictions Weight  Bearing Restrictions: No General:    Therapy/Group: Individual Therapy  Terrell Shimko R [place]-Davis 09/30/2020, 7:31 AM

## 2020-09-30 NOTE — Progress Notes (Signed)
Speech Language Pathology Daily Session Note  Patient Details  Name: Jason Romero MRN: [id number] Date of Birth: [birth year]  Today's Date: 09/30/2020 SLP Individual Time: 8502-7741 SLP Individual Time Calculation (min): 55 min  Short Term Goals: Week 2: SLP Short Term Goal 1 (Week 2): Pt will consume current dysphagia 3 texture diet and honey thick liquids with Min A verbal and visual cueing for use of compensatory stategies and minimal overt s/sx aspiration. SLP Short Term Goal 2 (Week 2): Pt will consume therapeutic trials of thin H2O with SLP with minimal overt s/sx aspiration and no appreciable change in daily vitals across 1 week to demonstrate readiness repeat insturmental testing. SLP Short Term Goal 3 (Week 2): Pt will repeat at the word level with 75% accuracy provided Max A multimodal cues. SLP Short Term Goal 4 (Week 2): Pt will produce basic CV words with Max A multimodal cues. SLP Short Term Goal 5 (Week 2): Pt will demonstrate ability to use gestures and/or a communication board to communication basic wants and needs with Min A multimodal cues. SLP Short Term Goal 6 (Week 2): Pt will detect verbal and/or functional errors with Mod A multimodal cues.  Skilled Therapeutic Interventions: Pt was seen for skilled ST targeting dysphagia and communication goals. SLP provided skilled observation of pt consuming honey thick liquids to assess tolerance and independence with swallowing strategies. He consumed a total of 4 oz without overt s/sx aspiration despite some large sips. Overall Min A verbal cues required to reduce impulsivity with intake and maintain neutral head positioning. Recommend continue current diet. SLP attempted to initiate EMST exercises targeting pharyngeal muscle strengthening, however due to motor deficits, unable to blow out (but did suck in). Pt able to achieve lip rounded around EMST device and also to blow a paper towel to ~30% angle. Will continue to attempt EMST  use in future sessions. Communication interventions focused on verbalizing functional words and phrases, automatic speech, and writing. Pt produced sentences "I need to go to the bathroom" and "I want to eat" with Max a phonemic and written cues. Unfortunately, written cues unable to fade out at this time, but pt is demonstrating good progress. He also counted 1-10 with only Min A phonemic and visual (finger counting) cues. Pt wrote ~60% of the letters of the alphabet with to dictation, remaining letters required copy cues, however he did produce all letters successfully. Also wrote his first and last name with dictation cues to correct 2 letters. Given pt's fall yesterday, reinforced importance of use of call bell (which he demonstrated ability to use) - ensured pt that even though he may not be able to state what he needs, staff is aware that when he calls they are to go into his room to identify needs and therefore call bell can still be of use to him when he needs assistance. He nodded his head in understanding.  Pt left laying in bed with alarm set and needs within reach. Continue per current plan of care.       Pain Pain Assessment Pain Scale: 0-10 Pain Score: 0-No pain  Therapy/Group: Individual Therapy  [name] 09/30/2020, 11:50 AM

## 2020-09-30 NOTE — Progress Notes (Deleted)
Pt refusing to allow initiation of chair alarm, educated on fall risks, pt continues to decline. Pt is alert and oriented x4.

## 2020-09-30 NOTE — Progress Notes (Deleted)
Pt reports nausea and while this nurse obtaining medication, pt has a a moderate thin, light yellow colored emesis. Vital signs stable, compazine 10mg  administered IM-pt reports relief. Will continue to monitor.

## 2020-09-30 NOTE — Progress Notes (Signed)
Pt slept well throughout the night. Denies pain or discomfort. No impulsive behavior noted this shift.

## 2020-09-30 NOTE — Progress Notes (Signed)
[place] PHYSICAL MEDICINE & REHABILITATION PROGRESS NOTE   Subjective/Complaints: Working with SLP, resp muscle strengthening  Taking 50-70% meals ROS- unable to assess due to aphasia   Objective:   DG Swallowing Func-Speech Pathology  Result Date: 09/28/2020 Objective Swallowing Evaluation: Type of Study: MBS-Modified Barium Swallow Study  Patient Details Name: Jason Romero MRN: [id number] Date of Birth: 1971-02-14 Today's Date: 09/28/2020 Past Medical History: Past Medical History: Diagnosis Date . A-fib (HCC)  . Chronic headaches  . COPD (chronic obstructive pulmonary disease) (HCC)  . GERD (gastroesophageal reflux disease)  . History of blood transfusion  . Hypertension  . MI (myocardial infarction) (HCC)  . Seizures (HCC)  Past Surgical History: Past Surgical History: Procedure Laterality Date . CHOLECYSTECTOMY   HPI: The pt is a 49 yo male presenting with R-sided weakness and found to have intraparenchymal hematoma in the left basal ganglia on original CT. Repeat imaging revealed increased hemorrhage with 42mm midline shift. PMH includes: GERD, seizures, HTN, COPD, MI, afib, and current tobacco use. Admitted to CIR 09/20/20  Subjective: alert, not consistently following commands Assessment / Plan / Recommendation CHL IP CLINICAL IMPRESSIONS 09/28/2020 Clinical Impression -- Although oropharyngeal dysphagia still evident, pt's swallow function is improving in comparison to last MBSS 09/16/20. Pt's mastication of soft solids was mildly prolonged, however efficient; he demonstrated much greater oral control of boluses, and bolus cohesion of Dys 1 and Dys 3 textures. Aspiration X2 with delayed cough response and deep penetration noted during trials of thin barium, most likely due to pt's delayed initiation of swallow sequence, which occurred at the level of the pyriform sinuses. Penetration to the level of the vocal folds without ejection also noted with nectar. Due to receptive and expressive  language and cognitive deficits, pt was unable to follow any commands for compensatory strategies or maneuvers, therefore, not viable option at this time. Honey thick barium resulted in a slightly more timely swallow initiation, and reduction in depth and frequency of penetration into laryngeal vestibule (remained above vocal folds) and some was ejected during swallow). Given results of today's study, would recommend pt begin a Dys 3 (mechanical soft solid) texture diet, honey thick liquids, and medications whole in purees. He will require full supervision during meals to ensure use of swallow strategies and reduce impulsivity during intake. ST will continue to provide skilled interventions to work toward further advancement and increase independence with use of safe swallow strategies.  SLP Visit Diagnosis Dysphagia, oropharyngeal phase (R13.12);Aphasia (R47.01) Attention and concentration deficit following -- Frontal lobe and executive function deficit following -- Impact on safety and function Moderate aspiration risk   CHL IP TREATMENT RECOMMENDATION 09/16/2020 Treatment Recommendations Therapy as outlined in treatment plan below   Prognosis 09/16/2020 Prognosis for Safe Diet Advancement Good Barriers to Reach Goals Cognitive deficits;Language deficits Barriers/Prognosis Comment -- CHL IP DIET RECOMMENDATION 09/28/2020 SLP Diet Recommendations Honey thick liquids;Dysphagia 3 (Mech soft) solids Liquid Administration via Cup Medication Administration Whole meds with puree Compensations Minimize environmental distractions;Small sips/bites;Slow rate;Lingual sweep for clearance of pocketing Postural Changes Remain semi-upright after after feeds/meals (Comment)   CHL IP OTHER RECOMMENDATIONS 09/28/2020 Recommended Consults -- Oral Care Recommendations Oral care QID Other Recommendations Have oral suction available   CHL IP FOLLOW UP RECOMMENDATIONS 09/19/2020 Follow up Recommendations Inpatient Rehab   CHL IP FREQUENCY  AND DURATION 09/16/2020 Speech Therapy Frequency (ACUTE ONLY) min 2x/week Treatment Duration 2 weeks      CHL IP ORAL PHASE 09/28/2020 Oral Phase Impaired Oral - Pudding Teaspoon --  Oral - Pudding Cup -- Oral - Honey Teaspoon NT Oral - Honey Cup Delayed oral transit;Lingual/palatal residue Oral - Nectar Teaspoon NT Oral - Nectar Cup Lingual/palatal residue Oral - Nectar Straw -- Oral - Thin Teaspoon NT Oral - Thin Cup Decreased bolus cohesion;Piecemeal swallowing Oral - Thin Straw -- Oral - Puree WFL Oral - Mech Soft Delayed oral transit Oral - Regular -- Oral - Multi-Consistency -- Oral - Pill -- Oral Phase - Comment --  CHL IP PHARYNGEAL PHASE 09/28/2020 Pharyngeal Phase Impaired Pharyngeal- Pudding Teaspoon -- Pharyngeal -- Pharyngeal- Pudding Cup -- Pharyngeal -- Pharyngeal- Honey Teaspoon NT Pharyngeal -- Pharyngeal- Honey Cup Delayed swallow initiation-vallecula;Delayed swallow initiation-pyriform sinuses;Reduced anterior laryngeal mobility;Penetration/Aspiration during swallow Pharyngeal Material enters airway, remains ABOVE vocal cords then ejected out;Material enters airway, remains ABOVE vocal cords and not ejected out Pharyngeal- Nectar Teaspoon NT Pharyngeal -- Pharyngeal- Nectar Cup Penetration/Aspiration during swallow;Delayed swallow initiation-pyriform sinuses;Reduced anterior laryngeal mobility Pharyngeal Material enters airway, CONTACTS cords and not ejected out Pharyngeal- Nectar Straw -- Pharyngeal -- Pharyngeal- Thin Teaspoon NT Pharyngeal -- Pharyngeal- Thin Cup Penetration/Aspiration during swallow;Reduced anterior laryngeal mobility;Delayed swallow initiation-pyriform sinuses Pharyngeal Material enters airway, passes BELOW cords and not ejected out despite cough attempt by patient Pharyngeal- Thin Straw -- Pharyngeal -- Pharyngeal- Puree Delayed swallow initiation-vallecula;Reduced anterior laryngeal mobility;Pharyngeal residue - valleculae Pharyngeal -- Pharyngeal- Mechanical Soft Delayed  swallow initiation-vallecula;Reduced anterior laryngeal mobility;Pharyngeal residue - valleculae Pharyngeal -- Pharyngeal- Regular -- Pharyngeal -- Pharyngeal- Multi-consistency -- Pharyngeal -- Pharyngeal- Pill -- Pharyngeal -- Pharyngeal Comment --  CHL IP CERVICAL ESOPHAGEAL PHASE 09/28/2020 Cervical Esophageal Phase WFL Pudding Teaspoon -- Pudding Cup -- Honey Teaspoon -- Honey Cup -- Nectar Teaspoon -- Nectar Cup -- Nectar Straw -- Thin Teaspoon -- Thin Cup -- Thin Straw -- Puree -- Mechanical Soft -- Regular -- Multi-consistency -- Pill -- Cervical Esophageal Comment -- Jason Romero 09/28/2020, 9:56 AM              No results for input(s): WBC, HGB, HCT, PLT in the last 72 hours. No results for input(s): NA, K, CL, CO2, GLUCOSE, BUN, CREATININE, CALCIUM in the last 72 hours.  Intake/Output Summary (Last 24 hours) at 09/30/2020 0808 Last data filed at 09/29/2020 2040 Gross per 24 hour  Intake 735 ml  Output 300 ml  Net 435 ml        Physical Exam: Vital Signs Blood pressure (!) 132/94, pulse 96, temperature 97.9 F (36.6 C), temperature source Oral, resp. rate 20, height 6' 1.5" (1.867 m), weight 106.6 kg, SpO2 96 %.   General: No acute distress Mood and affect are appropriate Heart: Regular rate and rhythm no rubs murmurs or extra sounds Lungs: Clear to auscultation, breathing unlabored, no rales or wheezes Abdomen: Positive bowel sounds, soft nontender to palpation, nondistended Extremities: No clubbing, cyanosis, or edema Skin: No evidence of breakdown, no evidence of rash    Skin: No evidence of breakdown, no evidence of rash Neurologic: Cranial nerves II through XII intact, motor strength is 5/5 inleft , 0/5 RIght  deltoid, bicep, tricep, grip, hip flexor, ankle dorsiflexor and plantar flexor 3- RIght knee ext! Sensory exam absent pinch RUE and RLE  Musculoskeletal: no pain with range of motion in all 4 extremities. No joint swelling   Assessment/Plan: 1. Functional  deficits which require 3+ hours per day of interdisciplinary therapy in a comprehensive inpatient rehab setting.  Physiatrist is providing close team supervision and 24 hour management of active medical problems listed below.  Physiatrist and  rehab team continue to assess barriers to discharge/monitor patient progress toward functional and medical goals  Care Tool:  Bathing    Body parts bathed by patient: Right arm, Chest, Abdomen, Front perineal area, Left upper leg, Face   Body parts bathed by helper: Left arm, Buttocks, Right upper leg, Right lower leg, Left lower leg     Bathing assist Assist Level: Moderate Assistance - Patient 50 - 74%     Upper Body Dressing/Undressing Upper body dressing   What is the patient wearing?: Pull over shirt    Upper body assist Assist Level: Moderate Assistance - Patient 50 - 74%    Lower Body Dressing/Undressing Lower body dressing      What is the patient wearing?: Pants     Lower body assist Assist for lower body dressing: 2 Helpers     Toileting Toileting Toileting Activity did not occur (Clothing management and hygiene only):  (2 assit stedy)  Toileting assist Assist for toileting: Dependent - Patient 0%     Transfers Chair/bed transfer  Transfers assist     Chair/bed transfer assist level: Moderate Assistance - Patient 50 - 74%     Locomotion Ambulation   Ambulation assist   Ambulation activity did not occur: Safety/medical concerns  Assist level: Maximal Assistance - Patient 25 - 49% Assistive device: Lite Gait Max distance: 24ft   Walk 10 feet activity   Assist  Walk 10 feet activity did not occur: Safety/medical concerns  Assist level: Maximal Assistance - Patient 25 - 49% Assistive device: Lite Gait   Walk 50 feet activity   Assist Walk 50 feet with 2 turns activity did not occur: Safety/medical concerns         Walk 150 feet activity   Assist Walk 150 feet activity did not occur:  Safety/medical concerns         Walk 10 feet on uneven surface  activity   Assist Walk 10 feet on uneven surfaces activity did not occur: Safety/medical concerns         Wheelchair     Assist Will patient use wheelchair at discharge?: Yes Type of Wheelchair: Manual    Wheelchair assist level: Dependent - Patient 0% Max wheelchair distance: 150    Wheelchair 50 feet with 2 turns activity    Assist        Assist Level: Dependent - Patient 0%   Wheelchair 150 feet activity     Assist      Assist Level: Dependent - Patient 0%   Blood pressure (!) 132/94, pulse 96, temperature 97.9 F (36.6 C), temperature source Oral, resp. rate 20, height 6' 1.5" (1.867 m), weight 106.6 kg, SpO2 96 %.    Medical Problem List and Plan: 1.  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  Antithrombotics: -DVT/anticoagulation: SCDs             -antiplatelet therapy: N/A 3. Pain Management: Tylenol as needed. Does not appear to be in pain.   11/13- pt denies pain by shaking head no- multiple times- con't tylenol prn 4. Mood: Provide emotional support             -antipsychotic agents: N/A 5.  Neuropsych: This patient is not capable of making decisions on his own behalf. 6. Skin/Wound Care: Routine skin checks 7. Fluids/Electrolytes/Nutrition: Routine in and outs with follow-up chemistries 8.  Dysphagia.  NPO.  Alternative means of nutritional support.  MBS- ok for D3, honey thick TF, cortrak d/ced 9.  Hypertension.  Norvasc 10 mg daily, hydralazine 100 mg every 8 hours, Normodyne 300 mg every 8 hours, lisinopril 20 mg twice daily.  Monitor with increased mobility Vitals:   09/29/20 2028 09/30/20 0548  BP: 115/82 (!) 132/94  Pulse: 90 96  Resp: 20 20  Temp:  98.2 F (36.8 C) 97.9 F (36.6 C)  SpO2: 96% 96%   Controlled 11/19 10.  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  Provide counseling 11.  Obesity.  BMI 30.67-->31.36-->31.02  Dietary follow-up  12. Incontinence of bowel and bladder  Trouble using call bell also is aphasic, cont efforts at timed toileting       LOS: 10 days A FACE TO FACE EVALUATION WAS PERFORMED  Erick Colacendrew E Jahmari Esbenshade 09/30/2020, 8:08 AM

## 2020-10-01 LAB — GLUCOSE, CAPILLARY: Glucose-Capillary: 97 mg/dL (ref 70–99)

## 2020-10-01 MED ORDER — HYDRALAZINE HCL 25 MG PO TABS
25.0000 mg | ORAL_TABLET | Freq: Three times a day (TID) | ORAL | Status: DC
  Administered 2020-10-01 – 2020-10-04 (×8): 25 mg via ORAL
  Filled 2020-10-01 (×8): qty 1

## 2020-10-01 NOTE — Progress Notes (Signed)
[name] PHYSICAL MEDICINE & REHABILITATION PROGRESS NOTE   Subjective/Complaints: In room no therapy today. ROS- unable to assess due to aphasia   Objective:   No results found. No results for input(s): WBC, HGB, HCT, PLT in the last 72 hours. No results for input(s): NA, K, CL, CO2, GLUCOSE, BUN, CREATININE, CALCIUM in the last 72 hours. No intake or output data in the 24 hours ending 10/01/20 1347      Physical Exam: Vital Signs Blood pressure 118/83, pulse 84, temperature 97.6 F (36.4 C), resp. rate 15, height 6' 1.5" (1.867 m), weight 106.6 kg, SpO2 94 %.   General: No acute distress Mood and affect are appropriate Heart: Regular rate and rhythm no rubs murmurs or extra sounds Lungs: Clear to auscultation, breathing unlabored, no rales or wheezes Abdomen: Positive bowel sounds, soft nontender to palpation, nondistended Extremities: No clubbing, cyanosis, or edema Skin: No evidence of breakdown, no evidence of rash Neurologic: Cranial nerves II through XII intact, motor strength is 5/5 inleft , 0/5 RIght  deltoid, bicep, tricep, grip, hip flexor, ankle dorsiflexor and plantar flexor 3- RIght knee ext! Sensory exam absent pinch RUE and RLE  Musculoskeletal: no pain with range of motion in all 4 extremities. No joint swelling   Assessment/Plan: 1. Functional deficits which require 3+ hours per day of interdisciplinary therapy in a comprehensive inpatient rehab setting.  Physiatrist is providing close team supervision and 24 hour management of active medical problems listed below.  Physiatrist and rehab team continue to assess barriers to discharge/monitor patient progress toward functional and medical goals  Care Tool:  Bathing    Body parts bathed by patient: Right arm, Chest, Abdomen, Front perineal area, Left upper leg, Face   Body parts bathed by helper: Left arm, Buttocks, Right upper leg, Right lower leg, Left lower leg     Bathing assist Assist Level:  Moderate Assistance - Patient 50 - 74%     Upper Body Dressing/Undressing Upper body dressing   What is the patient wearing?: Pull over shirt    Upper body assist Assist Level: Moderate Assistance - Patient 50 - 74%    Lower Body Dressing/Undressing Lower body dressing      What is the patient wearing?: Incontinence brief     Lower body assist Assist for lower body dressing: 2 Helpers     Toileting Toileting Toileting Activity did not occur (Clothing management and hygiene only):  (2 assit stedy)  Toileting assist Assist for toileting: Dependent - Patient 0%     Transfers Chair/bed transfer  Transfers assist     Chair/bed transfer assist level: Moderate Assistance - Patient 50 - 74%     Locomotion Ambulation   Ambulation assist   Ambulation activity did not occur: Safety/medical concerns  Assist level: Maximal Assistance - Patient 25 - 49% Assistive device: Lite Gait Max distance: 1ft   Walk 10 feet activity   Assist  Walk 10 feet activity did not occur: Safety/medical concerns  Assist level: Maximal Assistance - Patient 25 - 49% Assistive device: Lite Gait   Walk 50 feet activity   Assist Walk 50 feet with 2 turns activity did not occur: Safety/medical concerns         Walk 150 feet activity   Assist Walk 150 feet activity did not occur: Safety/medical concerns         Walk 10 feet on uneven surface  activity   Assist Walk 10 feet on uneven surfaces activity did not occur: Safety/medical concerns  Wheelchair     Assist Will patient use wheelchair at discharge?: Yes Type of Wheelchair: Manual    Wheelchair assist level: Dependent - Patient 0% Max wheelchair distance: 150    Wheelchair 50 feet with 2 turns activity    Assist        Assist Level: Dependent - Patient 0%   Wheelchair 150 feet activity     Assist      Assist Level: Dependent - Patient 0%   Blood pressure 118/83, pulse 84,  temperature 97.6 F (36.4 C), resp. rate 15, height 6' 1.5" (1.867 m), weight 106.6 kg, SpO2 94 %.    Medical Problem List and Plan: 1.  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  Antithrombotics: -DVT/anticoagulation: SCDs             -antiplatelet therapy: N/A 3. Pain Management: Tylenol as needed. Does not appear to be in pain.   11/13- pt denies pain by shaking head no- multiple times- con't tylenol prn 4. Mood: Provide emotional support             -antipsychotic agents: N/A 5. Neuropsych: This patient is not capable of making decisions on his own behalf. 6. Skin/Wound Care: Routine skin checks 7. Fluids/Electrolytes/Nutrition: Routine in and outs with follow-up chemistries 8.  Dysphagia.  NPO.  Alternative means of nutritional support.  MBS- ok for D3, honey thick TF, cortrak d/ced 9.  Hypertension.  Norvasc 10 mg daily, hydralazine 100 mg every 8 hours, Normodyne 300 mg every 8 hours, lisinopril 20 mg twice daily.  Monitor with increased mobility Vitals:   10/01/20 0528 10/01/20 0851  BP: 110/84 118/83  Pulse: 84   Resp: 15   Temp: 97.6 F (36.4 C)   SpO2: 94%    Controlled 11/19, may reduce hydralazine to 25 mg 10.  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  Provide counseling 11.  Obesity.  BMI 30.67-->31.36-->31.02  Dietary follow-up  12. Incontinence of bowel and bladder  Trouble using call bell also is aphasic, cont efforts at timed toileting       LOS: 11 days A FACE TO FACE EVALUATION WAS PERFORMED  Jason Romero 10/01/2020, 1:47 PM

## 2020-10-01 NOTE — Progress Notes (Signed)
Physical Therapy Session Note  Patient Details  Name: Jason Romero MRN: [id number] Date of Birth: [birth year]  Today's Date: 09/30/2020 PT Individual Time: 1550-1630   40 min   Short Term Goals: Week 2:  PT Short Term Goal 1 (Week 2): Pt will perform bed mobility with mod A PT Short Term Goal 2 (Week 2): Pt will propel WC 14f with min using hemi technique PT Short Term Goal 3 (Week 2): Pt will perform squat pivot transfer with mod assist to his R PT Short Term Goal 4 (Week 2): Pt will perform sit<>stand from bed with mod assist  Skilled Therapeutic Interventions/Progress Updates:   Pt received supine in bed and initially declining PT. PT obtained WC to attempt WC mobility. Once returned, pt reports need to urinate. Supine>sit EOB with mod-max assist from PT to control the RLE and RUE. Sitting balance EOB with min assist and min cues for awareness of lateral LOB to the R. Noted to have been incontinent of bladder prior to transfer.   Squat [facility]-max assist to block the RLE. Mod-max assist to sit<>[facility]. Unable to void once sitting on BSC.  Stand pivot transfer back to bed with stand pivot technique and BUE supported on PT; max assist due to RLE buckling.  Sit>supine completed with mod assist for RLE/RUE control, and left supine in bed with call bell in reach and all needs met.        Therapy Documentation Precautions:  Precautions Precautions: Fall Precaution Comments: R hemiplegia, R inattention Restrictions Weight Bearing Restrictions: No    Vital Signs: Therapy Vitals BP: 118/83 Pain: deines   Therapy/Group: Individual Therapy  ALorie Phenix11/20/2021, 10:39 AM

## 2020-10-02 ENCOUNTER — Inpatient Hospital Stay (HOSPITAL_COMMUNITY): Payer: Self-pay

## 2020-10-02 ENCOUNTER — Inpatient Hospital Stay (HOSPITAL_COMMUNITY): Payer: Self-pay | Admitting: Speech Pathology

## 2020-10-02 NOTE — Progress Notes (Signed)
Speech Language Pathology Daily Session Note  Patient Details  Name: Jason Romero MRN: [id number] Date of Birth: 07-29-1971  Today's Date: 10/02/2020 SLP Individual Time: 1330-1415 SLP Individual Time Calculation (min): 45 min  Short Term Goals: Week 2: SLP Short Term Goal 1 (Week 2): Pt will consume current dysphagia 3 texture diet and honey thick liquids with Min A verbal and visual cueing for use of compensatory stategies and minimal overt s/sx aspiration. SLP Short Term Goal 2 (Week 2): Pt will consume therapeutic trials of thin H2O with SLP with minimal overt s/sx aspiration and no appreciable change in daily vitals across 1 week to demonstrate readiness repeat insturmental testing. SLP Short Term Goal 3 (Week 2): Pt will repeat at the word level with 75% accuracy provided Max A multimodal cues. SLP Short Term Goal 4 (Week 2): Pt will produce basic CV words with Max A multimodal cues. SLP Short Term Goal 5 (Week 2): Pt will demonstrate ability to use gestures and/or a communication board to communication basic wants and needs with Min A multimodal cues. SLP Short Term Goal 6 (Week 2): Pt will detect verbal and/or functional errors with Mod A multimodal cues.  Skilled Therapeutic Interventions: Pt was seen for skilled ST targeting dysphagia and communication goals. Pt's wife was present at bedside for session. SLP facilitated session with upgraded trial of regular texture solids. He exhibited an efficient mastication and oral transit pattern, however min right buccal pocketing noted and required Min A verbal cueing for use of strategies for clearance. Use of liquid washes was more effective than lingual sweep, given pt's oral motor limitations. No overt s/sx aspiration noted across regular or honey thick liquid textures. Recommend continue current diet. Pt was very verbose and attempting to communicate several messages about pain, use of call bell, and motivation to work in therapies. Overall  increase in accurate words and phrases noted today, although verbal communication is still very effortful. He is improving in his ability to self-monitor frustration level and use accompanying gestures to aid in listener's understanding of messages. With Moderate verbal question cueing and extra time, SLP was able to decipher pt's message about sensation in right leg and having pain - RN made aware and administered pain medication. Pt left laying in bed with alarm set and needs within reach, wife still present. Continue per current plan of care.          Pain Pain Assessment Pain Scale: Faces Faces Pain Scale: Hurts a little bit Pain Type: Acute pain Pain Location: Leg Pain Orientation: Right Pain Descriptors / Indicators: Discomfort Pain Onset: Gradual Pain Intervention(s): RN made aware Multiple Pain Sites: No  Therapy/Group: Individual Therapy  [name] 10/02/2020, 7:43 AM

## 2020-10-02 NOTE — Progress Notes (Signed)
Occupational Therapy Session Note  Patient Details  Name: Jason Romero MRN: [id number] Date of Birth: Feb 14, 1971  Today's Date: 10/02/2020 OT Individual Time: 1000-1030 OT Individual Time Calculation (min): 30 min  and Today's Date: 10/02/2020 OT Missed Time: 45 Minutes Missed Time Reason: Patient unwilling/refused to participate without medical reason   Short Term Goals: Week 2:  OT Short Term Goal 1 (Week 2): Patient will maintain static sitting balance at EOB with Min A in prep for ADLs. OT Short Term Goal 2 (Week 2): Patient complete 1/3 parts of LB dressing task seated EOB without lateral LOB. OT Short Term Goal 3 (Week 2): Patient will complete 2/3 parts of UB dressing task with use of hemi techinque. OT Short Term Goal 4 (Week 2): Patient will complete squat-pivot transfers in 2 consecutive treatment sessions with Mod A and no more than 3 cues for safety awareness.  Skilled Therapeutic Interventions/Progress Updates:    Pt resting in bed upon arrival.  Attempted to engage pt is EOB activities and getting OOB. Pt physically resistant and verbally resistant to all EOB/OOB activities. RUE PROM/strething. Pt with significant tone at elbow and shoulder. Pt's R hand flacid.  Attempted to engaged pt in [facility] tasks at bed level with HOB elevated. Pt attends to his R (therapist sitting on R) but unable to intentionally activate RUE for volitional movement. Pt's wife called on phone and pt "engaged" in conversation with wife. Pt verbalized "I love you" when talking to wife. Pt became increasingly frustrated with continued therapy with RUE but continued to decline EOB or OOB activities. Pt remained in bed with bed alarm activated and all needs within reach. Pt missed 45 mins of scheduled OT services.   Therapy Documentation Precautions:  Precautions Precautions: Fall Precaution Comments: R hemiplegia, R inattention Restrictions Weight Bearing Restrictions: No General: General OT Amount of  Missed Time: 45 Minutes Pain:  No s/s of pain with RUE stretching/PROM   Therapy/Group: Individual Therapy  Rich Brave 10/02/2020, 10:47 AM

## 2020-10-02 NOTE — Progress Notes (Signed)
Physical Therapy Session Note  Patient Details  Name: Jason Romero MRN: [id number] Date of Birth: [birth year]  Today's Date: 10/02/2020 PT Individual Time: 0815-0913 PT Individual Time Calculation (min): 58 min   Short Term Goals: Week 1:  PT Short Term Goal 1 (Week 1): Pt will perfom bed mobility with mod assist and use of bed features PT Short Term Goal 2 (Week 1): Pt will propell WC 164ft with min assist PT Short Term Goal 3 (Week 1): Pt will tolerate sitting up in WC 2 hours between therapies PT Short Term Goal 4 (Week 1): Pt will perform sitting balance EOB with mod assist from PT up to 5 minutes Week 2:  PT Short Term Goal 1 (Week 2): Pt will perform bed mobility with mod A PT Short Term Goal 2 (Week 2): Pt will propel WC 188ft with min using hemi technique PT Short Term Goal 3 (Week 2): Pt will perform squat pivot transfer with mod assist to his R PT Short Term Goal 4 (Week 2): Pt will perform sit<>stand from bed with mod assist  Skilled Therapeutic Interventions/Progress Updates:    pt agreeable to don pants and prepare for OOB. Offered urinal but pt declined. Brief was clean. Pt more verbal today compared to last time this therapist saw patient. Encouragement provide throughout session to improve communication though demonstrates signicant source of frustration and demonstrates perseverative behaviors (fisting in LUE, slapping RUE) as increasingly frustruated throughout session.  Mod assist for rolling for donning of pants and modified bridging to complete. Mod assist for supine to sit with use of rail for support and facilitation at trunk needed and assist with RUE/RLE. Pt able to maintain balance at times with supervision seated EOB until task too dynaimc or pt demonstrates decreased graded movement and loses balance posterior requiring max assist. Donned shoes EOB with total assist and able to scoot with max assist to the R to prepare for transfer. A few attempts made with  multimodal cues for technique but then pt requesting to lay back down and unable to communicate why no longer wanting to participate. Encouragement and education provided while pt seated EOB x another 5-7 minutes and challenged functionally to remove shoes himself if wanting to lay back down. Pt throws body backwards and declines attempts made at various techniques. Ultimately returns to supine with mod assist and requiring assist for scooting up in bed and rolling to reposition. PROM to RUE and RLE for tone management and education on importance of OOB and participating fully in therapies. All needs in reach and PRAFO donned to RLE and RUE positioned on pillows for support.  Therapy Documentation Precautions:  Precautions Precautions: Fall Precaution Comments: R hemiplegia, R inattention Restrictions Weight Bearing Restrictions: No Pain:  Denies pain   Therapy/Group: Individual Therapy  Karolee Stamps Darrol Poke, PT, DPT, CBIS  10/02/2020, 10:19 AM

## 2020-10-03 ENCOUNTER — Inpatient Hospital Stay (HOSPITAL_COMMUNITY): Payer: Self-pay | Admitting: Occupational Therapy

## 2020-10-03 ENCOUNTER — Inpatient Hospital Stay (HOSPITAL_COMMUNITY): Payer: Self-pay

## 2020-10-03 ENCOUNTER — Inpatient Hospital Stay (HOSPITAL_COMMUNITY): Payer: Self-pay | Admitting: Speech Pathology

## 2020-10-03 LAB — GLUCOSE, CAPILLARY: Glucose-Capillary: 100 mg/dL — ABNORMAL HIGH (ref 70–99)

## 2020-10-03 NOTE — Progress Notes (Signed)
Speech Language Pathology Daily Session Note  Patient Details  Name: Jason Romero MRN: [id number] Date of Birth: 1971/01/06  Today's Date: 10/03/2020 SLP Individual Time: 0731-0830 SLP Individual Time Calculation (min): 59 min  Short Term Goals: Week 2: SLP Short Term Goal 1 (Week 2): Pt will consume current dysphagia 3 texture diet and honey thick liquids with Min A verbal and visual cueing for use of compensatory stategies and minimal overt s/sx aspiration. SLP Short Term Goal 2 (Week 2): Pt will consume therapeutic trials of thin H2O with SLP with minimal overt s/sx aspiration and no appreciable change in daily vitals across 1 week to demonstrate readiness repeat insturmental testing. SLP Short Term Goal 3 (Week 2): Pt will repeat at the word level with 75% accuracy provided Max A multimodal cues. SLP Short Term Goal 4 (Week 2): Pt will produce basic CV words with Max A multimodal cues. SLP Short Term Goal 5 (Week 2): Pt will demonstrate ability to use gestures and/or a communication board to communication basic wants and needs with Min A multimodal cues. SLP Short Term Goal 6 (Week 2): Pt will detect verbal and/or functional errors with Mod A multimodal cues.  Skilled Therapeutic Interventions: Pt was seen for skilled ST targeting dysphagia and communication goals. SLP facilitated session with skilled observation of pt consuming current diet (dys 3/honey liquids) breakfast tray to assess tolerance and independence with use of compensatory swallow strategies. Pt required Min A verbal cues for use of compensatory swallow strategies throughout intake. He demonstrated functional mastication and oral clearance and 1 immediate cough with last sip of honey thick juice when he was noted to take a large sip and tilted his head posteriorly - education him regarding neutral head position and tilting cup rather than his head when drink gets low. Recommend continue current diet. Pt noted to have increased  difficulty/more phonemic and semantic paraphasias today in comparison to yesterday. He required Max A multimodal cues during a phrase completion task, which he completed with ~50% accuracy (word level). He was only able to extend to repeat phrases with 25% accuracy. Pt requested SLP leave some words and white board with him at bedside, which SLP did. Pt left laying in bed with alarm set and needs within reach. Continue per current plan of care.          Pain Pain Assessment Pain Scale: 0-10 Pain Score: 0-No pain   Therapy/Group: Individual Therapy  Jason Romero 10/03/2020, 12:16 PM

## 2020-10-03 NOTE — Progress Notes (Signed)
Received a call from [place] reporting Jason Romero blood pressure SBP 90's. She was asked to obtain a manual blood pressure is 99/60. At this time we will hold his Lisinopril and she was asked to re-check his blood pressure in a hour and call provider, she verbalizes understanding.

## 2020-10-03 NOTE — Progress Notes (Signed)
Received a call from [place] reporting Jason Romero blood pressure is 120/80. Jason Romero blood pressure has been labile for days  and this will be discussed with Dr. Wynn Banker or Jesusita Oka PA in the morning. This provider spoke with Shari Heritage . We will Give Lisinopril now and re-check blood pressure at midnight. Also spoke with the pharmacist regarding The 22:00 dose of Labetalol 300 mg dose , due to hypotension. All questions answered. We will continue to monitor.

## 2020-10-03 NOTE — Progress Notes (Signed)
Slept fairly well throughout the night. Incontinent of urine.

## 2020-10-03 NOTE — Progress Notes (Signed)
[facility] PHYSICAL MEDICINE & REHABILITATION PROGRESS NOTE   Subjective/Complaints: In room working with SLP- she reports it's going a little rough with pt coming up with words, However was able to say "Ok"- and That RUE and RLE "not moving" after he pointed to them.   That's better than in previous weeks.   Still incontinent per nursing notes.    ROS- unable to assess due to aphasia   Objective:   No results found. No results for input(s): WBC, HGB, HCT, PLT in the last 72 hours. No results for input(s): NA, K, CL, CO2, GLUCOSE, BUN, CREATININE, CALCIUM in the last 72 hours.  Intake/Output Summary (Last 24 hours) at 10/03/2020 1927 Last data filed at 10/03/2020 1805 Gross per 24 hour  Intake 1440 ml  Output --  Net 1440 ml        Physical Exam: Vital Signs Blood pressure (!) 145/93, pulse 91, temperature 98 F (36.7 C), resp. rate 16, height 6' 1.5" (1.867 m), weight 104.2 kg, SpO2 98 %.   General: No acute distress; sitting up in bed; aphasic, SLP in room, NAD Mood and affect are appropriate- calm Heart: RRR Lungs: CTA B/L- no W/R/R- good air movement Abdomen: Soft, NT, ND, (+)BS  Extremities: No clubbing, cyanosis, or edema Skin: No evidence of breakdown, no evidence of rash Neurologic: Cranial nerves II through XII intact, motor strength is 5/5 inleft , 0/5 RIght  deltoid, bicep, tricep, grip, hip flexor, ankle dorsiflexor and plantar flexor 3- RIght knee ext! Sensory exam absent pinch RUE and RLE  Musculoskeletal: no pain with range of motion in all 4 extremities. No joint swelling   Assessment/Plan: 1. Functional deficits which require 3+ hours per day of interdisciplinary therapy in a comprehensive inpatient rehab setting.  Physiatrist is providing close team supervision and 24 hour management of active medical problems listed below.  Physiatrist and rehab team continue to assess barriers to discharge/monitor patient progress toward functional and  medical goals  Care Tool:  Bathing    Body parts bathed by patient: Right arm, Chest, Abdomen, Front perineal area, Left upper leg, Face   Body parts bathed by helper: Left arm, Buttocks, Right upper leg, Right lower leg, Left lower leg     Bathing assist Assist Level: Moderate Assistance - Patient 50 - 74%     Upper Body Dressing/Undressing Upper body dressing   What is the patient wearing?: Pull over shirt    Upper body assist Assist Level: Moderate Assistance - Patient 50 - 74%    Lower Body Dressing/Undressing Lower body dressing      What is the patient wearing?: Incontinence brief, Underwear/pull up     Lower body assist Assist for lower body dressing: Maximal Assistance - Patient 25 - 49%     Toileting Toileting Toileting Activity did not occur (Clothing management and hygiene only):  (2 assit stedy)  Toileting assist Assist for toileting: Maximal Assistance - Patient 25 - 49%     Transfers Chair/bed transfer  Transfers assist     Chair/bed transfer assist level: Moderate Assistance - Patient 50 - 74%     Locomotion Ambulation   Ambulation assist   Ambulation activity did not occur: Safety/medical concerns  Assist level: Maximal Assistance - Patient 25 - 49% Assistive device: Lite Gait Max distance: 81ft   Walk 10 feet activity   Assist  Walk 10 feet activity did not occur: Safety/medical concerns  Assist level: Maximal Assistance - Patient 25 - 49% Assistive device: Lite Gait  Walk 50 feet activity   Assist Walk 50 feet with 2 turns activity did not occur: Safety/medical concerns         Walk 150 feet activity   Assist Walk 150 feet activity did not occur: Safety/medical concerns         Walk 10 feet on uneven surface  activity   Assist Walk 10 feet on uneven surfaces activity did not occur: Safety/medical concerns         Wheelchair     Assist Will patient use wheelchair at discharge?: Yes Type of  Wheelchair: Manual    Wheelchair assist level: Dependent - Patient 0% Max wheelchair distance: 150    Wheelchair 50 feet with 2 turns activity    Assist        Assist Level: Dependent - Patient 0%   Wheelchair 150 feet activity     Assist      Assist Level: Dependent - Patient 0%   Blood pressure (!) 145/93, pulse 91, temperature 98 F (36.7 C), resp. rate 16, height 6' 1.5" (1.867 m), weight 104.2 kg, SpO2 98 %.    Medical Problem List and Plan: 1.  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   -Continue CIR PT, OT, SLP   2.  Antithrombotics: -DVT/anticoagulation: SCDs             -antiplatelet therapy: N/A 3. Pain Management: Tylenol as needed. Does not appear to be in pain.   11/22 denies pain multiple times- con't tylenol prn 4. Mood: Provide emotional support             -antipsychotic agents: N/A 5. Neuropsych: This patient is not capable of making decisions on his own behalf. 6. Skin/Wound Care: Routine skin checks 7. Fluids/Electrolytes/Nutrition: Routine in and outs with follow-up chemistries 8.  Dysphagia.  NPO.  Alternative means of nutritional support.  MBS- ok for D3, honey thick TF, cortrak d/ced 9.  Hypertension.  Norvasc 10 mg daily, hydralazine 100 mg every 8 hours, Normodyne 300 mg every 8 hours, lisinopril 20 mg twice daily.  Monitor with increased mobility Vitals:   10/03/20 1343 10/03/20 1347  BP: (!) 145/93 (!) 145/93  Pulse: 91 91  Resp:  16  Temp:    SpO2:     11/22- BP a little high, but overall good- con't regimen 10.  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  Provide counseling 11.  Obesity.  BMI  30.67-->31.36-->31.02  Dietary follow-up  12. Incontinence of bowel and bladder  Trouble using call bell also is aphasic, cont efforts at timed toileting       LOS: 13 days A FACE TO FACE EVALUATION WAS PERFORMED  Zafar Debrosse 10/03/2020, 7:27 PM

## 2020-10-03 NOTE — Progress Notes (Signed)
Physical Therapy Session Note  Patient Details  Name: Jason Romero MRN: [id number] Date of Birth: 01/18/1971  Today's Date: 10/03/2020 PT Individual Time: 1420-1525 PT Individual Time Calculation (min): 65 min   Short Term Goals: Week 2:  PT Short Term Goal 1 (Week 2): Pt will perform bed mobility with mod A PT Short Term Goal 2 (Week 2): Pt will propel WC 143ft with min using hemi technique PT Short Term Goal 3 (Week 2): Pt will perform squat pivot transfer with mod assist to his R PT Short Term Goal 4 (Week 2): Pt will perform sit<>stand from bed with mod assist  Skilled Therapeutic Interventions/Progress Updates:     Patient in bed upon PT arrival. Patient alert and agreeable to PT session. Patient denied pain during session.  Patient attempting to verbalize and able to verbalize 1-3 word phrases intermittently during session. Educated on importance of working through word finding difficulties and provided strategies throughout session. Patient receptive to education.   Patient exhibited decreased frustration tolerance with his limited R upper/lower extremity mobility during session. Provided education on improving awareness and proximal muscle activation. Encouraged patient to nurture his R side during recovery. Frustration improved, but still present intermittently after.   Therapeutic Activity: Bed Mobility: Patient performed supine to/from sit with min A for trunk and lower extremity managment. Provided verbal cues for rolling to L side then pushing to sit up setting L elbow to push up. Transfers: Patient performed squat pivot transfers bed<>w/c and w/c<>mat table with mod +2 x1 due to overshooting w/c on first trial, then min-mod A of 1 person all other trials. Provided demonstration for technique and cues for hand placement and head-hips relationship for proper technique and decreased assist with transfers. Blocked L knee and adjusted L foot throughout transfers.  Wheelchair  Mobility:  Patient propelled wheelchair >150 feet with min A-supervision using L hemi-technique, placed 2 pillows behind patient's back to reduce sacral sitting and increased mechanical advantage of L hamstrings. Provided verbal cues to initiate propulsion with L leg only then add L arm, facilitated correction for veering R with L foot and patient able to carry over into technique without assist, required intermittent assist to avoid objects on the R and heavy cues for visual scanning to the R due to inattention.   Neuromuscular Re-ed: Patient performed the following sitting balance and L upper/lower extremity motor contorl activities with supervision for balance seated EOM: -midline orientation with visual target for patient to bring his should to therapist's shoulder on his L to correct mild R lean with supervision and cues -lateral leans to the R with shoulder and wrist/hand approximation for weightbearing and initiation of push off to return to sitting -shoulder elevation/depression x6 with no activation progressing to minimal activation on the R -elbow flexion/extension with PNF tapping with trace activation x1-2/10 -knee flexion/extension with mat table raised so shoe off the floor AROM x10 with visual target for completing full available range (has tight hamstrings and unable to fully extend his knee in sitting without trunk compensation) -reaching outside of BOS with L upper extremity focused on return to midline and trunk control   Patient sat EOM without trunk support >20 min as PT changed w/c back. Provided cues for posture, midline orientation with fatigue, and engaged patient in conversation for improved word finding intermittently throughout. Patient reported improved support with molded back support.   Patient in bed at end of session with breaks locked, bed alarm set, and all needs within reach.  Therapy Documentation Precautions:  Precautions Precautions: Fall Precaution  Comments: R hemiplegia, R inattention Restrictions Weight Bearing Restrictions: No   Therapy/Group: Individual Therapy  Jordy Verba L Everhett Bozard PT, DPT  10/03/2020, 4:10 PM

## 2020-10-03 NOTE — Plan of Care (Signed)
  Problem: Consults Goal: RH STROKE PATIENT EDUCATION Description: See Patient Education module for education specifics  Outcome: Progressing Goal: Nutrition Consult-if indicated Outcome: Progressing   Problem: RH BOWEL ELIMINATION Goal: RH STG MANAGE BOWEL WITH ASSISTANCE Description: STG Manage Bowel with min Assistance. Outcome: Progressing Goal: RH STG MANAGE BOWEL W/MEDICATION W/ASSISTANCE Description: STG Manage Bowel with Medication with mod I Assistance. Outcome: Progressing   Problem: RH BLADDER ELIMINATION Goal: RH STG MANAGE BLADDER WITH ASSISTANCE Description: STG Manage Bladder With min Assistance Outcome: Progressing   Problem: RH SKIN INTEGRITY Goal: RH STG SKIN FREE OF INFECTION/BREAKDOWN Description: Skin will be free of infection/breakdown with min assist Outcome: Progressing Goal: RH STG MAINTAIN SKIN INTEGRITY WITH ASSISTANCE Description: STG Maintain Skin Integrity With min Assistance. Outcome: Progressing   Problem: RH SAFETY Goal: RH STG ADHERE TO SAFETY PRECAUTIONS W/ASSISTANCE/DEVICE Description: STG Adhere to Safety Precautions With cues/reminders Assistance/Device. Outcome: Progressing   Problem: RH COGNITION-NURSING Goal: RH STG USES MEMORY AIDS/STRATEGIES W/ASSIST TO PROBLEM SOLVE Description: STG Uses Memory Aids/Strategies With cues/reminders Assistance to Problem Solve. Outcome: Progressing   Problem: RH PAIN MANAGEMENT Goal: RH STG PAIN MANAGED AT OR BELOW PT'S PAIN GOAL Description: Pain will be managed at or below a 3 with min assist Outcome: Completed/Met   Problem: RH KNOWLEDGE DEFICIT Goal: RH STG INCREASE KNOWLEDGE OF HYPERTENSION Description: Pt/family will increase knowledge of hypertension with cues/reminders assist from handouts and other learning materials provided and use of medications with dietary modifications and lifestyle modifications Outcome: Progressing Goal: RH STG INCREASE KNOWLEDGE OF STROKE  PROPHYLAXIS Description: Pt/family will be able to increase knowledge of stroke prophylaxis with cues/reminders assist from handouts and other materials provided for medications Outcome: Progressing

## 2020-10-03 NOTE — Progress Notes (Signed)
Occupational Therapy Session Note  Patient Details  Name: Jason Romero MRN: [id number] Date of Birth: 1970-12-04  Today's Date: 10/03/2020 OT Individual Time: 4917-9150 OT Individual Time Calculation (min): 55 min    Short Term Goals: Week 2:  OT Short Term Goal 1 (Week 2): Patient will maintain static sitting balance at EOB with Min A in prep for ADLs. OT Short Term Goal 2 (Week 2): Patient complete 1/3 parts of LB dressing task seated EOB without lateral LOB. OT Short Term Goal 3 (Week 2): Patient will complete 2/3 parts of UB dressing task with use of hemi techinque. OT Short Term Goal 4 (Week 2): Patient will complete squat-pivot transfers in 2 consecutive treatment sessions with Mod A and no more than 3 cues for safety awareness.  Skilled Therapeutic Interventions/Progress Updates:  Patient met lying in supine in agreement with OT treatment session. No facial grimacing to indicate pain at rest or with activity. Supine to EOB with Mod A at RLE and trunk. Patient able to don pants in supine with Mod A +2 to thread RLE and hike over hips bridging in supine. Patient then able to transition from supine to long sitting to doff/don UB clothing with Min A to maintain sitting balance. Patient able to complete anterior scoot toward EOB with Mod A and cues for pacing. Patient able to maintain static sitting balance at EOB with Min A. Sit to stand in [place] with Min A +2, cues for hand placement, foot placement and pacing. Toilet transfer and toileting/hygiene/clothing management with Mod A +2 and cues for orientation to midline. Patient voided bladder and completed BM. Total A for wc mobility with patient able to maintain position without cueing. Half lap tray obtained to improve positioning in manual wc and maintain integrity of shoulder joint. Patient declined sitting at nurses station in manual wc so OT assisted back to supine with Mod A. Session concluded with patient lying supine in bed with call  bell within reach, bet alarm activated, and all needs met.    Therapy Documentation Precautions:  Precautions Precautions: Fall Precaution Comments: R hemiplegia, R inattention Restrictions Weight Bearing Restrictions: No General:    Therapy/Group: Individual Therapy  Joni Colegrove R [place]-Davis 10/03/2020, 7:27 AM

## 2020-10-03 NOTE — Progress Notes (Signed)
Patient systolic BP less than 110 did not give Hydralazine or Labetalol as instructed by NP will reassess in am and continue to monitor. Ilean Skill LPN

## 2020-10-03 NOTE — Progress Notes (Signed)
Occupational Therapy Session Note  Patient Details  Name: Jason Romero MRN: [id number] Date of Birth: 02/17/1971  Today's Date: 10/03/2020 OT Individual Time: 1100-1130 OT Individual Time Calculation (min): 30 min    Short Term Goals: Week 1:  OT Short Term Goal 1 (Week 1): Patient will maintain static sitting balance at EOB with Min A in prep for ADLs. OT Short Term Goal 1 - Progress (Week 1): Progressing toward goal OT Short Term Goal 2 (Week 1): Patient will complete 2/3 parts of UB dressing task with use of hemi techinque. OT Short Term Goal 2 - Progress (Week 1): Progressing toward goal OT Short Term Goal 3 (Week 1): Patient complete 1/3 parts of LB dressing task seated EOB. OT Short Term Goal 3 - Progress (Week 1): Progressing toward goal OT Short Term Goal 4 (Week 1): Patient will complete 2/3 grooming tasks seated at sink level. OT Short Term Goal 4 - Progress (Week 1): Met Week 2:  OT Short Term Goal 1 (Week 2): Patient will maintain static sitting balance at EOB with Min A in prep for ADLs. OT Short Term Goal 2 (Week 2): Patient complete 1/3 parts of LB dressing task seated EOB without lateral LOB. OT Short Term Goal 3 (Week 2): Patient will complete 2/3 parts of UB dressing task with use of hemi techinque. OT Short Term Goal 4 (Week 2): Patient will complete squat-pivot transfers in 2 consecutive treatment sessions with Mod A and no more than 3 cues for safety awareness.  Skilled Therapeutic Interventions/Progress Updates:    Patient in bed, alert and ready for therapy, he denies pain, expressive impairment noted but he is able to make wants/needs known.  Supine to sitting edge of bed with min A and cues for technique.  He is able to maintain unsupported sitting with CS with focus on posture, seated balance, trunk control and left UE NMRE activities.  Flexor tightness noted but able to inhibit for full ROM - reviewed techniques for him to practice.  Facilitation of scapula and  elbow extension with good results.  Returned to supine with min A, cues for positioning.  Completed supine NMRE.  He remained in bed at close of session, bed alarm set and call bell in reach.    Therapy Documentation Precautions:  Precautions Precautions: Fall Precaution Comments: R hemiplegia, R inattention Restrictions Weight Bearing Restrictions: No   Therapy/Group: Individual Therapy  Carlos Levering 10/03/2020, 7:39 AM

## 2020-10-04 ENCOUNTER — Inpatient Hospital Stay (HOSPITAL_COMMUNITY): Payer: Self-pay | Admitting: Physical Therapy

## 2020-10-04 ENCOUNTER — Inpatient Hospital Stay (HOSPITAL_COMMUNITY): Payer: Self-pay | Admitting: Occupational Therapy

## 2020-10-04 ENCOUNTER — Inpatient Hospital Stay (HOSPITAL_COMMUNITY): Payer: Self-pay | Admitting: Speech Pathology

## 2020-10-04 MED ORDER — HYDRALAZINE HCL 25 MG PO TABS: 25.0000 mg | ORAL_TABLET | Freq: Four times a day (QID) | ORAL | Status: DC

## 2020-10-04 MED ORDER — HYDRALAZINE HCL 25 MG PO TABS
25.0000 mg | ORAL_TABLET | Freq: Three times a day (TID) | ORAL | Status: DC
  Administered 2020-10-04 – 2020-10-19 (×44): 25 mg via ORAL
  Filled 2020-10-04 (×46): qty 1

## 2020-10-04 NOTE — Progress Notes (Signed)
Physical Therapy Session Note  Patient Details  Name: Jason Romero MRN: [id number] Date of Birth: 11/21/1970  Today's Date: 10/04/2020 PT Individual Time: 1435-1530 PT Individual Time Calculation (min): 55 min   Short Term Goals: Week 2:  PT Short Term Goal 1 (Week 2): Pt will perform bed mobility with mod A PT Short Term Goal 2 (Week 2): Pt will propel WC 164f with min using hemi technique PT Short Term Goal 3 (Week 2): Pt will perform squat pivot transfer with mod assist to his R PT Short Term Goal 4 (Week 2): Pt will perform sit<>stand from bed with mod assist  Skilled Therapeutic Interventions/Progress Updates: Pt presented in bed agreeable to therapy. Pt denies pain and did not demonstrate any pain during session. Performed supine to sit with modA and heavy use of bed features. Performed squat pivot transfer to R maxA with max multimodal cues for sequencing. Pt transported to day room and participated in w/c mobility via hemi-technique 254fminA then progressing to supervision with verbal cues for R obstacle negotiation. Pt then propelled to high/low mat and performed squat pivot transfer with modA to L. Pt participated in STS from lowered mat requiring modA and max multimodal cues for hand placement. Pt performed x 4 STS however unable to follow commands to reach hand back and pushed up from mat with LUE 2/4 times. While in standing PTA provided facilitation at hips for increased wt bearing through RLE.With PTA blocking R knee pt was able to move LLE to improve BOS. Pt also participated in bag toss while in standing with LUE. Pt was able to maintain fair static stand while reaching for bags and accurately toss to target. Pt required increased time between bouts due to fatigue. During rest breaks pt noted to maintain seated static balance with CLOSE supervision. Performed squat pivot to L with modA to return to w/c. Pt transported to nsg station and pt propelled approx [id number] to room. Pt  then performed squat pivot with use of bed rail to L to return to bed modA and required modA with use of bed features to perform sit to supine. Pt then performed bridge with PTA blocking B feet as PTA provided minA to pull pt's pants over hips. Pt then repositioned to comfort and left with bed alarm on, call bell within reach and needs met.      Therapy Documentation Precautions:  Precautions Precautions: Fall Precaution Comments: R hemiplegia, R inattention Restrictions Weight Bearing Restrictions: No General:   Vital Signs: Therapy Vitals Temp: 98.5 F (36.9 C) Pulse Rate: 99 Resp: 15 BP: (!) 142/96 Patient Position (if appropriate): Sitting Oxygen Therapy SpO2: 96 % O2 Device: Room Air Pain: Pain Assessment Faces Pain Scale: No hurt    Therapy/Group: Individual Therapy  Yanique Mulvihill  Oree Mirelez, PTA  10/04/2020, 3:41 PM

## 2020-10-04 NOTE — Progress Notes (Signed)
Speech Language Pathology Daily Session Note  Patient Details  Name: ALGIE WESTRY MRN: [id number] Date of Birth: 1971-01-05  Today's Date: 10/04/2020 SLP Individual Time: 9024-0973 SLP Individual Time Calculation (min): 42 min  Short Term Goals: Week 2: SLP Short Term Goal 1 (Week 2): Pt will consume current dysphagia 3 texture diet and honey thick liquids with Min A verbal and visual cueing for use of compensatory stategies and minimal overt s/sx aspiration. SLP Short Term Goal 2 (Week 2): Pt will consume therapeutic trials of thin H2O with SLP with minimal overt s/sx aspiration and no appreciable change in daily vitals across 1 week to demonstrate readiness repeat insturmental testing. SLP Short Term Goal 3 (Week 2): Pt will repeat at the word level with 75% accuracy provided Max A multimodal cues. SLP Short Term Goal 4 (Week 2): Pt will produce basic CV words with Max A multimodal cues. SLP Short Term Goal 5 (Week 2): Pt will demonstrate ability to use gestures and/or a communication board to communication basic wants and needs with Min A multimodal cues. SLP Short Term Goal 6 (Week 2): Pt will detect verbal and/or functional errors with Mod A multimodal cues.  Skilled Therapeutic Interventions: Pt was seen for skilled ST targeting dysphagia and cognitive-linguistic goals. Upon arrival, pt able to express need to void with Min A clarifying questions from SLP once he stated "I need" and pointed to restroom. Overall Min A verbal cues provided for safety awareness and attention to tasks during transfers via stedy (with NT assistance for safe transfers). He also required Min-Mod A verbal cueing for awareness of when his body started to lean, but able to correct to correct some errors such as foot placement on stedy with no more than the cues for awareness of error. Pt was continent of bowel, however brief was already soaked in urine. Once back to bed pt completed oral care via suction toothbrush  and consumed ~7 oz thin H2O with 3 cough responses. Mod faded to Min A verbal cues provided for slower rate of intake and smaller boluses, which did appear to reduce coughing (as most coughing occurred during first 2 presentations and large/fast sips). Recommend continue current diet for now, and ST will continue to work toward readiness of repeat MBS. Pt left laying in bed with alarm set and needs within reach. Continue per current plan of care.          Pain Pain Assessment Pain Scale: 0-10 Pain Score: 0-No pain  Therapy/Group: Individual Therapy  [name] 10/04/2020, 7:22 AM

## 2020-10-04 NOTE — Progress Notes (Signed)
[facility] PHYSICAL MEDICINE & REHABILITATION PROGRESS NOTE   Subjective/Complaints:  Working with SLP, no new issues   ROS- unable to assess due to aphasia   Objective:   No results found. No results for input(s): WBC, HGB, HCT, PLT in the last 72 hours. No results for input(s): NA, K, CL, CO2, GLUCOSE, BUN, CREATININE, CALCIUM in the last 72 hours.  Intake/Output Summary (Last 24 hours) at 10/04/2020 0827 Last data filed at 10/03/2020 1805 Gross per 24 hour  Intake 960 ml  Output --  Net 960 ml        Physical Exam: Vital Signs Blood pressure (!) 154/94, pulse 74, temperature 98 F (36.7 C), resp. rate 15, height 6' 1.5" (1.867 m), weight 104.2 kg, SpO2 94 %.    General: No acute distress Mood and affect are appropriate Heart: Regular rate and rhythm no rubs murmurs or extra sounds Lungs: Clear to auscultation, breathing unlabored, no rales or wheezes Abdomen: Positive bowel sounds, soft nontender to palpation, nondistended Extremities: No clubbing, cyanosis, or edema Skin: No evidence of breakdown, no evidence of rash   Neurologic: Cranial nerves II through XII intact, motor strength is 5/5 inleft ,trace r pec 0/5 RIght  deltoid, bicep, tricep, grip, hip flexor, ankle dorsiflexor and plantar flexor 3- RIght knee ext, synergy Sensory exam absent pinch RUE and RLE  Musculoskeletal: no pain with range of motion in all 4 extremities. No joint swelling   Assessment/Plan: 1. Functional deficits which require 3+ hours per day of interdisciplinary therapy in a comprehensive inpatient rehab setting.  Physiatrist is providing close team supervision and 24 hour management of active medical problems listed below.  Physiatrist and rehab team continue to assess barriers to discharge/monitor patient progress toward functional and medical goals  Care Tool:  Bathing    Body parts bathed by patient: Right arm, Chest, Abdomen, Front perineal area, Left upper leg, Face    Body parts bathed by helper: Left arm, Buttocks, Right upper leg, Right lower leg, Left lower leg     Bathing assist Assist Level: Moderate Assistance - Patient 50 - 74%     Upper Body Dressing/Undressing Upper body dressing   What is the patient wearing?: Pull over shirt    Upper body assist Assist Level: Moderate Assistance - Patient 50 - 74%    Lower Body Dressing/Undressing Lower body dressing      What is the patient wearing?: Incontinence brief, Underwear/pull up     Lower body assist Assist for lower body dressing: Maximal Assistance - Patient 25 - 49%     Toileting Toileting Toileting Activity did not occur (Clothing management and hygiene only):  (2 assit stedy)  Toileting assist Assist for toileting: Maximal Assistance - Patient 25 - 49%     Transfers Chair/bed transfer  Transfers assist     Chair/bed transfer assist level: Moderate Assistance - Patient 50 - 74%     Locomotion Ambulation   Ambulation assist   Ambulation activity did not occur: Safety/medical concerns  Assist level: Maximal Assistance - Patient 25 - 49% Assistive device: Lite Gait Max distance: 68ft   Walk 10 feet activity   Assist  Walk 10 feet activity did not occur: Safety/medical concerns  Assist level: Maximal Assistance - Patient 25 - 49% Assistive device: Lite Gait   Walk 50 feet activity   Assist Walk 50 feet with 2 turns activity did not occur: Safety/medical concerns         Walk 150 feet activity  Assist Walk 150 feet activity did not occur: Safety/medical concerns         Walk 10 feet on uneven surface  activity   Assist Walk 10 feet on uneven surfaces activity did not occur: Safety/medical concerns         Wheelchair     Assist Will patient use wheelchair at discharge?: Yes Type of Wheelchair: Manual    Wheelchair assist level: Minimal Assistance - Patient > 75%, Supervision/Verbal cueing Max wheelchair distance: >150 ft     Wheelchair 50 feet with 2 turns activity    Assist        Assist Level: Minimal Assistance - Patient > 75%, Supervision/Verbal cueing   Wheelchair 150 feet activity     Assist      Assist Level: Minimal Assistance - Patient > 75%, Supervision/Verbal cueing   Blood pressure (!) 154/94, pulse 74, temperature 98 F (36.7 C), resp. rate 15, height 6' 1.5" (1.867 m), weight 104.2 kg, SpO2 94 %.    Medical Problem List and Plan: 1.  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  Antithrombotics: -DVT/anticoagulation: SCDs             -antiplatelet therapy: N/A 3. Pain Management: Tylenol as needed. Does not appear to be in pain.   11/22 denies pain multiple times- con't tylenol prn 4. Mood: Provide emotional support             -antipsychotic agents: N/A 5. Neuropsych: This patient is not capable of making decisions on his own behalf. 6. Skin/Wound Care: Routine skin checks 7. Fluids/Electrolytes/Nutrition: Routine in and outs with follow-up chemistries 8.  Dysphagia.  improving   MBS- ok for D3, honey thick TF,  9.  Hypertension.  Norvasc 10 mg daily, hydralazine 100 mg every 8 hours, Normodyne 300 mg every 8 hours, lisinopril 20 mg twice daily.  Monitor with increased mobility Vitals:   10/04/20 0327 10/04/20 0756  BP: 134/88 (!) 154/94  Pulse: 89 74  Resp: 15   Temp: 98 F (36.7 C)   SpO2: 94%    11/23 some lability , have been reducing hydralazine dose down to 25mg  TID, will need to increase to QID 10.  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  Provide counseling 11.  Obesity.  BMI  30.67-->31.36-->31.02  Dietary follow-up  12. Incontinence of bowel and bladder  Trouble using call bell also is aphasic, cont efforts at timed toileting  , pt now pointing to bathroom     LOS: 14 days A FACE TO FACE EVALUATION WAS PERFORMED  10/04/2020, 8:27 AM

## 2020-10-04 NOTE — Progress Notes (Signed)
Occupational Therapy Session Note  Patient Details  Name: Jason Romero MRN: [id number] Date of Birth: 1970-12-28  Today's Date: 10/04/2020 OT Individual Time: 1000-1115 OT Individual Time Calculation (min): 75 min    Short Term Goals: Week 2:  OT Short Term Goal 1 (Week 2): Patient will maintain static sitting balance at EOB with Min A in prep for ADLs. OT Short Term Goal 2 (Week 2): Patient complete 1/3 parts of LB dressing task seated EOB without lateral LOB. OT Short Term Goal 3 (Week 2): Patient will complete 2/3 parts of UB dressing task with use of hemi techinque. OT Short Term Goal 4 (Week 2): Patient will complete squat-pivot transfers in 2 consecutive treatment sessions with Mod A and no more than 3 cues for safety awareness.  Skilled Therapeutic Interventions/Progress Updates:    Patient in bed, alert and ready for therapy session - Yes/no responses appear accurate, he continues to struggle with words and phrases to make wants and needs known.  He is able to read/state name on name tag and identify words on note cards.  Supine to sitting edge of bed with min A, cues for technique.  Unsupported sitting with CS, occ CGA.  Completed right UE weight bearing, proximal control activities - he notes occ volitional hand movement today - he continues with subluxation and poor proximal control during transitional movement.   Sit to stand in stedy with min/mod A - utilized stedy to/from bed, shower bench and w/c.  Shower completed seated with mod A overall.  Dressing completed w/c level - mod A for [place] shirt, mod/max A for pants, max A incontinence brief, dependent for teds and slipper socks - dependent for CM in stance with stedy.  Dependent for hair care and attempts to brush out knots.  Returned to bed via stedy, sit to supine with mod A.  He remained in bed at close of session, bed alarm set and call bell in hand.    Therapy Documentation Precautions:  Precautions Precautions:  Fall Precaution Comments: R hemiplegia, R inattention Restrictions Weight Bearing Restrictions: No   Therapy/Group: Individual Therapy  Barrie Lyme 10/04/2020, 7:39 AM

## 2020-10-04 NOTE — Progress Notes (Signed)
Nutrition Follow-up  DOCUMENTATION CODES:   Not applicable  INTERVENTION:   - Continue Magic cup TID with meals, each supplement provides 290 kcal and 9 grams of protein  - Continue double protein portions TID with meals  - Encourage adequate PO intake and provide feeding assistance as needed  - Will monitor for diet advancement and add nutrition supplements as appropriate  NUTRITION DIAGNOSIS:   Inadequate oral intake related to dysphagia as evidenced by NPO status.  Progressing, pt now on dysphagia 3 diet with honey thick liquids  GOAL:   Patient will meet greater than or equal to 90% of their needs  Progressing  MONITOR:   PO intake, Supplement acceptance, Diet advancement, Labs, Weight trends, TF tolerance  REASON FOR ASSESSMENT:   Consult Enteral/tube feeding initiation and management  ASSESSMENT:   49 year old male with PMH of COPD, tobacco abuse, HTN, seizures, atrial fibrillation. Presented 09/11/20 with right-sided weakness and facial droop. Cranial CT scan showed intraparenchymal hematoma in the left basal ganglia. ICH felt to be related to hypertensive crisis. Pt was placed on Cleviprex for blood pressure control. Pt is currently NPO with Cortrak in place for tube feeds. Admitted to CIR on 11/09.  11/17 - MBS, diet advanced to dysphagia 3 with honey-thick liquids 11/18 - Cortrak removed  Unable to reach pt via phone call to room. PO intake is improving with meal completions between 50-100%. Weight is trending down. Will continue to monitor. Once liquids are advanced past honey-thick, will order additional oral nutrition supplements to aid pt in meeting kcal and protein needs.  CIR admit weight: 107.2 kg Current weight: 104.2 kg  Meal Completion: 50-100%  Medications reviewed and include: protonix, senna  Labs reviewed.  Diet Order:   Diet Order            DIET DYS 3 Room service appropriate? No; Fluid consistency: Honey Thick  Diet effective now                  EDUCATION NEEDS:   Education needs have been addressed  Skin:  Skin Assessment: Reviewed RN Assessment  Last BM:  10/04/20  Height:   Ht Readings from Last 1 Encounters:  09/20/20 6' 1.5" (1.867 m)    Weight:   Wt Readings from Last 1 Encounters:  10/03/20 104.2 kg    BMI:  Body mass index is 29.9 kg/m.  Estimated Nutritional Needs:   Kcal:  2300-2500  Protein:  115-135 grams  Fluid:  >/= 2.0 L    Mertie Clause, MS, RD, LDN Inpatient Clinical Dietitian Please see [facility] for contact information.

## 2020-10-05 ENCOUNTER — Inpatient Hospital Stay (HOSPITAL_COMMUNITY): Payer: Self-pay | Admitting: *Deleted

## 2020-10-05 ENCOUNTER — Inpatient Hospital Stay (HOSPITAL_COMMUNITY): Payer: Self-pay | Admitting: Occupational Therapy

## 2020-10-05 ENCOUNTER — Inpatient Hospital Stay (HOSPITAL_COMMUNITY): Payer: Self-pay

## 2020-10-05 NOTE — Progress Notes (Signed)
[facility] PHYSICAL MEDICINE & REHABILITATION PROGRESS NOTE   Subjective/Complaints: Denies abdominal pain this morning Had another large loose, foul smelling stool this morning with OT Otherwise has no complaints.   ROS- unable to assess due to expressive aphasia   Objective:   No results found. No results for input(s): WBC, HGB, HCT, PLT in the last 72 hours. No results for input(s): NA, K, CL, CO2, GLUCOSE, BUN, CREATININE, CALCIUM in the last 72 hours.  Intake/Output Summary (Last 24 hours) at 10/05/2020 0919 Last data filed at 10/05/2020 0500 Gross per 24 hour  Intake 773 ml  Output 100 ml  Net 673 ml        Physical Exam: Vital Signs Blood pressure 131/86, pulse 87, temperature 97.9 F (36.6 C), resp. rate 15, height 6' 1.5" (1.867 m), weight 104 kg, SpO2 98 %. Gen: no distress, normal appearing HEENT: oral mucosa pink and moist, NCAT Cardio: Reg rate Chest: normal effort, normal rate of breathing Abd: soft, non-distended Ext: no edema Skin: intact Neurologic: Cranial nerves II through XII intact, motor strength is 5/5 inleft ,trace r pec 0/5 RIght  deltoid, bicep, tricep, grip, hip flexor, ankle dorsiflexor and plantar flexor 3- RIght knee ext, synergy Sensory exam absent pinch RUE and RLE  Musculoskeletal: no pain with range of motion in all 4 extremities. No joint swelling   Assessment/Plan: 1. Functional deficits which require 3+ hours per day of interdisciplinary therapy in a comprehensive inpatient rehab setting.  Physiatrist is providing close team supervision and 24 hour management of active medical problems listed below.  Physiatrist and rehab team continue to assess barriers to discharge/monitor patient progress toward functional and medical goals  Care Tool:  Bathing    Body parts bathed by patient: Right arm, Chest, Abdomen, Front perineal area, Left upper leg, Face, Right upper leg   Body parts bathed by helper: Left arm, Buttocks, Right  lower leg, Left lower leg, Right arm     Bathing assist Assist Level: Moderate Assistance - Patient 50 - 74%     Upper Body Dressing/Undressing Upper body dressing   What is the patient wearing?: Pull over shirt    Upper body assist Assist Level: Moderate Assistance - Patient 50 - 74%    Lower Body Dressing/Undressing Lower body dressing      What is the patient wearing?: Pants, Incontinence brief     Lower body assist Assist for lower body dressing: Maximal Assistance - Patient 25 - 49%     Toileting Toileting Toileting Activity did not occur (Clothing management and hygiene only):  (2 assit stedy)  Toileting assist Assist for toileting: Maximal Assistance - Patient 25 - 49%     Transfers Chair/bed transfer  Transfers assist     Chair/bed transfer assist level: Moderate Assistance - Patient 50 - 74%     Locomotion Ambulation   Ambulation assist   Ambulation activity did not occur: Safety/medical concerns  Assist level: Maximal Assistance - Patient 25 - 49% Assistive device: Lite Gait Max distance: 34ft   Walk 10 feet activity   Assist  Walk 10 feet activity did not occur: Safety/medical concerns  Assist level: Maximal Assistance - Patient 25 - 49% Assistive device: Lite Gait   Walk 50 feet activity   Assist Walk 50 feet with 2 turns activity did not occur: Safety/medical concerns         Walk 150 feet activity   Assist Walk 150 feet activity did not occur: Safety/medical concerns  Walk 10 feet on uneven surface  activity   Assist Walk 10 feet on uneven surfaces activity did not occur: Safety/medical concerns         Wheelchair     Assist Will patient use wheelchair at discharge?: Yes Type of Wheelchair: Manual    Wheelchair assist level: Minimal Assistance - Patient > 75%, Supervision/Verbal cueing Max wheelchair distance: >150 ft    Wheelchair 50 feet with 2 turns activity    Assist        Assist  Level: Minimal Assistance - Patient > 75%, Supervision/Verbal cueing   Wheelchair 150 feet activity     Assist      Assist Level: Minimal Assistance - Patient > 75%, Supervision/Verbal cueing   Blood pressure 131/86, pulse 87, temperature 97.9 F (36.6 C), resp. rate 15, height 6' 1.5" (1.867 m), weight 104 kg, SpO2 98 %.    Medical Problem List and Plan: 1.  Right side hemiplegia, aphasia and dysphagia secondary to left basal ganglia ICH related to hypertensive crisis             -patient may shower             -ELOS/Goals:12/8- discussed with pt Improved quad strength 11/19  Increased efforts at speech, minimal pec activation on righ tas well as some R knee ext  -Continue CIR PT, OT, SLP  2.  Antithrombotics: -DVT/anticoagulation: SCDs             -antiplatelet therapy: N/A 3. Pain Management: Tylenol as needed. Does not appear to be in pain.   11/24: complained of abdominal pain last night but that has resolved this morning..  4. Mood: Provide emotional support             -antipsychotic agents: N/A 5. Neuropsych: This patient is not capable of making decisions on his own behalf. 6. Skin/Wound Care: Routine skin checks 7. Fluids/Electrolytes/Nutrition: Routine in and outs with follow-up chemistries 8.  Dysphagia.  improving   MBS- ok for D3, honey thick TF,  9.  Hypertension.  Norvasc 10 mg daily, hydralazine 100 mg every 8 hours, Normodyne 300 mg every 8 hours, lisinopril 20 mg twice daily.  Monitor with increased mobility Vitals:   10/04/20 2214 10/05/20 0455  BP: (!) 149/103 131/86  Pulse: 77 87  Resp:  15  Temp:  97.9 F (36.6 C)  SpO2:  98%   11/23 some lability , have been reducing hydralazine dose down to 25mg  TID, will need to increase to QID  11/24: BP is elevated to 149/103 last night, stable this morning. Continue to monitor.  10.  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  Provide  counseling 11.  Obesity.  BMI 30.67-->31.36-->31.02  Dietary follow-up  12. Incontinence of bowel and bladder  Trouble using call bell also is aphasic, cont efforts at timed toileting  , pt now pointing to bathroom 13. 11/24: Increased flexor tone- continue to monitor. As per OT has been affecting balance but alertness is much improved and I don't want to sedate with anti-spasticity medications.      LOS: 15 days A FACE TO FACE EVALUATION WAS PERFORMED  12/24 Keaundre Thelin 10/05/2020, 9:19 AM

## 2020-10-05 NOTE — Progress Notes (Signed)
Patient ID: Jason Romero, male   DOB: [birth year], 49 y.o.   MRN: [id number] Team Conference Report to Patient/Family  Team Conference discussion was reviewed with the patient and caregiver, including goals, any changes in plan of care and target discharge date.  Patient and caregiver express understanding and are in agreement.  The patient has a target discharge date of 10/19/20.  [name] 10/05/2020, 1:21 PM

## 2020-10-05 NOTE — Progress Notes (Signed)
Occupational Therapy Session Note  Patient Details  Name: Jason Romero MRN: [id number] Date of Birth: [birth year]  Today's Date: 10/05/2020 OT Individual Time: 1093-2355 OT Individual Time Calculation (min): 66 min    Short Term Goals: Week 2:  OT Short Term Goal 1 (Week 2): Patient will maintain static sitting balance at EOB with Min A in prep for ADLs. OT Short Term Goal 2 (Week 2): Patient complete 1/3 parts of LB dressing task seated EOB without lateral LOB. OT Short Term Goal 3 (Week 2): Patient will complete 2/3 parts of UB dressing task with use of hemi techinque. OT Short Term Goal 4 (Week 2): Patient will complete squat-pivot transfers in 2 consecutive treatment sessions with Mod A and no more than 3 cues for safety awareness.  Skilled Therapeutic Interventions/Progress Updates:    Patient in bed, alert, able to tell me that he had been incontinent of bowel - he denies pain at this time.  Dependent to clean up large loose/liquid stool and donn clean brief and bed pad.  Dependent to donn teds and slipper socks, max A to donn pants.  Supine to sit with min A and cues for technique.  Sit pivot transfer bed to w/c mod A.  Completed oral care and face washing at sink w/c level with set up. Completed right arm NMRE with focus on inhibition of flexors shoulder to hand with flat hand paddle and weight bearing activities with good results - overall noting increased flexion synergy.  Sit pivot transfer back to bed min A to left with moderate cues.  Sit to supine mod A.  He remained in bed at close of session, bed alarm set and call bell in reach.    Therapy Documentation Precautions:  Precautions Precautions: Fall Precaution Comments: R hemiplegia, R inattention Restrictions Weight Bearing Restrictions: No  Therapy/Group: Individual Therapy  Barrie Lyme 10/05/2020, 7:27 AM

## 2020-10-05 NOTE — Progress Notes (Signed)
Patient ID: Jason Romero, male   DOB: 1971-05-07, 49 y.o.   MRN: [id number]   SW was able to contact patient sister to provide team conference updates from this week and past week. Sister informed sw patient spouse and mother phone currently are out of service. She will provided updates to family, She has also provided additional contact information for pt's mother, [name].  [phone]

## 2020-10-05 NOTE — Progress Notes (Signed)
Physical Therapy Session Note  Patient Details  Name: Jason Romero MRN: [id number] Date of Birth: 21-Nov-1970  Today's Date: 10/05/2020 PT Individual Time: 2197-5883  And 1345-1445 PT Individual Time Calculation (min): 42 min and 60 min  Short Term Goals: Week 2:  PT Short Term Goal 1 (Week 2): Pt will perform bed mobility with mod A PT Short Term Goal 2 (Week 2): Pt will propel WC 130f with min using hemi technique PT Short Term Goal 3 (Week 2): Pt will perform squat pivot transfer with mod assist to his R PT Short Term Goal 4 (Week 2): Pt will perform sit<>stand from bed with mod assist  Skilled Therapeutic Interventions/Progress Updates: Pt presented in bed agreeable to therapy. Pt denies pain during session. Performed supine to sit with use of bed features with modA and more purposeful movements noted. Pt performed squat pivot transfer to w/c with modA and max cues for sequencing. Pt transported to ortho gym and participated in varying BITS programs, including visual scanning, tracing, and sequencing all performed at w/c level. Pt was able to tap screen with more purposeful movements particularly when reaching to R with LUE. Pt then performed x2 bouts of visual scanning in standing. Pt required minA for stand however initially modA to maintain balance once in standing due to pt shifting wt with LLE. With PTA blocking R knee pt was able to attain safe standing and able to complete task. In standing pt performed x 2 bouts of visual scanning with first attempt attaining 92% in 1:22, on second bout pt achieved 100% in 1:02. Pt notably fatigued after standing activities. Pt transported back to room at end of session and performed squat pivot transfer minA with pt reaching for bed rail with LUE. Pt required modA for sit to supine and was able to reposition self to comfort with verbal cues and minA. Pt left in bed with bed alarm on, call bell within reach and x4 rails up per pt request.   Tx2: Pt  presented in bed with SO present agreeable to therapy. Pt denies pain during session. Session focused on wt bearing in standing via use of Lite Gait. Pt performed supine to sit with modA and use of bed feautres. Performed squat pivot transfer to R with modA and max multimodal cues. Pt transported to day room and performed squat pivot transfer to R with modA and more consistent sequencing. Pt set up in Lite Gait with R hand splinted to handle with Ace bandage. Pt participated in gait training on level tile with Lite Gait. Pt required total A for advancing RLE however intermittent initiation noted with facilitation at hips for wt shifting and PTA blocking R knee. Pt was able to ambulate 124fand 2079fith seated rest break between bouts. Pt was able to demonstrate improving LLE advancement with ambulation. Pt transported back to room and performed squat pivot with minA and use of bed rail. Pt required modA to return to supine and was able to reposition to comfort. Pt then requesting something to drink and PTA providing honey thickened juice which pt consumed with supervision. Once completed pt left in bed with bed alarm on, call bell within reach and needs met.      Therapy Documentation Precautions:  Precautions Precautions: Fall Precaution Comments: R hemiplegia, R inattention Restrictions Weight Bearing Restrictions: No General:   Vital Signs: Therapy Vitals Temp: (!) 97.5 F (36.4 C) Pulse Rate: 90 Resp: 18 BP: (!) 144/95 Patient Position (if appropriate): Lying Oxygen  Therapy SpO2: 100 % O2 Device: Room Air Pain:   Mobility:   Locomotion :    Trunk/Postural Assessment :    Balance:   Exercises:   Other Treatments:      Therapy/Group: Individual Therapy  Avian Konigsberg 10/05/2020, 4:23 PM

## 2020-10-05 NOTE — Progress Notes (Signed)
Pt rested well through out the night. Pt c/o lower abd pain. Pt having multiple type 6 & 7 BM. Pt requesting medication to help with his abd pain.

## 2020-10-05 NOTE — Patient Care Conference (Signed)
Inpatient RehabilitationTeam Conference and Plan of Care Update Date: 10/05/2020   Time: 10:09 AM    Patient Name: Jason Romero      Medical Record Number: [id number]  Date of Birth: 1971/08/06 Sex: Male         Room/Bed: 4W03C/4W03C-01 Payor Info: Payor: MEDICAID POTENTIAL / Plan: MEDICAID POTENTIAL / Product Type: *No Product type* /    Admit Date/Time:  09/20/2020  4:04 PM  Primary Diagnosis:  ICH (intracerebral hemorrhage) [facility])  Hospital Problems: Principal Problem:   ICH (intracerebral hemorrhage) [facility])    Expected Discharge Date: Expected Discharge Date: 10/19/20  Team Members Present: Physician leading conference: Dr. Sula Soda Care Coodinator Present: Chana Bode, RN, BSN, CRRN;Christina Vita Barley, BSW Nurse Present: Magdalen Spatz) [place], LPN PT Present: Harless Litten, PTA OT Present: Towanda Malkin, OT SLP Present: Elio Forget, SLP PPS Coordinator present : Edson Snowball, [place] Breed, SLP     Current Status/Progress Goal Weekly Team Focus  Bowel/Bladder   pt incont of b/b, LBM 10/05/20. multiple type 6 BM. Pt has contient episodes with timed toileting.  pt will regain continence of b/b  Q2h/PRN  toileting   Swallow/Nutrition/ Hydration   Dys 3/honey thick liquids, full supervision, Min-Mod reducing impulsivity with intake/compensatory swallow strategies  Min A  trials of thin liquids to work toward repeat MBSS, trials regular textures, increase independence with strategies for oral clearance and reducing impulsive intake   ADL's   UB bathing/dressing mod A, LB bathing/dressing max A w/c level, to shower via stedy  Min to Mod A  adl training, functional transfer training, right NMRE, balance, family education   Mobility   modA bed mobility, modA squat pivot transfers, maxA STS, minA to supervision w/c mobility,  minA bed mobilitiy, modA transfers, supervision w/c mobility  OOB tolerance, w/c mobilty, transfers, standing balance, R NMR    Communication   Mod-Max, verbal output and ability to communicate basic needs is improving, using combination of words and gestures for basic needs  Mod-Min A  functional/high frequency words and phrases, word and phrase level communication tasks   Safety/Cognition/ Behavioral Observations  Min A, awareness improving  Min-Supervision  attention, awareness, basic problem solving   Pain   Pt c/o generalized pain. PRN tylenol effective.  Pt will be free of pain  Assess pain qshift/prn   Skin   Pts buttocks red, and blanchable. Barrier cream applied. Pt ecnouraged to rotate positions through out the night.  Pt will be free of breakdown an further infection.  Assess skin Qshift/PRN     Discharge Planning:  Discharging home with spouse, sister and mother (Spouse has COPD and sister lives out of town) No previous DME, 1 Level home, level entry   Team Discussion: Patient is more alert. Note flexor tone right UE . Some movement in arm and volitional extension. Patient is incontinent; bowel issues addressed. Progress impaired by poor safety awareness and impulsivity. Patient on target to meet rehab goals: yes,  Currently mod assist for upper body care and max assist for lower body care. Mod assist for transfers and supervision for wheelchair mobility. Mod-Max assist goals set.  *See Care Plan and progress notes for long and short-term goals.   Revisions to Treatment Plan:  Work on Proofreader however gait is non functional MBS pending and thin trials with SLP  Teaching Needs: Tranfers, toileting, medications, etc.   Current Barriers to Discharge: Decreased caregiver support, Incontinence and Behavior  Possible Resolutions to Barriers: Family  education     Medical Summary Current Status: Increased right sided flexor tone, elevated systolic and diastolic BP, >7 foul smelling stool in past 24 hours, expressive aphasia  Barriers to Discharge:  Medical stability;Behavior  Barriers to Discharge Comments: Increased right sided flexor tone, elevated systolic and diastolic BP, >7 foul smelling stool in past 24 hours, expressive aphasia Possible Resolutions to [employer] Focus: Continue to monitor right sided flexor tone, consider anti-spasticity medications if worsens but do not want to oversedate, monitor BP TID (hydralazine increased)   Continued Need for Acute Rehabilitation Level of Care: The patient requires daily medical management by a physician with specialized training in physical medicine and rehabilitation for the following reasons: Medical management of patient stability for increased activity during participation in an intensive rehabilitation regime.: Yes Analysis of laboratory values and/or radiology reports with any subsequent need for medication adjustment and/or medical intervention. : Yes   I attest that I was present, lead the team conference, and concur with the assessment and plan of the team.   Chana Bode B 10/05/2020, 2:05 PM

## 2020-10-05 NOTE — Progress Notes (Signed)
Pt tolerated wrist splint for 4 hours. Pt then requested help removing it.

## 2020-10-05 NOTE — Progress Notes (Signed)
Occupational Therapy Session Note  Patient Details  Name: Jason Romero MRN: [id number] Date of Birth: 1970/11/25  Today's Date: 10/05/2020 OT Individual Time: 1515-1600 OT Individual Time Calculation (min): 45 min    Short Term Goals: Week 1:  OT Short Term Goal 1 (Week 1): Patient will maintain static sitting balance at EOB with Min A in prep for ADLs. OT Short Term Goal 1 - Progress (Week 1): Progressing toward goal OT Short Term Goal 2 (Week 1): Patient will complete 2/3 parts of UB dressing task with use of hemi techinque. OT Short Term Goal 2 - Progress (Week 1): Progressing toward goal OT Short Term Goal 3 (Week 1): Patient complete 1/3 parts of LB dressing task seated EOB. OT Short Term Goal 3 - Progress (Week 1): Progressing toward goal OT Short Term Goal 4 (Week 1): Patient will complete 2/3 grooming tasks seated at sink level. OT Short Term Goal 4 - Progress (Week 1): Met  Skilled Therapeutic Interventions/Progress Updates:    1:1. Pt received in bed agreeable to OT with PROM provided to LUE for tone management and to decrease risk of contracture with special attention to facilitate stretcing opposite flexor syngery in prep for functional movement. Pt provided with scapular mobilization for B scap elevation and depression. With flat hand paddle on L hand, pt "pushes OT away" to facilitate elbow extension and WB through wrist and shoulder and OT bends elbow back to starting position. Edu provided to pt on hemi distribution, CVA recovery, timeline/prognosis of improving deficits and sensory impacts of CVA. Exited session with pt seated in bed, exit alarm on and call light in reach   Therapy Documentation Precautions:  Precautions Precautions: Fall Precaution Comments: R hemiplegia, R inattention Restrictions Weight Bearing Restrictions: No General:   Vital Signs:   Pain: Pain Assessment Pain Scale: 0-10 Pain Score: 0-No pain ADL: ADL Eating: NPO  ([employer]) Grooming: Minimal assistance Where Assessed-Grooming: Bed level Upper Body Bathing: Moderate assistance, Maximal cueing Where Assessed-Upper Body Bathing: Edge of bed Lower Body Bathing: Moderate assistance Where Assessed-Lower Body Bathing: Bed level Upper Body Dressing: Moderate assistance Where Assessed-Upper Body Dressing: Edge of bed Lower Body Dressing: Maximal assistance, Maximal cueing Where Assessed-Lower Body Dressing: Bed level Toileting: Dependent (Incontinent x2) Where Assessed-Toileting: Bed level Toilet Transfer: Not assessed Tub/Shower Transfer: Not assessed Vision   Perception    Praxis   Exercises:   Other Treatments:     Therapy/Group: Individual Therapy  [name] 10/05/2020, 12:06 PM

## 2020-10-06 NOTE — Progress Notes (Signed)
PHYSICAL MEDICINE & REHABILITATION PROGRESS NOTE   Subjective/Complaints:  Pt denies pain this AM- waiting for wife for thanksgiving visit.  When saw pt, R arm stuck underneath his body at weird angle- pt didn't notice.   ROS- unable to determine due to aphasia   Objective:   No results found. No results for input(s): WBC, HGB, HCT, PLT in the last 72 hours. No results for input(s): NA, K, CL, CO2, GLUCOSE, BUN, CREATININE, CALCIUM in the last 72 hours.  Intake/Output Summary (Last 24 hours) at 10/06/2020 1010 Last data filed at 10/06/2020 0802 Gross per 24 hour  Intake 960 ml  Output 200 ml  Net 760 ml        Physical Exam: Vital Signs Blood pressure 117/83, pulse 89, temperature 97.6 F (36.4 C), temperature source Oral, resp. rate 18, height 6' 1.5" (1.867 m), weight 104.3 kg, SpO2 99 %. Gen: sitting up in bed- waiting for wife, able to make self understood better this AM, NAD HEENT: conjugate gaze at midline Cardio: RRR Chest: CTA B/L- no W/R/R- good air movement Abd: Soft, NT, ND, (+)BS  Ext: no edema Skin: intact Neurologic: Cranial nerves II through XII intact, motor strength is 5/5 inleft ,trace r pec 0/5 RIght  deltoid, bicep, tricep, grip, hip flexor, ankle dorsiflexor and plantar flexor 3- RIght knee ext, synergy Sensory exam absent pinch RUE and RLE R arm stuck underneath him at weird angle- didn't notice- I fixed it.  Musculoskeletal: no pain with range of motion in all 4 extremities. No joint swelling   Assessment/Plan: 1. Functional deficits which require 3+ hours per day of interdisciplinary therapy in a comprehensive inpatient rehab setting.  Physiatrist is providing close team supervision and 24 hour management of active medical problems listed below.  Physiatrist and rehab team continue to assess barriers to discharge/monitor patient progress toward functional and medical goals  Care Tool:  Bathing    Body parts bathed by patient:  Right arm, Chest, Abdomen, Front perineal area, Left upper leg, Face, Right upper leg   Body parts bathed by helper: Left arm, Buttocks, Right lower leg, Left lower leg, Right arm     Bathing assist Assist Level: Moderate Assistance - Patient 50 - 74%     Upper Body Dressing/Undressing Upper body dressing   What is the patient wearing?: Pull over shirt    Upper body assist Assist Level: Moderate Assistance - Patient 50 - 74%    Lower Body Dressing/Undressing Lower body dressing      What is the patient wearing?: Pants, Incontinence brief     Lower body assist Assist for lower body dressing: Maximal Assistance - Patient 25 - 49%     Toileting Toileting Toileting Activity did not occur (Clothing management and hygiene only):  (2 assit stedy)  Toileting assist Assist for toileting: Maximal Assistance - Patient 25 - 49%     Transfers Chair/bed transfer  Transfers assist     Chair/bed transfer assist level: Moderate Assistance - Patient 50 - 74%     Locomotion Ambulation   Ambulation assist   Ambulation activity did not occur: Safety/medical concerns  Assist level: Maximal Assistance - Patient 25 - 49% Assistive device: Lite Gait Max distance: 34ft   Walk 10 feet activity   Assist  Walk 10 feet activity did not occur: Safety/medical concerns  Assist level: Maximal Assistance - Patient 25 - 49% Assistive device: Lite Gait   Walk 50 feet activity   Assist Walk 50 feet with  2 turns activity did not occur: Safety/medical concerns         Walk 150 feet activity   Assist Walk 150 feet activity did not occur: Safety/medical concerns         Walk 10 feet on uneven surface  activity   Assist Walk 10 feet on uneven surfaces activity did not occur: Safety/medical concerns         Wheelchair     Assist Will patient use wheelchair at discharge?: Yes Type of Wheelchair: Manual    Wheelchair assist level: Minimal Assistance - Patient > 75%,  Supervision/Verbal cueing Max wheelchair distance: >150 ft    Wheelchair 50 feet with 2 turns activity    Assist        Assist Level: Minimal Assistance - Patient > 75%, Supervision/Verbal cueing   Wheelchair 150 feet activity     Assist      Assist Level: Minimal Assistance - Patient > 75%, Supervision/Verbal cueing   Blood pressure 117/83, pulse 89, temperature 97.6 F (36.4 C), temperature source Oral, resp. rate 18, height 6' 1.5" (1.867 m), weight 104.3 kg, SpO2 99 %.    Medical Problem List and Plan: 1.  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   -Continue CIR PT, OT, SLP  2.  Antithrombotics: -DVT/anticoagulation: SCDs             -antiplatelet therapy: N/A 3. Pain Management: Tylenol as needed. Does not appear to be in pain.   11/24: complained of abdominal pain last night but that has resolved this morning.  11/25- denies pain- con't tylenol prn.  4. Mood: Provide emotional support             -antipsychotic agents: N/A 5. Neuropsych: This patient is not capable of making decisions on his own behalf. 6. Skin/Wound Care: Routine skin checks 7. Fluids/Electrolytes/Nutrition: Routine in and outs with follow-up chemistries  11/25- since on honey thick liquids, will check labs in AM- has been awhile 8.  Dysphagia.  improving   MBS- ok for D3, honey thick TF,  9.  Hypertension.  Norvasc 10 mg daily, hydralazine 100 mg every 8 hours, Normodyne 300 mg every 8 hours, lisinopril 20 mg twice daily.  Monitor with increased mobility Vitals:   10/05/20 2000 10/06/20 0519  BP: (!) 146/80 117/83  Pulse: 93 89  Resp: 20 18  Temp: (!) 97.3 F (36.3  C) 97.6 F (36.4 C)  SpO2: 99% 99%   11/23 some lability , have been reducing hydralazine dose down to 25mg  TID, will need to increase to QID  11/24: BP is elevated to 149/103 last night, stable this morning. Continue to monitor.   11/25- BP 117-146/80s- in last 24 hours- con't regimen 10.  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  Provide counseling 11.  Obesity.  BMI 30.67-->31.36-->31.02  Dietary follow-up  12. Incontinence of bowel and bladder  Trouble using call bell also is aphasic, cont efforts at timed toileting  , pt now pointing to bathroom 13.  11/24: Increased flexor tone- continue to monitor. As per OT has been affecting balance but alertness is much improved and I don't want to sedate with anti-spasticity medications.   11/25- refused braces/splints last night.      LOS: 16 days A FACE TO FACE EVALUATION WAS PERFORMED  Burdett Pinzon 10/06/2020, 10:10 AM

## 2020-10-06 NOTE — Progress Notes (Signed)
Pt refused splint and boot for the night. Pt encouraged to wear devices, pt continued to refuse.

## 2020-10-07 ENCOUNTER — Inpatient Hospital Stay (HOSPITAL_COMMUNITY): Payer: Self-pay

## 2020-10-07 ENCOUNTER — Inpatient Hospital Stay (HOSPITAL_COMMUNITY): Payer: Self-pay | Admitting: Occupational Therapy

## 2020-10-07 ENCOUNTER — Inpatient Hospital Stay (HOSPITAL_COMMUNITY): Payer: Self-pay | Admitting: Speech Pathology

## 2020-10-07 DIAGNOSIS — D62 Acute posthemorrhagic anemia: Secondary | ICD-10-CM

## 2020-10-07 DIAGNOSIS — I69391 Dysphagia following cerebral infarction: Secondary | ICD-10-CM

## 2020-10-07 DIAGNOSIS — G8110 Spastic hemiplegia affecting unspecified side: Secondary | ICD-10-CM

## 2020-10-07 LAB — CBC WITH DIFFERENTIAL/PLATELET
Abs Immature Granulocytes: 0.06 10*3/uL (ref 0.00–0.07)
Basophils Absolute: 0.1 10*3/uL (ref 0.0–0.1)
Basophils Relative: 1 %
Eosinophils Absolute: 0.3 10*3/uL (ref 0.0–0.5)
Eosinophils Relative: 3 %
HCT: 36.2 % — ABNORMAL LOW (ref 39.0–52.0)
Hemoglobin: 11.6 g/dL — ABNORMAL LOW (ref 13.0–17.0)
Immature Granulocytes: 1 %
Lymphocytes Relative: 21 %
Lymphs Abs: 1.9 10*3/uL (ref 0.7–4.0)
MCH: 28.1 pg (ref 26.0–34.0)
MCHC: 32 g/dL (ref 30.0–36.0)
MCV: 87.7 fL (ref 80.0–100.0)
Monocytes Absolute: 1 10*3/uL (ref 0.1–1.0)
Monocytes Relative: 11 %
Neutro Abs: 5.7 10*3/uL (ref 1.7–7.7)
Neutrophils Relative %: 63 %
Platelets: 348 10*3/uL (ref 150–400)
RBC: 4.13 MIL/uL — ABNORMAL LOW (ref 4.22–5.81)
RDW: 14.6 % (ref 11.5–15.5)
WBC: 8.9 10*3/uL (ref 4.0–10.5)
nRBC: 0 % (ref 0.0–0.2)

## 2020-10-07 LAB — BASIC METABOLIC PANEL
Anion gap: 13 (ref 5–15)
BUN: 17 mg/dL (ref 6–20)
CO2: 22 mmol/L (ref 22–32)
Calcium: 9.3 mg/dL (ref 8.9–10.3)
Chloride: 106 mmol/L (ref 98–111)
Creatinine, Ser: 1.06 mg/dL (ref 0.61–1.24)
GFR, Estimated: >60 mL/min (ref >60)
Glucose, Bld: 102 mg/dL — ABNORMAL HIGH (ref 70–99)
Potassium: 4.1 mmol/L (ref 3.5–5.1)
Sodium: 141 mmol/L (ref 135–145)

## 2020-10-07 NOTE — Progress Notes (Signed)
Speech Language Pathology Weekly Progress and Session Note  Patient Details  Name: Jason Romero MRN: [id number] Date of Birth: 04-Feb-1971  Beginning of progress report period: September 29, 2020 End of progress report period: October 07, 2020  Today's Date: 10/07/2020 SLP Individual Time: 0730-0826 SLP Individual Time Calculation (min): 56 min  Short Term Goals: Week 2: SLP Short Term Goal 1 (Week 2): Pt will consume current dysphagia 3 texture diet and honey thick liquids with Min A verbal and visual cueing for use of compensatory stategies and minimal overt s/sx aspiration. SLP Short Term Goal 1 - Progress (Week 2): Met SLP Short Term Goal 2 (Week 2): Pt will consume therapeutic trials of thin H2O with SLP with minimal overt s/sx aspiration and no appreciable change in daily vitals across 1 week to demonstrate readiness repeat insturmental testing. SLP Short Term Goal 2 - Progress (Week 2): Progressing toward goal SLP Short Term Goal 3 (Week 2): Pt will repeat at the word level with 75% accuracy provided Max A multimodal cues. SLP Short Term Goal 3 - Progress (Week 2): Met SLP Short Term Goal 4 (Week 2): Pt will produce basic CV words with Max A multimodal cues. SLP Short Term Goal 4 - Progress (Week 2): Met SLP Short Term Goal 5 (Week 2): Pt will demonstrate ability to use gestures and/or a communication board to communication basic wants and needs with Min A multimodal cues. SLP Short Term Goal 5 - Progress (Week 2): Met SLP Short Term Goal 6 (Week 2): Pt will detect verbal and/or functional errors with Mod A multimodal cues. SLP Short Term Goal 6 - Progress (Week 2): Met    New Short Term Goals: Week 3: SLP Short Term Goal 1 (Week 3): Pt will consume therapeutic trials of thin H2O with SLP with minimal overt s/sx aspiration and no appreciable change in daily vitals across 3 sesions to demonstrate readiness repeat insturmental testing. SLP Short Term Goal 2 (Week 3): Pt will  consume upgraded trials of regular texture solids with efficient mastication and oral clearance with no more than Min A cues for use of compensatory strategies X2 prior to advancement. SLP Short Term Goal 3 (Week 3): Pt will produce basic CV words with Mod A multimodal cues. SLP Short Term Goal 4 (Week 3): Pt will verbalize high frequency and functional words/short phrases with 75% accuracy provided Mod A cues. SLP Short Term Goal 5 (Week 3): Pt will sustain attention to functional tasks for 10 minutes with Min A cues.  Weekly Progress Updates: Pt has made functional gains and met 5 out of 6 short term goals. Pt is currently Max assist for functional verbal communication, but communicates his basic wants and needs with gestures and words with Min-Mod A due to severe expressive aphasia. He is also ~Min A for basic attention, and awareness, as well as use of compensatory swallow strategies. Pt is consuming a dysphagia 3 texture  (mechanical soft) diet with honey thick liquids and min buccal pocketing. He is working toward repeat MBSS with trials of thin H2O with SLP. Pt has demonstrated improved participation, attention, and ability to repeat words and phrases with cueing. Pt and family education is ongoing. Pt would continue to benefit from skilled ST while inpatient in order to maximize functional independence and reduce burden of care prior to discharge. Anticipate that pt will need 24/7 supervision at discharge in addition to [facility] follow up at next level of care.       Intensity:  Minumum of 1-2 x/day, 30 to 90 minutes Frequency: 3 to 5 out of 7 days Duration/Length of Stay: 10/19/20 Treatment/Interventions: Cognitive remediation/compensation;Cueing hierarchy;Dysphagia/aspiration precaution training;Functional tasks;Internal/external aids;Multimodal communication approach;Speech/Language facilitation;Therapeutic Activities;Patient/family education   Daily Session  Skilled Therapeutic Interventions:  Pt was seen for skilled ST targeting communication and dysphagia goals. Upon arrival, pt communicated need for brief change, as he and bed were soiled, with only Min A verbal cues for clarification of needs. He also expressed preferences during session and engaged in simple exchange about Thanksgiving food with Mod A verbal cues from clinician for clarification and/or to rephrase into yes/no multiple choice. During consumption of current diet (dys 3/honey) breakfast, pt exhibited right buccal pocketing, requiring Min A verbal cues from SLP for clearance. No overt s/sx aspiration observed across any textures. Recommend continue current diet. Pt left laying in bed with alarm set and needs within reach. Continue per current plan of care.            Pain Pain Assessment Pain Scale: 0-10 Pain Score: 0-No pain  Therapy/Group: Individual Therapy  Arbutus Leas 10/07/2020, 12:10 PM

## 2020-10-07 NOTE — Progress Notes (Signed)
Physical Therapy Weekly Progress Note  Patient Details  Name: Jason Romero MRN: [id number] Date of Birth: [birth year]  Beginning of progress report period: September 29, 2020 End of progress report period: October 07, 2020   Patient has met 4 of 4 short term goals.  Pt has demonstrated fair improvements during current course of therapy. Pt is demonstrating some improvement in purposeful movement with less impulsivity noted. Pt now requires overall modA for bed mobility and squat pivot transfers have improved to modA to R (nearing minA). Pt's sitting balance has improved and w/c mobility has been initiated with pt able to propel distances up to 110f with overall supervision at time requiring cues for obstacle negotiation. Pt has begun to perform standing activities with RW and can perform STS from mat with consistent minA.  Gait training has been initiated with use of Lite Gait mainly. Goal may need to be downgraded prior to discharge but will continue to work on this in therapies this week. Do not anticipate patient to perform any gait with family at d/c as it continues to require skilled intervention for safety.  Patient continues to demonstrate the following deficits muscle weakness and muscle paralysis, decreased cardiorespiratoy endurance, impaired timing and sequencing, abnormal tone, unbalanced muscle activation, decreased coordination and decreased motor planning and decreased awareness, decreased problem solving and decreased safety awareness and therefore will continue to benefit from skilled PT intervention to increase functional independence with mobility.  Patient progressing toward long term goals..  Continue plan of care.  PT Short Term Goals Week 2:  PT Short Term Goal 1 (Week 2): Pt will perform bed mobility with mod A PT Short Term Goal 1 - Progress (Week 2): Met PT Short Term Goal 2 (Week 2): Pt will propel WC 1012fwith min using hemi technique PT Short Term Goal 2 - Progress  (Week 2): Met PT Short Term Goal 3 (Week 2): Pt will perform squat pivot transfer with mod assist to his R PT Short Term Goal 3 - Progress (Week 2): Met PT Short Term Goal 4 (Week 2): Pt will perform sit<>stand from bed with mod assist PT Short Term Goal 4 - Progress (Week 2): Met Week 3:  PT Short Term Goal 1 (Week 3): =LTGs   Balance/vestibular training;Ambulation/gait training;Cognitive remediation/compensation;Community reintegration;DME/adaptive equipment instruction;Disease management/prevention;Functional electrical stimulation;Discharge planning;Functional mobility training;Neuromuscular re-education;Psychosocial support;Patient/family education;Pain management;Skin care/wound management;Splinting/orthotics;Stair training;Therapeutic Exercise;Therapeutic Activities;UE/LE Strength taining/ROM;UE/LE Coordination activities;Visual/perceptual remediation/compensation;Wheelchair propulsion/positioning   Therapy Documentation Precautions:  Precautions Precautions: Fall Precaution Comments: R hemiplegia, R inattention Restrictions Weight Bearing Restrictions: No   Rosita DeChalus, PTA  AlLars MassonPT, DPT, CBIS  10/11/2020, 3:48 PM

## 2020-10-07 NOTE — Progress Notes (Signed)
[facility] PHYSICAL MEDICINE & REHABILITATION PROGRESS NOTE   Subjective/Complaints: Patient seen laying in bed this morning, working with therapies.  He indicates he slept well overnight.  No reported issues overnight.  Discussed the patient with therapies.  ROS- unable to determine due to aphasia   Objective:   No results found. Recent Labs    10/07/20 0440  WBC 8.9  HGB 11.6*  HCT 36.2*  PLT 348   Recent Labs    10/07/20 0440  NA 141  K 4.1  CL 106  CO2 22  GLUCOSE 102*  BUN 17  CREATININE 1.06  CALCIUM 9.3    Intake/Output Summary (Last 24 hours) at 10/07/2020 1531 Last data filed at 10/07/2020 1300 Gross per 24 hour  Intake 960 ml  Output --  Net 960 ml        Physical Exam: Vital Signs Blood pressure (!) 141/88, pulse 99, temperature (!) 97.4 F (36.3 C), resp. rate 16, height 6' 1.5" (1.867 m), weight 108.8 kg, SpO2 99 %. Constitutional: No distress . Vital signs reviewed. HENT: Normocephalic.  Atraumatic. Eyes: EOMI. No discharge. Cardiovascular: No JVD.  RRR. Respiratory: Normal effort.  No stridor.  Bilateral clear to auscultation. GI: Non-distended.  BS +. Skin: Warm and dry.  Intact. Psych: Limited due to aphasia Musc: No edema in extremities.  No tenderness in extremities. Neurologic: Alert Expressive >> receptive aphasia Motor: RUE/RLE: 0/5 proximal distal  Assessment/Plan: 1. Functional deficits which require 3+ hours per day of interdisciplinary therapy in a comprehensive inpatient rehab setting.  Physiatrist is providing close team supervision and 24 hour management of active medical problems listed below.  Physiatrist and rehab team continue to assess barriers to discharge/monitor patient progress toward functional and medical goals  Care Tool:  Bathing    Body parts bathed by patient: Right arm, Chest, Abdomen, Front perineal area, Left upper leg, Face, Right upper leg   Body parts bathed by helper: Left arm, Buttocks, Right  lower leg, Left lower leg, Right arm     Bathing assist Assist Level: Moderate Assistance - Patient 50 - 74%     Upper Body Dressing/Undressing Upper body dressing   What is the patient wearing?: Pull over shirt    Upper body assist Assist Level: Moderate Assistance - Patient 50 - 74%    Lower Body Dressing/Undressing Lower body dressing      What is the patient wearing?: Pants, Incontinence brief     Lower body assist Assist for lower body dressing: Maximal Assistance - Patient 25 - 49%     Toileting Toileting Toileting Activity did not occur (Clothing management and hygiene only):  (2 assit stedy)  Toileting assist Assist for toileting: Maximal Assistance - Patient 25 - 49%     Transfers Chair/bed transfer  Transfers assist     Chair/bed transfer assist level: Moderate Assistance - Patient 50 - 74%     Locomotion Ambulation   Ambulation assist   Ambulation activity did not occur: Safety/medical concerns  Assist level: Maximal Assistance - Patient 25 - 49% Assistive device: Lite Gait Max distance: 60ft   Walk 10 feet activity   Assist  Walk 10 feet activity did not occur: Safety/medical concerns  Assist level: Maximal Assistance - Patient 25 - 49% Assistive device: Lite Gait   Walk 50 feet activity   Assist Walk 50 feet with 2 turns activity did not occur: Safety/medical concerns         Walk 150 feet activity   Assist Walk  150 feet activity did not occur: Safety/medical concerns         Walk 10 feet on uneven surface  activity   Assist Walk 10 feet on uneven surfaces activity did not occur: Safety/medical concerns         Wheelchair     Assist Will patient use wheelchair at discharge?: Yes Type of Wheelchair: Manual    Wheelchair assist level: Minimal Assistance - Patient > 75%, Supervision/Verbal cueing Max wheelchair distance: >150 ft    Wheelchair 50 feet with 2 turns activity    Assist        Assist  Level: Minimal Assistance - Patient > 75%, Supervision/Verbal cueing   Wheelchair 150 feet activity     Assist      Assist Level: Minimal Assistance - Patient > 75%, Supervision/Verbal cueing   Blood pressure (!) 141/88, pulse 99, temperature (!) 97.4 F (36.3 C), resp. rate 16, height 6' 1.5" (1.867 m), weight 108.8 kg, SpO2 99 %.    Medical Problem List and Plan: 1.  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  Antithrombotics: -DVT/anticoagulation: SCDs             -antiplatelet therapy: N/A 3. Pain Management: Tylenol as needed.   Appears to be controlled on 11/26 4. Mood: Provide emotional support             -antipsychotic agents: N/A 5. Neuropsych: This patient is not capable of making decisions on his own behalf. 6. Skin/Wound Care: Routine skin checks 7. Fluids/Electrolytes/Nutrition: Routine in and outs  BMP within acceptable range on 11/26  8.  Post stroke dysphagia.    D3 honey, advance as tolerated 9.  Hypertension.  Norvasc 10 mg daily, Normodyne 300 mg every 8 hours, lisinopril 20 mg twice daily.  Monitor with increased mobility  Hydralazine decreased to 25 3 times daily Vitals:   10/07/20 0503 10/07/20 1421  BP: (!) 145/100 (!) 141/88  Pulse: 82 99  Resp: 20 16  Temp: 97.6 F (36.4 C) (!) 97.4 F (36.3 C)  SpO2: 96% 99%   Slightly labile on 11/26, monitor for trend 10.  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  Provide counseling when appropriate 11.  Obesity.  BMI 30.67-->31.36-->31.02  Dietary follow-up  12. Incontinence of bowel and bladder  Trouble using call bell also is aphasic, cont efforts at timed toileting  , pt now pointing to bathroom 13.  Now with spasticity   Per OT has been affecting balance   Refused braces/splints last night again.  14.  Acute blood loss anemia  Hemoglobin 11.6 on  11/26  Continue to monitor  LOS: 17 days A FACE TO FACE EVALUATION WAS PERFORMED  Shalika Arntz Karis Juba 10/07/2020, 3:31 PM

## 2020-10-07 NOTE — Progress Notes (Signed)
Occupational Therapy Session Note  Patient Details  Name: Jason Romero MRN: [id number] Date of Birth: 1970/12/28  Today's Date: 10/07/2020 OT Individual Time: 1300-1413 OT Individual Time Calculation (min): 73 min   Skilled Therapeutic Interventions/Progress Updates:    Pt greeted in bed with no c/o pain, agreeable to shower today. Bed mobility completed with Min A for for the Rt side. CGA for sit<stand in bariatric St[place] with Rt arm protected. Pt then bathed while seated on bariatric 3:1. Vcs and Min A for utilizing one handed strategies to wash the Lt side. He was able to utilize figure 4 position with the Lt LE to wash his foot, assistance needed for washing the Rt foot and Rt arm with significant sublux. We also washed his hair which appeared to brighten affect. Dressing was completed w/c level sit<stand using the RW. Mod A for sit<stand and for static standing balance while 2nd helper elevated pants over hips. Total A for Teds and gripper socks. Min A for donning overhead shirt with manual assist to utilize hemi strategies. For remainder of session, worked on OOB tolerance and expressive communication while working the tangles out of his hair and conversing with pt. He returned to bed with Min A stand pivot using the bedrail though was cued to complete a squat pivot. Pt returned to bed with Mod A and was assisted in sidelying position on the Lt side for pressure relief and protecting the Rt arm. Left him with all needs within reach and bed alarm set.   Therapy Documentation Precautions:  Precautions Precautions: Fall Precaution Comments: R hemiplegia, R inattention Restrictions Weight Bearing Restrictions: No Vital Signs: Therapy Vitals Temp: (!) 97.4 F (36.3 C) Pulse Rate: 99 Resp: 16 BP: (!) 141/88 Patient Position (if appropriate): Lying Oxygen Therapy SpO2: 99 % O2 Device: Room Air Pain: Pain Assessment Pain Scale: 0-10 Pain Score: 0-No pain ADL: ADL Eating: NPO  ([employer]) Grooming: Minimal assistance Where Assessed-Grooming: Bed level Upper Body Bathing: Moderate assistance, Maximal cueing Where Assessed-Upper Body Bathing: Edge of bed Lower Body Bathing: Moderate assistance Where Assessed-Lower Body Bathing: Bed level Upper Body Dressing: Moderate assistance Where Assessed-Upper Body Dressing: Edge of bed Lower Body Dressing: Maximal assistance, Maximal cueing Where Assessed-Lower Body Dressing: Bed level Toileting: Dependent (Incontinent x2) Where Assessed-Toileting: Bed level Toilet Transfer: Not assessed Tub/Shower Transfer: Not assessed      Therapy/Group: Individual Therapy  Gardenia Witter A [place] Scheidegger 10/07/2020, 3:17 PM

## 2020-10-07 NOTE — Progress Notes (Signed)
Pt tolerated R hand split for 3-4 hours while sleeping. Pt refused the boot for R foot. Pt denies pain at this time. Pt resting in bed

## 2020-10-07 NOTE — Progress Notes (Signed)
Physical Therapy Session Note  Patient Details  Name: Jason Romero MRN: [id number] Date of Birth: [birth year]  Today's Date: 10/07/2020 PT Individual Time: 1023-1122 PT Individual Time Calculation (min): 59 min   Short Term Goals: Week 2:  PT Short Term Goal 1 (Week 2): Pt will perform bed mobility with mod A PT Short Term Goal 2 (Week 2): Pt will propel WC 19ft with min using hemi technique PT Short Term Goal 3 (Week 2): Pt will perform squat pivot transfer with mod assist to his R PT Short Term Goal 4 (Week 2): Pt will perform sit<>stand from bed with mod assist  Skilled Therapeutic Interventions/Progress Updates: Pt presented in bed agreeable to therapy. Pt denies pain during session. PTA and tech donned TED hose total A and PTA threaded pants total A for time management. Pt able to lift RLE to assist in threading pants. Pt performed bridge to allow PTA to pull pants over hips. Pt performed supine to sit with modA and use of bed features and PTA assisted in donning scrub top with modA. Pt performed squat pivot to w/c with mod nearing minA with significant more controlled movements. Pt transported to ortho gym and pt performed squat pivot transfer to R onto mat with minA. Pt then participated in ball kicks for forced use of RLE x 15. Pt also participated in ball taps in sitting for dynamic sitting balance with pt able to perform moderate reaches outside BOS and crossing midline without LOB and more controlled velocity in reaching. Participated in STS from lowered mat with RW and minA x 4. When in standing PTA facilitating wt shifting to R for wt bearing through RLE working on having pt NOT move LLE. Pt also participated in toe taps to 4in step with LLE with PTA blocking R knee x 10. Pt attempted to perform toe taps to target on floor with RLE however unable to recruit enough hip flexor activation to slide RLE. After seated rest pt performed squat pivot transfer back to w/c to L with minA (nearing  CGA). Pt transported to day room and participated in Cybex Kinetron 80cm/sec from w/c 20cycles x 2 with PTA providing AA for RLE for glute and hamstring recruitment. Pt transpoted back to room at end of session and performed squat pivot transfer to L with minA to bed. Pt required modA sit to supine for BLE management. Pt able to reposition self to comfort and pt requesting PTA to dial phone to call wife. Pt left in bed at end of session with bed alarm on, call bell within reach and engaged on phone with wife.      Therapy Documentation Precautions:  Precautions Precautions: Fall Precaution Comments: R hemiplegia, R inattention Restrictions Weight Bearing Restrictions: No General:   Vital Signs:  Pain: Pain Assessment Pain Scale: 0-10 Pain Score: 0-No pain    Therapy/Group: Individual Therapy  Kenden Brandt Ahuva Poynor, PTA  10/07/2020, 12:59 PM

## 2020-10-08 NOTE — Progress Notes (Signed)
Pt agreeable to wearing hand splint, but refuses boot.

## 2020-10-08 NOTE — Progress Notes (Signed)
[facility] PHYSICAL MEDICINE & REHABILITATION PROGRESS NOTE   Subjective/Complaints: Patient seen laying in bed this morning.  He indicates he slept well overnight.  No reported issues overnight.  No PRAFO donned, discussed with nursing, patient refused.  ROS- unable to determine due to aphasia   Objective:   No results found. Recent Labs    10/07/20 0440  WBC 8.9  HGB 11.6*  HCT 36.2*  PLT 348   Recent Labs    10/07/20 0440  NA 141  K 4.1  CL 106  CO2 22  GLUCOSE 102*  BUN 17  CREATININE 1.06  CALCIUM 9.3    Intake/Output Summary (Last 24 hours) at 10/08/2020 1145 Last data filed at 10/08/2020 0811 Gross per 24 hour  Intake 590 ml  Output --  Net 590 ml        Physical Exam: Vital Signs Blood pressure (!) 139/95, pulse 78, temperature 97.7 F (36.5 C), temperature source Oral, resp. rate 18, height 6' 1.5" (1.867 m), weight 105.1 kg, SpO2 92 %. Constitutional: No distress . Vital signs reviewed. HENT: Normocephalic.  Atraumatic. Eyes: EOMI. No discharge. Cardiovascular: No JVD.  RRR. Respiratory: Normal effort.  No stridor.  Bilateral clear to auscultation. GI: Non-distended.  BS +. Skin: Warm and dry.  Intact. Psych: Limited due to aphasia, slowly improving Neurologic: Alert Expressive >> receptive aphasia, unchanged Motor: RUE/RLE: 0/5 proximal distal, unchanged  Assessment/Plan: 1. Functional deficits which require 3+ hours per day of interdisciplinary therapy in a comprehensive inpatient rehab setting.  Physiatrist is providing close team supervision and 24 hour management of active medical problems listed below.  Physiatrist and rehab team continue to assess barriers to discharge/monitor patient progress toward functional and medical goals  Care Tool:  Bathing    Body parts bathed by patient: Right arm, Chest, Abdomen, Front perineal area, Left upper leg, Face, Right upper leg   Body parts bathed by helper: Left arm, Buttocks, Right lower  leg, Left lower leg, Right arm     Bathing assist Assist Level: Moderate Assistance - Patient 50 - 74%     Upper Body Dressing/Undressing Upper body dressing   What is the patient wearing?: Pull over shirt    Upper body assist Assist Level: Moderate Assistance - Patient 50 - 74%    Lower Body Dressing/Undressing Lower body dressing      What is the patient wearing?: Pants, Incontinence brief     Lower body assist Assist for lower body dressing: Maximal Assistance - Patient 25 - 49%     Toileting Toileting Toileting Activity did not occur (Clothing management and hygiene only):  (2 assit stedy)  Toileting assist Assist for toileting: Maximal Assistance - Patient 25 - 49%     Transfers Chair/bed transfer  Transfers assist     Chair/bed transfer assist level: Moderate Assistance - Patient 50 - 74%     Locomotion Ambulation   Ambulation assist   Ambulation activity did not occur: Safety/medical concerns  Assist level: Maximal Assistance - Patient 25 - 49% Assistive device: Lite Gait Max distance: 31ft   Walk 10 feet activity   Assist  Walk 10 feet activity did not occur: Safety/medical concerns  Assist level: Maximal Assistance - Patient 25 - 49% Assistive device: Lite Gait   Walk 50 feet activity   Assist Walk 50 feet with 2 turns activity did not occur: Safety/medical concerns         Walk 150 feet activity   Assist Walk 150 feet activity did  not occur: Safety/medical concerns         Walk 10 feet on uneven surface  activity   Assist Walk 10 feet on uneven surfaces activity did not occur: Safety/medical concerns         Wheelchair     Assist Will patient use wheelchair at discharge?: Yes Type of Wheelchair: Manual    Wheelchair assist level: Minimal Assistance - Patient > 75%, Supervision/Verbal cueing Max wheelchair distance: >150 ft    Wheelchair 50 feet with 2 turns activity    Assist        Assist Level:  Minimal Assistance - Patient > 75%, Supervision/Verbal cueing   Wheelchair 150 feet activity     Assist      Assist Level: Minimal Assistance - Patient > 75%, Supervision/Verbal cueing   Blood pressure (!) 139/95, pulse 78, temperature 97.7 F (36.5 C), temperature source Oral, resp. rate 18, height 6' 1.5" (1.867 m), weight 105.1 kg, SpO2 92 %.    Medical Problem List and Plan: 1.  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  Antithrombotics: -DVT/anticoagulation: SCDs             -antiplatelet therapy: N/A 3. Pain Management: Tylenol as needed.   Appears to be controlled on 11/27 4. Mood: Provide emotional support             -antipsychotic agents: N/A 5. Neuropsych: This patient is not capable of making decisions on his own behalf. 6. Skin/Wound Care: Routine skin checks 7. Fluids/Electrolytes/Nutrition: Routine in and outs  BMP within acceptable range on 11/26  8.  Post stroke dysphagia.    D3 honey, advance as tolerated 9.  Hypertension.  Norvasc 10 mg daily, Normodyne 300 mg every 8 hours, lisinopril 20 mg twice daily.  Monitor with increased mobility  Hydralazine decreased to 25 3 times daily Vitals:   10/07/20 2030 10/08/20 0412  BP: 112/85 (!) 139/95  Pulse: 84 78  Resp: 20 18  Temp: 98.1 F (36.7 C) 97.7 F (36.5 C)  SpO2: 99% 92%   Relatively controlled on 11/27 10.  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  Provide counseling when appropriate 11.  Obesity.  BMI 30.67-->31.36-->31.02  Dietary follow-up  12. Incontinence of bowel and bladder  Trouble using call bell also is aphasic, cont efforts at timed toileting, pt now pointing to bathroom 13.  Now with spasticity   Per OT has been affecting balance   Wears WHO, but refuses PRAFO 14.  Acute blood loss anemia  Hemoglobin 11.6 on 11/26  Continue to  monitor  LOS: 18 days A FACE TO FACE EVALUATION WAS PERFORMED  Adamae Ricklefs Karis Juba 10/08/2020, 11:45 AM

## 2020-10-09 ENCOUNTER — Inpatient Hospital Stay (HOSPITAL_COMMUNITY): Payer: Self-pay

## 2020-10-09 DIAGNOSIS — R0989 Other specified symptoms and signs involving the circulatory and respiratory systems: Secondary | ICD-10-CM

## 2020-10-09 NOTE — Progress Notes (Signed)
[facility] PHYSICAL MEDICINE & REHABILITATION PROGRESS NOTE   Subjective/Complaints: Patient seen laying in bed this morning.  He states he slept well overnight.  He is wearing his PRAFO.   ROS- unable to determine due to expressive aphasia.  Objective:   No results found. Recent Labs    10/07/20 0440  WBC 8.9  HGB 11.6*  HCT 36.2*  PLT 348   Recent Labs    10/07/20 0440  NA 141  K 4.1  CL 106  CO2 22  GLUCOSE 102*  BUN 17  CREATININE 1.06  CALCIUM 9.3    Intake/Output Summary (Last 24 hours) at 10/09/2020 1150 Last data filed at 10/09/2020 0731 Gross per 24 hour  Intake 218 ml  Output --  Net 218 ml        Physical Exam: Vital Signs Blood pressure (!) 148/97, pulse 83, temperature 97.9 F (36.6 C), resp. rate 16, height 6' 1.5" (1.867 m), weight 105.5 kg, SpO2 99 %.  Constitutional: No distress . Vital signs reviewed. HENT: Normocephalic.  Atraumatic. Eyes: EOMI. No discharge. Cardiovascular: No JVD.  RRR. Respiratory: Normal effort.  No stridor.  Bilateral clear to auscultation. GI: Non-distended.  BS +. Skin: Warm and dry.  Intact. Psych: Normal mood.  Normal behavior. Musc: No edema in extremities.  No tenderness in extremities. Neurologic: Alert Expressive >> receptive aphasia, stable Motor: RUE: 0/5 proximal to distal RLE: Hip flexion, knee extension 2+/5, distally 0/5   Assessment/Plan: 1. Functional deficits which require 3+ hours per day of interdisciplinary therapy in a comprehensive inpatient rehab setting.  Physiatrist is providing close team supervision and 24 hour management of active medical problems listed below.  Physiatrist and rehab team continue to assess barriers to discharge/monitor patient progress toward functional and medical goals  Care Tool:  Bathing    Body parts bathed by patient: Right arm, Chest, Abdomen, Front perineal area, Left upper leg, Face, Right upper leg   Body parts bathed by helper: Left arm, Buttocks,  Right lower leg, Left lower leg, Right arm     Bathing assist Assist Level: Moderate Assistance - Patient 50 - 74%     Upper Body Dressing/Undressing Upper body dressing   What is the patient wearing?: Pull over shirt    Upper body assist Assist Level: Moderate Assistance - Patient 50 - 74%    Lower Body Dressing/Undressing Lower body dressing      What is the patient wearing?: Pants, Incontinence brief     Lower body assist Assist for lower body dressing: Maximal Assistance - Patient 25 - 49%     Toileting Toileting Toileting Activity did not occur (Clothing management and hygiene only):  (2 assit stedy)  Toileting assist Assist for toileting: Maximal Assistance - Patient 25 - 49%     Transfers Chair/bed transfer  Transfers assist     Chair/bed transfer assist level: Moderate Assistance - Patient 50 - 74%     Locomotion Ambulation   Ambulation assist   Ambulation activity did not occur: Safety/medical concerns  Assist level: Maximal Assistance - Patient 25 - 49% Assistive device: Lite Gait Max distance: 31ft   Walk 10 feet activity   Assist  Walk 10 feet activity did not occur: Safety/medical concerns  Assist level: Maximal Assistance - Patient 25 - 49% Assistive device: Lite Gait   Walk 50 feet activity   Assist Walk 50 feet with 2 turns activity did not occur: Safety/medical concerns         Walk 150 feet  activity   Assist Walk 150 feet activity did not occur: Safety/medical concerns         Walk 10 feet on uneven surface  activity   Assist Walk 10 feet on uneven surfaces activity did not occur: Safety/medical concerns         Wheelchair     Assist Will patient use wheelchair at discharge?: Yes Type of Wheelchair: Manual    Wheelchair assist level: Minimal Assistance - Patient > 75%, Supervision/Verbal cueing Max wheelchair distance: >150 ft    Wheelchair 50 feet with 2 turns activity    Assist         Assist Level: Minimal Assistance - Patient > 75%, Supervision/Verbal cueing   Wheelchair 150 feet activity     Assist      Assist Level: Minimal Assistance - Patient > 75%, Supervision/Verbal cueing   Blood pressure (!) 148/97, pulse 83, temperature 97.9 F (36.6 C), resp. rate 16, height 6' 1.5" (1.867 m), weight 105.5 kg, SpO2 99 %.    Medical Problem List and Plan: 1.  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  Antithrombotics: -DVT/anticoagulation: SCDs             -antiplatelet therapy: N/A 3. Pain Management: Tylenol as needed.   Appears to be controlled on 11/28 4. Mood: Provide emotional support             -antipsychotic agents: N/A 5. Neuropsych: This patient is not capable of making decisions on his own behalf. 6. Skin/Wound Care: Routine skin checks 7. Fluids/Electrolytes/Nutrition: Routine in and outs  BMP within acceptable range on 11/26  8.  Post stroke dysphagia.    D3 honey, advance as tolerated 9.  Hypertension.  Norvasc 10 mg daily, Normodyne 300 mg every 8 hours, lisinopril 20 mg twice daily.  Monitor with increased mobility  Hydralazine decreased to 25 3 times daily Vitals:   10/08/20 2006 10/09/20 0438  BP: 113/72 (!) 148/97  Pulse: 77 83  Resp: 16 16  Temp: 98.4 F (36.9 C) 97.9 F (36.6 C)  SpO2: 94% 99%   Labile on 11/28, monitor for trend 10.  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  Provide counseling when appropriate 11.  Obesity.  BMI 30.67-->31.36-->31.02  Dietary follow-up  12. Incontinence of bowel and bladder  Trouble using call bell also is aphasic, cont efforts at timed toileting, pt now pointing to bathroom 13.  Now with spasticity   Per OT has been affecting balance   Wears WHO, wore PRAFO overnight 14.  Acute blood loss anemia  Hemoglobin 11.6 on 11/26  Continue to  monitor  LOS: 19 days A FACE TO FACE EVALUATION WAS PERFORMED  Ramatoulaye Pack Karis Juba 10/09/2020, 11:50 AM

## 2020-10-09 NOTE — Progress Notes (Signed)
Received a phone call from pts family member stating that "patient called and said something was wrong with him" I let her know I would go check on him so  I checked on patient after the call. He was sitting in bed when I came into the room. I asked pt what was wrong but he struggles to put together words. Attempted to continue to keep patient calm, and take it slow while trying to do the best he could to explain what was going on. The tech was in the room as well and made me aware that patient was not happy after his afternoon physical therapy session today. Patient stated that he felt like the therapist did not communicate well with him, and it was very upsetting to him. Patients mother was in the room and stated "he was laying in bed and wanted to watch football on TV, and wanted to relax". I asked pt if he was tired he said "yeah". Unsure if he was feeling too tired for therapy or if there are other frustrations going on as well because he pointed to his diet written on the board his room, and became frustrated with that.  Continued to try to reassure patient to take things one day at a time and educated regarding diet.    Spoke with PT Lafayette Dragon stated patient was frustrated some during therapy and  there was an incontinent episode of stool and urine  toward the end of the session.    Charge nurse made aware.    Rayburn Ma, LPN

## 2020-10-09 NOTE — Progress Notes (Signed)
Physical Therapy Session Note  Patient Details  Name: Jason Romero MRN: [id number] Date of Birth: [birth year]  Today's Date: 10/09/2020 PT Individual Time: 0802-0830 and 6803-2122 PT Individual Time Calculation (min): 28 min and 84 minutes  Short Term Goals: Week 2:  PT Short Term Goal 1 (Week 2): Pt will perform bed mobility with mod A PT Short Term Goal 1 - Progress (Week 2): Met PT Short Term Goal 2 (Week 2): Pt will propel WC 178f with min using hemi technique PT Short Term Goal 2 - Progress (Week 2): Met PT Short Term Goal 3 (Week 2): Pt will perform squat pivot transfer with mod assist to his R PT Short Term Goal 3 - Progress (Week 2): Met PT Short Term Goal 4 (Week 2): Pt will perform sit<>stand from bed with mod assist PT Short Term Goal 4 - Progress (Week 2): Met  Skilled Therapeutic Interventions/Progress Updates:     1st Session: Pt received supine in bed and agrees to therapy. No complaint of pain. Supine to sit with minA. [place] WC transport to therapy gym for time management. Squat pivot to Nustep with modA. Pt performs Nustep with R leg supported by theraband and using L hemibody, with R hand in lap. PT provides tactile facilitation of neutral positioning of R knee to prevent internal/external hip rotation. Pt completes x4:00 minutes with multiple rests breaks. Squat pivot from Nustep>WC>bed with modA. Sit to suipine with minA. Pt left supine in bed with alarm intact and all needs within reach.  2nd Session: Pt received supine in bed and agrees to therapy. No complaint of pain. Supine to sit with modA and HOB slightly elevated. Squat pivot to WC with modA. WC transport to gym for time management. Pt performs NMR for standing balance and R lower extremity, standing using one parallel bar and facing mirror. Sit to stand requires modA and PT facilitating R knee extension and WB through R leg. In standing, PT blocks pt's R knee though pt is able  prevent knee from buckling ~75% of time. PT provides consistent cues to shift weight toward R due to pt's center of gravity primarily over L hemibody. PT places exercise step under L foot to decrease WB through L lower extremity. Pt performs sit to stand with modA but stands entirely with L leg and dangle R leg in air. PT attempts to cue pt to shift weight to R to WB through R leg but pt is resistant and appears to become frustrated after multiple cues. PT adjusts activity to limit pt's frustration. Also notes that pt's brief is soiled so pt taken back to room. Sit to stand with Stedy and modA. Pt noted to have incontinence of bowel and significant amount of loose stool in brief. PT removes brief prior to pt sitting on BSC positioned in shower. Pt voids urine while seated on BSC and continues to have loose stool that requires shower to clean. Pt able to sit on BSC while PT provides trunk and lower body bathing. Pt able to assist with lower body cleaning and drying with towel. Sit to stand with stedy and transfer back to bed with modA. Sit to supine with modA and PT managing R leg. Pt performs bilateral rolling with minA to don brief and hospital gown. Left supine in bed with R hand splint and R boot in place. Alarm intact and all needs within reach.   Therapy Documentation Precautions:  Precautions Precautions: Fall Precaution Comments:  R hemiplegia, R inattention Restrictions Weight Bearing Restrictions: No    Therapy/Group: Individual Therapy  Breck Coons, PT, DPT 10/09/2020, 4:12 PM

## 2020-10-10 ENCOUNTER — Inpatient Hospital Stay (HOSPITAL_COMMUNITY): Payer: Self-pay | Admitting: Occupational Therapy

## 2020-10-10 ENCOUNTER — Encounter (HOSPITAL_COMMUNITY): Payer: Self-pay | Admitting: Psychology

## 2020-10-10 ENCOUNTER — Inpatient Hospital Stay (HOSPITAL_COMMUNITY): Payer: Self-pay

## 2020-10-10 ENCOUNTER — Inpatient Hospital Stay (HOSPITAL_COMMUNITY): Payer: Self-pay | Admitting: Speech Pathology

## 2020-10-10 NOTE — Consult Note (Signed)
Neuropsychological Consultation   Patient:   Jason Romero   DOB:   Apr 11, 1971  MR Number:  [id number]  Location:  [facility] [address] CENTER A [id number] [place] [address] Dept: [phone] Loc: [phone]           Date of Service:   10/10/2020  Start Time:   9 AM End Time:   10 AM  Provider/Observer:  Arley Phenix, Psy.D.       Clinical Neuropsychologist       Billing Code/Service: 44010  Chief Complaint:    Jason Romero. Jou is a 49 year old male with history of COPD/tobacco abuse with hypertension, history of seizures with no seizure medication, A. fib not on anticoagulation and did not follow-up with cardiology services.  Patient presented on 09/11/2020 with right-sided weakness, facial droop, slumped over in his chair.  Blood pressure was 195/127.  Cranial CT scan showed intraperitoneal hematoma in the left basal ganglia measuring 24 mL.  No shift or hydrocephalus.  Urine drug screen was positive for amphetamines and marijuana.  Patient did admit to marijuana use but denied amphetamine use recently during today's visit.  ICH felt to be related to hypertensive crisis.  Reason for Service:  Patient was referred for neuropsychological consultation due to coping and adjustment following CVA/left ICH and subsequent expressive language deficits and hemiplegia right side.  Below see HPI for the current admission.  Jason Romero a 49 year old right-handed male with history of COPD/tobacco abuse with hypertension, history of seizures on no seizure medication, atrial fibrillation not on anticoagulation did not follow-up cardiology services. Per chart review lives with spouse independent prior to admission. 1 level home with level entry. Presented 09/11/2020 with right side weakness facial droop slumped over in his chair. Blood pressure 195/127. Cranial CT scan showed intraparenchymal hematoma in  the left basal ganglia measuring 24 mL. No shift or hydrocephalus. CTA of head and neck no hemodynamically significant stenosis. Echocardiogram with ejection fraction of 50 to 55% grade 2 diastolic dysfunction. No regional wall motion abnormalities. Admission chemistries unremarkable except glucose 114 potassium 2.8 urine drug screen positive amphetamines and marijuana, urinalysis negative nitrite. Neurology follow-up initially placed on 3% saline. ICH felt to be related to hypertensive crisis. Placed on Cleviprex for blood pressure control. Patient is currently n.p.o. with alternative means of nutritional support. Therapy evaluations completed and patient was admitted for a comprehensive rehab program.  Current Status:  During the clinical interview, the patient reported that he was clear and aware of what it happened to him neurologically with his left ICH and we reviewed issues associated with region/location of this ICH.  The patient appeared to have good receptive language abilities and reported that he was in relatively good mood given the circumstances.  Patient denied any significant depression or anxiety issues but did acknowledge difficulty coping with sudden loss of function.  Patient displayed significant aphasia symptoms including anomia, circumlocutions, conduit d'approache, and agrammatism.  Patient was able to engage his right thigh muscle to some degree but was unable to move his right arm or toes ankles of his right foot.  Patient acknowledged prior understanding of his A. fib and that he had not had any treatment for that.  Patient denied any recent seizure activity.  Behavioral Observation: BLADYN TIPPS  presents as a 49 y.o.-year-old Right Caucasian Male who appeared his stated age. his dress was Appropriate and he was Well Groomed and his manners were Appropriate to  the situation.  his participation was indicative of Appropriate and Attentive behaviors.  There were physical  disabilities noted.  he displayed an appropriate level of cooperation and motivation.     Interactions:    Active Appropriate and Attentive  Attention:   within normal limits and attention span and concentration were age appropriate  Memory:   within normal limits; recent and remote memory intact  Visuo-spatial:  not examined  Speech (Volume):  low  Speech:   non-fluent aphasia; anomia, circumlocutions, conduit d'approche, and agrammatism noted.  Thought Process:  Coherent and Relevant  Though Content:  WNL; not suicidal and not homicidal  Orientation:   person, place, time/date and situation  Judgment:   Fair  Planning:   Fair  Affect:    Appropriate  Mood:    Dysphoric  Insight:   Fair  Intelligence:   normal  Substance Use:  Patient acknowledged recent marijuana use but denied use of amphetamine specifically.  Medical History:   Past Medical History:  Diagnosis Date  . A-fib (HCC)   . Chronic headaches   . COPD (chronic obstructive pulmonary disease) (HCC)   . GERD (gastroesophageal reflux disease)   . History of blood transfusion   . Hypertension   . MI (myocardial infarction) (HCC)   . Seizures (HCC)        Psychiatric History:  No prior psychiatric history  Family Med/Psych History:  Family History  Problem Relation Age of Onset  . Alcoholism Other   . Arthritis Other   . Breast cancer Other   . Heart disease Mother    Impression/DX:  Jason Romero is a 49 year old male with history of COPD/tobacco abuse with hypertension, history of seizures with no seizure medication, A. fib not on anticoagulation and did not follow-up with cardiology services.  Patient presented on 09/11/2020 with right-sided weakness, facial droop, slumped over in his chair.  Blood pressure was 195/127.  Cranial CT scan showed intraperitoneal hematoma in the left basal ganglia measuring 24 mL.  No shift or hydrocephalus.  Urine drug screen was positive for amphetamines and  marijuana.  Patient did admit to marijuana use but denied amphetamine use recently during today's visit.  ICH felt to be related to hypertensive crisis.  During the clinical interview, the patient reported that he was clear and aware of what it happened to him neurologically with his left ICH and we reviewed issues associated with region/location of this ICH.  The patient appeared to have good receptive language abilities and reported that he was in relatively good mood given the circumstances.  Patient denied any significant depression or anxiety issues but did acknowledge difficulty coping with sudden loss of function.  Patient displayed significant aphasia symptoms including anomia, circumlocutions, conduit d'approache, and agrammatism.  Patient was able to engage his right thigh muscle to some degree but was unable to move his right arm or toes ankles of his right foot.  Patient acknowledged prior understanding of his A. fib and that he had not had any treatment for that.  Patient denied any recent seizure activity.   Disposition/Plan:  Will follow up with the patient later this week.  Diagnosis:    Nontraumatic subcortical hemorrhage of left cerebral hemisphere [facility]) - Plan: Ambulatory referral to Neurology         Electronically Signed   _______________________ Arley Phenix, Psy.D.

## 2020-10-10 NOTE — Progress Notes (Signed)
Occupational Therapy Session Note  Patient Details  Name: ZYAIRE MCCLEOD MRN: [id number] Date of Birth: 1971-09-24  Today's Date: 10/10/2020 OT Individual Time: 1300-1416 OT Individual Time Calculation (min): 76 min   Skilled Therapeutic Interventions/Progress Updates:    Pt greeted in bed with no c/o pain, motivated to shower. Pt donned gripper socks with Mod A and then completed bed mobility with close supervision and vcs for Rt attention. Mod A for squat pivot<w/c<bariatric 3:1 in shower. Sit<squat stand completed x2 for lowering clothing with Min A. Note that once pts clothes were doffed he had a loose BM void. Pt reported that he was aware it was coming beforehand. Educated pt to tell staff of his toileting needs prior to voiding so we can get him to a toilet. Pt then bathed while seated, education provided on one handed strategies for washing Lt side with pt needing Min A to implement. He required assistance for reaching the Rt foot and also washing buttocks though pt was able to assist a little while leaning towards his Rt side with drop arm lowered and using the cutout of the BSC. Mod A for squat pivot<w/c where he then engaged in dressing tasks at sit<stand level. Tried to stand using the bariatric RW but the hand orthotic kept coming off when he was transitioning from sit<stand. Therefore used the baseboard of the bed for sit<stands with pt able to support his Rt hand using the Lt hand while OT elevated clothing over hips given CGA-Min balance assist. Min A for donning overhead shirt. Total A for working out the knots in his hair. Mod A for squat pivot>bed and Min A to return to supine. Left him with all needs within reach and hemiplegic side protected. Tx focus placed on adaptive self care skills, activity tolerance, functional transfers, and dynamic sitting balance.   Therapy Documentation Precautions:  Precautions Precautions: Fall Precaution Comments: R hemiplegia, R  inattention Restrictions Weight Bearing Restrictions: No Vital Signs: Therapy Vitals Temp: (!) 97.5 F (36.4 C) Pulse Rate: 88 Resp: 20 BP: (!) 148/92 Patient Position (if appropriate): Lying Oxygen Therapy SpO2: 97 % O2 Device: Room Air ADL: ADL Eating: NPO (CorTrak) Grooming: Minimal assistance Where Assessed-Grooming: Bed level Upper Body Bathing: Moderate assistance, Maximal cueing Where Assessed-Upper Body Bathing: Edge of bed Lower Body Bathing: Moderate assistance Where Assessed-Lower Body Bathing: Bed level Upper Body Dressing: Moderate assistance Where Assessed-Upper Body Dressing: Edge of bed Lower Body Dressing: Maximal assistance, Maximal cueing Where Assessed-Lower Body Dressing: Bed level Toileting: Dependent (Incontinent x2) Where Assessed-Toileting: Bed level Toilet Transfer: Not assessed Tub/Shower Transfer: Not assessed :     Therapy/Group: Individual Therapy  Audrick Lamoureaux A Kumar Falwell 10/10/2020, 4:03 PM

## 2020-10-10 NOTE — Progress Notes (Signed)
Physical Therapy Session Note  Patient Details  Name: Jason Romero MRN: [id number] Date of Birth: 01-08-1971  Today's Date: 10/10/2020 PT Individual Time: 1003-1100 PT Individual Time Calculation (min): 57 min   Short Term Goals: Week 2:  PT Short Term Goal 1 (Week 2): Pt will perform bed mobility with mod A PT Short Term Goal 1 - Progress (Week 2): Met PT Short Term Goal 2 (Week 2): Pt will propel WC 135f with min using hemi technique PT Short Term Goal 2 - Progress (Week 2): Met PT Short Term Goal 3 (Week 2): Pt will perform squat pivot transfer with mod assist to his R PT Short Term Goal 3 - Progress (Week 2): Met PT Short Term Goal 4 (Week 2): Pt will perform sit<>stand from bed with mod assist PT Short Term Goal 4 - Progress (Week 2): Met Week 3:     Skilled Therapeutic Interventions/Progress Updates: Pt presented in bed agreeable to therapy. Pt denies pain during session. TED hose donned total A and pt performed supine to sit with modA and use of bed features. PTA threaded pants and donned shoes total A with pt donning shirt modA. Performed STS from EOB with modA and pt pulled pants over hips with PTA blocking R knee and pt requiring modA to complete task. Pt returned to sitting and performed squat pivot to w/c to R with modA. Pt transported to day room and participated in Lite Gait activities for R NMR and forced use of RLE. Pt participated in gait training on level floor with maxA 230fx 2. Pt was able to initiate advancing RLE and PTA present to block R knee and facilitate wt shifting to R. Pt required max multimodal cues to advance LLE past RLE but was able to perform intermittently. On second bout PTA placed ace bandage for DF assist with pt able to more consistently advance RLE. Pt then participated in Cybex Kinetron at w/c level for reciprocal activity and glute/hamstring activation. Pt performed 20 cycles at 80cm/sec x 2 and then 20 cycles x 2 at 70cm/sec. Pt then propelled via  hemi-technique back to room ~15048fith overall supervision and x 1 verbal cue for safely negotiating around obstacle on R side. Once in room pt requesting to return to bed, performed squat pivot to L with use of bed rail and minA. Pt required minA for sit to supine for BLE management and was able to reposition to comfort with minA. Pt left in bed at end of session with bed alarm on, call bell within reach and needs met.      Therapy Documentation Precautions:  Precautions Precautions: Fall Precaution Comments: R hemiplegia, R inattention Restrictions Weight Bearing Restrictions: No General:   Vital Signs: Therapy Vitals Temp: (!) 97.5 F (36.4 C) Pulse Rate: 88 Resp: 20 BP: (!) 148/92 Patient Position (if appropriate): Lying Oxygen Therapy SpO2: 97 % O2 Device: Room Air Pain:   Mobility:   Locomotion :    Trunk/Postural Assessment :    Balance:   Exercises:   Other Treatments:      Therapy/Group: Individual Therapy  Arty Lantzy 10/10/2020, 4:07 PM

## 2020-10-10 NOTE — Progress Notes (Signed)
Occupational Therapy Weekly Progress Note  Patient Details  Name: ABDULHADI STOPA MRN: [id number] Date of Birth: October 22, 1971  Beginning of progress report period: 09/28/2020 End of progress report period: 10/10/2020  Today's Date: 10/10/2020  Patient has met 3 of 4 short term goals.    At time of report pt can complete shower transfers with Mod A via squat pivot and LB dressing tasks at sit<stand level. Pt continues to exhibit the functional deficits stated below and will benefit from further participation in OT to increase his functional independence before returning home with wife.   Patient continues to demonstrate the following deficits: muscle weakness and muscle paralysis, decreased cardiorespiratoy endurance, abnormal tone, unbalanced muscle activation, decreased coordination and decreased motor planning, decreased attention to right, decreased attention, decreased awareness, decreased problem solving and decreased safety awareness and decreased sitting balance, decreased standing balance, decreased postural control and hemiplegia and therefore will continue to benefit from skilled OT intervention to enhance overall performance with BADL.  Patient progressing toward long term goals..  Continue plan of care.  OT Short Term Goals Week 2:  OT Short Term Goal 1 (Week 2): Patient will maintain static sitting balance at EOB with Min A in prep for ADLs. OT Short Term Goal 1 - Progress (Week 2): Met OT Short Term Goal 2 (Week 2): Patient complete 1/3 parts of LB dressing task seated EOB without lateral LOB. OT Short Term Goal 2 - Progress (Week 2): Progressing toward goal OT Short Term Goal 3 (Week 2): Patient will complete 2/3 parts of UB dressing task with use of hemi techinque. OT Short Term Goal 3 - Progress (Week 2): Met OT Short Term Goal 4 (Week 2): Patient will complete squat-pivot transfers in 2 consecutive treatment sessions with Mod A and no more than 3 cues for safety  awareness. OT Short Term Goal 4 - Progress (Week 2): Met Week 3:  OT Short Term Goal 1 (Week 3): STGs=LTGs set at Mod A overall   Therapy Documentation Precautions:  Precautions Precautions: Fall Precaution Comments: R hemiplegia, R inattention Restrictions Weight Bearing Restrictions: No Vital Signs: Therapy Vitals Temp: 97.6 F (36.4 C) Temp Source: Oral Pulse Rate: 83 Resp: 18 BP: (!) 130/102 Patient Position (if appropriate): Lying Oxygen Therapy SpO2: 94 % O2 Device: Room Air ADL: ADL Eating: NPO (CorTrak) Grooming: Minimal assistance Where Assessed-Grooming: Bed level Upper Body Bathing: Moderate assistance, Maximal cueing Where Assessed-Upper Body Bathing: Edge of bed Lower Body Bathing: Moderate assistance Where Assessed-Lower Body Bathing: Bed level Upper Body Dressing: Moderate assistance Where Assessed-Upper Body Dressing: Edge of bed Lower Body Dressing: Maximal assistance, Maximal cueing Where Assessed-Lower Body Dressing: Bed level Toileting: Dependent (Incontinent x2) Where Assessed-Toileting: Bed level Toilet Transfer: Not assessed Tub/Shower Transfer: Not assessed      Therapy/Group: Individual Therapy  Kristi Hyer A Lizza Huffaker 10/10/2020, 7:27 AM

## 2020-10-10 NOTE — Progress Notes (Signed)
Speech Language Pathology Daily Session Note  Patient Details  Name: Jason Romero MRN: [id number] Date of Birth: 1971-01-07  Today's Date: 10/10/2020 SLP Individual Time: 5361-4431 SLP Individual Time Calculation (min): 55 min  Short Term Goals: Week 3: SLP Short Term Goal 1 (Week 3): Pt will consume therapeutic trials of thin H2O with SLP with minimal overt s/sx aspiration and no appreciable change in daily vitals across 3 sesions to demonstrate readiness repeat insturmental testing. SLP Short Term Goal 2 (Week 3): Pt will consume upgraded trials of regular texture solids with efficient mastication and oral clearance with no more than Min A cues for use of compensatory strategies X2 prior to advancement. SLP Short Term Goal 3 (Week 3): Pt will produce basic CV words with Mod A multimodal cues. SLP Short Term Goal 4 (Week 3): Pt will verbalize high frequency and functional words/short phrases with 75% accuracy provided Mod A cues. SLP Short Term Goal 5 (Week 3): Pt will sustain attention to functional tasks for 10 minutes with Min A cues.  Skilled Therapeutic Interventions: Pt was seen for skilled ST targeting dysphagia and communication goals. Pt had not eaten breakfast upon arrival, therefore SLP assisted with set up and Min A cues for use of compensatory strategies for oral clearance during consumption of dys 3 (mech soft), honey liquid breakfast consumption. No overt s/sx aspiration observed. SLP further facilitated session with trials of thin H2O (self fed cup sips), during which he exhibited 1 immediate cough in ~6oz of consumption. Recommend continue current diet. Pt is making good progress toward repeat MBSS, likely by end of this week. During structured language tasks, pt produced CV and CVC words containing /m/ and /b/ as initial consonant with 4/7 and 5/10 accuracy, respectively, when provided sentence completion cues. He was able to repeat words in attempt to correct incorrect  responses with 25% accuracy, but when provided a written cue for word, able to correct himself verbally with 100% accuracy. Pt continues to express frustration with communication abilities and is very verbose, frequently trying to communicate complex messages, requiring Mod-Max A question cues for clarification, and at times messages are undecipherable. Despite his difficulty and frustration, pt continues to stay motivated and is making some progress. He is aware of verbal errors 95% of the time without need for cueing. Pt left laying in bed with alarm set and needs within reach, RN present. Continue per current plan of care.          Pain Pain Assessment Pain Scale: 0-10 Pain Score: 0-No pain  Therapy/Group: Individual Therapy  [name] 10/10/2020, 9:52 AM

## 2020-10-10 NOTE — Progress Notes (Signed)
[place] PHYSICAL MEDICINE & REHABILITATION PROGRESS NOTE   Subjective/Complaints:  Still aphasic but able to say "pretty good" today   ROS- unable to determine due to expressive aphasia.  Objective:   No results found. No results for input(s): WBC, HGB, HCT, PLT in the last 72 hours. No results for input(s): NA, K, CL, CO2, GLUCOSE, BUN, CREATININE, CALCIUM in the last 72 hours.  Intake/Output Summary (Last 24 hours) at 10/10/2020 1003 Last data filed at 10/10/2020 0900 Gross per 24 hour  Intake 917 ml  Output --  Net 917 ml        Physical Exam: Vital Signs Blood pressure (!) 134/98, pulse 83, temperature 97.6 F (36.4 C), temperature source Oral, resp. rate 18, height 6' 1.5" (1.867 m), weight 105.7 kg, SpO2 94 %.   General: No acute distress Mood and affect are appropriate Heart: Regular rate and rhythm no rubs murmurs or extra sounds Lungs: Clear to auscultation, breathing unlabored, no rales or wheezes Abdomen: Positive bowel sounds, soft nontender to palpation, nondistended Extremities: No clubbing, cyanosis, or edema Skin: No evidence of breakdown, no evidence of rash   Expressive >> receptive aphasia, stable Left side 5/5 in LUE and LLE Motor: RUE: 0/5 proximal to distal RLE: Hip flexion, knee extension 3-/5 distally 0/5   Assessment/Plan: 1. Functional deficits which require 3+ hours per day of interdisciplinary therapy in a comprehensive inpatient rehab setting.  Physiatrist is providing close team supervision and 24 hour management of active medical problems listed below.  Physiatrist and rehab team continue to assess barriers to discharge/monitor patient progress toward functional and medical goals  Care Tool:  Bathing    Body parts bathed by patient: Right arm, Chest, Abdomen, Front perineal area, Left upper leg, Face, Right upper leg   Body parts bathed by helper: Left arm, Buttocks, Right lower leg, Left lower leg, Right arm     Bathing  assist Assist Level: Moderate Assistance - Patient 50 - 74%     Upper Body Dressing/Undressing Upper body dressing   What is the patient wearing?: Pull over shirt    Upper body assist Assist Level: Moderate Assistance - Patient 50 - 74%    Lower Body Dressing/Undressing Lower body dressing      What is the patient wearing?: Pants, Incontinence brief     Lower body assist Assist for lower body dressing: Maximal Assistance - Patient 25 - 49%     Toileting Toileting Toileting Activity did not occur (Clothing management and hygiene only):  (2 assit stedy)  Toileting assist Assist for toileting: Maximal Assistance - Patient 25 - 49%     Transfers Chair/bed transfer  Transfers assist     Chair/bed transfer assist level: Moderate Assistance - Patient 50 - 74%     Locomotion Ambulation   Ambulation assist   Ambulation activity did not occur: Safety/medical concerns  Assist level: Maximal Assistance - Patient 25 - 49% Assistive device: Lite Gait Max distance: 51ft   Walk 10 feet activity   Assist  Walk 10 feet activity did not occur: Safety/medical concerns  Assist level: Maximal Assistance - Patient 25 - 49% Assistive device: Lite Gait   Walk 50 feet activity   Assist Walk 50 feet with 2 turns activity did not occur: Safety/medical concerns         Walk 150 feet activity   Assist Walk 150 feet activity did not occur: Safety/medical concerns         Walk 10 feet on uneven surface  activity   Assist Walk 10 feet on uneven surfaces activity did not occur: Safety/medical concerns         Wheelchair     Assist Will patient use wheelchair at discharge?: Yes Type of Wheelchair: Manual    Wheelchair assist level: Minimal Assistance - Patient > 75%, Supervision/Verbal cueing Max wheelchair distance: >150 ft    Wheelchair 50 feet with 2 turns activity    Assist        Assist Level: Minimal Assistance - Patient > 75%,  Supervision/Verbal cueing   Wheelchair 150 feet activity     Assist      Assist Level: Minimal Assistance - Patient > 75%, Supervision/Verbal cueing   Blood pressure (!) 134/98, pulse 83, temperature 97.6 F (36.4 C), temperature source Oral, resp. rate 18, height 6' 1.5" (1.867 m), weight 105.7 kg, SpO2 94 %.    Medical Problem List and Plan: 1.  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  Antithrombotics: -DVT/anticoagulation: SCDs             -antiplatelet therapy: N/A 3. Pain Management: Tylenol as needed.   Appears to be controlled on 11/28 4. Mood: Provide emotional support             -antipsychotic agents: N/A 5. Neuropsych: This patient is not capable of making decisions on his own behalf. 6. Skin/Wound Care: Routine skin checks 7. Fluids/Electrolytes/Nutrition: Routine in and outs  BMP within acceptable range on 11/26  8.  Post stroke dysphagia.    D3 honey, advance as tolerated 9.  Hypertension.  Norvasc 10 mg daily, Normodyne 300 mg every 8 hours, lisinopril 20 mg twice daily.  Monitor with increased mobility  Hydralazine decreased to 25 3 times daily Vitals:   10/10/20 0442 10/10/20 0854  BP: (!) 130/102 (!) 134/98  Pulse: 83   Resp: 18   Temp: 97.6 F (36.4 C)   SpO2: 94%    Labile on 11/29, monitor for trend 10.  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  Provide counseling when appropriate 11.  Obesity.  BMI 30.67-->31.36-->31.02  Dietary follow-up  12. Incontinence of bowel and bladder  Trouble using call bell also is aphasic, cont efforts at timed toileting, pt now pointing to bathroom 13.  Right spastic hemiplegia   Per OT has been affecting balance   Wears WHO, wore PRAFO overnight 14.  Acute blood loss anemia  Hemoglobin 11.6 on 11/26  Continue to monitor  LOS: 20 days A FACE  TO FACE EVALUATION WAS PERFORMED  Jason Romero 10/10/2020, 10:03 AM

## 2020-10-11 ENCOUNTER — Inpatient Hospital Stay (HOSPITAL_COMMUNITY): Payer: Self-pay | Admitting: *Deleted

## 2020-10-11 ENCOUNTER — Inpatient Hospital Stay (HOSPITAL_COMMUNITY): Payer: Self-pay | Admitting: Speech Pathology

## 2020-10-11 ENCOUNTER — Inpatient Hospital Stay (HOSPITAL_COMMUNITY): Payer: Self-pay | Admitting: Occupational Therapy

## 2020-10-11 NOTE — Progress Notes (Signed)
Patient's wife refused to allow NT to provide full supervision. Stated that she had been doing the supervision.

## 2020-10-11 NOTE — Progress Notes (Addendum)
Speech Language Pathology Daily Session Note  Patient Details  Name: Jason Romero MRN: [id number] Date of Birth: 15-Mar-1971  Today's Date: 10/11/2020 SLP Individual Time: 1359-1455 SLP Individual Time Calculation (min): 56 min  Short Term Goals: Week 3: SLP Short Term Goal 1 (Week 3): Pt will consume therapeutic trials of thin H2O with SLP with minimal overt s/sx aspiration and no appreciable change in daily vitals across 3 sesions to demonstrate readiness repeat insturmental testing. SLP Short Term Goal 2 (Week 3): Pt will consume upgraded trials of regular texture solids with efficient mastication and oral clearance with no more than Min A cues for use of compensatory strategies X2 prior to advancement. SLP Short Term Goal 3 (Week 3): Pt will produce basic CV words with Mod A multimodal cues. SLP Short Term Goal 4 (Week 3): Pt will verbalize high frequency and functional words/short phrases with 75% accuracy provided Mod A cues. SLP Short Term Goal 5 (Week 3): Pt will sustain attention to functional tasks for 10 minutes with Min A cues.  Skilled Therapeutic Interventions: Pt was seen for skilled ST targeting dysphagia and cognitive goals. Pt's wife was present at bedside and receptive to all ongoing education regarding pt's current diet and plan for repeat MBSS by end of week, as well as expressive language strategies/cueing techniques. She demonstrated ability to provide sentence completion and semantic cues to assist pt with word finding and accurate verbal productions throughout session, as well as giving him extra time for responses. Today he produced words to finish phrases with word and picture supports and Min A phonemic cues with 90% accuracy. He then extended to produce the whole phrase with ~75% accuracy with Min-Mod A phonemic cueing in addition to the already provided written and picture supports. Pt also accepted trials of regular texture solids and thin H2O to work toward  advancement and readiness for repeat MBSS. He exhibited 1 immediate cough in ~4oz consumption of thin H2O - this was his first sip and a relatively large bolus. He demonstrated mild right buccal pocketing of regular textures (peanut butter graham crackers), but cleared oral cavity with Min A and extra time for full mastication. Recommend continue current diet for now. Pt left laying in bed with alarm set and needs within reach, wife still present. Continue per current plan of care.          Pain Pain Assessment Pain Scale: 0-10 Pain Score: 0-No pain  Therapy/Group: Individual Therapy  [name] 10/11/2020, 3:02 PM

## 2020-10-11 NOTE — Progress Notes (Signed)
Occupational Therapy Session Note  Patient Details  Name: Jason Romero MRN: [id number] Date of Birth: [birth year]  Today's Date: 10/11/2020 OT Individual Time: 2122-4825 OT Individual Time Calculation (min): 75 min    Short Term Goals: Week 2:  OT Short Term Goal 1 (Week 2): Patient will maintain static sitting balance at EOB with Min A in prep for ADLs. OT Short Term Goal 1 - Progress (Week 2): Met OT Short Term Goal 2 (Week 2): Patient complete 1/3 parts of LB dressing task seated EOB without lateral LOB. OT Short Term Goal 2 - Progress (Week 2): Progressing toward goal OT Short Term Goal 3 (Week 2): Patient will complete 2/3 parts of UB dressing task with use of hemi techinque. OT Short Term Goal 3 - Progress (Week 2): Met OT Short Term Goal 4 (Week 2): Patient will complete squat-pivot transfers in 2 consecutive treatment sessions with Mod A and no more than 3 cues for safety awareness. OT Short Term Goal 4 - Progress (Week 2): Met   Week 3:  OT Short Term Goal 1 (Week 3): STGs=LTGs set at Mod A overall  Skilled Therapeutic Interventions/Progress Updates:    pt received in bed with a wet brief on.  ADL Retraining: from bed level, doffed brief and pt worked on self cleansing of perineal area. Focused on bridging and rolling to don new brief and pants from bed level as no +2 available at the time.  Donned tshirt with mod A from bed with HOB elevated. Pt declined need to sit on toilet as he had just urinated in the brief.    Transfers: focused on rolling to R and pushing up with L arm to sit with min -mod A,  mod A of 1 squat pivot to L to wc and to the R to the bed  Balance: sitting EOB, pt able to hold balance with S  Neuromuscular Re-Education:  Prior to self care, worked on PROM and stretching of RUE with pt supine focusing on scapular mobilizations, elongated stretching with long holds as pt has mod hypertone throughout extremity. Replaced kinesiotape to support subluxation.    After transfer to wc, pt taken to gym to work on PROM and self ROM of RUE with table top towel slides.  Hand over hand A to grasp and release cones by facilitation at wrist.  Pt expressed that he can not feel anything at all in the RUE.    Pt taken back to room and returned to bed to rest. Call light in place, bed alarm on.   Therapy Documentation Precautions:    Precautions Precautions: Fall Precaution Comments: R hemiplegia, R inattention Restrictions Weight Bearing Restrictions: No    Vital Signs: Therapy Vitals BP: 126/74 Pain: Pain Assessment Pain Score: 0-No pain    Therapy/Group: Individual Therapy  [place] 10/11/2020, 12:20 PM

## 2020-10-11 NOTE — Progress Notes (Signed)
Physical Therapy Session Note  Patient Details  Name: Jason Romero MRN: [id number] Date of Birth: September 08, 1971  Today's Date: 10/11/2020 PT Individual Time: 1050-1205 PT Individual Time Calculation (min): 75 min   Short Term Goals: Week 2:  PT Short Term Goal 1 (Week 2): Pt will perform bed mobility with mod A PT Short Term Goal 1 - Progress (Week 2): Met PT Short Term Goal 2 (Week 2): Pt will propel WC 181f with min using hemi technique PT Short Term Goal 2 - Progress (Week 2): Met PT Short Term Goal 3 (Week 2): Pt will perform squat pivot transfer with mod assist to his R PT Short Term Goal 3 - Progress (Week 2): Met PT Short Term Goal 4 (Week 2): Pt will perform sit<>stand from bed with mod assist PT Short Term Goal 4 - Progress (Week 2): Met Week 3:  PT Short Term Goal 1 (Week 3): =LTGs  Skilled Therapeutic Interventions/Progress Updates: Pt presented in bed agreeable to therapy. Pt denies pain during session. Pt noted to be fully dressed from previous session, performed supine to sit with modA for RLE management and truncal support with use of bed features. Pt performed squat pivot to w/c to R with mod nearing minA. Pt then propelled to nsg station ~1026fvia hemi technique with supervision and verbal cues for sequencing and avoiding walls/obstacles on R. Pt then transported to day room and performed STS from w/c with modA and RW with pt participated in toe taps to target with RLE for coordination and forced use of RLE. Pt then set up with Lite Gait and participated in Lite gait activity. PTA placed ace bandage on R foot for DF asssist and secures R hand to handle with ace bandage. Pt ambulated 4267fith maxA but pt able to advance RLE consistently and although PTA was blocking R knee with facilitation and verbal cues pt was able to engage quads in stance phase to minimize knee buckling. Pt noted to perform increased L step through with PTA faciliating wt shifting to R as well. After  seated rest pt ambulated an additional 80f52f same manner. Pt then participated in Cybex Kinetron at 70cm/sec to fatigue (approx 2 min) for general conditioning, and forced use of RLE. Pt transported back to room at end of session and pt agreeable to remain in w/c for lunch with NT notified to return pt to bed after lunch. Pt left in w/c with half lap tray in place, belt alarm on, and wife present. PTA answered several wife's queries and disucssed home set up. Advised due to current mobility status pt would need ramp for home entry with wife verbalizing understanding.    Therapy Documentation Precautions:  Precautions Precautions: Fall Precaution Comments: R hemiplegia, R inattention Restrictions Weight Bearing Restrictions: No General:   Vital Signs: Therapy Vitals Pulse Rate: 72 BP: 128/72 Pain: Pain Assessment Pain Scale: 0-10 Pain Score: 0-No pain   Therapy/Group: Individual Therapy  Lazara Grieser  Armeda Plumb, PTA  10/11/2020, 4:12 PM

## 2020-10-11 NOTE — Progress Notes (Signed)
[place] PHYSICAL MEDICINE & REHABILITATION PROGRESS NOTE   Subjective/Complaints:  No issues overnite  ROS- unable to determine due to expressive aphasia.  Objective:   No results found. No results for input(s): WBC, HGB, HCT, PLT in the last 72 hours. No results for input(s): NA, K, CL, CO2, GLUCOSE, BUN, CREATININE, CALCIUM in the last 72 hours.  Intake/Output Summary (Last 24 hours) at 10/11/2020 0804 Last data filed at 10/10/2020 1300 Gross per 24 hour  Intake 960 ml  Output --  Net 960 ml        Physical Exam: Vital Signs Blood pressure 124/76, pulse 88, temperature 98.3 F (36.8 C), temperature source Oral, resp. rate 18, height 6' 1.5" (1.867 m), weight 104.6 kg, SpO2 97 %.   General: No acute distress Mood and affect are appropriate Heart: Regular rate and rhythm no rubs murmurs or extra sounds Lungs: Clear to auscultation, breathing unlabored, no rales or wheezes Abdomen: Positive bowel sounds, soft nontender to palpation, nondistended Extremities: No clubbing, cyanosis, or edema Skin: No evidence of breakdown, no evidence of rash   Expressive >> receptive aphasia, stable Left side 5/5 in LUE and LLE Motor: RUE: 0/5 distal, 2- RIght shoulder adduction  RLE: Hip flexion, knee extension 3-/5 distally 0/5   Assessment/Plan: 1. Functional deficits which require 3+ hours per day of interdisciplinary therapy in a comprehensive inpatient rehab setting.  Physiatrist is providing close team supervision and 24 hour management of active medical problems listed below.  Physiatrist and rehab team continue to assess barriers to discharge/monitor patient progress toward functional and medical goals  Care Tool:  Bathing    Body parts bathed by patient: Right arm, Chest, Abdomen, Front perineal area, Left upper leg, Face, Right upper leg, Left lower leg   Body parts bathed by helper: Left arm, Buttocks, Right lower leg     Bathing assist Assist Level: Minimal  Assistance - Patient > 75%     Upper Body Dressing/Undressing Upper body dressing   What is the patient wearing?: Pull over shirt    Upper body assist Assist Level: Minimal Assistance - Patient > 75%    Lower Body Dressing/Undressing Lower body dressing      What is the patient wearing?: Pants, Incontinence brief     Lower body assist Assist for lower body dressing: Maximal Assistance - Patient 25 - 49%     Toileting Toileting Toileting Activity did not occur (Clothing management and hygiene only):  (2 assit stedy)  Toileting assist Assist for toileting: Maximal Assistance - Patient 25 - 49%     Transfers Chair/bed transfer  Transfers assist     Chair/bed transfer assist level: Moderate Assistance - Patient 50 - 74%     Locomotion Ambulation   Ambulation assist   Ambulation activity did not occur: Safety/medical concerns  Assist level: Maximal Assistance - Patient 25 - 49% Assistive device: Lite Gait Max distance: 34ft   Walk 10 feet activity   Assist  Walk 10 feet activity did not occur: Safety/medical concerns  Assist level: Maximal Assistance - Patient 25 - 49% Assistive device: Lite Gait   Walk 50 feet activity   Assist Walk 50 feet with 2 turns activity did not occur: Safety/medical concerns         Walk 150 feet activity   Assist Walk 150 feet activity did not occur: Safety/medical concerns         Walk 10 feet on uneven surface  activity   Assist Walk 10 feet on  uneven surfaces activity did not occur: Safety/medical concerns         Wheelchair     Assist Will patient use wheelchair at discharge?: Yes Type of Wheelchair: Manual    Wheelchair assist level: Minimal Assistance - Patient > 75%, Supervision/Verbal cueing Max wheelchair distance: >150 ft    Wheelchair 50 feet with 2 turns activity    Assist        Assist Level: Minimal Assistance - Patient > 75%, Supervision/Verbal cueing   Wheelchair 150  feet activity     Assist      Assist Level: Minimal Assistance - Patient > 75%, Supervision/Verbal cueing   Blood pressure 124/76, pulse 88, temperature 98.3 F (36.8 C), temperature source Oral, resp. rate 18, height 6' 1.5" (1.867 m), weight 104.6 kg, SpO2 97 %.    Medical Problem List and Plan: 1.  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  Antithrombotics: -DVT/anticoagulation: SCDs             -antiplatelet therapy: N/A 3. Pain Management: Tylenol as needed.   Appears to be controlled on 11/30 4. Mood: Provide emotional support             -antipsychotic agents: N/A 5. Neuropsych: This patient is not capable of making decisions on his own behalf. 6. Skin/Wound Care: Routine skin checks 7. Fluids/Electrolytes/Nutrition: Routine in and outs  BMP within acceptable range on 11/26  8.  Post stroke dysphagia.    D3 honey, advance as tolerated 9.  Hypertension.  Norvasc 10 mg daily, Normodyne 300 mg every 8 hours, lisinopril 20 mg twice daily.  Monitor with increased mobility  Hydralazine decreased to 25 3 times daily Vitals:   10/10/20 2020 10/11/20 0516  BP: 112/66 124/76  Pulse: 93 88  Resp: 17 18  Temp: (!) 97.4 F (36.3 C) 98.3 F (36.8 C)  SpO2: 96% 97%   Controlled 11/30 10.  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  Provide counseling when appropriate 11.  Obesity.  BMI 30.67-->31.36-->31.02  Dietary follow-up  12. Incontinence of bowel and bladder  Trouble using call bell also is aphasic, cont efforts at timed toileting, pt now pointing to bathroom 13.  Right spastic hemiplegia  May need oral meds if this worsens  Wears WHO, wore PRAFO overnight 14.  Acute blood loss anemia  Hemoglobin 11.6 on 11/26  Continue to monitor  LOS: 21 days A FACE TO FACE EVALUATION WAS  PERFORMED  Erick Colace 10/11/2020, 8:04 AM

## 2020-10-11 NOTE — Progress Notes (Signed)
Nutrition Follow-up  DOCUMENTATION CODES:   Not applicable  INTERVENTION:   - Continue Magic cup TID with meals, each supplement provides 290 kcal and 9 grams of protein  - Continue double protein portions TID with meals  - Encourage adequate PO intake and provide feeding assistance as needed  - Will monitor for diet advancement and add nutrition supplements as appropriate  NUTRITION DIAGNOSIS:   Inadequate oral intake related to dysphagia as evidenced by NPO status.  Progressing, pt now on dysphagia 3 diet with honey thick liquids  GOAL:   Patient will meet greater than or equal to 90% of their needs  Progressing  MONITOR:   PO intake, Supplement acceptance, Diet advancement, Labs, Weight trends, TF tolerance  REASON FOR ASSESSMENT:   Consult Enteral/tube feeding initiation and management  ASSESSMENT:   49 year old male with PMH of COPD, tobacco abuse, HTN, seizures, atrial fibrillation. Presented 09/11/20 with right-sided weakness and facial droop. Cranial CT scan showed intraparenchymal hematoma in the left basal ganglia. ICH felt to be related to hypertensive crisis. Pt was placed on Cleviprex for blood pressure control. Pt is currently NPO with Cortrak in place for tube feeds. Admitted to CIR on 11/09.  11/17 - MBS, diet advanced to dysphagia 3 with honey-thick liquids 11/18 - Cortrak removed  Noted target d/c date of 12/08.  Spoke with pt and daughter at bedside. Pt reports that he has a good appetite and is eating well. He is looking forward to repeat MBS later this week. Pt has been consuming the Magic Cups on her meal trays.  CIR admit weight: 107.2 kg Current weight: 104.6 kg  Meal Completion: 25-100% (averaging 65%)  Medications reviewed and include: protonix, senna  Labs reviewed.  Diet Order:   Diet Order            DIET DYS 3 Room service appropriate? No; Fluid consistency: Honey Thick  Diet effective now                 EDUCATION  NEEDS:   Education needs have been addressed  Skin:  Skin Assessment: Reviewed RN Assessment  Last BM:  10/11/20  Height:   Ht Readings from Last 1 Encounters:  09/20/20 6' 1.5" (1.867 m)    Weight:   Wt Readings from Last 1 Encounters:  10/11/20 104.6 kg    BMI:  Body mass index is 30.02 kg/m.  Estimated Nutritional Needs:   Kcal:  2300-2500  Protein:  115-135 grams  Fluid:  >/= 2.0 L    Mertie Clause, MS, RD, LDN Inpatient Clinical Dietitian Please see [facility] for contact information.

## 2020-10-12 ENCOUNTER — Inpatient Hospital Stay (HOSPITAL_COMMUNITY): Payer: Self-pay | Admitting: Physical Therapy

## 2020-10-12 ENCOUNTER — Inpatient Hospital Stay (HOSPITAL_COMMUNITY): Payer: Self-pay

## 2020-10-12 ENCOUNTER — Inpatient Hospital Stay (HOSPITAL_COMMUNITY): Payer: Self-pay | Admitting: Speech Pathology

## 2020-10-12 NOTE — Progress Notes (Signed)
Occupational Therapy Session Note  Patient Details  Name: Jason Romero MRN: [id number] Date of Birth: [birth year]  Today's Date: 10/12/2020 OT Individual Time: 2158-7276 OT Individual Time Calculation (min): 40 min    Short Term Goals: Week 3:  OT Short Term Goal 1 (Week 3): STGs=LTGs set at Mod A overall  Skilled Therapeutic Interventions/Progress Updates:    Pt received sitting in the w/c with no c/o pain. Agreeable to OT session. Pt was taken to the therapy gym via w/c. Pt completed a stand pivot transfer to the mat with max A, +2 assist required for safety. Pt completed standing level activity with focus on standing balance, LUE reaching, and RUE weightbearing through RW. Pt required mod facilitation at the R knee 2/2 frequent buckling/instability. Pt given seated rest break. Pt completed squat pivot transfer to his w/c with min A. He was taken to the ADL apt with his wife present for demo re use of TTB in the shower. Pt returned to his room and was left supine with all needs met, bed alarm set.   Therapy Documentation Precautions:  Precautions Precautions: Fall Precaution Comments: R hemiplegia, R inattention Restrictions Weight Bearing Restrictions: No  Therapy/Group: Individual Therapy  Curtis Sites 10/12/2020, 7:27 AM

## 2020-10-12 NOTE — Progress Notes (Signed)
Speech Language Pathology Daily Session Note  Patient Details  Name: Jason Romero MRN: [id number] Date of Birth: July 28, 1971  Today's Date: 10/12/2020 SLP Individual Time: 0730-0827 SLP Individual Time Calculation (min): 57 min  Short Term Goals: Week 3: SLP Short Term Goal 1 (Week 3): Pt will consume therapeutic trials of thin H2O with SLP with minimal overt s/sx aspiration and no appreciable change in daily vitals across 3 sesions to demonstrate readiness repeat insturmental testing. SLP Short Term Goal 2 (Week 3): Pt will consume upgraded trials of regular texture solids with efficient mastication and oral clearance with no more than Min A cues for use of compensatory strategies X2 prior to advancement. SLP Short Term Goal 3 (Week 3): Pt will produce basic CV words with Mod A multimodal cues. SLP Short Term Goal 4 (Week 3): Pt will verbalize high frequency and functional words/short phrases with 75% accuracy provided Mod A cues. SLP Short Term Goal 5 (Week 3): Pt will sustain attention to functional tasks for 10 minutes with Min A cues.  Skilled Therapeutic Interventions: Pt was seen for skilled ST targeting dysphagia and cognitive goals. Upon arrival, pt had not eaten breakfast, therefore set up and Supervision A level verbal cues provided for use of compensatory safe swallow strategies during intake of Dys 3 (mechanical soft) and honey thick liquid breakfast items. One delayed dry cough noted, however did not appear directly tied to intake. Pt also consumed upgraded trials of thin H2O without any overt s/sx aspiration today (across total of ~6oz intake). Recommend continue current diet but repeat MBSS tomorrow (10/13/20) to reassess swallow function and potential for diet advancement. During structured language tasks, pt selected the correct word to finish a sentence with 12/14 accuracy and only Min A sematic cues (in addition to written support already provided by activity). During a phrase  level description task, pt described object functions with mostly 1-3 word responses with Moderate semantic, phonemic, and written cues. Pt indicated that he would like to practice these types of activities prior to ST sessions to increase accuracy, therefore, SLP made copies of appropriate activities and left with pt. Pt left laying in bed with alarm set and needs within reach. Continue per current plan of care.          Pain Pain Assessment Pain Scale: 0-10 Pain Score: 0-No pain  Therapy/Group: Individual Therapy  [name] 10/12/2020, 7:24 AM

## 2020-10-12 NOTE — Progress Notes (Signed)
Physical Therapy Session Note  Patient Details  Name: Jason Romero MRN: [id number] Date of Birth: 1971/05/13  Today's Date: 10/12/2020 PT Individual Time: 0915-1000 PT Individual Time Calculation (min): 45 min   Short Term Goals: Week 3:  PT Short Term Goal 1 (Week 3): =LTGs  Skilled Therapeutic Interventions/Progress Updates: Pt presented in bed with wife present agreeable to therapy. Pt denies pain during session. Pt performed supine to sit with modA due to RLE management and heavy use of bed features. PTA donned TED hose, threaded pants and donned shoes maxA for time management. Pt donned tank top modA and able to maintain good balance performing activities. Pt performed STS with minA from EOB and wife pulled up pants over hips. Pt returned to sitting and performed squat pivot to L as per family set up will be transferring to L OOB at home. Pt performed squat pivot to L with CGA. Pt transported to day room for energy conservation. Pt performed squat pivot to L minA and set up on NuStep. Participated in NuStep L3 x then additional 3 min with all extremities for reciprocal activity, forced use of RLE, and RLE strengthening. Pt indicated 13 on Borg exertion scale. Pt then performed additional x 2 min on L 3 BLE only. Pt then performed squat pivot to L to return to w/c and transported back to room. Pt left in w/c at end of session with half lap tray in place, belt alarm on, call bell within reach and wife present.      Therapy Documentation Precautions:  Precautions Precautions: Fall Precaution Comments: R hemiplegia, R inattention Restrictions Weight Bearing Restrictions: No General:   Vital Signs: Therapy Vitals Temp: 97.6 F (36.4 C) Pulse Rate: 85 Resp: 18 BP: (!) 102/57 Patient Position (if appropriate): Lying Oxygen Therapy SpO2: 97 % O2 Device: Room Air  Therapy/Group: Individual Therapy  Alta Goding  Kayslee Furey, PTA  10/12/2020, 3:57 PM

## 2020-10-12 NOTE — Progress Notes (Signed)
Patient ID: Jason Romero, male   DOB: June 07, 1971, 49 y.o.   MRN: [id number]   Patient DME ordered through [employer].  [place], [place] [phone]

## 2020-10-12 NOTE — Patient Care Conference (Signed)
Inpatient RehabilitationTeam Conference and Plan of Care Update Date: 10/12/2020   Time: 10:02 AM    Patient Name: Jason Romero      Medical Record Number: [id number]  Date of Birth: 05-Apr-1971 Sex: Male         Room/Bed: 4W03C/4W03C-01 Payor Info: Payor: MEDICAID POTENTIAL / Plan: MEDICAID POTENTIAL / Product Type: *No Product type* /    Admit Date/Time:  09/20/2020  4:04 PM  Primary Diagnosis:  ICH (intracerebral hemorrhage) [facility])  Hospital Problems: Principal Problem:   ICH (intracerebral hemorrhage) (HCC) Active Problems:   Acute blood loss anemia   Spastic hemiplegia affecting nondominant side (HCC)   Dysphagia, post-stroke   Labile blood pressure    Expected Discharge Date: Expected Discharge Date: 10/19/20  Team Members Present: Physician leading conference: Dr. Claudette Laws Care Coodinator Present: Chana Bode, RN, BSN, CRRN;Christina Vita Barley, BSW Nurse Present: Magdalen Spatz) Bostic, LPN PT Present: Harless Litten, PTA OT Present: Towanda Malkin, OT SLP Present: Suzzette Righter, CF-SLP PPS Coordinator present : Fae Pippin, [place], PT     Current Status/Progress Goal Weekly Team Focus  Bowel/Bladder   PT incontinent of B/B, knows if he is wet/soiled. LBM 10/11/2020  pt will regain continence of b/b  Q2h toileting and PRN   Swallow/Nutrition/ Hydration   Dys 3 textures, honey thick liquids, full supervision, Min A swallow strategies  Min A  trials of thin, repeat MBSS, trials regular, carryover swallow strategies   ADL's   mod A UB bathing and dressing; max LB b/d, mod A squat pivot to toilet; absent sensation with hypertone of RUE  Min to Mod A  ADL training, functional transfers, R NMR, balance, family education   Mobility   modA bed mobilty, minA squat pivot to L/ mod nearing minA squat pivot to R, supervision w/c mobilty, modA STS, maxA gait in Lite Gait  minA bed mobilitiy, modA transfers, supervision w/c mobility  transfers, standing  balance, R NMR family ed   Communication   Min-Mod A communicating basic wants and needs, Mod-Max A other structured verbal language tasks and accuracy in correcting verbal errors (although very aware of them)  Mod-Min A  functional/high frequency words and phrases, communicating basic wants and needs   Safety/Cognition/ Behavioral Observations  Min A sustained attention and emergent awareness  Min-Supervision  attention during functionla tasks, emergent awareness   Pain   Pt stated no pain during shift  Pain <3/10  Assess Qshift and PRN   Skin   Skin intact  Pt will be free of breakdown and infection.  Assess Qshift and PRN     Discharge Planning:  Discharging home with spouse, sister and mother (Spouse has COPD and sister lives out of town) No previous DME, 1 Level home, level entry   Team Discussion: BP controlled. Remains incontinent of bowel and bladder however noted improved sitting balance and communication.  Patient on target to meet rehab goals: yes, currently CGA to the left for transfers and min -mod assist for stand pivot transfers. Min-mod assist goals set  *See Care Plan and progress notes for long and short-term goals.   Revisions to Treatment Plan:  Written cues  To correct errors and communicate needs. MBS scheduled for 10/13/20 to assess advancement of liquid consistency. Teaching Needs: Transfers, toileting, medications, etc  Current Barriers to Discharge: Decreased caregiver support, Incontinence and hydration  Possible Resolutions to Barriers: Family education scheduled with significant other and mother     Medical Summary Current Status: BPs stabilizing, some  improvement with expressive language  Barriers to Discharge: Incontinence;Medical stability   Possible Resolutions to [employer] Focus: further BP med adjustment as needed, establish means of communication for bladder needs, timed toileting   Continued Need for Acute Rehabilitation Level of  Care: The patient requires daily medical management by a physician with specialized training in physical medicine and rehabilitation for the following reasons: Direction of a multidisciplinary physical rehabilitation program to maximize functional independence : Yes Medical management of patient stability for increased activity during participation in an intensive rehabilitation regime.: Yes Analysis of laboratory values and/or radiology reports with any subsequent need for medication adjustment and/or medical intervention. : Yes   I attest that I was present, lead the team conference, and concur with the assessment and plan of the team.   Chana Bode B 10/12/2020, 2:16 PM

## 2020-10-12 NOTE — Progress Notes (Signed)
Physical Therapy Session Note  Patient Details  Name: Jason Romero MRN: [id number] Date of Birth: May 10, 1971  Today's Date: 10/12/2020 PT Individual Time: 1100-1158 PT Individual Time Calculation (min): 58 min   Short Term Goals: Week 3:  PT Short Term Goal 1 (Week 3): =LTGs  Skilled Therapeutic Interventions/Progress Updates:    Pt received in [facility] with wife at bedside and agreeable to PT. Pt transported to day room for gait training total assist for time management and energy conservation. In day room, pt assisted into lite gait harness in sitting and AFO donning with total assist.  Treadmill Training w/ Lite Gait: -pt participated in 6 min total at 0. (3 min bouts with sitting rest break between). Pt requiring max assist for RLE facilitation, foot placement, and clearance with addition of rigid blue rocker AFO for DF assistance and knee control. Pt also requires assistance with weight shifting onto RLE in order to clear L foot and verbal cueing for "right, left, right, etc" in order to properly sequence steps.  -pt RUE secured on lite gait handholds with ace bandage to promote grip and normalized movement.    WC Propulsion: -pt propelled WC ~170ft using hemi technique and supervision with very minimal cueing in order to avoid obstacles.   Pt left sitting in WC in room with hemi lap tray attached, belt alarm in place, call bell in reach. Wife at bedside upon departure.   Pt performs ~3 sit>stands throughout session with min-mod assist depending on seated position.   Therapy Documentation Precautions:  Precautions Precautions: Fall Precaution Comments: R hemiplegia, R inattention Restrictions Weight Bearing Restrictions: No Pain: Pain Assessment Pain Scale: 0-10 Pain Score: 0-No pain  Therapy/Group: Individual Therapy  Karl Luke , SPT 10/12/2020, 12:04 PM

## 2020-10-12 NOTE — Progress Notes (Signed)
Patient ID: Jason Romero, male   DOB: 1970/11/26, 49 y.o.   MRN: [id number] Team Conference Report to Patient/Family  Team Conference discussion was reviewed with the patient and caregiver, including goals, any changes in plan of care and target discharge date.  Patient and caregiver express understanding and are in agreement.  The patient has a target discharge date of 10/19/20.  [name] 10/12/2020, 1:22 PM

## 2020-10-12 NOTE — Progress Notes (Signed)
[place] PHYSICAL MEDICINE & REHABILITATION PROGRESS NOTE   Subjective/Complaints:  No issues overnight, remains aphasic but occasionally says intelligible word  ROS- unable to determine due to expressive aphasia.  Objective:   No results found. No results for input(s): WBC, HGB, HCT, PLT in the last 72 hours. No results for input(s): NA, K, CL, CO2, GLUCOSE, BUN, CREATININE, CALCIUM in the last 72 hours.  Intake/Output Summary (Last 24 hours) at 10/12/2020 0815 Last data filed at 10/12/2020 0500 Gross per 24 hour  Intake 360 ml  Output --  Net 360 ml        Physical Exam: Vital Signs Blood pressure (!) 135/95, pulse 88, temperature 97.7 F (36.5 C), resp. rate 18, height 6' 1.5" (1.867 m), weight 103.8 kg, SpO2 96 %.   General: No acute distress Mood and affect are appropriate Heart: Regular rate and rhythm no rubs murmurs or extra sounds Lungs: Clear to auscultation, breathing unlabored, no rales or wheezes Abdomen: Positive bowel sounds, soft nontender to palpation, nondistended Extremities: No clubbing, cyanosis, or edema Skin: No evidence of breakdown, no evidence of rash   Expressive >> receptive aphasia, stable Left side 5/5 in LUE and LLE Motor: RUE: 0/5 distal, 2- RIght shoulder adduction, trace biceps  RLE: Hip flexion, knee extension 3-/5 distally 0/5   Assessment/Plan: 1. Functional deficits which require 3+ hours per day of interdisciplinary therapy in a comprehensive inpatient rehab setting.  Physiatrist is providing close team supervision and 24 hour management of active medical problems listed below.  Physiatrist and rehab team continue to assess barriers to discharge/monitor patient progress toward functional and medical goals  Care Tool:  Bathing    Body parts bathed by patient: Right arm, Chest, Abdomen, Front perineal area, Left upper leg, Face, Right upper leg, Left lower leg   Body parts bathed by helper: Left arm, Buttocks, Right lower  leg     Bathing assist Assist Level: Minimal Assistance - Patient > 75%     Upper Body Dressing/Undressing Upper body dressing   What is the patient wearing?: Pull over shirt    Upper body assist Assist Level: Moderate Assistance - Patient 50 - 74%    Lower Body Dressing/Undressing Lower body dressing      What is the patient wearing?: Pants, Incontinence brief     Lower body assist Assist for lower body dressing: Maximal Assistance - Patient 25 - 49%     Toileting Toileting Toileting Activity did not occur (Clothing management and hygiene only):  (2 assit stedy)  Toileting assist Assist for toileting: Maximal Assistance - Patient 25 - 49%     Transfers Chair/bed transfer  Transfers assist     Chair/bed transfer assist level: Moderate Assistance - Patient 50 - 74%     Locomotion Ambulation   Ambulation assist   Ambulation activity did not occur: Safety/medical concerns  Assist level: Maximal Assistance - Patient 25 - 49% Assistive device: Lite Gait Max distance: 17f   Walk 10 feet activity   Assist  Walk 10 feet activity did not occur: Safety/medical concerns  Assist level: Maximal Assistance - Patient 25 - 49% Assistive device: Lite Gait   Walk 50 feet activity   Assist Walk 50 feet with 2 turns activity did not occur: Safety/medical concerns         Walk 150 feet activity   Assist Walk 150 feet activity did not occur: Safety/medical concerns         Walk 10 feet on uneven surface  activity   Assist Walk 10 feet on uneven surfaces activity did not occur: Safety/medical concerns         Wheelchair     Assist Will patient use wheelchair at discharge?: Yes Type of Wheelchair: Manual    Wheelchair assist level: Minimal Assistance - Patient > 75%, Supervision/Verbal cueing Max wheelchair distance: >150 ft    Wheelchair 50 feet with 2 turns activity    Assist        Assist Level: Minimal Assistance - Patient >  75%, Supervision/Verbal cueing   Wheelchair 150 feet activity     Assist      Assist Level: Minimal Assistance - Patient > 75%, Supervision/Verbal cueing   Blood pressure (!) 135/95, pulse 88, temperature 97.7 F (36.5 C), resp. rate 18, height 6' 1.5" (1.867 m), weight 103.8 kg, SpO2 96 %.    Medical Problem List and Plan: 1.  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 Formulized individual treatment plan based on medical history, underlying problem and comorbidities.  2.  Antithrombotics: -DVT/anticoagulation: SCDs             -antiplatelet therapy: N/A 3. Pain Management: Tylenol as needed.   Appears to be controlled on 11/30 4. Mood: Provide emotional support             -antipsychotic agents: N/A 5. Neuropsych: This patient is not capable of making decisions on his own behalf. 6. Skin/Wound Care: Routine skin checks 7. Fluids/Electrolytes/Nutrition: Routine in and outs  BMP within acceptable range on 11/26  8.  Post stroke dysphagia.    D3 honey, advance as tolerated 9.  Hypertension.  Norvasc 10 mg daily, Normodyne 300 mg every 8 hours, lisinopril 20 mg twice daily.  Monitor with increased mobility  Hydralazine decreased to 25 3 times daily Vitals:   10/11/20 1944 10/12/20 0443  BP: 109/83 (!) 135/95  Pulse: 88 88  Resp: 18 18  Temp: 98 F (36.7 C) 97.7 F (36.5 C)  SpO2: 95% 54%   Mild systolic elevation this am but overall control is good  10.  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  Provide counseling when appropriate 11.  Obesity.  BMI 30.67-->31.36-->31.02  Dietary follow-up  12. Incontinence of bowel and bladder  Trouble using call bell also is aphasic,  cont efforts at timed toileting, pt now pointing to bathroom 13.  Right spastic hemiplegia  May need oral meds if this worsens  Wears WHO, wore PRAFO overnight 14.  Acute blood loss anemia  Hemoglobin 11.6 on 11/26  Continue to monitor  LOS: 22 days A FACE TO [place] E Ahmed Inniss 10/12/2020, 8:15 AM

## 2020-10-13 ENCOUNTER — Encounter (HOSPITAL_COMMUNITY): Payer: Self-pay | Admitting: Speech Pathology

## 2020-10-13 ENCOUNTER — Inpatient Hospital Stay (HOSPITAL_COMMUNITY): Payer: Self-pay

## 2020-10-13 ENCOUNTER — Inpatient Hospital Stay (HOSPITAL_COMMUNITY): Payer: Medicaid Other

## 2020-10-13 ENCOUNTER — Ambulatory Visit (HOSPITAL_COMMUNITY): Payer: Self-pay | Admitting: Physical Therapy

## 2020-10-13 ENCOUNTER — Inpatient Hospital Stay (HOSPITAL_COMMUNITY): Payer: Self-pay | Admitting: Speech Pathology

## 2020-10-13 NOTE — Progress Notes (Signed)
Speech Language Pathology Daily Session Note  Patient Details  Name: CRISANTO NIED MRN: [id number] Date of Birth: Jun 05, 1971  Today's Date: 10/13/2020 SLP Individual Time: 0730-0800 SLP Individual Time Calculation (min): 30 min  Short Term Goals: Week 3: SLP Short Term Goal 1 (Week 3): Pt will consume therapeutic trials of thin H2O with SLP with minimal overt s/sx aspiration and no appreciable change in daily vitals across 3 sesions to demonstrate readiness repeat insturmental testing. SLP Short Term Goal 2 (Week 3): Pt will consume upgraded trials of regular texture solids with efficient mastication and oral clearance with no more than Min A cues for use of compensatory strategies X2 prior to advancement. SLP Short Term Goal 3 (Week 3): Pt will produce basic CV words with Mod A multimodal cues. SLP Short Term Goal 4 (Week 3): Pt will verbalize high frequency and functional words/short phrases with 75% accuracy provided Mod A cues. SLP Short Term Goal 5 (Week 3): Pt will sustain attention to functional tasks for 10 minutes with Min A cues.  Skilled Therapeutic Interventions: Pt was seen for skilled ST targeting dysphagia and communication goals. SLP facilitated session with skilled observation and Supervision A level cues for use of compensatory swallow strategies for oral clearance and slow rate during consumption of current diet (dys 3/honey thick liquids) breakfast items. No overt s/sx aspiration observed. Recommend continue current diet. MBSS scheduled for later this morning to instrumentally reassess swallow function. SLP also reviewed 2 sentence completion worksheets pt had completed as homework - answers were 90% accuracy with minimal spelling errors. He communicated with therapist mostly at phrase level regarding his wants and needs and to clarify answers to homework with only Min A question cues from clinician. Pt left laying in bed with alarm set and needs within reach. Continue per  current plan of care.          Pain Pain Assessment Pain Scale: 0-10 Pain Score: 0-No pain  Therapy/Group: Individual Therapy  [name] 10/13/2020, 7:20 AM

## 2020-10-13 NOTE — Progress Notes (Signed)
Modified Barium Swallow Progress Note  Patient Details  Name: Jason Romero MRN: [id number] Date of Birth: Nov 08, 1971  Today's Date: 10/13/2020  Modified Barium Swallow completed.  Full report located under Chart Review in the Imaging Section.  Brief recommendations include the following:  Clinical Impression   Pt presents with moderate oropharyngeal dysphagia, but some improvement in swallow function noted since last MBSS 09/28/20. Thin barium consistently penetrated into the laryngeal vestibule at various levels, sometimes reaching the level of the vocal folds, and in once instance being aspirated prior to swallow initiation. Pt's cough response was not effective to completely clear aspirates and penetrates. He was unable to trigger volitional coughs or extra dry swallows (due to suspected apraxia/motor deficits) to clear penetrates or vallecular residue, which accumulated from lingual residue of thin which then fell into vallecular sinuses after initial swallow. Pt able to effectively use chin tuck maneuver to prevent aspiration of thin barium, although mild penetration above the vocal folds still observed X1. Nectar thick barium was consumed with timely swallow initiation, minimal vallecular residue (less than thin, perhaps due to increased weight of bolus aiding in inferior transit). Pt also demonstrated ability to consume barium pill whole inside bite of puree without oral or pharyngeal abnormalities. However, in brief scan of esophagus, pill was observed to move slowly and stayed in lower esophagus for >2 mins. Recommend pt continue Dys 3 (mechanical soft) solids, upgrade to nectar thick liquids, pills whole in purees one at a time, follow solids with liquid, small bites/sips, slow rate, remain seated upright for at least 30 mins after intake, full supervision to assist with implementation of swallow strategies. Pt may also begin [place] Water Protocol WITH CHIN TUCK. ST will continue to follow  pt to ensure diet safety, efficiency, and skilled interventions to work toward greater independence with swallow strategies and potential diet advancement.    Swallow Evaluation Recommendations       SLP Diet Recommendations: Dysphagia 3 (Mech soft) solids;Nectar thick liquid;Other (Comment) (water protocol with chin tuck)   Liquid Administration via: Cup;Straw   Medication Administration: Whole meds with puree   Supervision: Staff to assist with self feeding;Full supervision/cueing for compensatory strategies   Compensations: Minimize environmental distractions;Small sips/bites;Slow rate;Lingual sweep for clearance of pocketing;Follow solids with liquid;Other (Comment) (must perform chin tuck when doing water protocol, however chin tuck not required with nectar liquids)   Postural Changes: Remain semi-upright after after feeds/meals (Comment);Seated upright at 90 degrees   Oral Care Recommendations: Oral care BID        [name] 10/13/2020,12:26 PM

## 2020-10-13 NOTE — Progress Notes (Signed)
Patient ID: Jason Romero, male   DOB: 12-Jul-1971, 49 y.o.   MRN: [id number]  Patient hospital follow up appointment scheduled with [facility] with [place], [place] on December 29, 2021at 9:30 AM

## 2020-10-13 NOTE — Progress Notes (Signed)
Physical Therapy Session Note  Patient Details  Name: Jason Romero MRN: [id number] Date of Birth: [birth year]  Today's Date: 10/13/2020 PT Individual Time: 6067-7034 PT Individual Time Calculation (min): 70 min   Short Term Goals: Week 3:  PT Short Term Goal 1 (Week 3): =LTGs  Skilled Therapeutic Interventions/Progress Updates:   Pt received supine in bed and agreeable to PT. Supine>sit transfer with min assist and cues for improved awareness of the RLE. Upper and lower body dressing with max assist in sitting due to time management.  PT applied blue rocker AFO And Bil shoes in sitting.   Stand pivot transfer with mod assist and +2 present for safety. Pt able to advance the RLE with assist for improved weight shift and moderate cues for posture and sequencing, no buckling noted in the RLE with transfer.  Gait training in hall with RW x 31f with mod assist and +3 for safety with AD management and WC follow. Pt able to initiate limb advancement as well as knee extension on the R 75% of walk. Step to gait pattern, poor gluteal activation, and poor coordination with RLE foot placement with advancement. Tactile cues for hip/knee flexion/extension as well as terminal knee extension  BWSTT in lite gait x 2 bout 2.5 min at 0.7 mph + and 4.5 min at 0.5 MPH; 565 ft total. Pt noted to have difficulty activating hip extensors on the RLE and saw sustaining severe pelvic retraction on the R side for entire first bout. Improved posture, pelvic rotation, and stance time on the RLE for second bout.     Pre-gait, blocked practice, Stance phase pelvic advancement of the R side while maintaining trunkal elongation to force activation of gluteals and improve pelvic rotation in gait. Completed x 15 with min-mod assist with Lite gait harness in place.   Pt returned to room and performed stand pivot transfer to bed with UE support on the Bed rail and min assist overall. Sit>supine completed with min assist for RLE  management, and left supine in bed with call bell in reach and all needs met.            Therapy Documentation Precautions:  Precautions Precautions: Fall Precaution Comments: R hemiplegia, R inattention Restrictions Weight Bearing Restrictions: No Pain:   denies   Therapy/Group: Individual Therapy  ALorie Phenix12/12/2019, 5:54 PM

## 2020-10-13 NOTE — Progress Notes (Signed)
Occupational Therapy Session Note  Patient Details  Name: Jason Romero MRN: [id number] Date of Birth: August 26, 1971  Today's Date: 10/13/2020 OT Individual Time: 5208-0223 OT Individual Time Calculation (min): 55 min    Short Term Goals: Week 1:  OT Short Term Goal 1 (Week 1): Patient will maintain static sitting balance at EOB with Min A in prep for ADLs. OT Short Term Goal 1 - Progress (Week 1): Progressing toward goal OT Short Term Goal 2 (Week 1): Patient will complete 2/3 parts of UB dressing task with use of hemi techinque. OT Short Term Goal 2 - Progress (Week 1): Progressing toward goal OT Short Term Goal 3 (Week 1): Patient complete 1/3 parts of LB dressing task seated EOB. OT Short Term Goal 3 - Progress (Week 1): Progressing toward goal OT Short Term Goal 4 (Week 1): Patient will complete 2/3 grooming tasks seated at sink level. OT Short Term Goal 4 - Progress (Week 1): Met  Skilled Therapeutic Interventions/Progress Updates:    1:1. Pt received in bed agreeable to OT with wife present. Pt requires squat pivot trasnfer througtou session with MIN-MOD A to strong side. Pt and wife educated on estim. Saebo applied to R shoulder/deltoid in session with edu re estim, decreased sensation impacts, safety, (wife) turning off if uncomfortable, and decreasing risk of sublux. Pt completes seated balance task with tactile cues initially for neutral to anterior pelvic tilt and weight shifting in all 4 directions in mod ranges with good posture. Pt complete lateral lean onto R elbow with OT approximating shoulder with WB for deep input into RUE while reaching and placing cones onto floor, w/c or +2 hand in MAX ranges outside BOS. Exited session with pt seated in bed, exit alarm on and call light in reach   Therapy Documentation Precautions:  Precautions Precautions: Fall Precaution Comments: R hemiplegia, R inattention Restrictions Weight Bearing Restrictions: No General:   Vital  Signs:   Pain:   ADL: ADL Eating: NPO ([employer]) Grooming: Minimal assistance Where Assessed-Grooming: Bed level Upper Body Bathing: Moderate assistance, Maximal cueing Where Assessed-Upper Body Bathing: Edge of bed Lower Body Bathing: Moderate assistance Where Assessed-Lower Body Bathing: Bed level Upper Body Dressing: Moderate assistance Where Assessed-Upper Body Dressing: Edge of bed Lower Body Dressing: Maximal assistance, Maximal cueing Where Assessed-Lower Body Dressing: Bed level Toileting: Dependent (Incontinent x2) Where Assessed-Toileting: Bed level Toilet Transfer: Not assessed Tub/Shower Transfer: Not assessed Vision   Perception    Praxis   Exercises:   Other Treatments:     Therapy/Group: Individual Therapy  [name] 10/13/2020, 2:59 PM

## 2020-10-13 NOTE — Progress Notes (Signed)
Orthopedic Tech Progress Note Patient Details:  Jason Romero 1970-11-26 [id number]  Ordered brace  Patient ID: [name], male   DOB: 1970/12/31, 49 y.o.   MRN: [id number]   Michelle Piper 10/13/2020, 4:43 PM

## 2020-10-13 NOTE — Progress Notes (Signed)
[place] PHYSICAL MEDICINE & REHABILITATION PROGRESS NOTE   Subjective/Complaints:  Occ phrase level speech for socail greeting  ROS- unable to determine due to expressive aphasia.  Objective:   No results found. No results for input(s): WBC, HGB, HCT, PLT in the last 72 hours. No results for input(s): NA, K, CL, CO2, GLUCOSE, BUN, CREATININE, CALCIUM in the last 72 hours.  Intake/Output Summary (Last 24 hours) at 10/13/2020 0828 Last data filed at 10/13/2020 0801 Gross per 24 hour  Intake 716 ml  Output --  Net 716 ml        Physical Exam: Vital Signs Blood pressure 136/90, pulse 92, temperature 97.8 F (36.6 C), temperature source Oral, resp. rate 18, height 6' 1.5" (1.867 m), weight 103.8 kg, SpO2 98 %.   General: No acute distress Mood and affect are appropriate Heart: Regular rate and rhythm no rubs murmurs or extra sounds Lungs: Clear to auscultation, breathing unlabored, no rales or wheezes Abdomen: Positive bowel sounds, soft nontender to palpation, nondistended Extremities: No clubbing, cyanosis, or edema Skin: No evidence of breakdown, no evidence of rash   Expressive >> receptive aphasia, stable Left side 5/5 in LUE and LLE Motor: RUE: 0/5 distal, 2- RIght shoulder adduction, trace biceps  RLE: Hip flexion, knee extension 3-/5 distally 0/5   Assessment/Plan: 1. Functional deficits which require 3+ hours per day of interdisciplinary therapy in a comprehensive inpatient rehab setting.  Physiatrist is providing close team supervision and 24 hour management of active medical problems listed below.  Physiatrist and rehab team continue to assess barriers to discharge/monitor patient progress toward functional and medical goals  Care Tool:  Bathing    Body parts bathed by patient: Right arm, Chest, Abdomen, Front perineal area, Left upper leg, Face, Right upper leg, Left lower leg   Body parts bathed by helper: Left arm, Buttocks, Right lower leg      Bathing assist Assist Level: Minimal Assistance - Patient > 75%     Upper Body Dressing/Undressing Upper body dressing   What is the patient wearing?: Pull over shirt    Upper body assist Assist Level: Moderate Assistance - Patient 50 - 74%    Lower Body Dressing/Undressing Lower body dressing      What is the patient wearing?: Pants, Incontinence brief     Lower body assist Assist for lower body dressing: Maximal Assistance - Patient 25 - 49%     Toileting Toileting Toileting Activity did not occur (Clothing management and hygiene only):  (2 assit stedy)  Toileting assist Assist for toileting: Maximal Assistance - Patient 25 - 49%     Transfers Chair/bed transfer  Transfers assist     Chair/bed transfer assist level: Moderate Assistance - Patient 50 - 74%     Locomotion Ambulation   Ambulation assist   Ambulation activity did not occur: Safety/medical concerns  Assist level: Maximal Assistance - Patient 25 - 49% Assistive device: Lite Gait Max distance: 40ft   Walk 10 feet activity   Assist  Walk 10 feet activity did not occur: Safety/medical concerns  Assist level: Maximal Assistance - Patient 25 - 49% Assistive device: Lite Gait   Walk 50 feet activity   Assist Walk 50 feet with 2 turns activity did not occur: Safety/medical concerns         Walk 150 feet activity   Assist Walk 150 feet activity did not occur: Safety/medical concerns         Walk 10 feet on uneven surface  activity  Assist Walk 10 feet on uneven surfaces activity did not occur: Safety/medical concerns         Wheelchair     Assist Will patient use wheelchair at discharge?: Yes Type of Wheelchair: Manual    Wheelchair assist level: Minimal Assistance - Patient > 75%, Supervision/Verbal cueing Max wheelchair distance: >150 ft    Wheelchair 50 feet with 2 turns activity    Assist        Assist Level: Minimal Assistance - Patient > 75%,  Supervision/Verbal cueing   Wheelchair 150 feet activity     Assist      Assist Level: Minimal Assistance - Patient > 75%, Supervision/Verbal cueing   Blood pressure 136/90, pulse 92, temperature 97.8 F (36.6 C), temperature source Oral, resp. rate 18, height 6' 1.5" (1.867 m), weight 103.8 kg, SpO2 98 %.    Medical Problem List and Plan: 1.  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  Antithrombotics: -DVT/anticoagulation: SCDs             -antiplatelet therapy: N/A 3. Pain Management: Tylenol as needed.   Appears to be controlled on 11/30 4. Mood: Provide emotional support             -antipsychotic agents: N/A 5. Neuropsych: This patient is not capable of making decisions on his own behalf. 6. Skin/Wound Care: Routine skin checks 7. Fluids/Electrolytes/Nutrition: Routine in and outs  BMP within acceptable range on 11/26  8.  Post stroke dysphagia.    D3 honey, advance as tolerated 9.  Hypertension.  Norvasc 10 mg daily, Normodyne 300 mg every 8 hours, lisinopril 20 mg twice daily.  Monitor with increased mobility  Hydralazine decreased to 25 3 times daily Vitals:   10/12/20 2106 10/13/20 0451  BP: 106/66 136/90  Pulse:  92  Resp:  18  Temp:  97.8 F (36.6 C)  SpO2:  98%    10.  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  Provide counseling when appropriate 11.  Obesity.  BMI 30.67-->31.36-->31.02  Dietary follow-up  12. Incontinence of bowel and bladder  Trouble using call bell also is aphasic, cont efforts at timed toileting, pt now pointing to bathroom 13.  Right spastic hemiplegia  May need oral meds if this worsens  Wears WHO, wore PRAFO overnight 14.  Acute blood loss anemia  Hemoglobin 11.6 on 11/26  Continue to monitor  LOS: 23 days A FACE TO FACE EVALUATION WAS PERFORMED  Erick Colace 10/13/2020, 8:28 AM

## 2020-10-14 ENCOUNTER — Encounter (HOSPITAL_COMMUNITY): Payer: Self-pay | Admitting: Speech Pathology

## 2020-10-14 ENCOUNTER — Encounter (HOSPITAL_COMMUNITY): Payer: Self-pay | Admitting: Occupational Therapy

## 2020-10-14 ENCOUNTER — Ambulatory Visit (HOSPITAL_COMMUNITY): Payer: Self-pay

## 2020-10-14 ENCOUNTER — Inpatient Hospital Stay (HOSPITAL_COMMUNITY): Payer: Self-pay

## 2020-10-14 NOTE — Progress Notes (Signed)
[place] PHYSICAL MEDICINE & REHABILITATION PROGRESS NOTE   Subjective/Complaints:  Family training in progress for OT.toilet transfers, discussed importance of compliance BP meds with pt and family Pt has no PCP  ROS- unable to determine due to expressive aphasia.  Objective:   DG Swallowing Func-Speech Pathology  Result Date: 10/13/2020 Objective Swallowing Evaluation: Type of Study: MBS-Modified Barium Swallow Study  Patient Details Name: Jason Romero MRN: [id number] Date of Birth: [birth year] Today's Date: 10/13/2020 Past Medical History: Past Medical History: Diagnosis Date  A-fib [facility](HCC)   Chronic headaches   COPD (chronic obstructive pulmonary disease) (HCC)   GERD (gastroesophageal reflux disease)   History of blood transfusion   Hypertension   MI (myocardial infarction) (HCC)   Seizures (HCC)  Past Surgical History: Past Surgical History: Procedure Laterality Date  CHOLECYSTECTOMY   HPI: The pt is a 49 yo male presenting with R-sided weakness and found to have intraparenchymal hematoma in the left basal ganglia on original CT. Repeat imaging revealed increased hemorrhage with 3mm midline shift. PMH includes: GERD, seizures, HTN, COPD, MI, afib, and current tobacco use. Admitted to CIR 09/20/20, previous MBS 09/16/20 and 09/28/20.  Assessment / Plan / Recommendation CH[facility] IP CLINICAL IMPRESSIONS 10/13/2020 Clinical Impression --Pt presents with moderate oropharyngeal dysphagia, but some improvement in swallow function noted since last MBSS 09/28/20. Thin barium consistently penetrated into the laryngeal vestibule at various levels, sometimes reaching the level of the vocal folds, and in once instance being aspirated prior to swallow initiation. Pt's cough response was not effective to completely clear aspirates and penetrates. He was unable to trigger volitional coughs or extra dry swallows (due to suspected apraxia/motor deficits) to clear penetrates or vallecular residue, which  accumulated from lingual residue of thin which then fell into vallecular sinuses after initial swallow. Pt able to effectively use chin tuck maneuver to prevent aspiration of thin barium, although mild penetration above the vocal folds still observed X1. Nectar thick barium was consumed with timely swallow initiation, minimal vallecular residue (less than thin, perhaps due to increased weight of bolus aiding in inferior transit). Pt also demonstrated ability to consume barium pill whole inside bite of puree without oral or pharyngeal abnormalities. However, in brief scan of esophagus, pill was observed to move slowly and stayed in lower esophagus for >2 mins. Recommend pt continue Dys 3 (mechanical soft) solids, upgrade to nectar thick liquids, pills whole in purees one at a time, follow solids with liquid, small bites/sips, slow rate, remain seated upright for at least 30 mins after intake, full supervision to assist with implementation of swallow strategies. Pt may also begin [place] Water Protocol WITH CHIN TUCK. ST will continue to follow pt to ensure diet safety, efficiency, and skilled interventions to work toward greater independence with swallow strategies and potential diet advancement.  SLP Visit Diagnosis Dysphagia, oropharyngeal phase (R13.12);Aphasia (R47.01) Attention and concentration deficit following -- Frontal lobe and executive function deficit following -- Impact on safety and function Moderate aspiration risk   CHL IP TREATMENT RECOMMENDATION 09/16/2020 Treatment Recommendations Therapy as outlined in treatment plan below   Prognosis 09/16/2020 Prognosis for Safe Diet Advancement Good Barriers to Reach Goals Cognitive deficits;Language deficits Barriers/Prognosis Comment -- [facility] IP DIET RECOMMENDATION 10/13/2020 SLP Diet Recommendations Dysphagia 3 (Mech soft) solids;Nectar thick liquid;Other (Comment) Liquid Administration via Cup;Straw Medication Administration Whole meds with puree Compensations  Minimize environmental distractions;Small sips/bites;Slow rate;Lingual sweep for clearance of pocketing;Follow solids with liquid;Other (Comment) Postural Changes Remain semi-upright after after feeds/meals (Comment);Seated upright at 90  degrees   CHL IP OTHER RECOMMENDATIONS 10/13/2020 Recommended Consults -- Oral Care Recommendations Oral care BID Other Recommendations --   [facility] IP FOLLOW UP RECOMMENDATIONS 09/19/2020 Follow up Recommendations Inpatient Rehab   CH[facility] IP FREQUENCY AND DURATION 09/16/2020 Speech Therapy Frequency (ACUTE ONLY) min 2x/week Treatment Duration 2 weeks      CH[facility] IP ORAL PHASE 10/13/2020 Oral Phase Impaired Oral - Pudding Teaspoon -- Oral - Pudding Cup -- Oral - Honey Teaspoon NT Oral - Honey Cup NT Oral - Nectar Teaspoon NT Oral - Nectar Cup Lingual/palatal residue Oral - Nectar Straw WFL Oral - Thin Teaspoon NT Oral - Thin Cup Decreased bolus cohesion;Piecemeal swallowing;Lingual/palatal residue Oral - Thin Straw -- Oral - Puree NT Oral - Mech Soft NT Oral - Regular -- Oral - Multi-Consistency -- Oral - Pill WFL Oral Phase - Comment --  CHL IP PHARYNGEAL PHASE 10/13/2020 Pharyngeal Phase Impaired Pharyngeal- Pudding Teaspoon -- Pharyngeal -- Pharyngeal- Pudding Cup -- Pharyngeal -- Pharyngeal- Honey Teaspoon NT Pharyngeal -- Pharyngeal- Honey Cup NT Pharyngeal -- Pharyngeal- Nectar Teaspoon NT Pharyngeal -- Pharyngeal- Nectar Cup Pharyngeal residue - valleculae Pharyngeal Material does not enter airway Pharyngeal- Nectar Straw Delayed swallow initiation-vallecula Pharyngeal -- Pharyngeal- Thin Teaspoon NT Pharyngeal -- Pharyngeal- Thin Cup Delayed swallow initiation-vallecula;Penetration/Aspiration before swallow;Pharyngeal residue - valleculae;Reduced airway/laryngeal closure;Compensatory strategies attempted (with notebox) Pharyngeal Material enters airway, passes BELOW cords and not ejected out despite cough attempt by patient;Material enters airway, CONTACTS cords and not ejected  out;Material enters airway, remains ABOVE vocal cords and not ejected out Pharyngeal- Thin Straw -- Pharyngeal -- Pharyngeal- Puree NT Pharyngeal -- Pharyngeal- Mechanical Soft NT Pharyngeal -- Pharyngeal- Regular -- Pharyngeal -- Pharyngeal- Multi-consistency -- Pharyngeal -- Pharyngeal- Pill WFL Pharyngeal -- Pharyngeal Comment --  CHL IP CERVICAL ESOPHAGEAL PHASE 10/13/2020 Cervical Esophageal Phase WFL Pudding Teaspoon -- Pudding Cup -- Honey Teaspoon -- Honey Cup -- Nectar Teaspoon -- Nectar Cup -- Nectar Straw -- Thin Teaspoon -- Thin Cup -- Thin Straw -- Puree -- Mechanical Soft -- Regular -- Multi-consistency -- Pill -- Cervical Esophageal Comment -- [name] 10/13/2020, 12:27 PM              No results for input(s): WBC, HGB, HCT, PLT in the last 72 hours. No results for input(s): NA, K, CL, CO2, GLUCOSE, BUN, CREATININE, CALCIUM in the last 72 hours.  Intake/Output Summary (Last 24 hours) at 10/14/2020 0830 Last data filed at 10/13/2020 2130 Gross per 24 hour  Intake 286 ml  Output --  Net 286 ml        Physical Exam: Vital Signs Blood pressure (!) 141/83, pulse 90, temperature 98.9 F (37.2 C), resp. rate 18, height 6' 1.5" (1.867 m), weight 104.5 kg, SpO2 98 %.    General: No acute distress Mood and affect are appropriate Heart: Regular rate and rhythm no rubs murmurs or extra sounds Lungs: Clear to auscultation, breathing unlabored, no rales or wheezes Abdomen: Positive bowel sounds, soft nontender to palpation, nondistended Extremities: No clubbing, cyanosis, or edema Skin: No evidence of breakdown, no evidence of rash  Expressive >> receptive aphasia, improving , able to name simple objects Left side 5/5 in LUE and LLE Motor: RUE: 0/5 distal, 2- RIght shoulder adduction, trace biceps  RLE: Hip flexion, knee extension 3-/5 distally 0/5   Assessment/Plan: 1. Functional deficits which require 3+ hours per day of interdisciplinary therapy in a comprehensive inpatient  rehab setting.  Physiatrist is providing close team supervision and 24 hour management of active  medical problems listed below.  Physiatrist and rehab team continue to assess barriers to discharge/monitor patient progress toward functional and medical goals  Care Tool:  Bathing    Body parts bathed by patient: Right arm, Chest, Abdomen, Front perineal area, Left upper leg, Face, Right upper leg, Left lower leg   Body parts bathed by helper: Left arm, Buttocks, Right lower leg     Bathing assist Assist Level: Minimal Assistance - Patient > 75%     Upper Body Dressing/Undressing Upper body dressing   What is the patient wearing?: Pull over shirt    Upper body assist Assist Level: Moderate Assistance - Patient 50 - 74%    Lower Body Dressing/Undressing Lower body dressing      What is the patient wearing?: Pants, Incontinence brief     Lower body assist Assist for lower body dressing: Maximal Assistance - Patient 25 - 49%     Toileting Toileting Toileting Activity did not occur (Clothing management and hygiene only):  (2 assit stedy)  Toileting assist Assist for toileting: Maximal Assistance - Patient 25 - 49%     Transfers Chair/bed transfer  Transfers assist     Chair/bed transfer assist level: Moderate Assistance - Patient 50 - 74%     Locomotion Ambulation   Ambulation assist   Ambulation activity did not occur: Safety/medical concerns  Assist level: Maximal Assistance - Patient 25 - 49% Assistive device: Lite Gait Max distance: 23ft   Walk 10 feet activity   Assist  Walk 10 feet activity did not occur: Safety/medical concerns  Assist level: Maximal Assistance - Patient 25 - 49% Assistive device: Lite Gait   Walk 50 feet activity   Assist Walk 50 feet with 2 turns activity did not occur: Safety/medical concerns         Walk 150 feet activity   Assist Walk 150 feet activity did not occur: Safety/medical concerns         Walk 10  feet on uneven surface  activity   Assist Walk 10 feet on uneven surfaces activity did not occur: Safety/medical concerns         Wheelchair     Assist Will patient use wheelchair at discharge?: Yes Type of Wheelchair: Manual    Wheelchair assist level: Minimal Assistance - Patient > 75%, Supervision/Verbal cueing Max wheelchair distance: >150 ft    Wheelchair 50 feet with 2 turns activity    Assist        Assist Level: Minimal Assistance - Patient > 75%, Supervision/Verbal cueing   Wheelchair 150 feet activity     Assist      Assist Level: Minimal Assistance - Patient > 75%, Supervision/Verbal cueing   Blood pressure (!) 141/83, pulse 90, temperature 98.9 F (37.2 C), resp. rate 18, height 6' 1.5" (1.867 m), weight 104.5 kg, SpO2 98 %.    Medical Problem List and Plan: 1.  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  Antithrombotics: -DVT/anticoagulation: SCDs             -antiplatelet therapy: N/A 3. Pain Management: Tylenol as needed.   Appears to be controlled on 11/30 4. Mood: Provide emotional support             -antipsychotic agents: N/A 5. Neuropsych: This patient is not capable of making decisions on his own behalf. 6. Skin/Wound Care: Routine skin checks 7. Fluids/Electrolytes/Nutrition: Routine in and  outs  BMP within acceptable range on 11/26  8.  Post stroke dysphagia.    D3 honey, advance as tolerated 9.  Hypertension.  Norvasc 10 mg daily, Normodyne 300 mg every 8 hours, lisinopril 20 mg twice daily.  Monitor with increased mobility  Hydralazine decreased to 25 3 times daily Vitals:   10/13/20 1942 10/14/20 0438  BP: 102/71 (!) 141/83  Pulse: 78 90  Resp: 18 18  Temp: 98.1 F (36.7 C) 98.9 F (37.2 C)  SpO2: 97% 98%   Some lability no med changes 10.  History of tobacco abuse as well as marijuana use.Also amphetamine use   Urine drug screen positive marijuana.  Provide counseling when appropriate 11.  Obesity.  BMI 30.67-->31.36-->31.02  Dietary follow-up  12. Incontinence of bowel and bladder  Trouble using call bell also is aphasic, cont efforts at timed toileting, pt now pointing to bathroom 13.  Right spastic hemiplegia  May need oral meds if this worsens  Wears WHO, wore PRAFO overnight 14.  Acute blood loss anemia  Hemoglobin 11.6 on 11/26  Continue to monitor  LOS: 24 days A FACE TO FACE EVALUATION WAS PERFORMED  Erick Colace 10/14/2020, 8:30 AM

## 2020-10-14 NOTE — Progress Notes (Signed)
Speech Language Pathology Weekly Progress and Session Note  Patient Details  Name: Jason Romero MRN: [id number] Date of Birth: 11-Sep-1971  Beginning of progress report period: October 07, 2020 End of progress report period: October 14, 2020  Today's Date: 10/14/2020 SLP Individual Time: 1030-1100 SLP Individual Time Calculation (min): 30 min  Short Term Goals: Week 3: SLP Short Term Goal 1 (Week 3): Pt will consume therapeutic trials of thin H2O with SLP with minimal overt s/sx aspiration and no appreciable change in daily vitals across 3 sesions to demonstrate readiness repeat insturmental testing. SLP Short Term Goal 1 - Progress (Week 3): Met SLP Short Term Goal 2 (Week 3): Pt will consume upgraded trials of regular texture solids with efficient mastication and oral clearance with no more than Min A cues for use of compensatory strategies X2 prior to advancement. SLP Short Term Goal 2 - Progress (Week 3): Progressing toward goal SLP Short Term Goal 3 (Week 3): Pt will produce basic CV words with Mod A multimodal cues. SLP Short Term Goal 3 - Progress (Week 3): Met SLP Short Term Goal 4 (Week 3): Pt will verbalize high frequency and functional words/short phrases with 75% accuracy provided Mod A cues. SLP Short Term Goal 4 - Progress (Week 3): Met SLP Short Term Goal 5 (Week 3): Pt will sustain attention to functional tasks for 10 minutes with Min A cues. SLP Short Term Goal 5 - Progress (Week 3): Met    New Short Term Goals: Week 4: SLP Short Term Goal 1 (Week 4): STG=LTG due to remaining length of stay  Weekly Progress Updates: Pt has made functional gains and met 4 out of 5 short term goals this reporting period. Pt is currently Mod assist for functional communication due to severe expressive aphasia, but it making excellent progress toward expanding and correcting at the phrase and sentence level. He is highly receptive to written cues, and occasionally phonemic. Pt participated  in a repeat MBSS this week, and was upgraded to nectar thick liquids and put on water protocol with a chin tuck. He is still consuming mechanical soft (dys 3) solids and medications whole in puree. Pt is still moderately impulsive and distractible, but improving and only requires ~Min A cueing for said skills during sessions. Pt and family education is ongoing. Pt would continue to benefit from skilled ST while inpatient in order to maximize functional independence and reduce burden of care prior to discharge. Anticipate that pt will need 24/7 supervision at discharge in addition to [place] follow up at next level of care.       Intensity: Minumum of 1-2 x/day, 30 to 90 minutes Frequency: 3 to 5 out of 7 days Duration/Length of Stay: 10/19/20 Treatment/Interventions: Cognitive remediation/compensation;Cueing hierarchy;Dysphagia/aspiration precaution training;Functional tasks;Internal/external aids;Multimodal communication approach;Speech/Language facilitation;Therapeutic Activities;Patient/family education   Daily Session  Skilled Therapeutic Interventions: Pt was seen for skilled ST targeting education with pt and his primary caregivers (wife and mom). SLP provided handouts, verbal explanation, and demonstration regarding current diet recommendations (dys 3 solids, nectar liquids, water protocol). Demonstrated how to use powdered thickener to thicken liquids to recommended nectar consistency and compared to pre-thickened liquid. Also discussed water protocol being implemented now and potential for it to be utilized at home at d/c pending that pt tolerates it from a respiratory standout for remainder of his stay. Pt demonstrated how to accurately perform a chin tuck, which he must use when consuming thin H2O in accordance with the water protocol. Reviewed all of  pt's current recommended compensatory swallow strategies, and recommendation for supervision during meals (in addition to 24/7 supervision). Given  time constraints, education regarding communication and cognitive status not covered today, but encouraged family to attend additional education sessions on Saturday and early next week. Pt and family in agreement. Pt left sitting in chair with alarm set and needs within reach. Continue per current plan of care.       Pain Pain Assessment Pain Scale: 0-10 Pain Score: 0-No pain  Therapy/Group: Individual Therapy  Arbutus Leas 10/14/2020, 10:27 AM

## 2020-10-14 NOTE — Progress Notes (Signed)
Physical Therapy Session Note  Patient Details  Name: Jason Romero MRN: [id number] Date of Birth: 03/20/1971  Today's Date: 10/14/2020 PT Individual Time: 1345-1440 PT Individual Time Calculation (min): 55 min   Short Term Goals: Week 2:  PT Short Term Goal 1 (Week 2): Pt will perform bed mobility with mod A PT Short Term Goal 1 - Progress (Week 2): Met PT Short Term Goal 2 (Week 2): Pt will propel WC 161f with min using hemi technique PT Short Term Goal 2 - Progress (Week 2): Met PT Short Term Goal 3 (Week 2): Pt will perform squat pivot transfer with mod assist to his R PT Short Term Goal 3 - Progress (Week 2): Met PT Short Term Goal 4 (Week 2): Pt will perform sit<>stand from bed with mod assist PT Short Term Goal 4 - Progress (Week 2): Met Week 3:  PT Short Term Goal 1 (Week 3): =LTGs  Skilled Therapeutic Interventions/Progress Updates:   Pt requires min assist to come to EOB with focus on management of RUE and RLE with cues needed for attention. AFO was already donned by orthotist prior to start of session (and shoes) for assessment of AFO during gait. Mod assist for sit <> stand with RW with cues for hand placement and management with hand orthosis on RW. +2 for safety with stand step transfer and facilitation of weightshift needed and assist with placement of RLE. In hallway, performed NMR during gait trial with assessment of AFO ([place]) with orthotist (Gerald Stabsfrom [facility] present with +2 for safety with mod assist x 25' and some assist with RLE advancement and placement due to decreased hip flexion and facilitation needed for complete weightshift. Noted toe drag on R and difficulty with clearance. Orthotist recommending heel lift (3/8" and toe cap on R and shoe lift on L). He took R shoe for adjustments with plan to return later today or over the weekend.   Rest of session focused on blocked practice NMR during squat pivot transfers and scooting EOB with focus on sustaining  squat and technique for weightshift and forced use of RLE. Pt demonstrates activity with overall min to mod assist and facilitation and cues for appropriate weightshifting. Performed mod assist transfer back to bed with squat pivot technique and cues as described above. Min/mod assist to return to supine with RUE elevated and positioned on pillows for support.   Therapy Documentation Precautions:  Precautions Precautions: Fall Precaution Comments: R hemiplegia, R inattention Restrictions Weight Bearing Restrictions: No  Pain: Does not complain of pain .    Therapy/Group: Individual Therapy  GCanary BrimBIvory Broad PT, DPT, CBIS  10/14/2020, 3:34 PM

## 2020-10-14 NOTE — Progress Notes (Signed)
Physical Therapy Session Note  Patient Details  Name: Jason Romero MRN: [id number] Date of Birth: 10-27-1971  Today's Date: 10/14/2020 PT Individual Time: 0945-1030 PT Individual Time Calculation (min): 45 min   Short Term Goals: Week 3:  PT Short Term Goal 1 (Week 3): =LTGs  Skilled Therapeutic Interventions/Progress Updates:    pt received sitting up in wc with family (wife, mother) present for family education. Pt transported in wc to ortho gym to practice transfers with family to and from the mat table as well at the car. Family was able to practice swing pivot transfers with OT in earlier session, therefore asked to recall at [facility] table prior to attempting car transfer. All wc parts were introduced and family was educated about removing leg rests, arm rests, and locking brakes. Wife assisted with swing pivot with proper form and min assist in both directions. Pt and family educated about the need for proper setup prior to attempting any transfer, especially due to pt's continual mild impulsivity. Pt educated about having patience with family members as they are learning to complete transfers and assist him with care.   Car Transfer: -completed with PT x2, then family x1.  -pt requiring +2 with transfer due to gap between wc and car seat and needs mod assist for RLE management. Pt has noted difficulty clearing his bottom, especially with different seat heights. Pt has greater difficulty when entering the car as he fears hitting his head and has difficulty maintaining squat position for lengthened periods of time. Pt educated about hand placement and importance of scooting to the edge of his seat prior to attempting transfer.  -with family: +2 for family - pt continues to need cueing for sequencing and hand placement. At this time, family needing continual practice with car transfer. Discussed importance of practicing with a real car vs simulated car for increased safety. Family agreeable to  attempt bringing car in Mon/Tues afternoon for more realistic practice.   Family expressed concerns about their stairs (4 STE) and how they would be able to help him enter the home. Educated about the possibility of wc bumping, however unable to demo/practice at this time. Will demo/practice at later session.   Pt transported to room in wc and left sitting up, belt alarm on, call bell in reach and needs met. No pain reported at this time.   Therapy Documentation Precautions:  Precautions Precautions: Fall Precaution Comments: R hemiplegia, R inattention Restrictions Weight Bearing Restrictions: No   Therapy/Group: Individual Therapy  Gaylord Shih, SPT 10/14/2020, 11:15 AM

## 2020-10-14 NOTE — Progress Notes (Signed)
Occupational Therapy Session Note  Patient Details  Name: Jason Romero MRN: [id number] Date of Birth: Jan 21, 1971  Today's Date: 10/14/2020 OT Individual Time: 8295-6213 OT Individual Time Calculation (min): 48 min   Short Term Goals: Week 3:  OT Short Term Goal 1 (Week 3): STGs=LTGs set at Mod A overall  Skilled Therapeutic Interventions/Progress Updates:    Pt greeted in bed with no c/o pain. Wife [name] and mother [name] present for family education today. Started with education regarding squat pivot toilet transfers and showed them the bariatric drop arm BSC that he will receive for home use. OT first explained and demonstrated squat pivot<BSC, sit<stand using bariatric RW for clothing mgt + hygiene tasks, and then squat pivot back to bed. Per family, pt will always have +2 assist from multiple family members rotating assistance at home. Discussed having 1 helper assist pt with balance while 2nd person assists pt with ADL task. Both [name] and [name] had hands on practice with transfer training and either providing balance support during stands with RW or assisting with aspect of ADL during these stands. Pt wore his sneakers with Rt AFO component throughout. After pt donned his pants at sit<stand level, mother assisted with squat pivot<w/c while [name] steadied the equipment. They would both benefit from additional hands on practice as they required cues for setup/execution of transfers throughout tx. Pt remained sitting up in the w/c with all needs within reach, safety belt fastened, and half lap tray, in care of RN for morning medicine and anticipating next therapist for family education.   Therapy Documentation Precautions:  Precautions Precautions: Fall Precaution Comments: R hemiplegia, R inattention Restrictions Weight Bearing Restrictions: No ADL: ADL Eating: NPO ([employer]) Grooming: Minimal assistance Where Assessed-Grooming: Bed level Upper Body Bathing: Moderate  assistance, Maximal cueing Where Assessed-Upper Body Bathing: Edge of bed Lower Body Bathing: Moderate assistance Where Assessed-Lower Body Bathing: Bed level Upper Body Dressing: Moderate assistance Where Assessed-Upper Body Dressing: Edge of bed Lower Body Dressing: Maximal assistance, Maximal cueing Where Assessed-Lower Body Dressing: Bed level Toileting: Dependent (Incontinent x2) Where Assessed-Toileting: Bed level Toilet Transfer: Not assessed Tub/Shower Transfer: Not assessed      Therapy/Group: Individual Therapy  Cherly Erno A Adajah Cocking 10/14/2020, 12:19 PM

## 2020-10-15 ENCOUNTER — Inpatient Hospital Stay (HOSPITAL_COMMUNITY): Payer: Self-pay | Admitting: Physical Therapy

## 2020-10-15 ENCOUNTER — Inpatient Hospital Stay (HOSPITAL_COMMUNITY): Payer: Self-pay

## 2020-10-15 ENCOUNTER — Encounter (HOSPITAL_COMMUNITY): Payer: Self-pay | Admitting: Occupational Therapy

## 2020-10-15 DIAGNOSIS — I611 Nontraumatic intracerebral hemorrhage in hemisphere, cortical: Secondary | ICD-10-CM

## 2020-10-15 NOTE — Plan of Care (Signed)
  Problem: Consults Goal: RH STROKE PATIENT EDUCATION Description: See Patient Education module for education specifics  Outcome: Progressing Goal: Nutrition Consult-if indicated Outcome: Progressing   Problem: RH BOWEL ELIMINATION Goal: RH STG MANAGE BOWEL WITH ASSISTANCE Description: STG Manage Bowel with min Assistance. Outcome: Progressing Goal: RH STG MANAGE BOWEL W/MEDICATION W/ASSISTANCE Description: STG Manage Bowel with Medication with mod I Assistance. Outcome: Progressing   Problem: RH BLADDER ELIMINATION Goal: RH STG MANAGE BLADDER WITH ASSISTANCE Description: STG Manage Bladder With min Assistance Outcome: Progressing   Problem: RH SKIN INTEGRITY Goal: RH STG SKIN FREE OF INFECTION/BREAKDOWN Description: Skin will be free of infection/breakdown with min assist Outcome: Progressing Goal: RH STG MAINTAIN SKIN INTEGRITY WITH ASSISTANCE Description: STG Maintain Skin Integrity With min Assistance. Outcome: Progressing   Problem: RH SAFETY Goal: RH STG ADHERE TO SAFETY PRECAUTIONS W/ASSISTANCE/DEVICE Description: STG Adhere to Safety Precautions With cues/reminders Assistance/Device. Outcome: Progressing   Problem: RH COGNITION-NURSING Goal: RH STG USES MEMORY AIDS/STRATEGIES W/ASSIST TO PROBLEM SOLVE Description: STG Uses Memory Aids/Strategies With cues/reminders Assistance to Problem Solve. Outcome: Progressing   Problem: RH KNOWLEDGE DEFICIT Goal: RH STG INCREASE KNOWLEDGE OF HYPERTENSION Description: Pt/family will increase knowledge of hypertension with cues/reminders assist from handouts and other learning materials provided and use of medications with dietary modifications and lifestyle modifications Outcome: Progressing Goal: RH STG INCREASE KNOWLEDGE OF STROKE PROPHYLAXIS Description: Pt/family will be able to increase knowledge of stroke prophylaxis with cues/reminders assist from handouts and other materials provided for medications Outcome:  Progressing

## 2020-10-15 NOTE — Progress Notes (Signed)
[facility] PHYSICAL MEDICINE & REHABILITATION PROGRESS NOTE   Subjective/Complaints:  Family training in progress for OT.toilet transfers, discussed importance of compliance BP meds with pt and family Pt has no PCP  ROS- unable to determine due to expressive aphasia.  Objective:   DG Swallowing Func-Speech Pathology  Result Date: 10/13/2020 Objective Swallowing Evaluation: Type of Study: MBS-Modified Barium Swallow Study  Patient Details Name: Jason Romero MRN: [id number] Date of Birth: 07-Oct-1971 Today's Date: 10/13/2020 Past Medical History: Past Medical History: Diagnosis Date . A-fib (HCC)  . Chronic headaches  . COPD (chronic obstructive pulmonary disease) (HCC)  . GERD (gastroesophageal reflux disease)  . History of blood transfusion  . Hypertension  . MI (myocardial infarction) (HCC)  . Seizures (HCC)  Past Surgical History: Past Surgical History: Procedure Laterality Date . CHOLECYSTECTOMY   HPI: The pt is a 49 yo male presenting with R-sided weakness and found to have intraparenchymal hematoma in the left basal ganglia on original CT. Repeat imaging revealed increased hemorrhage with 42mm midline shift. PMH includes: GERD, seizures, HTN, COPD, MI, afib, and current tobacco use. Admitted to CIR 09/20/20, previous MBS 09/16/20 and 09/28/20.  Assessment / Plan / Recommendation CHL IP CLINICAL IMPRESSIONS 10/13/2020 Clinical Impression --Pt presents with moderate oropharyngeal dysphagia, but some improvement in swallow function noted since last MBSS 09/28/20. Thin barium consistently penetrated into the laryngeal vestibule at various levels, sometimes reaching the level of the vocal folds, and in once instance being aspirated prior to swallow initiation. Pt's cough response was not effective to completely clear aspirates and penetrates. He was unable to trigger volitional coughs or extra dry swallows (due to suspected apraxia/motor deficits) to clear penetrates or vallecular residue, which  accumulated from lingual residue of thin which then fell into vallecular sinuses after initial swallow. Pt able to effectively use chin tuck maneuver to prevent aspiration of thin barium, although mild penetration above the vocal folds still observed X1. Nectar thick barium was consumed with timely swallow initiation, minimal vallecular residue (less than thin, perhaps due to increased weight of bolus aiding in inferior transit). Pt also demonstrated ability to consume barium pill whole inside bite of puree without oral or pharyngeal abnormalities. However, in brief scan of esophagus, pill was observed to move slowly and stayed in lower esophagus for >2 mins. Recommend pt continue Dys 3 (mechanical soft) solids, upgrade to nectar thick liquids, pills whole in purees one at a time, follow solids with liquid, small bites/sips, slow rate, remain seated upright for at least 30 mins after intake, full supervision to assist with implementation of swallow strategies. Pt may also begin [place] Water Protocol WITH CHIN TUCK. ST will continue to follow pt to ensure diet safety, efficiency, and skilled interventions to work toward greater independence with swallow strategies and potential diet advancement.  SLP Visit Diagnosis Dysphagia, oropharyngeal phase (R13.12);Aphasia (R47.01) Attention and concentration deficit following -- Frontal lobe and executive function deficit following -- Impact on safety and function Moderate aspiration risk   CHL IP TREATMENT RECOMMENDATION 09/16/2020 Treatment Recommendations Therapy as outlined in treatment plan below   Prognosis 09/16/2020 Prognosis for Safe Diet Advancement Good Barriers to Reach Goals Cognitive deficits;Language deficits Barriers/Prognosis Comment -- CHL IP DIET RECOMMENDATION 10/13/2020 SLP Diet Recommendations Dysphagia 3 (Mech soft) solids;Nectar thick liquid;Other (Comment) Liquid Administration via Cup;Straw Medication Administration Whole meds with puree Compensations  Minimize environmental distractions;Small sips/bites;Slow rate;Lingual sweep for clearance of pocketing;Follow solids with liquid;Other (Comment) Postural Changes Remain semi-upright after after feeds/meals (Comment);Seated upright at 90  degrees   CHL IP OTHER RECOMMENDATIONS 10/13/2020 Recommended Consults -- Oral Care Recommendations Oral care BID Other Recommendations --   CHL IP FOLLOW UP RECOMMENDATIONS 09/19/2020 Follow up Recommendations Inpatient Rehab   CHL IP FREQUENCY AND DURATION 09/16/2020 Speech Therapy Frequency (ACUTE ONLY) min 2x/week Treatment Duration 2 weeks      CHL IP ORAL PHASE 10/13/2020 Oral Phase Impaired Oral - Pudding Teaspoon -- Oral - Pudding Cup -- Oral - Honey Teaspoon NT Oral - Honey Cup NT Oral - Nectar Teaspoon NT Oral - Nectar Cup Lingual/palatal residue Oral - Nectar Straw WFL Oral - Thin Teaspoon NT Oral - Thin Cup Decreased bolus cohesion;Piecemeal swallowing;Lingual/palatal residue Oral - Thin Straw -- Oral - Puree NT Oral - Mech Soft NT Oral - Regular -- Oral - Multi-Consistency -- Oral - Pill WFL Oral Phase - Comment --  CHL IP PHARYNGEAL PHASE 10/13/2020 Pharyngeal Phase Impaired Pharyngeal- Pudding Teaspoon -- Pharyngeal -- Pharyngeal- Pudding Cup -- Pharyngeal -- Pharyngeal- Honey Teaspoon NT Pharyngeal -- Pharyngeal- Honey Cup NT Pharyngeal -- Pharyngeal- Nectar Teaspoon NT Pharyngeal -- Pharyngeal- Nectar Cup Pharyngeal residue - valleculae Pharyngeal Material does not enter airway Pharyngeal- Nectar Straw Delayed swallow initiation-vallecula Pharyngeal -- Pharyngeal- Thin Teaspoon NT Pharyngeal -- Pharyngeal- Thin Cup Delayed swallow initiation-vallecula;Penetration/Aspiration before swallow;Pharyngeal residue - valleculae;Reduced airway/laryngeal closure;Compensatory strategies attempted (with notebox) Pharyngeal Material enters airway, passes BELOW cords and not ejected out despite cough attempt by patient;Material enters airway, CONTACTS cords and not ejected  out;Material enters airway, remains ABOVE vocal cords and not ejected out Pharyngeal- Thin Straw -- Pharyngeal -- Pharyngeal- Puree NT Pharyngeal -- Pharyngeal- Mechanical Soft NT Pharyngeal -- Pharyngeal- Regular -- Pharyngeal -- Pharyngeal- Multi-consistency -- Pharyngeal -- Pharyngeal- Pill WFL Pharyngeal -- Pharyngeal Comment --  CHL IP CERVICAL ESOPHAGEAL PHASE 10/13/2020 Cervical Esophageal Phase WFL Pudding Teaspoon -- Pudding Cup -- Honey Teaspoon -- Honey Cup -- Nectar Teaspoon -- Nectar Cup -- Nectar Straw -- Thin Teaspoon -- Thin Cup -- Thin Straw -- Puree -- Mechanical Soft -- Regular -- Multi-consistency -- Pill -- Cervical Esophageal Comment -- [name] 10/13/2020, 12:27 PM              No results for input(s): WBC, HGB, HCT, PLT in the last 72 hours. No results for input(s): NA, K, CL, CO2, GLUCOSE, BUN, CREATININE, CALCIUM in the last 72 hours.  Intake/Output Summary (Last 24 hours) at 10/15/2020 0910 Last data filed at 10/15/2020 0836 Gross per 24 hour  Intake 258 ml  Output --  Net 258 ml        Physical Exam: Vital Signs Blood pressure (!) 159/101, pulse 81, temperature 97.8 F (36.6 C), resp. rate 18, height 6' 1.5" (1.867 m), weight 105 kg, SpO2 97 %.    General: No acute distress Mood and affect are appropriate Heart: Regular rate and rhythm no rubs murmurs or extra sounds Lungs: Clear to auscultation, breathing unlabored, no rales or wheezes Abdomen: Positive bowel sounds, soft nontender to palpation, nondistended Extremities: No clubbing, cyanosis, or edema Skin: No evidence of breakdown, no evidence of rash  Ex>rec aphasia , able to name simple objects Left side 5/5 in LUE and LLE Motor: RUE: 0/5 distal, 2- RIght shoulder adduction, trace biceps ---no change RLE: Hip flexion, knee extension 3-/5 distally 0/5 --no change  Assessment/Plan: 1. Functional deficits which require 3+ hours per day of interdisciplinary therapy in a comprehensive inpatient rehab  setting.  Physiatrist is providing close team supervision and 24 hour management of active medical  problems listed below.  Physiatrist and rehab team continue to assess barriers to discharge/monitor patient progress toward functional and medical goals  Care Tool:  Bathing    Body parts bathed by patient: Right arm, Chest, Abdomen, Front perineal area, Left upper leg, Face, Right upper leg, Left lower leg   Body parts bathed by helper: Left arm, Buttocks, Right lower leg     Bathing assist Assist Level: Minimal Assistance - Patient > 75%     Upper Body Dressing/Undressing Upper body dressing   What is the patient wearing?: Pull over shirt    Upper body assist Assist Level: Moderate Assistance - Patient 50 - 74%    Lower Body Dressing/Undressing Lower body dressing      What is the patient wearing?: Pants, Incontinence brief     Lower body assist Assist for lower body dressing: Maximal Assistance - Patient 25 - 49%     Toileting Toileting Toileting Activity did not occur (Clothing management and hygiene only):  (2 assit stedy)  Toileting assist Assist for toileting: Maximal Assistance - Patient 25 - 49%     Transfers Chair/bed transfer  Transfers assist     Chair/bed transfer assist level: Minimal Assistance - Patient > 75%     Locomotion Ambulation   Ambulation assist   Ambulation activity did not occur: Safety/medical concerns  Assist level: Maximal Assistance - Patient 25 - 49% Assistive device: Lite Gait Max distance: 2911ft   Walk 10 feet activity   Assist  Walk 10 feet activity did not occur: Safety/medical concerns  Assist level: Maximal Assistance - Patient 25 - 49% Assistive device: Lite Gait   Walk 50 feet activity   Assist Walk 50 feet with 2 turns activity did not occur: Safety/medical concerns         Walk 150 feet activity   Assist Walk 150 feet activity did not occur: Safety/medical concerns         Walk 10 feet on  uneven surface  activity   Assist Walk 10 feet on uneven surfaces activity did not occur: Safety/medical concerns         Wheelchair     Assist Will patient use wheelchair at discharge?: Yes Type of Wheelchair: Manual    Wheelchair assist level: Minimal Assistance - Patient > 75%, Supervision/Verbal cueing Max wheelchair distance: >150 ft    Wheelchair 50 feet with 2 turns activity    Assist        Assist Level: Minimal Assistance - Patient > 75%, Supervision/Verbal cueing   Wheelchair 150 feet activity     Assist      Assist Level: Minimal Assistance - Patient > 75%, Supervision/Verbal cueing   Blood pressure (!) 159/101, pulse 81, temperature 97.8 F (36.6 C), resp. rate 18, height 6' 1.5" (1.867 m), weight 105 kg, SpO2 97 %.    Medical Problem List and Plan: 1.  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  Antithrombotics: -DVT/anticoagulation: SCDs             -antiplatelet therapy: N/A 3. Pain Management: Tylenol as needed.   Appears to be controlled on 11/30 4. Mood: Provide emotional support             -antipsychotic agents: N/A 5. Neuropsych: This patient is not capable of making decisions on his own behalf. 6. Skin/Wound Care: Routine skin checks 7. Fluids/Electrolytes/Nutrition: Routine in and outs  BMP within acceptable range on 11/26  8.  Post stroke dysphagia.    D3 honey, advance as tolerated 9.  Hypertension.  Norvasc 10 mg daily, Normodyne 300 mg every 8 hours, lisinopril 20 mg twice daily.  Monitor with increased mobility  Hydralazine decreased to 25 3 times daily Vitals:   10/14/20 2125 10/15/20 0505  BP: (!) 146/92 (!) 159/101  Pulse: 91 81  Resp: 16 18  Temp: (!) 97.5 F (36.4 C) 97.8 F (36.6 C)  SpO2: 99% 97%   12/4 some elevation over last 12 hours but improved readings otherwise. Follow for consistent pattern before  making any changes.  10.  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  Provide counseling when appropriate 11.  Obesity.  BMI 30.67-->31.36-->31.02  Dietary follow-up  12. Incontinence of bowel and bladder  Trouble using call bell also is aphasic, cont efforts at timed toileting, pt now pointing to bathroom 13.  Right spastic hemiplegia  May need oral meds if this worsens  Wears WHO, wore PRAFO overnight 14.  Acute blood loss anemia  Hemoglobin 11.6 on 11/26  Continue to monitor  LOS: 25 days A FACE TO FACE EVALUATION WAS PERFORMED  Ranelle Oyster 10/15/2020, 9:10 AM

## 2020-10-15 NOTE — Progress Notes (Signed)
Speech Language Pathology Daily Session Note  Patient Details  Name: RYLAND TUNGATE MRN: [id number] Date of Birth: [birth year]  Today's Date: 10/15/2020 SLP Individual Time: 1000-1030 SLP Individual Time Calculation (min): 30 min  Short Term Goals: Week 4: SLP Short Term Goal 1 (Week 4): STG=LTG due to remaining length of stay  Skilled Therapeutic Interventions: Skilled SLP intervention focused on family education. Pt seated upright in wheelchair and wife present in room. Wife educated on pts current express and receptive language skills. Wife provided with handout on strategies for patient and aphasia worksheets to complete with him. Wife instructed to eliminate distractions, keep instructions simple, write information and allow pt to write information if having difficulty communicating verbally. Pt and wife education on functional tasks that can be used to address expressive/receptive language, written language and attention. Pt left lying in bed with RN tech changing patients brief. Cont with therapy per plan of care.      Pain Pain Assessment Pain Scale: Faces Pain Score: 0-No pain Faces Pain Scale: No hurt  Therapy/Group: Individual Therapy  [name] Zedrick Springsteen 10/15/2020, 12:27 PM

## 2020-10-15 NOTE — Progress Notes (Signed)
Occupational Therapy Session Note  Patient Details  Name: Jason Romero MRN: [id number] Date of Birth: 01/25/1971  Today's Date: 10/15/2020 OT Individual Time: 1035-1120 OT Individual Time Calculation (min): 45 min    Short Term Goals: Week 3:  OT Short Term Goal 1 (Week 3): STGs=LTGs set at Mod A overall  Skilled Therapeutic Interventions/Progress Updates:    Treatment session with focus on self-care retraining, sit > stand, and functional transfers.  Pt scheduled for family education, wife off the unit at beginning of session. Pt received in bed having just been assisted by nursing staff with hygiene post BM.  Pt agreeable to dressing tasks.  Completed bed mobility to come to sitting at EOB with min assist and cues for attention to RLE and RUE, as pt with tendency to leave RUE behind self with wrist flexed underneath.  Pt able to thread RLE in to pants with assistance to achieve and maintain figure 4 position and then pt able to thread LLE in to pants with close supervision for sitting balance.  Completed sit > stand from EOB with RW to pull up pants.  Pt able to come up to standing with mod assist and then able to pull pants over hips with increased time and mod assist for standing balance.  Completed squat pivot transfer min assist bed > w/c with facilitation for anterior weight shift.  Therapist donned Rt shoe with AFO max assist and then pt able to don left shoe with min assist.  Pt's wife arrived after transfer to w/c.  Engaged in blocked practice of squat pivot transfers w/c <> therapy mat for improved sequencing and weight shifting.  Wife asking questions about appropriate foot placement prior to transfers and then able to complete squat pivot transfers min-mod assist with no additional cues of assistance from therapist.  Pt's wife reports feeling more confident with transfers and standing after practice during PT session this AM and transfers during this session.  Pt returned to room and  remained upright in w/c with seat belt alarm on and all needs in reach.  Therapy Documentation Precautions:  Precautions Precautions: Fall Precaution Comments: R hemiplegia, R inattention Restrictions Weight Bearing Restrictions: No Pain: Pain Assessment Pain Scale: 0-10 Pain Score: 0-No pain   Therapy/Group: Individual Therapy  Rosalio Loud 10/15/2020, 12:26 PM

## 2020-10-15 NOTE — Progress Notes (Signed)
Physical Therapy Session Note  Patient Details  Name: Jason Romero MRN: [id number] Date of Birth: [birth year]  Today's Date: 10/15/2020 PT Individual Time: 0915-1000 PT Individual Time Calculation (min): 45 min   Short Term Goals: Week 3:  PT Short Term Goal 1 (Week 3): =LTGs  Skilled Therapeutic Interventions/Progress Updates:    pt received semi reclined in bed with wife at bedside. Agreeable to PT and wife agreeable to further education with car transfer. Supine>sit mod assist for RLE/UE management and min assist scooting to EOB. PT donned shoes/AFO, socks and pants around knees in sitting max assist. Sit>stand min assist and pt able to complete ~80% lower body dressing. Stand pivot transfer +2 with RW to wc. Pt transported total assist to ortho gym.   Transfers: -to/from mat table with wife providing min assist for RLE/UE management and setup. Returning to wc to R, pt with profound LOB to R, requiring +2 in order to assist pt to wc safely.  -Car transfer initially entering with squat pivot, however, pt, PT and wife all agreeable to use stand pivot without AD moving forward. Pt requiring min assist for setup and RLE/UE management as above. Wife educated about foot placement and reports feeling much more comfortable with this type of transfer this date.   Gait: -pt ambulated ~59f with RW and min assist. Pt is ambulating much better with modifications that CGerald Stabsfrom [facility] LLCmade to his shoes.   Pt transported to room total assist. Left sitting up in wc with chair alarm on, needs met, and call bell in reach. Wife at bedside.   Therapy Documentation Precautions:  Precautions Precautions: Fall Precaution Comments: R hemiplegia, R inattention Restrictions Weight Bearing Restrictions: No Pain: Pain Assessment Pain Scale: Faces Pain Score: 0-No pain Faces Pain Scale: No hurt   Therapy/Group: Individual Therapy  BGaylord Shih SPT 10/15/2020, 12:37 PM

## 2020-10-15 NOTE — Plan of Care (Signed)
  Problem: RH Ambulation Goal: LTG Patient will ambulate in controlled environment (PT) Description: LTG: Patient will ambulate in a controlled environment, # of feet with assistance (PT). Outcome: Not Applicable Flowsheets (Taken 10/15/2020 1632) LTG: Pt will ambulate in controlled environ  assist needed:: (d/c due to pt being transfer level only with family)  Note: D/c due to slow progress    Problem: Sit to Stand Goal: LTG:  Patient will perform sit to stand with assistance level (PT) Description: LTG:  Patient will perform sit to stand with assistance level (PT) Flowsheets (Taken 10/15/2020 1632) LTG: PT will perform sit to stand in preparation for functional mobility with assistance level: Moderate Assistance - Patient 50 - 74% Note: Downgraded due to slow progress   Problem: RH Bed Mobility Goal: LTG Patient will perform bed mobility with assist (PT) Description: LTG: Patient will perform bed mobility with assistance, with/without cues (PT). Flowsheets (Taken 10/15/2020 1632) LTG: Pt will perform bed mobility with assistance level of: Moderate Assistance - Patient 50 - 74% Note: Downgraded due to slow progress

## 2020-10-16 NOTE — Progress Notes (Signed)
[place] PHYSICAL MEDICINE & REHABILITATION PROGRESS NOTE   Subjective/Complaints: No new issues. Slept well. Sleeping soundly when I entered  ROS: limited due to language/communication    Objective:   No results found. No results for input(s): WBC, HGB, HCT, PLT in the last 72 hours. No results for input(s): NA, K, CL, CO2, GLUCOSE, BUN, CREATININE, CALCIUM in the last 72 hours.  Intake/Output Summary (Last 24 hours) at 10/16/2020 0937 Last data filed at 10/15/2020 1900 Gross per 24 hour  Intake 240 ml  Output --  Net 240 ml        Physical Exam: Vital Signs Blood pressure (!) 136/92, pulse 83, temperature 97.7 F (36.5 C), resp. rate 15, height 6' 1.5" (1.867 m), weight 106 kg, SpO2 98 %.    Constitutional: No distress . Vital signs reviewed. HEENT: EOMI, oral membranes moist Neck: supple Cardiovascular: RRR without murmur. No JVD    Respiratory/Chest: CTA Bilaterally without wheezes or rales. Normal effort    GI/Abdomen: BS +, non-tender, non-distended Ext: no clubbing, cyanosis, or edema Psych: pleasant and cooperative Ex>rec aphasia , able to name simple objects Left side 5/5 in LUE and LLE Motor: RUE: 0/5 distal, 2- RIght shoulder adduction, trace biceps ---no change RLE: Hip flexion, knee extension 3-/5 distally 0/5 --no change  Assessment/Plan: 1. Functional deficits which require 3+ hours per day of interdisciplinary therapy in a comprehensive inpatient rehab setting.  Physiatrist is providing close team supervision and 24 hour management of active medical problems listed below.  Physiatrist and rehab team continue to assess barriers to discharge/monitor patient progress toward functional and medical goals  Care Tool:  Bathing    Body parts bathed by patient: Right arm, Chest, Abdomen, Front perineal area, Left upper leg, Face, Right upper leg, Left lower leg   Body parts bathed by helper: Left arm, Buttocks, Right lower leg     Bathing assist  Assist Level: Minimal Assistance - Patient > 75%     Upper Body Dressing/Undressing Upper body dressing   What is the patient wearing?: Pull over shirt    Upper body assist Assist Level: Moderate Assistance - Patient 50 - 74%    Lower Body Dressing/Undressing Lower body dressing      What is the patient wearing?: Pants     Lower body assist Assist for lower body dressing: Minimal Assistance - Patient > 75%     Toileting Toileting Toileting Activity did not occur Press photographer and hygiene only):  (2 assit stedy)  Toileting assist Assist for toileting: Maximal Assistance - Patient 25 - 49%     Transfers Chair/bed transfer  Transfers assist     Chair/bed transfer assist level: Minimal Assistance - Patient > 75%     Locomotion Ambulation   Ambulation assist   Ambulation activity did not occur: Safety/medical concerns  Assist level: Maximal Assistance - Patient 25 - 49% Assistive device: Lite Gait Max distance: 83ft   Walk 10 feet activity   Assist  Walk 10 feet activity did not occur: Safety/medical concerns  Assist level: Maximal Assistance - Patient 25 - 49% Assistive device: Lite Gait   Walk 50 feet activity   Assist Walk 50 feet with 2 turns activity did not occur: Safety/medical concerns         Walk 150 feet activity   Assist Walk 150 feet activity did not occur: Safety/medical concerns         Walk 10 feet on uneven surface  activity   Assist Walk 10  feet on uneven surfaces activity did not occur: Safety/medical concerns         Wheelchair     Assist Will patient use wheelchair at discharge?: Yes Type of Wheelchair: Manual    Wheelchair assist level: Minimal Assistance - Patient > 75%, Supervision/Verbal cueing Max wheelchair distance: >150 ft    Wheelchair 50 feet with 2 turns activity    Assist        Assist Level: Minimal Assistance - Patient > 75%, Supervision/Verbal cueing   Wheelchair 150 feet  activity     Assist      Assist Level: Minimal Assistance - Patient > 75%, Supervision/Verbal cueing   Blood pressure (!) 136/92, pulse 83, temperature 97.7 F (36.5 C), resp. rate 15, height 6' 1.5" (1.867 m), weight 106 kg, SpO2 98 %.    Medical Problem List and Plan: 1.  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  Antithrombotics: -DVT/anticoagulation: SCDs             -antiplatelet therapy: N/A 3. Pain Management: Tylenol as needed.   Appears to be controlled on 11/30 4. Mood: Provide emotional support             -antipsychotic agents: N/A 5. Neuropsych: This patient is not capable of making decisions on his own behalf. 6. Skin/Wound Care: Routine skin checks 7. Fluids/Electrolytes/Nutrition: Routine in and outs  BMP within acceptable range on 11/26  8.  Post stroke dysphagia.    D3 honey, advance as tolerated 9.  Hypertension.  Norvasc 10 mg daily, Normodyne 300 mg every 8 hours, lisinopril 20 mg twice daily.  Monitor with increased mobility  Hydralazine decreased to 25 3 times daily Vitals:   10/15/20 1933 10/16/20 0430  BP: 125/86 (!) 136/92  Pulse: 90 83  Resp: 15 15  Temp: 97.9 F (36.6 C) 97.7 F (36.5 C)  SpO2: 97% 98%   12/5 DBP showing some improvement---continue with current regimen 10.  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  Provide counseling when appropriate 11.  Obesity.  BMI 30.67-->31.36-->31.02  Dietary follow-up  12. Incontinence of bowel and bladder  Trouble using call bell also is aphasic, cont efforts at timed toileting, pt now pointing to bathroom 13.  Right spastic hemiplegia  May need oral meds if this worsens  Wears WHO, wore PRAFO overnight 14.  Acute blood loss anemia  Hemoglobin 11.6 on 11/26  Continue to monitor  LOS: 26 days A FACE TO FACE EVALUATION WAS  PERFORMED  Ranelle Oyster 10/16/2020, 9:37 AM

## 2020-10-16 NOTE — Plan of Care (Signed)
  Problem: Consults Goal: RH STROKE PATIENT EDUCATION Description: See Patient Education module for education specifics  Outcome: Progressing Goal: Nutrition Consult-if indicated Outcome: Progressing   Problem: RH BOWEL ELIMINATION Goal: RH STG MANAGE BOWEL WITH ASSISTANCE Description: STG Manage Bowel with min Assistance. Outcome: Progressing Goal: RH STG MANAGE BOWEL W/MEDICATION W/ASSISTANCE Description: STG Manage Bowel with Medication with mod I Assistance. Outcome: Progressing   Problem: RH BLADDER ELIMINATION Goal: RH STG MANAGE BLADDER WITH ASSISTANCE Description: STG Manage Bladder With min Assistance Outcome: Progressing   Problem: RH SKIN INTEGRITY Goal: RH STG SKIN FREE OF INFECTION/BREAKDOWN Description: Skin will be free of infection/breakdown with min assist Outcome: Progressing Goal: RH STG MAINTAIN SKIN INTEGRITY WITH ASSISTANCE Description: STG Maintain Skin Integrity With min Assistance. Outcome: Progressing   Problem: RH SAFETY Goal: RH STG ADHERE TO SAFETY PRECAUTIONS W/ASSISTANCE/DEVICE Description: STG Adhere to Safety Precautions With cues/reminders Assistance/Device. Outcome: Progressing   Problem: RH COGNITION-NURSING Goal: RH STG USES MEMORY AIDS/STRATEGIES W/ASSIST TO PROBLEM SOLVE Description: STG Uses Memory Aids/Strategies With cues/reminders Assistance to Problem Solve. Outcome: Progressing   Problem: RH KNOWLEDGE DEFICIT Goal: RH STG INCREASE KNOWLEDGE OF HYPERTENSION Description: Pt/family will increase knowledge of hypertension with cues/reminders assist from handouts and other learning materials provided and use of medications with dietary modifications and lifestyle modifications Outcome: Progressing Goal: RH STG INCREASE KNOWLEDGE OF STROKE PROPHYLAXIS Description: Pt/family will be able to increase knowledge of stroke prophylaxis with cues/reminders assist from handouts and other materials provided for medications Outcome:  Progressing   

## 2020-10-17 ENCOUNTER — Inpatient Hospital Stay (HOSPITAL_COMMUNITY): Payer: Self-pay | Admitting: Occupational Therapy

## 2020-10-17 ENCOUNTER — Inpatient Hospital Stay (HOSPITAL_COMMUNITY): Payer: Self-pay

## 2020-10-17 ENCOUNTER — Inpatient Hospital Stay (HOSPITAL_COMMUNITY): Payer: Self-pay | Admitting: Speech Pathology

## 2020-10-17 NOTE — Progress Notes (Signed)
Occupational Therapy Session Note  Patient Details  Name: Jason Romero MRN: [id number] Date of Birth: 03-04-1971  Today's Date: 10/17/2020 OT Individual Time: 2440-1027 OT Individual Time Calculation (min): 56 min    Short Term Goals: Week 3:  OT Short Term Goal 1 (Week 3): STGs=LTGs set at Mod A overall  Skilled Therapeutic Interventions/Progress Updates:    Pt greeted in bed with no c/o pain, requesting to shower. Bed mobility completed with Min A and Mod A for squat pivot<w/c, Mod A for stand pivot<bariatric BSC in shower with pt able to advance the Rt LE. He then bathed while seated, utilizing leans for perihygiene with pt able to thoroughly wash buttocks for the first time. CGA while he lifted the Rt LE over his Lt knee to wash his affected foot. Pt able to wash the Lt side using one handed techniques with instruction. Mod A for squat pivot<w/c afterwards, pt attempted stand pivot before however the Rt knee began buckling so he sat back down. Dressing was then completed w/c level sit<stand using the bariatric RW. Pt able to utilize figure 4 position to don both gripper socks with CGA for the Rt foot. Set the brief up as underwear for pt to assist with threading/pulling up. Pt required assistance for threading the Lt side. Mod balance assist in standing with the bari walker while pt elevated his LB clothing. Min A for donning overhead shirt with instruction for hemi techniques. Min A for oral care completion while seated, once again with instruction for one handed techniques. Min A for stand pivot<bed using the bedrail and going towards his unaffected side. Pt returned to bed with Min A. Left him with all needs, hemiplegic side protected, and bed alarm set.     Spouse arrived after pt had returned to bed  Therapy Documentation Precautions:  Precautions Precautions: Fall Precaution Comments: R hemiplegia, R inattention Restrictions Weight Bearing Restrictions: No ADL: ADL Eating: NPO  (CorTrak) Grooming: Minimal assistance Where Assessed-Grooming: Bed level Upper Body Bathing: Moderate assistance, Maximal cueing Where Assessed-Upper Body Bathing: Edge of bed Lower Body Bathing: Moderate assistance Where Assessed-Lower Body Bathing: Bed level Upper Body Dressing: Moderate assistance Where Assessed-Upper Body Dressing: Edge of bed Lower Body Dressing: Maximal assistance, Maximal cueing Where Assessed-Lower Body Dressing: Bed level Toileting: Dependent (Incontinent x2) Where Assessed-Toileting: Bed level Toilet Transfer: Not assessed Tub/Shower Transfer: Not assessed      Therapy/Group: Individual Therapy  Thaer Miyoshi A Katelen Luepke 10/17/2020, 12:17 PM

## 2020-10-17 NOTE — Discharge Summary (Signed)
Occupational Therapy Discharge Summary  Patient Details  Name: Jason Romero MRN: [id number] Date of Birth: 12-13-1970   Patient has met 10 of 12 long term goals due to improved activity tolerance, improved balance, postural control, ability to compensate for deficits, improved attention, improved awareness and improved coordination.  Patient to discharge at overall Mod Assist level.  Patient's mother Jason Romero and wife Jason Romero have participated in hands on family education. Education has been provided in regards to only proceeding with showering at home with guidance and assistance from f/u OT as Jason Romero plans to use the walk-in shower with pt.   Pt still requires increased assistance for toileting and also for using his affected hand as a gross stabilizer, therefore these goals were unable to be met  Recommendation:  Patient will benefit from ongoing skilled OT services in home health setting to continue to advance functional skills in the area of BADL.  Equipment: bariatric BSC + TTB  Reasons for discharge: treatment goals met and discharge from hospital  Patient/family agrees with progress made and goals achieved: Yes  OT Discharge Precautions/Restrictions  Precautions Precautions: Fall ADL ADL Eating: Supervision/safety Grooming: Minimal assistance Where Assessed-Grooming: Sitting at sink Upper Body Bathing: Supervision/safety Where Assessed-Upper Body Bathing: Shower Lower Body Bathing: Contact guard Where Assessed-Lower Body Bathing: Shower Upper Body Dressing: Minimal assistance Where Assessed-Upper Body Dressing: Sitting at sink Lower Body Dressing: Moderate assistance Where Assessed-Lower Body Dressing: Sitting at sink Toileting: Maximal assistance Where Assessed-Toileting: Glass blower/designer: Moderate assistance Toilet Transfer Method: Squat pivot Toilet Transfer Equipment: Extra wide drop arm bedside commode Tub/Shower Transfer: Not assessed Financial planner: Moderate assistance Social research officer, government Method: Education officer, environmental: Special educational needs teacher  Perception: Impaired Inattention/Neglect: Does not attend to right visual field;Does not attend to right side of body Praxis Praxis: Impaired Praxis Impairment Details: Motor planning Cognition Arousal/Alertness: Awake/alert Orientation Level:  (difficult to assess pts orientation due to expressive aphasia) Problem Solving: Impaired Behaviors: Restless;Impulsive Safety/Judgment: Impaired Comments: Impulsivity has improved over the course of pts CIR stay, however he still requires vcs for safety awareness during functional activity Sensation Coordination Gross Motor Movements are Fluid and Coordinated: No Fine Motor Movements are Fluid and Coordinated: No Coordination and Movement Description: Dense Rt hemiparesis UE>E Finger Nose Finger Test: Unable to assess due to faccidity Motor  Motor Motor: Abnormal tone;Hemiplegia Trunk/Postural Assessment  Postural Control Postural Control: Deficits on evaluation (Limited in sitting/standing)  Balance Balance Balance Assessed: Yes Dynamic Sitting Balance Dynamic Sitting - Balance Support: Feet supported Dynamic Sitting - Level of Assistance: 5: Stand by assistance (perihygiene completion in the shower) Dynamic Standing Balance Dynamic Standing - Balance Support: Right upper extremity supported;During functional activity Dynamic Standing - Level of Assistance: 3: Mod assist Dynamic Standing - Balance Activities: Forward lean/weight shifting;Lateral lean/weight shifting (pulling pants over hips while standing with bariatric RW) Extremity/Trunk Assessment RUE Assessment RUE Assessment: Exceptions to [facility] (flaccid) LUE Assessment LUE Assessment: Within Functional Limits   Jason Romero 10/17/2020, 4:56 PM

## 2020-10-17 NOTE — Progress Notes (Signed)
Patient ID: Jason Romero, male   DOB: [birth year], 49 y.o.   MRN: [id number]   Patient MATCH entered into system.  [place], [place] [phone]

## 2020-10-17 NOTE — Progress Notes (Signed)
Jason Romero PHYSICAL MEDICINE & REHABILITATION PROGRESS NOTE   Subjective/Complaints:  Patient with spouse, getting hair done, no c/os today , feels like RLE moving better, remains aphasic with occ words/phrases  ROS: limited due to language/communication    Objective:   No results found. No results for input(s): WBC, HGB, HCT, PLT in the last 72 hours. No results for input(s): NA, K, CL, CO2, GLUCOSE, BUN, CREATININE, CALCIUM in the last 72 hours.  Intake/Output Summary (Last 24 hours) at 10/17/2020 1015 Last data filed at 10/17/2020 0950 Gross per 24 hour  Intake 650 ml  Output --  Net 650 ml        Physical Exam: Vital Signs Blood pressure 128/85, pulse 83, temperature 98 F (36.7 C), resp. rate 14, height 6' 1.5" (1.867 m), weight 105.2 kg, SpO2 99 %.    General: No acute distress Mood and affect are appropriate Heart: Regular rate and rhythm no rubs murmurs or extra sounds Lungs: Clear to auscultation, breathing unlabored, no rales or wheezes Abdomen: Positive bowel sounds, soft nontender to palpation, nondistended Extremities: No clubbing, cyanosis, or edema Skin: No evidence of breakdown, no evidence of rash  Ex>rec aphasia , able to name simple objects Left side 5/5 in LUE and LLE Motor: RUE: 0/5 distal, 2- RIght shoulder adduction, trace biceps ---no change RLE: Hip flexion, knee extension 3-/5 distally 0/5 --no change  Assessment/Plan: 1. Functional deficits which require 3+ hours per day of interdisciplinary therapy in a comprehensive inpatient rehab setting.  Physiatrist is providing close team supervision and 24 hour management of active medical problems listed below.  Physiatrist and rehab team continue to assess barriers to discharge/monitor patient progress toward functional and medical goals  Care Tool:  Bathing    Body parts bathed by patient: Right arm, Chest, Abdomen, Front perineal area, Left upper leg, Face, Right upper leg, Left lower leg,  Buttocks, Right lower leg, Left arm   Body parts bathed by helper: Left arm, Buttocks, Right lower leg     Bathing assist Assist Level: Contact Guard/Touching assist     Upper Body Dressing/Undressing Upper body dressing   What is the patient wearing?: Pull over shirt    Upper body assist Assist Level: Minimal Assistance - Patient > 75%    Lower Body Dressing/Undressing Lower body dressing      What is the patient wearing?: Pants, Incontinence brief     Lower body assist Assist for lower body dressing: Moderate Assistance - Patient 50 - 74%     Toileting Toileting Toileting Activity did not occur Press photographer and hygiene only):  (2 assit stedy)  Toileting assist Assist for toileting: Maximal Assistance - Patient 25 - 49%     Transfers Chair/bed transfer  Transfers assist     Chair/bed transfer assist level: Minimal Assistance - Patient > 75%     Locomotion Ambulation   Ambulation assist   Ambulation activity did not occur: Safety/medical concerns  Assist level: Maximal Assistance - Patient 25 - 49% Assistive device: Lite Gait Max distance: 72ft   Walk 10 feet activity   Assist  Walk 10 feet activity did not occur: Safety/medical concerns  Assist level: Maximal Assistance - Patient 25 - 49% Assistive device: Lite Gait   Walk 50 feet activity   Assist Walk 50 feet with 2 turns activity did not occur: Safety/medical concerns         Walk 150 feet activity   Assist Walk 150 feet activity did not occur: Safety/medical concerns  Walk 10 feet on uneven surface  activity   Assist Walk 10 feet on uneven surfaces activity did not occur: Safety/medical concerns         Wheelchair     Assist Will patient use wheelchair at discharge?: Yes Type of Wheelchair: Manual    Wheelchair assist level: Minimal Assistance - Patient > 75%, Supervision/Verbal cueing Max wheelchair distance: >150 ft    Wheelchair 50 feet with 2  turns activity    Assist        Assist Level: Minimal Assistance - Patient > 75%, Supervision/Verbal cueing   Wheelchair 150 feet activity     Assist      Assist Level: Minimal Assistance - Patient > 75%, Supervision/Verbal cueing   Blood pressure 128/85, pulse 83, temperature 98 F (36.7 C), resp. rate 14, height 6' 1.5" (1.867 m), weight 105.2 kg, SpO2 99 %.    Medical Problem List and Plan: 1.  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  Antithrombotics: -DVT/anticoagulation: SCDs             -antiplatelet therapy: N/A 3. Pain Management: Tylenol as needed.   Appears to be controlled on 11/30 4. Mood: Provide emotional support             -antipsychotic agents: N/A 5. Neuropsych: This patient is not capable of making decisions on his own behalf. 6. Skin/Wound Care: Routine skin checks 7. Fluids/Electrolytes/Nutrition: Routine in and outs  BMP within acceptable range on 11/26  8.  Post stroke dysphagia.    D3 honey, advance as tolerated 9.  Hypertension.  Norvasc 10 mg daily, Normodyne 300 mg every 8 hours, lisinopril 20 mg twice daily.  Monitor with increased mobility  Hydralazine decreased to 25 3 times daily Vitals:   10/16/20 2052 10/17/20 0437  BP: (!) 119/91 128/85  Pulse: 78 83  Resp:  14  Temp:  98 F (36.7 C)  SpO2:  99%   Controlled 12/6 10.  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  Provide counseling when appropriate 11.  Obesity.  BMI 30.67-->31.36-->31.02  Dietary follow-up  12. Incontinence of bowel and bladder  Trouble using call bell also is aphasic, cont efforts at timed toileting, pt now pointing to bathroom 13.  Right spastic hemiplegia  May need oral meds if this worsens  Wears WHO, wore PRAFO overnight 14.  Acute blood loss anemia  Hemoglobin 11.6 on  11/26  Continue to monitor  LOS: 27 days A FACE TO FACE EVALUATION WAS PERFORMED  Erick Colace 10/17/2020, 10:15 AM

## 2020-10-17 NOTE — Progress Notes (Signed)
Speech Language Pathology Daily Session Note  Patient Details  Name: Jason Romero MRN: [id number] Date of Birth: 1971/06/03  Today's Date: 10/17/2020 SLP Individual Time: 1102-1200 SLP Individual Time Calculation (min): 58 min  Short Term Goals: Week 4: SLP Short Term Goal 1 (Week 4): STG=LTG due to remaining length of stay  Skilled Therapeutic Interventions: Pt was seen for skilled ST targeting swallowing and communication goals, as well as finishing education with pt's wife at bedside. Discussed recommended home activities to target expressive aphasia and provided with handouts. Wife also returned SLP's demonstration of some effective cueing technique for pt - slowing down, written cues, sentence completion and gestural cues. All questions answered to their satisfaction. Pt accepted ~5oz thin H2O in accordance with [place] Water Protocol standards with 1 immediate cough and 1 delayed throat clear. He demonstrated ability to preform chin tuck during thin intake with Min faded to Supervision A level cues. Recommend continue current Dys 3/nectar diet but water protocol may also continue. Communication interventions focused on phrase and sentence level communication about personally relevant topics (his cat, family, etc.) and also assisting pt to communicate his questions about upcoming discharge. Pt required overall Mod A question cues, sentence completion, and 1 written cue for functional expression during these exchanges. Pt left laying in bed with alarm set and needs within reach, wife still present. Continue per current plan of care.          Pain Pain Assessment Pain Scale: 0-10 Pain Score: 0-No pain  Therapy/Group: Individual Therapy  [name] 10/17/2020, 7:15 AM

## 2020-10-17 NOTE — Discharge Summary (Signed)
Physician Discharge Summary  Patient ID: Jason Romero MRN: [id number] DOB/AGE: October 16, 1971 49 y.o.  Admit date: 09/20/2020 Discharge date: 10/19/2020  Discharge Diagnoses:  Principal Problem:   ICH (intracerebral hemorrhage) (HCC) Active Problems:   Acute blood loss anemia   Spastic hemiplegia affecting nondominant side (HCC)   Dysphagia, post-stroke   Labile blood pressure History of tobacco abuse as well as marijuana Obesity Reportedly history of atrial fibrillation Remote seizure history   Discharged Condition: Stable  Significant Diagnostic Studies: DG Swallowing Func-Speech Pathology  Result Date: 10/13/2020 Objective Swallowing Evaluation: Type of Study: MBS-Modified Barium Swallow Study  Patient Details Name: Jason Romero MRN: [id number] Date of Birth: 1971/04/11 Today's Date: 10/13/2020 Past Medical History: Past Medical History: Diagnosis Date . A-fib (HCC)  . Chronic headaches  . COPD (chronic obstructive pulmonary disease) (HCC)  . GERD (gastroesophageal reflux disease)  . History of blood transfusion  . Hypertension  . MI (myocardial infarction) (HCC)  . Seizures (HCC)  Past Surgical History: Past Surgical History: Procedure Laterality Date . CHOLECYSTECTOMY   HPI: The pt is a 49 yo male presenting with R-sided weakness and found to have intraparenchymal hematoma in the left basal ganglia on original CT. Repeat imaging revealed increased hemorrhage with 43mm midline shift. PMH includes: GERD, seizures, HTN, COPD, MI, afib, and current tobacco use. Admitted to CIR 09/20/20, previous MBS 09/16/20 and 09/28/20.  Assessment / Plan / Recommendation CHL IP CLINICAL IMPRESSIONS 10/13/2020 Clinical Impression --Pt presents with moderate oropharyngeal dysphagia, but some improvement in swallow function noted since last MBSS 09/28/20. Thin barium consistently penetrated into the laryngeal vestibule at various levels, sometimes reaching the level of the vocal folds, and in once instance being  aspirated prior to swallow initiation. Pt's cough response was not effective to completely clear aspirates and penetrates. He was unable to trigger volitional coughs or extra dry swallows (due to suspected apraxia/motor deficits) to clear penetrates or vallecular residue, which accumulated from lingual residue of thin which then fell into vallecular sinuses after initial swallow. Pt able to effectively use chin tuck maneuver to prevent aspiration of thin barium, although mild penetration above the vocal folds still observed X1. Nectar thick barium was consumed with timely swallow initiation, minimal vallecular residue (less than thin, perhaps due to increased weight of bolus aiding in inferior transit). Pt also demonstrated ability to consume barium pill whole inside bite of puree without oral or pharyngeal abnormalities. However, in brief scan of esophagus, pill was observed to move slowly and stayed in lower esophagus for >2 mins. Recommend pt continue Dys 3 (mechanical soft) solids, upgrade to nectar thick liquids, pills whole in purees one at a time, follow solids with liquid, small bites/sips, slow rate, remain seated upright for at least 30 mins after intake, full supervision to assist with implementation of swallow strategies. Pt may also begin Frasier Water Protocol WITH CHIN TUCK. ST will continue to follow pt to ensure diet safety, efficiency, and skilled interventions to work toward greater independence with swallow strategies and potential diet advancement.  SLP Visit Diagnosis Dysphagia, oropharyngeal phase (R13.12);Aphasia (R47.01) Attention and concentration deficit following -- Frontal lobe and executive function deficit following -- Impact on safety and function Moderate aspiration risk   CHL IP TREATMENT RECOMMENDATION 09/16/2020 Treatment Recommendations Therapy as outlined in treatment plan below   Prognosis 09/16/2020 Prognosis for Safe Diet Advancement Good Barriers to Reach Goals Cognitive  deficits;Language deficits Barriers/Prognosis Comment -- CHL IP DIET RECOMMENDATION 10/13/2020 SLP Diet Recommendations Dysphagia 3 (Mech soft) solids;Nectar  thick liquid;Other (Comment) Liquid Administration via Cup;Straw Medication Administration Whole meds with puree Compensations Minimize environmental distractions;Small sips/bites;Slow rate;Lingual sweep for clearance of pocketing;Follow solids with liquid;Other (Comment) Postural Changes Remain semi-upright after after feeds/meals (Comment);Seated upright at 90 degrees   CHL IP OTHER RECOMMENDATIONS 10/13/2020 Recommended Consults -- Oral Care Recommendations Oral care BID Other Recommendations --   CHL IP FOLLOW UP RECOMMENDATIONS 09/19/2020 Follow up Recommendations Inpatient Rehab   CHL IP FREQUENCY AND DURATION 09/16/2020 Speech Therapy Frequency (ACUTE ONLY) min 2x/week Treatment Duration 2 weeks      CHL IP ORAL PHASE 10/13/2020 Oral Phase Impaired Oral - Pudding Teaspoon -- Oral - Pudding Cup -- Oral - Honey Teaspoon NT Oral - Honey Cup NT Oral - Nectar Teaspoon NT Oral - Nectar Cup Lingual/palatal residue Oral - Nectar Straw WFL Oral - Thin Teaspoon NT Oral - Thin Cup Decreased bolus cohesion;Piecemeal swallowing;Lingual/palatal residue Oral - Thin Straw -- Oral - Puree NT Oral - Mech Soft NT Oral - Regular -- Oral - Multi-Consistency -- Oral - Pill WFL Oral Phase - Comment --  CHL IP PHARYNGEAL PHASE 10/13/2020 Pharyngeal Phase Impaired Pharyngeal- Pudding Teaspoon -- Pharyngeal -- Pharyngeal- Pudding Cup -- Pharyngeal -- Pharyngeal- Honey Teaspoon NT Pharyngeal -- Pharyngeal- Honey Cup NT Pharyngeal -- Pharyngeal- Nectar Teaspoon NT Pharyngeal -- Pharyngeal- Nectar Cup Pharyngeal residue - valleculae Pharyngeal Material does not enter airway Pharyngeal- Nectar Straw Delayed swallow initiation-vallecula Pharyngeal -- Pharyngeal- Thin Teaspoon NT Pharyngeal -- Pharyngeal- Thin Cup Delayed swallow initiation-vallecula;Penetration/Aspiration before  swallow;Pharyngeal residue - valleculae;Reduced airway/laryngeal closure;Compensatory strategies attempted (with notebox) Pharyngeal Material enters airway, passes BELOW cords and not ejected out despite cough attempt by patient;Material enters airway, CONTACTS cords and not ejected out;Material enters airway, remains ABOVE vocal cords and not ejected out Pharyngeal- Thin Straw -- Pharyngeal -- Pharyngeal- Puree NT Pharyngeal -- Pharyngeal- Mechanical Soft NT Pharyngeal -- Pharyngeal- Regular -- Pharyngeal -- Pharyngeal- Multi-consistency -- Pharyngeal -- Pharyngeal- Pill WFL Pharyngeal -- Pharyngeal Comment --  CHL IP CERVICAL ESOPHAGEAL PHASE 10/13/2020 Cervical Esophageal Phase WFL Pudding Teaspoon -- Pudding Cup -- Honey Teaspoon -- Honey Cup -- Nectar Teaspoon -- Nectar Cup -- Nectar Straw -- Thin Teaspoon -- Thin Cup -- Thin Straw -- Puree -- Mechanical Soft -- Regular -- Multi-consistency -- Pill -- Cervical Esophageal Comment -- [name] 10/13/2020, 12:27 PM              DG Swallowing Func-Speech Pathology  Result Date: 09/28/2020 Objective Swallowing Evaluation: Type of Study: MBS-Modified Barium Swallow Study  Patient Details Name: Jason Romero MRN: [id number] Date of Birth: 1970/11/13 Today's Date: 09/28/2020 Past Medical History: Past Medical History: Diagnosis Date . A-fib (HCC)  . Chronic headaches  . COPD (chronic obstructive pulmonary disease) (HCC)  . GERD (gastroesophageal reflux disease)  . History of blood transfusion  . Hypertension  . MI (myocardial infarction) (HCC)  . Seizures (HCC)  Past Surgical History: Past Surgical History: Procedure Laterality Date . CHOLECYSTECTOMY   HPI: The pt is a 49 yo male presenting with R-sided weakness and found to have intraparenchymal hematoma in the left basal ganglia on original CT. Repeat imaging revealed increased hemorrhage with 3mm midline shift. PMH includes: GERD, seizures, HTN, COPD, MI, afib, and current tobacco use. Admitted to CIR 09/20/20   Subjective: alert, not consistently following commands Assessment / Plan / Recommendation CHL IP CLINICAL IMPRESSIONS 09/28/2020 Clinical Impression -- Although oropharyngeal dysphagia still evident, pt's swallow function is improving in comparison to last MBSS 09/16/20. Pt's mastication of  soft solids was mildly prolonged, however efficient; he demonstrated much greater oral control of boluses, and bolus cohesion of Dys 1 and Dys 3 textures. Aspiration X2 with delayed cough response and deep penetration noted during trials of thin barium, most likely due to pt's delayed initiation of swallow sequence, which occurred at the level of the pyriform sinuses. Penetration to the level of the vocal folds without ejection also noted with nectar. Due to receptive and expressive language and cognitive deficits, pt was unable to follow any commands for compensatory strategies or maneuvers, therefore, not viable option at this time. Honey thick barium resulted in a slightly more timely swallow initiation, and reduction in depth and frequency of penetration into laryngeal vestibule (remained above vocal folds) and some was ejected during swallow). Given results of today's study, would recommend pt begin a Dys 3 (mechanical soft solid) texture diet, honey thick liquids, and medications whole in purees. He will require full supervision during meals to ensure use of swallow strategies and reduce impulsivity during intake. ST will continue to provide skilled interventions to work toward further advancement and increase independence with use of safe swallow strategies.  SLP Visit Diagnosis Dysphagia, oropharyngeal phase (R13.12);Aphasia (R47.01) Attention and concentration deficit following -- Frontal lobe and executive function deficit following -- Impact on safety and function Moderate aspiration risk   CHL IP TREATMENT RECOMMENDATION 09/16/2020 Treatment Recommendations Therapy as outlined in treatment plan below   Prognosis  09/16/2020 Prognosis for Safe Diet Advancement Good Barriers to Reach Goals Cognitive deficits;Language deficits Barriers/Prognosis Comment -- CHL IP DIET RECOMMENDATION 09/28/2020 SLP Diet Recommendations Honey thick liquids;Dysphagia 3 (Mech soft) solids Liquid Administration via Cup Medication Administration Whole meds with puree Compensations Minimize environmental distractions;Small sips/bites;Slow rate;Lingual sweep for clearance of pocketing Postural Changes Remain semi-upright after after feeds/meals (Comment)   CHL IP OTHER RECOMMENDATIONS 09/28/2020 Recommended Consults -- Oral Care Recommendations Oral care QID Other Recommendations Have oral suction available   CHL IP FOLLOW UP RECOMMENDATIONS 09/19/2020 Follow up Recommendations Inpatient Rehab   CHL IP FREQUENCY AND DURATION 09/16/2020 Speech Therapy Frequency (ACUTE ONLY) min 2x/week Treatment Duration 2 weeks      CHL IP ORAL PHASE 09/28/2020 Oral Phase Impaired Oral - Pudding Teaspoon -- Oral - Pudding Cup -- Oral - Honey Teaspoon NT Oral - Honey Cup Delayed oral transit;Lingual/palatal residue Oral - Nectar Teaspoon NT Oral - Nectar Cup Lingual/palatal residue Oral - Nectar Straw -- Oral - Thin Teaspoon NT Oral - Thin Cup Decreased bolus cohesion;Piecemeal swallowing Oral - Thin Straw -- Oral - Puree WFL Oral - Mech Soft Delayed oral transit Oral - Regular -- Oral - Multi-Consistency -- Oral - Pill -- Oral Phase - Comment --  CHL IP PHARYNGEAL PHASE 09/28/2020 Pharyngeal Phase Impaired Pharyngeal- Pudding Teaspoon -- Pharyngeal -- Pharyngeal- Pudding Cup -- Pharyngeal -- Pharyngeal- Honey Teaspoon NT Pharyngeal -- Pharyngeal- Honey Cup Delayed swallow initiation-vallecula;Delayed swallow initiation-pyriform sinuses;Reduced anterior laryngeal mobility;Penetration/Aspiration during swallow Pharyngeal Material enters airway, remains ABOVE vocal cords then ejected out;Material enters airway, remains ABOVE vocal cords and not ejected out Pharyngeal- Nectar  Teaspoon NT Pharyngeal -- Pharyngeal- Nectar Cup Penetration/Aspiration during swallow;Delayed swallow initiation-pyriform sinuses;Reduced anterior laryngeal mobility Pharyngeal Material enters airway, CONTACTS cords and not ejected out Pharyngeal- Nectar Straw -- Pharyngeal -- Pharyngeal- Thin Teaspoon NT Pharyngeal -- Pharyngeal- Thin Cup Penetration/Aspiration during swallow;Reduced anterior laryngeal mobility;Delayed swallow initiation-pyriform sinuses Pharyngeal Material enters airway, passes BELOW cords and not ejected out despite cough attempt by patient Pharyngeal- Thin Straw -- Pharyngeal -- Pharyngeal- Puree  Delayed swallow initiation-vallecula;Reduced anterior laryngeal mobility;Pharyngeal residue - valleculae Pharyngeal -- Pharyngeal- Mechanical Soft Delayed swallow initiation-vallecula;Reduced anterior laryngeal mobility;Pharyngeal residue - valleculae Pharyngeal -- Pharyngeal- Regular -- Pharyngeal -- Pharyngeal- Multi-consistency -- Pharyngeal -- Pharyngeal- Pill -- Pharyngeal -- Pharyngeal Comment --  CHL IP CERVICAL ESOPHAGEAL PHASE 09/28/2020 Cervical Esophageal Phase WFL Pudding Teaspoon -- Pudding Cup -- Honey Teaspoon -- Honey Cup -- Nectar Teaspoon -- Nectar Cup -- Nectar Straw -- Thin Teaspoon -- Thin Cup -- Thin Straw -- Puree -- Mechanical Soft -- Regular -- Multi-consistency -- Pill -- Cervical Esophageal Comment -- Little Ishikawarin E Smith 09/28/2020, 9:56 AM               Labs:  Basic Metabolic Panel: No results for input(s): NA, K, CL, CO2, GLUCOSE, BUN, CREATININE, CALCIUM, MG, PHOS in the last 168 hours.  CBC: No results for input(s): WBC, NEUTROABS, HGB, HCT, MCV, PLT in the last 168 hours.  CBG: No results for input(s): GLUCAP in the last 168 hours.  Family history.  Mother with CAD.  Denies any colon cancer esophageal cancer or rectal cancer  Brief HPI:   Vilma PraderGlenn H Kolodziej is a 49 y.o. right-handed male with history of COPD tobacco abuse hypertension seizures on no seizure  medication atrial fibrillation not on anticoagulation did not follow-up cardiology services.  Per chart review lives with spouse independent prior to admission.  Presented 09/11/2020 with right side weakness facial droop.  Blood pressure 195/127.  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  No shift or hydrocephalus.  CTA of head and neck no stenosis identified.  Echocardiogram with ejection fraction of 50 to 55% no wall motion abnormalities.  Admission chemistries unremarkable except glucose 114 potassium 2.8 urine drug screen positive amphetamines and marijuana, urinalysis negative nitrite.  Neurology follow-up placed on 3% saline.  ICH felt to be related to hypertensive crisis.  Placed on Cleviprex for blood pressure control.  Initially n.p.o. with alternative means of nutritional support.  Therapy evaluations completed and patient was admitted for a comprehensive rehab program   Hospital Course: Vilma PraderGlenn H Zender was admitted to rehab 09/20/2020 for inpatient therapies to consist of PT, ST and OT at least three hours five days a week. Past admission physiatrist, therapy team and rehab RN have worked together to provide customized collaborative inpatient rehab.  Pertaining to patient's left basal ganglia ICH related to hypertensive crisis remained stable he would follow-up neurology services.  SCDs for DVT prophylaxis.  Dysphagia improved diet has been advanced to a mechanical soft nectar thick liquid.  Blood pressure monitored on Norvasc 10 mg daily as well as hydralazine with Normodyne 300 mg every 8 hours and lisinopril 20 mg twice daily.  Patient would need follow-up with primary MD.  History of tobacco marijuana use patient received counsel guard cessation of nicotine and illicit drug use.   Blood pressures were monitored on TID basis and controlled     Rehab course: During patient's stay in rehab weekly team conferences were held to monitor patient's  progress, set goals and discuss barriers to discharge. At admission, patient required moderate assist supine to sit moderate assist sit to supine +2 physical assist sit to stand.  Max assist toilet transfers max assist lower body dressing supervision grooming  Physical exam.  Blood pressure 111/90 pulse 70 temperature 97.5 respirations 20 oxygen saturation 90% room air Constitutional.  No acute distress HEENT Head.  Normocephalic and atraumatic Eyes.  Pupils round and reactive to  light no discharge.nystagmus Neck.  Supple nontender no JVD without thyromegaly Cardiac regular rate rhythm without extra sounds or murmur heard Abdomen.  Soft nontender positive bowel sounds without rebound Respiratory effort normal no respiratory distress without wheeze Musculoskeletal.  No swelling or tenderness.  Normal range of motion. Skin.  Warm and dry Neurologic.  Patient is alert no acute distress demonstrates a left gaze preference.  He is globally aphasic.  He occasionally is able to utter a few words.  Follows simple commands.  Dense right hemiparesis left upper and left lower extremity 4/5.  Right central VII decreased sense of pain in right arm and leg  He/  has had improvement in activity tolerance, balance, postural control as well as ability to compensate for deficits. He/ has had improvement in functional use RUE/LUE  and RLE/LLE as well as improvement in awareness.  Supine to sit moderate assist for right lower extremity upper extremity management minimal assist scooting to edge of bed.  Patient donned shoes AFO socks and pants around knees in sitting max assist.  Sit to stand minimal assist and patient able to complete 80% of lower body dressing.  Stand pivot transfers +2 with rolling walker.  Ambulated 35 feet rolling walker minimal assist.  Completed bed mobility for ADLs to come to sitting edge of bed with minimal assist and cues.  Patient able to thread right lower extremity into pants with assistance  to achieve maintain figure 4 position.  Completed sit to stand edge of bed with rolling walker to pull up pants.  Patient able to calm up to standing with moderate assist and unable to pull up pants over hips with increased time in moderate assist for standing balance.  Wife educated on patient's current expressive and receptive language skills.  Wife provided with handout on strategies for patient and aphasia worksheets.  Full family teaching completed plan discharge to home       Disposition: Discharged to home    Diet: Mechanical soft nectar thick liquids  Special Instructions: No driving smoking or alcohol  Medications at discharge 1.  Tylenol as needed 2.  Norvasc 10 mg daily 3.  Hydralazine 25 mg every 8 hours 4.  Normodyne 300 mg every 8 hours 5.  Lisinopril 20 mg twice daily 6.  Protonix 40 mg daily 7.  Senokot 1 tablet twice daily hold for loose stools  30-35 minutes were spent completing discharge summary and discharge planning  Discharge Instructions    Ambulatory referral to Neurology   Complete by: As directed    An appointment is requested in approximately 4 weeks ICH   Ambulatory referral to Physical Medicine Rehab   Complete by: As directed    Moderate complexity follow-up 1 to 2 weeks ICH related to hypertensive crisis       Follow-up Information    Kirsteins, Victorino Sparrow, MD Follow up.   Specialty: Physical Medicine and Rehabilitation Why: Office to call for appointment Contact information: [address] [place] [address] [place] [address] [phone]               Signed: Charlton Amor 10/19/2020, 5:14 AM

## 2020-10-17 NOTE — Progress Notes (Signed)
Physical Therapy Session Note  Patient Details  Name: Jason Romero MRN: [id number] Date of Birth: 18-Nov-1970  Today's Date: 10/17/2020 PT Individual Time: 9675-9163 PT Individual Time Calculation (min): 72 min   Skilled Therapeutic Interventions/Progress Updates:     Pt received supine in bed and agrees to therapy. No complaint of pain. Wife present for family education. Supine to sit with supervision and use of bed features. PT assists pt with donning shoes and R AFO for time management. Squat pivot transfer to WC with minA. WC transport to gym for energy conservation.  Majority of session focused on car transfers. Initially, pt's wife demonstrates assisting pt with car transfer with PT providing mod verbal and tactile cues for safety. Pt utilizing stand pivot transfer technique without AD. PT then demos transfer for wife, blocking pt's R knee and having pt hold onto car frame for sit to stand. Pt performs this transfer x3 reps with PT with wife observing. Wife then performs additional transfer with improved body mechanics. Pt transported outside via [facility] to perform car transfer with actual car (utilizing VW hatchback as pt's wife did not bring car this session). PT demos assisting pt with transfer, providing modA to complete, then provides step-by-step cuing for wife as she assist pt. Wife verbalizes feeling confident with performance of transfer on DC day.  Pt ambulates x39' in hallway, holding onto L handrail, with R arm draped over PT shoulders, and PT providing modA for progression of R leg and blocking of R knee during stance phase, with manual cuing for lateral weight shifting.  Stand pivot back to bed with minA. Sit to supine with supervision and cues on sequencing. Pt left supine in bed with alarm intact and all needs within reach.  Therapy Documentation Precautions:  Precautions Precautions: Fall Precaution Comments: R hemiplegia, R inattention Restrictions Weight Bearing  Restrictions: No    Therapy/Group: Individual Therapy  Beau Fanny, PT, DPT 10/17/2020, 3:29 PM

## 2020-10-17 NOTE — Progress Notes (Signed)
Nutrition Follow-up  DOCUMENTATION CODES:   Not applicable  INTERVENTION:   - ContinueMagic cup TID with meals, each supplement provides 290 kcal and 9 grams of protein  - Continue double protein portions TID with meals  - Vital Cuisine Shake BID, each supplement provides 520 kcal and 22 grams of protein  - Encourage adequate PO intake and provide feeding assistance as needed  NUTRITION DIAGNOSIS:   Inadequate oral intake related to dysphagia as evidenced by NPO status.  Progressing, pt now on dysphagia 3 diet with nectar thick liquids  GOAL:   Patient will meet greater than or equal to 90% of their needs  Progressing  MONITOR:   PO intake, Supplement acceptance, Diet advancement, Labs, Weight trends, TF tolerance  REASON FOR ASSESSMENT:   Consult Enteral/tube feeding initiation and management  ASSESSMENT:   49 year old male with PMH of COPD, tobacco abuse, HTN, seizures, atrial fibrillation. Presented 09/11/20 with right-sided weakness and facial droop. Cranial CT scan showed intraparenchymal hematoma in the left basal ganglia. ICH felt to be related to hypertensive crisis. Pt was placed on Cleviprex for blood pressure control. Pt is currently NPO with Cortrak in place for tube feeds. Admitted to CIR on 11/09.  11/17 - MBS, diet advanced to dysphagia 3 with honey-thick liquids 11/18 - Cortrak removed 12/02 - MBS, diet advanced to dysphagia 3 with nectar-thick liquids  Target d/c date remains 12/08.  Pt unavailable at time of RD attempted visit. Meal completions overall have improved with most meal completions >/= 90%. Will add Hormel Shakes to lunch and dinner meals now that liquids have been advanced to nectar-thick.  CIR admit weight: 107.2 kg Current weight: 105.2 kg  Meal Completion: 60-100%  Medications reviewed and include: protonix, senna  Labs reviewed.  Diet Order:   Diet Order            DIET DYS 3 Room service appropriate? Yes with Assist;  Fluid consistency: Nectar Thick  Diet effective now                 EDUCATION NEEDS:   Education needs have been addressed  Skin:  Skin Assessment: Reviewed RN Assessment  Last BM:  10/17/20 large type 6  Height:   Ht Readings from Last 1 Encounters:  09/20/20 6' 1.5" (1.867 m)    Weight:   Wt Readings from Last 1 Encounters:  10/17/20 105.2 kg    Ideal Body Weight:     BMI:  Body mass index is 30.18 kg/m.  Estimated Nutritional Needs:   Kcal:  2300-2500  Protein:  115-135 grams  Fluid:  >/= 2.0 L    Mertie Clause, MS, RD, LDN Inpatient Clinical Dietitian Please see [facility]iON for contact information.

## 2020-10-18 ENCOUNTER — Other Ambulatory Visit (HOSPITAL_COMMUNITY): Payer: Self-pay | Admitting: Physician Assistant

## 2020-10-18 ENCOUNTER — Inpatient Hospital Stay (HOSPITAL_COMMUNITY): Payer: Self-pay | Admitting: *Deleted

## 2020-10-18 ENCOUNTER — Inpatient Hospital Stay (HOSPITAL_COMMUNITY): Payer: Self-pay | Admitting: Speech Pathology

## 2020-10-18 ENCOUNTER — Inpatient Hospital Stay (HOSPITAL_COMMUNITY): Payer: Self-pay

## 2020-10-18 ENCOUNTER — Inpatient Hospital Stay (HOSPITAL_COMMUNITY): Payer: Self-pay | Admitting: Occupational Therapy

## 2020-10-18 MED ORDER — SENNA 8.6 MG PO TABS: 1.0000 | ORAL_TABLET | Freq: Two times a day (BID) | ORAL | 0 refills | Status: DC

## 2020-10-18 MED ORDER — ACETAMINOPHEN 325 MG PO TABS: 650.0000 mg | ORAL_TABLET | ORAL | Status: AC | PRN

## 2020-10-18 MED ORDER — LISINOPRIL 20 MG PO TABS: 20.0000 mg | ORAL_TABLET | Freq: Two times a day (BID) | ORAL | 0 refills | Status: DC

## 2020-10-18 MED ORDER — AMLODIPINE BESYLATE 10 MG PO TABS: 10.0000 mg | ORAL_TABLET | Freq: Every day | ORAL | 0 refills | Status: DC

## 2020-10-18 MED ORDER — HYDRALAZINE HCL 25 MG PO TABS: 25.0000 mg | ORAL_TABLET | Freq: Three times a day (TID) | ORAL | 0 refills | Status: DC

## 2020-10-18 MED ORDER — PANTOPRAZOLE SODIUM 40 MG PO TBEC: 40.0000 mg | DELAYED_RELEASE_TABLET | Freq: Every day | ORAL | 0 refills | Status: DC

## 2020-10-18 MED ORDER — LABETALOL HCL 300 MG PO TABS: 300.0000 mg | ORAL_TABLET | Freq: Three times a day (TID) | ORAL | 0 refills | Status: DC

## 2020-10-18 MED FILL — hydrALAZINE HCL 25 MG TABS: 25 | 30 days supply | Qty: 90 | Fill #0

## 2020-10-18 MED FILL — LISINOPRIL 20 MG TABLET: 20 | 30 days supply | Qty: 60 | Fill #0

## 2020-10-18 MED FILL — AMLODIPINE BESYLATE 10 MG T: 10 | 30 days supply | Qty: 30 | Fill #0

## 2020-10-18 MED FILL — LABETALOL HCL 300 MG TABLET: 300 | 30 days supply | Qty: 90 | Fill #0

## 2020-10-18 MED FILL — PANTOPRAZOLE SOD DR 40 MG T: 40 | 30 days supply | Qty: 30 | Fill #0

## 2020-10-18 NOTE — Progress Notes (Signed)
Patient ID: Jason Romero, male   DOB: 1971/08/05, 49 y.o.   MRN: [id number]   Patient accepted by [facility] for [facility] follow up. Patient will receive PT/OT/SPH  Lavera Guise, BSW [phone]

## 2020-10-18 NOTE — Progress Notes (Signed)
PHYSICAL MEDICINE & REHABILITATION PROGRESS NOTE   Subjective/Complaints:  Pt wants meds spread out at noc Discussed d/c in am   ROS: limited due to language/communication    Objective:   No results found. No results for input(s): WBC, HGB, HCT, PLT in the last 72 hours. No results for input(s): NA, K, CL, CO2, GLUCOSE, BUN, CREATININE, CALCIUM in the last 72 hours.  Intake/Output Summary (Last 24 hours) at 10/18/2020 0839 Last data filed at 10/18/2020 0700 Gross per 24 hour  Intake 720 ml  Output --  Net 720 ml        Physical Exam: Vital Signs Blood pressure (!) 155/100, pulse 80, temperature 97.9 F (36.6 C), temperature source Oral, resp. rate 16, height 6' 1.5" (1.867 m), weight 103.7 kg, SpO2 98 %.   General: No acute distress Mood and affect are appropriate Heart: Regular rate and rhythm no rubs murmurs or extra sounds Lungs: Clear to auscultation, breathing unlabored, no rales or wheezes Abdomen: Positive bowel sounds, soft nontender to palpation, nondistended Extremities: No clubbing, cyanosis, or edema Skin: No evidence of breakdown, no evidence of rash  Ex>rec aphasia , able to name simple objects Left side 5/5 in LUE and LLE Motor: RUE: 0/5 distal, 2- RIght shoulder adduction, trace biceps ---no change RLE: Hip flexion, knee extension 3-/5 distally 0/5 --no change  Assessment/Plan: 1. Functional deficits which require 3+ hours per day of interdisciplinary therapy in a comprehensive inpatient rehab setting.  Physiatrist is providing close team supervision and 24 hour management of active medical problems listed below.  Physiatrist and rehab team continue to assess barriers to discharge/monitor patient progress toward functional and medical goals  Care Tool:  Bathing    Body parts bathed by patient: Right arm, Chest, Abdomen, Front perineal area, Left upper leg, Face, Right upper leg, Left lower leg, Buttocks, Right lower leg, Left arm    Body parts bathed by helper: Left arm, Buttocks, Right lower leg     Bathing assist Assist Level: Contact Guard/Touching assist     Upper Body Dressing/Undressing Upper body dressing   What is the patient wearing?: Pull over shirt    Upper body assist Assist Level: Minimal Assistance - Patient > 75%    Lower Body Dressing/Undressing Lower body dressing      What is the patient wearing?: Pants, Incontinence brief     Lower body assist Assist for lower body dressing: Moderate Assistance - Patient 50 - 74%     Toileting Toileting Toileting Activity did not occur Press photographer and hygiene only):  (2 assit stedy)  Toileting assist Assist for toileting: Maximal Assistance - Patient 25 - 49%     Transfers Chair/bed transfer  Transfers assist     Chair/bed transfer assist level: Minimal Assistance - Patient > 75%     Locomotion Ambulation   Ambulation assist   Ambulation activity did not occur: Safety/medical concerns  Assist level: 2 helpers Assistive device: Other (comment) (hand rail and HHA) Max distance: 39'   Walk 10 feet activity   Assist  Walk 10 feet activity did not occur: Safety/medical concerns  Assist level: 2 helpers Assistive device: Other (comment) (hand rail and HHA)   Walk 50 feet activity   Assist Walk 50 feet with 2 turns activity did not occur: Safety/medical concerns         Walk 150 feet activity   Assist Walk 150 feet activity did not occur: Safety/medical concerns  Walk 10 feet on uneven surface  activity   Assist Walk 10 feet on uneven surfaces activity did not occur: Safety/medical concerns         Wheelchair     Assist Will patient use wheelchair at discharge?: Yes Type of Wheelchair: Manual    Wheelchair assist level: Minimal Assistance - Patient > 75%, Supervision/Verbal cueing Max wheelchair distance: >150 ft    Wheelchair 50 feet with 2 turns activity    Assist         Assist Level: Minimal Assistance - Patient > 75%, Supervision/Verbal cueing   Wheelchair 150 feet activity     Assist      Assist Level: Minimal Assistance - Patient > 75%, Supervision/Verbal cueing   Blood pressure (!) 155/100, pulse 80, temperature 97.9 F (36.6 C), temperature source Oral, resp. rate 16, height 6' 1.5" (1.867 m), weight 103.7 kg, SpO2 98 %.    Medical Problem List and Plan: 1.  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  Antithrombotics: -DVT/anticoagulation: SCDs             -antiplatelet therapy: N/A 3. Pain Management: Tylenol as needed.   Appears to be controlled on 11/30 4. Mood: Provide emotional support             -antipsychotic agents: N/A 5. Neuropsych: This patient is not capable of making decisions on his own behalf. 6. Skin/Wound Care: Routine skin checks 7. Fluids/Electrolytes/Nutrition: Routine in and outs  BMP within acceptable range on 11/26  8.  Post stroke dysphagia.    D3 honey, advance as tolerated 9.  Hypertension.  Norvasc 10 mg daily, Normodyne 300 mg every 8 hours, lisinopril 20 mg twice daily.  Monitor with increased mobility  Hydralazine decreased to 25 3 times daily Vitals:   10/17/20 2108 10/18/20 0527  BP: 119/79 (!) 155/100  Pulse: 90 80  Resp: 18 16  Temp:  97.9 F (36.6 C)  SpO2: 98% 98%   Controlled 12/6 but elevated this am, likely pre am BP meds  10.  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  Provide counseling when appropriate 11.  Obesity.  BMI 30.67-->31.36-->31.02  Dietary follow-up  12. Incontinence of bowel and bladder  Trouble using call bell also is aphasic, cont efforts at timed toileting, pt now pointing to bathroom 13.  Right spastic hemiplegia  May need oral meds if this worsens  Wears WHO, wore PRAFO  overnight 14.  Acute blood loss anemia  Hemoglobin 11.6 on 11/26  Continue to monitor  LOS: 28 days A FACE TO FACE EVALUATION WAS PERFORMED  Erick Colace 10/18/2020, 8:39 AM

## 2020-10-18 NOTE — Progress Notes (Signed)
Speech Language Pathology Discharge Summary  Patient Details  Name: Jason Romero MRN: [id number] Date of Birth: 1971/04/23  Today's Date: 10/18/2020 SLP Individual Time: 0730-0828 SLP Individual Time Calculation (min): 58 min   Skilled Therapeutic Interventions:  Pt was seen for skilled ST targeting speech/language and swallow goals. SLP provided overall Supervision A level verbal cues for use of compensatory swallow strategies (mostly slower rate) during intake of current diet breakfast tray (nectar/dys 3). He exhibited 1 delayed cough. After oral care, he accepted ~2oz thin H2O with Min A verbal and visual cues for accuracy in use of chin tuck. No overt s/sx aspiration observed. Recommend continue current diet (dys 3/nectar/water protocol). During structured language tasks, pt named items with 100% accuracy when provided with Moderate sematic, sentence completion, and phonemic cueing. Responsive naming was 75% accurate with same level of cueing. During divergent naming task, he named 5 animals within ~8 minutes and Mod A semantic cues and cues for word finding strategies. Throughout session he communicated his basic wants and needs with overall Min A question cues and cues for word finding. He was somewhat perseverative on trying to communicate his frustration with timing of medication administration and  required Min A verbal cues to redirect to more functional tasks. Pt left laying in bed with alarm set and needs within reach. Continue per current plan of care.    Patient has met 8 of 8 long term goals.  Patient to discharge at overall Min;Mod level.  Reasons goals not met: n/a   Clinical Impression/Discharge Summary:   Pt made functional gains and met 8 out of 8 long term goals this admission. Pt currently requires Min assist to express his basic wants and needs via a combination of verbal expression and gestures. Increased Mod A verbal cueing is required for accuracy in verbal expression and  word finding during more structured language tasks, as well as to express longer and more complex messages (due to expressive aphasia). He is highly aware of verbal errors, and most responsive to orthographic (written) cueing, but also responds to sentence completion, semantic, and occasionally phonemic cues. He also requires Min A verbal cues for redirection to tasks due to attention deficits, and is still mildly impulsive which decreases safety awareness (although there has been a drastic improvement in this area since admission). Due to current deficits, he will require strict 24/7 supervision and hands on care at discharge. Pt is consuming a dysphagia 3 (mechaical soft) diet with nectar thick liquids, and uses strategies for oral clearance and reducing aspiration risk with Supervision A. Increased Min A cueing is required for accuracy in use of chin tuck when is is drinking water per the [employer] Protocol (which it is recommended he be able to continue at home). Pt has demonstrated improved verbal expression, comprehension, attention, emergent awareness, and oropharyngeal swallow function throughout admission. However, given continued dysphagia, expressive aphasia, and cognitive deficits still present, recommend pt continue to receive skilled [place] services upon discharge. Pt and family education has been ongoing and is complete at this time.   Care Partner:  Caregiver Able to Provide Assistance: Yes  Type of Caregiver Assistance: Cognitive  Recommendation:  24 hour supervision/assistance;Home Health SLP  Rationale for SLP Follow Up: Maximize functional communication;Maximize cognitive function and independence;Reduce caregiver burden;Maximize swallowing safety   Equipment: none   Reasons for discharge: Discharged from hospital   Patient/Family Agrees with Progress Made and Goals Achieved: Yes    Arbutus Leas 10/18/2020, 10:59 AM

## 2020-10-18 NOTE — Progress Notes (Signed)
Physical Therapy Session Note  Patient Details  Name: Jason Romero MRN: [id number] Date of Birth: [birth year]  Today's Date: 10/18/2020 PT Individual Time: 1500-1528 PT Individual Time Calculation (min): 28 min   Short Term Goals: Week 3:  PT Short Term Goal 1 (Week 3): =LTGs  Skilled Therapeutic Interventions/Progress Updates:    Pt greeted seated in recliner with wife at bedside. RN also present as pt was expressing frustration regarding an event that occurred early in the AM with a staff member, expressive aphasia limiting descriptors. After RN and patient resolved this matter, lengthy conversation held with both patient and wife regarding home safety training, DC planning, and role of f/u therapies. Also discussed importance of pressure relief in both sitting/supine, pressure relief techniques (tilting chair, leaning, crossing legs) and using pillows in bed to offload bony prominences. Discussed what to do if fall occurs and importance of carrying a cell phone with him at all times in case of emergency or fall. All questions and concerns addressed from patient and family and they verbalize readiness for safe DC home where plenty of family will provide help. Pt ended session seated in w/c with safety belt alarm on and needs in reach with wife at bedside.  Therapy Documentation Precautions:  Precautions Precautions: Fall Precaution Comments: R hemiplegia, R inattention Restrictions Weight Bearing Restrictions: No  Therapy/Group: Individual Therapy  Cisco Kindt P Britley Gashi PT 10/18/2020, 7:57 AM

## 2020-10-18 NOTE — Progress Notes (Signed)
Occupational Therapy Session Note  Patient Details  Name: Jason Romero MRN: [id number] Date of Birth: 26-May-1971  Today's Date: 10/18/2020 OT Individual Time: 1257-1359 OT Individual Time Calculation (min): 62 min    Short Term Goals: Week 2:  OT Short Term Goal 1 (Week 2): Patient will maintain static sitting balance at EOB with Min A in prep for ADLs. OT Short Term Goal 1 - Progress (Week 2): Met OT Short Term Goal 2 (Week 2): Patient complete 1/3 parts of LB dressing task seated EOB without lateral LOB. OT Short Term Goal 2 - Progress (Week 2): Progressing toward goal OT Short Term Goal 3 (Week 2): Patient will complete 2/3 parts of UB dressing task with use of hemi techinque. OT Short Term Goal 3 - Progress (Week 2): Met OT Short Term Goal 4 (Week 2): Patient will complete squat-pivot transfers in 2 consecutive treatment sessions with Mod A and no more than 3 cues for safety awareness. OT Short Term Goal 4 - Progress (Week 2): Met Week 3:  OT Short Term Goal 1 (Week 3): STGs=LTGs set at Mod A overall   Skilled Therapeutic Interventions/Progress Updates:    Pt greeted at time of session sitting up in wheelchair agreeable to OT session with wife Jason Romero present who remained throughout session. Pt declined ADL as he had showered yesterday, did not want to change clothes but agreeable to gym. Transported via wheelchair for time management to gym and squat pivot toward R side with Mod A to Nustep, modifications made with gait belt to prevent hip external rotation, ace wrap on R hand and theraband on R foot and pt able to perform 10 minutes on level 5 with 2 rest breaks throughout. BP checked and WNL with activity. Squat pivot back to wheelchair toward L side with Min A. Pt performed Wii game in standing and sitting for bowling activity to improve standing balance and provide NMR for LLE, pt able to stand for approx 3-4 minute intervals to participate with RUE on bariatric RW with hand  splint. Pt did fatigue and had difficulty with RLE buckling toward end of session but he and wife are aware that most activities at home can be done seated for safety with stands for LB ADLs. Transported back to room and alarm on, call bell in reach. Pt and wife had no questions and feel good about DC home tomororw.   Therapy Documentation Precautions:  Precautions Precautions: Fall Precaution Comments: R hemiplegia, R inattention Restrictions Weight Bearing Restrictions: No     Therapy/Group: Individual Therapy  Viona Gilmore 10/18/2020, 5:09 PM

## 2020-10-18 NOTE — Discharge Instructions (Signed)
Inpatient Rehab Discharge Instructions  Jason Romero [facility] Discharge date and time: No discharge date for patient encounter.   Activities/Precautions/ Functional Status: Activity: activity as tolerated Diet: Mechanical soft thin liquids Wound Care: Routine skin checks Functional status:  ___ No restrictions     ___ Walk up steps independently ___ 24/7 supervision/assistance   ___ Walk up steps with assistance ___ Intermittent supervision/assistance  ___ Bathe/dress independently ___ Walk with walker     _x__ Bathe/dress with assistance ___ Walk Independently    ___ Shower independently ___ Walk with assistance    ___ Shower with assistance ___ No alcohol     ___ Return to work/school ________  COMMUNITY REFERRALS UPON DISCHARGE:    Home Health:   PT     OT     ST                    Agency: [facility] Phone: [phone]    Medical Equipment/Items Ordered: Wheelchair, Agricultural consultant, Half Lap Tray, Bari Drop Arm                                                 Agency/Supplier: [employer]   Special Instructions: Hospital follow up appointment scheduled with [facility] with Georgian Co, PA on December 29, 2021at 9:30 AM No driving smoking alcohol or illicit drug use STROKE/TIA DISCHARGE INSTRUCTIONS SMOKING Cigarette smoking nearly doubles your risk of having a stroke & is the single most alterable risk factor  If you smoke or have smoked in the last 12 months, you are advised to quit smoking for your health.  Most of the excess cardiovascular risk related to smoking disappears within a year of stopping.  Ask you doctor about anti-smoking medications  [place] Quit Line: 1-800-QUIT NOW  Free Smoking Cessation Classes [phone]  CHOLESTEROL Know your levels; limit fat & cholesterol in your diet  Lipid Panel     Component Value Date/Time   CHOL 122 09/12/2020 0513   TRIG 104 09/12/2020 0513   TRIG 114 09/12/2020 0513   HDL 40 (L)  09/12/2020 0513   CHOLHDL 3.1 09/12/2020 0513   VLDL 21 09/12/2020 0513   LDLCALC 61 09/12/2020 0513      Many patients benefit from treatment even if their cholesterol is at goal.  Goal: Total Cholesterol (CHOL) less than 160  Goal:  Triglycerides (TRIG) less than 150  Goal:  HDL greater than 40  Goal:  LDL (LDLCALC) less than 100   BLOOD PRESSURE American Stroke Association blood pressure target is less that 120/80 mm/Hg  Your discharge blood pressure is:  BP: (!) 120/99  Monitor your blood pressure  Limit your salt and alcohol intake  Many individuals will require more than one medication for high blood pressure  DIABETES (A1c is a blood sugar average for last 3 months) Goal HGBA1c is under 7% (HBGA1c is blood sugar average for last 3 months)  Diabetes: No known diagnosis of diabetes    Lab Results  Component Value Date   HGBA1C 5.6 09/12/2020     Your HGBA1c can be lowered with medications, healthy diet, and exercise.  Check your blood sugar as directed by your physician  Call your physician if you experience unexplained or low blood sugars.  PHYSICAL ACTIVITY/REHABILITATION Goal is 30 minutes at least 4 days  per week  Activity: Increase activity slowly, Therapies: Physical Therapy: Home Health Return to work:   Activity decreases your risk of heart attack and stroke and makes your heart stronger.  It helps control your weight and blood pressure; helps you relax and can improve your mood.  Participate in a regular exercise program.  Talk with your doctor about the best form of exercise for you (dancing, walking, swimming, cycling).  DIET/WEIGHT Goal is to maintain a healthy weight  Your discharge diet is:  Diet Order            Diet NPO time specified  Diet effective now                 liquids Your height is:  Height: 6' 1.5" (186.7 cm) Your current weight is: Weight: 106 kg Your Body Mass Index (BMI) is:  BMI (Calculated): 30.41  Following the type of  diet specifically designed for you will help prevent another stroke.  Your goal weight range is:    Your goal Body Mass Index (BMI) is 19-24.  Healthy food habits can help reduce 3 risk factors for stroke:  High cholesterol, hypertension, and excess weight.  RESOURCES Stroke/Support Group:  Call [phone]   STROKE EDUCATION PROVIDED/REVIEWED AND GIVEN TO PATIENT Stroke warning signs and symptoms How to activate emergency medical system (call 911). Medications prescribed at discharge. Need for follow-up after discharge. Personal risk factors for stroke. Pneumonia vaccine given:  Flu vaccine given:  My questions have been answered, the writing is legible, and I understand these instructions.  I will adhere to these goals & educational materials that have been provided to me after my discharge from the hospital.      My questions have been answered and I understand these instructions. I will adhere to these goals and the provided educational materials after my discharge from the hospital.  Patient/Caregiver Signature _______________________________ Date __________  Clinician Signature _______________________________________ Date __________  Please bring this form and your medication list with you to all your follow-up doctor's appointments.

## 2020-10-18 NOTE — Progress Notes (Signed)
Physical Therapy Discharge Summary  Patient Details  Name: Jason Romero MRN: [id number] Date of Birth: 12/28/1970  Patient has met 8 of 8 long term goals due to improved activity tolerance, improved balance, improved postural control, increased strength, improved attention, improved awareness and improved coordination.  Patient to discharge at a wheelchair level Supervision.   Patient's care partner is independent to provide the necessary physical and cognitive assistance at discharge.  Reasons goals not met: N/A all goals met  Recommendation:  Patient will benefit from ongoing skilled PT services in home health setting to continue to advance safe functional mobility, address ongoing impairments in R HB weakness, balance deficits, gait impairments, and safety awareness in order to minimize fall risk.  Equipment: Standard 20x18 w/c  Reasons for discharge: treatment goals met  Patient/family agrees with progress made and goals achieved: Yes  PT Discharge Precautions/Restrictions Precautions Precautions: Fall Precaution Comments: R hemiplegia, R inattention Restrictions Weight Bearing Restrictions: No Pain Pain Assessment Pain Scale: 0-10 Pain Score: 0-No pain Vision/Perception  Perception Perception: Impaired Inattention/Neglect: Does not attend to right visual field;Does not attend to right side of body Praxis Praxis: Impaired Praxis Impairment Details: Motor planning  Cognition Overall Cognitive Status: Impaired/Different from baseline Arousal/Alertness: Awake/alert Orientation Level: Oriented X4 Attention: Sustained Sustained Attention: Impaired Sustained Attention Impairment: Verbal basic;Functional basic Memory: Appears intact Awareness: Impaired Awareness Impairment: Emergent impairment Problem Solving: Impaired Problem Solving Impairment: Verbal basic;Functional basic Executive Function: Organizing;Decision Conservation officer, historic buildings: Impaired Organizing Impairment:  Functional basic Decision Making: Impaired Decision Making Impairment: Functional basic Behaviors: Impulsive Safety/Judgment: Impaired Comments: although improving, pt is still slightly impulsive and with decreased safety awareness Sensation Sensation Light Touch: Impaired Detail Light Touch Impaired Details: Absent RUE;Absent RLE Proprioception: Impaired Detail Proprioception Impaired Details: Absent RLE;Impaired RLE Coordination Gross Motor Movements are Fluid and Coordinated: No Fine Motor Movements are Fluid and Coordinated: No Motor  Motor Motor: Abnormal tone;Hemiplegia Motor - Discharge Observations: Continues to demonstrate dense R hemi with increased tone in R hemibody  Mobility Bed Mobility Bed Mobility: Supine to Sit;Sit to Supine Supine to Sit: Minimal Assistance - Patient > 75% Sit to Supine: Supervision/Verbal cueing (per staff report) Transfers Transfers: Sit to Stand;Stand Pivot Transfers;Squat Pivot Transfers Sit to Stand: Moderate Assistance - Patient 50-74% Stand Pivot Transfers: Moderate Assistance - Patient 50 - 74% (to left) Stand Pivot Transfer Details: Verbal cues for sequencing;Tactile cues for weight shifting;Tactile cues for sequencing;Tactile cues for posture;Tactile cues for placement Squat Pivot Transfers: Minimal Assistance - Patient > 75% Transfer (Assistive device): None Locomotion  Gait Ambulation: Yes Gait Assistance: 2 Helpers Gait Distance (Feet): 30 Feet Assistive device: Other (Comment) (wall rail) Gait Assistance Details: Manual facilitation for placement;Manual facilitation for weight shifting Gait Gait: Yes Gait Pattern: Impaired Gait Pattern: Decreased weight shift to right;Decreased dorsiflexion - right;Decreased hip/knee flexion - right;Decreased stance time - right Stairs / Additional Locomotion Stairs: No Wheelchair Mobility Wheelchair Mobility: Yes Wheelchair Assistance: Supervision/Verbal cueing Wheelchair Propulsion:  Left upper extremity;Left lower extremity Wheelchair Parts Management: Needs assistance Distance: 120f  Trunk/Postural Assessment  Cervical Assessment Cervical Assessment: Exceptions to [facility] Thoracic Assessment: Exceptions to [facility](rounded shoulders) Lumbar Assessment Lumbar Assessment: Exceptions to [facility](posterior pelvic tilt) Postural Control Postural Control: Deficits on evaluation Righting Reactions: delayed on R, improved from eval Protective Responses: absent on R  Balance Balance Balance Assessed: Yes Static Sitting Balance Static Sitting - Balance Support: Feet supported Static Sitting - Level of Assistance: 5: Stand by assistance Dynamic Sitting Balance Dynamic Sitting -  Balance Support: Feet supported Dynamic Sitting - Level of Assistance: 5: Stand by assistance Reach (Patient is able to reach ___ inches to right, left, forward, back): Pt reached for object on floor from w/c. Dynamic Sitting - Balance Activities: Ball toss Sitting balance - Comments: pt able to reach L/R with LUE without LOB during ball taps Static Standing Balance Static Standing - Balance Support: Bilateral upper extremity supported Static Standing - Level of Assistance: 4: Min assist Dynamic Standing Balance Dynamic Standing - Balance Support: Right upper extremity supported;During functional activity Dynamic Standing - Level of Assistance: 3: Mod assist Dynamic Standing - Balance Activities: Reaching for objects;Reaching across midline Dynamic Standing - Comments: Standing with RW to perform reaching activities Extremity Assessment  RUE Assessment RUE Assessment: Exceptions to [facility] (flaccid) LUE Assessment LUE Assessment: Within Functional Limits RLE Assessment RLE Assessment: Exceptions to [facility] RLE Strength Right Hip Flexion: 3/5 Right Hip Extension: 2+/5 Right Hip ABduction: 0/5 Right Hip ADduction: 2-/5 Right Knee Flexion: 2/5 Right Knee Extension: 3+/5 Right Ankle  Dorsiflexion: 0/5 Right Ankle Plantar Flexion: 0/5 LLE Assessment LLE Assessment: Within Functional Limits    [facility] PT, DPT 10/18/2020, 1:02 PM

## 2020-10-18 NOTE — Progress Notes (Signed)
Physical Therapy Session Note  Patient Details  Name: Jason Romero MRN: [id number] Date of Birth: 1971-06-22  Today's Date: 10/18/2020 PT Individual Time: 1000-1056 PT Individual Time Calculation (min): 56 min   Short Term Goals: Week 3:  PT Short Term Goal 1 (Week 3): =LTGs  Skilled Therapeutic Interventions/Progress Updates: Pt presented in bed agreeable to therapy. Pt denies pain during session. Performed supine to sit with minA and use of bed features to come to EOB. PTA donned pants modA and donned shoes/AFO total A for time management. Pt donned tank top modA and pt then performed STS modA as pt attempted to pull up from RW vs pushing up from bed. Once in standing pt pulled pants over hips with minA and returned to sitting. Pt then performed squat pivot to R with minA with PTA providing max cues to slow down and control movements. Pt transported to rehab gym hallway and participated in ambulation at wall 64ft with modA. Pt was able to advance RLE 100% of time but did require mod vc's and facilitation to wt shift to R as well as maintaining TKE in stance phase. Pt then transported to rehab gym and performed stand pivot to mat with modA. Pt participated in standing activity reaching and placing cards on board with RW for dynamic balance. With verbal cues to push from mat pt was able to perform STS with minA and performing reaching task with minA and verbal cues for slowing down movement. Pt then performed toe taps to 2in step for wt shifting and forced use of  RLE. With MAX verbal cues pt able to remove RLE from step with use of glutes. After seated rest pt performed ball taps with LUE for dynamic reaching and tolerating moderate challenges laterally with pt able to recover without LOB. Pt then performed squat pivot with minA to w/c and propelled back to room with supervision and verbal cues for negotiating objects on R side. Pt agreeable to remain in w/c at end of session and left with belt alarm  on, call bell within reach and half lap tray placed to provide pt with support for RUE.      Therapy Documentation Precautions:  Precautions Precautions: Fall Precaution Comments: R hemiplegia, R inattention Restrictions Weight Bearing Restrictions: No General:   Vital Signs:  Pain: Pain Assessment Pain Scale: 0-10 Pain Score: 0-No pain Mobility: Bed Mobility Bed Mobility: Supine to Sit;Sit to Supine Supine to Sit: Minimal Assistance - Patient > 75% Sit to Supine: Supervision/Verbal cueing (per staff report) Transfers Transfers: Sit to Stand;Stand Pivot Transfers;Squat Pivot Transfers Sit to Stand: Moderate Assistance - Patient 50-74% Stand Pivot Transfers: Moderate Assistance - Patient 50 - 74% (to left) Stand Pivot Transfer Details: Verbal cues for sequencing;Tactile cues for weight shifting;Tactile cues for sequencing;Tactile cues for posture;Tactile cues for placement Squat Pivot Transfers: Minimal Assistance - Patient > 75% Transfer (Assistive device): None Locomotion : Gait Ambulation: Yes Gait Assistance: 2 Helpers Gait Distance (Feet): 30 Feet Assistive device: Other (Comment) (wall rail) Gait Assistance Details: Manual facilitation for placement;Manual facilitation for weight shifting Gait Gait: Yes Gait Pattern: Impaired Gait Pattern: Decreased weight shift to right;Decreased dorsiflexion - right;Decreased hip/knee flexion - right;Decreased stance time - right Stairs / Additional Locomotion Stairs: No Wheelchair Mobility Wheelchair Mobility: Yes Wheelchair Assistance: Supervision/Verbal cueing Wheelchair Propulsion: Left upper extremity;Left lower extremity Wheelchair Parts Management: Needs assistance Distance: 165ft  Trunk/Postural Assessment : Cervical Assessment Cervical Assessment: Exceptions to [facility] Thoracic Assessment Thoracic Assessment: Exceptions to [facility] (rounded shoulders)  Lumbar Assessment Lumbar Assessment: Exceptions to [facility] (posterior  pelvic tilt) Postural Control Postural Control: Deficits on evaluation Righting Reactions: delayed on R, improved from eval Protective Responses: absent on R  Balance: Balance Balance Assessed: Yes Static Sitting Balance Static Sitting - Balance Support: Feet supported Static Sitting - Level of Assistance: 5: Stand by assistance Dynamic Sitting Balance Dynamic Sitting - Balance Support: Feet supported Dynamic Sitting - Level of Assistance: 5: Stand by assistance Reach (Patient is able to reach ___ inches to right, left, forward, back): Pt reached for object on floor from w/c. Dynamic Sitting - Balance Activities: Ball toss Sitting balance - Comments: pt able to reach L/R with LUE without LOB during ball taps Static Standing Balance Static Standing - Balance Support: Bilateral upper extremity supported Static Standing - Level of Assistance: 4: Min assist Dynamic Standing Balance Dynamic Standing - Balance Support: Right upper extremity supported;During functional activity Dynamic Standing - Level of Assistance: 3: Mod assist Dynamic Standing - Balance Activities: Reaching for objects;Reaching across midline Dynamic Standing - Comments: Standing with RW to perform reaching activities Exercises:   Other Treatments:      Therapy/Group: Individual Therapy  Durelle Zepeda 10/18/2020, 3:53 PM

## 2020-10-19 NOTE — Progress Notes (Signed)
Inpatient Rehabilitation Care Coordinator  Discharge Note  The overall goal for the admission was met for:   Discharge location: Yes, home  Length of Stay: Yes, 29 Days  Discharge activity level: Yes, wheelchair level Supervision  Home/community participation: Yes  Services provided included: MD, RD, PT, OT, SLP, RN, CM, TR, Pharmacy, [place]: Private Insurance: Medicaid Pending/Potential  Follow-up services arranged: Home Health: [facility]  Comments (or additional information): PT OT [facility], Wheelchair, [place], Half Lap Tray, Producer, television/film/video  Patient/Family verbalized understanding of follow-up arrangements: Yes  Individual responsible for coordination of the follow-up plan: [name] (spouse), [phone]  Confirmed correct DME delivered: Dyanne Iha 10/19/2020    Dyanne Iha

## 2020-10-19 NOTE — Plan of Care (Signed)
  Problem: Consults Goal: RH STROKE PATIENT EDUCATION Description: See Patient Education module for education specifics  Outcome: Completed/Met Goal: Nutrition Consult-if indicated Outcome: Completed/Met   Problem: RH BOWEL ELIMINATION Goal: RH STG MANAGE BOWEL WITH ASSISTANCE Description: STG Manage Bowel with min Assistance. Outcome: Completed/Met Goal: RH STG MANAGE BOWEL W/MEDICATION W/ASSISTANCE Description: STG Manage Bowel with Medication with mod I Assistance. Outcome: Completed/Met   Problem: RH BLADDER ELIMINATION Goal: RH STG MANAGE BLADDER WITH ASSISTANCE Description: STG Manage Bladder With min Assistance Outcome: Completed/Met   Problem: RH SKIN INTEGRITY Goal: RH STG SKIN FREE OF INFECTION/BREAKDOWN Description: Skin will be free of infection/breakdown with min assist Outcome: Completed/Met Goal: RH STG MAINTAIN SKIN INTEGRITY WITH ASSISTANCE Description: STG Maintain Skin Integrity With min Assistance. Outcome: Completed/Met   Problem: RH SAFETY Goal: RH STG ADHERE TO SAFETY PRECAUTIONS W/ASSISTANCE/DEVICE Description: STG Adhere to Safety Precautions With cues/reminders Assistance/Device. Outcome: Completed/Met   Problem: RH COGNITION-NURSING Goal: RH STG USES MEMORY AIDS/STRATEGIES W/ASSIST TO PROBLEM SOLVE Description: STG Uses Memory Aids/Strategies With cues/reminders Assistance to Problem Solve. Outcome: Completed/Met   Problem: RH KNOWLEDGE DEFICIT Goal: RH STG INCREASE KNOWLEDGE OF HYPERTENSION Description: Pt/family will increase knowledge of hypertension with cues/reminders assist from handouts and other learning materials provided and use of medications with dietary modifications and lifestyle modifications Outcome: Completed/Met Goal: RH STG INCREASE KNOWLEDGE OF STROKE PROPHYLAXIS Description: Pt/family will be able to increase knowledge of stroke prophylaxis with cues/reminders assist from handouts and other materials provided for  medications Outcome: Completed/Met

## 2020-10-19 NOTE — Progress Notes (Signed)
[facility] PHYSICAL MEDICINE & REHABILITATION PROGRESS NOTE   Subjective/Complaints:  No issues overnight pt excited about d/c  ROS: limited due to language/communication    Objective:   No results found. No results for input(s): WBC, HGB, HCT, PLT in the last 72 hours. No results for input(s): NA, K, CL, CO2, GLUCOSE, BUN, CREATININE, CALCIUM in the last 72 hours.  Intake/Output Summary (Last 24 hours) at 10/19/2020 0902 Last data filed at 10/18/2020 1700 Gross per 24 hour  Intake 600 ml  Output --  Net 600 ml        Physical Exam: Vital Signs Blood pressure (!) 148/102, pulse 90, temperature 97.9 F (36.6 C), resp. rate 18, height 6' 1.5" (1.867 m), weight 103.2 kg, SpO2 98 %.   General: No acute distress Mood and affect are appropriate Heart: Regular rate and rhythm no rubs murmurs or extra sounds Lungs: Clear to auscultation, breathing unlabored, no rales or wheezes Abdomen: Positive bowel sounds, soft nontender to palpation, nondistended Extremities: No clubbing, cyanosis, or edema Skin: No evidence of breakdown, no evidence of rash  Ex>rec aphasia , able to name simple objects Left side 5/5 in LUE and LLE Motor: RUE: 0/5 distal, 2- RIght shoulder adduction, trace biceps ---no change RLE: Hip flexion, knee extension 3-/5 distally 0/5 --no change  Assessment/Plan: 1. Functional deficits  Due to L parietal ICH Stable for D/C today F/u PCP in 3-4 weeks F/u PM&R 2 weeks F/u Neuro 1-2 mo See D/C summary See D/C instructions Care Tool:  Bathing    Body parts bathed by patient: Right arm, Chest, Abdomen, Front perineal area, Left upper leg, Face, Right upper leg, Left lower leg, Buttocks, Right lower leg, Left arm   Body parts bathed by helper: Left arm, Buttocks, Right lower leg     Bathing assist Assist Level: Contact Guard/Touching assist     Upper Body Dressing/Undressing Upper body dressing   What is the patient wearing?: Pull over shirt    Upper  body assist Assist Level: Minimal Assistance - Patient > 75%    Lower Body Dressing/Undressing Lower body dressing      What is the patient wearing?: Pants, Incontinence brief     Lower body assist Assist for lower body dressing: Moderate Assistance - Patient 50 - 74%     Toileting Toileting Toileting Activity did not occur Press photographer and hygiene only):  (2 assit stedy)  Toileting assist Assist for toileting: Maximal Assistance - Patient 25 - 49%     Transfers Chair/bed transfer  Transfers assist     Chair/bed transfer assist level: Minimal Assistance - Patient > 75%     Locomotion Ambulation   Ambulation assist   Ambulation activity did not occur: Safety/medical concerns  Assist level: Moderate Assistance - Patient 50 - 74% Assistive device: Orthosis (wall rail) Max distance: 69ft   Walk 10 feet activity   Assist  Walk 10 feet activity did not occur: Safety/medical concerns  Assist level: Moderate Assistance - Patient - 50 - 74% Assistive device: Other (comment), Orthosis (wall rail)   Walk 50 feet activity   Assist Walk 50 feet with 2 turns activity did not occur: Safety/medical concerns         Walk 150 feet activity   Assist Walk 150 feet activity did not occur: Safety/medical concerns         Walk 10 feet on uneven surface  activity   Assist Walk 10 feet on uneven surfaces activity did not occur: Safety/medical concerns  Wheelchair     Assist Will patient use wheelchair at discharge?: Yes Type of Wheelchair: Manual    Wheelchair assist level: Supervision/Verbal cueing Max wheelchair distance: >150 ft    Wheelchair 50 feet with 2 turns activity    Assist        Assist Level: Supervision/Verbal cueing   Wheelchair 150 feet activity     Assist      Assist Level: Supervision/Verbal cueing   Blood pressure (!) 148/102, pulse 90, temperature 97.9 F (36.6 C), resp. rate 18, height 6' 1.5"  (1.867 m), weight 103.2 kg, SpO2 98 %.    Medical Problem List and Plan: 1.  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  Antithrombotics: -DVT/anticoagulation: SCDs             -antiplatelet therapy: N/A 3. Pain Management: Tylenol as needed.   Appears to be controlled on 11/30 4. Mood: Provide emotional support             -antipsychotic agents: N/A 5. Neuropsych: This patient is not capable of making decisions on his own behalf. 6. Skin/Wound Care: Routine skin checks 7. Fluids/Electrolytes/Nutrition: Routine in and outs  BMP within acceptable range on 11/26  8.  Post stroke dysphagia.    D3 honey, advance as tolerated 9.  Hypertension.  Norvasc 10 mg daily, Normodyne 300 mg every 8 hours, lisinopril 20 mg twice daily.  Monitor with increased mobility  Hydralazine decreased to 25 3 times daily Vitals:   10/19/20 0536 10/19/20 0612  BP: (!) 147/106 (!) 148/102  Pulse: 92 90  Resp: 18   Temp: 97.9 F (36.6 C)   SpO2: 98%    Controlled 12/6 but elevated this am, likely pre am BP meds  10.  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  Provide counseling when appropriate 11.  Obesity.  BMI 30.67-->31.36-->31.02  Dietary follow-up  12. Incontinence of bowel and bladder  Trouble using call bell also is aphasic, cont efforts at timed toileting, pt now pointing to bathroom 13.  Right spastic hemiplegia  May need oral meds if this worsens  Wears WHO, wore PRAFO overnight 14.  Acute blood loss anemia  Hemoglobin 11.6 on 11/26  Continue to monitor  LOS: 29 days A FACE TO FACE EVALUATION WAS PERFORMED  Erick Colace 10/19/2020, 9:02 AM

## 2020-10-19 NOTE — Progress Notes (Signed)
Patient discharged off of unit with all belongings. Discharge papers/instructions explained by physician assistant to family. Patient and family have no further questions at time of discharge. No complications noted at this time.  Maegan Buller L Dovber Ernest  

## 2020-10-27 ENCOUNTER — Telehealth: Payer: Self-pay | Admitting: *Deleted

## 2020-10-27 ENCOUNTER — Encounter: Payer: Self-pay | Admitting: Registered Nurse

## 2020-10-27 NOTE — Telephone Encounter (Signed)
What kind of pain is he having? It is aching, throbbing vs pins and needles? Where precisely is the pain?. There were no reports of pain when he left rehab 8 days ago. thx

## 2020-10-27 NOTE — Telephone Encounter (Signed)
His wife says it just started last night and is aching and throbbing down near his ankle and in the arm affected by the stroke. She does say he hasn't had any pain until now, and doesn't know if he is getting some feeling back in his arm an leg?

## 2020-10-27 NOTE — Telephone Encounter (Signed)
Sounds like it might be some early neuropathic pain given that it's involving the right arm and leg (even though he doesn't characterize it as tingling or burning per se). If the pain is persistent and tylenol isn't helping, we could start low dose gabapentin 100mg  BID #60, 0RF until he's seen. Someone really needs to lay eyes on him to figure out more definitively what's going on.

## 2020-10-27 NOTE — Telephone Encounter (Signed)
Jason Romero called to report that Jason Romero is having more pain in his legs and arm and Tylenol is not helping.  He is asking if something can be called in for him to the pharmacy to help with this.  Dr Wynn Banker is out of the office until after Christmas and so I will send to Dr Riley Kill. Please advise.

## 2020-10-28 ENCOUNTER — Encounter: Payer: 59 | Attending: Registered Nurse | Admitting: Physical Medicine and Rehabilitation

## 2020-10-28 ENCOUNTER — Other Ambulatory Visit: Payer: Self-pay

## 2020-10-28 ENCOUNTER — Encounter: Payer: Self-pay | Admitting: Physical Medicine and Rehabilitation

## 2020-10-28 VITALS — BP 158/95 | HR 87 | Temp 98.5°F

## 2020-10-28 DIAGNOSIS — M792 Neuralgia and neuritis, unspecified: Secondary | ICD-10-CM | POA: Diagnosis not present

## 2020-10-28 DIAGNOSIS — I610 Nontraumatic intracerebral hemorrhage in hemisphere, subcortical: Secondary | ICD-10-CM | POA: Insufficient documentation

## 2020-10-28 MED ORDER — GABAPENTIN 100 MG PO CAPS: 100.0000 mg | ORAL_CAPSULE | Freq: Two times a day (BID) | ORAL | 0 refills | Status: DC

## 2020-10-28 MED ORDER — HYDRALAZINE HCL 50 MG PO TABS: 50.0000 mg | ORAL_TABLET | Freq: Three times a day (TID) | ORAL | 1 refills | Status: DC

## 2020-10-28 NOTE — Progress Notes (Signed)
Subjective:    Patient ID: Jason Romero, male    DOB: [birth year], 49 y.o.   MRN: [id number]  HPI  Jason Romero is a 49 year old man who presents for hospital follow-up after CIR admission.   Average pain is 8/10. Pain feels sharp, stabbing, and aching in his left leg, ankle, and toes. He is not currently taking any medications for this pain.  Bladder function has been normal.   Medications reviewed and he is taking them as prescribed.  BP is elevated in office to 158/95. He has been taking Labetalol, Amlodipine, and Hydralazine.   Pain Inventory Average Pain 8 Pain Right Now 8 My pain is sharp, stabbing and aching  LOCATION OF PAIN  Leg, ankle, toes  BOWEL Number of stools per week: 1 Oral laxative use No  Type of laxative na Enema or suppository use No  History of colostomy No  Incontinent No   BLADDER Normal In and out cath, frequency na Able to self cath na Bladder incontinence No  Frequent urination No  Leakage with coughing No  Difficulty starting stream No  Incomplete bladder emptying No    Mobility walk with assistance how many minutes can you walk? 5 ability to climb steps?  no do you drive?  no use a wheelchair needs help with transfers  Function not employed: date last employed . I need assistance with the following:  dressing, bathing and toileting  Neuro/Psych trouble walking  Prior Studies HFU  Physicians involved in your care HFU   Family History  Problem Relation Age of Onset  . Alcoholism Other   . Arthritis Other   . Breast cancer Other   . Heart disease Mother    Social History   Socioeconomic History  . Marital status: Married    Spouse name: Not on file  . Number of children: Not on file  . Years of education: Not on file  . Highest education level: Not on file  Occupational History  . Not on file  Tobacco Use  . Smoking status: Current Every Day Smoker    Packs/day: 1.00  . Smokeless tobacco: Never Used   Substance and Sexual Activity  . Alcohol use: Yes    Alcohol/week: 0.0 standard drinks  . Drug use: Yes    Types: Marijuana  . Sexual activity: Not Currently  Other Topics Concern  . Not on file  Social History Narrative  . Not on file   Social Determinants of Health   Financial Resource Strain: Not on file  Food Insecurity: Not on file  Transportation Needs: Not on file  Physical Activity: Not on file  Stress: Not on file  Social Connections: Not on file   Past Surgical History:  Procedure Laterality Date  . CHOLECYSTECTOMY     Past Medical History:  Diagnosis Date  . A-fib (HCC)   . Chronic headaches   . COPD (chronic obstructive pulmonary disease) (HCC)   . GERD (gastroesophageal reflux disease)   . History of blood transfusion   . Hypertension   . MI (myocardial infarction) (HCC)   . Seizures (HCC)    BP (!) 158/95   Pulse 87   Temp 98.5 F (36.9 C)   SpO2 97%   Opioid Risk Score:   Fall Risk Score:  `1  Depression screen PHQ 2/9  Depression screen [facility] 2/9 10/28/2020 03/22/2016  Decreased Interest 0 2  Down, Depressed, Hopeless 0 0  PHQ - 2 Score 0 2  Altered sleeping  2 1  Tired, decreased energy 1 2  Change in appetite 0 1  Feeling bad or failure about yourself  2 1  Trouble concentrating 2 0  Moving slowly or fidgety/restless 2 0  Suicidal thoughts 0 0  PHQ-9 Score 9 7  Difficult doing work/chores - Very difficult    Review of Systems     Objective:   Physical Exam  Gen: no distress, normal appearing HEENT: oral mucosa pink and moist, NCAT Cardio: Reg rate Chest: normal effort, normal rate of breathing Abd: soft, non-distended Ext: no edema Skin: intact Ex>rec aphasia , able to name simple objects Left side 5/5 in LUE and LLE Motor: RUE: 0/5 distal, 2- RIght shoulder adduction, trace biceps RLE: Hip flexion, knee extension 3-/5 distally 0/5 Psych: pleasant, normal affect    Assessment & Plan:  Jason Romero is a 49 year old man who  presents for hospital follow-up after CIR admission for ICH.   1) Left parietal ICH: -He has not yet started home therapy.  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 IF this does not help, please contact me and I will increase dose. Advised regarind side effects of drowsiness.   3) HTN: BP 158/95.   -Advised that BP is high and this can increase risk of ICH -Does not need refill at this time -He does not check BP at home, advised that he can buy from his pharamacy. -Continue Amlodipine. -Increase Hydralazine to 50mg  TID.  -Log BP every day and bring to follow-up appointment.

## 2020-10-28 NOTE — Telephone Encounter (Signed)
I have sent the med to the pharmacy and notified Mrs Reidel.  He has appt 11/18/19 with Riley Lam. I am checking to see if he can be seen earlier by Kirsteins or Raulkar.

## 2020-11-02 NOTE — Progress Notes (Signed)
Patient ID: Jason Romero, male   DOB: 1971/08/09, 49 y.o.   MRN: [id number]    Per Jason Romero, is a 48 y.o. male  GYF:[id number]  FFM:[id number]  DOB - 1971/09/07  Subjective:  Chief Complaint and HPI: Jason Romero is a 49 y.o. male here today to establish care and for a follow up visit After hospitalization 10/31-11/07/2020.  Then inpatient physical rehab 11/9-12//06/2020 and saw rehab in follow up 10/28/2020.   All summaries below.  He is able to walk a few feet with a walker.  Still having R hemiparesis of Leg and arm but seems to be improving a little daily.  Speech is improving some daily too.  His gf is here with him and is taking care of him.  He has stopped using "hard drugs."  He is still smoking marijuana daily to help with pain.  He also wants to increase his does of gabapentin for muscle spasms and anxiety bc 200mg  twice a day is not working.  He is in a wheelchair today.  He is awaiting home PT but is doing exercises at home daily.  Needs RF on meds.  No new issues or concerns.  Never drinks water.  Drinks Dr and juice.  Still smoking cigs.  Feels he is improving overall.     From hospital discharge summary: Left basal ganglia intracranial hemorrhage Cerebral edema with midline shift -Intra-parenchymal hemorrhage was likely secondary to uncontrolled hypertension -Initial few CT scans of head showed worsening of the size of hematoma with surrounding edema. Patient was monitored in ICU and also briefly given 3% normal saline. Last scan from 11/4 showed stable ICH with mild midline shift. -Echocardiogram with EF 50 to 50%, mild LVH, no source of embolus. -LDL 61, A1c 5.6 -UDS was positive for THC and amphetamine. -Prior to admission, patient was on aspirin 81 mg daily. Because of ICH, he is currently not on any antiplatelet or anticoagulant. -Plan is to follow-up with neurology in 4 weeks for repeat imaging. Once hemorrhage resolves, patient may be considered for  anticoagulation because of coexisting A. Fib -Current deficits: Dysphagia, aphasia, right hemiplegia, inconsistently follows motor command -PT recommended CIR.  Hypertensive emergency History of essential hypertension -Reported noncompliance to medications at home. -In the ICU, patient required Cleviprex drip. -Currently on oral blood pressure medicines including amlodipine 10 mg daily, labetalol 200 mg every 8 hours, lisinopril 20 mg daily, hydralazine 100 mg every 8 hours. -Short-term blood pressure goal systolic less than 160. Long-term blood pressure goal less than 140.  Hypernatremia -Apparently, sodium level has remained elevated throughout his stay. In the ICU, he was getting 3% normal saline. Currently off it.  -11/8, sodium level was 149. I started the patient on free water via core track 250 mL every 6 hours.  Sodium level slightly better at 148 today.  I would switch free water to 200 mL every 4 hours.  Sodium level needs to monitor while at CIR  From Inpatient rehab notes/d/c summary: Discharge Diagnoses:  Principal Problem:   ICH (intracerebral hemorrhage) (HCC) Active Problems:   Acute blood loss anemia   Spastic hemiplegia affecting nondominant side (HCC)   Dysphagia, post-stroke   Labile blood pressure History of tobacco abuse as well as marijuana Obesity Reportedly history of atrial fibrillation Remote seizure history  He/  has had improvement in activity tolerance, balance, postural control as well as ability to compensate for deficits. He/ has had improvement in functional use RUE/LUE  and RLE/LLE as well as improvement  in awareness.  Supine to sit moderate assist for right lower extremity upper extremity management minimal assist scooting to edge of bed.  Patient donned shoes AFO socks and pants around knees in sitting max assist.  Sit to stand minimal assist and patient able to complete 80% of lower body dressing.  Stand pivot transfers +2 with rolling walker.   Ambulated 35 feet rolling walker minimal assist.  Completed bed mobility for ADLs to come to sitting edge of bed with minimal assist and cues.  Patient able to thread right lower extremity into pants with assistance to achieve maintain figure 4 position.  Completed sit to stand edge of bed with rolling walker to pull up pants.  Patient able to calm up to standing with moderate assist and unable to pull up pants over hips with increased time in moderate assist for standing balance.  Wife educated on patient's current expressive and receptive language skills.  Wife provided with handout on strategies for patient and aphasia worksheets.  Full family teaching completed plan discharge to home  Medications at discharge 1.  Tylenol as needed 2.  Norvasc 10 mg daily 3.  Hydralazine 25 mg every 8 hours 4.  Normodyne 300 mg every 8 hours 5.  Lisinopril 20 mg twice daily 6.  Protonix 40 mg daily 7.  Senokot 1 tablet twice daily hold for loose stools  Rehab outpatient visit: Jason Romero is a 49 year old man who presents for hospital follow-up after CIR admission for ICH.   1) Left parietal ICH: -He has not yet started home therapy.  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 IF this does not help, please contact me and I will increase dose. Advised regarind side effects of drowsiness.   3) HTN: BP 158/95.   -Advised that BP is high and this can increase risk of ICH -Does not need refill at this time -He does not check BP at home, advised that he can buy from his pharamacy. -Continue Amlodipine. -Increase Hydralazine to 50mg  TID.  -Log BP every day and bring to follow-up appointment.  ED/Hospital notes reviewed.    ROS:   Constitutional:  No f/c, No night sweats, No unexplained weight loss. EENT:  No vision changes, No blurry vision, No hearing changes. No mouth, throat, or ear problems.  Respiratory: No cough, No  SOB Cardiac: No CP, no palpitations GI:  No abd pain, No N/V/D. GU: No Urinary s/sx Musculoskeletal: No joint pain Neuro: No headache, no dizziness  Skin: No rash Endocrine:  No polydipsia. No polyuria.  Psych: Denies SI/HI  No problems updated.  ALLERGIES: Allergies  Allergen Reactions   Hydrocodone Hives   Ibuprofen Other (See Comments)    Pt can't take this medication because it interacts with other medications that he is taking.     PAST MEDICAL HISTORY: Past Medical History:  Diagnosis Date   A-fib [facility](HCC)    Chronic headaches    COPD (chronic obstructive pulmonary disease) (HCC)    GERD (gastroesophageal reflux disease)    History of blood transfusion    Hypertension    MI (myocardial infarction) (HCC)    Seizures (HCC)     MEDICATIONS AT HOME: Prior to Admission medications   Medication Sig Start Date End Date Taking? Authorizing Provider  gabapentin (NEURONTIN) 300 MG capsule Take 1 capsule (300 mg total) by mouth 3 (three) times daily. 11/09/20  Yes Anders SimmondsMcClung, Geo Slone M, PA-C  acetaminophen (TYLENOL) 325  MG tablet Take 2 tablets (650 mg total) by mouth every 4 (four) hours as needed for mild pain (or temp > 37.5 C (99.5 F)). 10/18/20   Angiulli, Mcarthur Rossetti, PA-C  amLODipine (NORVASC) 10 MG tablet Take 1 tablet (10 mg total) by mouth daily. 11/09/20   Anders Simmonds, PA-C  hydrALAZINE (APRESOLINE) 50 MG tablet Take 1 tablet (50 mg total) by mouth 3 (three) times daily. 11/09/20   Anders Simmonds, PA-C  labetalol (NORMODYNE) 300 MG tablet Take 1 tablet (300 mg total) by mouth every 8 (eight) hours. 11/09/20   Anders Simmonds, PA-C  lisinopril (ZESTRIL) 20 MG tablet Take 1 tablet (20 mg total) by mouth 2 (two) times daily. 11/09/20   Anders Simmonds, PA-C  pantoprazole (PROTONIX) 40 MG tablet Take 1 tablet (40 mg total) by mouth daily. 11/09/20   Anders Simmonds, PA-C  senna (SENOKOT) 8.6 MG TABS tablet Take 1 tablet (8.6 mg total) by mouth 2 (two) times  daily. Patient not taking: Reported on 10/28/2020 10/18/20   Charlton Amor, PA-C     Objective:  EXAM:   Vitals:   11/09/20 0938  BP: (!) 136/92  Pulse: 80  Resp: 16  Temp: 98 F (36.7 C)  SpO2: 98%    General appearance : A&OX3. NAD. Non-toxic-appearing HEENT: Atraumatic and Normocephalic.  PERRLA. EOM intact.  TM clear B. Mouth-MMM, post pharynx WNL w/o erythema, No PND. Neck: supple, no JVD. No cervical lymphadenopathy. No thyromegaly Chest/Lungs:  Breathing-non-labored, Good air entry bilaterally, breath sounds normal without rales, rhonchi, or wheezing  CVS: S1 S2 regular, no murmurs, gallops, rubs  Abdomen: Bowel sounds present, Non tender and not distended with no gaurding, rigidity or rebound. Extremities: Bilateral Lower Ext shows no edema, both legs are warm to touch with = pulse throughout Neurology:  CN II-XII grossly intact, Non focal.   Psych:  TP linear. J/I WNL. Normal speech. Appropriate eye contact and affect.  Skin:  No Rash  Data Review Lab Results  Component Value Date   HGBA1C 5.6 09/12/2020   HGBA1C 5.7 02/07/2016     Assessment & Plan   1. Essential hypertension Improving control - amLODipine (NORVASC) 10 MG tablet; Take 1 tablet (10 mg total) by mouth daily.  Dispense: 30 tablet; Refill: 3 - labetalol (NORMODYNE) 300 MG tablet; Take 1 tablet (300 mg total) by mouth every 8 (eight) hours.  Dispense: 90 tablet; Refill: 3 - lisinopril (ZESTRIL) 20 MG tablet; Take 1 tablet (20 mg total) by mouth 2 (two) times daily.  Dispense: 60 tablet; Refill: 3 - hydrALAZINE (APRESOLINE) 50 MG tablet; Take 1 tablet (50 mg total) by mouth 3 (three) times daily.  Dispense: 90 tablet; Refill: 2 - Comprehensive metabolic panel - CBC with Differential/Platelet  2. Tobacco abuse Smoking and dangers of nicotine have been discussed at length. Long term health consequences of smoking reviewed in detail.  Methods for helping with cessation have been reviewed.   Patient expresses understanding.   3. Stroke, hemorrhagic (HCC) - amLODipine (NORVASC) 10 MG tablet; Take 1 tablet (10 mg total) by mouth daily.  Dispense: 30 tablet; Refill: 3 - labetalol (NORMODYNE) 300 MG tablet; Take 1 tablet (300 mg total) by mouth every 8 (eight) hours.  Dispense: 90 tablet; Refill: 3 - lisinopril (ZESTRIL) 20 MG tablet; Take 1 tablet (20 mg total) by mouth 2 (two) times daily.  Dispense: 60 tablet; Refill: 3 - hydrALAZINE (APRESOLINE) 50 MG tablet; Take 1 tablet (50 mg total) by  mouth 3 (three) times daily.  Dispense: 90 tablet; Refill: 2 - Comprehensive metabolic panel  4. Acute blood loss anemia - CBC with Differential/Platelet  5. Anxiety - gabapentin (NEURONTIN) 300 MG capsule; Take 1 capsule (300 mg total) by mouth 3 (three) times daily.  Dispense: 90 capsule; Refill: 3  6. Muscle spasm - gabapentin (NEURONTIN) 300 MG capsule; Take 1 capsule (300 mg total) by mouth 3 (three) times daily.  Dispense: 90 capsule; Refill: 3 - Comprehensive metabolic panel  7. Substance use I have counseled the patient at length about substance abuse and addiction.  12 step meetings/recovery recommended.  Local 12 step meeting lists were given and attendance was encouraged.  Patient expresses understanding.    8. Hospital discharge follow-up Improving.  Next appt with outpatient rehab 11/2020.      Patient have been counseled extensively about nutrition and exercise  Return in about 2 months (around 01/09/2021) for assign PCP.  The patient was given clear instructions to go to ER or return to medical center if symptoms don't improve, worsen or new problems develop. The patient verbalized understanding. The patient was told to call to get lab results if they haven't heard anything in the next week.     Georgian Co, PA-C [facility] and [facility] [place], [place] [phone]   11/09/2020, 10:01 AM

## 2020-11-09 ENCOUNTER — Other Ambulatory Visit: Payer: Self-pay | Admitting: Physician Assistant

## 2020-11-09 ENCOUNTER — Encounter: Payer: Self-pay | Admitting: Physician Assistant

## 2020-11-09 ENCOUNTER — Other Ambulatory Visit: Payer: Self-pay

## 2020-11-09 ENCOUNTER — Ambulatory Visit: Payer: 59 | Attending: Physician Assistant | Admitting: Physician Assistant

## 2020-11-09 VITALS — BP 136/92 | HR 80 | Temp 98.0°F | Resp 16

## 2020-11-09 DIAGNOSIS — Z720 Tobacco use: Secondary | ICD-10-CM

## 2020-11-09 DIAGNOSIS — I619 Nontraumatic intracerebral hemorrhage, unspecified: Secondary | ICD-10-CM

## 2020-11-09 DIAGNOSIS — F1990 Other psychoactive substance use, unspecified, uncomplicated: Secondary | ICD-10-CM

## 2020-11-09 DIAGNOSIS — F419 Anxiety disorder, unspecified: Secondary | ICD-10-CM

## 2020-11-09 DIAGNOSIS — D62 Acute posthemorrhagic anemia: Secondary | ICD-10-CM

## 2020-11-09 DIAGNOSIS — I10 Essential (primary) hypertension: Secondary | ICD-10-CM | POA: Diagnosis not present

## 2020-11-09 DIAGNOSIS — Z09 Encounter for follow-up examination after completed treatment for conditions other than malignant neoplasm: Secondary | ICD-10-CM

## 2020-11-09 DIAGNOSIS — M62838 Other muscle spasm: Secondary | ICD-10-CM

## 2020-11-09 MED ORDER — LISINOPRIL 20 MG PO TABS: 20.0000 mg | ORAL_TABLET | Freq: Two times a day (BID) | ORAL | 3 refills | Status: DC

## 2020-11-09 MED ORDER — GABAPENTIN 300 MG PO CAPS: 300.0000 mg | ORAL_CAPSULE | Freq: Three times a day (TID) | ORAL | 3 refills | Status: DC

## 2020-11-09 MED ORDER — LABETALOL HCL 300 MG PO TABS: 300.0000 mg | ORAL_TABLET | Freq: Three times a day (TID) | ORAL | 3 refills | Status: DC

## 2020-11-09 MED ORDER — PANTOPRAZOLE SODIUM 40 MG PO TBEC: 40.0000 mg | DELAYED_RELEASE_TABLET | Freq: Every day | ORAL | 2 refills | Status: DC

## 2020-11-09 MED ORDER — HYDRALAZINE HCL 50 MG PO TABS: 50.0000 mg | ORAL_TABLET | Freq: Three times a day (TID) | ORAL | 2 refills | Status: DC

## 2020-11-09 MED ORDER — AMLODIPINE BESYLATE 10 MG PO TABS: 10.0000 mg | ORAL_TABLET | Freq: Every day | ORAL | 3 refills | Status: DC

## 2020-11-09 MED FILL — GABAPENTIN 300 MG CAPSULE: 300 | 30 days supply | Qty: 90 | Fill #0

## 2020-11-09 NOTE — Patient Instructions (Signed)
Charlotta Newton.org is the website for narcotics anonymous NoInsuranceAgent.es is the AA website   Smoking Tobacco Information, Adult Smoking tobacco can be harmful to your health. Tobacco contains a poisonous (toxic), colorless chemical called nicotine. Nicotine is addictive. It changes the brain and can make it hard to stop smoking. Tobacco also has other toxic chemicals that can hurt your body and raise your risk of many cancers. How can smoking tobacco affect me? Smoking tobacco puts you at risk for:  Cancer. Smoking is most commonly associated with lung cancer, but can also lead to cancer in other parts of the body.  Chronic obstructive pulmonary disease (COPD). This is a long-term lung condition that makes it hard to breathe. It also gets worse over time.  High blood pressure (hypertension), heart disease, stroke, or heart attack.  Lung infections, such as pneumonia.  Cataracts. This is when the lenses in the eyes become clouded.  Digestive problems. This may include peptic ulcers, heartburn, and gastroesophageal reflux disease (GERD).  Oral health problems, such as gum disease and tooth loss.  Loss of taste and smell. Smoking can affect your appearance by causing:  Wrinkles.  Yellow or stained teeth, fingers, and fingernails. Smoking tobacco can also affect your social life, because:  It may be challenging to find places to smoke when away from home. Many workplaces, [employer], hotels, and public places are tobacco-free.  Smoking is expensive. This is due to the cost of tobacco and the long-term costs of treating health problems from smoking.  Secondhand smoke may affect those around you. Secondhand smoke can cause lung cancer, breathing problems, and heart disease. Children of smokers have a higher risk for: ? Sudden infant death syndrome (SIDS). ? Ear infections. ? Lung infections. If you currently smoke tobacco, quitting now can help you:  Lead a longer and healthier  life.  Look, smell, breathe, and feel better over time.  Save money.  Protect others from the harms of secondhand smoke. What actions can I take to prevent health problems? Quit smoking   Do not start smoking. Quit if you already do.  Make a plan to quit smoking and commit to it. Look for programs to help you and ask your health care provider for recommendations and ideas.  Set a date and write down all the reasons you want to quit.  Let your friends and family know you are quitting so they can help and support you. Consider finding friends who also want to quit. It can be easier to quit with someone else, so that you can support each other.  Talk with your health care provider about using nicotine replacement medicines to help you quit, such as gum, lozenges, patches, sprays, or pills.  Do not replace cigarette smoking with electronic cigarettes, which are commonly called e-cigarettes. The safety of e-cigarettes is not known, and some may contain harmful chemicals.  If you try to quit but return to smoking, stay positive. It is common to slip up when you first quit, so take it one day at a time.  Be prepared for cravings. When you feel the urge to smoke, chew gum or suck on hard candy. Lifestyle  Stay busy and take care of your body.  Drink enough fluid to keep your urine pale yellow.  Get plenty of exercise and eat a healthy diet. This can help prevent weight gain after quitting.  Monitor your eating habits. Quitting smoking can cause you to have a larger appetite than when you smoke.  Find ways  to relax. Go out with friends or family to a movie or a restaurant where people do not smoke.  Ask your health care provider about having regular tests (screenings) to check for cancer. This may include blood tests, imaging tests, and other tests.  Find ways to manage your stress, such as meditation, yoga, or exercise. Where to find support To get support to quit smoking,  consider:  Asking your health care provider for more information and resources.  Taking classes to learn more about quitting smoking.  Looking for local organizations that offer resources about quitting smoking.  Joining a support group for people who want to quit smoking in your local community.  Calling the smokefree.gov counselor helpline: 1-800-Quit-Now 925-727-5594) Where to find more information You may find more information about quitting smoking from:  HelpGuide.org: www.helpguide.org  BankRights.uy: smokefree.gov  American Lung Association: www.lung.org Contact a health care provider if you:  Have problems breathing.  Notice that your lips, nose, or fingers turn blue.  Have chest pain.  Are coughing up blood.  Feel faint or you pass out.  Have other health changes that cause you to worry. Summary  Smoking tobacco can negatively affect your health, the health of those around you, your finances, and your social life.  Do not start smoking. Quit if you already do. If you need help quitting, ask your health care provider.  Think about joining a support group for people who want to quit smoking in your local community. There are many effective programs that will help you to quit this behavior. This information is not intended to replace advice given to you by your health care provider. Make sure you discuss any questions you have with your health care provider. Document Revised: 07/24/2019 Document Reviewed: 11/13/2016 Elsevier Patient Education  2020 [employer].

## 2020-11-10 LAB — COMPREHENSIVE METABOLIC PANEL
ALT: 17 IU/L (ref 0–44)
AST: 14 IU/L (ref 0–40)
Albumin/Globulin Ratio: 1.6 (ref 1.2–2.2)
Albumin: 4.2 g/dL (ref 3.8–4.9)
Alkaline Phosphatase: 81 IU/L (ref 44–121)
BUN/Creatinine Ratio: 10 (ref 9–20)
BUN: 9 mg/dL (ref 6–24)
Bilirubin Total: 0.3 mg/dL (ref 0.0–1.2)
CO2: 21 mmol/L (ref 20–29)
Calcium: 9.5 mg/dL (ref 8.7–10.2)
Chloride: 104 mmol/L (ref 96–106)
Creatinine, Ser: 0.93 mg/dL (ref 0.76–1.27)
GFR calc Af Amer: 109 mL/min/{1.73_m2} (ref >59)
GFR calc non Af Amer: 95 mL/min/{1.73_m2} (ref >59)
Globulin, Total: 2.7 g/dL (ref 1.5–4.5)
Glucose: 89 mg/dL (ref 65–99)
Potassium: 4.1 mmol/L (ref 3.5–5.2)
Sodium: 140 mmol/L (ref 134–144)
Total Protein: 6.9 g/dL (ref 6.0–8.5)

## 2020-11-10 LAB — CBC WITH DIFFERENTIAL/PLATELET
Basophils Absolute: 0.1 10*3/uL (ref 0.0–0.2)
Basos: 1 %
EOS (ABSOLUTE): 0.2 10*3/uL (ref 0.0–0.4)
Eos: 2 %
Hematocrit: 35.8 % — ABNORMAL LOW (ref 37.5–51.0)
Hemoglobin: 11.9 g/dL — ABNORMAL LOW (ref 13.0–17.7)
Immature Grans (Abs): 0.1 10*3/uL (ref 0.0–0.1)
Immature Granulocytes: 1 %
Lymphocytes Absolute: 1.7 10*3/uL (ref 0.7–3.1)
Lymphs: 20 %
MCH: 27.9 pg (ref 26.6–33.0)
MCHC: 33.2 g/dL (ref 31.5–35.7)
MCV: 84 fL (ref 79–97)
Monocytes Absolute: 0.9 10*3/uL (ref 0.1–0.9)
Monocytes: 11 %
Neutrophils Absolute: 5.5 10*3/uL (ref 1.4–7.0)
Neutrophils: 65 %
Platelets: 391 10*3/uL (ref 150–450)
RBC: 4.26 x10E6/uL (ref 4.14–5.80)
RDW: 14.7 % (ref 11.6–15.4)
WBC: 8.4 10*3/uL (ref 3.4–10.8)

## 2020-11-15 MED FILL — AMLODIPINE BESYLATE 10 MG T: 10 | 30 days supply | Qty: 30 | Fill #0

## 2020-11-15 MED FILL — LISINOPRIL 20 MG TABLET: 20 | 30 days supply | Qty: 60 | Fill #0

## 2020-11-15 MED FILL — PANTOPRAZOLE SOD DR 40 MG T: 40 | 30 days supply | Qty: 30 | Fill #0

## 2020-11-15 MED FILL — hydrALAZINE HCL 50 MG TABS: 50 | 30 days supply | Qty: 90 | Fill #0

## 2020-11-15 MED FILL — LABETALOL HCL 300 MG TABLET: 300 | 30 days supply | Qty: 90 | Fill #0

## 2020-11-16 ENCOUNTER — Encounter: Payer: Self-pay | Admitting: *Deleted

## 2020-11-17 ENCOUNTER — Inpatient Hospital Stay: Payer: Self-pay | Admitting: Registered Nurse

## 2020-12-09 ENCOUNTER — Encounter: Payer: 59 | Attending: Registered Nurse | Admitting: Physical Medicine and Rehabilitation

## 2020-12-09 DIAGNOSIS — M792 Neuralgia and neuritis, unspecified: Secondary | ICD-10-CM | POA: Insufficient documentation

## 2020-12-09 DIAGNOSIS — I610 Nontraumatic intracerebral hemorrhage in hemisphere, subcortical: Secondary | ICD-10-CM | POA: Insufficient documentation

## 2020-12-13 MED FILL — AMLODIPINE BESYLATE 10 MG T: 10 | 30 days supply | Qty: 30 | Fill #1

## 2020-12-13 MED FILL — LABETALOL HCL 300 MG TABLET: 300 | 30 days supply | Qty: 90 | Fill #1

## 2020-12-28 MED FILL — GABAPENTIN 300 MG CAPSULE: 300 | 30 days supply | Qty: 90 | Fill #1

## 2020-12-28 MED FILL — LISINOPRIL 20 MG TABLET: 20 | 30 days supply | Qty: 60 | Fill #1

## 2020-12-29 ENCOUNTER — Ambulatory Visit: Payer: 59 | Admitting: Critical Care Medicine

## 2021-01-09 MED FILL — AMLODIPINE BESYLATE 10 MG T: 10 | 30 days supply | Qty: 30 | Fill #2

## 2021-01-09 MED FILL — hydrALAZINE HCL 50 MG TABS: 50 | 30 days supply | Qty: 90 | Fill #1

## 2021-01-13 MED FILL — LABETALOL HCL 300 MG TABLET: 300 | 30 days supply | Qty: 90 | Fill #2

## 2021-01-24 ENCOUNTER — Other Ambulatory Visit: Payer: Self-pay

## 2021-01-24 ENCOUNTER — Other Ambulatory Visit: Payer: Self-pay | Admitting: Physician Assistant

## 2021-01-24 ENCOUNTER — Ambulatory Visit: Payer: 59 | Admitting: Physician Assistant

## 2021-01-24 VITALS — BP 132/86 | HR 61 | Temp 98.5°F | Resp 18 | Ht 71.0 in

## 2021-01-24 DIAGNOSIS — F3289 Other specified depressive episodes: Secondary | ICD-10-CM | POA: Diagnosis not present

## 2021-01-24 DIAGNOSIS — M25522 Pain in left elbow: Secondary | ICD-10-CM

## 2021-01-24 DIAGNOSIS — G8110 Spastic hemiplegia affecting unspecified side: Secondary | ICD-10-CM | POA: Diagnosis not present

## 2021-01-24 DIAGNOSIS — I10 Essential (primary) hypertension: Secondary | ICD-10-CM | POA: Diagnosis not present

## 2021-01-24 DIAGNOSIS — I69391 Dysphagia following cerebral infarction: Secondary | ICD-10-CM | POA: Diagnosis not present

## 2021-01-24 DIAGNOSIS — I619 Nontraumatic intracerebral hemorrhage, unspecified: Secondary | ICD-10-CM | POA: Diagnosis not present

## 2021-01-24 MED ORDER — GABAPENTIN 300 MG PO CAPS: ORAL_CAPSULE | ORAL | 3 refills | Status: DC

## 2021-01-24 MED ORDER — CITALOPRAM HYDROBROMIDE 20 MG PO TABS: 20.0000 mg | ORAL_TABLET | Freq: Every day | ORAL | 3 refills | Status: DC

## 2021-01-24 NOTE — Progress Notes (Signed)
Patient has taken medication today and patient has had coffee with cream and sugar. Patient denies pain, HA, dizziness or nausea at this time. Patient shares a concern regarding a lump on the right elbow that has been present for years with no pain only intermittently filling with fluid.

## 2021-01-24 NOTE — Patient Instructions (Signed)
You will increase your dosing of gabapentin to 300 mg in the morning, afternoon, and then 600 mg at bedtime.  You will start Celexa 20 mg once daily to help with your depression and anger.  Make sure to keep your follow-up appointment with Dr. [name].  Make sure to keep working on obtaining Medicaid.  Please let us know if there is anything else we can do for you  Roney Jaffe, PA-C Physician Assistant [facility] [email or web address]    Emotional Health After Stroke Your emotional health may change after a stroke. You may have fear, anxiety, anger, sadness, and other feelings. Some of these changes happen because a stroke can damage your brain and nervous system. You may also have these feelings because coping with a change in your health can feel overwhelming. Depression and other emotional changes can slow your recovery after a stroke. It is important to recognize the symptoms so that you can take steps to strengthen your emotional health. What are some common emotions after a stroke? You may have:  Fear.  Anxiety.  Anger.  Frustration.  Sadness.  Feelings of loss or grief.  Depression.  Crying or laughing at the wrong time or wrong situation (pseudobulbar affect, orPBA). What are the symptoms of depression? Depression after a stroke can happen right away, or it can show up later. Symptoms of depression may include:  Sleep problems.  Changes in normal eating habits, such as eating too much or too little.  Weight gain or weight loss.  Not having energy or enthusiasm (lethargy).  Feeling very tired (fatigue).  Avoiding people and activities (social withdrawal).  Irritability.  Crying more than usual.  Mood swings.  Not being able to concentrate.  Feeling hopeless.  Hating yourself.  Having suicidal thoughts. If you ever feel like you may hurt yourself or others, or have thoughts about taking  your own life, get help right away. You can go to your nearest emergency department or call:  Your local emergency services (911 in the [place]  A suicide crisis helpline, such as the National Suicide Prevention Lifeline at 602 621 8756. This is open 24 hours a day. What increases my risk of depression? Having a stroke raises the risk of depression. The risk also goes up if you:  Are socially isolated.  Have a family history of depression.  Have a history of depression or other mental health problems before the stroke.  Are unable to work or do activities that you previously enjoyed.  Need help from others for daily activities.  Use drugs or drink alcohol.  Take certain medicines, such as sleeping pills or high blood pressure medicines. What are some coping methods I can use? Ask your health care provider for help. Your health care provider may recommend treatments for depression, such as:  Talk therapy or counseling with a mental health professional. This may include cognitive behavioral therapy (CBT) to help change your patterns of thinking.  Medical therapies, such as brain stimulation or light therapy.  Lifestyle changes, such as eating a healthy diet and avoiding alcohol.  Antidepressant medicines.  Alternative therapies, such as acupuncture, music therapy, or pet therapy. Other coping strategies you may try include:  Physical therapy or exercises. Try to do some exercise every day.  Writing your thoughts in a journal. An example might be keeping track of unrealistic thoughts or keeping a log of things you are thankful for.  Practicing good sleep habits, such as getting up at the same time every  day.  Following a predictable routine each day.  Participating in activities that make you laugh.  Mindfulness therapy. This may include meditation and other techniques to lower your stress.  Joining a support group for people who are recovering from a stroke. These groups  provide social interaction and help you feel connected to others. Your health care team can help you find a support group in your area.   Summary  Your emotional health may change after a stroke. You may have fear, anxiety, anger, sadness, and other feelings.  It is important to recognize the symptoms of depression and other emotional problems.  If you experience emotional changes, let your loved ones know and contact your health care provider. This information is not intended to replace advice given to you by your health care provider. Make sure you discuss any questions you have with your health care provider. Document Revised: 10/11/2017 Document Reviewed: 02/01/2017 Elsevier Patient Education  2021 [employer].

## 2021-01-24 NOTE — Progress Notes (Signed)
Established Patient Office Visit  Subjective:  Patient ID: Jason Romero, male    DOB: 10-30-1971  Age: 50 y.o. MRN: [id number]  CC:  Chief Complaint  Patient presents with  . Follow-up    BP+Stroke    HPI Jason Romero reports that he needs a form filled out on his behalf stating his current diagnosis.  Also complains of increased pain in his left elbow and difficulty getting appointments   States that he has had previous surgery on his left elbow, states that he has "screws and rods" in there.  States that the gabapentin offers some relief, but does make him sleepy.   States that he has been having increased  Pain and swelling in his left hand.  States that he is using his brace while sleeping and keeps it elevated during the day.   Wife is present and helps with history due to dysphagia.  States that they have been frustrated with appointments being cancelled and they are working on getting his disability and medicaid approved.  States that he has not had any PT or OT at home due to financial constraints.     Past Medical History:  Diagnosis Date  . A-fib (HCC)   . Chronic headaches   . COPD (chronic obstructive pulmonary disease) (HCC)   . GERD (gastroesophageal reflux disease)   . History of blood transfusion   . Hypertension   . MI (myocardial infarction) (HCC)   . Seizures (HCC)     Past Surgical History:  Procedure Laterality Date  . CHOLECYSTECTOMY      Family History  Problem Relation Age of Onset  . Alcoholism Other   . Arthritis Other   . Breast cancer Other   . Heart disease Mother     Social History   Socioeconomic History  . Marital status: Married    Spouse name: Not on file  . Number of children: Not on file  . Years of education: Not on file  . Highest education level: Not on file  Occupational History  . Not on file  Tobacco Use  . Smoking status: Current Every Day Smoker    Packs/day: 1.00  . Smokeless tobacco: Never Used   Substance and Sexual Activity  . Alcohol use: Yes    Alcohol/week: 0.0 standard drinks  . Drug use: Yes    Types: Marijuana  . Sexual activity: Not Currently  Other Topics Concern  . Not on file  Social History Narrative  . Not on file   Social Determinants of Health   Financial Resource Strain: Not on file  Food Insecurity: Not on file  Transportation Needs: Not on file  Physical Activity: Not on file  Stress: Not on file  Social Connections: Not on file  Intimate Partner Violence: Not on file    Outpatient Medications Prior to Visit  Medication Sig Dispense Refill  . acetaminophen (TYLENOL) 325 MG tablet Take 2 tablets (650 mg total) by mouth every 4 (four) hours as needed for mild pain (or temp > 37.5 C (99.5 F)).    Marland Kitchen amLODipine (NORVASC) 10 MG tablet Take 1 tablet (10 mg total) by mouth daily. 30 tablet 3  . hydrALAZINE (APRESOLINE) 50 MG tablet Take 1 tablet (50 mg total) by mouth 3 (three) times daily. 90 tablet 2  . labetalol (NORMODYNE) 300 MG tablet Take 1 tablet (300 mg total) by mouth every 8 (eight) hours. 90 tablet 3  . lisinopril (ZESTRIL) 20 MG tablet Take 1  tablet (20 mg total) by mouth 2 (two) times daily. 60 tablet 3  . pantoprazole (PROTONIX) 40 MG tablet Take 1 tablet (40 mg total) by mouth daily. 30 tablet 2  . senna (SENOKOT) 8.6 MG TABS tablet Take 1 tablet (8.6 mg total) by mouth 2 (two) times daily. (Patient not taking: Reported on 10/28/2020) 120 tablet 0  . gabapentin (NEURONTIN) 300 MG capsule Take 1 capsule (300 mg total) by mouth 3 (three) times daily. 90 capsule 3  . hydrALAZINE (APRESOLINE) 50 MG tablet Take by mouth.    . hydrochlorothiazide 10 mg/mL SUSP Take by mouth.     No facility-administered medications prior to visit.    Allergies  Allergen Reactions  . Hydrocodone Hives  . Ibuprofen Other (See Comments)    Pt can't take this medication because it interacts with other medications that he is taking.     ROS Review of Systems   Constitutional: Negative for chills and fever.  HENT: Negative.   Eyes: Negative.   Respiratory: Negative.   Cardiovascular: Negative.   Gastrointestinal: Negative.   Endocrine: Negative.   Genitourinary: Negative.   Musculoskeletal: Positive for arthralgias and myalgias.  Skin: Negative.   Allergic/Immunologic: Negative.   Neurological: Positive for speech difficulty and weakness. Negative for headaches.  Psychiatric/Behavioral: Positive for agitation and dysphoric mood. Negative for self-injury, sleep disturbance and suicidal ideas.      Objective:    Physical Exam Vitals and nursing note reviewed.  Constitutional:      Appearance: Normal appearance.     Comments: In wheelchair  HENT:     Head: Normocephalic and atraumatic.     Right Ear: External ear normal.     Left Ear: External ear normal.     Nose: Nose normal.     Mouth/Throat:     Mouth: Mucous membranes are moist.     Pharynx: Oropharynx is clear.  Eyes:     Extraocular Movements: Extraocular movements intact.     Conjunctiva/sclera: Conjunctivae normal.     Pupils: Pupils are equal, round, and reactive to light.  Cardiovascular:     Rate and Rhythm: Normal rate and regular rhythm.     Pulses: Normal pulses.     Heart sounds: Normal heart sounds.  Pulmonary:     Effort: Pulmonary effort is normal.     Breath sounds: Normal breath sounds.  Musculoskeletal:     Right elbow: Decreased range of motion.     Left elbow: No swelling.     Right hand: Swelling present. Decreased range of motion. Decreased strength. Decreased sensation.     Left hand: Normal.       Arms:     Cervical back: Normal range of motion and neck supple.  Skin:    General: Skin is warm and dry.  Neurological:     General: No focal deficit present.     Mental Status: He is alert and oriented to person, place, and time.  Psychiatric:        Mood and Affect: Mood normal.        Behavior: Behavior normal.        Thought Content: Thought  content normal. Thought content does not include homicidal or suicidal ideation.        Judgment: Judgment normal.     BP 132/86 (BP Location: Left Arm, Patient Position: Sitting, Cuff Size: Normal)   Pulse 61   Temp 98.5 F (36.9 C) (Oral)   Resp 18   Ht 5\' 11"  (  1.803 m)   SpO2 98%   BMI 31.74 kg/m  Wt Readings from Last 3 Encounters:  10/19/20 227 lb 9.6 oz (103.2 kg)  09/20/20 240 lb 15.4 oz (109.3 kg)  03/11/17 228 lb (103.4 kg)     Health Maintenance Due  Topic Date Due  . Hepatitis C Screening  Never done  . COLONOSCOPY (Pts 45-33yrs Insurance coverage will need to be confirmed)  Never done    There are no preventive care reminders to display for this patient.  Lab Results  Component Value Date   TSH 0.64 03/22/2016   Lab Results  Component Value Date   WBC 8.4 11/09/2020   HGB 11.9 (L) 11/09/2020   HCT 35.8 (L) 11/09/2020   MCV 84 11/09/2020   PLT 391 11/09/2020   Lab Results  Component Value Date   NA 140 11/09/2020   K 4.1 11/09/2020   CO2 21 11/09/2020   GLUCOSE 89 11/09/2020   BUN 9 11/09/2020   CREATININE 0.93 11/09/2020   BILITOT 0.3 11/09/2020   ALKPHOS 81 11/09/2020   AST 14 11/09/2020   ALT 17 11/09/2020   PROT 6.9 11/09/2020   ALBUMIN 4.2 11/09/2020   CALCIUM 9.5 11/09/2020   ANIONGAP 13 10/07/2020   GFR 70.92 02/27/2017   Lab Results  Component Value Date   CHOL 122 09/12/2020   Lab Results  Component Value Date   HDL 40 (L) 09/12/2020   Lab Results  Component Value Date   LDLCALC 61 09/12/2020   Lab Results  Component Value Date   TRIG 104 09/12/2020   TRIG 114 09/12/2020   Lab Results  Component Value Date   CHOLHDL 3.1 09/12/2020   Lab Results  Component Value Date   HGBA1C 5.6 09/12/2020      Assessment & Plan:   Problem List Items Addressed This Visit      Cardiovascular and Mediastinum   Essential hypertension     Digestive   Dysphagia, post-stroke     Nervous and Auditory   Stroke, hemorrhagic  (HCC)   Spastic hemiplegia affecting nondominant side (HCC) - Primary   Relevant Medications   gabapentin (NEURONTIN) 300 MG capsule    Other Visit Diagnoses    Other depression       Relevant Medications   citalopram (CELEXA) 20 MG tablet   Left elbow pain        1. Spastic hemiplegia affecting nondominant side (HCC) Increase gabapentin dosing, bedtime dosing to 600, keeping 1 unit afternoon dosing same.  [name] on February 13, 2021.  Paperwork filled out on patient's behalf.  Patient encouraged to continue follow-up with disability and Medicaid. - gabapentin (NEURONTIN) 300 MG capsule; Take 300 mg at 8 am and 4 pm and 600 mg at bedtime  Dispense: 120 capsule; Refill: 3  2. Other depression After long discussion with wife and patient, patient does endorse depressed moods and anger outbursts that were not present prior to his stroke.  Does endorse history of anxiety and was previously prescribed Xanax.  Patient education given on mental health effects of long-term disabilities.  Patient agreeable to trial of Celexa.  Patient declines CBT at this time - citalopram (CELEXA) 20 MG tablet; Take 1 tablet (20 mg total) by mouth daily.  Dispense: 30 tablet; Refill: 3  3. Left elbow pain Consider referral to orthopedics  4. Essential hypertension Continue current regimen, no refills needed today  5. Stroke, hemorrhagic (HCC)   6. Dysphagia, post-stroke   I have reviewed the patient's medical history (PMH, PSH, Social History, Family History, Medications, and allergies) , and have been updated if relevant. I spent 30 minutes reviewing chart and  face to face time with patient.    Meds ordered this encounter  Medications  . gabapentin (NEURONTIN) 300 MG capsule    Sig: Take 300 mg at 8 am and 4 pm and 600 mg at bedtime    Dispense:  120 capsule    Refill:  3    Dosing change    Order  Specific Question:   Supervising Provider    Answer:   Shan LevansWRIGHT, PATRICK E [[id number]]  . citalopram (CELEXA) 20 MG tablet    Sig: Take 1 tablet (20 mg total) by mouth daily.    Dispense:  30 tablet    Refill:  3    Order Specific Question:   Supervising Provider    Answer:   Storm FriskWRIGHT, PATRICK E [[id number]]    Follow-up: [facility] in about 20 days (around 02/13/2021[facility]    Kasandra Knudsenari S Mayers, PA-C

## 2021-01-26 DIAGNOSIS — M25522 Pain in left elbow: Secondary | ICD-10-CM | POA: Insufficient documentation

## 2021-01-26 DIAGNOSIS — F32A Depression, unspecified: Secondary | ICD-10-CM | POA: Insufficient documentation

## 2021-01-30 MED FILL — LISINOPRIL 20 MG TABLET: 20 | 30 days supply | Qty: 60 | Fill #2

## 2021-01-30 MED FILL — PANTOPRAZOLE SOD DR 40 MG T: 40 | 30 days supply | Qty: 30 | Fill #1

## 2021-01-31 MED FILL — CITALOPRAM HBR 20 MG TABLET: 20 | 30 days supply | Qty: 30 | Fill #0

## 2021-01-31 MED FILL — GABAPENTIN 300 MG CAPSULE: 300 | 30 days supply | Qty: 120 | Fill #0

## 2021-02-08 MED FILL — LABETALOL HCL 300 MG TABLET: 300 | 30 days supply | Qty: 90 | Fill #3

## 2021-02-08 MED FILL — AMLODIPINE BESYLATE 10 MG T: 10 | 30 days supply | Qty: 30 | Fill #3

## 2021-02-08 MED FILL — hydrALAZINE HCL 50 MG TABS: 50 | 30 days supply | Qty: 90 | Fill #2

## 2021-02-12 DIAGNOSIS — R011 Cardiac murmur, unspecified: Secondary | ICD-10-CM

## 2021-02-12 DIAGNOSIS — E669 Obesity, unspecified: Secondary | ICD-10-CM | POA: Insufficient documentation

## 2021-02-12 DIAGNOSIS — M549 Dorsalgia, unspecified: Secondary | ICD-10-CM | POA: Insufficient documentation

## 2021-02-12 HISTORY — DX: Cardiac murmur, unspecified: R01.1

## 2021-02-12 NOTE — Progress Notes (Deleted)
Subjective:    Patient ID: Jason Romero, male    DOB: 27-Feb-1971, 50 y.o.   MRN: [id number]  50 y.o.M here to est PCP   Hx HTN, CVA  02/13/21  Last seen 01/24/21 per [place]:  [name] reports that he needs a form filled out on his behalf stating his current diagnosis.  Also complains of increased pain in his left elbow and difficulty getting appointments   States that he has had previous surgery on his left elbow, states that he has "screws and rods" in there.  States that the gabapentin offers some relief, but does make him sleepy.   States that he has been having increased  Pain and swelling in his left hand.  States that he is using his brace while sleeping and keeps it elevated during the day.   Wife is present and helps with history due to dysphagia.  States that they have been frustrated with appointments being cancelled and they are working on getting his disability and medicaid approved.  States that he has not had any PT or OT at home due to financial constraints.      Cardiovascular and Mediastinum  Essential hypertension    Digestive  Dysphagia, post-stroke    Nervous and Auditory  Stroke, hemorrhagic (HCC)  Spastic hemiplegia affecting nondominant side (HCC) - Primary  Relevant Medications  gabapentin (NEURONTIN) 300 MG capsule  Other Visit Diagnoses   Other depression      Relevant Medications  citalopram (CELEXA) 20 MG tablet  Left elbow pain      1. Spastic hemiplegia affecting nondominant side (HCC) Increase gabapentin dosing, bedtime dosing to 600, keeping 1 unit afternoon dosing same.  [facility] [name] on February 13, 2021.  Paperwork filled out on patient's behalf.  Patient encouraged to continue follow-up with disability and Medicaid. - gabapentin (NEURONTIN) 300 MG capsule; Take 300 mg at 8 am and 4 pm and 600 mg at bedtime  Dispense: 120  capsule; Refill: 3  2. Other depression After long discussion with wife and patient, patient does endorse depressed moods and anger outbursts that were not present prior to his stroke.  Does endorse history of anxiety and was previously prescribed Xanax.  Patient education given on mental health effects of long-term disabilities.  Patient agreeable to trial of Celexa.  Patient declines CBT at this time - citalopram (CELEXA) 20 MG tablet; Take 1 tablet (20 mg total) by mouth daily.  Dispense: 30 tablet; Refill: 3  3. Left elbow pain Consider referral to orthopedics  4. Essential hypertension Continue current regimen, no refills needed today  5. Stroke, hemorrhagic (HCC)   6. Dysphagia, post-stroke  Adm 09/2020 for CVA:  Admit date: 09/20/2020 Discharge date: 10/19/2020  Discharge Diagnoses:  Principal Problem:   ICH (intracerebral hemorrhage) (HCC) Active Problems:   Acute blood loss anemia   Spastic hemiplegia affecting nondominant side (HCC)   Dysphagia, post-stroke   Labile blood pressure History of tobacco abuse as well as marijuana Obesity Reportedly history of atrial fibrillation Remote seizure history   Discharged Condition: Stable  Significant Diagnostic Studies:  Imaging Results DG Swallowing Func-Speech Pathology  Result Date: 10/13/2020 Objective Swallowing Evaluation: Type of Study: MBS-Modified Barium Swallow Study  Patient Details Name: Jason Romero MRN: [id number] Date of Birth: 03/13/1971 Today's Date: 10/13/2020 Past Medical History: Past Medical History: Diagnosis Date . A-fib (HCC)  . Chronic headaches  . COPD (  chronic obstructive pulmonary disease) (HCC)  . GERD (gastroesophageal reflux disease)  . History of blood transfusion  . Hypertension  . MI (myocardial infarction) (HCC)  . Seizures (HCC)  Past Surgical History: Past Surgical History: Procedure Laterality Date . CHOLECYSTECTOMY   HPI: The pt is a 50 yo male presenting with R-sided  weakness and found to have intraparenchymal hematoma in the left basal ganglia on original CT. Repeat imaging revealed increased hemorrhage with 3mm midline shift. PMH includes: GERD, seizures, HTN, COPD, MI, afib, and current tobacco use. Admitted to CIR 09/20/20, previous MBS 09/16/20 and 09/28/20.  Assessment / Plan / Recommendation CHL IP CLINICAL IMPRESSIONS 10/13/2020 Clinical Impression --Pt presents with moderate oropharyngeal dysphagia, but some improvement in swallow function noted since last MBSS 09/28/20. Thin barium consistently penetrated into the laryngeal vestibule at various levels, sometimes reaching the level of the vocal folds, and in once instance being aspirated prior to swallow initiation. Pt's cough response was not effective to completely clear aspirates and penetrates. He was unable to trigger volitional coughs or extra dry swallows (due to suspected apraxia/motor deficits) to clear penetrates or vallecular residue, which accumulated from lingual residue of thin which then fell into vallecular sinuses after initial swallow. Pt able to effectively use chin tuck maneuver to prevent aspiration of thin barium, although mild penetration above the vocal folds still observed X1. Nectar thick barium was consumed with timely swallow initiation, minimal vallecular residue (less than thin, perhaps due to increased weight of bolus aiding in inferior transit). Pt also demonstrated ability to consume barium pill whole inside bite of puree without oral or pharyngeal abnormalities. However, in brief scan of esophagus, pill was observed to move slowly and stayed in lower esophagus for >2 mins. Recommend pt continue Dys 3 (mechanical soft) solids, upgrade to nectar thick liquids, pills whole in purees one at a time, follow solids with liquid, small bites/sips, slow rate, remain seated upright for at least 30 mins after intake, full supervision to assist with implementation of swallow strategies. Pt may also  begin Frasier Water Protocol WITH CHIN TUCK. ST will continue to follow pt to ensure diet safety, efficiency, and skilled interventions to work toward greater independence with swallow strategies and potential diet advancement.  SLP Visit Diagnosis Dysphagia, oropharyngeal phase (R13.12);Aphasia (R47.01) Attention and concentration deficit following -- Frontal lobe and executive function deficit following -- Impact on safety and function Moderate aspiration risk   CHL IP TREATMENT RECOMMENDATION 09/16/2020 Treatment Recommendations Therapy as outlined in treatment plan below   Prognosis 09/16/2020 Prognosis for Safe Diet Advancement Good Barriers to Reach Goals Cognitive deficits;Language deficits Barriers/Prognosis Comment -- CHL IP DIET RECOMMENDATION 10/13/2020 SLP Diet Recommendations Dysphagia 3 (Mech soft) solids;Nectar thick liquid;Other (Comment) Liquid Administration via Cup;Straw Medication Administration Whole meds with puree Compensations Minimize environmental distractions;Small sips/bites;Slow rate;Lingual sweep for clearance of pocketing;Follow solids with liquid;Other (Comment) Postural Changes Remain semi-upright after after feeds/meals (Comment);Seated upright at 90 degrees   CHL IP OTHER RECOMMENDATIONS 10/13/2020 Recommended Consults -- Oral Care Recommendations Oral care BID Other Recommendations --   CHL IP FOLLOW UP RECOMMENDATIONS 09/19/2020 Follow up Recommendations Inpatient Rehab   CHL IP FREQUENCY AND DURATION 09/16/2020 Speech Therapy Frequency (ACUTE ONLY) min 2x/week Treatment Duration 2 weeks      CHL IP ORAL PHASE 10/13/2020 Oral Phase Impaired Oral - Pudding Teaspoon -- Oral - Pudding Cup -- Oral - Honey Teaspoon NT Oral - Honey Cup NT Oral - Nectar Teaspoon NT Oral - Nectar Cup Lingual/palatal residue Oral -  Nectar Straw WFL Oral - Thin Teaspoon NT Oral - Thin Cup Decreased bolus cohesion;Piecemeal swallowing;Lingual/palatal residue Oral - Thin Straw -- Oral - Puree NT Oral - Mech Soft NT  Oral - Regular -- Oral - Multi-Consistency -- Oral - Pill WFL Oral Phase - Comment --  CHL IP PHARYNGEAL PHASE 10/13/2020 Pharyngeal Phase Impaired Pharyngeal- Pudding Teaspoon -- Pharyngeal -- Pharyngeal- Pudding Cup -- Pharyngeal -- Pharyngeal- Honey Teaspoon NT Pharyngeal -- Pharyngeal- Honey Cup NT Pharyngeal -- Pharyngeal- Nectar Teaspoon NT Pharyngeal -- Pharyngeal- Nectar Cup Pharyngeal residue - valleculae Pharyngeal Material does not enter airway Pharyngeal- Nectar Straw Delayed swallow initiation-vallecula Pharyngeal -- Pharyngeal- Thin Teaspoon NT Pharyngeal -- Pharyngeal- Thin Cup Delayed swallow initiation-vallecula;Penetration/Aspiration before swallow;Pharyngeal residue - valleculae;Reduced airway/laryngeal closure;Compensatory strategies attempted (with notebox) Pharyngeal Material enters airway, passes BELOW cords and not ejected out despite cough attempt by patient;Material enters airway, CONTACTS cords and not ejected out;Material enters airway, remains ABOVE vocal cords and not ejected out Pharyngeal- Thin Straw -- Pharyngeal -- Pharyngeal- Puree NT Pharyngeal -- Pharyngeal- Mechanical Soft NT Pharyngeal -- Pharyngeal- Regular -- Pharyngeal -- Pharyngeal- Multi-consistency -- Pharyngeal -- Pharyngeal- Pill WFL Pharyngeal -- Pharyngeal Comment --  CHL IP CERVICAL ESOPHAGEAL PHASE 10/13/2020 Cervical Esophageal Phase WFL Pudding Teaspoon -- Pudding Cup -- Honey Teaspoon -- Honey Cup -- Nectar Teaspoon -- Nectar Cup -- Nectar Straw -- Thin Teaspoon -- Thin Cup -- Thin Straw -- Puree -- Mechanical Soft -- Regular -- Multi-consistency -- Pill -- Cervical Esophageal Comment -- [name] 10/13/2020, 12:27 PM              DG Swallowing Func-Speech Pathology  Result Date: 09/28/2020 Objective Swallowing Evaluation: Type of Study: MBS-Modified Barium Swallow Study  Patient Details Name: DESMEN SCHOFFSTALL MRN: [id number] Date of Birth: December 28, 1970 Today's Date: 09/28/2020 Past Medical History: Past  Medical History: Diagnosis Date . A-fib (HCC)  . Chronic headaches  . COPD (chronic obstructive pulmonary disease) (HCC)  . GERD (gastroesophageal reflux disease)  . History of blood transfusion  . Hypertension  . MI (myocardial infarction) (HCC)  . Seizures (HCC)  Past Surgical History: Past Surgical History: Procedure Laterality Date . CHOLECYSTECTOMY   HPI: The pt is a 50 yo male presenting with R-sided weakness and found to have intraparenchymal hematoma in the left basal ganglia on original CT. Repeat imaging revealed increased hemorrhage with 12mm midline shift. PMH includes: GERD, seizures, HTN, COPD, MI, afib, and current tobacco use. Admitted to CIR 09/20/20  Subjective: alert, not consistently following commands Assessment / Plan / Recommendation CHL IP CLINICAL IMPRESSIONS 09/28/2020 Clinical Impression -- Although oropharyngeal dysphagia still evident, pt's swallow function is improving in comparison to last MBSS 09/16/20. Pt's mastication of soft solids was mildly prolonged, however efficient; he demonstrated much greater oral control of boluses, and bolus cohesion of Dys 1 and Dys 3 textures. Aspiration X2 with delayed cough response and deep penetration noted during trials of thin barium, most likely due to pt's delayed initiation of swallow sequence, which occurred at the level of the pyriform sinuses. Penetration to the level of the vocal folds without ejection also noted with nectar. Due to receptive and expressive language and cognitive deficits, pt was unable to follow any commands for compensatory strategies or maneuvers, therefore, not viable option at this time. Honey thick barium resulted in a slightly more timely swallow initiation, and reduction in depth and frequency of penetration into laryngeal vestibule (remained above vocal folds) and some was ejected during swallow). Given results of today's  study, would recommend pt begin a Dys 3 (mechanical soft solid) texture diet, honey thick  liquids, and medications whole in purees. He will require full supervision during meals to ensure use of swallow strategies and reduce impulsivity during intake. ST will continue to provide skilled interventions to work toward further advancement and increase independence with use of safe swallow strategies.  SLP Visit Diagnosis Dysphagia, oropharyngeal phase (R13.12);Aphasia (R47.01) Attention and concentration deficit following -- Frontal lobe and executive function deficit following -- Impact on safety and function Moderate aspiration risk   CHL IP TREATMENT RECOMMENDATION 09/16/2020 Treatment Recommendations Therapy as outlined in treatment plan below   Prognosis 09/16/2020 Prognosis for Safe Diet Advancement Good Barriers to Reach Goals Cognitive deficits;Language deficits Barriers/Prognosis Comment -- CHL IP DIET RECOMMENDATION 09/28/2020 SLP Diet Recommendations Honey thick liquids;Dysphagia 3 (Mech soft) solids Liquid Administration via Cup Medication Administration Whole meds with puree Compensations Minimize environmental distractions;Small sips/bites;Slow rate;Lingual sweep for clearance of pocketing Postural Changes Remain semi-upright after after feeds/meals (Comment)   CHL IP OTHER RECOMMENDATIONS 09/28/2020 Recommended Consults -- Oral Care Recommendations Oral care QID Other Recommendations Have oral suction available   CHL IP FOLLOW UP RECOMMENDATIONS 09/19/2020 Follow up Recommendations Inpatient Rehab   CHL IP FREQUENCY AND DURATION 09/16/2020 Speech Therapy Frequency (ACUTE ONLY) min 2x/week Treatment Duration 2 weeks      CHL IP ORAL PHASE 09/28/2020 Oral Phase Impaired Oral - Pudding Teaspoon -- Oral - Pudding Cup -- Oral - Honey Teaspoon NT Oral - Honey Cup Delayed oral transit;Lingual/palatal residue Oral - Nectar Teaspoon NT Oral - Nectar Cup Lingual/palatal residue Oral - Nectar Straw -- Oral - Thin Teaspoon NT Oral - Thin Cup Decreased bolus cohesion;Piecemeal swallowing Oral - Thin Straw --  Oral - Puree WFL Oral - Mech Soft Delayed oral transit Oral - Regular -- Oral - Multi-Consistency -- Oral - Pill -- Oral Phase - Comment --  CHL IP PHARYNGEAL PHASE 09/28/2020 Pharyngeal Phase Impaired Pharyngeal- Pudding Teaspoon -- Pharyngeal -- Pharyngeal- Pudding Cup -- Pharyngeal -- Pharyngeal- Honey Teaspoon NT Pharyngeal -- Pharyngeal- Honey Cup Delayed swallow initiation-vallecula;Delayed swallow initiation-pyriform sinuses;Reduced anterior laryngeal mobility;Penetration/Aspiration during swallow Pharyngeal Material enters airway, remains ABOVE vocal cords then ejected out;Material enters airway, remains ABOVE vocal cords and not ejected out Pharyngeal- Nectar Teaspoon NT Pharyngeal -- Pharyngeal- Nectar Cup Penetration/Aspiration during swallow;Delayed swallow initiation-pyriform sinuses;Reduced anterior laryngeal mobility Pharyngeal Material enters airway, CONTACTS cords and not ejected out Pharyngeal- Nectar Straw -- Pharyngeal -- Pharyngeal- Thin Teaspoon NT Pharyngeal -- Pharyngeal- Thin Cup Penetration/Aspiration during swallow;Reduced anterior laryngeal mobility;Delayed swallow initiation-pyriform sinuses Pharyngeal Material enters airway, passes BELOW cords and not ejected out despite cough attempt by patient Pharyngeal- Thin Straw -- Pharyngeal -- Pharyngeal- Puree Delayed swallow initiation-vallecula;Reduced anterior laryngeal mobility;Pharyngeal residue - valleculae Pharyngeal -- Pharyngeal- Mechanical Soft Delayed swallow initiation-vallecula;Reduced anterior laryngeal mobility;Pharyngeal residue - valleculae Pharyngeal -- Pharyngeal- Regular -- Pharyngeal -- Pharyngeal- Multi-consistency -- Pharyngeal -- Pharyngeal- Pill -- Pharyngeal -- Pharyngeal Comment --  CHL IP CERVICAL ESOPHAGEAL PHASE 09/28/2020 Cervical Esophageal Phase WFL Pudding Teaspoon -- Pudding Cup -- Honey Teaspoon -- Honey Cup -- Nectar Teaspoon -- Nectar Cup -- Nectar Straw -- Thin Teaspoon -- Thin Cup -- Thin Straw -- Puree  -- Mechanical Soft -- Regular -- Multi-consistency -- Pill -- Cervical Esophageal Comment -- [name] 09/28/2020, 9:56 AM               Labs:  Basic Metabolic Panel: Last Labs  No results for input(s): NA, K, CL, CO2, GLUCOSE,  BUN, CREATININE, CALCIUM, MG, PHOS in the last 168 hours.   CBC: Last Labs  No results for input(s): WBC, NEUTROABS, HGB, HCT, MCV, PLT in the last 168 hours.   CBG: Last Labs  No results for input(s): GLUCAP in the last 168 hours.   Family history.  Mother with CAD.  Denies any colon cancer esophageal cancer or rectal cancer  Brief HPI:   CONSTANTINOS KREMPASKY is a 50 y.o. right-handed male with history of COPD tobacco abuse hypertension seizures on no seizure medication atrial fibrillation not on anticoagulation did not follow-up cardiology services.  Per chart review lives with spouse independent prior to admission.  Presented 09/11/2020 with right side weakness facial droop.  Blood pressure 195/127.  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  No shift or hydrocephalus.  CTA of head and neck no stenosis identified.  Echocardiogram with ejection fraction of 50 to 55% no wall motion abnormalities.  Admission chemistries unremarkable except glucose 114 potassium 2.8 urine drug screen positive amphetamines and marijuana, urinalysis negative nitrite.  Neurology follow-up placed on 3% saline.  ICH felt to be related to hypertensive crisis.  Placed on Cleviprex for blood pressure control.  Initially n.p.o. with alternative means of nutritional support.  Therapy evaluations completed and patient was admitted for a comprehensive rehab program   Hospital Course: MOHANAD CARSTEN was admitted to rehab 09/20/2020 for inpatient therapies to consist of PT, ST and OT at least three hours five days a week. Past admission physiatrist, therapy team and rehab RN have worked together to provide customized collaborative inpatient rehab.   Pertaining to patient's left basal ganglia ICH related to hypertensive crisis remained stable he would follow-up neurology services.  SCDs for DVT prophylaxis.  Dysphagia improved diet has been advanced to a mechanical soft nectar thick liquid.  Blood pressure monitored on Norvasc 10 mg daily as well as hydralazine with Normodyne 300 mg every 8 hours and lisinopril 20 mg twice daily.  Patient would need follow-up with primary MD.  History of tobacco marijuana use patient received counsel guard cessation of nicotine and illicit drug use.   Blood pressures were monitored on TID basis and controlled    Rehab course: During patient's stay in rehab weekly team conferences were held to monitor patient's progress, set goals and discuss barriers to discharge. At admission, patient required moderate assist supine to sit moderate assist sit to supine +2 physical assist sit to stand.  Max assist toilet transfers max assist lower body dressing supervision grooming       Review of Systems     Objective:   Physical Exam        Assessment & Plan:

## 2021-02-13 ENCOUNTER — Encounter: Payer: Self-pay | Admitting: Critical Care Medicine

## 2021-02-13 ENCOUNTER — Ambulatory Visit: Payer: Medicaid Other | Attending: Critical Care Medicine | Admitting: Critical Care Medicine

## 2021-02-13 ENCOUNTER — Other Ambulatory Visit: Payer: Self-pay

## 2021-02-13 VITALS — BP 138/80 | HR 73

## 2021-02-13 DIAGNOSIS — F17210 Nicotine dependence, cigarettes, uncomplicated: Secondary | ICD-10-CM

## 2021-02-13 DIAGNOSIS — I10 Essential (primary) hypertension: Secondary | ICD-10-CM

## 2021-02-13 DIAGNOSIS — H539 Unspecified visual disturbance: Secondary | ICD-10-CM | POA: Diagnosis not present

## 2021-02-13 DIAGNOSIS — J42 Unspecified chronic bronchitis: Secondary | ICD-10-CM

## 2021-02-13 DIAGNOSIS — Z1211 Encounter for screening for malignant neoplasm of colon: Secondary | ICD-10-CM

## 2021-02-13 DIAGNOSIS — I619 Nontraumatic intracerebral hemorrhage, unspecified: Secondary | ICD-10-CM | POA: Diagnosis not present

## 2021-02-13 DIAGNOSIS — R269 Unspecified abnormalities of gait and mobility: Secondary | ICD-10-CM | POA: Diagnosis not present

## 2021-02-13 DIAGNOSIS — I69398 Other sequelae of cerebral infarction: Secondary | ICD-10-CM | POA: Diagnosis not present

## 2021-02-13 DIAGNOSIS — M79604 Pain in right leg: Secondary | ICD-10-CM

## 2021-02-13 DIAGNOSIS — E785 Hyperlipidemia, unspecified: Secondary | ICD-10-CM | POA: Diagnosis not present

## 2021-02-13 DIAGNOSIS — I610 Nontraumatic intracerebral hemorrhage in hemisphere, subcortical: Secondary | ICD-10-CM | POA: Diagnosis not present

## 2021-02-13 DIAGNOSIS — M792 Neuralgia and neuritis, unspecified: Secondary | ICD-10-CM

## 2021-02-13 DIAGNOSIS — M25522 Pain in left elbow: Secondary | ICD-10-CM

## 2021-02-13 DIAGNOSIS — Z1159 Encounter for screening for other viral diseases: Secondary | ICD-10-CM | POA: Diagnosis not present

## 2021-02-13 DIAGNOSIS — I480 Paroxysmal atrial fibrillation: Secondary | ICD-10-CM | POA: Diagnosis not present

## 2021-02-13 DIAGNOSIS — F3289 Other specified depressive episodes: Secondary | ICD-10-CM | POA: Diagnosis not present

## 2021-02-13 DIAGNOSIS — Z720 Tobacco use: Secondary | ICD-10-CM

## 2021-02-13 MED ORDER — CETIRIZINE HCL 10 MG PO TABS
10.0000 mg | ORAL_TABLET | Freq: Every day | ORAL | 2 refills | Status: DC
Start: 2021-02-13 — End: 2021-02-13
  Filled 2021-02-13: qty 5, 5d supply, fill #0

## 2021-02-13 MED ORDER — ATORVASTATIN CALCIUM 20 MG PO TABS
20.0000 mg | ORAL_TABLET | Freq: Every day | ORAL | 1 refills | Status: DC
  Filled 2021-02-13: qty 30, 30d supply, fill #0
  Filled 2021-03-13: qty 30, 30d supply, fill #1

## 2021-02-13 MED ORDER — NICOTINE POLACRILEX 2 MG MT LOZG
2.0000 mg | LOZENGE | OROMUCOSAL | 0 refills | Status: DC | PRN
  Filled 2021-02-13: qty 100, fill #0

## 2021-02-13 MED ORDER — TRAZODONE HCL 50 MG PO TABS
25.0000 mg | ORAL_TABLET | Freq: Every evening | ORAL | 1 refills | Status: DC | PRN
  Filled 2021-02-13: qty 30, 30d supply, fill #0
  Filled 2021-03-13: qty 30, 30d supply, fill #1

## 2021-02-13 MED ORDER — SERTRALINE HCL 50 MG PO TABS
50.0000 mg | ORAL_TABLET | Freq: Every day | ORAL | 3 refills | Status: DC
  Filled 2021-02-13: qty 30, 30d supply, fill #0

## 2021-02-13 NOTE — Assessment & Plan Note (Signed)
Stable BP today is 138/80 No changes to medications, continue treatment regimen.

## 2021-02-13 NOTE — Assessment & Plan Note (Addendum)
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 The patient reports a burning sensation in the affected arm but denies spasticity in his legs. The patient reports minor relief of symptoms with gabapentin and an inability to sleep due to nerve pain.  Ambulatory referral to Neurology for nerve pain Home Health -     Face-to-face encounter (required for Medicare/Medicaid patients)  -     Lipid Panel -     atorvastatin (LIPITOR) 20 MG tablet; Take 1 tablet (20 mg total) by mouth daily.

## 2021-02-13 NOTE — Patient Instructions (Addendum)
Medication have been sent to your pharmacy Referral made to Orthopedists, neurology, PT and the opthalmology. We screened you today for hepatitis C Nicotine lozenge sent  We checked your cholesterol today  See Dr [name] in 2 months  Smoking Tobacco Information, Adult Smoking tobacco can be harmful to your health. Tobacco contains a poisonous (toxic), colorless chemical called nicotine. Nicotine is addictive. It changes the brain and can make it hard to stop smoking. Tobacco also has other toxic chemicals that can hurt your body and raise your risk of many cancers. How can smoking tobacco affect me? Smoking tobacco puts you at risk for:  Cancer. Smoking is most commonly associated with lung cancer, but can also lead to cancer in other parts of the body.  Chronic obstructive pulmonary disease (COPD). This is a long-term lung condition that makes it hard to breathe. It also gets worse over time.  High blood pressure (hypertension), heart disease, stroke, or heart attack.  Lung infections, such as pneumonia.  Cataracts. This is when the lenses in the eyes become clouded.  Digestive problems. This may include peptic ulcers, heartburn, and gastroesophageal reflux disease (GERD).  Oral health problems, such as gum disease and tooth loss.  Loss of taste and smell. Smoking can affect your appearance by causing:  Wrinkles.  Yellow or stained teeth, fingers, and fingernails. Smoking tobacco can also affect your social life, because:  It may be challenging to find places to smoke when away from home. Many workplaces, [employer], hotels, and public places are tobacco-free.  Smoking is expensive. This is due to the cost of tobacco and the long-term costs of treating health problems from smoking.  Secondhand smoke may affect those around you. Secondhand smoke can cause lung cancer, breathing problems, and heart disease. Children of smokers have a higher risk for: ? Sudden infant death  syndrome (SIDS). ? Ear infections. ? Lung infections. If you currently smoke tobacco, quitting now can help you:  Lead a longer and healthier life.  Look, smell, breathe, and feel better over time.  Save money.  Protect others from the harms of secondhand smoke. What actions can I take to prevent health problems? Quit smoking  Do not start smoking. Quit if you already do.  Make a plan to quit smoking and commit to it. Look for programs to help you and ask your health care provider for recommendations and ideas.  Set a date and write down all the reasons you want to quit.  Let your friends and family know you are quitting so they can help and support you. Consider finding friends who also want to quit. It can be easier to quit with someone else, so that you can support each other.  Talk with your health care provider about using nicotine replacement medicines to help you quit, such as gum, lozenges, patches, sprays, or pills.  Do not replace cigarette smoking with electronic cigarettes, which are commonly called e-cigarettes. The safety of e-cigarettes is not known, and some may contain harmful chemicals.  If you try to quit but return to smoking, stay positive. It is common to slip up when you first quit, so take it one day at a time.  Be prepared for cravings. When you feel the urge to smoke, chew gum or suck on hard candy.   Lifestyle  Stay busy and take care of your body.  Drink enough fluid to keep your urine pale yellow.  Get plenty of exercise and eat a healthy diet. This can help  prevent weight gain after quitting.  Monitor your eating habits. Quitting smoking can cause you to have a larger appetite than when you smoke.  Find ways to relax. Go out with friends or family to a movie or a restaurant where people do not smoke.  Ask your health care provider about having regular tests (screenings) to check for cancer. This may include blood tests, imaging tests, and other  tests.  Find ways to manage your stress, such as meditation, yoga, or exercise. Where to find support To get support to quit smoking, consider:  Asking your health care provider for more information and resources.  Taking classes to learn more about quitting smoking.  Looking for local organizations that offer resources about quitting smoking.  Joining a support group for people who want to quit smoking in your local community.  Calling the smokefree.gov counselor helpline: 1-800-Quit-Now (772)634-3601) Where to find more information You may find more information about quitting smoking from:  HelpGuide.org: www.helpguide.org  [employer]: smokefree.gov  American Lung Association: www.lung.org Contact a health care provider if you:  Have problems breathing.  Notice that your lips, nose, or fingers turn blue.  Have chest pain.  Are coughing up blood.  Feel faint or you pass out.  Have other health changes that cause you to worry. Summary  Smoking tobacco can negatively affect your health, the health of those around you, your finances, and your social life.  Do not start smoking. Quit if you already do. If you need help quitting, ask your health care provider.  Think about joining a support group for people who want to quit smoking in your local community. There are many effective programs that will help you to quit this behavior. This information is not intended to replace advice given to you by your health care provider. Make sure you discuss any questions you have with your health care provider. Document Revised: 07/24/2019 Document Reviewed: 11/13/2016 Elsevier Patient Education  2021 [employer].

## 2021-02-13 NOTE — Progress Notes (Signed)
Established Patient Office Visit  Subjective:  Patient ID: Jason Romero, male    DOB: 1971/10/04  Age: 50 y.o. MRN: [id number]  CC:  Chief Complaint  Patient presents with  . Establish Care    HPI Jason PraderGlenn H Romero  is a 50 y.o left-handed M here today to establish care. 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 The patient reports a burning sensation in the affected arm but denies spasticity in his legs. The patient reports minor relief of symptoms with gabapentin and an inability to sleep due to nerve pain. The patient can stand for a few seconds but is unable to walk and cannot use a walker. The patient denies urinary incontinence and reports utilizing a bedside toilet. The patient can swallow and eat without difficulties.   hosp records reviewed  The patient reports depressive moods but denies suicidal thoughts and ideation. At today's visit, the patient blood pressure is stable, and he denies headaches and dizziness but reports fogginess in his vision. The patient has not had an eye exam and verbalized the need for glasses. The patient smokes marijuana and reports that 1 pack of cigarettes lasts him 2 days.  Past Medical History:  Diagnosis Date  . A-fib (HCC)   . Chronic headaches   . COPD (chronic obstructive pulmonary disease) (HCC)   . GERD (gastroesophageal reflux disease)   . History of blood transfusion   . Hypertension   . MI (myocardial infarction) (HCC)   . Seizures (HCC)     Past Surgical History:  Procedure Laterality Date  . CHOLECYSTECTOMY      Family History  Problem Relation Age of Onset  . Alcoholism Other   . Arthritis Other   . Breast cancer Other   . Heart disease Mother     Social History   Socioeconomic History  . Marital status: Married    Spouse name: Not on file  . Number of children: Not on file  . Years of education: Not on file  . Highest education level: Not  on file  Occupational History  . Not on file  Tobacco Use  . Smoking status: Current Every Day Smoker    Packs/day: 1.00  . Smokeless tobacco: Never Used  Substance and Sexual Activity  . Alcohol use: Yes    Alcohol/week: 0.0 standard drinks  . Drug use: Yes    Types: Marijuana  . Sexual activity: Not Currently  Other Topics Concern  . Not on file  Social History Narrative  . Not on file   Social Determinants of Health   Financial Resource Strain: Not on file  Food Insecurity: Not on file  Transportation Needs: Not on file  Physical Activity: Not on file  Stress: Not on file  Social Connections: Not on file  Intimate Partner Violence: Not on file    Outpatient Medications Prior to Visit  Medication Sig Dispense Refill  . acetaminophen (TYLENOL) 325 MG tablet Take 2 tablets (650 mg total) by mouth every 4 (four) hours as needed for mild pain (or temp > 37.5 C (99.5 F)).    Marland Kitchen. amLODipine (NORVASC) 10 MG tablet TAKE 1 TABLET (10 MG TOTAL) BY MOUTH DAILY. 30 tablet 3  . citalopram (CELEXA) 20 MG tablet TAKE 1 TABLET (20 MG TOTAL) BY MOUTH DAILY. 30 tablet 3  . gabapentin (NEURONTIN) 300 MG capsule TAKE 1 CAPSULE AT 8 AM AND 4 PM AND 2  CAPSULES AT BEDTIME 120 capsule 3  . hydrALAZINE (APRESOLINE) 50 MG tablet TAKE 1 TABLET (50 MG TOTAL) BY MOUTH 3 (THREE) TIMES DAILY. 90 tablet 2  . labetalol (NORMODYNE) 300 MG tablet TAKE 1 TABLET (300 MG TOTAL) BY MOUTH EVERY 8 (EIGHT) HOURS. 90 tablet 3  . lisinopril (ZESTRIL) 20 MG tablet TAKE 1 TABLET (20 MG TOTAL) BY MOUTH 2 (TWO) TIMES DAILY. 60 tablet 3  . pantoprazole (PROTONIX) 40 MG tablet TAKE 1 TABLET (40 MG TOTAL) BY MOUTH DAILY. 30 tablet 2  . senna (SENOKOT) 8.6 MG TABS tablet Take 1 tablet (8.6 mg total) by mouth 2 (two) times daily. (Patient not taking: No sig reported) 120 tablet 0   No facility-administered medications prior to visit.    Allergies  Allergen Reactions  . Hydrocodone Hives  . Ibuprofen Other (See Comments)     Pt can't take this medication because it interacts with other medications that he is taking.     ROS Review of Systems  Constitutional: Negative for fever.  HENT: Negative for rhinorrhea, sinus pain, sore throat and trouble swallowing.   Eyes: Positive for visual disturbance.  Respiratory: Negative for choking and shortness of breath.   Cardiovascular: Positive for leg swelling. Negative for chest pain.  Gastrointestinal: Negative for constipation and diarrhea.  Endocrine: Negative for polydipsia and polyphagia.  Genitourinary: Negative for difficulty urinating.  Musculoskeletal: Positive for myalgias. Negative for back pain.  Skin: Negative for pallor and rash.  Allergic/Immunologic: Negative.   Neurological: Negative for tremors, weakness, light-headedness and headaches.  Psychiatric/Behavioral: Positive for dysphoric mood and sleep disturbance. Negative for suicidal ideas.      Objective:    Physical Exam Constitutional:      Appearance: Normal appearance.  HENT:     Head: Normocephalic.     Nose: Nose normal. No congestion or rhinorrhea.     Mouth/Throat:     Mouth: Mucous membranes are moist.     Pharynx: No oropharyngeal exudate or posterior oropharyngeal erythema.  Eyes:     General:        Left eye: No discharge.  Cardiovascular:     Rate and Rhythm: Normal rate and regular rhythm.     Pulses: Normal pulses.     Heart sounds: Normal heart sounds.  Pulmonary:     Effort: Pulmonary effort is normal.     Breath sounds: Normal breath sounds.  Abdominal:     General: Bowel sounds are normal.  Genitourinary:    Penis: Normal.   Musculoskeletal:     Cervical back: Normal range of motion.     Right lower leg: No edema.     Left lower leg: No edema.     Comments: Decreased active ROM, muscle tone, and finger grasp in the right hand.   Skin:    General: Skin is warm and dry.  Neurological:     Mental Status: He is alert and oriented to person, place, and time.      Motor: Weakness present.     Gait: Gait abnormal.     Comments: right side hemiparesis  Psychiatric:        Thought Content: Thought content normal.    BP 138/80   Pulse 73   SpO2 97%  Wt Readings from Last 3 Encounters:  10/19/20 103.2 kg  09/20/20 109.3 kg  03/11/17 103.4 kg     Health Maintenance Due  Topic Date Due  . COVID-19 Vaccine (1) Never done    There are no  preventive care reminders to display for this patient.  Lab Results  Component Value Date   TSH 0.64 03/22/2016   Lab Results  Component Value Date   WBC 8.4 11/09/2020   HGB 11.9 (L) 11/09/2020   HCT 35.8 (L) 11/09/2020   MCV 84 11/09/2020   PLT 391 11/09/2020   Lab Results  Component Value Date   NA 140 11/09/2020   K 4.1 11/09/2020   CO2 21 11/09/2020   GLUCOSE 89 11/09/2020   BUN 9 11/09/2020   CREATININE 0.93 11/09/2020   BILITOT 0.3 11/09/2020   ALKPHOS 81 11/09/2020   AST 14 11/09/2020   ALT 17 11/09/2020   PROT 6.9 11/09/2020   ALBUMIN 4.2 11/09/2020   CALCIUM 9.5 11/09/2020   ANIONGAP 13 10/07/2020   GFR 70.92 02/27/2017   Lab Results  Component Value Date   CHOL 122 09/12/2020   Lab Results  Component Value Date   HDL 40 (L) 09/12/2020   Lab Results  Component Value Date   LDLCALC 61 09/12/2020   Lab Results  Component Value Date   TRIG 104 09/12/2020   TRIG 114 09/12/2020   Lab Results  Component Value Date   CHOLHDL 3.1 09/12/2020   Lab Results  Component Value Date   HGBA1C 5.6 09/12/2020      Assessment & Plan:  Stroke, hemorrhagic (HCC) The patient had a hemorrhagic stroke on 09/11/20 with noted weakness and pain in the right leg, arm, and left elbow. The patient reports a burning sensation in the affected arm but denies spasticity in his legs. The patient reports minor relief of symptoms with gabapentin and an inability to sleep due to nerve pain.  Ambulatory referral to Neurology for nerve pain Home Health -     Face-to-face encounter (required  for Medicare/Medicaid patients)  -     Lipid Panel -     atorvastatin (LIPITOR) 20 MG tablet; Take 1 tablet (20 mg total) by mouth daily.      Depression The patient reports depressive moods but denies suicidal thoughts and ideation. sertraline (ZOLOFT) 50 MG tablet; Take 1 tablet (50 mg total) by mouth daily.  Essential hypertension Stable BP today is 138/80 No changes to medications, continue treatment regimen.   Left elbow pain Ambulatory referral to Orthopedic Surgery  Tobacco abuse The patient smokes marijuana and reports that 1 pack of cigarettes lasts him 2 days. The patient is open to quitting. nicotine polacrilex (NICORETTE) 2 MG lozenge; Take 1 lozenge (2 mg total) by mouth as needed for smoking cessation.    . Current smoking consumption amount: 2 cigars every two days  . Dicsussion on advise to quit smoking and smoking impacts: CV imparcts  . Patient's willingness to quit:  yes  . Methods to quit smoking discussed:  Nicotine replace  . Medication management of smoking session drugs discussed:nicotine lozenge  . Resources provided:  AVS   . Setting quit date not est  . Follow-up arranged 2 mo   Time spent counseling the patient:  5 min   Insomnia The patient reports minor relief of symptoms with gabapentin and an inability to sleep due to nerve pain. -  traZODone (DESYREL) 50 MG tablet; Take 0.5-1 tablets (25-50 mg total) by mouth at bedtime as needed for sleep.  Visual changes The reports fogginess in his vision. The patient has not had an eye exam and verbalized the need for glasses.  -     Ambulatory referral to Ophthalmology  Encounter for hepatitis  C screening test for low risk patient -     HCV Ab w/Rflx to Verification  Colon cancer screening -     Fecal occult blood, imunochemical    Problem List Items Addressed This Visit   None   Visit Diagnoses    Need for hepatitis C screening test    -  Primary   Relevant Orders   HCV Ab w  Reflex to Quant PCR   Colon cancer screening       Relevant Orders   Fecal occult blood, imunochemical      No orders of the defined types were placed in this encounter.   Follow-up: in 2 months  This encounter took with complex MDM  I have seen and examined this patient with the mid-level provider and agree with the above note . Shan Levans

## 2021-02-13 NOTE — Assessment & Plan Note (Signed)
The patient reports depressive moods but denies suicidal thoughts and ideation. sertraline (ZOLOFT) 50 MG tablet; Take 1 tablet (50 mg total) by mouth daily.

## 2021-02-13 NOTE — Assessment & Plan Note (Signed)
Ambulatory referral to Orthopedic Surgery

## 2021-02-13 NOTE — Assessment & Plan Note (Signed)
The patient smokes marijuana and reports that 1 pack of cigarettes lasts him 2 days. The patient is open to quitting. nicotine polacrilex (NICORETTE) 2 MG lozenge; Take 1 lozenge (2 mg total) by mouth as needed for smoking cessation.

## 2021-02-14 ENCOUNTER — Telehealth: Payer: Self-pay

## 2021-02-14 DIAGNOSIS — I69398 Other sequelae of cerebral infarction: Secondary | ICD-10-CM | POA: Insufficient documentation

## 2021-02-14 DIAGNOSIS — M79604 Pain in right leg: Secondary | ICD-10-CM | POA: Insufficient documentation

## 2021-02-14 DIAGNOSIS — E785 Hyperlipidemia, unspecified: Secondary | ICD-10-CM | POA: Insufficient documentation

## 2021-02-14 DIAGNOSIS — R269 Unspecified abnormalities of gait and mobility: Secondary | ICD-10-CM | POA: Insufficient documentation

## 2021-02-14 DIAGNOSIS — H539 Unspecified visual disturbance: Secondary | ICD-10-CM | POA: Insufficient documentation

## 2021-02-14 NOTE — Telephone Encounter (Signed)
I connected by phone with [name] and/or patient's caregiver on 02/14/2021 at 10:23 AM to discuss the potential vaccination through our Homebound vaccination initiative.   Prevaccination Checklist for COVID-19 Vaccines  1.  Are you feeling sick today? no  2.  Have you ever received a dose of a COVID-19 vaccine?  yes     If yes, which one? Pfizer   How many dose of Covid-19 vaccine have your received and dates ? 2, 04/02/20, 04/23/20   Check all that apply: I live in a long-term care setting. no  I have been diagnosed with a medical condition(s). 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 no  I work in a long-term care facility, correctional facility, hospital, restaurant, retail setting, school, or other setting with high exposure to the public. no  4. Do you have a health condition or are you undergoing treatment that makes you moderately or severely immunocompromised? (This would include treatment for cancer or HIV, receipt of organ transplant, immunosuppressive therapy or high-dose corticosteroids, CAR-T-cell therapy, hematopoietic cell transplant [HCT], DiGeorge syndrome or Wiskott-Aldrich syndrome)  no  5. Have you received hematopoietic cell transplant (HCT) or CAR-T-cell therapies since receiving COVID-19 vaccine? no  6.  Have you ever had an allergic reaction: (This would include a severe reaction [ e.g., anaphylaxis] that required treatment with epinephrine or EpiPen or that caused you to go to the hospital.  It would also include an allergic reaction that occurred within 4 hours that caused hives, swelling, or respiratory distress, including wheezing.) A.  A previous dose of COVID-19 vaccine. no  B.  A vaccine or injectable therapy that contains multiple components, one of which is a COVID-19 vaccine component, but it is not known which component elicited the immediate reaction. no  C.  Are you allergic to polyethylene glycol? no  D. Are  you allergic to Polysorbate, which is found in some vaccines, film coated tablets and intravenous steroids?  no   7.  Have you ever had an allergic reaction to another vaccine (other than COVID-19 vaccine) or an injectable medication? (This would include a severe reaction [ e.g., anaphylaxis] that required treatment with epinephrine or EpiPen or that caused you to go to the hospital.  It would also include an allergic reaction that occurred within 4 hours that caused hives, swelling, or respiratory distress, including wheezing.)  no   8.  Have you ever had a severe allergic reaction (e.g., anaphylaxis) to something other than a component of the COVID-19 vaccine, or any vaccine or injectable medication?  This would include food, pet, venom, environmental, or oral medication allergies.  no   Check all that apply to you:  Am a male between ages 74 and 23 years old  no  Women 25 through 52 years of age can receive any FDA-authorized or -approved COVID-19 vaccine. However, they should be informed of the rare but increased risk of thrombosis with thrombocytopenia syndrome (TTS) after receipt of the [employer] Vaccine and the availability of other FDA-authorized and -approved COVID-19 vaccines. People who had TTS after a first dose of Janssen vaccine should not receive a subsequent dose of Janssen product    Am a male between ages 12 and 62 years old  no Males 5 through 52 years of age may receive the correct formulation of Pfizer-BioNTech COVID-19 vaccine. Males 18 and older can receive any FDA-authorized or -approved vaccine. However, people receiving an mRNA COVID-19 vaccine, especially  males 95 through 52 years of age and their parents/legal representative (when relevant), should be informed of the risk of developing myocarditis (an inflammation of the heart muscle) or pericarditis (inflammation of the lining around the heart) after receipt of an mRNA vaccine. The risk of developing either  myocarditis or pericarditis after vaccination is low, and lower than the risk of myocarditis associated with SARS-CoV-2 infection in adolescents and adults. Vaccine recipients should be counseled about the need to seek care if symptoms of myocarditis or pericarditis develop after vaccination     Have a history of myocarditis or pericarditis  no Myocarditis or pericarditis after receipt of the first dose of an mRNA COVID-19 vaccine series but before administration of the second dose  Experts advise that people who develop myocarditis or pericarditis after a dose of an mRNA COVID-19 vaccine not receive a subsequent dose of any COVID-19 vaccine, until additional safety data are available.  Administration of a subsequent dose of COVID-19 vaccine before safety data are available can be considered in certain circumstances after the episode of myocarditis or pericarditis has completely resolved. Until additional data are available, some experts recommend a Alphonsa Overall COVID-19 vaccine be considered instead of an mRNA COVID-19 vaccine. Decisions about proceeding with a subsequent dose should include a conversation between the patient, their parent/legal representative (when relevant), and their clinical team, which may include a cardiologist.    Have been treated with monoclonal antibodies or convalescent serum to prevent or treat COVID-19  no Vaccination should be offered to people regardless of history of prior symptomatic or asymptomatic SARS-CoV-2 infection. There is no recommended minimal interval between infection and vaccination.  However, vaccination should be deferred if a patient received monoclonal antibodies or convalescent serum as treatment for COVID-19 or for post-exposure prophylaxis. This is a precautionary measure until additional information becomes available, to avoid interference of the antibody treatment with vaccine-induced immune responses.  Defer COVID-19 vaccination for 30 days when a  passive antibody product was used for post-exposure prophylaxis.  Defer COVID-19 vaccination for 90 days when a passive antibody product was used to treat COVID-19.     Diagnosed with Multisystem Inflammatory Syndrome (MIS-C or MIS-A) after a COVID-19 infection  no It is unknown if people with a history of MIS-C or MIS-A are at risk for a dysregulated immune response to COVID-19 vaccination.  People with a history of MIS-C or MIS-A may choose to be vaccinated. Considerations for vaccination may include:   Clinical recovery from MIS-C or MIS-A, including return to normal cardiac function   Personal risk of severe acute COVID-19 (e.g., age, underlying conditions)   High or substantial community transmission of SARS-CoV-2 and personal increased risk of reinfection.   Timing of any immunomodulatory therapies (general best practice guidelines for immunization can be consulted for more information [email or web address])   It has been 90 days or more since their diagnosis of MIS-C   Onset of MIS-C occurred before any COVID-19 vaccination   A conversation between the patient, their guardian(s), and their clinical team or a specialist may assist with COVID-19 vaccination decisions. Healthcare providers and health departments may also request a consultation from the [place] at [email or web address].     Have a bleeding disorder  no Take a blood thinner  no As with all vaccines, any COVID-19 vaccine product may be given to these patients, if a physician familiar with the patient's bleeding risk determines that the vaccine can be administered intramuscularly with reasonable safety.  ACIP  recommends the following technique for intramuscular vaccination in patients with bleeding disorders or taking blood thinners: a fine-gauge needle (23-gauge or smaller caliber) should be used  for the vaccination, followed by firm pressure on the site, without rubbing, for at least 2 minutes.  People who regularly take aspirin or anticoagulants as part of their routine medications do not need to stop these medications prior to receipt of any COVID-19 vaccine.    Have a history of heparin-induced thrombocytopenia (HIT)  no Although the etiology of TTS associated with the Alphonsa Overall COVID-19 vaccine is unclear, it appears to be similar to another rare immune-mediated syndrome, heparin-induced thrombocytopenia (HIT). People with a history of an episode of an immune-mediated syndrome characterized by thrombosis and thrombocytopenia, such as HIT, should be offered a currently FDA-approved or FDA-authorized mRNA COVID-19 vaccine if it has been ?90 days since their TTS resolved. After 90 days, patients may be vaccinated with any currently FDA-approved or FDA-authorized COVID-19 vaccine, including Janssen COVID-19 Vaccine. However, people who developed TTS after their initial Alphonsa Overall vaccine should not receive a Janssen booster dose.  Experts believe the following factors do not make people more susceptible to TTS after receipt of the [employer]. People with these conditions can be vaccinated with any FDA-authorized or - approved COVID-19 vaccine, including the [employer] COVID-19 Vaccine:   A prior history of venous thromboembolism   Risk factors for venous thromboembolism (e.g., inherited or acquired thrombophilia including Factor V Leiden; prothrombin gene 20210A mutation; antiphospholipid syndrome; protein C, protein S or antithrombin deficiency   A prior history of other types of thromboses not associated with thrombocytopenia   Pregnancy, post-partum status, or receipt of hormonal contraceptives (e.g., combined oral contraceptives, patch, ring)   Additional recipient education materials can be found at http://gutierrez-robinson.com/ vaccines/safety/JJUpdate.html.    Am currently  pregnant or breastfeeding  no Vaccination is recommended for all people aged 63 years and older, including people that are:   Pregnant   Breastfeeding   Trying to get pregnant now or who might become pregnant in the future   Pregnant, breastfeeding, and post-partum people 66 through 52 years of age should be aware of the rare risk of TTS after receipt of the Alphonsa Overall COVID-19 Vaccine and the availability of other FDA-authorized or -approved COVID-19 vaccines (i.e., mRNA vaccines).    Have received dermal fillers  no FDA-authorized or -approved COVID-19 vaccines can be administered to people who have received injectable dermal fillers who have no contraindications for vaccination.  Infrequently, these people might experience temporary swelling at or near the site of filler injection (usually the face or lips) following administration of a dose of an mRNA COVID-19 vaccine. These people should be advised to contact their healthcare provider if swelling develops at or near the site of dermal filler following vaccination.     Have a history of Guillain-Barr Syndrome (GBS)  no People with a history of GBS can receive any FDA-authorized or -approved COVID-19 vaccine. However, given the possible association between the [employer] and an increased risk of GBS, a patient with a history of GBS and their clinical team should discuss the availability of mRNA vaccines to offer protection against COVID-19. The highest risk has been observed in men aged 50-64 years with symptoms of GBS beginning within 42 days after Alphonsa Overall COVID-19 vaccination.  People who had GBS after receiving Janssen vaccine should be made aware of the option to receive an mRNA COVID-19 vaccine booster at least 2 months (8 weeks) after the  Janssen dose. However, [employer] vaccine may be used as a booster, particularly if GBS occurred more than 42 days after vaccination or was related to a non-vaccine factor. Prior to booster  vaccination, a conversation between the patient and their clinical team may assist with decisions about use of a COVID-19 booster dose, including the timing of administration     Postvaccination Observation Times for People without Contraindications to Covid 19 Vaccination.  30 minutes:  People with a history of: A contraindication to another type of COVID-19 vaccine product (i.e., mRNA or viral vector COVID-19 vaccines)   Immediate (within 4 hours of exposure) non-severe allergic reaction to a COVID-19 vaccine or injectable therapies   Anaphylaxis due to any cause   Immediate allergic reaction of any severity to a non-COVID-19 vaccine   15 minutes: All other people  This patient is a 50 y.o. male that meets the FDA criteria to receive homebound vaccination. Patient or parent/caregiver understands they have the option to accept or refuse homebound vaccination.  Patient passed the pre-screening checklist and would like to proceed with homebound vaccination.  Based on questionnaire above, I recommend the patient be observed for 15 minutes.  There are an estimated #1 other household members/caregivers who are also interested in receiving the vaccine.    The patient has been confirmed homebound and eligible for homebound vaccination with the considerations outlined above. I will send the patient's information to our scheduling team who will reach out to schedule the patient and potential caregiver/family members for homebound vaccination.    Skip Mayer 02/14/2021 10:23 AM

## 2021-02-15 ENCOUNTER — Other Ambulatory Visit: Payer: Self-pay | Admitting: Critical Care Medicine

## 2021-02-15 ENCOUNTER — Telehealth: Payer: Self-pay

## 2021-02-15 DIAGNOSIS — B1920 Unspecified viral hepatitis C without hepatic coma: Secondary | ICD-10-CM | POA: Insufficient documentation

## 2021-02-15 LAB — HCV RT-PCR, QUANT (NON-GRAPH)

## 2021-02-15 LAB — LIPID PANEL
Chol/HDL Ratio: 3.6 ratio (ref 0.0–5.0)
Cholesterol, Total: 141 mg/dL (ref 100–199)
HDL: 39 mg/dL — ABNORMAL LOW (ref >39)
LDL Chol Calc (NIH): 87 mg/dL (ref 0–99)
Triglycerides: 77 mg/dL (ref 0–149)
VLDL Cholesterol Cal: 15 mg/dL (ref 5–40)

## 2021-02-15 LAB — HCV AB W REFLEX TO QUANT PCR: HCV Ab: >11 s/co ratio — ABNORMAL HIGH (ref 0.0–0.9)

## 2021-02-15 NOTE — Telephone Encounter (Signed)
-----   Message from Storm Frisk, MD sent at 02/15/2021 11:43 AM EDT ----- Let pt know his hep C is Positive meaning he was exposed to hepatitis C virus.  I would like him to come back for another lab test for HEP C     his cholesterol is high,  follow a healthy diet for now until we see how his liver is doing.

## 2021-02-15 NOTE — Telephone Encounter (Signed)
Pt informed of lab results and to follow-up in office for retesting of hep C. Pt verbalized understanding and voiced no other concerns.

## 2021-02-16 ENCOUNTER — Other Ambulatory Visit: Payer: Self-pay

## 2021-02-16 ENCOUNTER — Ambulatory Visit: Payer: 59 | Attending: Critical Care Medicine

## 2021-02-16 ENCOUNTER — Ambulatory Visit: Payer: Self-pay

## 2021-02-16 ENCOUNTER — Ambulatory Visit (INDEPENDENT_AMBULATORY_CARE_PROVIDER_SITE_OTHER): Payer: 59 | Admitting: Orthopedic Surgery

## 2021-02-16 DIAGNOSIS — B1920 Unspecified viral hepatitis C without hepatic coma: Secondary | ICD-10-CM

## 2021-02-16 DIAGNOSIS — M25522 Pain in left elbow: Secondary | ICD-10-CM

## 2021-02-16 DIAGNOSIS — M545 Low back pain, unspecified: Secondary | ICD-10-CM | POA: Diagnosis not present

## 2021-02-16 DIAGNOSIS — I619 Nontraumatic intracerebral hemorrhage, unspecified: Secondary | ICD-10-CM

## 2021-02-17 ENCOUNTER — Encounter: Payer: Self-pay | Admitting: Orthopedic Surgery

## 2021-02-17 NOTE — Progress Notes (Signed)
Office Visit Note   Patient: Jason Romero           Date of Birth: 1971/03/01           MRN: [id number] Visit Date: 02/16/2021              Requested by: Storm Frisk, MD [address] [place],  [place] [address] PCP: Storm Frisk, MD  Chief Complaint  Patient presents with  . Right Leg - Pain  . Left Elbow - Pain  . Lower Back - Pain      HPI: Patient is a 50 year old gentleman who is seen for initial evaluation for left elbow pain and lower back pain.  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  Patient has aphasia and pain while trying to sleep at night patient is currently on Neurontin that patient states he is taking 5 300 mg tablets a day.  Patient is wheelchair-bound and is not ambulatory.  Patient's currently smoking 1 pack/day.  Assessment & Plan: Visit Diagnoses:  1. Pain in left elbow   2. Low back pain, unspecified back pain laterality, unspecified chronicity, unspecified whether sciatica present   3. Stroke, hemorrhagic [facility])     Plan: Discussed with the patient and his wife that there are not operative treatments for his symptoms.  A total elbow arthroplasty would not be a good option due to the fact he needs to use the elbow for body weight positioning and lifting.  Recommended pain management for the chronic pain symptoms.  Patient states that he cannot get a ride to pain management.  [facility] treatment.  Follow-Up Instructions: No follow-ups on file.   Ortho Exam  Patient is alert, oriented, no adenopathy, well-dressed, normal affect, normal respiratory effort. 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  Left leg has a negative straight leg raise and no focal motor weakness.   Patient has no radicular symptoms.  Examination of the left elbow patient has supination pronation of 45 degrees he has range of motion from 10 to 120 degrees.  He has full range of motion of all digits but patient states he does have some pain occasionally in the little long and ring finger.  Patient states he has had no relief with Voltaren gel.  There is no locking with range of motion of the elbow.  Imaging: No results found. No images are attached to the encounter.  Labs: Lab Results  Component Value Date   HGBA1C 5.6 09/12/2020   HGBA1C 5.7 02/07/2016   ESRSEDRATE 20 03/22/2016   CRP 0.4 (L) 03/22/2016   REPTSTATUS 05/29/2015 FINAL 05/25/2015   GRAMSTAIN  05/25/2015    MODERATE WBC PRESENT,BOTH PMN AND MONONUCLEAR NO ORGANISMS SEEN    CULT NO GROWTH 3 DAYS 05/25/2015   LABORGA NO GROWTH 03/22/2016     Lab Results  Component Value Date   ALBUMIN 4.4 02/16/2021   ALBUMIN 4.2 11/09/2020   ALBUMIN 3.1 (L) 09/21/2020    Lab Results  Component Value Date   MG 2.3 09/19/2020   MG 2.4 09/16/2020   MG 1.9 09/13/2020   No results found for: VD25OH  No results found for: PREALBUMIN CBC EXTENDED Latest Ref Rng & Units 11/09/2020 10/07/2020 09/21/2020  WBC 3.4 -  10.8 x10E3/uL 8.4 8.9 9.3  RBC 4.14 - 5.80 x10E6/uL 4.26 4.13(L) 4.71  HGB 13.0 - 17.7 g/dL 11.9(L) 11.6(L) 13.2  HCT 37.5 - 51.0 % 35.8(L) 36.2(L) 41.5  PLT 150 - 450 x10E3/uL 391 348 271  NEUTROABS 1.4 - 7.0 x10E3/uL 5.5 5.7 6.4  LYMPHSABS 0.7 - 3.1 x10E3/uL 1.7 1.9 1.6     There is no height or weight on file to calculate BMI.  Orders:  Orders Placed This Encounter  Procedures  . XR Elbow Complete Left (3+View)  . XR Lumbar Spine 2-3 Views   No orders of the defined types were placed in this encounter.    Procedures: No procedures performed  Clinical Data: No additional findings.  ROS:  All other systems negative, except as noted in the HPI. Review of Systems  Objective: Vital Signs: There  were no vitals taken for this visit.  Specialty Comments:  No specialty comments available.  PMFS History: Patient Active Problem List   Diagnosis Date Noted  . Hepatitis C test positive 02/15/2021  . 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 Hyperlipidemia 02/14/2021  . Visual changes 02/14/2021  . Pain of right lower extremity 02/14/2021  . Obesity 02/12/2021  . Back pain 02/12/2021  . Depression 01/26/2021  . Left elbow pain 01/26/2021  . Labile blood pressure   . Acute blood loss anemia   . Spastic hemiplegia affecting nondominant side (HCC)   . Dysphagia, post-stroke   . Dysphagia due to recent stroke 09/20/2020  . ICH (intracerebral hemorrhage) (HCC) 09/20/2020  . Stroke, hemorrhagic (HCC) 09/11/2020  . Sleep apnea 09/06/2016  . Tobacco abuse 09/06/2016  . 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 Lightheadedness 03/22/2016  . Unintentional weight loss 03/22/2016  . Essential hypertension 02/12/2016  . Anxiety 02/12/2016  . Migraines 02/12/2016  . Syncope 02/12/2016  . COPD (chronic obstructive pulmonary disease) (HCC) 02/01/2016  . Gastroesophageal reflux disease without esophagitis 02/01/2016  . PAF (paroxysmal atrial fibrillation) (HCC) 02/01/2016   Past Medical History:  Diagnosis Date  . A-fib (HCC)   . Chronic headaches   . COPD (chronic obstructive pulmonary disease) (HCC)   . GERD (gastroesophageal reflux disease)   . History of blood transfusion   . Hypertension   . MI (myocardial infarction) (HCC)   . Murmur 02/12/2021  . Seizures (HCC)     Family History  Problem Relation Age of Onset  . Alcoholism Other   . Arthritis Other   . Breast cancer Other   . Heart disease Mother     Past Surgical History:  Procedure Laterality Date  . CHOLECYSTECTOMY     Social History   Occupational History  . Not on file  Tobacco Use  . Smoking status: Current Every Day Smoker    Packs/day: 1.00  . Smokeless tobacco: Never Used   Substance and Sexual Activity  . Alcohol use: Yes    Alcohol/week: 0.0 standard drinks  . Drug use: Yes    Types: Marijuana  . Sexual activity: Not Currently

## 2021-02-18 LAB — HEPATIC FUNCTION PANEL
ALT: 15 IU/L (ref 0–44)
AST: 11 IU/L (ref 0–40)
Albumin: 4.4 g/dL (ref 3.8–4.9)
Alkaline Phosphatase: 82 IU/L (ref 44–121)
Bilirubin Total: 0.2 mg/dL (ref 0.0–1.2)
Bilirubin, Direct: <0.1 mg/dL (ref 0.00–0.40)
Total Protein: 7 g/dL (ref 6.0–8.5)

## 2021-02-18 LAB — HCV RNA QUANT
HCV log10: 5.723 log10 IU/mL
Hepatitis C Quantitation: 529000 IU/mL

## 2021-02-20 ENCOUNTER — Other Ambulatory Visit: Payer: Self-pay | Admitting: Critical Care Medicine

## 2021-02-20 DIAGNOSIS — B1920 Unspecified viral hepatitis C without hepatic coma: Secondary | ICD-10-CM

## 2021-02-23 ENCOUNTER — Encounter: Payer: Self-pay | Admitting: Neurology

## 2021-02-27 ENCOUNTER — Telehealth: Payer: Self-pay

## 2021-02-27 ENCOUNTER — Other Ambulatory Visit (HOSPITAL_COMMUNITY): Payer: Self-pay

## 2021-02-27 NOTE — Telephone Encounter (Signed)
[facility] Patient Product/process development scientist completed.    The patient is insured through [facility] & [employer].  Medication will need a PA   We will continue to follow to see if copay assistance is needed.  Clearance Coots, CPhT Specialty Pharmacy Patient [facility] Phone: [phone] Fax:  [phone]

## 2021-02-28 ENCOUNTER — Ambulatory Visit (INDEPENDENT_AMBULATORY_CARE_PROVIDER_SITE_OTHER): Payer: 59 | Admitting: Family

## 2021-02-28 ENCOUNTER — Other Ambulatory Visit: Payer: Self-pay

## 2021-02-28 ENCOUNTER — Encounter: Payer: Self-pay | Admitting: Family

## 2021-02-28 VITALS — BP 133/88 | HR 86 | Temp 97.5°F

## 2021-02-28 DIAGNOSIS — B182 Chronic viral hepatitis C: Secondary | ICD-10-CM | POA: Diagnosis not present

## 2021-02-28 MED FILL — Lisinopril Tab 20 MG: ORAL | 30 days supply | Qty: 60 | Fill #0 | Status: AC

## 2021-02-28 MED FILL — Gabapentin Cap 300 MG: ORAL | 30 days supply | Qty: 120 | Fill #0 | Status: AC

## 2021-02-28 NOTE — Patient Instructions (Signed)
Nice to meet you.   We will check your lab work today.   Limit acetaminophen (Tylenol) usage to no more than 2 grams (2,000 mg) per day.  Avoid alcohol.  Do not share toothbrushes or razors.  Practice safe sex to protect against transmission as well as sexually transmitted disease.    Hepatitis C Hepatitis C is a viral infection of the liver. It can lead to scarring of the liver (cirrhosis), liver failure, or liver cancer. Hepatitis C may go undetected for months or years because people with the infection may not have symptoms, or they may have only mild symptoms. What are the causes? This condition is caused by the hepatitis C virus (HCV). The virus can spread from person to person (is contagious) through: Blood. Childbirth. A woman who has hepatitis C can pass it to her baby during birth. Bodily fluids, such as breast milk, tears, semen, vaginal fluids, and saliva. Blood transfusions or organ transplants done in the [place] before 1992.  What increases the risk? The following factors may make you more likely to develop this condition: Having contact with unclean (contaminated) needles or syringes. This may result from: Acupuncture. Tattoing. Body piercing. Injecting drugs. Having unprotected sex with someone who is infected. Needing treatment to filter your blood (kidney dialysis). Having HIV (human immunodeficiency virus) or AIDS (acquired immunodeficiency syndrome). Working in a job that involves contact with blood or bodily fluids, such as health care.  What are the signs or symptoms? Symptoms of this condition include: Fatigue. Loss of appetite. Nausea. Vomiting. Abdominal pain. Dark yellow urine. Yellowish skin and eyes (jaundice). Itchy skin. Clay-colored bowel movements. Joint pain. Bleeding and bruising easily. Fluid building up in your stomach (ascites).  In some cases, you may not have any symptoms. How is this diagnosed? This condition is  diagnosed with: Blood tests. Other tests to check how well your liver is functioning. They may include: Magnetic resonance elastography (MRE). This imaging test uses MRIs and sound waves to measure liver stiffness. Transient elastography. This imaging test uses ultrasounds to measure liver stiffness. Liver biopsy. This test requires taking a small tissue sample from your liver to examine it under a microscope.  How is this treated? Your health care provider may perform noninvasive tests or a liver biopsy to help decide the best course of treatment. Treatment may include: Antiviral medicines and other medicines. Follow-up treatments every 6-12 months for infections or other liver conditions. Receiving a donated liver (liver transplant).  Follow these instructions at home: Medicines Take over-the-counter and prescription medicines only as told by your health care provider. Take your antiviral medicine as told by your health care provider. Do not stop taking the antiviral even if you start to feel better. Do not take any medicines unless approved by your health care provider, including over-the-counter medicines and birth control pills. Activity Rest as needed. Do not have sex unless approved by your health care provider. Ask your health care provider when you may return to school or work. Eating and drinking Eat a balanced diet with plenty of fruits and vegetables, whole grains, and lowfat (lean) meats or non-meat proteins (such as beans or tofu). Drink enough fluids to keep your urine clear or pale yellow. Do not drink alcohol. General instructions Do not share toothbrushes, nail clippers, or razors. Wash your hands frequently with soap and water. If soap and water are not available, use hand sanitizer. Cover any cuts or open sores on your skin to prevent spreading the virus.   Keep all follow-up visits as told by your health care provider. This is important. You may need follow-up visits  every 6-12 months. How is this prevented? There is no vaccine for hepatitis C. The only way to prevent the disease is to reduce the risk of exposure to the virus. Make sure you: Wash your hands frequently with soap and water. If soap and water are not available, use hand sanitizer. Do not share needles or syringes. Practice safe sex and use condoms. Avoid handling blood or bodily fluids without gloves or other protection. Avoid getting tattoos or piercings in shops or other locations that are not clean.  Contact a health care provider if: You have a fever. You develop abdominal pain. You pass dark urine. You pass clay-colored stools. You develop joint pain. Get help right away if: You have increasing fatigue or weakness. You lose your appetite. You cannot eat or drink without vomiting. You develop jaundice or your jaundice gets worse. You bruise or bleed easily. Summary Hepatitis C is a viral infection of the liver. It can lead to scarring of the liver (cirrhosis), liver failure, or liver cancer. The hepatitis C virus (HCV) causes this condition. The virus can pass from person to person (is contagious). You should not take any medicines unless approved by your health care provider. This includes over-the-counter medicines and birth control pills. This information is not intended to replace advice given to you by your health care provider. Make sure you discuss any questions you have with your health care provider. Document Released: 10/26/2000 Document Revised: 12/04/2016 Document Reviewed: 12/04/2016 Elsevier Interactive Patient Education  2018 Elsevier Inc.  

## 2021-02-28 NOTE — Assessment & Plan Note (Signed)
Jason Romero has Chronic Hepatitis C the with initial RNA level of 529,000. Risk factor for Hepatitis C is tattoo although may be possible other source. He is asymptomatic and treatment naive. Discussed the pathogenesis, risks if left untreated, and treatment options for Hepatitis C. Check Genotype, hepatic panel, HIV, Fibrosis score, and Hepatitis B status. Per pharmacy will need prior authorization for medication to be covered.  Will likely need to change cholesterol medication to rosuvastatin 10 mg during treatment due to interactions. Further treatment plan pending lab work results.

## 2021-02-28 NOTE — Progress Notes (Signed)
Subjective:    Patient ID: Jason Romero, male    DOB: [birth year], 50 y.o.   MRN: [id number]  Chief Complaint  Patient presents with  . New Patient (Initial Visit)    Hep C  . Hepatitis C    HPI:  Jason Romero is a 50 y.o. male with medical history of sleep apnea, paroxysmal atrial fibrillation, essential hypertension, COPD, and CVA presenting today for initial evaluation and treatment of hepatitis C.  Jason Romero was seen by his PCP office on 4/4 to establish care and during routine screening was noted to have a positive Hepatitis C antibody. Follow up viral load was 529,000. This was his initial diagnosis of Hepatitis C. Risk factors include a tattoo. Denies blood transfusions prior to 1992, sharing of razors/toothbrushes, IV drug use, or sexual contact with known positive partner. No personal or family history of liver disease. Has not received treatment to date and has no current symptoms including abdominal pain, nausea, vomiting, scleral icterus or jaundice. Currently uses marijuana on occasion with on average 1 pack of cigarettes per day and no alcohol consumption.     Allergies  Allergen Reactions  . Hydrocodone Hives  . Ibuprofen Other (See Comments)    Pt can't take this medication because it interacts with other medications that he is taking.       Outpatient Medications Prior to Visit  Medication Sig Dispense Refill  . acetaminophen (TYLENOL) 325 MG tablet Take 2 tablets (650 mg total) by mouth every 4 (four) hours as needed for mild pain (or temp > 37.5 C (99.5 F)).    Marland Kitchen amLODipine (NORVASC) 10 MG tablet TAKE 1 TABLET (10 MG TOTAL) BY MOUTH DAILY. 30 tablet 3  . atorvastatin (LIPITOR) 20 MG tablet Take 1 tablet (20 mg total) by mouth daily. 60 tablet 1  . gabapentin (NEURONTIN) 300 MG capsule TAKE 1 CAPSULE AT 8 AM AND 4 PM AND 2 CAPSULES AT BEDTIME 120 capsule 3  . hydrALAZINE (APRESOLINE) 50 MG tablet TAKE 1 TABLET (50 MG TOTAL) BY MOUTH 3 (THREE) TIMES DAILY. 90  tablet 2  . labetalol (NORMODYNE) 300 MG tablet TAKE 1 TABLET (300 MG TOTAL) BY MOUTH EVERY 8 (EIGHT) HOURS. 90 tablet 3  . lisinopril (ZESTRIL) 20 MG tablet TAKE 1 TABLET (20 MG TOTAL) BY MOUTH 2 (TWO) TIMES DAILY. 60 tablet 3  . nicotine polacrilex (NICORETTE) 2 MG lozenge Take 1 lozenge (2 mg total) by mouth as needed for smoking cessation. 100 lozenge 0  . pantoprazole (PROTONIX) 40 MG tablet TAKE 1 TABLET (40 MG TOTAL) BY MOUTH DAILY. 30 tablet 2  . senna (SENOKOT) 8.6 MG TABS tablet Take 1 tablet (8.6 mg total) by mouth 2 (two) times daily. 120 tablet 0  . sertraline (ZOLOFT) 50 MG tablet Take 1 tablet (50 mg total) by mouth daily. 30 tablet 3  . traZODone (DESYREL) 50 MG tablet Take 0.5-1 tablets (25-50 mg total) by mouth at bedtime as needed for sleep. 30 tablet 1   No facility-administered medications prior to visit.     Past Medical History:  Diagnosis Date  . A-fib (HCC)   . Chronic headaches   . COPD (chronic obstructive pulmonary disease) (HCC)   . GERD (gastroesophageal reflux disease)   . History of blood transfusion   . Hypertension   . MI (myocardial infarction) (HCC)   . Murmur 02/12/2021  . Seizures (HCC)       Past Surgical History:  Procedure Laterality Date  .  CHOLECYSTECTOMY        Family History  Problem Relation Age of Onset  . Alcoholism Other   . Arthritis Other   . Breast cancer Other   . Heart disease Mother       Social History   Socioeconomic History  . Marital status: Married    Spouse name: Not on file  . Number of children: Not on file  . Years of education: Not on file  . Highest education level: Not on file  Occupational History  . Not on file  Tobacco Use  . Smoking status: Current Every Day Smoker    Packs/day: 1.00  . Smokeless tobacco: Never Used  Vaping Use  . Vaping Use: Never used  Substance and Sexual Activity  . Alcohol use: Not Currently    Alcohol/week: 0.0 standard drinks  . Drug use: Yes    Types:  Marijuana    Comment: OCC  . Sexual activity: Not Currently  Other Topics Concern  . Not on file  Social History Narrative  . Not on file   Social Determinants of Health   Financial Resource Strain: Not on file  Food Insecurity: Not on file  Transportation Needs: Not on file  Physical Activity: Not on file  Stress: Not on file  Social Connections: Not on file  Intimate Partner Violence: Not on file      Review of Systems  Constitutional: Negative for chills, diaphoresis, fatigue and fever.  Respiratory: Negative for cough, chest tightness, shortness of breath and wheezing.   Cardiovascular: Negative for chest pain.  Gastrointestinal: Negative for abdominal distention, abdominal pain, constipation, diarrhea, nausea and vomiting.  Neurological: Negative for weakness and headaches.  Hematological: Does not bruise/bleed easily.       Objective:    BP 133/88   Pulse 86   Temp (!) 97.5 F (36.4 C) (Oral)  Nursing note and vital signs reviewed.  Physical Exam Constitutional:      General: He is not in acute distress.    Appearance: He is well-developed.     Comments: Seated in wheelchair; pleasant  Cardiovascular:     Rate and Rhythm: Normal rate and regular rhythm.     Heart sounds: Normal heart sounds. No murmur heard. No friction rub. No gallop.   Pulmonary:     Effort: Pulmonary effort is normal. No respiratory distress.     Breath sounds: Normal breath sounds. No wheezing or rales.  Chest:     Chest wall: No tenderness.  Abdominal:     General: Bowel sounds are normal. There is no distension.     Palpations: Abdomen is soft. There is no mass.     Tenderness: There is no abdominal tenderness. There is no guarding or rebound.  Skin:    General: Skin is warm and dry.  Neurological:     Mental Status: He is alert and oriented to person, place, and time.     Comments: Right sided hemiparesis.   Psychiatric:        Mood and Affect: Mood normal.          Assessment & Plan:   Patient Active Problem List   Diagnosis Date Noted  . Chronic hepatitis C without hepatic coma (HCC) 02/28/2021  . 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 Hyperlipidemia 02/14/2021  . Visual changes 02/14/2021  . Pain of right lower extremity 02/14/2021  . Obesity 02/12/2021  . Back pain 02/12/2021  . Depression 01/26/2021  .  Left elbow pain 01/26/2021  . Labile blood pressure   . Acute blood loss anemia   . Spastic hemiplegia affecting nondominant side (HCC)   . Dysphagia, post-stroke   . Dysphagia due to recent stroke 09/20/2020  . ICH (intracerebral hemorrhage) (HCC) 09/20/2020  . Stroke, hemorrhagic (HCC) 09/11/2020  . Sleep apnea 09/06/2016  . Tobacco abuse 09/06/2016  . 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 Lightheadedness 03/22/2016  . Unintentional weight loss 03/22/2016  . Essential hypertension 02/12/2016  . Anxiety 02/12/2016  . Migraines 02/12/2016  . Syncope 02/12/2016  . COPD (chronic obstructive pulmonary disease) (HCC) 02/01/2016  . Gastroesophageal reflux disease without esophagitis 02/01/2016  . PAF (paroxysmal atrial fibrillation) (HCC) 02/01/2016     Problem List Items Addressed This Visit      Digestive   Chronic hepatitis C without hepatic coma (HCC) - Primary    Mr. Karner has Chronic Hepatitis C the with initial RNA level of 529,000. Risk factor for Hepatitis C is tattoo although may be possible other source. He is asymptomatic and treatment naive. Discussed the pathogenesis, risks if left untreated, and treatment options for Hepatitis C. Check Genotype, hepatic panel, HIV, Fibrosis score, and Hepatitis B status. Per pharmacy will need prior authorization for medication to be covered.  Will likely need to change cholesterol medication to rosuvastatin 10 mg during treatment due to interactions. Further treatment plan pending lab work results.       Relevant Orders   Hepatitis C genotype    Liver Fibrosis, FibroTest-ActiTest   Protime-INR   Hepatic function panel   CBC   Hepatitis B surface antibody,qualitative   Hepatitis B surface antigen   HIV Antibody (routine testing w rflx)       I am having Telford H. Wheat maintain his acetaminophen, senna, gabapentin, hydrALAZINE, pantoprazole, lisinopril, labetalol, amLODipine, atorvastatin, nicotine polacrilex, traZODone, and sertraline.   Follow-up: One month after start of medication.     Marcos Eke, MSN, FNP-C Nurse Practitioner [facility] [address] for Infectious Disease [facility] Main number: [phone]

## 2021-03-01 ENCOUNTER — Ambulatory Visit: Payer: 59 | Attending: Critical Care Medicine

## 2021-03-01 DIAGNOSIS — Z23 Encounter for immunization: Secondary | ICD-10-CM

## 2021-03-01 NOTE — Progress Notes (Signed)
   Covid-19 Vaccination Clinic  Name:  Jason Romero    MRN: [id number] DOB: 05/17/1971  03/01/2021  [name] was observed post Covid-19 immunization for 15 minutes without incident. He was provided with Vaccine Information Sheet and instruction to access the V-Safe system.   [name] was instructed to call 911 with any severe reactions post vaccine: Marland Kitchen Difficulty breathing  . Swelling of face and throat  . A fast heartbeat  . A bad rash all over body  . Dizziness and weakness   Immunizations Administered    Name Date Dose VIS Date Route   PFIZER Comrnaty(Gray TOP) Covid-19 Vaccine 03/01/2021 11:17 AM 0.3 mL 10/20/2020 Intramuscular   Manufacturer: [employer], [employer]   Lot: [id number]   NDC: [phone]

## 2021-03-04 LAB — LIVER FIBROSIS, FIBROTEST-ACTITEST
ALT: 14 U/L (ref 9–46)
Alpha-2-Macroglobulin: 258 mg/dL (ref 106–279)
Apolipoprotein A1: 118 mg/dL (ref 94–176)
Bilirubin: 0.5 mg/dL (ref 0.2–1.2)
Fibrosis Score: 0.23
GGT: 15 U/L (ref 3–95)
Haptoglobin: 308 mg/dL — ABNORMAL HIGH (ref 43–212)
Necroinflammat ACT Score: 0.04
Reference ID: 3832859

## 2021-03-04 LAB — HEPATITIS C GENOTYPE

## 2021-03-04 LAB — HIV ANTIBODY (ROUTINE TESTING W REFLEX): HIV 1&2 Ab, 4th Generation: NONREACTIVE

## 2021-03-04 LAB — PROTIME-INR
INR: 1
Prothrombin Time: 10.4 s (ref 9.0–11.5)

## 2021-03-04 LAB — HEPATIC FUNCTION PANEL
AG Ratio: 1.6 (calc) (ref 1.0–2.5)
ALT: 14 U/L (ref 9–46)
AST: 14 U/L (ref 10–35)
Albumin: 4.5 g/dL (ref 3.6–5.1)
Alkaline phosphatase (APISO): 76 U/L (ref 35–144)
Bilirubin, Direct: 0.1 mg/dL (ref 0.0–0.2)
Globulin: 2.9 g/dL (calc) (ref 1.9–3.7)
Indirect Bilirubin: 0.4 mg/dL (calc) (ref 0.2–1.2)
Total Bilirubin: 0.5 mg/dL (ref 0.2–1.2)
Total Protein: 7.4 g/dL (ref 6.1–8.1)

## 2021-03-04 LAB — CBC
HCT: 41.3 % (ref 38.5–50.0)
Hemoglobin: 13.1 g/dL — ABNORMAL LOW (ref 13.2–17.1)
MCH: 27.6 pg (ref 27.0–33.0)
MCHC: 31.7 g/dL — ABNORMAL LOW (ref 32.0–36.0)
MCV: 87.1 fL (ref 80.0–100.0)
MPV: 9.2 fL (ref 7.5–12.5)
Platelets: 362 10*3/uL (ref 140–400)
RBC: 4.74 10*6/uL (ref 4.20–5.80)
RDW: 14.9 % (ref 11.0–15.0)
WBC: 8.2 10*3/uL (ref 3.8–10.8)

## 2021-03-04 LAB — HEPATITIS B SURFACE ANTIBODY,QUALITATIVE: Hep B S Ab: NONREACTIVE

## 2021-03-04 LAB — HEPATITIS B SURFACE ANTIGEN: Hepatitis B Surface Ag: NONREACTIVE

## 2021-03-10 ENCOUNTER — Telehealth: Payer: Self-pay | Admitting: Family

## 2021-03-10 NOTE — Telephone Encounter (Signed)
Spoke with Jason Romero and his wife that he has Genotype 1a chronic hepatitis C with fibrosis score of F0-F1. Will go ahead with Mavyret for 8 weeks.   Marcos Eke, NP 03/10/2021 3:05 PM

## 2021-03-10 NOTE — Telephone Encounter (Signed)
Will need change Atorvastatin to Rosuvastain 10mg  during treatment.   , NP 03/10/2021 3:07 PM

## 2021-03-13 ENCOUNTER — Other Ambulatory Visit: Payer: Self-pay

## 2021-03-13 ENCOUNTER — Telehealth: Payer: Self-pay

## 2021-03-13 ENCOUNTER — Other Ambulatory Visit (HOSPITAL_COMMUNITY): Payer: Self-pay

## 2021-03-13 ENCOUNTER — Other Ambulatory Visit: Payer: Self-pay | Admitting: Physician Assistant

## 2021-03-13 DIAGNOSIS — I10 Essential (primary) hypertension: Secondary | ICD-10-CM

## 2021-03-13 DIAGNOSIS — I619 Nontraumatic intracerebral hemorrhage, unspecified: Secondary | ICD-10-CM

## 2021-03-13 MED ORDER — LABETALOL HCL 300 MG PO TABS
ORAL_TABLET | ORAL | 0 refills | Status: DC
  Filled 2021-03-13: qty 30, 10d supply, fill #0

## 2021-03-13 MED ORDER — HYDRALAZINE HCL 50 MG PO TABS
ORAL_TABLET | Freq: Three times a day (TID) | ORAL | 0 refills | Status: DC
  Filled 2021-03-13: qty 90, 30d supply, fill #0

## 2021-03-13 MED ORDER — AMLODIPINE BESYLATE 10 MG PO TABS
ORAL_TABLET | Freq: Every day | ORAL | 0 refills | Status: DC
  Filled 2021-03-13: qty 30, 30d supply, fill #0

## 2021-03-13 NOTE — Telephone Encounter (Signed)
[facility] Patient Advocate Encounter   Received notification from [facility] that prior authorization for Mavyret is required.   PA submitted on 03/13/21 Key [id number] Status is pending    [facility] Clinic will continue to follow.   Clearance Coots, CPhT Specialty Pharmacy Patient [facility] for Infectious Disease Phone: [phone] Fax:  [phone]

## 2021-03-15 ENCOUNTER — Telehealth: Payer: Self-pay

## 2021-03-15 ENCOUNTER — Other Ambulatory Visit (HOSPITAL_COMMUNITY): Payer: Self-pay

## 2021-03-15 NOTE — Telephone Encounter (Signed)
[facility] Patient Advocate Encounter   Received notification from [facility] that prior authorization for Jason Romero is required.   PA submitted on 03/15/21 Key [id number] Status is pending    [facility] Clinic will continue to follow.   Jason Romero, CPhT Specialty Pharmacy Patient [facility] for Infectious Disease Phone: [phone] Fax:  [phone]

## 2021-03-17 ENCOUNTER — Telehealth: Payer: Self-pay

## 2021-03-17 ENCOUNTER — Other Ambulatory Visit (HOSPITAL_COMMUNITY): Payer: Self-pay

## 2021-03-17 NOTE — Telephone Encounter (Signed)
[facility] Patient Advocate Encounter  Prior Authorization for Dorita Fray has been approved.    PA# [id number] Effective dates: 03/15/21 through 06/07/21  Prescription will need to be filled with [facility].  PA was approved for Brand Name Epclusa. I will contact pharmacy once script is sent over to see how much is the copay. I will contact patient to get a delivery date and to make a 1 month follow up appointment.  [facility] Clinic will continue to follow.  Clearance Coots, CPhT Specialty Pharmacy Patient [facility] for Infectious Disease Phone: [phone] Fax:  [phone]

## 2021-03-21 ENCOUNTER — Other Ambulatory Visit: Payer: Self-pay

## 2021-03-23 ENCOUNTER — Other Ambulatory Visit: Payer: Self-pay | Admitting: Pharmacist

## 2021-03-23 DIAGNOSIS — B182 Chronic viral hepatitis C: Secondary | ICD-10-CM

## 2021-03-23 MED ORDER — SOFOSBUVIR-VELPATASVIR 400-100 MG PO TABS: 1.0000 | ORAL_TABLET | Freq: Every day | ORAL | 2 refills | Status: DC

## 2021-03-23 NOTE — Progress Notes (Unsigned)
Patient is approved for Epclusa. Sending to [facility] per patient's insurance.   Patient possibly taking Protonix. Will counsel on administration with Epclusa when discussing with patient.

## 2021-03-27 ENCOUNTER — Other Ambulatory Visit: Payer: Self-pay

## 2021-03-27 ENCOUNTER — Other Ambulatory Visit: Payer: Self-pay | Admitting: Critical Care Medicine

## 2021-03-27 ENCOUNTER — Telehealth: Payer: Self-pay | Admitting: Critical Care Medicine

## 2021-03-27 DIAGNOSIS — I619 Nontraumatic intracerebral hemorrhage, unspecified: Secondary | ICD-10-CM

## 2021-03-27 DIAGNOSIS — I610 Nontraumatic intracerebral hemorrhage in hemisphere, subcortical: Secondary | ICD-10-CM

## 2021-03-27 DIAGNOSIS — G8110 Spastic hemiplegia affecting unspecified side: Secondary | ICD-10-CM

## 2021-03-27 DIAGNOSIS — I69398 Other sequelae of cerebral infarction: Secondary | ICD-10-CM

## 2021-03-27 DIAGNOSIS — I10 Essential (primary) hypertension: Secondary | ICD-10-CM

## 2021-03-27 MED ORDER — LABETALOL HCL 300 MG PO TABS
ORAL_TABLET | ORAL | 0 refills | Status: DC
  Filled 2021-03-27: qty 30, 10d supply, fill #0

## 2021-03-27 MED FILL — Gabapentin Cap 300 MG: ORAL | 30 days supply | Qty: 120 | Fill #1 | Status: AC

## 2021-03-27 NOTE — Telephone Encounter (Signed)
Pt is requesting PT referral for stroke.

## 2021-03-27 NOTE — Telephone Encounter (Signed)
Referral for Physical Therapy for his right side due to Stroke. Pt is asking to be sent to  location [address] [facility]. Phone [phone]   Please advise and thank you.

## 2021-03-28 ENCOUNTER — Other Ambulatory Visit: Payer: Self-pay

## 2021-03-28 NOTE — Telephone Encounter (Signed)
ORDER for PT sent  pls let pt know

## 2021-04-05 ENCOUNTER — Other Ambulatory Visit: Payer: Self-pay

## 2021-04-05 ENCOUNTER — Other Ambulatory Visit: Payer: Self-pay | Admitting: Physician Assistant

## 2021-04-05 DIAGNOSIS — I10 Essential (primary) hypertension: Secondary | ICD-10-CM

## 2021-04-05 DIAGNOSIS — I619 Nontraumatic intracerebral hemorrhage, unspecified: Secondary | ICD-10-CM

## 2021-04-05 MED ORDER — LISINOPRIL 20 MG PO TABS
ORAL_TABLET | Freq: Two times a day (BID) | ORAL | 0 refills | Status: DC
  Filled 2021-04-05: qty 60, 30d supply, fill #0

## 2021-04-06 ENCOUNTER — Other Ambulatory Visit: Payer: Self-pay

## 2021-04-07 ENCOUNTER — Ambulatory Visit: Payer: Self-pay | Admitting: *Deleted

## 2021-04-07 ENCOUNTER — Other Ambulatory Visit: Payer: Self-pay | Admitting: Critical Care Medicine

## 2021-04-07 ENCOUNTER — Other Ambulatory Visit: Payer: Self-pay

## 2021-04-07 MED ORDER — TRAZODONE HCL 50 MG PO TABS
25.0000 mg | ORAL_TABLET | Freq: Every evening | ORAL | 1 refills | Status: DC | PRN
  Filled 2021-04-07: qty 30, 30d supply, fill #0
  Filled 2021-05-10: qty 30, 30d supply, fill #1

## 2021-04-07 NOTE — Telephone Encounter (Signed)
Summary: medication interaction    Patient's wife has concerns related to a possible interaction between patient's medications and would like to discuss with a member of nursing staff when available   Sofosbuvir-Velpatasvir (EPCLUSA) 400-100 MG TABS and atorvastatin (LIPITOR) 20 MG tablet Are the medications in question   Patient's wife shares that the patient is currently having no reaction but was alerted to the interaction after a recent encounter with another facility   Please contact to further advise when possible     Called patient's wife to review concerns and or symptoms regarding epclusa and liptior taking together. Patient's pharmacist contacted patient's wife to discuss possible side effects patient may have while taking epclus and lipitor. Patient's wife reports she has not noticed any muscle soreness or bleeding at this time. Patient's wife would like to know if PCP wants patient to continue medications or to stop lipitor. Patient has 4-5 more tablets of lipitor before needing refill. Care advise given. Patient's wife verbalized understanding of care advise and to call back if interaction symptoms noted. Please advise.   Reason for Disposition . [1] Caller requesting NON-URGENT health information AND [2] PCP's office is the best resource  Answer Assessment - Initial Assessment Questions 1. REASON FOR CALL or QUESTION: "What is your reason for calling today?" or "How can I best help you?" or "What question do you have that I can help answer?"     Patient's wife would like to know if PCP wants to change cholesterol medication due to pharmacy requesting her to notify PCP of possible drug interaction with epclusa.  Protocols used: INFORMATION ONLY CALL - NO TRIAGE-A-AH

## 2021-04-07 NOTE — Telephone Encounter (Signed)
Requested Prescriptions  Pending Prescriptions Disp Refills  . traZODone (DESYREL) 50 MG tablet 30 tablet 1    Sig: Take 0.5-1 tablets (25-50 mg total) by mouth at bedtime as needed for sleep.     Psychiatry: Antidepressants - Serotonin Modulator Passed - 04/07/2021 11:39 AM      Passed - Completed PHQ-2 or PHQ-9 in the last 360 days      Passed - Valid encounter within last 6 months    Recent Outpatient Visits          1 month ago 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)   [facility] Storm Frisk, MD   4 months ago Essential hypertension   [facility] [place], Marzella Schlein, [place]      Future Appointments            In 1 week [name] [name], MD [facility]   In 3 weeks K[place], Cherylann Ratel, RPH-CPP [facility] for Infectious Disease, [facility]   In 3 weeks Drema Dallas, DO [facility]

## 2021-04-09 ENCOUNTER — Telehealth: Payer: Self-pay | Admitting: Critical Care Medicine

## 2021-04-09 MED ORDER — ATORVASTATIN CALCIUM 20 MG PO TABS: ORAL_TABLET | ORAL | 1 refills | Status: DC

## 2021-04-09 NOTE — Telephone Encounter (Signed)
I spoke to the patient and told him to HOLD his atorvastatin until off [place]

## 2021-04-11 NOTE — Telephone Encounter (Signed)
Thank you sir!

## 2021-04-13 ENCOUNTER — Other Ambulatory Visit: Payer: Self-pay

## 2021-04-13 ENCOUNTER — Other Ambulatory Visit: Payer: Self-pay | Admitting: Critical Care Medicine

## 2021-04-13 DIAGNOSIS — I619 Nontraumatic intracerebral hemorrhage, unspecified: Secondary | ICD-10-CM

## 2021-04-13 DIAGNOSIS — I10 Essential (primary) hypertension: Secondary | ICD-10-CM

## 2021-04-13 MED ORDER — LABETALOL HCL 300 MG PO TABS
ORAL_TABLET | ORAL | 0 refills | Status: DC
  Filled 2021-04-13: qty 30, 10d supply, fill #0

## 2021-04-13 MED ORDER — HYDRALAZINE HCL 50 MG PO TABS
ORAL_TABLET | Freq: Three times a day (TID) | ORAL | 0 refills | Status: DC
  Filled 2021-04-13: qty 90, 30d supply, fill #0

## 2021-04-14 ENCOUNTER — Telehealth: Payer: Self-pay | Admitting: Critical Care Medicine

## 2021-04-14 DIAGNOSIS — H5213 Myopia, bilateral: Secondary | ICD-10-CM | POA: Diagnosis not present

## 2021-04-14 NOTE — Telephone Encounter (Signed)
This is managed by ID. Pharmacy should reach out to pt's ID provider.

## 2021-04-14 NOTE — Telephone Encounter (Signed)
Ainsley with [facility] specialty pharm is calling there is interaction with atorvastatin and hep c medication epclusa

## 2021-04-16 NOTE — Progress Notes (Deleted)
Established Patient Office Visit  Subjective:  Patient ID: Jason Romero, male    DOB: [birth year]  Age: 50 y.o. MRN: [id number]  CC:  No chief complaint on file.   HPI 02/13/21 Jason Romero  is a 50 y.o left-handed M here today to establish care. 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 The patient reports a burning sensation in the affected arm but denies spasticity in his legs. The patient reports minor relief of symptoms with gabapentin and an inability to sleep due to nerve pain. The patient can stand for a few seconds but is unable to walk and cannot use a walker. The patient denies urinary incontinence and reports utilizing a bedside toilet. The patient can swallow and eat without difficulties.   hosp records reviewed  The patient reports depressive moods but denies suicidal thoughts and ideation. At today's visit, the patient blood pressure is stable, and he denies headaches and dizziness but reports fogginess in his vision. The patient has not had an eye exam and verbalized the need for glasses. The patient smokes marijuana and reports that 1 pack of cigarettes lasts him 2 days.  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 The patient reports a burning sensation in the affected arm but denies spasticity in his legs. The patient reports minor relief of symptoms with gabapentin and an inability to sleep due to nerve pain.  Ambulatory referral to Neurology for nerve pain Home Health -     Face-to-face encounter (required for Medicare/Medicaid patients)  -     Lipid Panel -     atorvastatin (LIPITOR) 20 MG tablet; Take 1 tablet (20 mg total) by mouth daily.      Depression The patient reports depressive moods but denies suicidal thoughts and ideation. sertraline (ZOLOFT) 50 MG tablet; Take 1  tablet (50 mg total) by mouth daily.  Essential hypertension Stable BP today is 138/80 No changes to medications, continue treatment regimen.   Left elbow pain Ambulatory referral to Orthopedic Surgery  Tobacco abuse The patient smokes marijuana and reports that 1 pack of cigarettes lasts him 2 days. The patient is open to quitting. nicotine polacrilex (NICORETTE) 2 MG lozenge; Take 1 lozenge (2 mg total) by mouth as needed for smoking cessation.    . Current smoking consumption amount: 2 cigars every two days  . Dicsussion on advise to quit smoking and smoking impacts: CV imparcts  . Patient's willingness to quit:  yes  . Methods to quit smoking discussed:  Nicotine replace  . Medication management of smoking session drugs discussed:nicotine lozenge  . Resources provided:  AVS   . Setting quit date not est  . Follow-up arranged 2 mo   Time spent counseling the patient:  5 min   Insomnia The patient reports minor relief of symptoms with gabapentin and an inability to sleep due to nerve pain. -  traZODone (DESYREL) 50 MG tablet; Take 0.5-1 tablets (25-50 mg total) by mouth at bedtime as needed for sleep.  Visual changes The reports fogginess in his vision. The patient has not had an eye exam and verbalized the need for glasses.  -     Ambulatory referral to Ophthalmology  Encounter for hepatitis C screening test for low risk patient -     HCV Ab w/Rflx to Verification  Colon cancer screening -     Fecal occult blood, imunochemical    Problem List Items Addressed This Visit   None   Visit Diagnoses    Need for hepatitis C screening test    -  Primary   Relevant Orders   HCV Ab w Reflex to Quant PCR   Colon cancer screening       Relevant Orders   Fecal occult blood, imunochemical    Past Medical History:  Diagnosis Date  . A-fib (HCC)   . Chronic headaches   . COPD (chronic obstructive pulmonary disease) (HCC)   . GERD (gastroesophageal reflux  disease)   . History of blood transfusion   . Hypertension   . MI (myocardial infarction) (HCC)   . Murmur 02/12/2021  . Seizures (HCC)     Past Surgical History:  Procedure Laterality Date  . CHOLECYSTECTOMY      Family History  Problem Relation Age of Onset  . Alcoholism Other   . Arthritis Other   . Breast cancer Other   . Heart disease Mother     Social History   Socioeconomic History  . Marital status: Married    Spouse name: Not on file  . Number of children: Not on file  . Years of education: Not on file  . Highest education level: Not on file  Occupational History  . Not on file  Tobacco Use  . Smoking status: Current Every Day Smoker    Packs/day: 1.00  . Smokeless tobacco: Never Used  Vaping Use  . Vaping Use: Never used  Substance and Sexual Activity  . Alcohol use: Not Currently    Alcohol/week: 0.0 standard drinks  . Drug use: Yes    Types: Marijuana    Comment: OCC  . Sexual activity: Not Currently  Other Topics Concern  . Not on file  Social History Narrative  . Not on file   Social Determinants of Health   Financial Resource Strain: Not on file  Food Insecurity: Not on file  Transportation Needs: Not on file  Physical Activity: Not on file  Stress: Not on file  Social Connections: Not on file  Intimate Partner Violence: Not on file    Outpatient Medications Prior to Visit  Medication Sig Dispense Refill  . acetaminophen (TYLENOL) 325 MG tablet Take 2 tablets (650 mg total) by mouth every 4 (four) hours as needed for mild pain (or temp > 37.5 C (99.5 F)).    Marland Kitchen amLODipine (NORVASC) 10 MG tablet TAKE 1 TABLET (10 MG TOTAL) BY MOUTH DAILY. 30 tablet 0  . atorvastatin (LIPITOR) 20 MG tablet HOLD UNTIL OFF EPCLUSA 60 tablet 1  . gabapentin (NEURONTIN) 300 MG capsule TAKE 1 CAPSULE AT 8 AM AND 4 PM AND 2 CAPSULES AT BEDTIME 120 capsule 3  . hydrALAZINE (APRESOLINE) 50 MG tablet TAKE 1 TABLET (50 MG TOTAL) BY MOUTH 3 (THREE) TIMES DAILY. 90  tablet 0  . labetalol (NORMODYNE) 300 MG tablet TAKE 1 TABLET (300 MG TOTAL) BY MOUTH EVERY 8 (EIGHT) HOURS. 30 tablet 0  . lisinopril (ZESTRIL) 20 MG tablet TAKE 1 TABLET (20 MG TOTAL) BY MOUTH 2 (TWO) TIMES DAILY. 60 tablet 0  . nicotine polacrilex (NICORETTE) 2 MG lozenge Take 1 lozenge (2 mg total) by mouth as needed for smoking cessation. 100 lozenge 0  . pantoprazole (PROTONIX) 40 MG tablet TAKE 1 TABLET (40 MG TOTAL) BY MOUTH DAILY. 30 tablet 2  . senna (SENOKOT) 8.6 MG TABS tablet Take  1 tablet (8.6 mg total) by mouth 2 (two) times daily. 120 tablet 0  . sertraline (ZOLOFT) 50 MG tablet Take 1 tablet (50 mg total) by mouth daily. 30 tablet 3  . Sofosbuvir-Velpatasvir (EPCLUSA) 400-100 MG TABS Take 1 tablet by mouth daily. 28 tablet 2  . traZODone (DESYREL) 50 MG tablet Take 0.5-1 tablets (25-50 mg total) by mouth at bedtime as needed for sleep. 30 tablet 1   No facility-administered medications prior to visit.    Allergies  Allergen Reactions  . Hydrocodone Hives  . Ibuprofen Other (See Comments)    Pt can't take this medication because it interacts with other medications that he is taking.     ROS Review of Systems  Constitutional: Negative for fever.  HENT: Negative for rhinorrhea, sinus pain, sore throat and trouble swallowing.   Eyes: Positive for visual disturbance.  Respiratory: Negative for choking and shortness of breath.   Cardiovascular: Positive for leg swelling. Negative for chest pain.  Gastrointestinal: Negative for constipation and diarrhea.  Endocrine: Negative for polydipsia and polyphagia.  Genitourinary: Negative for difficulty urinating.  Musculoskeletal: Positive for myalgias. Negative for back pain.  Skin: Negative for pallor and rash.  Allergic/Immunologic: Negative.   Neurological: Negative for tremors, weakness, light-headedness and headaches.  Psychiatric/Behavioral: Positive for dysphoric mood and sleep disturbance. Negative for suicidal ideas.       Objective:    Physical Exam Constitutional:      Appearance: Normal appearance.  HENT:     Head: Normocephalic.     Nose: Nose normal. No congestion or rhinorrhea.     Mouth/Throat:     Mouth: Mucous membranes are moist.     Pharynx: No oropharyngeal exudate or posterior oropharyngeal erythema.  Eyes:     General:        Left eye: No discharge.  Cardiovascular:     Rate and Rhythm: Normal rate and regular rhythm.     Pulses: Normal pulses.     Heart sounds: Normal heart sounds.  Pulmonary:     Effort: Pulmonary effort is normal.     Breath sounds: Normal breath sounds.  Abdominal:     General: Bowel sounds are normal.  Genitourinary:    Penis: Normal.   Musculoskeletal:     Cervical back: Normal range of motion.     Right lower leg: No edema.     Left lower leg: No edema.     Comments: Decreased active ROM, muscle tone, and finger grasp in the right hand.   Skin:    General: Skin is warm and dry.  Neurological:     Mental Status: He is alert and oriented to person, place, and time.     Motor: Weakness present.     Gait: Gait abnormal.     Comments: right side hemiparesis  Psychiatric:        Thought Content: Thought content normal.    There were no vitals taken for this visit. Wt Readings from Last 3 Encounters:  10/19/20 227 lb 9.6 oz (103.2 kg)  09/20/20 240 lb 15.4 oz (109.3 kg)  03/11/17 228 lb (103.4 kg)     Health Maintenance Due  Topic Date Due  . Pneumococcal Vaccine 480-52 Years old (1 of 2 - PPSV23) Never done  . Zoster Vaccines- Shingrix (1 of 2) Never done    There are no preventive care reminders to display for this patient.  Lab Results  Component Value Date   TSH 0.64 03/22/2016   Lab Results  Component Value Date   WBC 8.2 02/28/2021   HGB 13.1 (L) 02/28/2021   HCT 41.3 02/28/2021   MCV 87.1 02/28/2021   PLT 362 02/28/2021   Lab Results  Component Value Date   NA 140 11/09/2020   K 4.1 11/09/2020   CO2 21 11/09/2020    GLUCOSE 89 11/09/2020   BUN 9 11/09/2020   CREATININE 0.93 11/09/2020   BILITOT 0.5 02/28/2021   ALKPHOS 82 02/16/2021   AST 14 02/28/2021   ALT 14 02/28/2021   ALT 14 02/28/2021   PROT 7.4 02/28/2021   ALBUMIN 4.4 02/16/2021   CALCIUM 9.5 11/09/2020   ANIONGAP 13 10/07/2020   GFR 70.92 02/27/2017   Lab Results  Component Value Date   CHOL 141 02/13/2021   Lab Results  Component Value Date   HDL 39 (L) 02/13/2021   Lab Results  Component Value Date   LDLCALC 87 02/13/2021   Lab Results  Component Value Date   TRIG 77 02/13/2021   Lab Results  Component Value Date   CHOLHDL 3.6 02/13/2021   Lab Results  Component Value Date   HGBA1C 5.6 09/12/2020      Assessment & Plan:  No problem-specific Assessment & Plan notes found for this encounter.    . Current smoking consumption amount: 2 cigars every two days  . Dicsussion on advise to quit smoking and smoking impacts: CV imparcts  . Patient's willingness to quit:  yes  . Methods to quit smoking discussed:  Nicotine replace  . Medication management of smoking session drugs discussed:nicotine lozenge  . Resources provided:  AVS   . Setting quit date not est  . Follow-up arranged 2 mo   Time spent counseling the patient:  5 min   Insomnia The patient reports minor relief of symptoms with gabapentin and an inability to sleep due to nerve pain. -  traZODone (DESYREL) 50 MG tablet; Take 0.5-1 tablets (25-50 mg total) by mouth at bedtime as needed for sleep.  Visual changes The reports fogginess in his vision. The patient has not had an eye exam and verbalized the need for glasses.  -     Ambulatory referral to Ophthalmology  Encounter for hepatitis C screening test for low risk patient -     HCV Ab w/Rflx to Verification  Colon cancer screening -     Fecal occult blood, imunochemical    Problem List Items Addressed This Visit   None     No orders of the defined types were placed in this  encounter.   Follow-up: in 2 months  This encounter took with complex MDM  I have seen and examined this patient with the mid-level provider and agree with the above note . Shan Levans

## 2021-04-17 ENCOUNTER — Ambulatory Visit: Payer: 59 | Admitting: Critical Care Medicine

## 2021-04-18 ENCOUNTER — Telehealth: Payer: Self-pay | Admitting: Critical Care Medicine

## 2021-04-18 ENCOUNTER — Other Ambulatory Visit: Payer: Self-pay

## 2021-04-18 MED FILL — Gabapentin Cap 300 MG: ORAL | 30 days supply | Qty: 120 | Fill #2 | Status: AC

## 2021-04-18 NOTE — Telephone Encounter (Signed)
Drug interaction between Atorvastatin and Hepatitis medication Please advise [place] calling from [facility]  Best contact: [phone]

## 2021-04-18 NOTE — Telephone Encounter (Signed)
Please route this message to his ID provider.

## 2021-04-19 NOTE — Telephone Encounter (Signed)
The plan was to change his atorvastatin to 10 mg of Rosuvastain which is okay with Epclusa. I believe he was instructed to do so.

## 2021-04-20 ENCOUNTER — Other Ambulatory Visit: Payer: Self-pay

## 2021-04-21 NOTE — Telephone Encounter (Signed)
I've been trying to reach patient to switch but neither Jason Romero nor I can reach him.

## 2021-04-28 ENCOUNTER — Ambulatory Visit (INDEPENDENT_AMBULATORY_CARE_PROVIDER_SITE_OTHER): Payer: 59 | Admitting: Pharmacist

## 2021-04-28 ENCOUNTER — Other Ambulatory Visit: Payer: Self-pay

## 2021-04-28 DIAGNOSIS — B182 Chronic viral hepatitis C: Secondary | ICD-10-CM

## 2021-04-28 NOTE — Progress Notes (Signed)
HPI: Jason Romero is a 50 y.o. male who presents to the [facility] pharmacy clinic for Hepatitis C follow-up.  Medication: Epclusa x 12 weeks  Start Date: 03/30/21  Hepatitis C Genotype: 1a  Fibrosis Score: F0/F1  Hepatitis C RNA: 529,000 on 02/16/21  Patient Active Problem List   Diagnosis Date Noted   Chronic hepatitis C without hepatic coma (HCC) 02/28/2021   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) 02/14/2021   Hyperlipidemia 02/14/2021   Visual changes 02/14/2021   Pain of right lower extremity 02/14/2021   Obesity 02/12/2021   Back pain 02/12/2021   Depression 01/26/2021   Left elbow pain 01/26/2021   Labile blood pressure    Acute blood loss anemia    Spastic hemiplegia affecting nondominant side (HCC)    Dysphagia, post-stroke    Dysphagia due to recent stroke 09/20/2020   ICH (intracerebral hemorrhage) (HCC) 09/20/2020   Stroke, hemorrhagic (HCC) 09/11/2020   Sleep apnea 09/06/2016   Tobacco abuse 09/06/2016   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   Lightheadedness 03/22/2016   Unintentional weight loss 03/22/2016   Essential hypertension 02/12/2016   Anxiety 02/12/2016   Migraines 02/12/2016   Syncope 02/12/2016   COPD (chronic obstructive pulmonary disease) (HCC) 02/01/2016   Gastroesophageal reflux disease without esophagitis 02/01/2016   PAF (paroxysmal atrial fibrillation) (HCC) 02/01/2016    Patient's Medications  New Prescriptions   No medications on file  Previous Medications   ACETAMINOPHEN (TYLENOL) 325 MG TABLET    Take 2 tablets (650 mg total) by mouth every 4 (four) hours as needed for mild pain (or temp > 37.5 C (99.5 F)).   AMLODIPINE (NORVASC) 10 MG TABLET    TAKE 1 TABLET (10 MG TOTAL) BY MOUTH DAILY.   ATORVASTATIN (LIPITOR) 20 MG TABLET    HOLD UNTIL OFF EPCLUSA   CITALOPRAM (CELEXA) 20 MG TABLET       GABAPENTIN (NEURONTIN) 300 MG CAPSULE    TAKE 1 CAPSULE AT 8 AM AND 4 PM AND 2 CAPSULES AT BEDTIME   HYDRALAZINE  (APRESOLINE) 50 MG TABLET    TAKE 1 TABLET (50 MG TOTAL) BY MOUTH 3 (THREE) TIMES DAILY.   LABETALOL (NORMODYNE) 300 MG TABLET    TAKE 1 TABLET (300 MG TOTAL) BY MOUTH EVERY 8 (EIGHT) HOURS.   LISINOPRIL (ZESTRIL) 20 MG TABLET    TAKE 1 TABLET (20 MG TOTAL) BY MOUTH 2 (TWO) TIMES DAILY.   NICOTINE POLACRILEX (NICORETTE) 2 MG LOZENGE    Take 1 lozenge (2 mg total) by mouth as needed for smoking cessation.   PANTOPRAZOLE (PROTONIX) 40 MG TABLET    TAKE 1 TABLET (40 MG TOTAL) BY MOUTH DAILY.   SENNA (SENOKOT) 8.6 MG TABS TABLET    Take 1 tablet (8.6 mg total) by mouth 2 (two) times daily.   SERTRALINE (ZOLOFT) 50 MG TABLET    Take 1 tablet (50 mg total) by mouth daily.   SOFOSBUVIR-VELPATASVIR (EPCLUSA) 400-100 MG TABS    Take 1 tablet by mouth daily.   TRAZODONE (DESYREL) 50 MG TABLET    Take 0.5-1 tablets (25-50 mg total) by mouth at bedtime as needed for sleep.  Modified Medications   No medications on file  Discontinued Medications   No medications on file    Allergies: Allergies  Allergen Reactions   Hydrocodone Hives   Ibuprofen Other (See Comments)    Pt can't take this medication because it interacts with other medications that he is taking.  Past Medical History: Past Medical History:  Diagnosis Date   A-fib (HCC)    Chronic headaches    COPD (chronic obstructive pulmonary disease) (HCC)    GERD (gastroesophageal reflux disease)    History of blood transfusion    Hypertension    MI (myocardial infarction) (HCC)    Murmur 02/12/2021   Seizures (HCC)     Social History: Social History   Socioeconomic History   Marital status: Married    Spouse name: Not on file   Number of children: Not on file   Years of education: Not on file   Highest education level: Not on file  Occupational History   Not on file  Tobacco Use   Smoking status: Every Day    Packs/day: 1.00    Pack years: 0.00    Types: Cigarettes   Smokeless tobacco: Never  Vaping Use   Vaping Use:  Never used  Substance and Sexual Activity   Alcohol use: Not Currently    Alcohol/week: 0.0 standard drinks   Drug use: Yes    Types: Marijuana    Comment: OCC   Sexual activity: Not Currently  Other Topics Concern   Not on file  Social History Narrative   Not on file   Social Determinants of Health   Financial Resource Strain: Not on file  Food Insecurity: Not on file  Transportation Needs: Not on file  Physical Activity: Not on file  Stress: Not on file  Social Connections: Not on file    Labs: Hepatitis C Lab Results  Component Value Date   HCVGENOTYPE 1a 02/28/2021   FIBROSTAGE F0-F1 02/28/2021   Hepatitis B Lab Results  Component Value Date   HEPBSAB NON-REACTIVE 02/28/2021   HEPBSAG NON-REACTIVE 02/28/2021   Hepatitis A No results found for: HAV HIV Lab Results  Component Value Date   HIV NON-REACTIVE 02/28/2021   HIV Non Reactive 09/11/2020   HIV NONREACTIVE 03/22/2016   Lab Results  Component Value Date   CREATININE 0.93 11/09/2020   CREATININE 1.06 10/07/2020   CREATININE 1.10 09/21/2020   CREATININE 1.07 09/20/2020   CREATININE 1.09 09/19/2020   Lab Results  Component Value Date   AST 14 02/28/2021   AST 11 02/16/2021   AST 14 11/09/2020   ALT 14 02/28/2021   ALT 14 02/28/2021   ALT 15 02/16/2021   INR 1.0 02/28/2021   INR 1.1 09/11/2020    Assessment: Jason Romero is here today with his significant other to follow up for his chronic Hepatitis C infection. He started taking 12 weeks of Epclusa back in mid-May. He just started his second bottle. No issues getting it from the pharmacy or with refills.  Noted drug interaction with his atorvastatin. Patient has stopped taking this since starting Epclusa. He is using OTC fish oil instead. Advised them that he can resume taking Lipitor once finished with Epclusa,  He is also taking Protonix 40 mg at night. He usually takes the Epclusa around 3pm and the Protonix around 8-9pm. I advised them of the drug  interaction between Protonix and Epclusa. He will stop taking Protonix for the remainder of his therapy. He states that he doesn't think he needs it anyway.  No missed doses and no side effects noted. Encouraged continued compliance throughout the rest of his therapy. I will check labs today, see him back in 2 months for his end of therapy visit, and have him see Tammy Sours again in November for his cure visit.   Plan: -  Continue Epclusa x 12 weeks - Hep C RNA + CMET today - F/u with me again in August  Brehanna Deveny L. Hugh Kamara, PharmD, BCIDP, AAHIVP, CPP Clinical Pharmacist Practitioner Infectious Diseases Clinical Pharmacist [facility] 04/28/2021, 10:10 AM

## 2021-04-30 LAB — COMPREHENSIVE METABOLIC PANEL
AG Ratio: 1.5 (calc) (ref 1.0–2.5)
ALT: 9 U/L (ref 9–46)
AST: 10 U/L (ref 10–35)
Albumin: 4.6 g/dL (ref 3.6–5.1)
Alkaline phosphatase (APISO): 83 U/L (ref 35–144)
BUN: 13 mg/dL (ref 7–25)
CO2: 22 mmol/L (ref 20–32)
Calcium: 10 mg/dL (ref 8.6–10.3)
Chloride: 106 mmol/L (ref 98–110)
Creat: 0.92 mg/dL (ref 0.70–1.33)
Globulin: 3.1 g/dL (calc) (ref 1.9–3.7)
Glucose, Bld: 90 mg/dL (ref 65–99)
Potassium: 4.1 mmol/L (ref 3.5–5.3)
Sodium: 140 mmol/L (ref 135–146)
Total Bilirubin: 0.5 mg/dL (ref 0.2–1.2)
Total Protein: 7.7 g/dL (ref 6.1–8.1)

## 2021-04-30 LAB — HEPATITIS C RNA QUANTITATIVE
HCV Quantitative Log: <1.18 log IU/mL
HCV RNA, PCR, QN: <15 IU/mL

## 2021-05-01 ENCOUNTER — Other Ambulatory Visit: Payer: Self-pay | Admitting: Critical Care Medicine

## 2021-05-01 ENCOUNTER — Other Ambulatory Visit: Payer: Self-pay

## 2021-05-01 DIAGNOSIS — I10 Essential (primary) hypertension: Secondary | ICD-10-CM

## 2021-05-01 DIAGNOSIS — I619 Nontraumatic intracerebral hemorrhage, unspecified: Secondary | ICD-10-CM

## 2021-05-01 MED ORDER — LABETALOL HCL 300 MG PO TABS
ORAL_TABLET | ORAL | 0 refills | Status: DC
  Filled 2021-05-01: qty 30, 10d supply, fill #0

## 2021-05-01 NOTE — Progress Notes (Signed)
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 Paroxysmal atrial fibrillation - rate controlled. Hypertension  1.To treat spasms/spasticity, start baclofen 10mg  three times daily.  If ineffective, will consider referral to physical medicine and rehab for Botox. 2. Continue gabapentin 600mg  three times daily 3.  Blood pressure quite elevated.  He is going to [facility] from here to pick up his medication.  He will inform them about his blood pressure. 4.  Follow up 6 months.   Subjective:  Jason Romero is a 50 year old left-handed male with a fib, COPD, HTN, CAD s/p MI, chronic hepatitis and history of seizures who presents for nerve pain and history of stroke.  History supplemented by referring provider's note and hospital records. MRI of brain from hospitalization in October personally reviewed.  He is accompanied by his wife.  Current medications:  gabapentin 600mg  three times daily, fish oil, labetalol, lisinopril, hydralazine  He was admitted to [facility] on 09/11/2020 for hypertensive left basal ganglia hemorrhage presenting as right sided facial droop, right sided facial weakness and slurred speech.  CTA head and neck unremarkable.  Echocartdiogram showed EF 50-55% with no cardiac source of emboli.  Previously on ASA which was stopped.  Later, started on [facility] given a fib.  Since the stroke, he reports right sided upper and lower extremity pain.  Burning pain is well controlled with gabapentin.  However, he also has intermittent severe aching pain as  well, which the gabapentin doesn't touch.  He does have back pain as well, which he attributes to being mostly nonambulatory since his stroke.  He continues to have effortful speech as well.     PAST MEDICAL HISTORY: Past Medical History:  Diagnosis Date   A-fib [facility])    Chronic headaches    COPD (chronic obstructive pulmonary disease) (HCC)    GERD (gastroesophageal reflux disease)    History of blood transfusion    Hypertension    MI (myocardial infarction) (HCC)    Murmur 02/12/2021   Seizures (HCC)     PAST SURGICAL HISTORY: Past Surgical History:  Procedure Laterality Date   CHOLECYSTECTOMY      MEDICATIONS: Current Outpatient Medications on File Prior to Visit  Medication Sig Dispense Refill   acetaminophen (TYLENOL) 325 MG tablet Take 2 tablets (650 mg total) by mouth every 4 (four) hours as needed for mild pain (or temp > 37.5 C (99.5 F)).     amLODipine (NORVASC) 10 MG tablet TAKE 1 TABLET (10 MG TOTAL) BY MOUTH DAILY. 30 tablet 0   atorvastatin (LIPITOR) 20 MG tablet HOLD UNTIL OFF EPCLUSA 60 tablet 1   citalopram (CELEXA) 20 MG tablet      gabapentin (NEURONTIN) 300 MG capsule TAKE 1 CAPSULE AT 8 AM AND 4 PM AND 2 CAPSULES AT BEDTIME 120 capsule 3   hydrALAZINE (APRESOLINE) 50 MG tablet TAKE 1 TABLET (50 MG TOTAL) BY MOUTH 3 (THREE) TIMES DAILY. 90 tablet 0   labetalol (NORMODYNE) 300 MG tablet TAKE 1 TABLET (300 MG TOTAL) BY MOUTH EVERY 8 (EIGHT) HOURS. 30 tablet 0   lisinopril (ZESTRIL) 20 MG  tablet TAKE 1 TABLET (20 MG TOTAL) BY MOUTH 2 (TWO) TIMES DAILY. 60 tablet 0   nicotine polacrilex (NICORETTE) 2 MG lozenge Take 1 lozenge (2 mg total) by mouth as needed for smoking cessation. 100 lozenge 0   pantoprazole (PROTONIX) 40 MG tablet TAKE 1 TABLET (40 MG TOTAL) BY MOUTH DAILY. 30 tablet 2   senna (SENOKOT) 8.6 MG TABS tablet Take 1 tablet (8.6 mg total) by mouth 2 (two) times daily. 120 tablet 0   sertraline (ZOLOFT) 50 MG tablet Take 1 tablet (50 mg total) by mouth  daily. 30 tablet 3   Sofosbuvir-Velpatasvir (EPCLUSA) 400-100 MG TABS Take 1 tablet by mouth daily. 28 tablet 2   traZODone (DESYREL) 50 MG tablet Take 0.5-1 tablets (25-50 mg total) by mouth at bedtime as needed for sleep. 30 tablet 1   No current facility-administered medications on file prior to visit.    ALLERGIES: Allergies  Allergen Reactions   Hydrocodone Hives   Ibuprofen Other (See Comments)    Pt can't take this medication because it interacts with other medications that he is taking.     FAMILY HISTORY: Family History  Problem Relation Age of Onset   Alcoholism Other    Arthritis Other    Breast cancer Other    Heart disease Mother     Objective:  Blood pressure (!) 173/115, pulse 89, height 6\' 2"  (1.88 m), weight 227 lb (103 kg), SpO2 99 %. General: No acute distress.  Patient appears well-groomed.   Head:  Normocephalic/atraumatic Eyes:  fundi examined but not visualized Neck: supple, no paraspinal tenderness, full range of motion Back: No paraspinal tenderness Heart: regular rate and rhythm Lungs: Clear to auscultation bilaterally. Vascular: No carotid bruits. Neurological Exam: Mental status: alert and oriented to person, place, and time, Slightly slowed and effortful speech.  Able to follow commands. Cranial nerves: CN I: not tested CN II: pupils equal, round and reactive to light, visual fields intact CN III, IV, VI:  full range of motion, no nystagmus, no ptosis CN V: reduced right V1-V3 sensation CN VII: right lower facial weakness CN VIII: hearing intact CN IX, X: gag intact, uvula midline CN XI: sternocleidomastoid and trapezius muscles intact CN XII: tongue midline Bulk & Tone: Increased tone/spasticity in proximal right upper extremity and right lower extremity; no fasciculations. Motor:  4+ right hand grip, 3- right hip flexion, 5 right knee extension, otherwise right upper and lower extremity muscles plegic.  5/5 left upper and lower  extremities. Sensation:  Pinprick and vibratory sensation reduced in right upper and lower extremities. Deep Tendon Reflexes:  3+ right upper and lower extremities, 2+ left upper and lower extremities,  toes equivocal    Finger to nose testing:  Without dysmetria on left, unable to assess right     Gait:  Deferred.  In wheelchair.    Thank you for allowing me to take part in the care of this patient.  , DO  CC: Shon Millet, MD

## 2021-05-02 ENCOUNTER — Ambulatory Visit (INDEPENDENT_AMBULATORY_CARE_PROVIDER_SITE_OTHER): Payer: 59 | Admitting: Neurology

## 2021-05-02 ENCOUNTER — Encounter: Payer: Self-pay | Admitting: Neurology

## 2021-05-02 ENCOUNTER — Other Ambulatory Visit: Payer: Self-pay

## 2021-05-02 ENCOUNTER — Telehealth: Payer: Self-pay | Admitting: Critical Care Medicine

## 2021-05-02 VITALS — BP 173/115 | HR 89 | Ht 74.0 in | Wt 227.0 lb

## 2021-05-02 DIAGNOSIS — I619 Nontraumatic intracerebral hemorrhage, unspecified: Secondary | ICD-10-CM | POA: Diagnosis not present

## 2021-05-02 DIAGNOSIS — I10 Essential (primary) hypertension: Secondary | ICD-10-CM | POA: Diagnosis not present

## 2021-05-02 DIAGNOSIS — G890 Central pain syndrome: Secondary | ICD-10-CM

## 2021-05-02 DIAGNOSIS — I480 Paroxysmal atrial fibrillation: Secondary | ICD-10-CM

## 2021-05-02 DIAGNOSIS — I69951 Hemiplegia and hemiparesis following unspecified cerebrovascular disease affecting right dominant side: Secondary | ICD-10-CM

## 2021-05-02 MED ORDER — BACLOFEN 10 MG PO TABS
10.0000 mg | ORAL_TABLET | Freq: Three times a day (TID) | ORAL | 5 refills | Status: DC
  Filled 2021-05-02: qty 90, 30d supply, fill #0
  Filled 2021-06-01: qty 90, 30d supply, fill #1

## 2021-05-02 NOTE — Telephone Encounter (Signed)
Spoke to wife, states patient did not take blood pressure medication. Has since taken medications and BP reading are better. 113/84. States patient is doing well. Alert, watching a movie and petting the cat.

## 2021-05-02 NOTE — Telephone Encounter (Signed)
Patients wife Cavion Faiola came by and wanted Dr. [name] to know Jason Romero blood pressure at his neurology appt today was 184/123 with a small cuff and 173/115 with a normal cuff. Wife stated patient BP has never been this high and wanted to know if he should do anything. Please advise.

## 2021-05-02 NOTE — Patient Instructions (Signed)
Start baclofen 10mg  three times daily to help with pain

## 2021-05-03 ENCOUNTER — Other Ambulatory Visit: Payer: Self-pay

## 2021-05-10 ENCOUNTER — Other Ambulatory Visit: Payer: Self-pay | Admitting: Physician Assistant

## 2021-05-10 ENCOUNTER — Other Ambulatory Visit: Payer: Self-pay

## 2021-05-10 DIAGNOSIS — I10 Essential (primary) hypertension: Secondary | ICD-10-CM

## 2021-05-10 DIAGNOSIS — G8110 Spastic hemiplegia affecting unspecified side: Secondary | ICD-10-CM

## 2021-05-10 DIAGNOSIS — I619 Nontraumatic intracerebral hemorrhage, unspecified: Secondary | ICD-10-CM

## 2021-05-10 MED ORDER — LISINOPRIL 20 MG PO TABS
20.0000 mg | ORAL_TABLET | Freq: Two times a day (BID) | ORAL | 0 refills | Status: DC
  Filled 2021-05-10: qty 60, 30d supply, fill #0

## 2021-05-10 NOTE — Telephone Encounter (Signed)
  Notes to clinic:  Review directions for refill    Requested Prescriptions  Pending Prescriptions Disp Refills   lisinopril (ZESTRIL) 20 MG tablet 60 tablet 0    Sig: Take by mouth 2 (two) times daily.      Cardiovascular:  ACE Inhibitors Failed - 05/10/2021 10:36 AM      Failed - Last BP in normal range    BP Readings from Last 1 Encounters:  05/02/21 (!) 173/115          Passed - Cr in normal range and within 180 days    Creat  Date Value Ref Range Status  04/28/2021 0.92 0.70 - 1.33 mg/dL Final    Comment:    For patients >35 years of age, the reference limit for Creatinine is approximately 13% higher for people identified as African-American. .           Passed - K in normal range and within 180 days    Potassium  Date Value Ref Range Status  04/28/2021 4.1 3.5 - 5.3 mmol/L Final  03/10/2012 4.3 3.5 - 5.1 mmol/L Final          Passed - Patient is not pregnant      Passed - Valid encounter within last 6 months    Recent Outpatient Visits           2 months ago 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)   [facility] Storm Frisk, MD   6 months ago Essential hypertension   [facility] [place], [place]ella Schlein, [place]       Future Appointments             In 3 weeks [name] [name], MD [facility]

## 2021-05-11 ENCOUNTER — Other Ambulatory Visit: Payer: Self-pay

## 2021-05-11 ENCOUNTER — Other Ambulatory Visit: Payer: Self-pay | Admitting: Critical Care Medicine

## 2021-05-11 DIAGNOSIS — I10 Essential (primary) hypertension: Secondary | ICD-10-CM

## 2021-05-11 DIAGNOSIS — I619 Nontraumatic intracerebral hemorrhage, unspecified: Secondary | ICD-10-CM

## 2021-05-11 NOTE — Telephone Encounter (Signed)
}    Notes to clinic:  Patient has appt on 06/01/2021 Script filled on 05/01/2021 Review for another refill    Requested Prescriptions  Pending Prescriptions Disp Refills   labetalol (NORMODYNE) 300 MG tablet 30 tablet 0      Cardiovascular:  Beta Blockers Failed - 05/11/2021  8:25 AM      Failed - Last BP in normal range    BP Readings from Last 1 Encounters:  05/02/21 (!) 173/115          Passed - Last Heart Rate in normal range    Pulse Readings from Last 1 Encounters:  05/02/21 89          Passed - Valid encounter within last 6 months    Recent Outpatient Visits           2 months ago 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)   [facility] Storm Frisk, MD   6 months ago Essential hypertension   [facility] [place], [place], [place]       Future Appointments             In 3 weeks [name] [name], MD [facility]

## 2021-05-12 ENCOUNTER — Other Ambulatory Visit: Payer: Self-pay | Admitting: Critical Care Medicine

## 2021-05-12 ENCOUNTER — Other Ambulatory Visit: Payer: Self-pay | Admitting: Physician Assistant

## 2021-05-12 ENCOUNTER — Other Ambulatory Visit: Payer: Self-pay

## 2021-05-12 DIAGNOSIS — I10 Essential (primary) hypertension: Secondary | ICD-10-CM

## 2021-05-12 DIAGNOSIS — I619 Nontraumatic intracerebral hemorrhage, unspecified: Secondary | ICD-10-CM

## 2021-05-12 DIAGNOSIS — G8110 Spastic hemiplegia affecting unspecified side: Secondary | ICD-10-CM

## 2021-05-12 MED ORDER — LABETALOL HCL 300 MG PO TABS
ORAL_TABLET | ORAL | 2 refills | Status: DC
  Filled 2021-05-12: qty 90, 30d supply, fill #0

## 2021-05-12 NOTE — Telephone Encounter (Signed)
  Notes to clinic:  Patient has appointment on 7/21/20222 Review for refill  Last filled on 05/02/2021   Requested Prescriptions  Pending Prescriptions Disp Refills   labetalol (NORMODYNE) 300 MG tablet 30 tablet 0    Sig: TAKE 1 TABLET (300 MG TOTAL) BY MOUTH EVERY 8 (EIGHT) HOURS.      Cardiovascular:  Beta Blockers Failed - 05/12/2021 10:32 AM      Failed - Last BP in normal range    BP Readings from Last 1 Encounters:  05/02/21 (!) 173/115          Passed - Last Heart Rate in normal range    Pulse Readings from Last 1 Encounters:  05/02/21 89          Passed - Valid encounter within last 6 months    Recent Outpatient Visits           2 months ago 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)   [place] [facility] Storm Frisk, MD   6 months ago Essential hypertension   [facility] [place], [place]ella [place], [place]       Future Appointments             In 2 weeks [name] [name], MD [facility]

## 2021-05-16 ENCOUNTER — Other Ambulatory Visit: Payer: Self-pay

## 2021-05-16 ENCOUNTER — Other Ambulatory Visit: Payer: Self-pay | Admitting: Critical Care Medicine

## 2021-05-16 DIAGNOSIS — I10 Essential (primary) hypertension: Secondary | ICD-10-CM

## 2021-05-16 DIAGNOSIS — I619 Nontraumatic intracerebral hemorrhage, unspecified: Secondary | ICD-10-CM

## 2021-05-16 MED ORDER — HYDRALAZINE HCL 50 MG PO TABS
ORAL_TABLET | Freq: Three times a day (TID) | ORAL | 0 refills | Status: DC
  Filled 2021-05-16: qty 90, 30d supply, fill #0

## 2021-05-29 ENCOUNTER — Other Ambulatory Visit: Payer: Self-pay

## 2021-06-01 ENCOUNTER — Ambulatory Visit: Payer: 59 | Admitting: Critical Care Medicine

## 2021-06-01 ENCOUNTER — Other Ambulatory Visit: Payer: Self-pay

## 2021-06-01 ENCOUNTER — Ambulatory Visit: Payer: 59 | Attending: Critical Care Medicine | Admitting: Critical Care Medicine

## 2021-06-01 ENCOUNTER — Telehealth: Payer: Self-pay

## 2021-06-01 ENCOUNTER — Encounter: Payer: Self-pay | Admitting: Critical Care Medicine

## 2021-06-01 VITALS — BP 141/94 | HR 64 | Resp 16

## 2021-06-01 DIAGNOSIS — I10 Essential (primary) hypertension: Secondary | ICD-10-CM

## 2021-06-01 DIAGNOSIS — I610 Nontraumatic intracerebral hemorrhage in hemisphere, subcortical: Secondary | ICD-10-CM

## 2021-06-01 DIAGNOSIS — K219 Gastro-esophageal reflux disease without esophagitis: Secondary | ICD-10-CM

## 2021-06-01 DIAGNOSIS — F17290 Nicotine dependence, other tobacco product, uncomplicated: Secondary | ICD-10-CM

## 2021-06-01 DIAGNOSIS — Z1211 Encounter for screening for malignant neoplasm of colon: Secondary | ICD-10-CM

## 2021-06-01 DIAGNOSIS — I69391 Dysphagia following cerebral infarction: Secondary | ICD-10-CM

## 2021-06-01 DIAGNOSIS — Z8679 Personal history of other diseases of the circulatory system: Secondary | ICD-10-CM

## 2021-06-01 DIAGNOSIS — I619 Nontraumatic intracerebral hemorrhage, unspecified: Secondary | ICD-10-CM

## 2021-06-01 DIAGNOSIS — B182 Chronic viral hepatitis C: Secondary | ICD-10-CM

## 2021-06-01 DIAGNOSIS — G8110 Spastic hemiplegia affecting unspecified side: Secondary | ICD-10-CM

## 2021-06-01 DIAGNOSIS — R269 Unspecified abnormalities of gait and mobility: Secondary | ICD-10-CM

## 2021-06-01 DIAGNOSIS — F3289 Other specified depressive episodes: Secondary | ICD-10-CM | POA: Diagnosis not present

## 2021-06-01 DIAGNOSIS — I480 Paroxysmal atrial fibrillation: Secondary | ICD-10-CM

## 2021-06-01 DIAGNOSIS — E782 Mixed hyperlipidemia: Secondary | ICD-10-CM

## 2021-06-01 DIAGNOSIS — I69398 Other sequelae of cerebral infarction: Secondary | ICD-10-CM

## 2021-06-01 DIAGNOSIS — Z720 Tobacco use: Secondary | ICD-10-CM

## 2021-06-01 DIAGNOSIS — I6930 Unspecified sequelae of cerebral infarction: Secondary | ICD-10-CM

## 2021-06-01 MED ORDER — BACLOFEN 10 MG PO TABS
10.0000 mg | ORAL_TABLET | Freq: Three times a day (TID) | ORAL | 5 refills | Status: DC
  Filled 2021-06-29: qty 90, 30d supply, fill #0
  Filled 2021-07-28: qty 90, 30d supply, fill #1

## 2021-06-01 MED ORDER — AMLODIPINE BESYLATE 10 MG PO TABS
ORAL_TABLET | Freq: Every day | ORAL | 1 refills | Status: DC
  Filled 2021-06-01: qty 90, 90d supply, fill #0

## 2021-06-01 MED ORDER — BLOOD PRESSURE KIT DEVI: 0 refills | Status: AC

## 2021-06-01 MED ORDER — PANTOPRAZOLE SODIUM 40 MG PO TBEC
DELAYED_RELEASE_TABLET | Freq: Every day | ORAL | 2 refills | Status: DC
  Filled 2021-06-01: qty 30, 30d supply, fill #0

## 2021-06-01 MED ORDER — TRAZODONE HCL 50 MG PO TABS
25.0000 mg | ORAL_TABLET | Freq: Every evening | ORAL | 1 refills | Status: DC | PRN
  Filled 2021-06-01: qty 30, 30d supply, fill #0
  Filled 2021-06-27: qty 30, 30d supply, fill #1

## 2021-06-01 MED ORDER — LISINOPRIL 40 MG PO TABS
40.0000 mg | ORAL_TABLET | Freq: Two times a day (BID) | ORAL | 2 refills | Status: DC
  Filled 2021-06-01: qty 60, 30d supply, fill #0
  Filled 2021-07-05: qty 60, 30d supply, fill #1

## 2021-06-01 MED ORDER — LABETALOL HCL 300 MG PO TABS
ORAL_TABLET | ORAL | 2 refills | Status: DC
  Filled 2021-06-01: qty 90, fill #0

## 2021-06-01 MED ORDER — HYDRALAZINE HCL 50 MG PO TABS
ORAL_TABLET | Freq: Three times a day (TID) | ORAL | 2 refills | Status: DC
  Filled 2021-06-01: qty 90, fill #0

## 2021-06-01 MED ORDER — SPIRONOLACTONE 25 MG PO TABS
25.0000 mg | ORAL_TABLET | Freq: Every day | ORAL | 1 refills | Status: DC
  Filled 2021-06-01: qty 90, 90d supply, fill #0

## 2021-06-01 MED ORDER — GABAPENTIN 300 MG PO CAPS
ORAL_CAPSULE | ORAL | 3 refills | Status: DC
  Filled 2021-06-01: qty 120, 30d supply, fill #0
  Filled 2021-06-27: qty 120, 30d supply, fill #1

## 2021-06-01 NOTE — Assessment & Plan Note (Signed)
Patient did not tolerate sertraline. Caused worsening mood swings. Discontinued. Will address again at upcoming visit after PT and home health have been established.

## 2021-06-01 NOTE — Assessment & Plan Note (Signed)
Holding atorvastatin until off Epclusa then resume

## 2021-06-01 NOTE — Assessment & Plan Note (Addendum)
Right sided hemiplegia and confined to wheelchair. Wife is primary caregiver and patient never completed PT. Will sent referral for PT and PCS . Patient cannot ambulate but denies new vision problems, or dysphagia.   RN Case manager met with them during the visit and made a plan to have his medicines mailed to their home.

## 2021-06-01 NOTE — Telephone Encounter (Signed)
Met with the patient and his wife when they were in the clinic today to discuss community services.   His wife has been providing all of the assistance he needs with ADLS. He was agreeable to [facility] and a referral was place to [employer].   He was also interested in home health PT. Explained to them that this CM will need to find an agency in network with his insurance that  has staffing available to accept the referral.  There is no guarantee that an agency will be found but we will try.  Regarding transportation, patient's wife is aware of rides available through [place] [employer]. Explained to her that [facility] can also provide transportation to medical appointments if needed.  They can transport via wheelchair but will not help the patient out of the home. Patient and his wife acknowledged this requirement as patient's wife already has to get him out of the home for appointments. Instructed her to call this CM with questions regarding transportation.  They are interested in medication delivery.  This CM contacted [facility] and [facility] [employer] [facility] and neither deliver to [place].  Patient can have current medication orders filled at [facility] and mailed to his home. This CM will check other options for pharmacy home delivery.

## 2021-06-01 NOTE — Assessment & Plan Note (Signed)
Plan home bound PT

## 2021-06-01 NOTE — Assessment & Plan Note (Signed)
Remains elevated on amlodipine 10mg , hydralazine 50mg , labetalol 300mg , and lisinopril 20mg . Prescription given for home BP cuff. CMP was normal in mid June. Plan to add spironolactone 25mg  to regimen. Patient will return in 2 months for recheck.

## 2021-06-01 NOTE — Patient Instructions (Signed)
Home Physical therapy will be provided  All of your medications refills will be sent to your home  Stop all antidepressants  I will ask my clinical social worker behavioral therapist to call you regarding your mental health  Focus on reducing tobacco intake  A cologuard test will be mailed to the house for colon cancer screening  Next visit will be virtual with Dr [name] in 6 weeks  A blood pressure monitor will be obtained

## 2021-06-01 NOTE — Assessment & Plan Note (Signed)
Pt is not on blood thinners due to hemorrhagic stroke. He experiences mild intermittent palpitations but it does not bother him. Denies syncope. Continue labetalol 300mg .

## 2021-06-01 NOTE — Assessment & Plan Note (Signed)
Managed by GI and on final bottle of epclusa. Plan to restart statin once finished.

## 2021-06-01 NOTE — Assessment & Plan Note (Signed)
Smoking 5-10 cigarettes per day and discussed the importance of cessation. 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

## 2021-06-01 NOTE — Assessment & Plan Note (Signed)
Pt reports very mild dysphagia and often has acclimated to taking smaller bites and eating foods that are easy to swallow. Denies choking or vomiting.

## 2021-06-01 NOTE — Assessment & Plan Note (Signed)
Continue pantoprazole every day for reflux. Benign abdominal exam today.

## 2021-06-01 NOTE — Progress Notes (Signed)
Established Patient Office Visit  Subjective:  Patient ID: Jason Romero, male    DOB: 1970-12-12  Age: 50 y.o. MRN: [id number]  CC:  Chief Complaint  Patient presents with   Hypertension   Depression   Atrial Fibrillation    HPI Jason Romero  is a 50 y.o left-handed M here today to follow up on his chronic conditions and refill his medicines. 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 The patient reports a burning sensation in the affected arm but denies spasticity in his legs. The patient reports minor relief of symptoms with gabapentin, and his sleep has improved with trazodone. The patient can stand for a few seconds but is unable to walk, cannot use a walker and presents in a wheelchair. The patient denies urinary incontinence and reports utilizing a bedside toilet. The patient can swallow and eat without difficulties. His wife is his primary caregiver who helps with his ADLs. She quit her job to care for her husband and they are both unemployed, living in a mobile home and do not have their own transportation. He has not gotten any physical therapy following his stroke and also inquires about home health. The patient still smokes on average 5-10 cigarettes/ day and marijuana.   He is also being treated for hepatitis C by gastroenterology. His wife reports he is on his last bottle of Epclusa. He is currently not taking his statin due to drug interaction with Epclusa  At his previous visit, the patient was started on sertraline 50 mg for depressed mood following his stroke. His wife reports the medicine worsened his mood and caused more mood swings. They have discontinued the med and he reports feeling about the same. He denies suicidal thoughts or ideation.   He is currently taking amlodipine 60m, hydralazine 57m labetalol 30083mand lisinopril 15m2mr his BP. He says his BP is normally elevated in the  medical office due to stress. BP was 210/190 upon recheck. He does not have a cuff. We will give the patient a prescription for a BP cuff for him to take to the pharmacy.   The patient and his wife report large financial stressors as they are both unemployed and do not have transportation.  Past Medical History:  Diagnosis Date   [facility]    COPD (chronic obstructive pulmonary disease) (HCC)    GERD (gastroesophageal reflux disease)    History of blood transfusion    Hypertension    MI (myocardial infarction) (HCC)[facility] 02/12/2021   Seizures (HCC)     Past Surgical History:  Procedure Laterality Date   CHOLECYSTECTOMY      Family History  Problem Relation Age of Onset   Alcoholism Other    Arthritis Other    Breast cancer Other    Heart disease Mother     Social History   Socioeconomic History   Marital status: Married    Spouse name: Not on file   Number of children: Not on file   Years of education: Not on file   Highest education level: Not on file  Occupational History   Not on file  Tobacco Use   Smoking status: Every Day    Packs/day: 1.00    Types: Cigarettes   Smokeless tobacco: Never  Vaping Use   Vaping Use: Never used  Substance and Sexual Activity  Alcohol use: Not Currently    Alcohol/week: 0.0 standard drinks   Drug use: Yes    Types: Marijuana    Comment: OCC   Sexual activity: Not Currently  Other Topics Concern   Not on file  Social History Narrative   Left handed   Social Determinants of Health   Financial Resource Strain: Not on file  Food Insecurity: Not on file  Transportation Needs: Not on file  Physical Activity: Not on file  Stress: Not on file  Social Connections: Not on file  Intimate Partner Violence: Not on file    Outpatient Medications Prior to Visit  Medication Sig Dispense Refill   acetaminophen (TYLENOL) 325 MG tablet Take 2 tablets (650 mg total) by mouth every 4 (four) hours as  needed for mild pain (or temp > 37.5 C (99.5 F)).     atorvastatin (LIPITOR) 20 MG tablet HOLD UNTIL OFF EPCLUSA 60 tablet 1   nicotine polacrilex (NICORETTE) 2 MG lozenge Take 1 lozenge (2 mg total) by mouth as needed for smoking cessation. 100 lozenge 0   senna (SENOKOT) 8.6 MG TABS tablet Take 1 tablet (8.6 mg total) by mouth 2 (two) times daily. 120 tablet 0   Sofosbuvir-Velpatasvir (EPCLUSA) 400-100 MG TABS Take 1 tablet by mouth daily. 28 tablet 2   amLODipine (NORVASC) 10 MG tablet TAKE 1 TABLET (10 MG TOTAL) BY MOUTH DAILY. 30 tablet 0   baclofen (LIORESAL) 10 MG tablet Take 1 tablet (10 mg total) by mouth 3 (three) times daily. 90 each 5   gabapentin (NEURONTIN) 300 MG capsule TAKE 1 CAPSULE AT 8 AM AND 4 PM AND 2 CAPSULES AT BEDTIME 120 capsule 3   hydrALAZINE (APRESOLINE) 50 MG tablet Take 1 tablet  by mouth 3 (three) times daily. 90 tablet 0   labetalol (NORMODYNE) 300 MG tablet TAKE 1 TABLET (300 MG TOTAL) BY MOUTH EVERY 8 (EIGHT) HOURS. 90 tablet 2   lisinopril (ZESTRIL) 20 MG tablet Take 1 tablet (20 mg total) by mouth 2 (two) times daily. 60 tablet 0   pantoprazole (PROTONIX) 40 MG tablet TAKE 1 TABLET (40 MG TOTAL) BY MOUTH DAILY. 30 tablet 2   traZODone (DESYREL) 50 MG tablet Take 0.5-1 tablets (25-50 mg total) by mouth at bedtime as needed for sleep. 30 tablet 1   citalopram (CELEXA) 20 MG tablet  (Patient not taking: Reported on 06/01/2021)     sertraline (ZOLOFT) 50 MG tablet Take 1 tablet (50 mg total) by mouth daily. (Patient not taking: Reported on 06/01/2021) 30 tablet 3   No facility-administered medications prior to visit.    Allergies  Allergen Reactions   Hydrocodone Hives   Ibuprofen Other (See Comments)    Pt can't take this medication because it interacts with other medications that he is taking.     ROS Review of Systems  Constitutional:  Negative for chills, diaphoresis and fever.  HENT:  Negative for congestion, rhinorrhea, sinus pain, sore throat and  trouble swallowing.   Eyes:  Positive for visual disturbance.  Respiratory:  Negative for cough, choking and shortness of breath.   Cardiovascular:  Positive for palpitations and leg swelling. Negative for chest pain.  Gastrointestinal:  Negative for abdominal pain, constipation, diarrhea, nausea and vomiting.  Endocrine: Negative for polydipsia and polyphagia.  Genitourinary:  Negative for difficulty urinating.  Musculoskeletal:  Positive for myalgias. Negative for back pain.  Skin:  Negative for pallor and rash.  Allergic/Immunologic: Negative.   Neurological:  Negative for tremors, weakness, light-headedness  and headaches.  Psychiatric/Behavioral:  Positive for dysphoric mood and sleep disturbance. Negative for suicidal ideas.      Objective:    Physical Exam Constitutional:      Appearance: Normal appearance.  HENT:     Head: Normocephalic.     Nose: Nose normal. No congestion or rhinorrhea.     Mouth/Throat:     Mouth: Mucous membranes are moist.     Pharynx: No oropharyngeal exudate or posterior oropharyngeal erythema.  Eyes:     General:        Left eye: No discharge.  Cardiovascular:     Rate and Rhythm: Normal rate and regular rhythm.     Pulses: Normal pulses.     Heart sounds: Normal heart sounds.  Pulmonary:     Effort: Pulmonary effort is normal.     Breath sounds: Normal breath sounds.  Abdominal:     General: Bowel sounds are normal.  Genitourinary:    Penis: Normal.   Musculoskeletal:     Cervical back: Normal range of motion.     Right lower leg: No edema.     Left lower leg: No edema.     Comments: Decreased active ROM, muscle tone, and finger grasp in the right hand.   Skin:    General: Skin is warm and dry.  Neurological:     Mental Status: He is alert and oriented to person, place, and time.     Motor: Weakness present.     Gait: Gait abnormal.     Comments: right side hemiparesis  Psychiatric:        Thought Content: Thought content normal.    BP (!) 141/94   Pulse 64   Resp 16   SpO2 95%  Wt Readings from Last 3 Encounters:  05/02/21 227 lb (103 kg)  10/19/20 227 lb 9.6 oz (103.2 kg)  09/20/20 240 lb 15.4 oz (109.3 kg)     Health Maintenance Due  Topic Date Due   Zoster Vaccines- Shingrix (1 of 2) Never done    There are no preventive care reminders to display for this patient.  Lab Results  Component Value Date   TSH 0.64 03/22/2016   Lab Results  Component Value Date   WBC 8.2 02/28/2021   HGB 13.1 (L) 02/28/2021   HCT 41.3 02/28/2021   MCV 87.1 02/28/2021   PLT 362 02/28/2021   Lab Results  Component Value Date   NA 140 04/28/2021   K 4.1 04/28/2021   CO2 22 04/28/2021   GLUCOSE 90 04/28/2021   BUN 13 04/28/2021   CREATININE 0.92 04/28/2021   BILITOT 0.5 04/28/2021   ALKPHOS 82 02/16/2021   AST 10 04/28/2021   ALT 9 04/28/2021   PROT 7.7 04/28/2021   ALBUMIN 4.4 02/16/2021   CALCIUM 10.0 04/28/2021   ANIONGAP 13 10/07/2020   GFR 70.92 02/27/2017   Lab Results  Component Value Date   CHOL 141 02/13/2021   Lab Results  Component Value Date   HDL 39 (L) 02/13/2021   Lab Results  Component Value Date   LDLCALC 87 02/13/2021   Lab Results  Component Value Date   TRIG 77 02/13/2021   Lab Results  Component Value Date   CHOLHDL 3.6 02/13/2021   Lab Results  Component Value Date   HGBA1C 5.6 09/12/2020      Assessment & Plan:  Essential hypertension Remains elevated on amlodipine 15m, hydralazine 558m labetalol 30050mand lisinopril 48m59mrescription given for home BP cuff.  CMP was normal in mid June. Plan to add spironolactone 29m to regimen. Patient will return in 2 months for recheck.    PAF (paroxysmal atrial fibrillation) (HCC) Pt is not on blood thinners due to hemorrhagic stroke. He experiences mild intermittent palpitations but it does not bother him. Denies syncope. Continue labetalol 3059m   History of stroke with residual deficit Right sided hemiplegia and  confined to wheelchair. Wife is primary caregiver and patient never completed PT. Will sent referral for PT and PCS . Patient cannot ambulate but denies new vision problems, or dysphagia.   RN Case manager met with them during the visit and made a plan to have his medicines mailed to their home.   Depression Patient did not tolerate sertraline. Caused worsening mood swings. Discontinued. Will address again at upcoming visit after PT and home health have been established.    Chronic hepatitis C without hepatic coma (HCHarrellsManaged by GI and on final bottle of epclusa. Plan to restart statin once finished.   Gastroesophageal reflux disease without esophagitis Continue pantoprazole every day for reflux. Benign abdominal exam today.   Dysphagia, post-stroke Pt reports very mild dysphagia and often has acclimated to taking smaller bites and eating foods that are easy to swallow. Denies choking or vomiting.   Tobacco abuse Smoking 5-10 cigarettes per day and discussed the importance of cessation. He also uses marijuana to help with neuropathy    Spastic hemiplegia affecting nondominant side (HCC) Continue gabapentin TID for post-CVA neuropathy. Patient also uses marijuana.     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) Plan home bound PT  Hyperlipidemia Holding atorvastatin until off Epclusa then resume    Current smoking consumption amount: 2 cigars every two days  Dicsussion on advise to quit smoking and smoking impacts: CV imparcts  Patient's willingness to quit:  yes  Methods to quit smoking discussed:  Nicotine replace  Medication management of smoking session drugs discussed:nicotine lozenge  Resources provided:  AVS   Setting quit date not est  Follow-up arranged 2 mo   Time spent counseling the patient:  5 min   Insomnia The patient reports minor relief of symptoms with gabapentin and an inability to sleep due to nerve pain. -   traZODone (DESYREL) 50 MG tablet; Take 0.5-1 tablets (25-50 mg total) by mouth at bedtime as needed for sleep.  Visual changes The reports fogginess in his vision. The patient has not had an eye exam and verbalized the need for glasses.  -     Ambulatory referral to Ophthalmology  Encounter for hepatitis C screening test for low risk patient -     HCV Ab w/Rflx to Verification  Colon cancer screening -     Fecal occult blood, imunochemical    Problem List Items Addressed This Visit       Cardiovascular and Mediastinum   Essential hypertension    Remains elevated on amlodipine 1081mhydralazine 73m32mabetalol 300mg88md lisinopril 20mg.69mscription given for home BP cuff. CMP was normal in mid June. Plan to add spironolactone 25mg t73mgimen. Patient will return in 2 months for recheck.         Relevant Medications   amLODipine (NORVASC) 10 MG tablet   hydrALAZINE (APRESOLINE) 50 MG tablet   labetalol (NORMODYNE) 300 MG tablet   lisinopril (ZESTRIL) 40 MG tablet   spironolactone (ALDACTONE) 25 MG tablet   PAF (paroxysmal atrial fibrillation) (HCC)    Pt is not on blood thinners due  to hemorrhagic stroke. He experiences mild intermittent palpitations but it does not bother him. Denies syncope. Continue labetalol 34m.        Relevant Medications   amLODipine (NORVASC) 10 MG tablet   hydrALAZINE (APRESOLINE) 50 MG tablet   labetalol (NORMODYNE) 300 MG tablet   lisinopril (ZESTRIL) 40 MG tablet   spironolactone (ALDACTONE) 25 MG tablet     Digestive   Dysphagia, post-stroke    Pt reports very mild dysphagia and often has acclimated to taking smaller bites and eating foods that are easy to swallow. Denies choking or vomiting.        Gastroesophageal reflux disease without esophagitis    Continue pantoprazole every day for reflux. Benign abdominal exam today.        Relevant Medications   pantoprazole (PROTONIX) 40 MG tablet   Chronic hepatitis C without hepatic  coma ([place]    Managed by GI and on final bottle of epclusa. Plan to restart statin once finished.          Nervous and Auditory   Spastic hemiplegia affecting nondominant side (HCC)    Continue gabapentin TID for post-CVA neuropathy. Patient also uses marijuana.          Relevant Medications   gabapentin (NEURONTIN) 300 MG capsule   Other Relevant Orders   [place] referral to [place]    Other   Tobacco abuse    Smoking 5-10 cigarettes per day and discussed the importance of cessation. He also uses marijuana to help with neuropathy         History of stroke with residual deficit - Primary    Right sided hemiplegia and confined to wheelchair. Wife is primary caregiver and patient never completed PT. Will sent referral for PT and PCS . Patient cannot ambulate but denies new vision problems, or dysphagia.   RN Case manager met with them during the visit and made a plan to have his medicines mailed to their home.        Relevant Medications   amLODipine (NORVASC) 10 MG tablet   hydrALAZINE (APRESOLINE) 50 MG tablet   labetalol (NORMODYNE) 300 MG tablet   lisinopril (ZESTRIL) 40 MG tablet   History of intracranial hemorrhage   Depression    Patient did not tolerate sertraline. Caused worsening mood swings. Discontinued. Will address again at upcoming visit after PT and home health have been established.         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    Dispense:  90 tablet    Refill:  1    Mail/deliver to patient home   hydrALAZINE (APRESOLINE) 50 MG tablet    Sig: Take 1 tablet  by mouth  3 (three) times daily.    Dispense:  90 tablet    Refill:  2    Mail/deliver to patient home   labetalol (NORMODYNE) 300 MG tablet    Sig: TAKE 1 TABLET (300 MG TOTAL) BY MOUTH EVERY 8 (EIGHT) HOURS.    Dispense:  90 tablet    Refill:  2    Mail/deliver to patient home   lisinopril (ZESTRIL) 40 MG tablet    Sig: Take 1 tablet (40 mg total) by mouth 2 (two) times daily.    Dispense:  90 tablet    Refill:  2    Mail/deliver to patient home   baclofen (LIORESAL) 10 MG tablet    Sig: Take 1 tablet (10 mg total) by mouth 3 (three) times daily.    Dispense:  90 each    Refill:  5    Mail/deliver to patient home   gabapentin (NEURONTIN) 300 MG capsule    Sig: TAKE 1 CAPSULE AT 8 AM AND 4 PM AND 2 CAPSULES AT BEDTIME    Dispense:  120 capsule    Refill:  3    Mail/deliver to patient home   pantoprazole (PROTONIX) 40 MG tablet    Sig: TAKE 1 TABLET (40 MG TOTAL) BY MOUTH DAILY.    Dispense:  30 tablet    Refill:  2    Mail/deliver to patient home   traZODone (DESYREL) 50 MG tablet    Sig: Take 0.5-1 tablets (25-50 mg total) by mouth at bedtime as needed for sleep.    Dispense:  30 tablet    Refill:  1    Mail/deliver to patient home   spironolactone (ALDACTONE) 25 MG tablet    Sig: Take 1 tablet (25 mg total) by mouth daily.    Dispense:  90 tablet    Refill:  1    Mail/deliver to patient home   Blood Pressure Monitoring (BLOOD PRESSURE KIT) DEVI    Sig: Use to monitor blood pressure    Dispense:  1 each    Refill:  0   Cologuard will be ordered for colon cancer screen Follow-up:  Return 6 weeks This encounter took 59mn with complex MDM  PAsencion Noble

## 2021-06-01 NOTE — Assessment & Plan Note (Addendum)
Continue gabapentin TID for post-CVA neuropathy. Patient also uses marijuana.

## 2021-06-05 ENCOUNTER — Other Ambulatory Visit: Payer: Self-pay

## 2021-06-05 ENCOUNTER — Telehealth: Payer: Self-pay

## 2021-06-05 NOTE — Telephone Encounter (Signed)
Referral received for home health PT and faxed to [facility] for review.

## 2021-06-08 NOTE — Telephone Encounter (Signed)
Call placed to [facility], spoke to [place] who said that the referral has been rejected.  Call placed to [facility], spoke to [place] who requested the referral be faxed for review. Referral then faxed to # [phone].  Call placed to [facility], spoke to [place] who requested referral be faxed for review. She said that if accepted, the start of care would not be until next week. Referral then faxed to # [phone] for review.

## 2021-06-12 NOTE — Telephone Encounter (Signed)
Call placed to [facility], spoke to [facility] who said that they are not in network with patient's insurance and are not able to accept the referral.  Call placed to [facility], spoke to [place] and obtained another fax # [phone] and referral then faxed to that number.  Call placed to [facility] # [phone], spoke to [facility] who said that they are not able to staff the referral.  Call placed to [facility] # [phone], spoke to [facility] who was not familiar with the insurance and requested that the referral be faxed to # [phone] for review.  Referral then faxed as requested.

## 2021-06-13 NOTE — Telephone Encounter (Signed)
Message received from [facility] stating that they are not able to accept the referral as they are out of network with his insurance.

## 2021-06-14 ENCOUNTER — Other Ambulatory Visit (HOSPITAL_COMMUNITY): Payer: Self-pay

## 2021-06-14 NOTE — Telephone Encounter (Signed)
Call placed to [facility], spoke to [place] who could not find the referral. It was then re-faxed to # [phone] as she requested.    This CM spoke to Amy/[facility]  Director of Healthcare Services and she requested the referral be faxed to # [phone] for review.  Referral then faxed as requested.

## 2021-06-15 ENCOUNTER — Other Ambulatory Visit: Payer: Self-pay | Admitting: *Deleted

## 2021-06-15 ENCOUNTER — Ambulatory Visit: Payer: Self-pay

## 2021-06-15 ENCOUNTER — Telehealth: Payer: Self-pay | Admitting: Critical Care Medicine

## 2021-06-15 ENCOUNTER — Other Ambulatory Visit: Payer: Self-pay

## 2021-06-15 DIAGNOSIS — I619 Nontraumatic intracerebral hemorrhage, unspecified: Secondary | ICD-10-CM

## 2021-06-15 DIAGNOSIS — Z8679 Personal history of other diseases of the circulatory system: Secondary | ICD-10-CM

## 2021-06-15 DIAGNOSIS — G8110 Spastic hemiplegia affecting unspecified side: Secondary | ICD-10-CM

## 2021-06-15 DIAGNOSIS — I6930 Unspecified sequelae of cerebral infarction: Secondary | ICD-10-CM

## 2021-06-15 DIAGNOSIS — I10 Essential (primary) hypertension: Secondary | ICD-10-CM

## 2021-06-15 MED ORDER — LABETALOL HCL 300 MG PO TABS
ORAL_TABLET | ORAL | 2 refills | Status: DC
  Filled 2021-06-15: qty 90, 30d supply, fill #0
  Filled 2021-06-15: qty 30, fill #0

## 2021-06-15 MED ORDER — HYDRALAZINE HCL 50 MG PO TABS
ORAL_TABLET | Freq: Three times a day (TID) | ORAL | 2 refills | Status: DC
  Filled 2021-06-15: qty 30, 10d supply, fill #0
  Filled 2021-06-27: qty 30, 30d supply, fill #1
  Filled 2021-06-29: qty 30, 10d supply, fill #1
  Filled 2021-06-29: qty 60, 20d supply, fill #1
  Filled 2021-07-05 – 2021-07-06 (×2): qty 30, 10d supply, fill #2

## 2021-06-15 NOTE — Telephone Encounter (Addendum)
Call placed to [facility] # [phone], spoke to Clydie Braun who said they are not in network with patient's insurance.   Call placed to [facility] # [phone], spoke to [facility] , provided her with patient's name/DOB. She said they have access to Epic and will check insurance to make sure they are in network and will contact this CM after insurance verification.   Call placed to [facility] # [phone], spoke to [place] who stated that they are not in network with the insurance.  Call placed to [facility] # [phone], spoke to [place] who said they are not in network with patient's insurance.

## 2021-06-15 NOTE — Telephone Encounter (Signed)
Spoke to patient and wife. States she had not been giving medication as directed. Lisinopril.  He has only taken 1 pill daily.  A refill request on Hydralyzine and Labetalol since mail in Rx did not arrive. Advised to call office with readings to ensure BP was going WNL.  Verbalized understanding.

## 2021-06-15 NOTE — Telephone Encounter (Signed)
Call received from Jason Romero/[facility] who explained that they are not able to accept the referral because they are out of network with patient's insurance.

## 2021-06-15 NOTE — Telephone Encounter (Signed)
Answer Assessment - Initial Assessment Questions 1. BLOOD PRESSURE: "What is the blood pressure?" "Did you take at least two measurements 5 minutes apart?"     192/117 2. ONSET: "When did you take your blood pressure?"     This morning 3. HOW: "How did you obtain the blood pressure?" (e.g., visiting nurse, automatic home BP monitor)     Home cuff 4. HISTORY: "Do you have a history of high blood pressure?"     Yes 5. MEDICATIONS: "Are you taking any medications for blood pressure?" "Have you missed any doses recently?"     Yes 6. OTHER SYMPTOMS: "Do you have any symptoms?" (e.g., headache, chest pain, blurred vision, difficulty breathing, weakness)     No 7. PREGNANCY: "Is there any chance you are pregnant?" "When was your last menstrual period?"     N/A  Protocols used: Blood Pressure - High-A-AH

## 2021-06-15 NOTE — Telephone Encounter (Signed)
[name] with [facility] is calling to notify Dr. [name] that not able to accept to accept the pt due to staffing.  CB- [phone]

## 2021-06-15 NOTE — Telephone Encounter (Signed)
Pt. And wife report BP up this morning. 192/117. Has not been taking his Lisinopril twice a day, only once daily. Took medications and BP coming down - 180/130. No symptoms. On hold with the practice 13 minutes, no response. Please advise pt. Instructed to call 911 for any symptoms. Also needs refills on labetalol and hydralazine. Please advise pt.

## 2021-06-16 ENCOUNTER — Other Ambulatory Visit: Payer: Self-pay

## 2021-06-16 NOTE — Telephone Encounter (Signed)
FYI

## 2021-06-16 NOTE — Telephone Encounter (Signed)
Erskine Squibb, any other options

## 2021-06-19 ENCOUNTER — Ambulatory Visit: Payer: Self-pay | Admitting: *Deleted

## 2021-06-19 NOTE — Telephone Encounter (Signed)
Patient's wife reporting Mr. Taha B/P today is much improved now 119/89. This is after he had begun taking his medications as prescribed.  TC-left VM to continue to monitor B/P and take medications and to call back with any concerns.  Reason for Disposition . [1] Follow-up call to recent contact AND [2] information only call, no triage required  Answer Assessment - Initial Assessment Questions 1. REASON FOR CALL or QUESTION: "What is your reason for calling today?" or "How can I best help you?" or "What question do you have that I can help answer?"     Reporting blood pressure has lowered after taking medications as prescribed.  Protocols used: Information Only Call - No Triage-A-AH

## 2021-06-19 NOTE — Telephone Encounter (Signed)
Jason Romero from office- call forwarded

## 2021-06-19 NOTE — Telephone Encounter (Signed)
Pts wife is calling stating that is missed a call. Ms. Erskine Squibb did you call the Pts wife? Please advise CB- [phone]

## 2021-06-19 NOTE — Telephone Encounter (Signed)
Call returned to patient's wife.  Message left with call back requested to this CM.  Need to informed her that 10 home health agencies were contacted and none were able to accept the referral. The next option is outpatient therapy if she and her husband are in agreement.  As long as he can get out of the house, transportation can be arranged through his insurance companies or [employer].   Also need to inquire if he has been assessed by [employer] for [employer].

## 2021-06-19 NOTE — Telephone Encounter (Addendum)
Let pt know that due to staffing will have to go with outpatient PT  Unfortunately this family lives in a mobile home and has Huge transportation barriers  wife does not drive nor does the patient and they had to have a friend bring them  Can we get [facility] to assist??  Outpt PT referral sent

## 2021-06-19 NOTE — Addendum Note (Signed)
Addended by: Storm Frisk on: 06/19/2021 09:53 AM   Modules accepted: Orders

## 2021-06-20 NOTE — Telephone Encounter (Signed)
Call placed to patient's wife.  Message left with call back requested to this CM.   Need to informed her that 10 home health agencies were contacted and none were able to accept the referral. The next option is outpatient therapy if she and her husband are in agreement.  As long as he can get out of the house, transportation can be arranged through his insurance companies or [employer].   Also need to inquire if he has been assessed by [employer] for [employer].

## 2021-06-21 ENCOUNTER — Telehealth: Payer: Self-pay | Admitting: Critical Care Medicine

## 2021-06-21 NOTE — Telephone Encounter (Signed)
Copied from CRM [id number]. Topic: General - Other >> Jun 19, 2021  8:55 AM Wyonia Hough E wrote: Reason for CRM: Pt has started taking medication correctly and his BP has come down a lot from last week/ BP was 119/89 this morning / Pt spouse was advise to call the nurse if she was able to get the BP down/ spouse asked for nurse to call her/ please advise

## 2021-06-22 NOTE — Telephone Encounter (Signed)
Routing to PCP

## 2021-06-22 NOTE — Telephone Encounter (Signed)
Let pt and spouse know excellent progress  No change in medications for now

## 2021-06-26 DIAGNOSIS — H524 Presbyopia: Secondary | ICD-10-CM | POA: Diagnosis not present

## 2021-06-27 ENCOUNTER — Telehealth: Payer: Self-pay | Admitting: Critical Care Medicine

## 2021-06-27 ENCOUNTER — Other Ambulatory Visit: Payer: Self-pay

## 2021-06-27 NOTE — Telephone Encounter (Signed)
Caller checking on the status of physical therapy orders due to patient having a stroke. Caller states a nurse has already came out but she would like a physical therapist to assess in home.

## 2021-06-27 NOTE — Telephone Encounter (Signed)
The dose ordered is the maximal safe dose I can Rx

## 2021-06-27 NOTE — Telephone Encounter (Signed)
Pt is requesting increase in Baclofen medication

## 2021-06-27 NOTE — Telephone Encounter (Signed)
Referral was placed on 06/19/2021 pt should receive a phone call.

## 2021-06-27 NOTE — Telephone Encounter (Signed)
Rx #: [id number]  baclofen (LIORESAL) 10 MG tablet [[id number]]  Requesting med refill and if possible an increase? Please advise and thank you Pharmacy  [facility]  [address], [place] [address]  Phone:  [phone]  Fax:  [phone]  DEA #:  [id number]

## 2021-06-28 ENCOUNTER — Other Ambulatory Visit: Payer: Self-pay

## 2021-06-28 NOTE — Telephone Encounter (Signed)
Pt was called and informed of medication having refills and to contact pharmacy for refills.

## 2021-06-29 ENCOUNTER — Other Ambulatory Visit: Payer: Self-pay

## 2021-06-29 NOTE — Telephone Encounter (Signed)
Call placed to patient's wife, [name],  and explained that 10 home health agencies were contacted and none were able to accept the referral for home PT. Dr [name] has placed a referral for outpatient PT as that is the only option at this time. She said that will be difficult because she will need to find someone to help her get him out of the house.  [facility] is available if she can get him out the door.  She said that the nurse came out from [facility] and assessed him and she is waiting on determination of hours.  This CM explained that she can possibly arrange outpatient PT at a time when the aide is there and can help get him out the door for his ride. [name] said she will need to see what she can do.

## 2021-07-04 ENCOUNTER — Ambulatory Visit (INDEPENDENT_AMBULATORY_CARE_PROVIDER_SITE_OTHER): Payer: 59 | Admitting: Pharmacist

## 2021-07-04 ENCOUNTER — Other Ambulatory Visit: Payer: Self-pay

## 2021-07-04 DIAGNOSIS — B182 Chronic viral hepatitis C: Secondary | ICD-10-CM

## 2021-07-04 NOTE — Progress Notes (Signed)
HPI: Jason Romero is a 50 y.o. male who presents to the [facility] for Hepatitis C follow-up.  Medication: Epclusa x 12 weeks  Start Date: 03/30/21  Hepatitis C Genotype: 1a  Fibrosis Score: F0/F1  Hepatitis C RNA: 529,000 (02/16/21) and undetectable (04/28/21)  Patient Active Problem List   Diagnosis Date Noted   Chronic hepatitis C without hepatic coma ([place]) 02/28/2021   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) 02/14/2021   Hyperlipidemia 02/14/2021   Visual changes 02/14/2021   Pain of right lower extremity 02/14/2021   Obesity 02/12/2021   Back pain 02/12/2021   Depression 01/26/2021   Left elbow pain 01/26/2021   Spastic hemiplegia affecting nondominant side ([place])    Dysphagia, post-stroke    Dysphagia due to recent stroke 09/20/2020   History of intracranial hemorrhage 09/20/2020   History of stroke with residual deficit 09/11/2020   Sleep apnea 09/06/2016   Tobacco abuse 09/06/2016   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   Unintentional weight loss 03/22/2016   Essential hypertension 02/12/2016   Anxiety 02/12/2016   Migraines 02/12/2016   COPD (chronic obstructive pulmonary disease) ([place]) 02/01/2016   Gastroesophageal reflux disease without esophagitis 02/01/2016   PAF (paroxysmal atrial fibrillation) ([place]) 02/01/2016    Patient's Medications  New Prescriptions   No medications on file  Previous Medications   ACETAMINOPHEN (TYLENOL) 325 MG TABLET    Take 2 tablets (650 mg total) by mouth every 4 (four) hours as needed for mild pain (or temp > 37.5 C (99.5 F)).   AMLODIPINE (NORVASC) 10 MG TABLET    TAKE 1 TABLET (10 MG TOTAL) BY MOUTH DAILY.   ATORVASTATIN (LIPITOR) 20 MG TABLET    HOLD UNTIL OFF EPCLUSA   BACLOFEN (LIORESAL) 10 MG TABLET    Take 1 tablet (10 mg total) by mouth 3 (three) times daily.   BLOOD PRESSURE MONITORING (BLOOD PRESSURE KIT) DEVI    Use to monitor blood pressure   GABAPENTIN (NEURONTIN) 300 MG CAPSULE     TAKE 1 CAPSULE AT 8 AM AND 4 PM AND 2 CAPSULES AT BEDTIME   HYDRALAZINE (APRESOLINE) 50 MG TABLET    Take 1 tablet  by mouth 3 (three) times daily.   LABETALOL (NORMODYNE) 300 MG TABLET    TAKE 1 TABLET (300 MG TOTAL) BY MOUTH EVERY 8 (EIGHT) HOURS.   LISINOPRIL (ZESTRIL) 40 MG TABLET    Take 1 tablet (40 mg total) by mouth 2 (two) times daily.   NICOTINE POLACRILEX (NICORETTE) 2 MG LOZENGE    Take 1 lozenge (2 mg total) by mouth as needed for smoking cessation.   PANTOPRAZOLE (PROTONIX) 40 MG TABLET    TAKE 1 TABLET (40 MG TOTAL) BY MOUTH DAILY.   SENNA (SENOKOT) 8.6 MG TABS TABLET    Take 1 tablet (8.6 mg total) by mouth 2 (two) times daily.   SOFOSBUVIR-VELPATASVIR (EPCLUSA) 400-100 MG TABS    Take 1 tablet by mouth daily.   SPIRONOLACTONE (ALDACTONE) 25 MG TABLET    Take 1 tablet (25 mg total) by mouth daily.   TRAZODONE (DESYREL) 50 MG TABLET    Take 0.5-1 tablets (25-50 mg total) by mouth at bedtime as needed for sleep.  Modified Medications   No medications on file  Discontinued Medications   No medications on file    Allergies: Allergies  Allergen Reactions   Hydrocodone Hives   Ibuprofen Other (See Comments)    Pt can't take this medication because it interacts with  other medications that he is taking.     Past Medical History: Past Medical History:  Diagnosis Date   A-fib ([place])    Chronic headaches    COPD (chronic obstructive pulmonary disease) ([facility])    GERD (gastroesophageal reflux disease)    History of blood transfusion    Hypertension    MI (myocardial infarction) ([place])    Murmur 02/12/2021   Seizures ([place])     Social History: Social History   Socioeconomic History   Marital status: Married    Spouse name: Not on file   Number of children: Not on file   Years of education: Not on file   Highest education level: Not on file  Occupational History   Not on file  Tobacco Use   Smoking status: Every Day    Packs/day: 1.00    Types: Cigarettes   Smokeless  tobacco: Never  Vaping Use   Vaping Use: Never used  Substance and Sexual Activity   Alcohol use: Not Currently    Alcohol/week: 0.0 standard drinks   Drug use: Yes    Types: Marijuana    Comment: OCC   Sexual activity: Not Currently  Other Topics Concern   Not on file  Social History Narrative   Left handed   Social Determinants of Health   Financial Resource Strain: Not on file  Food Insecurity: Not on file  Transportation Needs: Not on file  Physical Activity: Not on file  Stress: Not on file  Social Connections: Not on file    Labs: Hepatitis C Lab Results  Component Value Date   HCVGENOTYPE 1a 02/28/2021   HCVRNAPCRQN <15 04/28/2021   FIBROSTAGE F0-F1 02/28/2021   Hepatitis B Lab Results  Component Value Date   HEPBSAB NON-REACTIVE 02/28/2021   HEPBSAG NON-REACTIVE 02/28/2021   Hepatitis A No results found for: HAV HIV Lab Results  Component Value Date   HIV NON-REACTIVE 02/28/2021   HIV Non Reactive 09/11/2020   HIV NONREACTIVE 03/22/2016   Lab Results  Component Value Date   CREATININE 0.92 04/28/2021   CREATININE 0.93 11/09/2020   CREATININE 1.06 10/07/2020   CREATININE 1.10 09/21/2020   CREATININE 1.07 09/20/2020   Lab Results  Component Value Date   AST 10 04/28/2021   AST 14 02/28/2021   AST 11 02/16/2021   ALT 9 04/28/2021   ALT 14 02/28/2021   ALT 14 02/28/2021   INR 1.0 02/28/2021   INR 1.1 09/11/2020    Assessment: Jason Romero presents to clinic today for HCV follow-up. He completed his Epclusa without any adverse events or adherence issues. Will check HCV RNA today and follow-up with Greg in 12 weeks. Of note, he was holding his atorvastatin while on therapy, so counseled that he can reach out to his doctor to resume now.    Plan: Check HCV RNA Follow-up with Marya Amsler on 10/12/21 @ 4:15  Alfonse Spruce, PharmD, CPP Clinical Pharmacist Practitioner Infectious Diseases [facility] 07/04/2021,  10:31 AM

## 2021-07-05 ENCOUNTER — Other Ambulatory Visit: Payer: Self-pay

## 2021-07-06 ENCOUNTER — Other Ambulatory Visit: Payer: Self-pay

## 2021-07-06 LAB — HEPATITIS C RNA QUANTITATIVE
HCV Quantitative Log: <1.18 log IU/mL
HCV RNA, PCR, QN: <15 IU/mL

## 2021-07-13 ENCOUNTER — Other Ambulatory Visit: Payer: Self-pay

## 2021-07-13 ENCOUNTER — Ambulatory Visit: Payer: 59 | Attending: Critical Care Medicine | Admitting: Critical Care Medicine

## 2021-07-13 ENCOUNTER — Encounter: Payer: Self-pay | Admitting: Critical Care Medicine

## 2021-07-13 VITALS — BP 105/86

## 2021-07-13 DIAGNOSIS — I619 Nontraumatic intracerebral hemorrhage, unspecified: Secondary | ICD-10-CM

## 2021-07-13 DIAGNOSIS — B182 Chronic viral hepatitis C: Secondary | ICD-10-CM | POA: Diagnosis not present

## 2021-07-13 DIAGNOSIS — I10 Essential (primary) hypertension: Secondary | ICD-10-CM

## 2021-07-13 DIAGNOSIS — I69391 Dysphagia following cerebral infarction: Secondary | ICD-10-CM

## 2021-07-13 MED ORDER — LISINOPRIL 40 MG PO TABS
40.0000 mg | ORAL_TABLET | Freq: Every day | ORAL | 2 refills | Status: DC
  Filled 2021-07-13 – 2021-08-03 (×2): qty 90, 90d supply, fill #0

## 2021-07-13 MED ORDER — NICOTINE POLACRILEX 2 MG MT LOZG
2.0000 mg | LOZENGE | OROMUCOSAL | 0 refills | Status: DC | PRN
  Filled 2021-07-13: qty 100, fill #0

## 2021-07-13 MED ORDER — HYDRALAZINE HCL 50 MG PO TABS
ORAL_TABLET | Freq: Three times a day (TID) | ORAL | 2 refills | Status: DC
Start: 2021-07-13 — End: 2021-08-28
  Filled 2021-07-13: qty 270, 90d supply, fill #0
  Filled 2021-07-17: qty 270, 90d supply, fill #1

## 2021-07-13 MED ORDER — LABETALOL HCL 300 MG PO TABS
ORAL_TABLET | ORAL | 2 refills | Status: DC
  Filled 2021-07-13: qty 270, 90d supply, fill #0
  Filled 2021-07-17: qty 270, 90d supply, fill #1

## 2021-07-13 MED ORDER — PREGABALIN 150 MG PO CAPS
150.0000 mg | ORAL_CAPSULE | Freq: Two times a day (BID) | ORAL | 4 refills | Status: DC
  Filled 2021-07-13: qty 60, 30d supply, fill #0
  Filled 2021-08-17: qty 60, 30d supply, fill #1

## 2021-07-13 MED ORDER — ATORVASTATIN CALCIUM 20 MG PO TABS
20.0000 mg | ORAL_TABLET | Freq: Every day | ORAL | 1 refills | Status: DC
  Filled 2021-07-13: qty 90, 90d supply, fill #0

## 2021-07-13 MED ORDER — TRAZODONE HCL 100 MG PO TABS
100.0000 mg | ORAL_TABLET | Freq: Every evening | ORAL | 2 refills | Status: DC | PRN
  Filled 2021-07-13: qty 90, 90d supply, fill #0

## 2021-07-13 NOTE — Assessment & Plan Note (Signed)
Patient swallowing has improved

## 2021-07-13 NOTE — Progress Notes (Signed)
Established Patient Office Visit  Subjective:  Patient ID: Jason Romero, male    DOB: 09/07/1971  Age: 50 y.o. MRN: [id number] Virtual Visit via Telephone Note  I connected with [name] on 07/13/21 at 10:00 AM EDT by telephone and verified that I am speaking with the correct person using two identifiers.   Consent:  I discussed the limitations, risks, security and privacy concerns of performing an evaluation and management service by telephone and the availability of in person appointments. I also discussed with the patient that there may be a patient responsible charge related to this service. The patient expressed understanding and agreed to proceed.  Location of patient: Patient's at home  Location of provider: I am in my office  Persons participating in the televisit with the patient.   Patient's spouse participated in the visit    History of Present Illness:  CC: Primary care follow-up visit   HPI [name] presents for primary care follow-up visit by way of a phone visit.  He does not have technology for video. The patient's been compliant with his current program of amlodipine hydralazine labetalol lisinopril and the addition of spironolactone.  With these additions he has markedly improved his blood pressure control.  Today blood pressure was 105/86.  I do note that he was to have been increased from lisinopril 20 mg twice daily to a 40 mg tablet daily inadvertently he received 40 mg twice daily on his prescription unguinal have to adjust that going forward.  In any case the patient is doing very well with the exception he is smoking about 10 cigarettes a day.  He still has low back pain and was requesting opiates which we have declined previously.  The patient is not sleeping well on low-dose trazodone.  He does have personal care services coming out to the mobile home to assist him.  He is now off his Raeanne Gathers so can restart at atorvastatin.  He has been on  gabapentin with no relief.  He would like to receive the COVID booster shot and a flu vaccine if we get this the at homebound program   The patient did do colon cancer screening and has just recently mailed the kit and     Past Medical History:  Diagnosis Date   A-fib ([place])    Chronic headaches    COPD (chronic obstructive pulmonary disease) ([facility])    GERD (gastroesophageal reflux disease)    History of blood transfusion    Hypertension    MI (myocardial infarction) ([place])    Murmur 02/12/2021   Seizures ([place])     Past Surgical History:  Procedure Laterality Date   CHOLECYSTECTOMY      Family History  Problem Relation Age of Onset   Alcoholism Other    Arthritis Other    Breast cancer Other    Heart disease Mother     Social History   Socioeconomic History   Marital status: Married    Spouse name: Not on file   Number of children: Not on file   Years of education: Not on file   Highest education level: Not on file  Occupational History   Not on file  Tobacco Use   Smoking status: Every Day    Packs/day: 1.00    Types: Cigarettes   Smokeless tobacco: Never  Vaping Use   Vaping Use: Never used  Substance and Sexual Activity   Alcohol use: Not Currently    Alcohol/week: 0.0 standard  drinks   Drug use: Yes    Types: Marijuana    Comment: OCC   Sexual activity: Not Currently  Other Topics Concern   Not on file  Social History Narrative   Left handed   Social Determinants of Health   Financial Resource Strain: Not on file  Food Insecurity: Not on file  Transportation Needs: Not on file  Physical Activity: Not on file  Stress: Not on file  Social Connections: Not on file  Intimate Partner Violence: Not on file    Outpatient Medications Prior to Visit  Medication Sig Dispense Refill   acetaminophen (TYLENOL) 325 MG tablet Take 2 tablets (650 mg total) by mouth every 4 (four) hours as needed for mild pain (or temp > 37.5 C (99.5 F)).     amLODipine  (NORVASC) 10 MG tablet TAKE 1 TABLET (10 MG TOTAL) BY MOUTH DAILY. 90 tablet 1   baclofen (LIORESAL) 10 MG tablet Take 1 tablet (10 mg total) by mouth 3 (three) times daily. 90 each 5   Blood Pressure Monitoring (BLOOD PRESSURE KIT) DEVI Use to monitor blood pressure 1 each 0   pantoprazole (PROTONIX) 40 MG tablet TAKE 1 TABLET (40 MG TOTAL) BY MOUTH DAILY. 30 tablet 2   spironolactone (ALDACTONE) 25 MG tablet Take 1 tablet (25 mg total) by mouth daily. 90 tablet 1   atorvastatin (LIPITOR) 20 MG tablet HOLD UNTIL OFF EPCLUSA 60 tablet 1   gabapentin (NEURONTIN) 300 MG capsule TAKE 1 CAPSULE AT 8 AM AND 4 PM AND 2 CAPSULES AT BEDTIME 120 capsule 3   hydrALAZINE (APRESOLINE) 50 MG tablet Take 1 tablet  by mouth 3 (three) times daily. 30 tablet 2   labetalol (NORMODYNE) 300 MG tablet TAKE 1 TABLET (300 MG TOTAL) BY MOUTH EVERY 8 (EIGHT) HOURS. 30 tablet 2   lisinopril (ZESTRIL) 40 MG tablet Take 1 tablet (40 mg total) by mouth 2 (two) times daily. 90 tablet 2   nicotine polacrilex (NICORETTE) 2 MG lozenge Take 1 lozenge (2 mg total) by mouth as needed for smoking cessation. 100 lozenge 0   traZODone (DESYREL) 50 MG tablet Take 0.5-1 tablets (25-50 mg total) by mouth at bedtime as needed for sleep. 30 tablet 1   senna (SENOKOT) 8.6 MG TABS tablet Take 1 tablet (8.6 mg total) by mouth 2 (two) times daily. (Patient not taking: Reported on 07/13/2021) 120 tablet 0   No facility-administered medications prior to visit.    Allergies  Allergen Reactions   Hydrocodone Hives   Ibuprofen Other (See Comments)    Pt can't take this medication because it interacts with other medications that he is taking.     ROS Review of Systems  Constitutional:  Negative for chills, diaphoresis and fever.  HENT:  Negative for congestion, ear discharge, ear pain, hearing loss, nosebleeds, sore throat and tinnitus.   Eyes:  Negative for photophobia and discharge.  Respiratory:  Negative for cough, shortness of breath,  wheezing and stridor.        No excess mucus  Cardiovascular:  Negative for chest pain, palpitations and leg swelling.  Gastrointestinal:  Negative for abdominal pain, blood in stool, constipation, diarrhea, nausea and vomiting.  Endocrine: Negative for polydipsia.  Genitourinary:  Negative for dysuria, flank pain, frequency, hematuria and urgency.  Musculoskeletal:  Positive for back pain and gait problem. Negative for myalgias and neck pain.  Skin:  Negative for rash.  Allergic/Immunologic: Negative for environmental allergies.  Neurological:  Positive for weakness. Negative for dizziness,  tremors, seizures and headaches.  Hematological:  Does not bruise/bleed easily.  Psychiatric/Behavioral:  Negative for hallucinations and suicidal ideas. The patient is not nervous/anxious.   All other systems reviewed and are negative.    Objective:    Physical Exam No exam this is a phone visit BP 105/86  Wt Readings from Last 3 Encounters:  05/02/21 227 lb (103 kg)  10/19/20 227 lb 9.6 oz (103.2 kg)  09/20/20 240 lb 15.4 oz (109.3 kg)     Health Maintenance Due  Topic Date Due   COLON CANCER SCREENING ANNUAL FOBT  Never done   Zoster Vaccines- Shingrix (1 of 2) Never done   INFLUENZA VACCINE  06/12/2021   COVID-19 Vaccine (4 - Booster for Pfizer series) 07/01/2021   Pneumococcal Vaccine 20-29 Years old (2 - PCV) 09/15/2021    There are no preventive care reminders to display for this patient.  Lab Results  Component Value Date   TSH 0.64 03/22/2016   Lab Results  Component Value Date   WBC 8.2 02/28/2021   HGB 13.1 (L) 02/28/2021   HCT 41.3 02/28/2021   MCV 87.1 02/28/2021   PLT 362 02/28/2021   Lab Results  Component Value Date   NA 140 04/28/2021   K 4.1 04/28/2021   CO2 22 04/28/2021   GLUCOSE 90 04/28/2021   BUN 13 04/28/2021   CREATININE 0.92 04/28/2021   BILITOT 0.5 04/28/2021   ALKPHOS 82 02/16/2021   AST 10 04/28/2021   ALT 9 04/28/2021   PROT 7.7 04/28/2021    ALBUMIN 4.4 02/16/2021   CALCIUM 10.0 04/28/2021   ANIONGAP 13 10/07/2020   GFR 70.92 02/27/2017   Lab Results  Component Value Date   CHOL 141 02/13/2021   Lab Results  Component Value Date   HDL 39 (L) 02/13/2021   Lab Results  Component Value Date   LDLCALC 87 02/13/2021   Lab Results  Component Value Date   TRIG 77 02/13/2021   Lab Results  Component Value Date   CHOLHDL 3.6 02/13/2021   Lab Results  Component Value Date   HGBA1C 5.6 09/12/2020      Assessment & Plan:   Problem List Items Addressed This Visit       Cardiovascular and Mediastinum   Essential hypertension    Blood pressure under good control will adjust lisinopril to 40 mg daily and make no other changes refills obtained and sent to the patient by mail      Relevant Medications   atorvastatin (LIPITOR) 20 MG tablet   hydrALAZINE (APRESOLINE) 50 MG tablet   labetalol (NORMODYNE) 300 MG tablet   lisinopril (ZESTRIL) 40 MG tablet   Dysphagia due to recent stroke    Patient swallowing has improved      Relevant Medications   atorvastatin (LIPITOR) 20 MG tablet   hydrALAZINE (APRESOLINE) 50 MG tablet   labetalol (NORMODYNE) 300 MG tablet   lisinopril (ZESTRIL) 40 MG tablet     Digestive   Chronic hepatitis C without hepatic coma ([place])    Patient has resolved his hepatitis C symptoms and finishes Epclusa      Other Visit Diagnoses     Stroke, hemorrhagic ([place])       Relevant Medications   hydrALAZINE (APRESOLINE) 50 MG tablet   labetalol (NORMODYNE) 300 MG tablet   lisinopril (ZESTRIL) 40 MG tablet       Meds ordered this encounter  Medications   pregabalin (LYRICA) 150 MG capsule    Sig: Take  1 capsule (150 mg total) by mouth 2 (two) times daily.    Dispense:  60 capsule    Refill:  4    Mail to patient home, change in therapy, gabapentin discontinued   atorvastatin (LIPITOR) 20 MG tablet    Sig: Take 1 tablet (20 mg total) by mouth daily.    Dispense:  90 tablet     Refill:  1    Mail to patient home   hydrALAZINE (APRESOLINE) 50 MG tablet    Sig: Take 1 tablet  by mouth 3 (three) times daily.    Dispense:  180 tablet    Refill:  2    Mail to patient home, send now   labetalol (NORMODYNE) 300 MG tablet    Sig: TAKE 1 TABLET (300 MG TOTAL) BY MOUTH EVERY 8 (EIGHT) HOURS.    Dispense:  180 tablet    Refill:  2    Mail to patient home   lisinopril (ZESTRIL) 40 MG tablet    Sig: Take 1 tablet (40 mg total) by mouth daily.    Dispense:  90 tablet    Refill:  2    Mail/deliver to patient home   nicotine polacrilex (NICORETTE) 2 MG lozenge    Sig: Take 1 lozenge (2 mg total) by mouth as needed for smoking cessation.    Dispense:  100 lozenge    Refill:  0    Mail to patient home   traZODone (DESYREL) 100 MG tablet    Sig: Take 1 tablet (100 mg total) by mouth at bedtime as needed for sleep.    Dispense:  60 tablet    Refill:  2    Mail/deliver to patient home, change in dose    Follow-up: Return in about 7 weeks (around 08/30/2021).   Follow Up Instructions: Patient knows we will attempt to get a flu vaccine for him in the homebound nurse service refills on his medications were sent to our pharmacy we mailed to the home   I discussed the assessment and treatment plan with the patient. The patient was provided an opportunity to ask questions and all were answered. The patient agreed with the plan and demonstrated an understanding of the instructions.   The patient was advised to call back or seek an in-person evaluation if the symptoms worsen or if the condition fails to improve as anticipated.  I provided 31 minutes of non-face-to-face time during this encounter  including  median intraservice time , review of notes, labs, imaging, medications  and explaining diagnosis and management to the patient .     Asencion Noble, MD

## 2021-07-13 NOTE — Assessment & Plan Note (Signed)
Blood pressure under good control will adjust lisinopril to 40 mg daily and make no other changes refills obtained and sent to the patient by mail

## 2021-07-13 NOTE — Assessment & Plan Note (Signed)
Patient has resolved his hepatitis C symptoms and finishes [place]

## 2021-07-14 ENCOUNTER — Other Ambulatory Visit: Payer: Self-pay

## 2021-07-15 LAB — COLOGUARD: Cologuard: NEGATIVE

## 2021-07-18 ENCOUNTER — Other Ambulatory Visit: Payer: Self-pay

## 2021-07-19 ENCOUNTER — Telehealth: Payer: Self-pay

## 2021-07-19 DIAGNOSIS — I619 Nontraumatic intracerebral hemorrhage, unspecified: Secondary | ICD-10-CM | POA: Diagnosis not present

## 2021-07-19 NOTE — Telephone Encounter (Signed)
Contacted pt to go over lab results pt didn't answer lvm   Sent a CRM and forward labs to NT to give pt labs when they call back   

## 2021-07-19 NOTE — Telephone Encounter (Signed)
Pt has been scheduled and reminder has been mailed.  

## 2021-07-19 NOTE — Telephone Encounter (Signed)
-----   Message from Storm Frisk, MD sent at 07/13/2021 10:53 AM EDT ----- Pt needs phone visit third week October

## 2021-07-24 ENCOUNTER — Other Ambulatory Visit: Payer: Self-pay

## 2021-07-25 DIAGNOSIS — I619 Nontraumatic intracerebral hemorrhage, unspecified: Secondary | ICD-10-CM | POA: Diagnosis not present

## 2021-07-27 ENCOUNTER — Other Ambulatory Visit: Payer: Self-pay | Admitting: *Deleted

## 2021-07-28 ENCOUNTER — Other Ambulatory Visit: Payer: Self-pay

## 2021-08-02 DIAGNOSIS — I619 Nontraumatic intracerebral hemorrhage, unspecified: Secondary | ICD-10-CM | POA: Diagnosis not present

## 2021-08-03 ENCOUNTER — Other Ambulatory Visit: Payer: Self-pay

## 2021-08-07 DIAGNOSIS — I619 Nontraumatic intracerebral hemorrhage, unspecified: Secondary | ICD-10-CM | POA: Diagnosis not present

## 2021-08-17 ENCOUNTER — Other Ambulatory Visit: Payer: Self-pay

## 2021-08-18 ENCOUNTER — Other Ambulatory Visit: Payer: Self-pay

## 2021-08-21 DIAGNOSIS — I619 Nontraumatic intracerebral hemorrhage, unspecified: Secondary | ICD-10-CM | POA: Diagnosis not present

## 2021-08-22 DIAGNOSIS — I619 Nontraumatic intracerebral hemorrhage, unspecified: Secondary | ICD-10-CM | POA: Diagnosis not present

## 2021-08-27 NOTE — Progress Notes (Signed)
Established Patient Office Visit  Subjective:  Patient ID: Jason Romero, male    DOB: [birth year]  Age: 50 y.o. MRN: [id number] Virtual Visit via Telephone Note  I connected with Jason Romero on 08/28/21 at  9:00 AM EDT by telephone and verified that I am speaking with the correct person using two identifiers.   Consent:  I discussed the limitations, risks, security and privacy concerns of performing an evaluation and management service by telephone and the availability of in person appointments. I also discussed with the patient that there may be a patient responsible charge related to this service. The patient expressed understanding and agreed to proceed.  Location of patient: Patient's at home  Location of provider: I am in my office  Persons participating in the televisit with the patient.   Patient's spouse participated in the visit    History of Present Illness:  CC: Primary care follow-up visit   HPI 07/13/21 Jason Romero presents for primary care follow-up visit by way of a phone visit.  He does not have technology for video. The patient's been compliant with his current program of amlodipine hydralazine labetalol lisinopril and the addition of spironolactone.  With these additions he has markedly improved his blood pressure control.  Today blood pressure was 105/86.  I do note that he was to have been increased from lisinopril 20 mg twice daily to a 40 mg tablet daily inadvertently he received 40 mg twice daily on his prescription unguinal have to adjust that going forward.  In any case the patient is doing very well with the exception he is smoking about 10 cigarettes a day.  He still has low back pain and was requesting opiates which we have declined previously.  The patient is not sleeping well on low-dose trazodone.  He does have personal care services coming out to the mobile home to assist him.  He is now off his Raeanne Gathers so can restart at atorvastatin.  He has been  on gabapentin with no relief.  He would like to receive the COVID booster shot and a flu vaccine if we get this the at homebound program   The patient did do colon cancer screening and has just recently mailed the kit and    10/17 The patient is seen today by way of a phone visit.  Patient states his blood pressure is good today he just took a 228/87 pulse 67.  He is still smoking half pack a day of cigarettes.  He is interested in something for anxiety.  He is still homebound at this time.  He does need a flu vaccine.  Wife is on the call with him and has no other complaints at this time.  He has finished his [place].  He is now back on atorvastatin.  He was not able to receive the COVID booster shot as of yet.  Past Medical History:  Diagnosis Date   A-fib [facility])    Chronic headaches    COPD (chronic obstructive pulmonary disease) (HCC)    GERD (gastroesophageal reflux disease)    History of blood transfusion    Hypertension    MI (myocardial infarction) ([place])    Murmur 02/12/2021   Seizures (HCC)     Past Surgical History:  Procedure Laterality Date   CHOLECYSTECTOMY      Family History  Problem Relation Age of Onset   Alcoholism Other    Arthritis Other    Breast cancer Other    Heart  disease Mother     Social History   Socioeconomic History   Marital status: Married    Spouse name: Not on file   Number of children: Not on file   Years of education: Not on file   Highest education level: Not on file  Occupational History   Not on file  Tobacco Use   Smoking status: Every Day    Packs/day: 1.00    Types: Cigarettes   Smokeless tobacco: Never  Vaping Use   Vaping Use: Never used  Substance and Sexual Activity   Alcohol use: Not Currently    Alcohol/week: 0.0 standard drinks   Drug use: Yes    Types: Marijuana    Comment: OCC   Sexual activity: Not Currently  Other Topics Concern   Not on file  Social History Narrative   Left handed   Social Determinants  of Health   Financial Resource Strain: Not on file  Food Insecurity: Not on file  Transportation Needs: Not on file  Physical Activity: Not on file  Stress: Not on file  Social Connections: Not on file  Intimate Partner Violence: Not on file    Outpatient Medications Prior to Visit  Medication Sig Dispense Refill   acetaminophen (TYLENOL) 325 MG tablet Take 2 tablets (650 mg total) by mouth every 4 (four) hours as needed for mild pain (or temp > 37.5 C (99.5 F)).     Blood Pressure Monitoring (BLOOD PRESSURE KIT) DEVI Use to monitor blood pressure 1 each 0   nicotine polacrilex (NICORETTE) 2 MG lozenge Take 1 lozenge (2 mg total) by mouth as needed for smoking cessation. 100 lozenge 0   senna (SENOKOT) 8.6 MG TABS tablet Take 1 tablet (8.6 mg total) by mouth 2 (two) times daily. 120 tablet 0   amLODipine (NORVASC) 10 MG tablet TAKE 1 TABLET (10 MG TOTAL) BY MOUTH DAILY. 90 tablet 1   atorvastatin (LIPITOR) 20 MG tablet Take 1 tablet (20 mg total) by mouth daily. 90 tablet 1   baclofen (LIORESAL) 10 MG tablet Take 1 tablet (10 mg total) by mouth 3 (three) times daily. 90 each 5   hydrALAZINE (APRESOLINE) 50 MG tablet Take 1 tablet  by mouth 3 (three) times daily. 180 tablet 2   labetalol (NORMODYNE) 300 MG tablet TAKE 1 TABLET (300 MG TOTAL) BY MOUTH EVERY 8 (EIGHT) HOURS. 180 tablet 2   lisinopril (ZESTRIL) 40 MG tablet Take 1 tablet (40 mg total) by mouth daily. 90 tablet 2   pantoprazole (PROTONIX) 40 MG tablet TAKE 1 TABLET (40 MG TOTAL) BY MOUTH DAILY. 30 tablet 2   pregabalin (LYRICA) 150 MG capsule Take 1 capsule (150 mg total) by mouth 2 (two) times daily. 60 capsule 4   spironolactone (ALDACTONE) 25 MG tablet Take 1 tablet (25 mg total) by mouth daily. 90 tablet 1   traZODone (DESYREL) 100 MG tablet Take 1 tablet (100 mg total) by mouth at bedtime as needed for sleep. 60 tablet 2   No facility-administered medications prior to visit.    Allergies  Allergen Reactions    Hydrocodone Hives   Ibuprofen Other (See Comments)    Pt can't take this medication because it interacts with other medications that he is taking.     ROS Review of Systems  Constitutional:  Negative for chills, diaphoresis and fever.  HENT:  Negative for congestion, ear discharge, ear pain, hearing loss, nosebleeds, sore throat and tinnitus.   Eyes:  Negative for photophobia and discharge.  Respiratory:  Negative for cough, shortness of breath, wheezing and stridor.        No excess mucus  Cardiovascular:  Negative for chest pain, palpitations and leg swelling.  Gastrointestinal:  Negative for abdominal pain, blood in stool, constipation, diarrhea, nausea and vomiting.  Endocrine: Negative for polydipsia.  Genitourinary:  Negative for dysuria, flank pain, frequency, hematuria and urgency.  Musculoskeletal:  Positive for back pain and gait problem. Negative for myalgias and neck pain.  Skin:  Negative for rash.  Allergic/Immunologic: Negative for environmental allergies.  Neurological:  Positive for weakness. Negative for dizziness, tremors, seizures and headaches.  Hematological:  Does not bruise/bleed easily.  Psychiatric/Behavioral:  Negative for hallucinations and suicidal ideas. The patient is not nervous/anxious.   All other systems reviewed and are negative.    Objective:    Physical Exam No exam this is a phone visit There were no vitals taken for this visit. Wt Readings from Last 3 Encounters:  05/02/21 227 lb (103 kg)  10/19/20 227 lb 9.6 oz (103.2 kg)  09/20/20 240 lb 15.4 oz (109.3 kg)     Health Maintenance Due  Topic Date Due   Zoster Vaccines- Shingrix (1 of 2) Never done   INFLUENZA VACCINE  06/12/2021    There are no preventive care reminders to display for this patient.  Lab Results  Component Value Date   TSH 0.64 03/22/2016   Lab Results  Component Value Date   WBC 8.2 02/28/2021   HGB 13.1 (L) 02/28/2021   HCT 41.3 02/28/2021   MCV 87.1  02/28/2021   PLT 362 02/28/2021   Lab Results  Component Value Date   NA 140 04/28/2021   K 4.1 04/28/2021   CO2 22 04/28/2021   GLUCOSE 90 04/28/2021   BUN 13 04/28/2021   CREATININE 0.92 04/28/2021   BILITOT 0.5 04/28/2021   ALKPHOS 82 02/16/2021   AST 10 04/28/2021   ALT 9 04/28/2021   PROT 7.7 04/28/2021   ALBUMIN 4.4 02/16/2021   CALCIUM 10.0 04/28/2021   ANIONGAP 13 10/07/2020   GFR 70.92 02/27/2017   Lab Results  Component Value Date   CHOL 141 02/13/2021   Lab Results  Component Value Date   HDL 39 (L) 02/13/2021   Lab Results  Component Value Date   LDLCALC 87 02/13/2021   Lab Results  Component Value Date   TRIG 77 02/13/2021   Lab Results  Component Value Date   CHOLHDL 3.6 02/13/2021   Lab Results  Component Value Date   HGBA1C 5.6 09/12/2020      Assessment & Plan:   Problem List Items Addressed This Visit       Cardiovascular and Mediastinum   Essential hypertension - Primary    Patient under excellent control we will not make any changes in current medications      Relevant Medications   amLODipine (NORVASC) 10 MG tablet   atorvastatin (LIPITOR) 20 MG tablet   hydrALAZINE (APRESOLINE) 50 MG tablet   labetalol (NORMODYNE) 300 MG tablet   lisinopril (ZESTRIL) 40 MG tablet   spironolactone (ALDACTONE) 25 MG tablet     Respiratory   COPD (chronic obstructive pulmonary disease) (HCC)    Stable on current inhaled medications        Digestive   Chronic hepatitis C without hepatic coma ([place])    This is resolved with Epclusa therapy        Other   Tobacco abuse       Current smoking  consumption amount: 1/2 pack a day  Dicsussion on advise to quit smoking and smoking impacts: Cardiovascular impacts  Patient's willingness to quit: Not ready to quit  Methods to quit smoking discussed: Nicotine replacement  Medication management of smoking session drugs discussed: Nicotine replacement  Resources provided:  AVS   Setting  quit date not established  Follow-up arranged 6 weeks   Time spent counseling the patient: 5 minutes       Other Visit Diagnoses     Stroke, hemorrhagic ([place])       Relevant Medications   amLODipine (NORVASC) 10 MG tablet   hydrALAZINE (APRESOLINE) 50 MG tablet   labetalol (NORMODYNE) 300 MG tablet   lisinopril (ZESTRIL) 40 MG tablet      Meds ordered this encounter  Medications   hydrOXYzine (VISTARIL) 25 MG capsule    Sig: Take 1 capsule (25 mg total) by mouth every 8 (eight) hours as needed for anxiety.    Dispense:  60 capsule    Refill:  2    Mail to pt home   amLODipine (NORVASC) 10 MG tablet    Sig: TAKE 1 TABLET (10 MG TOTAL) BY MOUTH DAILY.    Dispense:  90 tablet    Refill:  1    Mail/deliver to patient home   atorvastatin (LIPITOR) 20 MG tablet    Sig: Take 1 tablet (20 mg total) by mouth daily.    Dispense:  90 tablet    Refill:  1    Mail to patient home   baclofen (LIORESAL) 10 MG tablet    Sig: Take 1 tablet (10 mg total) by mouth 3 (three) times daily.    Dispense:  90 each    Refill:  5    Mail/deliver to patient home   hydrALAZINE (APRESOLINE) 50 MG tablet    Sig: Take 1 tablet  by mouth 3 (three) times daily.    Dispense:  180 tablet    Refill:  2    Mail to patient home, send now   labetalol (NORMODYNE) 300 MG tablet    Sig: TAKE 1 TABLET (300 MG TOTAL) BY MOUTH EVERY 8 (EIGHT) HOURS.    Dispense:  180 tablet    Refill:  2    Mail to patient home   lisinopril (ZESTRIL) 40 MG tablet    Sig: Take 1 tablet (40 mg total) by mouth daily.    Dispense:  90 tablet    Refill:  2    Mail/deliver to patient home   pantoprazole (PROTONIX) 40 MG tablet    Sig: TAKE 1 TABLET (40 MG TOTAL) BY MOUTH DAILY.    Dispense:  30 tablet    Refill:  2    Mail/deliver to patient home   pregabalin (LYRICA) 150 MG capsule    Sig: Take 1 capsule (150 mg total) by mouth 2 (two) times daily.    Dispense:  60 capsule    Refill:  4    Mail to patient home,  change in therapy, gabapentin discontinued   spironolactone (ALDACTONE) 25 MG tablet    Sig: Take 1 tablet (25 mg total) by mouth daily.    Dispense:  90 tablet    Refill:  1    Mail/deliver to patient home   traZODone (DESYREL) 100 MG tablet    Sig: Take 1 tablet (100 mg total) by mouth at bedtime as needed for sleep.    Dispense:  60 tablet    Refill:  2  Mail/deliver to patient home, change in dose     Follow-up: Return in about 6 weeks (around 10/09/2021).   Follow Up Instructions: Begin trial of hydroxyzine 3 times daily as needed for anxiety and will look for a housecall in November  I discussed the assessment and treatment plan with the patient. The patient was provided an opportunity to ask questions and all were answered. The patient agreed with the plan and demonstrated an understanding of the instructions.   The patient was advised to call back or seek an in-person evaluation if the symptoms worsen or if the condition fails to improve as anticipated.  I provided 31 minutes of non-face-to-face time during this encounter  including  median intraservice time , review of notes, labs, imaging, medications  and explaining diagnosis and management to the patient .     Asencion Noble, MD

## 2021-08-28 ENCOUNTER — Encounter: Payer: Self-pay | Admitting: Critical Care Medicine

## 2021-08-28 ENCOUNTER — Ambulatory Visit: Payer: 59 | Attending: Critical Care Medicine | Admitting: Critical Care Medicine

## 2021-08-28 ENCOUNTER — Other Ambulatory Visit: Payer: Self-pay

## 2021-08-28 VITALS — BP 128/87 | HR 67

## 2021-08-28 DIAGNOSIS — J432 Centrilobular emphysema: Secondary | ICD-10-CM

## 2021-08-28 DIAGNOSIS — I619 Nontraumatic intracerebral hemorrhage, unspecified: Secondary | ICD-10-CM

## 2021-08-28 DIAGNOSIS — Z720 Tobacco use: Secondary | ICD-10-CM

## 2021-08-28 DIAGNOSIS — I10 Essential (primary) hypertension: Secondary | ICD-10-CM

## 2021-08-28 DIAGNOSIS — B182 Chronic viral hepatitis C: Secondary | ICD-10-CM | POA: Diagnosis not present

## 2021-08-28 MED ORDER — HYDROXYZINE PAMOATE 25 MG PO CAPS
25.0000 mg | ORAL_CAPSULE | Freq: Three times a day (TID) | ORAL | 2 refills | Status: DC | PRN
  Filled 2021-08-28: qty 60, 20d supply, fill #0
  Filled 2021-09-18: qty 60, 20d supply, fill #1

## 2021-08-28 MED ORDER — BACLOFEN 10 MG PO TABS
10.0000 mg | ORAL_TABLET | Freq: Three times a day (TID) | ORAL | 5 refills | Status: DC
  Filled 2021-08-28: qty 90, 30d supply, fill #0
  Filled 2021-09-28: qty 90, 30d supply, fill #1
  Filled 2021-10-27: qty 90, 30d supply, fill #2
  Filled 2021-11-30 – 2022-01-01 (×2): qty 90, 30d supply, fill #0
  Filled 2022-01-01: qty 90, 30d supply, fill #1

## 2021-08-28 MED ORDER — PANTOPRAZOLE SODIUM 40 MG PO TBEC
DELAYED_RELEASE_TABLET | Freq: Every day | ORAL | 2 refills | Status: DC
  Filled 2021-08-28: qty 90, 90d supply, fill #0

## 2021-08-28 MED ORDER — PREGABALIN 150 MG PO CAPS
150.0000 mg | ORAL_CAPSULE | Freq: Two times a day (BID) | ORAL | 4 refills | Status: DC
  Filled 2021-08-28 – 2021-09-18 (×2): qty 60, 30d supply, fill #0
  Filled 2021-10-16: qty 60, 30d supply, fill #1
  Filled 2021-11-20: qty 60, 30d supply, fill #0
  Filled 2021-11-20: qty 60, 30d supply, fill #2
  Filled 2021-12-25: qty 60, 30d supply, fill #1

## 2021-08-28 MED ORDER — HYDRALAZINE HCL 50 MG PO TABS
ORAL_TABLET | Freq: Three times a day (TID) | ORAL | 2 refills | Status: DC
  Filled 2021-08-28: qty 180, fill #0
  Filled 2021-10-16: qty 180, 90d supply, fill #0
  Filled 2021-12-12: qty 180, 90d supply, fill #1
  Filled 2021-12-12: qty 180, 60d supply, fill #0

## 2021-08-28 MED ORDER — LISINOPRIL 40 MG PO TABS
40.0000 mg | ORAL_TABLET | Freq: Every day | ORAL | 2 refills | Status: DC
  Filled 2021-08-28 – 2021-10-27 (×2): qty 90, 90d supply, fill #0

## 2021-08-28 MED ORDER — LABETALOL HCL 300 MG PO TABS
ORAL_TABLET | ORAL | 2 refills | Status: DC
  Filled 2021-08-28: qty 180, fill #0
  Filled 2021-10-16: qty 180, 90d supply, fill #0
  Filled 2021-12-12: qty 180, 90d supply, fill #1
  Filled 2021-12-12: qty 180, 90d supply, fill #0

## 2021-08-28 MED ORDER — ATORVASTATIN CALCIUM 20 MG PO TABS
20.0000 mg | ORAL_TABLET | Freq: Every day | ORAL | 1 refills | Status: DC
  Filled 2021-08-28 – 2021-10-16 (×2): qty 90, 90d supply, fill #0

## 2021-08-28 MED ORDER — SPIRONOLACTONE 25 MG PO TABS
25.0000 mg | ORAL_TABLET | Freq: Every day | ORAL | 1 refills | Status: DC
  Filled 2021-08-28: qty 90, 90d supply, fill #0
  Filled 2021-12-25: qty 90, 90d supply, fill #1
  Filled 2021-12-25: qty 90, 90d supply, fill #0

## 2021-08-28 MED ORDER — TRAZODONE HCL 100 MG PO TABS
100.0000 mg | ORAL_TABLET | Freq: Every evening | ORAL | 2 refills | Status: DC | PRN
  Filled 2021-08-28 – 2021-12-12 (×3): qty 60, 60d supply, fill #0
  Filled 2021-12-12: qty 90, 90d supply, fill #0
  Filled 2021-12-12: qty 60, 60d supply, fill #1

## 2021-08-28 MED ORDER — AMLODIPINE BESYLATE 10 MG PO TABS
ORAL_TABLET | Freq: Every day | ORAL | 1 refills | Status: DC
  Filled 2021-08-28 – 2021-11-27 (×2): qty 90, 90d supply, fill #0
  Filled 2021-11-27: qty 90, 90d supply, fill #1

## 2021-08-28 NOTE — Assessment & Plan Note (Signed)
  .   Current smoking consumption amount: 1/2 pack a day  . Dicsussion on advise to quit smoking and smoking impacts: Cardiovascular impacts  . Patient's willingness to quit: Not ready to quit  . Methods to quit smoking discussed: Nicotine replacement  . Medication management of smoking session drugs discussed: Nicotine replacement  . Resources provided:  AVS   . Setting quit date not established  . Follow-up arranged 6 weeks   Time spent counseling the patient: 5 minutes

## 2021-08-28 NOTE — Assessment & Plan Note (Signed)
Stable on current inhaled medications

## 2021-08-28 NOTE — Assessment & Plan Note (Signed)
Patient under excellent control we will not make any changes in current medications

## 2021-08-28 NOTE — Assessment & Plan Note (Signed)
This is resolved with Epclusa therapy

## 2021-08-29 ENCOUNTER — Other Ambulatory Visit: Payer: Self-pay

## 2021-09-07 ENCOUNTER — Telehealth: Payer: Self-pay | Admitting: *Deleted

## 2021-09-07 DIAGNOSIS — G8110 Spastic hemiplegia affecting unspecified side: Secondary | ICD-10-CM

## 2021-09-07 NOTE — Telephone Encounter (Signed)
Copied from CRM [id number]. Topic: General - Inquiry >> Sep 07, 2021 12:44 PM Daphine Deutscher D wrote: Reason for CRM: Pt 's wife called asking if they could get a referral for [facility] to get wipes and bed pads, supplies for her husband.  [facility] 's #  [phone]

## 2021-09-08 NOTE — Telephone Encounter (Signed)
Call returned to [facility] [phone] to clarify supplies needed , message left with call back requested to this CM

## 2021-09-12 NOTE — Telephone Encounter (Signed)
Attempted to call patient's wife, [name] at number provided, message left with call back requested. Call then placed patient, spoke to [place]. She is requesting an order for chux, wipes and a urinal be sent to [facility].  Explained to her that the insurance company may not cover those supplies.  The prescription can be send to [facility] and they will call her with cost/out of pocket expense.  She said she understood and also may try a local medical supply company.  She also confirmed that the patient is receiving PCS 3 hours/day x 5 days/week

## 2021-09-12 NOTE — Telephone Encounter (Signed)
Order produced 

## 2021-09-13 ENCOUNTER — Telehealth: Payer: Self-pay | Admitting: Critical Care Medicine

## 2021-09-13 NOTE — Telephone Encounter (Signed)
[place], from [facility], calling to check on the status of a fax that they sent over on 09/07/21. She states that the fax was a 3051 (PS) form that she is needing to have filled out and faxed back. She states that she will send another today. Please advise.       Fax# [phone]

## 2021-09-13 NOTE — Telephone Encounter (Signed)
Order for chux, urinal and wipes faxed to [facility]

## 2021-09-14 DIAGNOSIS — I619 Nontraumatic intracerebral hemorrhage, unspecified: Secondary | ICD-10-CM | POA: Diagnosis not present

## 2021-09-15 ENCOUNTER — Telehealth: Payer: Self-pay | Admitting: Critical Care Medicine

## 2021-09-15 DIAGNOSIS — I619 Nontraumatic intracerebral hemorrhage, unspecified: Secondary | ICD-10-CM | POA: Diagnosis not present

## 2021-09-15 NOTE — Telephone Encounter (Signed)
[facility] calling to confirm type of wheelchair pt needs. The code on authorization is 0007 This code is for heavyweight chair.  Clinical office notes say light weight/regular chair. They want to know which chair does the pt need?  Cb  [phone] Or fax back   [phone]  The code for light weight chair K0003  Case number [id number]

## 2021-09-18 ENCOUNTER — Other Ambulatory Visit: Payer: Self-pay

## 2021-09-18 NOTE — Telephone Encounter (Signed)
227 lbs should be okay for light weight.  I don't see an order for a wheelchair... am I missing it?  I can call [facility] an clarify what they need

## 2021-09-18 NOTE — Telephone Encounter (Signed)
Jason Romero , pls give me some guidance   He does weight 227 # so perhaps needs heavy weight chair??

## 2021-09-18 NOTE — Telephone Encounter (Signed)
Pt wheelchair order needs to be changed.

## 2021-09-19 ENCOUNTER — Other Ambulatory Visit: Payer: Self-pay

## 2021-09-19 ENCOUNTER — Other Ambulatory Visit: Payer: Self-pay | Admitting: Critical Care Medicine

## 2021-09-19 DIAGNOSIS — I69398 Other sequelae of cerebral infarction: Secondary | ICD-10-CM

## 2021-09-19 DIAGNOSIS — I619 Nontraumatic intracerebral hemorrhage, unspecified: Secondary | ICD-10-CM | POA: Diagnosis not present

## 2021-09-19 DIAGNOSIS — R269 Unspecified abnormalities of gait and mobility: Secondary | ICD-10-CM

## 2021-09-19 NOTE — Telephone Encounter (Signed)
We sent this some time ago

## 2021-09-19 NOTE — Telephone Encounter (Signed)
Call placed to Jason Romero/ [facility] # [phone] regarding fax received noting the request for the wheelchair was denied.  He was not able to provide any information and transferred the call to Utilization Management. This CM then spoke to Jason Romero who confirmed that the order has been closed because it was denied due to lack of information. A reconsideration can be requested. There was no information about who initially ordered the wheelchair.    Call placed to [facility], spoke to Jason Romero who stated that there is no new order for a wheelchair.  The initial order was 10/19/2020. She sent message to the authorization team requesting a call back to this CM with more information.   Call received from Jason Romero/ [facility] who had been working on this referral.  The CMN for the wheelchair was sent to Dr [name] for signature on 08/23/2021. This was for a re-certification.  The patient already has a wheelchair. The signed CMN was faxed back to [facility] on 08/31/2021. Explained to Jason that a denial was received from [facility]. [facility] did not receive a denial . Jason said that they should have received the denial and she requested this CM fax her the denial and she would follow up.  Copy of denial as well as signed CMN faxed to # [phone] to Jason's attention.

## 2021-09-20 DIAGNOSIS — I619 Nontraumatic intracerebral hemorrhage, unspecified: Secondary | ICD-10-CM | POA: Diagnosis not present

## 2021-09-21 ENCOUNTER — Other Ambulatory Visit: Payer: Self-pay

## 2021-09-21 ENCOUNTER — Encounter: Payer: Self-pay | Admitting: Critical Care Medicine

## 2021-09-21 ENCOUNTER — Telehealth: Payer: Self-pay

## 2021-09-21 ENCOUNTER — Ambulatory Visit: Payer: 59 | Attending: Critical Care Medicine | Admitting: Critical Care Medicine

## 2021-09-21 VITALS — BP 115/79 | HR 63

## 2021-09-21 DIAGNOSIS — F3289 Other specified depressive episodes: Secondary | ICD-10-CM

## 2021-09-21 DIAGNOSIS — K219 Gastro-esophageal reflux disease without esophagitis: Secondary | ICD-10-CM

## 2021-09-21 DIAGNOSIS — Z23 Encounter for immunization: Secondary | ICD-10-CM

## 2021-09-21 DIAGNOSIS — I619 Nontraumatic intracerebral hemorrhage, unspecified: Secondary | ICD-10-CM

## 2021-09-21 DIAGNOSIS — E782 Mixed hyperlipidemia: Secondary | ICD-10-CM

## 2021-09-21 DIAGNOSIS — J432 Centrilobular emphysema: Secondary | ICD-10-CM

## 2021-09-21 DIAGNOSIS — I480 Paroxysmal atrial fibrillation: Secondary | ICD-10-CM

## 2021-09-21 DIAGNOSIS — F17210 Nicotine dependence, cigarettes, uncomplicated: Secondary | ICD-10-CM

## 2021-09-21 DIAGNOSIS — G8110 Spastic hemiplegia affecting unspecified side: Secondary | ICD-10-CM

## 2021-09-21 DIAGNOSIS — G8929 Other chronic pain: Secondary | ICD-10-CM

## 2021-09-21 DIAGNOSIS — G4733 Obstructive sleep apnea (adult) (pediatric): Secondary | ICD-10-CM

## 2021-09-21 DIAGNOSIS — M545 Low back pain, unspecified: Secondary | ICD-10-CM

## 2021-09-21 DIAGNOSIS — I10 Essential (primary) hypertension: Secondary | ICD-10-CM

## 2021-09-21 DIAGNOSIS — I69391 Dysphagia following cerebral infarction: Secondary | ICD-10-CM

## 2021-09-21 DIAGNOSIS — Z720 Tobacco use: Secondary | ICD-10-CM

## 2021-09-21 DIAGNOSIS — Q649 Congenital malformation of urinary system, unspecified: Secondary | ICD-10-CM

## 2021-09-21 MED ORDER — SENNA 8.6 MG PO TABS
1.0000 | ORAL_TABLET | Freq: Two times a day (BID) | ORAL | 0 refills | Status: DC
  Filled 2021-09-21: qty 120, 60d supply, fill #0

## 2021-09-21 MED ORDER — PANTOPRAZOLE SODIUM 40 MG PO TBEC
DELAYED_RELEASE_TABLET | Freq: Every day | ORAL | 2 refills | Status: DC
  Filled 2021-09-21: qty 60, fill #0

## 2021-09-21 MED ORDER — HYDROXYZINE PAMOATE 50 MG PO CAPS
50.0000 mg | ORAL_CAPSULE | Freq: Three times a day (TID) | ORAL | 2 refills | Status: DC | PRN
  Filled 2021-09-21: qty 90, 30d supply, fill #0
  Filled 2021-10-25: qty 90, 30d supply, fill #1
  Filled 2021-11-27: qty 90, 30d supply, fill #0
  Filled 2021-11-27: qty 90, 30d supply, fill #2

## 2021-09-21 NOTE — Telephone Encounter (Signed)
Message received from Christy/ [facility] # [phone] stating that they are out network with patient's insurance [facility].  They need the denial from [facility] in order to bill his secondary insurance - Medicaid. She recommends contacting a supplier in network or sending order to [employer] # [phone] # [phone]. She said that he would need to pay our of pocket with [employer] but it may be cheaper than purchasing the supplies in a regular store.   Message sent to [facility] inquiring if they are in network with [employer] [facility]

## 2021-09-21 NOTE — Assessment & Plan Note (Signed)
Still actively smoking though not needing inhalers

## 2021-09-21 NOTE — Progress Notes (Addendum)
Established Patient HOME Visit  Subjective:  Patient ID: Jason Romero, male    DOB: 01/28/1971  Age: 50 y.o. MRN: [id number]  CC: post stroke f/u  HTN f/u  HOME VISIT  HPI [name] presents for seen at his home for a home visit face-to-face Note this patient lives in [place] area and I visited with both him and his wife in his mobile home [place] he [place].  He is at [employer].  The patient essentially is confined to the room.  He does not get out of the home because there is not adequate egress and ingress into the mobile home.  If there were a fire he would not be able to get out of his mobile home safely.  Patient essentially spends his life in his bedroom watching TV sitting on the side of the bed.  He is completely hemiparetic on the right side.  He is still smoking about a half a pack a day of cigarettes and knows to cut back.  We do have a blood pressure meter for him.  On arrival blood pressure is 115/79 pulse is 63.  Patient does need new supplies including a urinal chuck pads wipes and latex gloves.  It would be good if we can get this patient a ramp built for him to get out of the home safely with the current wheelchair he has he does have a new wheelchair coming.  Also he would be a good candidate for motorized wheelchair.    I reviewed the patient's medication bottles with him.  He is taking amlodipine 10 mg daily, Lipitor 20 mg daily, baclofen 3 times a day, hydralazine 50 mg 3 times daily, hydroxyzine 25 mg every 8 hours and he states he could use an increase on this, labetalol 300 mg 3 t he remains somewhat depressed over being homebound.  He is no longer drinking any alcohol.  I did bring to the patient a flu vaccine at this visit.  He has had all of his COVID vaccinations.  imes daily, lisinopril 40 mg daily, pantoprazole 40 mg daily, Lyrica 150 mg twice daily, Aldactone 25 mg daily, trazodone 100 mg at bedtime as needed for sleep.  Patient  does complain of some obstipation and did not receive the Senokot he was informed how to get this over-the-counter.  He did not use the nicotine lozenges as prescribed.  He does complain of some low back pain.  He does have a history of sleep apnea with positive sleep study in the past also need to be repeated if we want to assess him for CPAP.  He does urinate adequately.  He uses safe measures for his swallowing.  His reflux disease is stable.  No real change in his vision at this time. From a COPD perspective the patient's not using inhalers.  I have been in toured the small mobile home that he is living in.  Essentially has 4 rooms.  There is very steep stairs leading from the door down to the level ground.  There is plenty of space to build a ramp for this patient.  He is inquired about this has friends who will provide the labor but needs to cover the cost of the lumbar.  He will get a quote for the lumbar and we can see if we can cover this under the patient assistance fund  He is interested in obtaining a motorized wheelchair and he is getting a  new manual wheelchair  The flooring is sturdy and and there is plenty of room in the home where he could tolerate a motorized wheelchair if we get a ramp built  The family does not have a car they have friends they can come and pick him up and take him for rides but it would be nice that someday at some point to get this patient a motorized vehicle Past Medical History:  Diagnosis Date   A-fib ([place])    Chronic headaches    COPD (chronic obstructive pulmonary disease) (HCC)    GERD (gastroesophageal reflux disease)    History of blood transfusion    Hypertension    MI (myocardial infarction) ([place])    Murmur 02/12/2021   Seizures ([place])     Past Surgical History:  Procedure Laterality Date   CHOLECYSTECTOMY      Family History  Problem Relation Age of Onset   Alcoholism Other    Arthritis Other    Breast cancer Other    Heart disease  Mother     Social History   Socioeconomic History   Marital status: Married    Spouse name: Not on file   Number of children: Not on file   Years of education: Not on file   Highest education level: Not on file  Occupational History   Not on file  Tobacco Use   Smoking status: Every Day    Packs/day: 1.00    Types: Cigarettes   Smokeless tobacco: Never  Vaping Use   Vaping Use: Never used  Substance and Sexual Activity   Alcohol use: Not Currently    Alcohol/week: 0.0 standard drinks   Drug use: Yes    Types: Marijuana    Comment: OCC   Sexual activity: Not Currently  Other Topics Concern   Not on file  Social History Narrative   Left handed   Social Determinants of Health   Financial Resource Strain: Medium Risk   Difficulty of Paying Living Expenses: Somewhat hard  Food Insecurity: Not on file  Transportation Needs: Not on file  Physical Activity: Not on file  Stress: Not on file  Social Connections: Not on file  Intimate Partner Violence: Not on file    Outpatient Medications Prior to Visit  Medication Sig Dispense Refill   acetaminophen (TYLENOL) 325 MG tablet Take 2 tablets (650 mg total) by mouth every 4 (four) hours as needed for mild pain (or temp > 37.5 C (99.5 F)).     amLODipine (NORVASC) 10 MG tablet TAKE 1 TABLET (10 MG TOTAL) BY MOUTH DAILY. 90 tablet 1   atorvastatin (LIPITOR) 20 MG tablet Take 1 tablet (20 mg total) by mouth daily. 90 tablet 1   baclofen (LIORESAL) 10 MG tablet Take 1 tablet (10 mg total) by mouth 3 (three) times daily. 90 each 5   Blood Pressure Monitoring (BLOOD PRESSURE KIT) DEVI Use to monitor blood pressure 1 each 0   hydrALAZINE (APRESOLINE) 50 MG tablet Take 1 tablet  by mouth 3 (three) times daily. 180 tablet 2   labetalol (NORMODYNE) 300 MG tablet TAKE 1 TABLET (300 MG TOTAL) BY MOUTH EVERY 8 (EIGHT) HOURS. 180 tablet 2   lisinopril (ZESTRIL) 40 MG tablet Take 1 tablet (40 mg total) by mouth daily. 90 tablet 2    pregabalin (LYRICA) 150 MG capsule Take 1 capsule (150 mg total) by mouth 2 (two) times daily. 60 capsule 4   spironolactone (ALDACTONE) 25 MG tablet Take 1 tablet (25 mg total)  by mouth daily. 90 tablet 1   traZODone (DESYREL) 100 MG tablet Take 1 tablet (100 mg total) by mouth at bedtime as needed for sleep. 60 tablet 2   hydrOXYzine (VISTARIL) 25 MG capsule Take 1 capsule (25 mg total) by mouth every 8 (eight) hours as needed for anxiety. 60 capsule 2   nicotine polacrilex (NICORETTE) 2 MG lozenge Take 1 lozenge (2 mg total) by mouth as needed for smoking cessation. 100 lozenge 0   pantoprazole (PROTONIX) 40 MG tablet TAKE 1 TABLET (40 MG TOTAL) BY MOUTH DAILY. 30 tablet 2   senna (SENOKOT) 8.6 MG TABS tablet Take 1 tablet (8.6 mg total) by mouth 2 (two) times daily. 120 tablet 0   No facility-administered medications prior to visit.    Allergies  Allergen Reactions   Hydrocodone Hives   Ibuprofen Other (See Comments)    Pt can't take this medication because it interacts with other medications that he is taking.     ROS Review of Systems  Constitutional:  Positive for fatigue and unexpected weight change. Negative for fever.  HENT:  Positive for trouble swallowing. Negative for congestion, dental problem, drooling, ear discharge, ear pain, facial swelling, hearing loss, mouth sores, nosebleeds, postnasal drip, rhinorrhea, sinus pressure, sneezing, sore throat, tinnitus and voice change.   Eyes: Negative.  Negative for visual disturbance.  Respiratory: Negative.  Negative for apnea, cough, choking, chest tightness, shortness of breath, wheezing and stridor.   Cardiovascular: Negative.  Negative for chest pain, palpitations and leg swelling.  Gastrointestinal:  Positive for constipation. Negative for abdominal distention, abdominal pain, anal bleeding, blood in stool, diarrhea, nausea and vomiting.  Genitourinary: Negative.   Musculoskeletal:  Positive for back pain and gait problem.  Negative for arthralgias and myalgias.  Skin: Negative.  Negative for rash and wound.  Allergic/Immunologic: Negative.  Negative for environmental allergies and food allergies.  Neurological:  Positive for speech difficulty, weakness and numbness. Negative for dizziness, syncope and headaches.  Hematological: Negative.  Negative for adenopathy. Does not bruise/bleed easily.  Psychiatric/Behavioral:  Positive for dysphoric mood and sleep disturbance. Negative for agitation, behavioral problems, confusion, decreased concentration, self-injury and suicidal ideas. The patient is nervous/anxious.      Objective:    Physical Exam Vitals reviewed.  Constitutional:      Appearance: Normal appearance. He is well-developed. He is not diaphoretic.  HENT:     Head: Normocephalic and atraumatic.     Nose: Nose normal. No nasal deformity, septal deviation, mucosal edema or rhinorrhea.     Right Sinus: No maxillary sinus tenderness or frontal sinus tenderness.     Left Sinus: No maxillary sinus tenderness or frontal sinus tenderness.     Mouth/Throat:     Mouth: Mucous membranes are moist.     Pharynx: Oropharynx is clear. No oropharyngeal exudate.  Eyes:     General: No scleral icterus.    Conjunctiva/sclera: Conjunctivae normal.     Pupils: Pupils are equal, round, and reactive to light.  Neck:     Thyroid: No thyromegaly.     Vascular: No carotid bruit or JVD.     Trachea: Trachea normal. No tracheal tenderness or tracheal deviation.  Cardiovascular:     Rate and Rhythm: Normal rate and regular rhythm.     Chest Wall: PMI is not displaced.     Pulses: Normal pulses. No decreased pulses.     Heart sounds: Normal heart sounds, S1 normal and S2 normal. Heart sounds not distant. No murmur heard.  No systolic murmur is present.  No diastolic murmur is present.    No friction rub. No gallop. No S3 or S4 sounds.  Pulmonary:     Effort: Pulmonary effort is normal. No tachypnea, accessory muscle  usage or respiratory distress.     Breath sounds: Normal breath sounds. No stridor. No decreased breath sounds, wheezing, rhonchi or rales.  Chest:     Chest wall: No tenderness.  Abdominal:     General: Bowel sounds are normal. There is no distension.     Palpations: Abdomen is soft. Abdomen is not rigid.     Tenderness: There is no abdominal tenderness. There is no guarding or rebound.  Musculoskeletal:        General: Normal range of motion.     Cervical back: Normal range of motion and neck supple. No edema, erythema or rigidity. No muscular tenderness. Normal range of motion.  Lymphadenopathy:     Head:     Right side of head: No submental or submandibular adenopathy.     Left side of head: No submental or submandibular adenopathy.     Cervical: No cervical adenopathy.  Skin:    General: Skin is warm and dry.     Coloration: Skin is not pale.     Findings: No rash.     Nails: There is no clubbing.  Neurological:     Mental Status: He is alert and oriented to person, place, and time. Mental status is at baseline.     Cranial Nerves: No cranial nerve deficit.     Sensory: No sensory deficit.     Motor: Weakness and abnormal muscle tone present.     Coordination: Coordination abnormal.     Gait: Gait abnormal.     Deep Tendon Reflexes: Reflexes abnormal.     Reflex Scores:      Tricep reflexes are 0 on the right side.      Bicep reflexes are 0 on the right side.      Brachioradialis reflexes are 0 on the right side.      Patellar reflexes are 0 on the right side.      Achilles reflexes are 0 on the right side.    Comments: R LE weak RUE weak  Psychiatric:        Mood and Affect: Mood is not depressed. Affect is not blunt.        Speech: Speech normal.        Behavior: Behavior normal.        Thought Content: Thought content normal. Thought content does not include homicidal or suicidal ideation.        Cognition and Memory: Cognition and memory normal.        Judgment:  Judgment normal.    BP 115/79   Pulse 63  Wt Readings from Last 3 Encounters:  05/02/21 227 lb (103 kg)  10/19/20 227 lb 9.6 oz (103.2 kg)  09/20/20 240 lb 15.4 oz (109.3 kg)     Health Maintenance Due  Topic Date Due   Zoster Vaccines- Shingrix (1 of 2) Never done   COVID-19 Vaccine (4 - Booster for Pfizer series) 04/26/2021    There are no preventive care reminders to display for this patient.  Lab Results  Component Value Date   TSH 0.64 03/22/2016   Lab Results  Component Value Date   WBC 8.2 02/28/2021   HGB 13.1 (L) 02/28/2021   HCT 41.3 02/28/2021   MCV 87.1 02/28/2021  PLT 362 02/28/2021   Lab Results  Component Value Date   NA 140 04/28/2021   K 4.1 04/28/2021   CO2 22 04/28/2021   GLUCOSE 90 04/28/2021   BUN 13 04/28/2021   CREATININE 0.92 04/28/2021   BILITOT 0.5 04/28/2021   ALKPHOS 82 02/16/2021   AST 10 04/28/2021   ALT 9 04/28/2021   PROT 7.7 04/28/2021   ALBUMIN 4.4 02/16/2021   CALCIUM 10.0 04/28/2021   ANIONGAP 13 10/07/2020   GFR 70.92 02/27/2017   Lab Results  Component Value Date   CHOL 141 02/13/2021   Lab Results  Component Value Date   HDL 39 (L) 02/13/2021   Lab Results  Component Value Date   LDLCALC 87 02/13/2021   Lab Results  Component Value Date   TRIG 77 02/13/2021   Lab Results  Component Value Date   CHOLHDL 3.6 02/13/2021   Lab Results  Component Value Date   HGBA1C 5.6 09/12/2020      Assessment & Plan:   Problem List Items Addressed This Visit       Cardiovascular and Mediastinum   Essential hypertension - Primary    Blood pressure under excellent control at this time  No change in blood pressure medications refills given      Dysphagia due to recent stroke    Patient given some swallowing techniques      PAF (paroxysmal atrial fibrillation) (HCC)    Rate is controlled not recurrent        Respiratory   Sleep apnea    Would benefit from repeat testing  With Medicaid will use a  home sleep study      COPD (chronic obstructive pulmonary disease) ([place])    Still actively smoking though not needing inhalers        Digestive   Gastroesophageal reflux disease without esophagitis    Stable on proton pump inhibitor we will refill same      Relevant Medications   pantoprazole (PROTONIX) 40 MG tablet   senna (SENOKOT) 8.6 MG TABS tablet     Nervous and Auditory   Spastic hemiplegia affecting nondominant side ([place])    Wheelchair-bound homebound and even bedroom bound  Would benefit from a motorized wheelchair and a ramp built in the home  Would benefit from a hemi walker to assist in ambulation in the narrow walk areas of his mobile home      Relevant Orders   For home use only DME Other see comment     Genitourinary   Urinary anomaly    Obtain urinal for the patient      Relevant Orders   For home use only DME Other see comment     Other   Tobacco abuse       Current smoking consumption amount: 1/2 pack a day  Dicsussion on advise to quit smoking and smoking impacts: Cardiovascular impacts  Patient's willingness to quit: Not ready to quit  Methods to quit smoking discussed: Nicotine replacement  Medication management of smoking session drugs discussed: Nicotine replacement  Resources provided:  AVS   Setting quit date not established  Follow-up arranged 6 weeks   Time spent counseling the patient: 5 minutes       Depression    Remains depressed over his current situation we will increase dose of hydralazine for anxiety      Relevant Medications   hydrOXYzine (VISTARIL) 50 MG capsule   Back pain    Continue Lyrica  Hyperlipidemia    Continue lipid therapy      Other Visit Diagnoses     Need for immunization against influenza       Relevant Orders   Flu Vaccine QUAD 54moIM (Fluarix, Fluzone & Alfiuria Quad PF) (Completed)   St[place], hemorrhagic ([place]          Flu vaccine administered left deltoid while on the  home Meds ordered this encounter  Medications   hydrOXYzine (VISTARIL) 50 MG capsule    Sig: Take 1 capsule (50 mg total) by mouth every 8 (eight) hours as needed for anxiety.    Dispense:  90 capsule    Refill:  2    Mail to pt home   pantoprazole (PROTONIX) 40 MG tablet    Sig: TAKE 1 TABLET (40 MG TOTAL) BY MOUTH DAILY.    Dispense:  60 tablet    Refill:  2    Mail/deliver to patient home   senna (SENOKOT) 8.6 MG TABS tablet    Sig: Take 1 tablet (8.6 mg total) by mouth 2 (two) times daily.    Dispense:  120 tablet    Refill:  0     Follow-up: Return in about 6 weeks (around 11/02/2021).    PAsencion Noble MD

## 2021-09-21 NOTE — Assessment & Plan Note (Signed)
Rate is controlled not recurrent

## 2021-09-21 NOTE — Assessment & Plan Note (Signed)
Stable on proton pump inhibitor we will refill same

## 2021-09-21 NOTE — Assessment & Plan Note (Signed)
Continue lipid therapy 

## 2021-09-21 NOTE — Assessment & Plan Note (Addendum)
Wheelchair-bound homebound and even bedroom bound  Would benefit from a motorized wheelchair and a ramp built in the home  Would benefit from a hemi walker to assist in ambulation in the narrow walk areas of his mobile home

## 2021-09-21 NOTE — Assessment & Plan Note (Signed)
Remains depressed over his current situation we will increase dose of hydralazine for anxiety

## 2021-09-21 NOTE — Assessment & Plan Note (Signed)
Would benefit from repeat testing  With Medicaid will use a home sleep study

## 2021-09-21 NOTE — Assessment & Plan Note (Signed)
Obtain urinal for the patient

## 2021-09-21 NOTE — Assessment & Plan Note (Signed)
Blood pressure under excellent control at this time  No change in blood pressure medications refills given

## 2021-09-21 NOTE — Assessment & Plan Note (Signed)
Patient given some swallowing techniques

## 2021-09-21 NOTE — Assessment & Plan Note (Signed)
Continue Lyrica. 

## 2021-09-21 NOTE — Assessment & Plan Note (Signed)
  .   Current smoking consumption amount: 1/2 pack a day  . Dicsussion on advise to quit smoking and smoking impacts: Cardiovascular impacts  . Patient's willingness to quit: Not ready to quit  . Methods to quit smoking discussed: Nicotine replacement  . Medication management of smoking session drugs discussed: Nicotine replacement  . Resources provided:  AVS   . Setting quit date not established  . Follow-up arranged 6 weeks   Time spent counseling the patient: 5 minutes

## 2021-09-22 NOTE — Telephone Encounter (Signed)
[place] from [facility] called back in about the forms, she stated she will send those back over today, if someone can keep an eye out on those, please advise

## 2021-09-24 NOTE — Telephone Encounter (Signed)
I just did a house call on this pt lastweek  Direct forms to me

## 2021-09-25 ENCOUNTER — Telehealth: Payer: Self-pay

## 2021-09-25 DIAGNOSIS — I619 Nontraumatic intracerebral hemorrhage, unspecified: Secondary | ICD-10-CM | POA: Diagnosis not present

## 2021-09-25 NOTE — Telephone Encounter (Signed)
Referral for [facility] sent to [employer] fax # [phone]

## 2021-09-26 ENCOUNTER — Ambulatory Visit: Payer: Self-pay | Admitting: *Deleted

## 2021-09-26 NOTE — Telephone Encounter (Signed)
Provider has forms

## 2021-09-26 NOTE — Telephone Encounter (Signed)
Patient scheduled for 11/16

## 2021-09-26 NOTE — Telephone Encounter (Signed)
Patient's wife is calling- she is requesting virtual visit with Dr [name]- patient has had recent stroke and PCP is aware of his immobility. Patient's wife states patient has a tooth crumbling that is causing infection/facial swelling. She is requesting antibiotic treatment- she can do virtual visit. Advised no appointment available- will send message to provider for review.

## 2021-09-26 NOTE — Telephone Encounter (Signed)
Reason for Disposition  Toothache  Answer Assessment - Initial Assessment Questions 1. ONSET: "When did the swelling start?" (e.g., minutes, hours, days)     2 days ago 2. LOCATION: "What part of the face is swollen?"     Both sides- jaw area 3. SEVERITY: "How swollen is it?"     Visible swelling  4. ITCHING: "Is there any itching?" If Yes, ask: "How much?"   (Scale 1-10; mild, moderate or severe)     no 5. PAIN: "Is the swelling painful to touch?" If Yes, ask: "How painful is it?"   (Scale 1-10; mild, moderate or severe)   - NONE (0): no pain   - MILD (1-3): doesn't interfere with normal activities    - MODERATE (4-7): interferes with normal activities or awakens from sleep    - SEVERE (8-10): excruciating pain, unable to do any normal activities      no 6. FEVER: "Do you have a fever?" If Yes, ask: "What is it, how was it measured, and when did it start?"      no 7. CAUSE: "What do you think is causing the face swelling?"     Broken- infection in the mouth 8. RECURRENT SYMPTOM: "Have you had face swelling before?" If Yes, ask: "When was the last time?" "What happened that time?"     Yes- before the stroke 9. OTHER SYMPTOMS: "Do you have any other symptoms?" (e.g., toothache, leg swelling)     Teeth are breaking down- pieces are coming out 10. PREGNANCY: "Is there any chance you are pregnant?" "When was your last menstrual period?"       na  Protocols used: Face Swelling-A-AH

## 2021-09-27 ENCOUNTER — Telehealth (HOSPITAL_BASED_OUTPATIENT_CLINIC_OR_DEPARTMENT_OTHER): Payer: 59 | Admitting: Critical Care Medicine

## 2021-09-27 ENCOUNTER — Other Ambulatory Visit: Payer: Self-pay

## 2021-09-27 ENCOUNTER — Encounter: Payer: Self-pay | Admitting: Critical Care Medicine

## 2021-09-27 DIAGNOSIS — K047 Periapical abscess without sinus: Secondary | ICD-10-CM | POA: Diagnosis not present

## 2021-09-27 MED ORDER — AMOXICILLIN-POT CLAVULANATE 875-125 MG PO TABS
1.0000 | ORAL_TABLET | Freq: Two times a day (BID) | ORAL | 0 refills | Status: DC
  Filled 2021-09-27: qty 20, 10d supply, fill #0

## 2021-09-27 NOTE — Assessment & Plan Note (Signed)
Suspect dental abscess involving the upper jaw molar area with involvement and extension into the maxillary sinus  Plan is to begin Augmentin twice daily for 10 days  If symptoms do not improve we will need to bring the patient in for a facial CT scan  Patient does have insurance and we may yet need to have him see ENT or a oral maxillofacial surgeon

## 2021-09-27 NOTE — Progress Notes (Signed)
Established Patient Office Visit  Subjective:  Patient ID: Jason Romero, male    DOB: [birth year]  Age: 50 y.o. MRN: [id number] Virtual Visit via Video Note  I connected with Jason Romero on 09/27/21 at830am by a video enabled telemedicine application and verified that I am speaking with the correct person using two identifiers.   Consent:  I discussed the limitations, risks, security and privacy concerns of performing an evaluation and management service by video visit and the availability of in person appointments. I also discussed with the patient that there may be a patient responsible charge related to this service. The patient expressed understanding and agreed to proceed.  Location of patient: Patient's at home  Location of provider: I am in my office  Persons participating in the televisit with the patient.   Patient's spouse is on the call as well    CC: facial swelling  HPI Jason Romero presents for left axillary facial swelling.  Patient is also had increased pain in the left upper jaw area.  He has poor dentition.  Patient denies fever.  He denies eye vision loss.  He has full range of motion of his eye on the left side.  Patient's swallowing function has not changed.  He has no other complaints.    Past Medical History:  Diagnosis Date   A-fib [facility])    Chronic headaches    COPD (chronic obstructive pulmonary disease) (HCC)    GERD (gastroesophageal reflux disease)    History of blood transfusion    Hypertension    MI (myocardial infarction) ([place])    Murmur 02/12/2021   Seizures (HCC)     Past Surgical History:  Procedure Laterality Date   CHOLECYSTECTOMY      Family History  Problem Relation Age of Onset   Alcoholism Other    Arthritis Other    Breast cancer Other    Heart disease Mother     Social History   Socioeconomic History   Marital status: Married    Spouse name: Not on file   Number of children: Not on file   Years of education:  Not on file   Highest education level: Not on file  Occupational History   Not on file  Tobacco Use   Smoking status: Every Day    Packs/day: 1.00    Types: Cigarettes   Smokeless tobacco: Never  Vaping Use   Vaping Use: Never used  Substance and Sexual Activity   Alcohol use: Not Currently    Alcohol/week: 0.0 standard drinks   Drug use: Yes    Types: Marijuana    Comment: OCC   Sexual activity: Not Currently  Other Topics Concern   Not on file  Social History Narrative   Left handed   Social Determinants of Health   Financial Resource Strain: Not on file  Food Insecurity: Not on file  Transportation Needs: Not on file  Physical Activity: Not on file  Stress: Not on file  Social Connections: Not on file  Intimate Partner Violence: Not on file    Outpatient Medications Prior to Visit  Medication Sig Dispense Refill   acetaminophen (TYLENOL) 325 MG tablet Take 2 tablets (650 mg total) by mouth every 4 (four) hours as needed for mild pain (or temp > 37.5 C (99.5 F)).     amLODipine (NORVASC) 10 MG tablet TAKE 1 TABLET (10 MG TOTAL) BY MOUTH DAILY. 90 tablet 1   atorvastatin (LIPITOR) 20 MG tablet Take 1  tablet (20 mg total) by mouth daily. 90 tablet 1   baclofen (LIORESAL) 10 MG tablet Take 1 tablet (10 mg total) by mouth 3 (three) times daily. 90 each 5   Blood Pressure Monitoring (BLOOD PRESSURE KIT) DEVI Use to monitor blood pressure 1 each 0   hydrALAZINE (APRESOLINE) 50 MG tablet Take 1 tablet  by mouth 3 (three) times daily. 180 tablet 2   hydrOXYzine (VISTARIL) 50 MG capsule Take 1 capsule (50 mg total) by mouth every 8 (eight) hours as needed for anxiety. 90 capsule 2   labetalol (NORMODYNE) 300 MG tablet TAKE 1 TABLET (300 MG TOTAL) BY MOUTH EVERY 8 (EIGHT) HOURS. 180 tablet 2   lisinopril (ZESTRIL) 40 MG tablet Take 1 tablet (40 mg total) by mouth daily. 90 tablet 2   pantoprazole (PROTONIX) 40 MG tablet TAKE 1 TABLET (40 MG TOTAL) BY MOUTH DAILY. 60 tablet 2    pregabalin (LYRICA) 150 MG capsule Take 1 capsule (150 mg total) by mouth 2 (two) times daily. 60 capsule 4   senna (SENOKOT) 8.6 MG TABS tablet Take 1 tablet (8.6 mg total) by mouth 2 (two) times daily. 120 tablet 0   spironolactone (ALDACTONE) 25 MG tablet Take 1 tablet (25 mg total) by mouth daily. 90 tablet 1   traZODone (DESYREL) 100 MG tablet Take 1 tablet (100 mg total) by mouth at bedtime as needed for sleep. 60 tablet 2   No facility-administered medications prior to visit.    Allergies  Allergen Reactions   Hydrocodone Hives   Ibuprofen Other (See Comments)    Pt can't take this medication because it interacts with other medications that he is taking.     ROS Review of Systems  Constitutional: Negative.   HENT:  Positive for dental problem, facial swelling, sinus pressure and sinus pain. Negative for ear pain, postnasal drip, rhinorrhea, sore throat, trouble swallowing and voice change.   Eyes: Negative.   Respiratory: Negative.  Negative for apnea, cough, choking, chest tightness, shortness of breath, wheezing and stridor.   Cardiovascular: Negative.  Negative for chest pain, palpitations and leg swelling.  Gastrointestinal: Negative.  Negative for abdominal distention, abdominal pain, nausea and vomiting.  Genitourinary: Negative.   Musculoskeletal: Negative.  Negative for arthralgias and myalgias.  Skin: Negative.  Negative for rash.  Allergic/Immunologic: Negative.  Negative for environmental allergies and food allergies.  Neurological:  Positive for weakness. Negative for dizziness, syncope and headaches.  Hematological: Negative.  Negative for adenopathy. Does not bruise/bleed easily.  Psychiatric/Behavioral: Negative.  Negative for agitation and sleep disturbance. The patient is not nervous/anxious.      Objective:    Physical Exam This is a video visit I was able to look into the patient's mouth through the camera there is poor dentition left upper jaw not able to  actually see close enough to see if there is a dental abscess.  There is overt swelling of the left maxillary process and soft tissue.  Patient has full range of motion's of both eyes. There were no vitals taken for this visit. Wt Readings from Last 3 Encounters:  05/02/21 227 lb (103 kg)  10/19/20 227 lb 9.6 oz (103.2 kg)  09/20/20 240 lb 15.4 oz (109.3 kg)     Health Maintenance Due  Topic Date Due   Zoster Vaccines- Shingrix (1 of 2) Never done   COVID-19 Vaccine (4 - Booster for Pfizer series) 04/26/2021   Pneumococcal Vaccine 64-83 Years old (2 - PCV) 09/15/2021  There are no preventive care reminders to display for this patient.  Lab Results  Component Value Date   TSH 0.64 03/22/2016   Lab Results  Component Value Date   WBC 8.2 02/28/2021   HGB 13.1 (L) 02/28/2021   HCT 41.3 02/28/2021   MCV 87.1 02/28/2021   PLT 362 02/28/2021   Lab Results  Component Value Date   NA 140 04/28/2021   K 4.1 04/28/2021   CO2 22 04/28/2021   GLUCOSE 90 04/28/2021   BUN 13 04/28/2021   CREATININE 0.92 04/28/2021   BILITOT 0.5 04/28/2021   ALKPHOS 82 02/16/2021   AST 10 04/28/2021   ALT 9 04/28/2021   PROT 7.7 04/28/2021   ALBUMIN 4.4 02/16/2021   CALCIUM 10.0 04/28/2021   ANIONGAP 13 10/07/2020   GFR 70.92 02/27/2017   Lab Results  Component Value Date   CHOL 141 02/13/2021   Lab Results  Component Value Date   HDL 39 (L) 02/13/2021   Lab Results  Component Value Date   LDLCALC 87 02/13/2021   Lab Results  Component Value Date   TRIG 77 02/13/2021   Lab Results  Component Value Date   CHOLHDL 3.6 02/13/2021   Lab Results  Component Value Date   HGBA1C 5.6 09/12/2020      Assessment & Plan:   Problem List Items Addressed This Visit       Digestive   Dental abscess    Suspect dental abscess involving the upper jaw molar area with involvement and extension into the maxillary sinus  Plan is to begin Augmentin twice daily for 10 days  If symptoms  do not improve we will need to bring the patient in for a facial CT scan  Patient does have insurance and we may yet need to have him see ENT or a oral maxillofacial surgeon       Meds ordered this encounter  Medications   amoxicillin-clavulanate (AUGMENTIN) 875-125 MG tablet    Sig: Take 1 tablet by mouth 2 (two) times daily.    Dispense:  20 tablet    Refill:  0    Follow Up Instructions: Patient knows antibiotics will be sent in if he is unimproved we will need to do imaging studies and may need referral   I discussed the assessment and treatment plan with the patient. The patient was provided an opportunity to ask questions and all were answered. The patient agreed with the plan and demonstrated an understanding of the instructions.   The patient was advised to call back or seek an in-person evaluation if the symptoms worsen or if the condition fails to improve as anticipated.  I provided 12 minutes of non-face-to-face time during this encounter  including  median intraservice time , review of notes, labs, imaging, medications  and explaining diagnosis and management to the patient .     Asencion Noble, MD

## 2021-09-28 ENCOUNTER — Other Ambulatory Visit: Payer: Self-pay

## 2021-09-28 NOTE — Telephone Encounter (Signed)
Order for chux, wipes and urinal faxed to [employer] for review as requested by Anise Salvo [employer].

## 2021-09-29 ENCOUNTER — Telehealth: Payer: Self-pay | Admitting: *Deleted

## 2021-09-29 NOTE — Telephone Encounter (Signed)
Copied from CRM [id number]. Topic: General - Other >> Sep 28, 2021 11:33 AM Jason Romero wrote: Reason for CRM: Pt wife [name] requests call back from Dr. [name]. Pt wife stated it is personal. Cb# [phone]

## 2021-09-29 NOTE — Telephone Encounter (Signed)
Call completed

## 2021-10-02 DIAGNOSIS — I619 Nontraumatic intracerebral hemorrhage, unspecified: Secondary | ICD-10-CM | POA: Diagnosis not present

## 2021-10-03 DIAGNOSIS — I619 Nontraumatic intracerebral hemorrhage, unspecified: Secondary | ICD-10-CM | POA: Diagnosis not present

## 2021-10-04 DIAGNOSIS — I619 Nontraumatic intracerebral hemorrhage, unspecified: Secondary | ICD-10-CM | POA: Diagnosis not present

## 2021-10-09 DIAGNOSIS — I619 Nontraumatic intracerebral hemorrhage, unspecified: Secondary | ICD-10-CM | POA: Diagnosis not present

## 2021-10-10 DIAGNOSIS — I619 Nontraumatic intracerebral hemorrhage, unspecified: Secondary | ICD-10-CM | POA: Diagnosis not present

## 2021-10-11 DIAGNOSIS — I619 Nontraumatic intracerebral hemorrhage, unspecified: Secondary | ICD-10-CM | POA: Diagnosis not present

## 2021-10-12 ENCOUNTER — Ambulatory Visit: Payer: 59 | Admitting: Family

## 2021-10-12 DIAGNOSIS — I619 Nontraumatic intracerebral hemorrhage, unspecified: Secondary | ICD-10-CM | POA: Diagnosis not present

## 2021-10-13 ENCOUNTER — Telehealth (INDEPENDENT_AMBULATORY_CARE_PROVIDER_SITE_OTHER): Payer: 59 | Admitting: Family

## 2021-10-13 ENCOUNTER — Encounter: Payer: Self-pay | Admitting: Family

## 2021-10-13 ENCOUNTER — Telehealth: Payer: Self-pay | Admitting: *Deleted

## 2021-10-13 DIAGNOSIS — I6930 Unspecified sequelae of cerebral infarction: Secondary | ICD-10-CM

## 2021-10-13 DIAGNOSIS — I619 Nontraumatic intracerebral hemorrhage, unspecified: Secondary | ICD-10-CM | POA: Diagnosis not present

## 2021-10-13 DIAGNOSIS — B182 Chronic viral hepatitis C: Secondary | ICD-10-CM

## 2021-10-13 NOTE — Assessment & Plan Note (Signed)
Jason Romero has completed 12 weeks of Epclusa.  We will need to recheck hepatitis C RNA level to confirm sustained viremic response.  If undetectable cure has been achieved and no further treatment will be necessary.  He is at low risk for hepatocellular carcinoma with a score F0-F1 and no additional screening is necessary.  If future hepatitis C screening is necessary will need to recheck hepatitis C RNA level as hepatitis C antibody remains positive.  Follow-up pending blood work as needed.

## 2021-10-13 NOTE — Patient Outreach (Signed)
Care Coordination  10/13/2021  Jason Romero June 06, 1971 [id number]  Referral received requesting assistance with resources for wheelchair ramp. Referral made to community resource care guide team.   Plan: High-Risk Managed Medicaid social work team to follow up.   Marja Kays MHA,BSN,RN,CCM [place]  [facility] Embedded Care Coordination  High Risk Managed Medicaid Direct Dial:  [phone]  Fax: [phone]

## 2021-10-13 NOTE — Progress Notes (Signed)
Subjective:    Patient ID: Jason Romero, male    DOB: [birth year], 50 y.o.   MRN: [id number]  Chief Complaint  Patient presents with   Telehealth Consent     Virtual Visit via Telephone/Video Note   I connected with Jason Romero on 10/13/2021 at 10:00a m by video and verified that I am speaking with the correct person using two identifiers.   I discussed the limitations, risks, security and privacy concerns of performing an evaluation and management service by telephone and the availability of in person appointments. I also discussed with the patient that there may be a patient responsible charge related to this service. The patient expressed understanding and agreed to proceed.  Location:  Patient: Home Provider: Clinic   HPI:  Jason Romero is a 50 y.o. male with chronic hepatitis C last seen at the completion of treatment on 07/04/2021 by Magda Kiel, PharmD, CPP with Hepatitis C RNA level that was undetectable. Telehealth visit today for cure visit.   Jason Romero completed 12 weeks of Epclusa. Doing well since last visit with no new concerns/complaints. Currently without symptoms and denies abdominal pain, nausea, vomiting, fatigue, scleral icterus or jaundice. Has lack of transportation and was unable to make in person lab visit today.    Allergies  Allergen Reactions   Hydrocodone Hives   Ibuprofen Other (See Comments)    Pt can't take this medication because it interacts with other medications that he is taking.       Outpatient Medications Prior to Visit  Medication Sig Dispense Refill   acetaminophen (TYLENOL) 325 MG tablet Take 2 tablets (650 mg total) by mouth every 4 (four) hours as needed for mild pain (or temp > 37.5 C (99.5 F)).     amLODipine (NORVASC) 10 MG tablet TAKE 1 TABLET (10 MG TOTAL) BY MOUTH DAILY. 90 tablet 1   atorvastatin (LIPITOR) 20 MG tablet Take 1 tablet (20 mg total) by mouth daily. 90 tablet 1   baclofen (LIORESAL) 10 MG tablet  Take 1 tablet (10 mg total) by mouth 3 (three) times daily. 90 each 5   Blood Pressure Monitoring (BLOOD PRESSURE KIT) DEVI Use to monitor blood pressure 1 each 0   hydrALAZINE (APRESOLINE) 50 MG tablet Take 1 tablet  by mouth 3 (three) times daily. 180 tablet 2   hydrOXYzine (VISTARIL) 50 MG capsule Take 1 capsule (50 mg total) by mouth every 8 (eight) hours as needed for anxiety. 90 capsule 2   labetalol (NORMODYNE) 300 MG tablet TAKE 1 TABLET (300 MG TOTAL) BY MOUTH EVERY 8 (EIGHT) HOURS. 180 tablet 2   pantoprazole (PROTONIX) 40 MG tablet TAKE 1 TABLET (40 MG TOTAL) BY MOUTH DAILY. 60 tablet 2   pregabalin (LYRICA) 150 MG capsule Take 1 capsule (150 mg total) by mouth 2 (two) times daily. 60 capsule 4   senna (SENOKOT) 8.6 MG TABS tablet Take 1 tablet (8.6 mg total) by mouth 2 (two) times daily. 120 tablet 0   spironolactone (ALDACTONE) 25 MG tablet Take 1 tablet (25 mg total) by mouth daily. 90 tablet 1   traZODone (DESYREL) 100 MG tablet Take 1 tablet (100 mg total) by mouth at bedtime as needed for sleep. 60 tablet 2   amoxicillin-clavulanate (AUGMENTIN) 875-125 MG tablet Take 1 tablet by mouth 2 (two) times daily. (Patient not taking: Reported on 10/13/2021) 20 tablet 0   lisinopril (ZESTRIL) 40 MG tablet Take 1 tablet (40 mg total) by mouth daily. 90 tablet 2  No facility-administered medications prior to visit.     Past Medical History:  Diagnosis Date   A-fib [facility])    Chronic headaches    COPD (chronic obstructive pulmonary disease) (HCC)    GERD (gastroesophageal reflux disease)    History of blood transfusion    Hypertension    MI (myocardial infarction) ([place])    Murmur 02/12/2021   Seizures ([place])      Past Surgical History:  Procedure Laterality Date   CHOLECYSTECTOMY         Review of Systems  Constitutional:  Negative for chills, diaphoresis, fatigue and fever.  Respiratory:  Negative for cough, chest tightness, shortness of breath and wheezing.    Cardiovascular:  Negative for chest pain.  Gastrointestinal:  Negative for abdominal distention, abdominal pain, constipation, diarrhea, nausea and vomiting.  Neurological:  Negative for weakness and headaches.  Hematological:  Does not bruise/bleed easily.     Objective:    Nursing note and vital signs reviewed.    Mr Romero is doing well and in no apparent distress. Pleasant to speak with. Physical exam further limited by Telehealth visit.   Assessment & Plan:   Problem List Items Addressed This Visit       Digestive   Chronic hepatitis C without hepatic coma [facility])    Jason Romero has completed 12 weeks of Epclusa.  We will need to recheck hepatitis C RNA level to confirm sustained viremic response.  If undetectable cure has been achieved and no further treatment will be necessary.  He is at low risk for hepatocellular carcinoma with a score F0-F1 and no additional screening is necessary.  If future hepatitis C screening is necessary will need to recheck hepatitis C RNA level as hepatitis C antibody remains positive.  Follow-up pending blood work as needed.      Relevant Orders   Hepatitis C RNA quantitative     I am having Jason Romero maintain his acetaminophen, Blood Pressure Kit, amLODipine, atorvastatin, baclofen, hydrALAZINE, labetalol, lisinopril, pregabalin, spironolactone, traZODone, hydrOXYzine, pantoprazole, senna, and amoxicillin-clavulanate.   I discussed the assessment and treatment plan with the patient. The patient was provided an opportunity to ask questions and all were answered. The patient agreed with the plan and demonstrated an understanding of the instructions.   The patient was advised to call back or seek an in-person evaluation if the symptoms worsen or if the condition fails to improve as anticipated.   I provided 12  minutes of non-face-to-face time during this encounter.  Follow-up: Return if symptoms worsen or fail to improve.   Terri Piedra,  MSN, FNP-C Nurse Practitioner [facility] [place] number: [phone]

## 2021-10-16 ENCOUNTER — Other Ambulatory Visit: Payer: Self-pay

## 2021-10-16 DIAGNOSIS — I619 Nontraumatic intracerebral hemorrhage, unspecified: Secondary | ICD-10-CM | POA: Diagnosis not present

## 2021-10-17 ENCOUNTER — Other Ambulatory Visit: Payer: Self-pay

## 2021-10-17 DIAGNOSIS — I619 Nontraumatic intracerebral hemorrhage, unspecified: Secondary | ICD-10-CM | POA: Diagnosis not present

## 2021-10-18 DIAGNOSIS — I619 Nontraumatic intracerebral hemorrhage, unspecified: Secondary | ICD-10-CM | POA: Diagnosis not present

## 2021-10-19 DIAGNOSIS — I619 Nontraumatic intracerebral hemorrhage, unspecified: Secondary | ICD-10-CM | POA: Diagnosis not present

## 2021-10-20 DIAGNOSIS — I619 Nontraumatic intracerebral hemorrhage, unspecified: Secondary | ICD-10-CM | POA: Diagnosis not present

## 2021-10-23 DIAGNOSIS — I619 Nontraumatic intracerebral hemorrhage, unspecified: Secondary | ICD-10-CM | POA: Diagnosis not present

## 2021-10-24 DIAGNOSIS — I619 Nontraumatic intracerebral hemorrhage, unspecified: Secondary | ICD-10-CM | POA: Diagnosis not present

## 2021-10-25 ENCOUNTER — Other Ambulatory Visit: Payer: Self-pay

## 2021-10-25 DIAGNOSIS — I619 Nontraumatic intracerebral hemorrhage, unspecified: Secondary | ICD-10-CM | POA: Diagnosis not present

## 2021-10-27 ENCOUNTER — Other Ambulatory Visit: Payer: 59

## 2021-10-27 ENCOUNTER — Other Ambulatory Visit: Payer: Self-pay

## 2021-10-30 DIAGNOSIS — I619 Nontraumatic intracerebral hemorrhage, unspecified: Secondary | ICD-10-CM | POA: Diagnosis not present

## 2021-10-31 DIAGNOSIS — I619 Nontraumatic intracerebral hemorrhage, unspecified: Secondary | ICD-10-CM | POA: Diagnosis not present

## 2021-11-01 ENCOUNTER — Telehealth: Payer: Self-pay | Admitting: Critical Care Medicine

## 2021-11-01 DIAGNOSIS — I619 Nontraumatic intracerebral hemorrhage, unspecified: Secondary | ICD-10-CM | POA: Diagnosis not present

## 2021-11-01 DIAGNOSIS — G8110 Spastic hemiplegia affecting unspecified side: Secondary | ICD-10-CM

## 2021-11-01 NOTE — Telephone Encounter (Signed)
Copied from CRM [id number]. Topic: General - Other >> Nov 01, 2021  1:15 PM Gaetana Michaelis A wrote: Reason for CRM: Patient's caseworker Christiane Ha has called to request a prescription for a hemi walker   The patient is experiencing difficulty with movement on the right side of their body. The patient is currently using the back of their wheelchair and Christiane Ha would like for the patient to have a more stable option to use  Please contact further if needed

## 2021-11-02 ENCOUNTER — Telehealth: Payer: Self-pay

## 2021-11-02 DIAGNOSIS — I619 Nontraumatic intracerebral hemorrhage, unspecified: Secondary | ICD-10-CM | POA: Diagnosis not present

## 2021-11-02 NOTE — Telephone Encounter (Signed)
Order for hemi walker faxed to [employer]

## 2021-11-02 NOTE — Telephone Encounter (Signed)
FYI

## 2021-11-02 NOTE — Telephone Encounter (Signed)
Called Pt caseworker, Mr. Christiane Ha to let him know of rx  hemiwalker

## 2021-11-02 NOTE — Telephone Encounter (Signed)
Rx for hemiwalker made

## 2021-11-07 DIAGNOSIS — I619 Nontraumatic intracerebral hemorrhage, unspecified: Secondary | ICD-10-CM | POA: Diagnosis not present

## 2021-11-08 ENCOUNTER — Telehealth: Payer: Self-pay

## 2021-11-08 DIAGNOSIS — I619 Nontraumatic intracerebral hemorrhage, unspecified: Secondary | ICD-10-CM | POA: Diagnosis not present

## 2021-11-08 NOTE — Telephone Encounter (Signed)
° °  Telephone encounter was:  Successful.  11/08/2021 Name: Jason Romero MRN: [id number] DOB: June 19, 1971  Jason Romero is a 50 y.o. year old male who is a primary care patient of Storm Frisk, MD . The community resource team was consulted for assistance with  wheelchair ramp  Care guide performed the following interventions: Patient provided with information about care guide support team and interviewed to confirm resource needs Obtained verbal consent to place patient referral to [facility] for wheelchair ramp .  Follow Up Plan:  Care guide will follow up with patient by phone over the next 7-10  days.  Whitnie Deleon, AAS Paralegal, [facility] Care Guide  Embedded Care Coordination [place]   Care Management  [address] [place], [place] [address] ??[email or web address]   ?? [phone]   [email or web address]

## 2021-11-09 ENCOUNTER — Telehealth: Payer: Self-pay

## 2021-11-09 NOTE — Progress Notes (Deleted)
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 Paroxysmal atrial fibrillation - rate controlled. Hypertension   *** .     Subjective:  Jason Romero is a 50 year old left-handed male with a fib, COPD, HTN, CAD s/p MI, chronic hepatitis and history of seizures who follows up for pain and stroke.  He is accompanied by his wife.  UPDATE: Current medications:  gabapentin 670m three times daily, baclofen 159mthree times daily, fish oil, labetalol, lisinopril, hydralazine  ***   HISTORY: [facility] for hypertensive left basal ganglia hemorrhage presenting as right sided facial droop, right sided facial weakness and slurred speech.  CTA head and neck unremarkable.  Echocartdiogram showed EF 50-55% with no cardiac source of emboli.  Previously on ASA which was stopped.  Later, started on ACOcige Inciven a fib.  Since the stroke, he reports right sided upper and lower extremity pain.  Burning pain is well controlled with gabapentin.  However, he also has intermittent severe aching pain as well, which the gabapentin doesn't touch.  He does have back pain as well, which he attributes to being mostly nonambulatory since his stroke.  He continues to have effortful speech as well.  PAST MEDICAL HISTORY: Past Medical History:  [facility]   [facility]   Ch[facility]    COPD (chronic obstructive pulmonary disease) (HCC)    GERD (gastroesophageal reflux disease)    History of blood transfusion    Hypertension    [place]   [place] 02/12/2021   Seizures (HCC)     MEDICATIONS: Current Outpatient Medications on File Prior to Visit  Medication Sig Dispense  Refill   acetaminophen (TYLENOL) 325 MG tablet Take 2 tablets (650 mg total) by mouth every 4 (four) hours as needed for mild pain (or temp > 37.5 C (99.5 F)).     amLODipine (NORVASC) 10 MG tablet TAKE 1 TABLET (10 MG TOTAL) BY MOUTH DAILY. 90 tablet 1   amoxicillin-clavulanate (AUGMENTIN) 875-125 MG tablet Take 1 tablet by mouth 2 (two) times daily. (Patient not taking: Reported on 10/13/2021) 20 tablet 0   atorvastatin (LIPITOR) 20 MG tablet Take 1 tablet (20 mg total) by mouth daily. 90 tablet 1   baclofen (LIORESAL) 10 MG tablet Take 1 tablet (10 mg total) by mouth 3 (three) times daily. 90 each 5   Blood Pressure Monitoring (BLOOD PRESSURE KIT) DEVI Use to monitor blood pressure 1 each 0   hydrALAZINE (APRESOLINE) 50 MG tablet Take 1 tablet  by mouth 3 (three) times daily. 180 tablet 2   hydrOXYzine (VISTARIL) 50 MG capsule Take 1 capsule (50 mg total) by mouth every 8 (eight) hours as needed for anxiety. 90 capsule 2   labetalol (NORMODYNE) 300 MG tablet TAKE 1 TABLET (300 MG TOTAL) BY MOUTH EVERY 8 (EIGHT) HOURS. 180 tablet 2   lisinopril (ZESTRIL) 40 MG tablet Take 1 tablet (40 mg total) by mouth daily. 90 tablet 2   pantoprazole (PROTONIX) 40 MG tablet TAKE 1 TABLET (40 MG TOTAL) BY MOUTH DAILY. 60 tablet 2   pregabalin (LYRICA) 150 MG capsule Take 1 capsule (150 mg total) by mouth 2 (two) times daily. 60 capsule 4  senna (SENOKOT) 8.6 MG TABS tablet Take 1 tablet (8.6 mg total) by mouth 2 (two) times daily. 120 tablet 0   spironolactone (ALDACTONE) 25 MG tablet Take 1 tablet (25 mg total) by mouth daily. 90 tablet 1   traZODone (DESYREL) 100 MG tablet Take 1 tablet (100 mg total) by mouth at bedtime as needed for sleep. 60 tablet 2   No current facility-administered medications on file prior to visit.    ALLERGIES: Allergies  Allergen Reactions   Hydrocodone Hives   Ibuprofen Other (See Comments)    Pt can't take this medication because it interacts with other medications that he  is taking.     FAMILY HISTORY: Family History  Problem Relation Age of Onset   Alcoholism Other    Arthritis Other    Breast cancer Other    Heart disease Mother       Objective:  *** General: No acute distress.  Patient appears well-groomed.   Head:  Normocephalic/atraumatic Eyes:  Fundi examined but not visualized Neck: supple, no paraspinal tenderness, full range of motion Heart:  Regular rate and rhythm Lungs:  Clear to auscultation bilaterally Back: No paraspinal tenderness Neurological Exam: alert and oriented to person, place, and time.  Speech fluent and not dysarthric, language intact.  Reduced right V1-V3 sensation.  Right lower facial weakness.  Otherwise, CN II-XII intact.  Increased tone/spasticity in proximal right upper extremity and right lower extremity; no fasciculations., Motor:  4+ right hand grip, 3- right hip flexion, 5 right knee extension, otherwise right upper and lower extremity muscles plegic.  5/5 left upper and lower extremities.  Pinprick and vibratory sensation reduced in right upper and lower extremities.  Deep Tendon Reflexes 3+ right upper and lower extremities, 2+ left upper and lower extremities,  toes equivocal   Finger to nose testing:  Without dysmetria on left, unable to assess right; gait ***   Metta Clines, DO  CC: Asencion Noble, MD

## 2021-11-09 NOTE — Telephone Encounter (Signed)
° °  Telephone encounter was:  Successful.  11/09/2021 Name: [name] MRN: [id number] DOB: [birth year]  Jason Romero is a 50 y.o. year old male who is a primary care patient of [name] [name], MD . The community resource team was consulted for assistance with Home Modifications  Care guide performed the following interventions: Received message via [id number] from Narda Bonds at [facility] patient and his wife were contacted and intake with Sue Lush is scheduled for Jan. 14th at 11am to discuss ramp for home.    Follow Up Plan:  No further follow up planned at this time. The patient has been provided with needed resources.  Algenis Ballin, AAS Paralegal, [facility] Care Guide  Embedded Care Coordination [place]   Care Management  [address] [place], [place] [address] ??[email or web address]   ?? [phone]   [email or web address]

## 2021-11-10 ENCOUNTER — Ambulatory Visit: Payer: 59 | Admitting: Neurology

## 2021-11-14 ENCOUNTER — Telehealth: Payer: Self-pay | Admitting: Critical Care Medicine

## 2021-11-14 NOTE — Telephone Encounter (Signed)
Pts wife Ladavion Savitz is caling to request if Dr. [name] can order the patient a side walker to [facility]. [employer] has is on hold.  Please advise CB- [phone]

## 2021-11-14 NOTE — Telephone Encounter (Signed)
I already have sent this prescription last week

## 2021-11-20 ENCOUNTER — Other Ambulatory Visit: Payer: Self-pay

## 2021-11-21 ENCOUNTER — Other Ambulatory Visit: Payer: Self-pay

## 2021-11-23 DIAGNOSIS — R32 Unspecified urinary incontinence: Secondary | ICD-10-CM | POA: Diagnosis not present

## 2021-11-23 DIAGNOSIS — R8290 Unspecified abnormal findings in urine: Secondary | ICD-10-CM | POA: Diagnosis not present

## 2021-11-27 ENCOUNTER — Other Ambulatory Visit: Payer: Self-pay

## 2021-11-30 ENCOUNTER — Other Ambulatory Visit: Payer: Self-pay

## 2021-12-04 ENCOUNTER — Other Ambulatory Visit: Payer: Self-pay

## 2021-12-06 ENCOUNTER — Telehealth: Payer: Self-pay | Admitting: Critical Care Medicine

## 2021-12-06 NOTE — Telephone Encounter (Signed)
Copied from CRM [id number]. Topic: General - Other >> Dec 05, 2021  9:31 AM Gaetana Michaelis A wrote: Reason for CRM: The patient's wife has called for an update on a previous request for a prescription for a side walker that was to be sent to [facility]  Please contact further when possible

## 2021-12-12 ENCOUNTER — Other Ambulatory Visit: Payer: Self-pay

## 2021-12-25 ENCOUNTER — Other Ambulatory Visit: Payer: Self-pay

## 2021-12-25 DIAGNOSIS — R8290 Unspecified abnormal findings in urine: Secondary | ICD-10-CM | POA: Diagnosis not present

## 2021-12-25 DIAGNOSIS — R32 Unspecified urinary incontinence: Secondary | ICD-10-CM | POA: Diagnosis not present

## 2021-12-26 ENCOUNTER — Telehealth: Payer: Self-pay

## 2021-12-26 ENCOUNTER — Other Ambulatory Visit: Payer: Self-pay

## 2021-12-26 NOTE — Telephone Encounter (Signed)
Last note from October reflects the hemi walker need  Can you call the pt wife and let her know I can no longer do house calls per [facility] leadership  She will need to make an appt at the clinic so I can reassess this patient

## 2021-12-26 NOTE — Telephone Encounter (Signed)
I spoke to Adrienne/ [place] regarding the order for the hemi walker.  She explained that they are waiting for  documentation of a face to face encounter that addressed the need for the hemi walker.   This can be done in an addendum to a prior encounter. They have voided the order but will re-instate it when the documentation is received

## 2021-12-27 NOTE — Telephone Encounter (Signed)
Documentation addressing the need for the hemi walker faxed to [facility].  I called patient's wife and explained that Dr [name] it is no longer able to make house calls. She said that would be fine.  A friend built a ramp for them and she is now able to get her husband out of the house.  Provided her with the phone number for [facility] to schedule transportation to medical appointments.  She understands they need 3 day notice to schedule rides but she will call [employer] to confirm. Instructed her to call me back if she has questions.   He has been scheduled to see Dr [name] 01/08/2022 at the [facility].

## 2021-12-27 NOTE — Telephone Encounter (Signed)
Great news ,thank you Erskine Squibb!

## 2022-01-01 ENCOUNTER — Other Ambulatory Visit: Payer: Self-pay | Admitting: Critical Care Medicine

## 2022-01-01 ENCOUNTER — Other Ambulatory Visit (HOSPITAL_COMMUNITY): Payer: Self-pay

## 2022-01-01 ENCOUNTER — Other Ambulatory Visit: Payer: Self-pay

## 2022-01-01 MED ORDER — PREGABALIN 150 MG PO CAPS
150.0000 mg | ORAL_CAPSULE | Freq: Two times a day (BID) | ORAL | 4 refills | Status: DC
  Filled 2022-01-01: qty 60, 30d supply, fill #0

## 2022-01-02 ENCOUNTER — Other Ambulatory Visit (HOSPITAL_COMMUNITY): Payer: Self-pay

## 2022-01-02 MED ORDER — HYDROXYZINE PAMOATE 50 MG PO CAPS
50.0000 mg | ORAL_CAPSULE | Freq: Three times a day (TID) | ORAL | 2 refills | Status: DC | PRN
  Filled 2022-01-02: qty 90, 30d supply, fill #0

## 2022-01-02 NOTE — Telephone Encounter (Signed)
Requested Prescriptions  Pending Prescriptions Disp Refills   hydrOXYzine (VISTARIL) 50 MG capsule 90 capsule 2    Sig: Take 1 capsule (50 mg total) by mouth every 8 (eight) hours as needed for anxiety.     Ear, Nose, and Throat:  Antihistamines 2 Passed - 01/01/2022  9:49 AM      Passed - Cr in normal range and within 360 days    Creat  Date Value Ref Range Status  04/28/2021 0.92 0.70 - 1.33 mg/dL Final    Comment:    For patients >53 years of age, the reference limit for Creatinine is approximately 13% higher for people identified as African-American. Verna Czech - Valid encounter within last 12 months    Recent Outpatient Visits          3 months ago Dental abscess   [facility] Storm Frisk, MD   3 months ago Essential hypertension   [facility] Storm Frisk, MD   4 months ago Essential hypertension   [facility] Storm Frisk, MD   5 months ago Essential hypertension   [facility] Storm Frisk, MD   7 months ago Stroke, hemorrhagic [facility])   [facility] Storm Frisk, MD      Future Appointments            In 6 days Storm Frisk, MD [facility]

## 2022-01-04 ENCOUNTER — Other Ambulatory Visit (HOSPITAL_COMMUNITY): Payer: Self-pay

## 2022-01-06 ENCOUNTER — Other Ambulatory Visit (HOSPITAL_COMMUNITY): Payer: Self-pay

## 2022-01-06 NOTE — Progress Notes (Signed)
Established Patient Office Visit  Subjective:  Patient ID: Jason Romero, male    DOB: 08-11-1971  Age: 51 y.o. MRN: [id number]  CC: Primary care follow-up  HPI Jason Romero presents for primary care follow-up in person.  History of longstanding hypertension previous hemorrhagic stroke with abnormality of gait as late effect of his cerebrovascular accident with right upper and lower extremity spasticity and wheelchair-bound.  Overall patient is doing well except he would like an increased dose of the baclofen.  Blood pressure on arrival 129/87.  He has significant dental disease and needs referral for same.  He needs to understand how to navigate the Medicaid transportation system and his personal care assistant no longer comes to see him he needs a new personal care assistant.  Patient still smoking about 1/2 pack a day  He patient does agree to a Prevnar 20 pneumonia vaccine at this visit was given    Past Medical History:  Diagnosis Date   A-fib [facility])    Chronic headaches    COPD (chronic obstructive pulmonary disease) (HCC)    GERD (gastroesophageal reflux disease)    History of blood transfusion    Hypertension    MI (myocardial infarction) ([place])    Murmur 02/12/2021   Seizures (HCC)     Past Surgical History:  Procedure Laterality Date   CHOLECYSTECTOMY      Family History  Problem Relation Age of Onset   Alcoholism Other    Arthritis Other    Breast cancer Other    Heart disease Mother     Social History   Socioeconomic History   Marital status: Married    Spouse name: Not on file   Number of children: Not on file   Years of education: Not on file   Highest education level: Not on file  Occupational History   Not on file  Tobacco Use   Smoking status: Every Day    Packs/day: 1.00    Types: Cigarettes   Smokeless tobacco: Never  Vaping Use   Vaping Use: Never used  Substance and Sexual Activity   Alcohol use: Not Currently     Alcohol/week: 0.0 standard drinks   Drug use: Yes    Types: Marijuana    Comment: OCC   Sexual activity: Not Currently  Other Topics Concern   Not on file  Social History Narrative   Left handed   Social Determinants of Health   Financial Resource Strain: Medium Risk   Difficulty of Paying Living Expenses: Somewhat hard  Food Insecurity: Not on file  Transportation Needs: Not on file  Physical Activity: Not on file  Stress: Not on file  Social Connections: Not on file  Intimate Partner Violence: Not on file    Outpatient Medications Prior to Visit  Medication Sig Dispense Refill   acetaminophen (TYLENOL) 325 MG tablet Take 2 tablets (650 mg total) by mouth every 4 (four) hours as needed for mild pain (or temp > 37.5 C (99.5 F)).     amoxicillin-clavulanate (AUGMENTIN) 875-125 MG tablet Take 1 tablet by mouth 2 (two) times daily. 20 tablet 0   Blood Pressure Monitoring (BLOOD PRESSURE KIT) DEVI Use to monitor blood pressure 1 each 0   senna (SENOKOT) 8.6 MG TABS tablet Take 1 tablet (8.6 mg total) by mouth 2 (two) times daily. 120 tablet 0   amLODipine (NORVASC) 10 MG tablet TAKE 1 TABLET (10 MG TOTAL) BY MOUTH DAILY. 90 tablet 1   atorvastatin (LIPITOR) 20 MG  tablet Take 1 tablet (20 mg total) by mouth daily. 90 tablet 1   baclofen (LIORESAL) 10 MG tablet Take 1 tablet (10 mg total) by mouth 3 (three) times daily. 90 each 5   hydrALAZINE (APRESOLINE) 50 MG tablet Take 1 tablet  by mouth 3 (three) times daily. 180 tablet 2   hydrOXYzine (VISTARIL) 50 MG capsule Take 1 capsule (50 mg total) by mouth every 8 (eight) hours as needed for anxiety. 90 capsule 2   labetalol (NORMODYNE) 300 MG tablet TAKE 1 TABLET (300 MG TOTAL) BY MOUTH EVERY 8 (EIGHT) HOURS. 180 tablet 2   lisinopril (ZESTRIL) 40 MG tablet Take 1 tablet (40 mg total) by mouth daily. 90 tablet 2   pantoprazole (PROTONIX) 40 MG tablet TAKE 1 TABLET (40 MG TOTAL) BY MOUTH DAILY. 60 tablet 2   pregabalin  (LYRICA) 150 MG capsule Take 1 capsule (150 mg total) by mouth 2 (two) times daily. 60 capsule 4   spironolactone (ALDACTONE) 25 MG tablet Take 1 tablet (25 mg total) by mouth daily. 90 tablet 1   traZODone (DESYREL) 100 MG tablet Take 1 tablet (100 mg total) by mouth at bedtime as needed for sleep. 60 tablet 2   No facility-administered medications prior to visit.    Allergies  Allergen Reactions   Hydrocodone Hives   Ibuprofen Other (See Comments)    Pt can't take this medication because it interacts with other medications that he is taking.     ROS Review of Systems  Constitutional:  Negative for chills, diaphoresis and fever.  HENT:  Negative for congestion, hearing loss, nosebleeds, sore throat and tinnitus.   Eyes:  Negative for photophobia and redness.  Respiratory:  Negative for cough, shortness of breath, wheezing and stridor.   Cardiovascular:  Negative for chest pain, palpitations and leg swelling.  Gastrointestinal:  Negative for abdominal pain, blood in stool, constipation, diarrhea, nausea and vomiting.  Endocrine: Negative for polydipsia.  Genitourinary:  Negative for dysuria, flank pain, frequency, hematuria and urgency.  Musculoskeletal:  Positive for gait problem. Negative for back pain, myalgias and neck pain.  Skin:  Negative for rash.  Allergic/Immunologic: Negative for environmental allergies.  Neurological:  Positive for weakness. Negative for dizziness, tremors, seizures and headaches.  Hematological:  Does not bruise/bleed easily.  Psychiatric/Behavioral:  Negative for suicidal ideas. The patient is not nervous/anxious.      Objective:    Physical Exam Vitals reviewed.  Constitutional:      Appearance: Normal appearance. He is well-developed. He is not diaphoretic.  HENT:     Head: Normocephalic and atraumatic.     Nose: No nasal deformity, septal deviation, mucosal edema or rhinorrhea.     Right Sinus: No maxillary sinus tenderness or frontal  sinus tenderness.     Left Sinus: No maxillary sinus tenderness or frontal sinus tenderness.     Mouth/Throat:     Pharynx: No oropharyngeal exudate.     Comments: Poor dentition multiple dental caries Eyes:     General: No scleral icterus.    Conjunctiva/sclera: Conjunctivae normal.     Pupils: Pupils are equal, round, and reactive to light.  Neck:     Thyroid: No thyromegaly.     Vascular: No carotid bruit or JVD.     Trachea: Trachea normal. No tracheal tenderness or tracheal deviation.  Cardiovascular:     Rate and Rhythm: Normal rate and regular rhythm.     Chest Wall: PMI is not displaced.     Pulses: Normal  pulses. No decreased pulses.     Heart sounds: Normal heart sounds, S1 normal and S2 normal. Heart sounds not distant. No murmur heard. No systolic murmur is present.  No diastolic murmur is present.    No friction rub. No gallop. No S3 or S4 sounds.  Pulmonary:     Effort: No tachypnea, accessory muscle usage or respiratory distress.     Breath sounds: No stridor. No decreased breath sounds, wheezing, rhonchi or rales.  Chest:     Chest wall: No tenderness.  Abdominal:     General: Bowel sounds are normal. There is no distension.     Palpations: Abdomen is soft. Abdomen is not rigid.     Tenderness: There is no abdominal tenderness. There is no guarding or rebound.  Musculoskeletal:        General: Normal range of motion.     Cervical back: Normal range of motion and neck supple. No edema, erythema or rigidity. No muscular tenderness. Normal range of motion.  Lymphadenopathy:     Head:     Right side of head: No submental or submandibular adenopathy.     Left side of head: No submental or submandibular adenopathy.     Cervical: No cervical adenopathy.  Skin:    General: Skin is warm and dry.     Coloration: Skin is not pale.     Findings: No rash.     Nails: There is no clubbing.  Neurological:     Mental Status: He is alert and oriented to person, place, and  time. Mental status is at baseline.     Sensory: No sensory deficit.     Motor: Weakness present.     Gait: Gait abnormal.  Psychiatric:        Mood and Affect: Mood normal.        Speech: Speech normal.        Behavior: Behavior normal.        Thought Content: Thought content normal.        Judgment: Judgment normal.    BP 129/87    Pulse 81    SpO2 98%  Wt Readings from Last 3 Encounters:  05/02/21 227 lb (103 kg)  10/19/20 227 lb 9.6 oz (103.2 kg)  09/20/20 240 lb 15.4 oz (109.3 kg)     Health Maintenance Due  Topic Date Due   Zoster Vaccines- Shingrix (1 of 2) Never done   COVID-19 Vaccine (4 - Booster for Pfizer series) 04/26/2021    There are no preventive care reminders to display for this patient.  Lab Results  Component Value Date   TSH 0.64 03/22/2016   Lab Results  Component Value Date   WBC 8.2 02/28/2021   HGB 13.1 (L) 02/28/2021   HCT 41.3 02/28/2021   MCV 87.1 02/28/2021   PLT 362 02/28/2021   Lab Results  Component Value Date   NA 140 04/28/2021   K 4.1 04/28/2021   CO2 22 04/28/2021   GLUCOSE 90 04/28/2021   BUN 13 04/28/2021   CREATININE 0.92 04/28/2021   BILITOT 0.5 04/28/2021   ALKPHOS 82 02/16/2021   AST 10 04/28/2021   ALT 9 04/28/2021   PROT 7.7 04/28/2021   ALBUMIN 4.4 02/16/2021   CALCIUM 10.0 04/28/2021   ANIONGAP 13 10/07/2020   GFR 70.92 02/27/2017   Lab Results  Component Value Date   CHOL 141 02/13/2021   Lab Results  Component Value Date   HDL 39 (L) 02/13/2021   Lab  Results  Component Value Date   LDLCALC 87 02/13/2021   Lab Results  Component Value Date   TRIG 77 02/13/2021   Lab Results  Component Value Date   CHOLHDL 3.6 02/13/2021   Lab Results  Component Value Date   HGBA1C 5.6 09/12/2020      Assessment & Plan:   Problem List Items Addressed This Visit       Cardiovascular and Mediastinum   Essential hypertension - Primary    Blood pressure currently stable no change in medications  refills given      Relevant Medications   amLODipine (NORVASC) 10 MG tablet   atorvastatin (LIPITOR) 20 MG tablet   hydrALAZINE (APRESOLINE) 50 MG tablet   labetalol (NORMODYNE) 300 MG tablet   lisinopril (ZESTRIL) 40 MG tablet   spironolactone (ALDACTONE) 25 MG tablet   Other Relevant Orders   Comprehensive metabolic panel   CBC with Differential/Platelet     Respiratory   COPD (chronic obstructive pulmonary disease) (HCC)    Continue inhaled medications        Digestive   Chronic hepatitis C without hepatic coma (HCC)    Currently stable      Dental abscess    Dental referral given        Nervous and Auditory   Spastic hemiplegia affecting nondominant side (HCC)    Increase dose of baclofen to 20 mg 3 times daily        Other   Tobacco abuse       Current smoking consumption amount: 1/2 pack a day  Dicsussion on advise to quit smoking and smoking impacts: Cardiovascular impacts  Patient's willingness to quit: Not ready to quit  Methods to quit smoking discussed: Nicotine replacement  Medication management of smoking session drugs discussed: Nicotine replacement  Resources provided:  AVS   Setting quit date not established  Follow-up arranged 4 mo   Time spent counseling the patient: 5 minutes       Hyperlipidemia   Relevant Medications   amLODipine (NORVASC) 10 MG tablet   atorvastatin (LIPITOR) 20 MG tablet   hydrALAZINE (APRESOLINE) 50 MG tablet   labetalol (NORMODYNE) 300 MG tablet   lisinopril (ZESTRIL) 40 MG tablet   spironolactone (ALDACTONE) 25 MG tablet   Other Relevant Orders   Lipid panel   Other Visit Diagnoses     Stroke, hemorrhagic (HCC)       Relevant Medications   amLODipine (NORVASC) 10 MG tablet   hydrALAZINE (APRESOLINE) 50 MG tablet   labetalol (NORMODYNE) 300 MG tablet   lisinopril (ZESTRIL) 40 MG tablet   Other Relevant Orders   Lipid panel   Need for Streptococcus pneumoniae vaccination       Relevant Orders    Pneumococcal conjugate vaccine 20-valent (Completed)       Meds ordered this encounter  Medications   amLODipine (NORVASC) 10 MG tablet    Sig: TAKE 1 TABLET (10 MG TOTAL) BY MOUTH DAILY.    Dispense:  90 tablet    Refill:  1    Mail/deliver to patient home, future refill   atorvastatin (LIPITOR) 20 MG tablet    Sig: Take 1 tablet (20 mg total) by mouth daily.    Dispense:  90 tablet    Refill:  1    Mail/deliver to patient home, future refill   baclofen (LIORESAL) 20 MG tablet    Sig: Take 1 tablet (20 mg total) by mouth 3 (three) times daily.    Dispense:  90 tablet    Refill:  2    Mail/deliver to patient home, future refill, dose change   hydrALAZINE (APRESOLINE) 50 MG tablet    Sig: Take 1 tablet  by mouth 3 (three) times daily.    Dispense:  180 tablet    Refill:  2    Mail/deliver to patient home, future refill   hydrOXYzine (VISTARIL) 50 MG capsule    Sig: Take 1 capsule (50 mg total) by mouth every 8 (eight) hours as needed for anxiety.    Dispense:  90 capsule    Refill:  2    Mail/deliver to patient home, future refill   labetalol (NORMODYNE) 300 MG tablet    Sig: TAKE 1 TABLET (300 MG TOTAL) BY MOUTH EVERY 8 (EIGHT) HOURS.    Dispense:  180 tablet    Refill:  2    Mail/deliver to patient home, future refill   lisinopril (ZESTRIL) 40 MG tablet    Sig: Take 1 tablet (40 mg total) by mouth daily.    Dispense:  90 tablet    Refill:  2    Mail/deliver to patient home, future refill   pantoprazole (PROTONIX) 40 MG tablet    Sig: TAKE 1 TABLET (40 MG TOTAL) BY MOUTH DAILY.    Dispense:  60 tablet    Refill:  2    Mail/deliver to patient home, future refill   pregabalin (LYRICA) 150 MG capsule    Sig: Take 1 capsule (150 mg total) by mouth 2 (two) times daily.    Dispense:  60 capsule    Refill:  4    Mail/deliver to patient home, future refill   spironolactone (ALDACTONE) 25 MG tablet    Sig: Take 1 tablet (25 mg total) by mouth daily.     Dispense:  90 tablet    Refill:  1    Mail/deliver to patient home, future refill   traZODone (DESYREL) 100 MG tablet    Sig: Take 1 tablet (100 mg total) by mouth at bedtime as needed for sleep.    Dispense:  60 tablet    Refill:  2    Mail/deliver to patient home, future refill    Follow-up: Return in about 4 months (around 05/08/2022).    Asencion Noble, MD

## 2022-01-08 ENCOUNTER — Other Ambulatory Visit: Payer: Self-pay

## 2022-01-08 ENCOUNTER — Ambulatory Visit: Payer: Medicaid Other | Attending: Critical Care Medicine | Admitting: Critical Care Medicine

## 2022-01-08 ENCOUNTER — Encounter: Payer: Self-pay | Admitting: Critical Care Medicine

## 2022-01-08 ENCOUNTER — Telehealth: Payer: Self-pay

## 2022-01-08 VITALS — BP 129/87 | HR 81

## 2022-01-08 DIAGNOSIS — G8110 Spastic hemiplegia affecting unspecified side: Secondary | ICD-10-CM

## 2022-01-08 DIAGNOSIS — B182 Chronic viral hepatitis C: Secondary | ICD-10-CM

## 2022-01-08 DIAGNOSIS — Z23 Encounter for immunization: Secondary | ICD-10-CM

## 2022-01-08 DIAGNOSIS — E782 Mixed hyperlipidemia: Secondary | ICD-10-CM

## 2022-01-08 DIAGNOSIS — I619 Nontraumatic intracerebral hemorrhage, unspecified: Secondary | ICD-10-CM | POA: Diagnosis not present

## 2022-01-08 DIAGNOSIS — Z720 Tobacco use: Secondary | ICD-10-CM

## 2022-01-08 DIAGNOSIS — J432 Centrilobular emphysema: Secondary | ICD-10-CM

## 2022-01-08 DIAGNOSIS — I10 Essential (primary) hypertension: Secondary | ICD-10-CM

## 2022-01-08 DIAGNOSIS — F17210 Nicotine dependence, cigarettes, uncomplicated: Secondary | ICD-10-CM

## 2022-01-08 DIAGNOSIS — K047 Periapical abscess without sinus: Secondary | ICD-10-CM

## 2022-01-08 MED ORDER — SPIRONOLACTONE 25 MG PO TABS
25.0000 mg | ORAL_TABLET | Freq: Every day | ORAL | 1 refills | Status: DC
  Filled 2022-01-08 – 2022-03-29 (×2): qty 90, 90d supply, fill #0

## 2022-01-08 MED ORDER — ATORVASTATIN CALCIUM 20 MG PO TABS
20.0000 mg | ORAL_TABLET | Freq: Every day | ORAL | 1 refills | Status: DC
  Filled 2022-01-08 – 2022-01-09 (×2): qty 90, 90d supply, fill #0
  Filled 2022-05-03: qty 90, 90d supply, fill #1

## 2022-01-08 MED ORDER — TRAZODONE HCL 100 MG PO TABS
100.0000 mg | ORAL_TABLET | Freq: Every evening | ORAL | 2 refills | Status: DC | PRN
  Filled 2022-01-08 – 2022-02-13 (×2): qty 60, 60d supply, fill #0
  Filled 2022-02-27: qty 30, 30d supply, fill #0
  Filled 2022-03-29: qty 30, 30d supply, fill #1
  Filled 2022-05-03: qty 30, 30d supply, fill #2

## 2022-01-08 MED ORDER — AMLODIPINE BESYLATE 10 MG PO TABS
ORAL_TABLET | Freq: Every day | ORAL | 1 refills | Status: DC
  Filled 2022-01-08: qty 90, fill #0
  Filled 2022-02-27: qty 90, 90d supply, fill #0

## 2022-01-08 MED ORDER — PANTOPRAZOLE SODIUM 40 MG PO TBEC
DELAYED_RELEASE_TABLET | Freq: Every day | ORAL | 2 refills | Status: DC
  Filled 2022-01-08 – 2022-01-09 (×2): qty 30, 30d supply, fill #0

## 2022-01-08 MED ORDER — LISINOPRIL 40 MG PO TABS
40.0000 mg | ORAL_TABLET | Freq: Every day | ORAL | 2 refills | Status: DC
  Filled 2022-01-08 – 2022-01-09 (×2): qty 90, 90d supply, fill #0
  Filled 2022-05-03: qty 90, 90d supply, fill #1

## 2022-01-08 MED ORDER — HYDROXYZINE PAMOATE 50 MG PO CAPS
50.0000 mg | ORAL_CAPSULE | Freq: Three times a day (TID) | ORAL | 2 refills | Status: DC | PRN
Start: 2022-01-08 — End: 2022-05-08
  Filled 2022-01-08 – 2022-02-05 (×3): qty 90, 30d supply, fill #0
  Filled 2022-03-12: qty 90, 30d supply, fill #1
  Filled 2022-04-10: qty 90, 30d supply, fill #2

## 2022-01-08 MED ORDER — PREGABALIN 150 MG PO CAPS
150.0000 mg | ORAL_CAPSULE | Freq: Two times a day (BID) | ORAL | 4 refills | Status: DC
  Filled 2022-01-08 – 2022-01-31 (×2): qty 60, 30d supply, fill #0

## 2022-01-08 MED ORDER — HYDRALAZINE HCL 50 MG PO TABS
ORAL_TABLET | Freq: Three times a day (TID) | ORAL | 2 refills | Status: DC
  Filled 2022-01-08: qty 180, fill #0
  Filled 2022-02-13: qty 180, 60d supply, fill #0

## 2022-01-08 MED ORDER — LABETALOL HCL 300 MG PO TABS
ORAL_TABLET | ORAL | 2 refills | Status: DC
  Filled 2022-01-08: qty 180, fill #0
  Filled 2022-02-13: qty 180, 60d supply, fill #0
  Filled 2022-02-27: qty 90, 30d supply, fill #0
  Filled 2022-03-29: qty 90, 30d supply, fill #1
  Filled 2022-05-03: qty 90, 30d supply, fill #2

## 2022-01-08 MED ORDER — BACLOFEN 20 MG PO TABS
20.0000 mg | ORAL_TABLET | Freq: Three times a day (TID) | ORAL | 2 refills | Status: DC
  Filled 2022-01-08 – 2022-03-12 (×3): qty 90, 30d supply, fill #0
  Filled 2022-03-12 – 2022-03-29 (×2): qty 90, 30d supply, fill #1

## 2022-01-08 NOTE — Telephone Encounter (Signed)
At request of Dr [name], call placed to patient's wife # [phone] to discuss PCS and transportation. Message left with call back requested to this CM

## 2022-01-08 NOTE — Assessment & Plan Note (Signed)
Dental referral given

## 2022-01-08 NOTE — Assessment & Plan Note (Signed)
Blood pressure currently stable no change in medications refills given

## 2022-01-08 NOTE — Assessment & Plan Note (Signed)
° ° °•   Current smoking consumption amount: 1/2 pack a day  . Dicsussion on advise to quit smoking and smoking impacts: Cardiovascular impacts  . Patient's willingness to quit: Not ready to quit  . Methods to quit smoking discussed: Nicotine replacement  . Medication management of smoking session drugs discussed: Nicotine replacement  . Resources provided:  AVS   . Setting quit date not established  . Follow-up arranged 4 mo   Time spent counseling the patient: 5 minutes  

## 2022-01-08 NOTE — Assessment & Plan Note (Signed)
Continue inhaled medications 

## 2022-01-08 NOTE — Assessment & Plan Note (Signed)
Increase dose of baclofen to 20 mg 3 times daily

## 2022-01-08 NOTE — Patient Instructions (Signed)
I gave you a list of [facility] dentist I recommend they have checkmarks after their names call 1 of those offices and make an appointment I suggest [facility] dental arts on [employer]  Complete screening labs obtained today  Prevnar 20 vaccine for pneumonia given  Refills on all medications sent and we increased her baclofen to 20 mg 3 times daily tablet  Erskine Squibb our nurse case manager is out of the office I will have her contact you regarding your difficulties with personal care services and transportation for Medicaid  Work on reducing your tobacco intake by at least 50% as we discussed  Return to see Dr. [name] 35-month

## 2022-01-08 NOTE — Assessment & Plan Note (Signed)
Currently stable.

## 2022-01-09 ENCOUNTER — Other Ambulatory Visit (HOSPITAL_COMMUNITY): Payer: Self-pay

## 2022-01-09 ENCOUNTER — Telehealth: Payer: Self-pay

## 2022-01-09 ENCOUNTER — Other Ambulatory Visit: Payer: Self-pay

## 2022-01-09 LAB — CBC WITH DIFFERENTIAL/PLATELET
Basophils Absolute: 0.1 10*3/uL (ref 0.0–0.2)
Basos: 1 %
EOS (ABSOLUTE): 0.3 10*3/uL (ref 0.0–0.4)
Eos: 4 %
Hematocrit: 35.2 % — ABNORMAL LOW (ref 37.5–51.0)
Hemoglobin: 11.8 g/dL — ABNORMAL LOW (ref 13.0–17.7)
Immature Grans (Abs): 0.1 10*3/uL (ref 0.0–0.1)
Immature Granulocytes: 1 %
Lymphocytes Absolute: 2.4 10*3/uL (ref 0.7–3.1)
Lymphs: 26 %
MCH: 28.2 pg (ref 26.6–33.0)
MCHC: 33.5 g/dL (ref 31.5–35.7)
MCV: 84 fL (ref 79–97)
Monocytes Absolute: 0.8 10*3/uL (ref 0.1–0.9)
Monocytes: 9 %
Neutrophils Absolute: 5.6 10*3/uL (ref 1.4–7.0)
Neutrophils: 59 %
Platelets: 382 10*3/uL (ref 150–450)
RBC: 4.19 x10E6/uL (ref 4.14–5.80)
RDW: 13.5 % (ref 11.6–15.4)
WBC: 9.2 10*3/uL (ref 3.4–10.8)

## 2022-01-09 LAB — COMPREHENSIVE METABOLIC PANEL
ALT: 7 IU/L (ref 0–44)
AST: 7 IU/L (ref 0–40)
Albumin/Globulin Ratio: 2 (ref 1.2–2.2)
Albumin: 4.6 g/dL (ref 3.8–4.9)
Alkaline Phosphatase: 98 IU/L (ref 44–121)
BUN/Creatinine Ratio: 12 (ref 9–20)
BUN: 11 mg/dL (ref 6–24)
Bilirubin Total: 0.2 mg/dL (ref 0.0–1.2)
CO2: 22 mmol/L (ref 20–29)
Calcium: 9.7 mg/dL (ref 8.7–10.2)
Chloride: 106 mmol/L (ref 96–106)
Creatinine, Ser: 0.91 mg/dL (ref 0.76–1.27)
Globulin, Total: 2.3 g/dL (ref 1.5–4.5)
Glucose: 93 mg/dL (ref 70–99)
Potassium: 4.4 mmol/L (ref 3.5–5.2)
Sodium: 142 mmol/L (ref 134–144)
Total Protein: 6.9 g/dL (ref 6.0–8.5)
eGFR: 101 mL/min/{1.73_m2} (ref >59)

## 2022-01-09 LAB — LIPID PANEL
Chol/HDL Ratio: 2.7 ratio (ref 0.0–5.0)
Cholesterol, Total: 96 mg/dL — ABNORMAL LOW (ref 100–199)
HDL: 35 mg/dL — ABNORMAL LOW (ref >39)
LDL Chol Calc (NIH): 45 mg/dL (ref 0–99)
Triglycerides: 75 mg/dL (ref 0–149)
VLDL Cholesterol Cal: 16 mg/dL (ref 5–40)

## 2022-01-09 NOTE — Telephone Encounter (Signed)
Pt given results per Dr. Lynelle Doctor note - 01/09/2022. Pt verbalized understanding.

## 2022-01-09 NOTE — Telephone Encounter (Signed)
This encounter was created in error - please disregard.

## 2022-01-09 NOTE — Telephone Encounter (Signed)
-----   Message from Storm Frisk, MD sent at 01/09/2022  6:24 AM EST ----- Call pt wife ,tell her cholesterol is perfect no change in cholesterol medications, rest of labs all normal

## 2022-01-09 NOTE — Telephone Encounter (Signed)
Pt was called and vm was left, Information has been sent to nurse pool.   

## 2022-01-10 NOTE — Telephone Encounter (Signed)
Call placed to patient's wife regarding PCS. She explained that the agency that provided the services has not had staff to service the patient. Instructed her to contact [facility] services and request a change in [facility] agencies.  ? ?Regarding transportation, provided her the phone number for [employer] # [phone].  ?

## 2022-01-12 ENCOUNTER — Telehealth: Payer: Self-pay

## 2022-01-12 NOTE — Telephone Encounter (Signed)
Call placed to [facility], spoke to [place] who stated that the hemi walker was shipped to patient 12/28/2021.  ?

## 2022-01-15 ENCOUNTER — Other Ambulatory Visit (HOSPITAL_COMMUNITY): Payer: Self-pay

## 2022-01-19 ENCOUNTER — Other Ambulatory Visit (HOSPITAL_COMMUNITY): Payer: Self-pay

## 2022-01-25 DIAGNOSIS — I169 Hypertensive crisis, unspecified: Secondary | ICD-10-CM | POA: Diagnosis not present

## 2022-01-26 DIAGNOSIS — I169 Hypertensive crisis, unspecified: Secondary | ICD-10-CM | POA: Diagnosis not present

## 2022-01-29 DIAGNOSIS — I169 Hypertensive crisis, unspecified: Secondary | ICD-10-CM | POA: Diagnosis not present

## 2022-01-30 DIAGNOSIS — I169 Hypertensive crisis, unspecified: Secondary | ICD-10-CM | POA: Diagnosis not present

## 2022-01-31 ENCOUNTER — Other Ambulatory Visit (HOSPITAL_COMMUNITY): Payer: Self-pay

## 2022-01-31 ENCOUNTER — Other Ambulatory Visit: Payer: Self-pay

## 2022-01-31 DIAGNOSIS — I169 Hypertensive crisis, unspecified: Secondary | ICD-10-CM | POA: Diagnosis not present

## 2022-02-01 DIAGNOSIS — I169 Hypertensive crisis, unspecified: Secondary | ICD-10-CM | POA: Diagnosis not present

## 2022-02-02 DIAGNOSIS — I169 Hypertensive crisis, unspecified: Secondary | ICD-10-CM | POA: Diagnosis not present

## 2022-02-05 ENCOUNTER — Other Ambulatory Visit (HOSPITAL_COMMUNITY): Payer: Self-pay

## 2022-02-05 ENCOUNTER — Other Ambulatory Visit: Payer: Self-pay | Admitting: Critical Care Medicine

## 2022-02-05 ENCOUNTER — Other Ambulatory Visit: Payer: Self-pay

## 2022-02-05 DIAGNOSIS — R32 Unspecified urinary incontinence: Secondary | ICD-10-CM | POA: Diagnosis not present

## 2022-02-05 DIAGNOSIS — R8290 Unspecified abnormal findings in urine: Secondary | ICD-10-CM | POA: Diagnosis not present

## 2022-02-05 DIAGNOSIS — I169 Hypertensive crisis, unspecified: Secondary | ICD-10-CM | POA: Diagnosis not present

## 2022-02-05 MED ORDER — PREGABALIN 150 MG PO CAPS
150.0000 mg | ORAL_CAPSULE | Freq: Two times a day (BID) | ORAL | 4 refills | Status: DC
  Filled 2022-02-05: qty 60, 30d supply, fill #0
  Filled 2022-03-12: qty 60, 30d supply, fill #1
  Filled 2022-04-10: qty 60, 30d supply, fill #2

## 2022-02-06 ENCOUNTER — Telehealth: Payer: Self-pay

## 2022-02-06 NOTE — Telephone Encounter (Signed)
Call placed to [facility], spoke to [facility] who confirmed that the patient received his hemi walker 12/28/2021.  ?

## 2022-02-09 ENCOUNTER — Ambulatory Visit: Payer: Self-pay

## 2022-02-09 DIAGNOSIS — I169 Hypertensive crisis, unspecified: Secondary | ICD-10-CM | POA: Diagnosis not present

## 2022-02-09 NOTE — Telephone Encounter (Signed)
Vomiting and diarrhea, pt's wife has questions  ?Best contact: [phone]  ? ? ?Chief Complaint: Vomiting,diarrhea. Asking for nausea medication be sent to [place] [facility]. ?Symptoms: Above ?Frequency: Started yesterday ?Pertinent Negatives: Patient denies fever ?Disposition: [] ED /[] Urgent Care (no appt availability in office) / [] Appointment(In office/virtual)/ []  [facility]/ [] Home Care/ [] Refused Recommended Disposition /[] [facility]/ []  Follow-up with PCP ?Additional Notes: Asking for something for nausea.  ?Answer Assessment - Initial Assessment Questions ?1. VOMITING SEVERITY: "How many times have you vomited in the past 24 hours?"  ?   - MILD:  1 - 2 times/day ?   - MODERATE: 3 - 5 times/day, decreased oral intake without significant weight loss or symptoms of dehydration ?   - SEVERE: 6 or more times/day, vomits everything or nearly everything, with significant weight loss, symptoms of dehydration  ?    3 ?2. ONSET: "When did the vomiting begin?"  ?    Today ?3. FLUIDS: "What fluids or food have you vomited up today?" "Have you been able to keep any fluids down?" ?    Keeping fluids down ?4. ABDOMINAL PAIN: "Are your having any abdominal pain?" If yes : "How bad is it and what does it feel like?" (e.g., crampy, dull, intermittent, constant)  ?    No ?5. DIARRHEA: "Is there any diarrhea?" If Yes, ask: "How many times today?"  ?    Yes   Today - 1 ?6. CONTACTS: "Is there anyone else in the family with the same symptoms?"  ?    No ?7. CAUSE: "What do you think is causing your vomiting?" ?    Unsure ?8. HYDRATION STATUS: "Any signs of dehydration?" (e.g., dry mouth [not only dry lips], too weak to stand) "When did you last urinate?" ?    Urinating ?9. OTHER SYMPTOMS: "Do you have any other symptoms?" (e.g., fever, headache, vertigo, vomiting blood or coffee grounds, recent head injury) ?    No ?10. PREGNANCY: "Is there any chance you are pregnant?" "When was  your last menstrual period?" ?      N/a ? ?Protocols used: Vomiting-A-AH ? ?

## 2022-02-12 MED ORDER — ONDANSETRON HCL 4 MG PO TABS
4.0000 mg | ORAL_TABLET | Freq: Three times a day (TID) | ORAL | 0 refills | Status: DC | PRN
  Filled 2022-02-12 – 2022-02-13 (×2): qty 20, 7d supply, fill #0

## 2022-02-12 NOTE — Addendum Note (Signed)
Addended byHoy Register on: 02/12/2022 05:42 PM ? ? Modules accepted: Orders ? ?

## 2022-02-12 NOTE — Telephone Encounter (Signed)
Designeated party release sighned. Patient gives permission Jason Romero, called pt made aware his wife [name] of RX sent to pharmacy ?

## 2022-02-12 NOTE — Telephone Encounter (Signed)
I have sent nausea medication to the pharmacy as requested.  If symptoms persist he needs a visit. ?  ?

## 2022-02-13 ENCOUNTER — Other Ambulatory Visit (HOSPITAL_COMMUNITY): Payer: Self-pay

## 2022-02-13 ENCOUNTER — Other Ambulatory Visit: Payer: Self-pay

## 2022-02-15 ENCOUNTER — Other Ambulatory Visit (HOSPITAL_COMMUNITY): Payer: Self-pay

## 2022-02-15 ENCOUNTER — Other Ambulatory Visit: Payer: Self-pay | Admitting: Critical Care Medicine

## 2022-02-15 DIAGNOSIS — I619 Nontraumatic intracerebral hemorrhage, unspecified: Secondary | ICD-10-CM

## 2022-02-15 DIAGNOSIS — I10 Essential (primary) hypertension: Secondary | ICD-10-CM

## 2022-02-15 MED ORDER — HYDRALAZINE HCL 50 MG PO TABS
ORAL_TABLET | Freq: Three times a day (TID) | ORAL | 1 refills | Status: DC
  Filled 2022-02-15: qty 180, 60d supply, fill #0
  Filled 2022-04-19: qty 90, 30d supply, fill #1

## 2022-02-15 NOTE — Telephone Encounter (Signed)
Change of pharmacy ? ?Requested Prescriptions  ?Pending Prescriptions Disp Refills  ?? hydrALAZINE (APRESOLINE) 50 MG tablet 180 tablet 1  ?  Sig: Take 1 tablet  by mouth 3 (three) times daily.  ?  ? Cardiovascular:  Vasodilators Failed - 02/15/2022  9:45 AM  ?  ?  Failed - HCT in normal range and within 360 days  ?  Hematocrit  ?Date Value Ref Range Status  ?01/08/2022 35.2 (L) 37.5 - 51.0 % Final  ?   ?  ?  Failed - HGB in normal range and within 360 days  ?  Hemoglobin  ?Date Value Ref Range Status  ?01/08/2022 11.8 (L) 13.0 - 17.7 g/dL Final  ?   ?  ?  Failed - ANA Screen, Ifa, Serum in normal range and within 360 days  ?  No results found for: ANA, ANATITER, LABANTI   ?  ?  Passed - RBC in normal range and within 360 days  ?  RBC  ?Date Value Ref Range Status  ?01/08/2022 4.19 4.14 - 5.80 x10E6/uL Final  ?02/28/2021 4.74 4.20 - 5.80 Million/uL Final  ?   ?  ?  Passed - WBC in normal range and within 360 days  ?  WBC  ?Date Value Ref Range Status  ?01/08/2022 9.2 3.4 - 10.8 x10E3/uL Final  ?02/28/2021 8.2 3.8 - 10.8 Thousand/uL Final  ?   ?  ?  Passed - PLT in normal range and within 360 days  ?  Platelets  ?Date Value Ref Range Status  ?01/08/2022 382 150 - 450 x10E3/uL Final  ?   ?  ?  Passed - Last BP in normal range  ?  BP Readings from Last 1 Encounters:  ?01/08/22 129/87  ?   ?  ?  Passed - Valid encounter within last 12 months  ?  Recent Outpatient Visits   ?      ? 1 month ago Essential hypertension  ? [place] Elsie Stain, MD  ? 4 months ago Dental abscess  ? [place] Elsie Stain, MD  ? 4 months ago Essential hypertension  ? [place] Elsie Stain, MD  ? 5 months ago Essential hypertension  ? [place] Elsie Stain, MD  ? 7 months ago Essential hypertension  ? [place] Elsie Stain, MD  ?  ?  ?Future Appointments   ?         ? In 2 months Elsie Stain, MD [place]  ?  ? ?  ?  ?  ? ? ?

## 2022-02-15 NOTE — Telephone Encounter (Signed)
Requesting 90 days supply per insurance

## 2022-02-15 NOTE — Telephone Encounter (Signed)
Medication Refill - Medication:  ?hydrALAZINE (APRESOLINE) 50 MG tablet  ? ?Has the patient contacted their pharmacy? Yes.   ? ?*Pharmacy called on behalf of pt stating insurance will only cover 90 day supply* ? ?Preferred Pharmacy (with phone number or street name):  ?[facility]  ?[address] [place] [address]  ?Phone:  [phone]  Fax:  [phone]  ? ?Has the patient been seen for an appointment in the last year OR does the patient have an upcoming appointment? Yes.   ? ?Agent: Please be advised that RX refills may take up to 3 business days. We ask that you follow-up with your pharmacy. ?

## 2022-02-16 ENCOUNTER — Other Ambulatory Visit (HOSPITAL_COMMUNITY): Payer: Self-pay

## 2022-02-17 ENCOUNTER — Other Ambulatory Visit (HOSPITAL_COMMUNITY): Payer: Self-pay

## 2022-02-19 ENCOUNTER — Other Ambulatory Visit (HOSPITAL_COMMUNITY): Payer: Self-pay

## 2022-02-27 ENCOUNTER — Other Ambulatory Visit (HOSPITAL_COMMUNITY): Payer: Self-pay

## 2022-02-27 DIAGNOSIS — I169 Hypertensive crisis, unspecified: Secondary | ICD-10-CM | POA: Diagnosis not present

## 2022-02-28 ENCOUNTER — Other Ambulatory Visit (HOSPITAL_COMMUNITY): Payer: Self-pay

## 2022-02-28 DIAGNOSIS — I169 Hypertensive crisis, unspecified: Secondary | ICD-10-CM | POA: Diagnosis not present

## 2022-03-02 DIAGNOSIS — I169 Hypertensive crisis, unspecified: Secondary | ICD-10-CM | POA: Diagnosis not present

## 2022-03-05 DIAGNOSIS — I169 Hypertensive crisis, unspecified: Secondary | ICD-10-CM | POA: Diagnosis not present

## 2022-03-06 DIAGNOSIS — I169 Hypertensive crisis, unspecified: Secondary | ICD-10-CM | POA: Diagnosis not present

## 2022-03-07 DIAGNOSIS — I169 Hypertensive crisis, unspecified: Secondary | ICD-10-CM | POA: Diagnosis not present

## 2022-03-08 DIAGNOSIS — I169 Hypertensive crisis, unspecified: Secondary | ICD-10-CM | POA: Diagnosis not present

## 2022-03-09 DIAGNOSIS — I169 Hypertensive crisis, unspecified: Secondary | ICD-10-CM | POA: Diagnosis not present

## 2022-03-12 ENCOUNTER — Other Ambulatory Visit (HOSPITAL_COMMUNITY): Payer: Self-pay

## 2022-03-12 DIAGNOSIS — I169 Hypertensive crisis, unspecified: Secondary | ICD-10-CM | POA: Diagnosis not present

## 2022-03-13 ENCOUNTER — Other Ambulatory Visit (HOSPITAL_COMMUNITY): Payer: Self-pay

## 2022-03-14 DIAGNOSIS — I169 Hypertensive crisis, unspecified: Secondary | ICD-10-CM | POA: Diagnosis not present

## 2022-03-15 ENCOUNTER — Ambulatory Visit: Payer: Self-pay

## 2022-03-15 ENCOUNTER — Emergency Department (HOSPITAL_COMMUNITY): Payer: Medicaid Other

## 2022-03-15 ENCOUNTER — Other Ambulatory Visit: Payer: Self-pay

## 2022-03-15 ENCOUNTER — Emergency Department (HOSPITAL_COMMUNITY)
Admission: EM | Admit: 2022-03-15 | Discharge: 2022-03-15 | Disposition: A | Discharge status: Home / Self Care | Payer: Medicaid Other | Attending: Emergency Medicine | Admitting: Emergency Medicine

## 2022-03-15 ENCOUNTER — Encounter (HOSPITAL_COMMUNITY): Payer: Self-pay | Admitting: Pharmacy Technician

## 2022-03-15 DIAGNOSIS — I959 Hypotension, unspecified: Secondary | ICD-10-CM | POA: Insufficient documentation

## 2022-03-15 DIAGNOSIS — J189 Pneumonia, unspecified organism: Secondary | ICD-10-CM | POA: Diagnosis not present

## 2022-03-15 DIAGNOSIS — Z79899 Other long term (current) drug therapy: Secondary | ICD-10-CM | POA: Diagnosis not present

## 2022-03-15 DIAGNOSIS — R4182 Altered mental status, unspecified: Secondary | ICD-10-CM | POA: Diagnosis not present

## 2022-03-15 DIAGNOSIS — R112 Nausea with vomiting, unspecified: Secondary | ICD-10-CM | POA: Insufficient documentation

## 2022-03-15 DIAGNOSIS — I10 Essential (primary) hypertension: Secondary | ICD-10-CM | POA: Insufficient documentation

## 2022-03-15 DIAGNOSIS — I169 Hypertensive crisis, unspecified: Secondary | ICD-10-CM | POA: Diagnosis not present

## 2022-03-15 DIAGNOSIS — R7989 Other specified abnormal findings of blood chemistry: Secondary | ICD-10-CM | POA: Diagnosis not present

## 2022-03-15 DIAGNOSIS — N179 Acute kidney failure, unspecified: Secondary | ICD-10-CM | POA: Insufficient documentation

## 2022-03-15 DIAGNOSIS — R1111 Vomiting without nausea: Secondary | ICD-10-CM | POA: Diagnosis not present

## 2022-03-15 HISTORY — DX: Cerebral infarction, unspecified: I63.9

## 2022-03-15 LAB — URINALYSIS, ROUTINE W REFLEX MICROSCOPIC
Bilirubin Urine: NEGATIVE
Glucose, UA: NEGATIVE mg/dL
Hgb urine dipstick: NEGATIVE
Ketones, ur: NEGATIVE mg/dL
Leukocytes,Ua: NEGATIVE
Nitrite: NEGATIVE
Protein, ur: NEGATIVE mg/dL
Specific Gravity, Urine: 1.005 (ref 1.005–1.030)
pH: 5 (ref 5.0–8.0)

## 2022-03-15 LAB — COMPREHENSIVE METABOLIC PANEL
ALT: 11 U/L (ref 0–44)
AST: 12 U/L — ABNORMAL LOW (ref 15–41)
Albumin: 3.9 g/dL (ref 3.5–5.0)
Alkaline Phosphatase: 75 U/L (ref 38–126)
Anion gap: 11 (ref 5–15)
BUN: 26 mg/dL — ABNORMAL HIGH (ref 6–20)
CO2: 19 mmol/L — ABNORMAL LOW (ref 22–32)
Calcium: 8.8 mg/dL — ABNORMAL LOW (ref 8.9–10.3)
Chloride: 107 mmol/L (ref 98–111)
Creatinine, Ser: 1.43 mg/dL — ABNORMAL HIGH (ref 0.61–1.24)
GFR, Estimated: 59 mL/min — ABNORMAL LOW (ref >60)
Glucose, Bld: 105 mg/dL — ABNORMAL HIGH (ref 70–99)
Potassium: 3.9 mmol/L (ref 3.5–5.1)
Sodium: 137 mmol/L (ref 135–145)
Total Bilirubin: 0.5 mg/dL (ref 0.3–1.2)
Total Protein: 7.1 g/dL (ref 6.5–8.1)

## 2022-03-15 LAB — CBC WITH DIFFERENTIAL/PLATELET
Abs Immature Granulocytes: 0.07 10*3/uL (ref 0.00–0.07)
Basophils Absolute: 0 10*3/uL (ref 0.0–0.1)
Basophils Relative: 0 %
Eosinophils Absolute: 0.1 10*3/uL (ref 0.0–0.5)
Eosinophils Relative: 1 %
HCT: 38.1 % — ABNORMAL LOW (ref 39.0–52.0)
Hemoglobin: 12.7 g/dL — ABNORMAL LOW (ref 13.0–17.0)
Immature Granulocytes: 1 %
Lymphocytes Relative: 13 %
Lymphs Abs: 1.2 10*3/uL (ref 0.7–4.0)
MCH: 29.2 pg (ref 26.0–34.0)
MCHC: 33.3 g/dL (ref 30.0–36.0)
MCV: 87.6 fL (ref 80.0–100.0)
Monocytes Absolute: 0.5 10*3/uL (ref 0.1–1.0)
Monocytes Relative: 5 %
Neutro Abs: 7.4 10*3/uL (ref 1.7–7.7)
Neutrophils Relative %: 80 %
Platelets: 240 10*3/uL (ref 150–400)
RBC: 4.35 MIL/uL (ref 4.22–5.81)
RDW: 14 % (ref 11.5–15.5)
WBC: 9.3 10*3/uL (ref 4.0–10.5)
nRBC: 0 % (ref 0.0–0.2)

## 2022-03-15 MED ORDER — AZITHROMYCIN 250 MG PO TABS: ORAL_TABLET | ORAL | 0 refills | Status: DC

## 2022-03-15 MED ORDER — CEFTRIAXONE SODIUM 1 G IJ SOLR
1.0000 g | Freq: Once | INTRAMUSCULAR | Status: AC
  Administered 2022-03-15: 1 g via INTRAVENOUS
  Filled 2022-03-15: qty 10

## 2022-03-15 MED ORDER — ONDANSETRON HCL 4 MG/2ML IJ SOLN
4.0000 mg | Freq: Once | INTRAMUSCULAR | Status: AC
  Administered 2022-03-15: 4 mg via INTRAVENOUS
  Filled 2022-03-15: qty 2

## 2022-03-15 MED ORDER — CEPHALEXIN 500 MG PO CAPS: 500.0000 mg | ORAL_CAPSULE | Freq: Three times a day (TID) | ORAL | 0 refills | Status: DC

## 2022-03-15 MED ORDER — SODIUM CHLORIDE 0.9 % IV BOLUS
1000.0000 mL | Freq: Once | INTRAVENOUS | Status: AC
Start: 2022-03-15 — End: 2022-03-15
  Administered 2022-03-15: 1000 mL via INTRAVENOUS

## 2022-03-15 NOTE — Telephone Encounter (Signed)
?  Chief Complaint: hypotension ?Symptoms: BP 97/70, 80/77, N/V, dizziness ?Frequency: 2 days ?Pertinent Negatives: NA ?Disposition: [x] ED /[] Urgent Care (no appt availability in office) / [] Appointment(In office/virtual)/ []  [facility]/ [] Home Care/ [] Refused Recommended Disposition /[] [facility]/ []  Follow-up with PCP ?Additional Notes: pt's wife called in and states above symptoms going on since yesterday and pt unable to keep water down plus urine is very dark and pt is unable to get up and walk around right now. I advised her to call EMS to come out and eval pt. She says they dont have a car right now so they would be able to take him. I advised her that EMS would be able to transport to ED for her. Wife states she would go ahead and call.  ? ?Reason for Disposition ? [id number] Systolic BP < 90 AND [id number] dizzy, lightheaded, or weak ? ?Answer Assessment - Initial Assessment Questions ?1. BLOOD PRESSURE: "What is the blood pressure?" "Did you take at least two measurements 5 minutes apart?" ?    97/70, 80/77 ?2. ONSET: "When did you take your blood pressure?" ?    Taken today ?4. HISTORY: "Do you have a history of low blood pressure?" "What is your blood pressure normally?" ?    Had HTN, had CVA 2 years ago  ?5. MEDICATIONS: "Are you taking any medications for blood pressure?" If Yes, ask: "Have they been changed recently?" ?    On BP Meds ?7. OTHER SYMPTOMS: "Have you been sick recently?" "Have you had a recent injury?" ?    Got nauseous yesterday and today dizzy and laying down ? ?Protocols used: Blood Pressure - Low-A-AH ? ?

## 2022-03-15 NOTE — ED Notes (Signed)
Patient Alert and oriented to baseline. Stable and ambulatory to baseline. Patient verbalized understanding of the discharge instructions.  Patient belongings were taken by the patient.   

## 2022-03-15 NOTE — Telephone Encounter (Signed)
Patient was seen in ED. 

## 2022-03-15 NOTE — ED Provider Notes (Signed)
?[facility] DEPT ?Provider Note ? ? ?CSN: [id number] ?Arrival date & time: 03/15/22  1330 ? ?  ? ?History ? ?Chief Complaint  ?Patient presents with  ? Hypotension  ? Emesis  ? ? ?Jason Romero is a 51 y.o. male history of hypertension, previous stroke with right-sided paralysis, bedbound here presenting with hypotension and vomiting.  Patient has nausea vomiting since yesterday.  Patient was noted to be weak and has dark urine.  Wife took blood pressure at home and was in the 80s. Patient is bedbound and paralyzed on the right side.  Patient denies any trouble speaking but just less responsive than usual. Wife called the office and patient was sent here for evaluation ? ?The history is provided by the patient.  ? ?  ? ?Home Medications ?Prior to Admission medications   ?Medication Sig Start Date End Date Taking? Authorizing Provider  ?acetaminophen (TYLENOL) 325 MG tablet Take 2 tablets (650 mg total) by mouth every 4 (four) hours as needed for mild pain (or temp > 37.5 C (99.5 F)). 10/18/20   Angiulli, Lavon Paganini, PA-C  ?amLODipine (NORVASC) 10 MG tablet TAKE 1 TABLET (10 MG TOTAL) BY MOUTH DAILY. 01/08/22 01/08/23  Elsie Stain, MD  ?amoxicillin-clavulanate (AUGMENTIN) 875-125 MG tablet Take 1 tablet by mouth 2 (two) times daily. 09/27/21   Elsie Stain, MD  ?atorvastatin (LIPITOR) 20 MG tablet Take 1 tablet (20 mg total) by mouth daily. 01/08/22   Elsie Stain, MD  ?baclofen (LIORESAL) 20 MG tablet Take 1 tablet (20 mg total) by mouth 3 (three) times daily. 01/08/22   Elsie Stain, MD  ?Blood Pressure Monitoring (BLOOD PRESSURE KIT) DEVI Use to monitor blood pressure 06/01/21   Elsie Stain, MD  ?hydrALAZINE (APRESOLINE) 50 MG tablet Take 1 tablet  by mouth 3 (three) times daily. 02/15/22 02/15/23  Elsie Stain, MD  ?hydrOXYzine (VISTARIL) 50 MG capsule Take 1 capsule (50 mg total) by mouth every 8 (eight) hours as needed for anxiety. 01/08/22   Elsie Stain, MD   ?labetalol (NORMODYNE) 300 MG tablet TAKE 1 TABLET (300 MG TOTAL) BY MOUTH EVERY 8 (EIGHT) HOURS. 01/08/22 01/08/23  Elsie Stain, MD  ?lisinopril (ZESTRIL) 40 MG tablet Take 1 tablet (40 mg total) by mouth daily. 01/08/22 04/16/22  Elsie Stain, MD  ?ondansetron (ZOFRAN) 4 MG tablet Take 1 tablet (4 mg total) by mouth every 8 (eight) hours as needed for nausea or vomiting. 02/12/22   Charlott Rakes, MD  ?pantoprazole (PROTONIX) 40 MG tablet TAKE 1 TABLET (40 MG TOTAL) BY MOUTH DAILY. 01/08/22 01/08/23  Elsie Stain, MD  ?pregabalin (LYRICA) 150 MG capsule Take 1 capsule (150 mg total) by mouth 2 (two) times daily. 02/05/22   Elsie Stain, MD  ?senna (SENOKOT) 8.6 MG TABS tablet Take 1 tablet (8.6 mg total) by mouth 2 (two) times daily. 09/21/21   Elsie Stain, MD  ?spironolactone (ALDACTONE) 25 MG tablet Take 1 tablet (25 mg total) by mouth daily. 01/08/22   Elsie Stain, MD  ?traZODone (DESYREL) 100 MG tablet Take 1 tablet (100 mg total) by mouth at bedtime as needed for sleep. 01/08/22   Elsie Stain, MD  ?   ? ?Allergies    ?Hydrocodone and Ibuprofen   ? ?Review of Systems   ?Review of Systems  ?Gastrointestinal:  Positive for vomiting.  ?All other systems reviewed and are negative. ? ?Physical Exam ?Updated Vital Signs ?BP 128/89  Pulse 82   Temp (!) 97.4 ?F (36.3 ?C) (Oral)   Resp 16   SpO2 99%  ?Physical Exam ?Vitals and nursing note reviewed.  ?Constitutional:   ?   Comments: Chronically ill, dehydrated  ?HENT:  ?   Head: Normocephalic.  ?   Nose: Nose normal.  ?   Mouth/Throat:  ?   Mouth: Mucous membranes are dry.  ?Eyes:  ?   Extraocular Movements: Extraocular movements intact.  ?   Pupils: Pupils are equal, round, and reactive to light.  ?Cardiovascular:  ?   Rate and Rhythm: Normal rate and regular rhythm.  ?   Pulses: Normal pulses.  ?   Heart sounds: Normal heart sounds.  ?Pulmonary:  ?   Effort: Pulmonary effort is normal.  ?   Comments: Diminished  bilaterally ?Abdominal:  ?   General: Abdomen is flat.  ?   Palpations: Abdomen is soft.  ?Musculoskeletal:  ?   Cervical back: Normal range of motion and neck supple.  ?   Comments: Stage I decub ulcer  ?Skin: ?   General: Skin is warm.  ?   Capillary Refill: Capillary refill takes less than 2 seconds.  ?Neurological:  ?   Mental Status: He is alert.  ?   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 No obvious facial droop.  ?Psychiatric:     ?   Mood and Affect: Mood normal.     ?   Behavior: Behavior normal.  ? ? ?ED Results / Procedures / Treatments   ?Labs ?(all labs ordered are listed, but only abnormal results are displayed) ?Labs Reviewed  ?CBC WITH DIFFERENTIAL/PLATELET - Abnormal; Notable for the following components:  ?    Result Value  ? Hemoglobin 12.7 (*)   ? HCT 38.1 (*)   ? All other components within normal limits  ?COMPREHENSIVE METABOLIC PANEL - Abnormal; Notable for the following components:  ? CO2 19 (*)   ? Glucose, Bld 105 (*)   ? BUN 26 (*)   ? Creatinine, Ser 1.43 (*)   ? Calcium 8.8 (*)   ? AST 12 (*)   ? GFR, Estimated 59 (*)   ? All other components within normal limits  ?URINALYSIS, ROUTINE W REFLEX MICROSCOPIC - Abnormal; Notable for the following components:  ? Color, Urine STRAW (*)   ? All other components within normal limits  ?URINE CULTURE  ? ? ?EKG ?EKG Interpretation ? ?Date/Time:  Thursday Mar 15 2022 15:16:55 EDT ?Ventricular Rate:  83 ?PR Interval:    ?QRS Duration: 106 ?QT Interval:  359 ?QTC Calculation: 422 ?R Axis:   49 ?Text Interpretation: Atrial fibrillation Low voltage, precordial leads Borderline repolarization abnormality rate slower since previous Confirmed by Yao, David H ([id number]) on 03/15/2022 4:43:16 PM ? ?Radiology ?CT HEAD WO CONTRAST (5MM) ? ?Result Date: 03/15/2022 ?CLINICAL DATA:  Mental status change EXAM: CT HEAD WITHOUT CONTRAST TECHNIQUE: Contiguous axial images were obtained from the base of the skull through the vertex without intravenous  contrast. RADIATION DOSE REDUCTION: This exam was performed according to the departmental dose-optimization program which includes automated exposure control, adjustment of the mA and/or kV according to patient size and/or use of iterative reconstruction technique. COMPARISON:  CT 09/15/2020 FINDINGS: Brain: No acute territorial infarction, hemorrhage, or intracranial mass. Interval encephalomalacia within the left external capsule and adjacent white matter at the site of prior hemorrhage. Mild ex vacuo dilatation of the left lateral ventricle. Mild atrophy. Vascular: No hyperdense vessels.  Carotid   vascular calcification Skull: Normal. Negative for fracture or focal lesion. Sinuses/Orbits: No acute finding. Other: None IMPRESSION: 1. No CT evidence for acute intracranial abnormality. 2. Interval encephalomalacia within the left basal ganglia and white matter at the site of previous hemorrhage. Electronically Signed   By: Kim  Fujinaga M.D.   On: 03/15/2022 16:05  ? ?DG Chest Port 1 View ? ?Result Date: 03/15/2022 ?CLINICAL DATA:  Provided history: Hypotensive. Additional history provided: Nausea and vomiting. EXAM: PORTABLE CHEST 1 VIEW COMPARISON:  Prior chest radiographs 09/12/2020 and earlier. FINDINGS: Heart size at the upper limits of normal, unchanged. Mild ill-defined opacity within the left lung base, favored to reflect atelectasis. No appreciable airspace consolidation within the right lung. No evidence of pleural effusion or pneumothorax. Azygos fissure. No acute bony abnormality identified. IMPRESSION: Mild ill-defined opacity within the left lung base, favored to reflect atelectasis. The lungs are otherwise clear. Electronically Signed   By: Kyle  Golden D.O.   On: 03/15/2022 15:53   ? ?Procedures ?Procedures  ? ? ?Medications Ordered in ED ?Medications  ?cefTRIAXone (ROCEPHIN) 1 g in sodium chloride 0.9 % 100 mL IVPB (has no administration in time range)  ?sodium chloride 0.9 % bolus 1,000 mL (1,000 mLs  Intravenous New Bag/Given 03/15/22 1532)  ?ondansetron (ZOFRAN) injection 4 mg (4 mg Intravenous Given 03/15/22 1532)  ? ? ?ED Course/ Medical Decision Making/ A&P ?  ?                        ?Medical Decision Making ?[facility] H [place]

## 2022-03-15 NOTE — Discharge Instructions (Signed)
You likely have some dehydration and also pneumonia. ? ?Please take Keflex and Z-Pak as prescribed. ? ?Keep him hydrated. ? ?Your kidney function is slightly abnormal from dehydration.  I recommend that you repeat kidney function test in a week with your doctor ? ?Return to ER if you have worse dizziness, passing out, fever, blood pressure less than 90 ?

## 2022-03-15 NOTE — ED Triage Notes (Signed)
Pt bib ems from home with nausea vomiting since yesterday. Pt found to be hypotensive in the 80's systolic. Per wife, pt more lethargic than normal, and slower to respond to questions. Alert and oriented. Hx CVA with residual R sided weakness.  ?

## 2022-03-16 ENCOUNTER — Telehealth: Payer: Self-pay

## 2022-03-16 DIAGNOSIS — I169 Hypertensive crisis, unspecified: Secondary | ICD-10-CM | POA: Diagnosis not present

## 2022-03-16 LAB — URINE CULTURE: Culture: NO GROWTH

## 2022-03-16 NOTE — Telephone Encounter (Signed)
Transition Care Management Unsuccessful Follow-up Telephone Call ? ?Date of discharge and from where:  03/15/2022-[facility] ? ?Attempts:  1st Attempt ? ?Reason for unsuccessful TCM follow-up call:  Unable to reach patient ? ?  ?

## 2022-03-19 DIAGNOSIS — I169 Hypertensive crisis, unspecified: Secondary | ICD-10-CM | POA: Diagnosis not present

## 2022-03-19 NOTE — Telephone Encounter (Signed)
Transition Care Management Follow-up Telephone Call ?Date of discharge and from where: 03/15/2022 from [facility] ?How have you been since you were released from the hospital? Patient stated that he is feeling better. Patient did not have any questions or concerns at this time.  ?Any questions or concerns? No ? ?Items Reviewed: ?Did the pt receive and understand the discharge instructions provided? Yes  ?Medications obtained and verified? Yes  ?Other? No  ?Any new allergies since your discharge? No  ?Dietary orders reviewed? No ?Do you have support at home? Yes  ? ?Functional Questionnaire: (I = Independent and D = Dependent) ?ADLs: I ? ?Bathing/Dressing- I ? ?Meal Prep- I ? ?Eating- I ? ?Maintaining continence- I ? ?Transferring/Ambulation- I ? ?Managing Meds- I ? ? ?Follow up appointments reviewed: ? ?PCP Hospital f/u appt confirmed? Yes  Scheduled to see Asencion Noble, MD on 05/08/2022 @ 10:30am. ?[facility] f/u appt confirmed? No   ?Are transportation arrangements needed? No  ?If their condition worsens, is the pt aware to call PCP or go to the Emergency Dept.? Yes ?Was the patient provided with contact information for the PCP's office or ED? Yes ?Was to pt encouraged to call back with questions or concerns? Yes ? ?

## 2022-03-20 DIAGNOSIS — I169 Hypertensive crisis, unspecified: Secondary | ICD-10-CM | POA: Diagnosis not present

## 2022-03-21 ENCOUNTER — Telehealth: Payer: Self-pay

## 2022-03-21 DIAGNOSIS — I169 Hypertensive crisis, unspecified: Secondary | ICD-10-CM | POA: Diagnosis not present

## 2022-03-21 NOTE — Telephone Encounter (Signed)
Called and left vm to make mychart or tele appt for supply  ?

## 2022-03-21 NOTE — Telephone Encounter (Addendum)
Wife called to return call. ?Advised wife pt of message. ?She states pt has enough supplies to get him through until he sees Dr [name] on 6/27. ? ?Also as FYI, she took pt to the ED as instructed last week.  Pt had a touch of PNA, along with several other things.  But is doing good now. ? ?Nothing further needed at this time. ?

## 2022-03-21 NOTE — Telephone Encounter (Signed)
Noted will have his paperwork for next visit ?

## 2022-03-22 DIAGNOSIS — I169 Hypertensive crisis, unspecified: Secondary | ICD-10-CM | POA: Diagnosis not present

## 2022-03-23 DIAGNOSIS — I169 Hypertensive crisis, unspecified: Secondary | ICD-10-CM | POA: Diagnosis not present

## 2022-03-26 DIAGNOSIS — I169 Hypertensive crisis, unspecified: Secondary | ICD-10-CM | POA: Diagnosis not present

## 2022-03-28 DIAGNOSIS — I169 Hypertensive crisis, unspecified: Secondary | ICD-10-CM | POA: Diagnosis not present

## 2022-03-29 ENCOUNTER — Other Ambulatory Visit (HOSPITAL_COMMUNITY): Payer: Self-pay

## 2022-03-29 DIAGNOSIS — I169 Hypertensive crisis, unspecified: Secondary | ICD-10-CM | POA: Diagnosis not present

## 2022-03-30 ENCOUNTER — Other Ambulatory Visit (HOSPITAL_COMMUNITY): Payer: Self-pay

## 2022-03-30 DIAGNOSIS — I169 Hypertensive crisis, unspecified: Secondary | ICD-10-CM | POA: Diagnosis not present

## 2022-04-03 ENCOUNTER — Other Ambulatory Visit (HOSPITAL_COMMUNITY): Payer: Self-pay

## 2022-04-10 ENCOUNTER — Other Ambulatory Visit (HOSPITAL_COMMUNITY): Payer: Self-pay

## 2022-04-19 ENCOUNTER — Other Ambulatory Visit (HOSPITAL_COMMUNITY): Payer: Self-pay

## 2022-05-03 ENCOUNTER — Other Ambulatory Visit (HOSPITAL_COMMUNITY): Payer: Self-pay

## 2022-05-03 DIAGNOSIS — I169 Hypertensive crisis, unspecified: Secondary | ICD-10-CM | POA: Diagnosis not present

## 2022-05-04 ENCOUNTER — Other Ambulatory Visit (HOSPITAL_COMMUNITY): Payer: Self-pay

## 2022-05-04 DIAGNOSIS — I169 Hypertensive crisis, unspecified: Secondary | ICD-10-CM | POA: Diagnosis not present

## 2022-05-07 DIAGNOSIS — I169 Hypertensive crisis, unspecified: Secondary | ICD-10-CM | POA: Diagnosis not present

## 2022-05-08 ENCOUNTER — Encounter (HOSPITAL_COMMUNITY): Payer: Self-pay | Admitting: Pharmacist

## 2022-05-08 ENCOUNTER — Other Ambulatory Visit: Payer: Self-pay

## 2022-05-08 ENCOUNTER — Other Ambulatory Visit (HOSPITAL_COMMUNITY): Payer: Self-pay

## 2022-05-08 ENCOUNTER — Ambulatory Visit: Payer: Medicaid Other | Attending: Critical Care Medicine | Admitting: Critical Care Medicine

## 2022-05-08 ENCOUNTER — Encounter: Payer: Self-pay | Admitting: Critical Care Medicine

## 2022-05-08 VITALS — BP 99/65 | HR 88

## 2022-05-08 DIAGNOSIS — I10 Essential (primary) hypertension: Secondary | ICD-10-CM | POA: Diagnosis not present

## 2022-05-08 DIAGNOSIS — K219 Gastro-esophageal reflux disease without esophagitis: Secondary | ICD-10-CM

## 2022-05-08 DIAGNOSIS — H6123 Impacted cerumen, bilateral: Secondary | ICD-10-CM

## 2022-05-08 DIAGNOSIS — I619 Nontraumatic intracerebral hemorrhage, unspecified: Secondary | ICD-10-CM

## 2022-05-08 DIAGNOSIS — G8929 Other chronic pain: Secondary | ICD-10-CM

## 2022-05-08 DIAGNOSIS — I69391 Dysphagia following cerebral infarction: Secondary | ICD-10-CM | POA: Diagnosis not present

## 2022-05-08 DIAGNOSIS — K047 Periapical abscess without sinus: Secondary | ICD-10-CM

## 2022-05-08 DIAGNOSIS — Z720 Tobacco use: Secondary | ICD-10-CM | POA: Diagnosis not present

## 2022-05-08 DIAGNOSIS — M545 Low back pain, unspecified: Secondary | ICD-10-CM | POA: Diagnosis not present

## 2022-05-08 DIAGNOSIS — B182 Chronic viral hepatitis C: Secondary | ICD-10-CM | POA: Diagnosis not present

## 2022-05-08 HISTORY — DX: Impacted cerumen, bilateral: H61.23

## 2022-05-08 MED ORDER — PREGABALIN 150 MG PO CAPS
150.0000 mg | ORAL_CAPSULE | Freq: Two times a day (BID) | ORAL | 4 refills | Status: DC
  Filled 2022-05-08: qty 60, 30d supply, fill #0
  Filled 2022-05-29: qty 180, 90d supply, fill #0
  Filled 2022-09-13: qty 120, 60d supply, fill #0

## 2022-05-08 MED ORDER — PANTOPRAZOLE SODIUM 40 MG PO TBEC
DELAYED_RELEASE_TABLET | Freq: Every day | ORAL | 2 refills | Status: DC
  Filled 2022-05-08: qty 60, fill #0
  Filled 2022-05-08: qty 30, 30d supply, fill #0

## 2022-05-08 MED ORDER — LISINOPRIL 20 MG PO TABS
20.0000 mg | ORAL_TABLET | Freq: Every day | ORAL | 2 refills | Status: DC
  Filled 2022-05-08 (×2): qty 90, 90d supply, fill #0

## 2022-05-08 MED ORDER — HYDROXYZINE PAMOATE 50 MG PO CAPS
50.0000 mg | ORAL_CAPSULE | Freq: Three times a day (TID) | ORAL | 2 refills | Status: DC | PRN
  Filled 2022-05-08 (×2): qty 90, 30d supply, fill #0
  Filled 2022-06-11: qty 90, 30d supply, fill #1
  Filled 2022-07-09: qty 90, 30d supply, fill #2

## 2022-05-08 MED ORDER — LABETALOL HCL 300 MG PO TABS
300.0000 mg | ORAL_TABLET | Freq: Two times a day (BID) | ORAL | 2 refills | Status: DC
  Filled 2022-05-08 (×2): qty 180, 90d supply, fill #0

## 2022-05-08 MED ORDER — AMLODIPINE BESYLATE 10 MG PO TABS
ORAL_TABLET | Freq: Every day | ORAL | 1 refills | Status: DC
  Filled 2022-05-08 (×2): qty 90, 90d supply, fill #0

## 2022-05-08 MED ORDER — ATORVASTATIN CALCIUM 20 MG PO TABS
20.0000 mg | ORAL_TABLET | Freq: Every day | ORAL | 1 refills | Status: DC
  Filled 2022-05-08: qty 90, 90d supply, fill #0

## 2022-05-08 MED ORDER — HYDRALAZINE HCL 50 MG PO TABS
50.0000 mg | ORAL_TABLET | Freq: Two times a day (BID) | ORAL | 1 refills | Status: DC
  Filled 2022-05-08 – 2022-05-29 (×3): qty 180, 90d supply, fill #0

## 2022-05-08 MED ORDER — DEBROX 6.5 % OT SOLN
5.0000 [drp] | Freq: Two times a day (BID) | OTIC | 0 refills | Status: DC
  Filled 2022-05-08: qty 15, 30d supply, fill #0
  Filled 2022-05-08: qty 15, 15d supply, fill #0

## 2022-05-08 MED ORDER — BACLOFEN 20 MG PO TABS
20.0000 mg | ORAL_TABLET | Freq: Three times a day (TID) | ORAL | 2 refills | Status: DC
  Filled 2022-05-08 (×2): qty 90, 30d supply, fill #0
  Filled 2022-06-11: qty 90, 30d supply, fill #1
  Filled 2022-07-09: qty 90, 30d supply, fill #2

## 2022-05-08 MED ORDER — ASPIRIN 81 MG PO TBEC: 81.0000 mg | DELAYED_RELEASE_TABLET | Freq: Every day | ORAL | 12 refills | Status: AC

## 2022-05-08 MED ORDER — ONDANSETRON HCL 4 MG PO TABS
4.0000 mg | ORAL_TABLET | Freq: Three times a day (TID) | ORAL | 1 refills | Status: DC | PRN
  Filled 2022-05-08 (×2): qty 20, 7d supply, fill #0

## 2022-05-08 MED ORDER — TRAZODONE HCL 100 MG PO TABS
100.0000 mg | ORAL_TABLET | Freq: Every evening | ORAL | 2 refills | Status: DC | PRN
  Filled 2022-05-08: qty 60, 60d supply, fill #0
  Filled 2022-06-11: qty 90, 90d supply, fill #0

## 2022-05-08 NOTE — Assessment & Plan Note (Signed)
° ° °•   Current smoking consumption amount: 1/2 pack a day  . Dicsussion on advise to quit smoking and smoking impacts: Cardiovascular impacts  . Patient's willingness to quit: Not ready to quit  . Methods to quit smoking discussed: Nicotine replacement  . Medication management of smoking session drugs discussed: Nicotine replacement  . Resources provided:  AVS   . Setting quit date not established  . Follow-up arranged 4 mo   Time spent counseling the patient: 5 minutes  

## 2022-05-08 NOTE — Progress Notes (Deleted)
Established Patient Office Visit  Subjective:  Patient ID: Jason Romero, male    DOB: [birth year]  Age: 51 y.o. MRN: [id number]  CC: Primary care follow-up  HPI WELFORD GUZEK presents for primary care follow-up in person.  History of longstanding hypertension previous hemorrhagic stroke with abnormality of gait as late effect of his cerebrovascular accident with right upper and lower extremity spasticity and wheelchair-bound.  Overall patient is doing well except he would like an increased dose of the baclofen.  Blood pressure on arrival 129/87.  He has significant dental disease and needs referral for same.  He needs to understand how to navigate the Medicaid transportation system and his personal care assistant no longer comes to see him he needs a new personal care assistant.  Patient still smoking about 1/2 pack a day  He patient does agree to a Prevnar 20 pneumonia vaccine at this visit was given  6/27    Cardiovascular and Mediastinum   Essential hypertension - Primary    Blood pressure currently stable no change in medications refills given      Relevant Medications   amLODipine (NORVASC) 10 MG tablet   atorvastatin (LIPITOR) 20 MG tablet   hydrALAZINE (APRESOLINE) 50 MG tablet   labetalol (NORMODYNE) 300 MG tablet   lisinopril (ZESTRIL) 40 MG tablet   spironolactone (ALDACTONE) 25 MG tablet   Other Relevant Orders   Comprehensive metabolic panel   CBC with Differential/Platelet     Respiratory   COPD (chronic obstructive pulmonary disease) (HCC)    Continue inhaled medications        Digestive   Chronic hepatitis C without hepatic coma (HCC)    Currently stable      Dental abscess    Dental referral given        Nervous and Auditory   Spastic hemiplegia affecting nondominant side (HCC)    Increase dose of baclofen to 20 mg 3 times daily        Other   Tobacco abuse         ED 03/15/22 KAEDON GLADYSZ is a 51 y.o. male here presenting with  hypotension.  Patient was hypotensive with blood pressure in the 80s at home.  Patient appears dehydrated.  Consider UTI versus pneumonia.  Patient has stage I decub ulcer but no obvious infected ulcer or osteomyelitis.  Patient has chronic right-sided weakness.  Plan to get CBC and CMP and UA and chest x-ray and will get a CT head.  Will hydrate patient and reassess.   5:43 PM Patient has normal white blood cell count.  Patient's creatinine is 1.4 which is slightly increased compared to baseline.  Urinalysis showed no UTI.  Chest x-ray showed possible atelectasis versus pneumonia.  Given that he was hypotensive earlier, will treat with antibiotics.  Patient's blood pressure is 128/89 now and he remains afebrile.  Will discharge home with Keflex and Z-Pak.    Problems Addressed: AKI (acute kidney injury) (HCC): acute illness or injury Community acquired pneumonia of left lower lobe of lung: acute illness or injury Hypotension, unspecified hypotension type: acute illness or injury   Past Medical History:  Diagnosis Date   A-fib (HCC)    Chronic headaches    COPD (chronic obstructive pulmonary disease) (HCC)    GERD (gastroesophageal reflux disease)    History of blood transfusion    Hypertension    MI (myocardial infarction) (HCC)    Murmur 02/12/2021   Seizures (HCC)    Stroke (HCC)  Past Surgical History:  Procedure Laterality Date   CHOLECYSTECTOMY      Family History  Problem Relation Age of Onset   Alcoholism Other    Arthritis Other    Breast cancer Other    Heart disease Mother     Social History   Socioeconomic History   Marital status: Married    Spouse name: Not on file   Number of children: Not on file   Years of education: Not on file   Highest education level: Not on file  Occupational History   Not on file  Tobacco Use   Smoking status: Every Day    Packs/day: 1.00    Types: Cigarettes   Smokeless tobacco: Never  Vaping Use   Vaping Use: Never used   Substance and Sexual Activity   Alcohol use: Not Currently    Alcohol/week: 0.0 standard drinks of alcohol   Drug use: Yes    Types: Marijuana    Comment: OCC   Sexual activity: Not Currently  Other Topics Concern   Not on file  Social History Narrative   Left handed   Social Determinants of Health   Financial Resource Strain: Medium Risk (11/08/2021)   Overall Financial Resource Strain (CARDIA)    Difficulty of Paying Living Expenses: Somewhat hard  Food Insecurity: Not on file  Transportation Needs: Not on file  Physical Activity: Not on file  Stress: Not on file  Social Connections: Not on file  Intimate Partner Violence: Not on file    Outpatient Medications Prior to Visit  Medication Sig Dispense Refill   acetaminophen (TYLENOL) 325 MG tablet Take 2 tablets (650 mg total) by mouth every 4 (four) hours as needed for mild pain (or temp > 37.5 C (99.5 F)).     amLODipine (NORVASC) 10 MG tablet TAKE 1 TABLET (10 MG TOTAL) BY MOUTH DAILY. 90 tablet 1   amoxicillin-clavulanate (AUGMENTIN) 875-125 MG tablet Take 1 tablet by mouth 2 (two) times daily. 20 tablet 0   atorvastatin (LIPITOR) 20 MG tablet Take 1 tablet (20 mg total) by mouth daily. 90 tablet 1   azithromycin (ZITHROMAX Z-PAK) 250 MG tablet 2 po day one, then 1 daily x 4 days 6 tablet 0   baclofen (LIORESAL) 20 MG tablet Take 1 tablet (20 mg total) by mouth 3 (three) times daily. 90 tablet 2   Blood Pressure Monitoring (BLOOD PRESSURE KIT) DEVI Use to monitor blood pressure 1 each 0   cephALEXin (KEFLEX) 500 MG capsule Take 1 capsule (500 mg total) by mouth 3 (three) times daily. 21 capsule 0   hydrALAZINE (APRESOLINE) 50 MG tablet Take 1 tablet  by mouth 3 (three) times daily. 180 tablet 1   hydrOXYzine (VISTARIL) 50 MG capsule Take 1 capsule (50 mg total) by mouth every 8 (eight) hours as needed for anxiety. 90 capsule 2   labetalol (NORMODYNE) 300 MG tablet TAKE 1 TABLET (300 MG TOTAL) BY MOUTH EVERY 8 (EIGHT) HOURS.  180 tablet 2   lisinopril (ZESTRIL) 40 MG tablet Take 1 tablet (40 mg total) by mouth daily. 90 tablet 2   ondansetron (ZOFRAN) 4 MG tablet Take 1 tablet (4 mg total) by mouth every 8 (eight) hours as needed for nausea or vomiting. 20 tablet 0   pantoprazole (PROTONIX) 40 MG tablet TAKE 1 TABLET (40 MG TOTAL) BY MOUTH DAILY. 60 tablet 2   pregabalin (LYRICA) 150 MG capsule Take 1 capsule (150 mg total) by mouth 2 (two) times daily. 60 capsule 4  senna (SENOKOT) 8.6 MG TABS tablet Take 1 tablet (8.6 mg total) by mouth 2 (two) times daily. 120 tablet 0   spironolactone (ALDACTONE) 25 MG tablet Take 1 tablet by mouth daily. 90 tablet 1   traZODone (DESYREL) 100 MG tablet Take 1 tablet (100 mg total) by mouth at bedtime as needed for sleep. 60 tablet 2   No facility-administered medications prior to visit.    Allergies  Allergen Reactions   Hydrocodone Hives   Ibuprofen Other (See Comments)    Pt can't take this medication because it interacts with other medications that he is taking.     ROS Review of Systems  Constitutional:  Negative for chills, diaphoresis and fever.  HENT:  Negative for congestion, hearing loss, nosebleeds, sore throat and tinnitus.   Eyes:  Negative for photophobia and redness.  Respiratory:  Negative for cough, shortness of breath, wheezing and stridor.   Cardiovascular:  Negative for chest pain, palpitations and leg swelling.  Gastrointestinal:  Negative for abdominal pain, blood in stool, constipation, diarrhea, nausea and vomiting.  Endocrine: Negative for polydipsia.  Genitourinary:  Negative for dysuria, flank pain, frequency, hematuria and urgency.  Musculoskeletal:  Positive for gait problem. Negative for back pain, myalgias and neck pain.  Skin:  Negative for rash.  Allergic/Immunologic: Negative for environmental allergies.  Neurological:  Positive for weakness. Negative for dizziness, tremors, seizures and headaches.  Hematological:  Does not  bruise/bleed easily.  Psychiatric/Behavioral:  Negative for suicidal ideas. The patient is not nervous/anxious.       Objective:    Physical Exam Vitals reviewed.  Constitutional:      Appearance: Normal appearance. He is well-developed. He is not diaphoretic.  HENT:     Head: Normocephalic and atraumatic.     Nose: No nasal deformity, septal deviation, mucosal edema or rhinorrhea.     Right Sinus: No maxillary sinus tenderness or frontal sinus tenderness.     Left Sinus: No maxillary sinus tenderness or frontal sinus tenderness.     Mouth/Throat:     Pharynx: No oropharyngeal exudate.     Comments: Poor dentition multiple dental caries Eyes:     General: No scleral icterus.    Conjunctiva/sclera: Conjunctivae normal.     Pupils: Pupils are equal, round, and reactive to light.  Neck:     Thyroid: No thyromegaly.     Vascular: No carotid bruit or JVD.     Trachea: Trachea normal. No tracheal tenderness or tracheal deviation.  Cardiovascular:     Rate and Rhythm: Normal rate and regular rhythm.     Chest Wall: PMI is not displaced.     Pulses: Normal pulses. No decreased pulses.     Heart sounds: Normal heart sounds, S1 normal and S2 normal. Heart sounds not distant. No murmur heard.    No systolic murmur is present.     No diastolic murmur is present.     No friction rub. No gallop. No S3 or S4 sounds.  Pulmonary:     Effort: No tachypnea, accessory muscle usage or respiratory distress.     Breath sounds: No stridor. No decreased breath sounds, wheezing, rhonchi or rales.  Chest:     Chest wall: No tenderness.  Abdominal:     General: Bowel sounds are normal. There is no distension.     Palpations: Abdomen is soft. Abdomen is not rigid.     Tenderness: There is no abdominal tenderness. There is no guarding or rebound.  Musculoskeletal:  General: Normal range of motion.     Cervical back: Normal range of motion and neck supple. No edema, erythema or rigidity. No  muscular tenderness. Normal range of motion.  Lymphadenopathy:     Head:     Right side of head: No submental or submandibular adenopathy.     Left side of head: No submental or submandibular adenopathy.     Cervical: No cervical adenopathy.  Skin:    General: Skin is warm and dry.     Coloration: Skin is not pale.     Findings: No rash.     Nails: There is no clubbing.  Neurological:     Mental Status: He is alert and oriented to person, place, and time. Mental status is at baseline.     Sensory: No sensory deficit.     Motor: Weakness present.     Gait: Gait abnormal.  Psychiatric:        Mood and Affect: Mood normal.        Speech: Speech normal.        Behavior: Behavior normal.        Thought Content: Thought content normal.        Judgment: Judgment normal.    There were no vitals taken for this visit. Wt Readings from Last 3 Encounters:  05/02/21 227 lb (103 kg)  10/19/20 227 lb 9.6 oz (103.2 kg)  09/20/20 240 lb 15.4 oz (109.3 kg)     Health Maintenance Due  Topic Date Due   TETANUS/TDAP  Never done   Zoster Vaccines- Shingrix (1 of 2) Never done   COVID-19 Vaccine (4 - Pfizer series) 04/26/2021    There are no preventive care reminders to display for this patient.  Lab Results  Component Value Date   TSH 0.64 03/22/2016   Lab Results  Component Value Date   WBC 9.3 03/15/2022   HGB 12.7 (L) 03/15/2022   HCT 38.1 (L) 03/15/2022   MCV 87.6 03/15/2022   PLT 240 03/15/2022   Lab Results  Component Value Date   NA 137 03/15/2022   K 3.9 03/15/2022   CO2 19 (L) 03/15/2022   GLUCOSE 105 (H) 03/15/2022   BUN 26 (H) 03/15/2022   CREATININE 1.43 (H) 03/15/2022   BILITOT 0.5 03/15/2022   ALKPHOS 75 03/15/2022   AST 12 (L) 03/15/2022   ALT 11 03/15/2022   PROT 7.1 03/15/2022   ALBUMIN 3.9 03/15/2022   CALCIUM 8.8 (L) 03/15/2022   ANIONGAP 11 03/15/2022   EGFR 101 01/08/2022   GFR 70.92 02/27/2017   Lab Results  Component Value Date   CHOL 96 (L)  01/08/2022   Lab Results  Component Value Date   HDL 35 (L) 01/08/2022   Lab Results  Component Value Date   LDLCALC 45 01/08/2022   Lab Results  Component Value Date   TRIG 75 01/08/2022   Lab Results  Component Value Date   CHOLHDL 2.7 01/08/2022   Lab Results  Component Value Date   HGBA1C 5.6 09/12/2020      Assessment & Plan:   Problem List Items Addressed This Visit   None  No orders of the defined types were placed in this encounter.   Follow-up: No follow-ups on file.    Shan Levans, MD

## 2022-05-08 NOTE — Assessment & Plan Note (Signed)
Continue inhaled medications 

## 2022-05-09 ENCOUNTER — Other Ambulatory Visit (HOSPITAL_COMMUNITY): Payer: Self-pay

## 2022-05-09 DIAGNOSIS — R8290 Unspecified abnormal findings in urine: Secondary | ICD-10-CM | POA: Diagnosis not present

## 2022-05-09 DIAGNOSIS — R32 Unspecified urinary incontinence: Secondary | ICD-10-CM | POA: Diagnosis not present

## 2022-05-09 DIAGNOSIS — I169 Hypertensive crisis, unspecified: Secondary | ICD-10-CM | POA: Diagnosis not present

## 2022-05-09 LAB — CBC WITH DIFFERENTIAL/PLATELET
Basophils Absolute: 0.1 10*3/uL (ref 0.0–0.2)
Basos: 1 %
EOS (ABSOLUTE): 0.4 10*3/uL (ref 0.0–0.4)
Eos: 4 %
Hematocrit: 33.6 % — ABNORMAL LOW (ref 37.5–51.0)
Hemoglobin: 11.2 g/dL — ABNORMAL LOW (ref 13.0–17.7)
Immature Grans (Abs): 0 10*3/uL (ref 0.0–0.1)
Immature Granulocytes: 0 %
Lymphocytes Absolute: 2.3 10*3/uL (ref 0.7–3.1)
Lymphs: 25 %
MCH: 28.6 pg (ref 26.6–33.0)
MCHC: 33.3 g/dL (ref 31.5–35.7)
MCV: 86 fL (ref 79–97)
Monocytes Absolute: 0.8 10*3/uL (ref 0.1–0.9)
Monocytes: 9 %
Neutrophils Absolute: 5.6 10*3/uL (ref 1.4–7.0)
Neutrophils: 61 %
Platelets: 316 10*3/uL (ref 150–450)
RBC: 3.92 x10E6/uL — ABNORMAL LOW (ref 4.14–5.80)
RDW: 13.1 % (ref 11.6–15.4)
WBC: 9.1 10*3/uL (ref 3.4–10.8)

## 2022-05-09 LAB — COMPREHENSIVE METABOLIC PANEL
ALT: 12 IU/L (ref 0–44)
AST: 12 IU/L (ref 0–40)
Albumin/Globulin Ratio: 1.7 (ref 1.2–2.2)
Albumin: 4.5 g/dL (ref 3.8–4.9)
Alkaline Phosphatase: 97 IU/L (ref 44–121)
BUN/Creatinine Ratio: 25 — ABNORMAL HIGH (ref 9–20)
BUN: 49 mg/dL — ABNORMAL HIGH (ref 6–24)
Bilirubin Total: <0.2 mg/dL (ref 0.0–1.2)
CO2: 21 mmol/L (ref 20–29)
Calcium: 9.6 mg/dL (ref 8.7–10.2)
Chloride: 103 mmol/L (ref 96–106)
Creatinine, Ser: 1.94 mg/dL — ABNORMAL HIGH (ref 0.76–1.27)
Globulin, Total: 2.6 g/dL (ref 1.5–4.5)
Glucose: 69 mg/dL — ABNORMAL LOW (ref 70–99)
Potassium: 4.5 mmol/L (ref 3.5–5.2)
Sodium: 138 mmol/L (ref 134–144)
Total Protein: 7.1 g/dL (ref 6.0–8.5)
eGFR: 41 mL/min/{1.73_m2} — ABNORMAL LOW (ref >59)

## 2022-05-09 NOTE — Progress Notes (Signed)
Let pt know labs stable kidney liver blood counts stable   follow medication changes as Rx at [facility]

## 2022-05-10 ENCOUNTER — Telehealth: Payer: Self-pay

## 2022-05-10 DIAGNOSIS — I169 Hypertensive crisis, unspecified: Secondary | ICD-10-CM | POA: Diagnosis not present

## 2022-05-10 NOTE — Telephone Encounter (Signed)
-----   Message from Storm Frisk, MD sent at 05/09/2022  5:46 AM EDT ----- Let pt know labs stable kidney liver blood counts stable   follow medication changes as Rx at [facility]

## 2022-05-10 NOTE — Telephone Encounter (Signed)
Pt was called and vm was left, Information has been sent to nurse pool.   

## 2022-05-11 ENCOUNTER — Other Ambulatory Visit (HOSPITAL_COMMUNITY): Payer: Self-pay

## 2022-05-11 DIAGNOSIS — I169 Hypertensive crisis, unspecified: Secondary | ICD-10-CM | POA: Diagnosis not present

## 2022-05-14 DIAGNOSIS — I169 Hypertensive crisis, unspecified: Secondary | ICD-10-CM | POA: Diagnosis not present

## 2022-05-15 DIAGNOSIS — I169 Hypertensive crisis, unspecified: Secondary | ICD-10-CM | POA: Diagnosis not present

## 2022-05-16 DIAGNOSIS — I169 Hypertensive crisis, unspecified: Secondary | ICD-10-CM | POA: Diagnosis not present

## 2022-05-17 DIAGNOSIS — I169 Hypertensive crisis, unspecified: Secondary | ICD-10-CM | POA: Diagnosis not present

## 2022-05-18 DIAGNOSIS — I169 Hypertensive crisis, unspecified: Secondary | ICD-10-CM | POA: Diagnosis not present

## 2022-05-21 DIAGNOSIS — I169 Hypertensive crisis, unspecified: Secondary | ICD-10-CM | POA: Diagnosis not present

## 2022-05-22 DIAGNOSIS — I169 Hypertensive crisis, unspecified: Secondary | ICD-10-CM | POA: Diagnosis not present

## 2022-05-23 DIAGNOSIS — I169 Hypertensive crisis, unspecified: Secondary | ICD-10-CM | POA: Diagnosis not present

## 2022-05-24 DIAGNOSIS — I169 Hypertensive crisis, unspecified: Secondary | ICD-10-CM | POA: Diagnosis not present

## 2022-05-25 DIAGNOSIS — I169 Hypertensive crisis, unspecified: Secondary | ICD-10-CM | POA: Diagnosis not present

## 2022-05-28 DIAGNOSIS — I169 Hypertensive crisis, unspecified: Secondary | ICD-10-CM | POA: Diagnosis not present

## 2022-05-29 ENCOUNTER — Other Ambulatory Visit: Payer: Self-pay

## 2022-05-29 ENCOUNTER — Other Ambulatory Visit (HOSPITAL_COMMUNITY): Payer: Self-pay

## 2022-05-29 DIAGNOSIS — I169 Hypertensive crisis, unspecified: Secondary | ICD-10-CM | POA: Diagnosis not present

## 2022-05-30 DIAGNOSIS — I169 Hypertensive crisis, unspecified: Secondary | ICD-10-CM | POA: Diagnosis not present

## 2022-05-31 ENCOUNTER — Other Ambulatory Visit (HOSPITAL_COMMUNITY): Payer: Self-pay

## 2022-05-31 DIAGNOSIS — I169 Hypertensive crisis, unspecified: Secondary | ICD-10-CM | POA: Diagnosis not present

## 2022-06-01 DIAGNOSIS — R32 Unspecified urinary incontinence: Secondary | ICD-10-CM | POA: Diagnosis not present

## 2022-06-01 DIAGNOSIS — R8290 Unspecified abnormal findings in urine: Secondary | ICD-10-CM | POA: Diagnosis not present

## 2022-06-04 ENCOUNTER — Ambulatory Visit: Payer: Self-pay

## 2022-06-04 DIAGNOSIS — I169 Hypertensive crisis, unspecified: Secondary | ICD-10-CM | POA: Diagnosis not present

## 2022-06-04 NOTE — Telephone Encounter (Signed)
Pt called back and scheduled appt

## 2022-06-04 NOTE — Telephone Encounter (Signed)
Wife told to hold lisinopril, verbalizes understanding. Will wait for call about when to come in for BP check.

## 2022-06-04 NOTE — Telephone Encounter (Signed)
Routing to pcp for review 

## 2022-06-04 NOTE — Telephone Encounter (Signed)
      Chief Complaint: Low BP - Yesterday 90/70, today 107/72  pulse 92. Took his BP medication this morning. Symptoms: Yesterday felt tired, weak Frequency: Yesterday Pertinent Negatives: Patient denies chest pain Disposition: [] ED /[] Urgent Care (no appt availability in office) / [] Appointment(In office/virtual)/ []  [facility]/ [] Home Care/ [] Refused Recommended Disposition /[] [facility]/ [x]  Follow-up with PCP Additional Notes: Instructed if BP goes below 100 sustained, call 911. Please advise pt.  Answer Assessment - Initial Assessment Questions 1. BLOOD PRESSURE: "What is the blood pressure?" "Did you take at least two measurements 5 minutes apart?"     Yesterday 90/70   Today  107/72 2. ONSET: "When did you take your blood pressure?"     Yesterday 3. HOW: "How did you obtain the blood pressure?" (e.g., visiting nurse, automatic home BP monitor)     Home cuff 4. HISTORY: "Do you have a history of low blood pressure?" "What is your blood pressure normally?"     No 5. MEDICINES: "Are you taking any medications for blood pressure?" If Yes, ask: "Have they been changed recently?"     Yes, some medication were stopped 6. PULSE RATE: "Do you know what your pulse rate is?"      92 7. OTHER SYMPTOMS: "Have you been sick recently?" "Have you had a recent injury?"     No 8. PREGNANCY: "Is there any chance you are pregnant?" "When was your last menstrual period?"     N/a  Protocols used: Blood Pressure - Low-A-AH

## 2022-06-04 NOTE — Telephone Encounter (Signed)
Noted  

## 2022-06-04 NOTE — Telephone Encounter (Signed)
HOLD LISINOPRIL   ADD patient on this wed or Thursday for BP check

## 2022-06-05 DIAGNOSIS — I169 Hypertensive crisis, unspecified: Secondary | ICD-10-CM | POA: Diagnosis not present

## 2022-06-05 NOTE — Progress Notes (Unsigned)
Established Patient Office Visit  Subjective   Patient ID: Jason Romero, male    DOB: 03-12-1971  Age: 51 y.o. MRN: [id number]  No chief complaint on file.   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 Accompanied by his wife.  Patients blood pressure today 99/65. Patient recently seen in May in the emergency for hypotension and pneumonia. He was given fluids and discharged home.Infection cleared. Patient states blood pressure at home has also been running low with similar readings. Patient is doing his best with eating and drinking but due to his reflux disease and stroke he has trouble at times.  His Gastroesophageal Reflux Disease continues to intermittently bother him. He experiences Nausea and vomiting after eating on occasion. Chronic low back pain is still an issue for him. Pain is worse in the morning and improves slightly throughout the day. Denies blood in stool, painful radiation, loss of bowel, urinary incontinence.   Patient due for Dental examination and cleaning. Referral given at prior visit and patient has not followed up yet. Continues to smoke 1 pack per day, recreational marijuana use, denies alcohol use.      Patient Active Problem List   Diagnosis Date Noted   Bilateral impacted cerumen 05/08/2022   Dental abscess 09/27/2021   Chronic hepatitis C without hepatic coma (HCC) 02/28/2021   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) 02/14/2021   Hyperlipidemia 02/14/2021   Visual changes 02/14/2021   Obesity 02/12/2021   Back pain 02/12/2021   Depression 01/26/2021   Spastic hemiplegia affecting nondominant side (HCC)    Dysphagia due to recent stroke 09/20/2020   History of intracranial hemorrhage 09/20/2020   History of stroke with residual deficit  09/11/2020   Sleep apnea 09/06/2016   Tobacco abuse 09/06/2016   Unintentional weight loss 03/22/2016   Essential hypertension 02/12/2016   Anxiety 02/12/2016   Migraines 02/12/2016   COPD (chronic obstructive pulmonary disease) (HCC) 02/01/2016   Gastroesophageal reflux disease without esophagitis 02/01/2016   Past Medical History:  Diagnosis Date   A-fib (HCC)    Chronic headaches    COPD (chronic obstructive pulmonary disease) (HCC)    GERD (gastroesophageal reflux disease)    History of blood transfusion    Hypertension    MI (myocardial infarction) (HCC)    Murmur 02/12/2021   Seizures (HCC)    Stroke (HCC)    Past Surgical History:  Procedure Laterality Date   CHOLECYSTECTOMY     Social History   Tobacco Use   Smoking status: Every Day    Packs/day: 1.00    Types: Cigarettes   Smokeless tobacco: Never  Vaping Use   Vaping Use: Never used  Substance Use Topics   Alcohol use: Not Currently    Alcohol/week: 0.0 standard drinks of alcohol   Drug use: Yes    Types: Marijuana    Comment: OCC   Social History   Socioeconomic History   Marital status: Married    Spouse name: Not on file   Number of children: Not on file   Years of education: Not on file   Highest education level: Not on file  Occupational History   Not on file  Tobacco Use   Smoking status: Every Day    Packs/day: 1.00    Types: Cigarettes   Smokeless tobacco: Never  Vaping Use   Vaping Use: Never used  Substance and Sexual Activity   Alcohol use: Not Currently    Alcohol/week: 0.0 standard drinks of alcohol   Drug use: Yes    Types: Marijuana    Comment: OCC   Sexual activity: Not Currently  Other Topics Concern   Not on file  Social History Narrative   Left handed   Social Determinants of Health   Financial Resource Strain: Medium Risk (11/08/2021)   Overall Financial Resource Strain (CARDIA)    Difficulty of Paying Living Expenses: Somewhat hard  Food Insecurity: Not on  file  Transportation Needs: Not on file  Physical Activity: Not on file  Stress: Not on file  Social Connections: Not on file  Intimate Partner Violence: Not on file   Family Status  Relation Name Status   Other  (Not Specified)   Other  (Not Specified)   Other  (Not Specified)   Mother  (Not Specified)   Family History  Problem Relation Age of Onset   Alcoholism Other    Arthritis Other    Breast cancer Other    Heart disease Mother    Allergies  Allergen Reactions   Hydrocodone Hives   Ibuprofen Other (See Comments)    Pt can't take this medication because it interacts with other medications that he is taking.     Review of Systems  Constitutional: Negative.  Negative for chills, diaphoresis, fever, malaise/fatigue and weight loss.  HENT:  Positive for ear pain and hearing loss. Negative for congestion, ear discharge, nosebleeds, sore throat and tinnitus.   Eyes: Negative.  Negative for blurred vision, double vision, photophobia and discharge.  Respiratory: Negative.  Negative for cough, hemoptysis, sputum production, shortness of breath, wheezing and stridor.        No excess mucus  Cardiovascular: Negative.  Negative for chest pain, palpitations, orthopnea, claudication, leg swelling and PND.  Gastrointestinal:  Positive for heartburn and vomiting. Negative for abdominal pain, blood in stool, constipation, diarrhea, melena and nausea.  Genitourinary: Negative.  Negative for dysuria, flank pain, frequency, hematuria and urgency.  Musculoskeletal:  Positive for back pain. Negative for falls, joint pain, myalgias and neck pain.  Skin: Negative.  Negative for itching and rash.  Neurological: Negative.  Negative for dizziness, tingling, tremors, sensory change, speech change, focal weakness, seizures, loss of consciousness, weakness and headaches.  Endo/Heme/Allergies: Negative.  Negative for environmental allergies and polydipsia. Does not bruise/bleed easily.   Psychiatric/Behavioral: Negative.  Negative for depression, hallucinations, memory loss, substance abuse and suicidal ideas. The patient is not nervous/anxious and does not have insomnia.   All other systems reviewed and are negative.     Objective:     There were no vitals taken for this visit. BP Readings from Last 3 Encounters:  05/08/22 99/65  03/15/22 128/89  01/08/22 129/87   Wt Readings from Last 3 Encounters:  05/02/21 227 lb (103 kg)  10/19/20 227 lb 9.6 oz (103.2 kg)  09/20/20 240 lb 15.4 oz (109.3 kg)      Physical Exam Vitals reviewed.  Constitutional:      Appearance: Normal appearance. He is well-developed. He is not diaphoretic.  HENT:     Head: Normocephalic and atraumatic.     Right Ear: External ear normal. There is impacted cerumen.     Left Ear: External ear normal. There is impacted cerumen.     Nose: Nose normal. No nasal deformity, septal deviation, mucosal edema or rhinorrhea.  Right Sinus: No maxillary sinus tenderness or frontal sinus tenderness.     Left Sinus: No maxillary sinus tenderness or frontal sinus tenderness.     Mouth/Throat:     Mouth: Mucous membranes are moist.     Pharynx: Oropharynx is clear. No oropharyngeal exudate.     Comments: Poor dentition Eyes:     General: No scleral icterus.    Conjunctiva/sclera: Conjunctivae normal.     Pupils: Pupils are equal, round, and reactive to light.  Neck:     Thyroid: No thyromegaly.     Vascular: No carotid bruit or JVD.     Trachea: Trachea normal. No tracheal tenderness or tracheal deviation.  Cardiovascular:     Rate and Rhythm: Normal rate and regular rhythm.     Chest Wall: PMI is not displaced.     Pulses: Normal pulses. No decreased pulses.     Heart sounds: Normal heart sounds, S1 normal and S2 normal. Heart sounds not distant. No murmur heard.    No systolic murmur is present.     No diastolic murmur is present.     No friction rub. No gallop. No S3 or S4 sounds.   Pulmonary:     Effort: Pulmonary effort is normal. No tachypnea, accessory muscle usage or respiratory distress.     Breath sounds: Normal breath sounds. No stridor. No decreased breath sounds, wheezing, rhonchi or rales.  Chest:     Chest wall: No tenderness.  Abdominal:     General: Abdomen is flat. Bowel sounds are normal. There is no distension.     Palpations: Abdomen is soft. Abdomen is not rigid.     Tenderness: There is no abdominal tenderness. There is no guarding or rebound.  Musculoskeletal:        General: Normal range of motion.     Cervical back: Normal range of motion and neck supple. No edema, erythema or rigidity. No muscular tenderness. Normal range of motion.     Lumbar back: Tenderness present.  Lymphadenopathy:     Head:     Right side of head: No submental or submandibular adenopathy.     Left side of head: No submental or submandibular adenopathy.     Cervical: No cervical adenopathy.  Skin:    General: Skin is warm and dry.     Coloration: Skin is not pale.     Findings: No rash.     Nails: There is no clubbing.  Neurological:     Mental Status: He is alert and oriented to person, place, and time.     Sensory: No sensory deficit.     Motor: Weakness present.     Gait: Gait abnormal.     Comments: Upper and Lower Extremity Right sided parlysis  Psychiatric:        Speech: Speech normal.        Behavior: Behavior normal.      No results found for any visits on 06/06/22.  Last CBC Lab Results  Component Value Date   WBC 9.1 05/08/2022   HGB 11.2 (L) 05/08/2022   HCT 33.6 (L) 05/08/2022   MCV 86 05/08/2022   MCH 28.6 05/08/2022   RDW 13.1 05/08/2022   PLT 316 05/08/2022   Last metabolic panel Lab Results  Component Value Date   GLUCOSE 69 (L) 05/08/2022   NA 138 05/08/2022   K 4.5 05/08/2022   CL 103 05/08/2022   CO2 21 05/08/2022   BUN 49 (H) 05/08/2022   CREATININE  1.94 (H) 05/08/2022   GFRNONAA 59 (L) 03/15/2022   CALCIUM 9.6  05/08/2022   PHOS 4.2 09/19/2020   PROT 7.1 05/08/2022   ALBUMIN 4.5 05/08/2022   LABGLOB 2.6 05/08/2022   AGRATIO 1.7 05/08/2022   BILITOT <0.2 05/08/2022   ALKPHOS 97 05/08/2022   AST 12 05/08/2022   ALT 12 05/08/2022   ANIONGAP 11 03/15/2022   Last lipids Lab Results  Component Value Date   CHOL 96 (L) 01/08/2022   HDL 35 (L) 01/08/2022   LDLCALC 45 01/08/2022   TRIG 75 01/08/2022   CHOLHDL 2.7 01/08/2022   Last hemoglobin A1c Lab Results  Component Value Date   HGBA1C 5.6 09/12/2020   Last thyroid functions Lab Results  Component Value Date   TSH 0.64 03/22/2016   Last vitamin D No results found for: "25OHVITD2", "25OHVITD3", "VD25OH" Last vitamin B12 and Folate No results found for: "VITAMINB12", "FOLATE"  The ASCVD Risk score (Arnett DK, et al., 2019) failed to calculate for the following reasons:   The patient has a prior MI or stroke diagnosis    Assessment & Plan:   Problem List Items Addressed This Visit   None 38 minutes spent multiple systems assessed ears cleaned extra time spent with education high degree of complexity  No follow-ups on file.    Shan Levans, MD

## 2022-06-06 ENCOUNTER — Encounter: Payer: Self-pay | Admitting: Critical Care Medicine

## 2022-06-06 ENCOUNTER — Ambulatory Visit: Payer: Medicaid Other | Attending: Critical Care Medicine | Admitting: Critical Care Medicine

## 2022-06-06 VITALS — BP 110/74 | HR 80 | Temp 97.8°F | Resp 97 | Ht 74.0 in

## 2022-06-06 DIAGNOSIS — R269 Unspecified abnormalities of gait and mobility: Secondary | ICD-10-CM | POA: Diagnosis not present

## 2022-06-06 DIAGNOSIS — E782 Mixed hyperlipidemia: Secondary | ICD-10-CM | POA: Diagnosis not present

## 2022-06-06 DIAGNOSIS — I10 Essential (primary) hypertension: Secondary | ICD-10-CM | POA: Diagnosis not present

## 2022-06-06 DIAGNOSIS — Z720 Tobacco use: Secondary | ICD-10-CM

## 2022-06-06 DIAGNOSIS — K219 Gastro-esophageal reflux disease without esophagitis: Secondary | ICD-10-CM

## 2022-06-06 DIAGNOSIS — G8110 Spastic hemiplegia affecting unspecified side: Secondary | ICD-10-CM | POA: Diagnosis not present

## 2022-06-06 DIAGNOSIS — K047 Periapical abscess without sinus: Secondary | ICD-10-CM

## 2022-06-06 DIAGNOSIS — I6930 Unspecified sequelae of cerebral infarction: Secondary | ICD-10-CM | POA: Diagnosis not present

## 2022-06-06 DIAGNOSIS — I69398 Other sequelae of cerebral infarction: Secondary | ICD-10-CM

## 2022-06-06 DIAGNOSIS — I169 Hypertensive crisis, unspecified: Secondary | ICD-10-CM | POA: Diagnosis not present

## 2022-06-06 NOTE — Patient Instructions (Signed)
Discontinue lisinopril  No further medication changes  Labs today kidney function  Return Dr [name] 4 weeks,   will change early August appt to later in month of August  Call if Blood pressure is less than 90/60

## 2022-06-06 NOTE — Assessment & Plan Note (Signed)
This has resolved.

## 2022-06-06 NOTE — Assessment & Plan Note (Signed)
Continue lipid therapy 

## 2022-06-06 NOTE — Assessment & Plan Note (Signed)
  .   Current smoking consumption amount: 1/2 pack a day  . Dicsussion on advise to quit smoking and smoking impacts: Cardiovascular impacts  . Patient's willingness to quit: Not ready to quit  . Methods to quit smoking discussed: Nicotine replacement  . Medication management of smoking session drugs discussed: Nicotine replacement  . Resources provided:  AVS   . Setting quit date not established  . Follow-up arranged 4 mo   Time spent counseling the patient: 5 minutes

## 2022-06-06 NOTE — Progress Notes (Signed)
Wants to dicuss low BP.

## 2022-06-06 NOTE — Assessment & Plan Note (Signed)
Patient needs a new wheelchair current 1 has multiple issues

## 2022-06-06 NOTE — Assessment & Plan Note (Signed)
Blood pressure is running low at this time plan for this patient is to discontinue further lisinopril continue other blood pressure medications as prescribed  We will check renal panel this visit

## 2022-06-06 NOTE — Assessment & Plan Note (Signed)
Continue proton pump inhibitor

## 2022-06-07 DIAGNOSIS — I169 Hypertensive crisis, unspecified: Secondary | ICD-10-CM | POA: Diagnosis not present

## 2022-06-07 LAB — BMP8+EGFR
BUN/Creatinine Ratio: 16 (ref 9–20)
BUN: 14 mg/dL (ref 6–24)
CO2: 21 mmol/L (ref 20–29)
Calcium: 8.5 mg/dL — ABNORMAL LOW (ref 8.7–10.2)
Chloride: 104 mmol/L (ref 96–106)
Creatinine, Ser: 0.9 mg/dL (ref 0.76–1.27)
Glucose: 86 mg/dL (ref 70–99)
Potassium: 4.1 mmol/L (ref 3.5–5.2)
Sodium: 140 mmol/L (ref 134–144)
eGFR: 103 mL/min/{1.73_m2} (ref >59)

## 2022-06-07 NOTE — Progress Notes (Signed)
Let pt know kidneys are better than last checked continue OFF lisinopril

## 2022-06-08 ENCOUNTER — Telehealth: Payer: Self-pay

## 2022-06-08 DIAGNOSIS — I169 Hypertensive crisis, unspecified: Secondary | ICD-10-CM | POA: Diagnosis not present

## 2022-06-08 NOTE — Telephone Encounter (Signed)
Pt was called and is aware of results, DOB was confirmed.  ?

## 2022-06-08 NOTE — Telephone Encounter (Signed)
-----   Message from Storm Frisk, MD sent at 06/07/2022  6:25 AM EDT ----- Let pt know kidneys are better than last checked continue OFF lisinopril

## 2022-06-11 ENCOUNTER — Other Ambulatory Visit (HOSPITAL_COMMUNITY): Payer: Self-pay

## 2022-06-11 DIAGNOSIS — I169 Hypertensive crisis, unspecified: Secondary | ICD-10-CM | POA: Diagnosis not present

## 2022-06-12 ENCOUNTER — Other Ambulatory Visit (HOSPITAL_COMMUNITY): Payer: Self-pay

## 2022-06-12 DIAGNOSIS — I169 Hypertensive crisis, unspecified: Secondary | ICD-10-CM | POA: Diagnosis not present

## 2022-06-13 ENCOUNTER — Telehealth: Payer: Self-pay

## 2022-06-13 DIAGNOSIS — I169 Hypertensive crisis, unspecified: Secondary | ICD-10-CM | POA: Diagnosis not present

## 2022-06-13 NOTE — Telephone Encounter (Signed)
Order received for manual wheelchair. I called and spoke to patient's wife, [name], to inquire if they have a preference for DME companies and  they have no preference. Order then faxed to [employer] after approval received from [facility]

## 2022-06-14 DIAGNOSIS — I169 Hypertensive crisis, unspecified: Secondary | ICD-10-CM | POA: Diagnosis not present

## 2022-06-15 DIAGNOSIS — I169 Hypertensive crisis, unspecified: Secondary | ICD-10-CM | POA: Diagnosis not present

## 2022-06-18 DIAGNOSIS — I169 Hypertensive crisis, unspecified: Secondary | ICD-10-CM | POA: Diagnosis not present

## 2022-06-19 ENCOUNTER — Ambulatory Visit: Payer: Medicaid Other | Admitting: Critical Care Medicine

## 2022-06-19 DIAGNOSIS — I169 Hypertensive crisis, unspecified: Secondary | ICD-10-CM | POA: Diagnosis not present

## 2022-06-20 DIAGNOSIS — I169 Hypertensive crisis, unspecified: Secondary | ICD-10-CM | POA: Diagnosis not present

## 2022-06-21 DIAGNOSIS — I169 Hypertensive crisis, unspecified: Secondary | ICD-10-CM | POA: Diagnosis not present

## 2022-06-22 DIAGNOSIS — I169 Hypertensive crisis, unspecified: Secondary | ICD-10-CM | POA: Diagnosis not present

## 2022-06-25 DIAGNOSIS — I169 Hypertensive crisis, unspecified: Secondary | ICD-10-CM | POA: Diagnosis not present

## 2022-06-25 DIAGNOSIS — R8290 Unspecified abnormal findings in urine: Secondary | ICD-10-CM | POA: Diagnosis not present

## 2022-06-25 DIAGNOSIS — R32 Unspecified urinary incontinence: Secondary | ICD-10-CM | POA: Diagnosis not present

## 2022-06-26 DIAGNOSIS — I169 Hypertensive crisis, unspecified: Secondary | ICD-10-CM | POA: Diagnosis not present

## 2022-06-27 DIAGNOSIS — I169 Hypertensive crisis, unspecified: Secondary | ICD-10-CM | POA: Diagnosis not present

## 2022-07-03 DIAGNOSIS — I169 Hypertensive crisis, unspecified: Secondary | ICD-10-CM | POA: Diagnosis not present

## 2022-07-05 DIAGNOSIS — I169 Hypertensive crisis, unspecified: Secondary | ICD-10-CM | POA: Diagnosis not present

## 2022-07-09 ENCOUNTER — Other Ambulatory Visit (HOSPITAL_COMMUNITY): Payer: Self-pay

## 2022-07-10 DIAGNOSIS — I169 Hypertensive crisis, unspecified: Secondary | ICD-10-CM | POA: Diagnosis not present

## 2022-07-11 ENCOUNTER — Other Ambulatory Visit (HOSPITAL_COMMUNITY): Payer: Self-pay

## 2022-07-11 ENCOUNTER — Encounter: Payer: Self-pay | Admitting: Critical Care Medicine

## 2022-07-11 ENCOUNTER — Ambulatory Visit: Payer: Medicaid Other | Attending: Critical Care Medicine | Admitting: Critical Care Medicine

## 2022-07-11 VITALS — BP 116/82 | HR 69 | Temp 97.8°F | Resp 16

## 2022-07-11 DIAGNOSIS — I10 Essential (primary) hypertension: Secondary | ICD-10-CM | POA: Diagnosis not present

## 2022-07-11 DIAGNOSIS — R269 Unspecified abnormalities of gait and mobility: Secondary | ICD-10-CM

## 2022-07-11 DIAGNOSIS — I619 Nontraumatic intracerebral hemorrhage, unspecified: Secondary | ICD-10-CM

## 2022-07-11 DIAGNOSIS — Z23 Encounter for immunization: Secondary | ICD-10-CM | POA: Diagnosis not present

## 2022-07-11 DIAGNOSIS — Z720 Tobacco use: Secondary | ICD-10-CM

## 2022-07-11 DIAGNOSIS — I69398 Other sequelae of cerebral infarction: Secondary | ICD-10-CM | POA: Diagnosis not present

## 2022-07-11 DIAGNOSIS — J432 Centrilobular emphysema: Secondary | ICD-10-CM | POA: Diagnosis not present

## 2022-07-11 MED ORDER — TRAZODONE HCL 100 MG PO TABS
100.0000 mg | ORAL_TABLET | Freq: Every evening | ORAL | 2 refills | Status: DC | PRN
  Filled 2022-07-11 – 2022-09-13 (×2): qty 60, 60d supply, fill #0
  Filled 2022-10-29: qty 60, 60d supply, fill #1

## 2022-07-11 MED ORDER — HYDRALAZINE HCL 50 MG PO TABS
50.0000 mg | ORAL_TABLET | Freq: Two times a day (BID) | ORAL | 1 refills | Status: DC
  Filled 2022-07-11 – 2022-08-28 (×2): qty 180, 90d supply, fill #0

## 2022-07-11 MED ORDER — BACLOFEN 20 MG PO TABS
20.0000 mg | ORAL_TABLET | Freq: Three times a day (TID) | ORAL | 2 refills | Status: DC
  Filled 2022-08-14: qty 90, 30d supply, fill #0
  Filled 2022-09-13: qty 90, 30d supply, fill #1
  Filled 2022-10-15: qty 90, 30d supply, fill #2

## 2022-07-11 MED ORDER — HYDROXYZINE PAMOATE 50 MG PO CAPS
50.0000 mg | ORAL_CAPSULE | Freq: Three times a day (TID) | ORAL | 2 refills | Status: DC | PRN
  Filled 2022-08-14: qty 90, 30d supply, fill #0
  Filled 2022-09-13: qty 90, 30d supply, fill #1
  Filled 2022-10-15: qty 90, 30d supply, fill #2

## 2022-07-11 MED ORDER — LABETALOL HCL 300 MG PO TABS
300.0000 mg | ORAL_TABLET | Freq: Two times a day (BID) | ORAL | 2 refills | Status: DC
Start: 2022-07-11 — End: 2022-11-13
  Filled 2022-07-11: qty 180, 90d supply, fill #0
  Filled 2022-08-14 – 2022-08-15 (×2): qty 60, 30d supply, fill #0
  Filled 2022-09-13: qty 40, 20d supply, fill #1
  Filled 2022-09-13: qty 20, 10d supply, fill #1
  Filled 2022-10-15: qty 60, 30d supply, fill #2

## 2022-07-11 MED ORDER — ATORVASTATIN CALCIUM 20 MG PO TABS
20.0000 mg | ORAL_TABLET | Freq: Every day | ORAL | 1 refills | Status: DC
Start: 2022-07-11 — End: 2022-11-13
  Filled 2022-07-11: qty 90, 90d supply, fill #0
  Filled 2022-10-15: qty 90, 90d supply, fill #1

## 2022-07-11 MED ORDER — PANTOPRAZOLE SODIUM 40 MG PO TBEC
DELAYED_RELEASE_TABLET | Freq: Every day | ORAL | 2 refills | Status: DC
  Filled 2022-07-11: qty 30, 30d supply, fill #0

## 2022-07-11 MED ORDER — AMLODIPINE BESYLATE 10 MG PO TABS
ORAL_TABLET | Freq: Every day | ORAL | 1 refills | Status: DC
  Filled 2022-07-11: qty 90, fill #0
  Filled 2022-08-28: qty 90, 90d supply, fill #0

## 2022-07-11 NOTE — Assessment & Plan Note (Signed)
We will need to continue with current wheelchair for now

## 2022-07-11 NOTE — Patient Instructions (Signed)
No change in medications  Refills placed  Try diabetic non cuffed socks for swelling in leg  Return Dr [name] 4 months  Flu vaccine was given

## 2022-07-11 NOTE — Assessment & Plan Note (Signed)
Asked to reduce amount of smoking use continue with inhalers

## 2022-07-11 NOTE — Assessment & Plan Note (Signed)
  .   Current smoking consumption amount: 10 cigarettes daily  Dicsussion on advise to quit smoking and smoking impacts: Cardiovascular impacts  Patient's willingness to quit: Not ready to quit  Methods to quit smoking discussed: Nicotine replacement  Medication management of smoking session drugs discussed: Nicotine replacement  Resources provided:  AVS   Setting quit date not established  Follow-up arranged 4 mo   Time spent counseling the patient: 5 minutes  

## 2022-07-11 NOTE — Assessment & Plan Note (Signed)
Blood pressure is improved at this time will not make further changes refill sent to pharmacy  Edema in lower extremity will not be treated at this time asked the patient to get diabetic socks that do not have cuffs

## 2022-07-11 NOTE — Progress Notes (Signed)
Established Patient Office Visit  Subjective   Patient ID: Jason Romero, male    DOB: Apr 05, 1971  Age: 51 y.o. 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 Accompanied by his wife.  7/5 Patients blood pressure today 99/65. Patient recently seen in May in the emergency for hypotension and pneumonia. He was given fluids and discharged home.Infection cleared. Patient states blood pressure at home has also been running low with similar readings. Patient is doing his best with eating and drinking but due to his reflux disease and stroke he has trouble at times.  His Gastroesophageal Reflux Disease continues to intermittently bother him. He experiences Nausea and vomiting after eating on occasion. Chronic low back pain is still an issue for him. Pain is worse in the morning and improves slightly throughout the day. Denies blood in stool, painful radiation, loss of bowel, urinary incontinence.   Patient due for Dental examination and cleaning. Referral given at prior visit and patient has not followed up yet. Continues to smoke 1 pack per day, recreational marijuana use, denies alcohol use.  7/26 Patient seen in return short-term follow-up.  This patient has had low blood pressure ranging in the 90/70 range in the home environment.  He comes in today for a blood pressure check.  We have had him hold his lisinopril since the last visit.  Patient denies any nausea vomiting chest pain or other symptom complex these  07/11/2022 Patient returns in follow-up on arrival blood pressure is much improved 116/82.  He has tolerated the adjustments in blood pressure medications.  He is still smoking however 10 cigarettes daily.  Does complain of some mild  edema left lower extremity.  There are no other complaints.  He is accompanied with his wife who helps him with his care.  He was informed he cannot have a new wheelchair for another year based on Medicaid guidelines.  The current wheelchair is functional.  Patient does agree to and did receive a flu vaccine this visit.      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) 02/14/2021   Hyperlipidemia 02/14/2021   Visual changes 02/14/2021   Obesity 02/12/2021   Back pain 02/12/2021   Depression 01/26/2021   Spastic hemiplegia affecting nondominant side (HCC)    Dysphagia due to recent stroke 09/20/2020   History of intracranial hemorrhage 09/20/2020   History of stroke with residual deficit 09/11/2020   Sleep apnea 09/06/2016   Tobacco abuse 09/06/2016   Unintentional weight loss 03/22/2016   Essential hypertension 02/12/2016   Anxiety 02/12/2016   Migraines 02/12/2016   COPD (chronic obstructive pulmonary disease) (HCC) 02/01/2016   Gastroesophageal reflux disease without esophagitis 02/01/2016   Past Medical History:  Diagnosis Date   A-fib [facility])    Chronic headaches    COPD (chronic obstructive pulmonary disease) (HCC)    GERD (gastroesophageal reflux disease)    History of blood transfusion    Hypertension    MI (myocardial infarction) (HCC)    Murmur 02/12/2021   Seizures (HCC)    Stroke [facility])    Past Surgical History:  Procedure Laterality Date   CHOLECYSTECTOMY     Social History   Tobacco Use   Smoking status: Every Day    Packs/day: 1.00    Types: Cigarettes   Smokeless tobacco: Never  Vaping Use   Vaping Use: Never used  Substance Use Topics   Alcohol use: Not Currently    Alcohol/week: 0.0 standard drinks of alcohol   Drug use: Yes    Types: Marijuana    Comment: OCC    Social History   Socioeconomic History   Marital status: Married    Spouse name: Not on file   Number of children: Not on file   Years of education: Not on file   Highest education level: Not on file  Occupational History   Not on file  Tobacco Use   Smoking status: Every Day    Packs/day: 1.00    Types: Cigarettes   Smokeless tobacco: Never  Vaping Use   Vaping Use: Never used  Substance and Sexual Activity   Alcohol use: Not Currently    Alcohol/week: 0.0 standard drinks of alcohol   Drug use: Yes    Types: Marijuana    Comment: OCC   Sexual activity: Not Currently  Other Topics Concern   Not on file  Social History Narrative   Left handed   Social Determinants of Health   Financial Resource Strain: Medium Risk (11/08/2021)   Overall Financial Resource Strain (CARDIA)    Difficulty of Paying Living Expenses: Somewhat hard  Food Insecurity: Not on file  Transportation Needs: Not on file  Physical Activity: Not on file  Stress: Not on file  Social Connections: Not on file  Intimate Partner Violence: Not on file   Family Status  Relation Name Status   Other  (Not Specified)   Other  (Not Specified)   Other  (Not Specified)   Mother  (Not Specified)   Family History  Problem Relation Age of Onset   Alcoholism Other    Arthritis Other    Breast cancer Other    Heart disease Mother    Allergies  Allergen Reactions   Hydrocodone Hives   Ibuprofen Other (See Comments)    Pt can't take this medication because it interacts with other medications that he is taking.     Review of Systems  Constitutional: Negative.  Negative for chills, diaphoresis, fever, malaise/fatigue and weight loss.  HENT:  Positive for hearing loss. Negative for congestion, ear discharge, ear pain, nosebleeds, sore throat and tinnitus.   Eyes: Negative.  Negative for blurred vision, double vision, photophobia and discharge.  Respiratory: Negative.  Negative for cough, hemoptysis,  sputum production, shortness of breath, wheezing and stridor.        No excess mucus  Cardiovascular:  Positive for leg swelling. Negative for chest pain, palpitations, orthopnea, claudication and PND.  Gastrointestinal:  Negative for abdominal pain, blood in stool, constipation, diarrhea, heartburn, melena, nausea and vomiting.  Genitourinary: Negative.  Negative for dysuria, flank pain, frequency, hematuria and urgency.  Musculoskeletal:  Negative for back pain, falls, joint pain, myalgias and neck pain.  Skin: Negative.  Negative for itching and rash.  Neurological: Negative.  Negative for dizziness, tingling, tremors, sensory change, speech change, focal weakness, seizures, loss of consciousness, weakness and headaches.  Endo/Heme/Allergies: Negative.  Negative for environmental allergies and polydipsia. Does not bruise/bleed easily.  Psychiatric/Behavioral: Negative.  Negative for depression, hallucinations, memory loss, substance abuse and suicidal ideas. The patient is not nervous/anxious and does not have insomnia.  All other systems reviewed and are negative.     Objective:     BP 116/82 (BP Location: Left Arm, Patient Position: Sitting, Cuff Size: Large)   Pulse 69   Temp 97.8 F (36.6 C) (Oral)   Resp 16   SpO2 98%  BP Readings from Last 3 Encounters:  07/11/22 116/82  06/06/22 110/74  05/08/22 99/65   Wt Readings from Last 3 Encounters:  05/02/21 227 lb (103 kg)  10/19/20 227 lb 9.6 oz (103.2 kg)  09/20/20 240 lb 15.4 oz (109.3 kg)      Physical Exam Vitals reviewed.  Constitutional:      Appearance: Normal appearance. He is well-developed. He is not diaphoretic.  HENT:     Head: Normocephalic and atraumatic.     Right Ear: External ear normal. There is no impacted cerumen.     Left Ear: External ear normal. There is no impacted cerumen.     Nose: Nose normal. No nasal deformity, septal deviation, mucosal edema or rhinorrhea.     Right Sinus: No maxillary  sinus tenderness or frontal sinus tenderness.     Left Sinus: No maxillary sinus tenderness or frontal sinus tenderness.     Mouth/Throat:     Mouth: Mucous membranes are moist.     Pharynx: Oropharynx is clear. No oropharyngeal exudate.     Comments: Poor dentition Eyes:     General: No scleral icterus.    Conjunctiva/sclera: Conjunctivae normal.     Pupils: Pupils are equal, round, and reactive to light.  Neck:     Thyroid: No thyromegaly.     Vascular: No carotid bruit or JVD.     Trachea: Trachea normal. No tracheal tenderness or tracheal deviation.  Cardiovascular:     Rate and Rhythm: Normal rate and regular rhythm.     Chest Wall: PMI is not displaced.     Pulses: Normal pulses. No decreased pulses.     Heart sounds: Normal heart sounds, S1 normal and S2 normal. Heart sounds not distant. No murmur heard.    No systolic murmur is present.     No diastolic murmur is present.     No friction rub. No gallop. No S3 or S4 sounds.  Pulmonary:     Effort: Pulmonary effort is normal. No tachypnea, accessory muscle usage or respiratory distress.     Breath sounds: Normal breath sounds. No stridor. No decreased breath sounds, wheezing, rhonchi or rales.  Chest:     Chest wall: No tenderness.  Abdominal:     General: Abdomen is flat. Bowel sounds are normal. There is no distension.     Palpations: Abdomen is soft. Abdomen is not rigid.     Tenderness: There is no abdominal tenderness. There is no guarding or rebound.  Musculoskeletal:        General: Normal range of motion.     Cervical back: Normal range of motion and neck supple. No edema, erythema or rigidity. No muscular tenderness. Normal range of motion.     Lumbar back: Tenderness present.     Left lower leg: Edema present.     Comments: Very mild edema left lower extremity with cuffing at the site of the tight sock on his lower leg  Lymphadenopathy:     Head:     Right side of head: No submental or submandibular adenopathy.      Left side of head: No submental or submandibular adenopathy.     Cervical: No cervical adenopathy.  Skin:  General: Skin is warm and dry.     Coloration: Skin is not pale.     Findings: No rash.     Nails: There is no clubbing.  Neurological:     Mental Status: He is alert and oriented to person, place, and time.     Sensory: No sensory deficit.     Motor: Weakness present.     Gait: Gait abnormal.     Comments: Upper and Lower Extremity Right sided parlysis  Psychiatric:        Speech: Speech normal.        Behavior: Behavior normal.      No results found for any visits on 07/11/22.  Last CBC Lab Results  Component Value Date   WBC 9.1 05/08/2022   HGB 11.2 (L) 05/08/2022   HCT 33.6 (L) 05/08/2022   MCV 86 05/08/2022   MCH 28.6 05/08/2022   RDW 13.1 05/08/2022   PLT 316 [id number]   Last metabolic panel Lab Results  Component Value Date   GLUCOSE 86 06/06/2022   NA 140 06/06/2022   K 4.1 06/06/2022   CL 104 06/06/2022   CO2 21 06/06/2022   BUN 14 06/06/2022   CREATININE 0.90 06/06/2022   GFRNONAA 59 (L) 03/15/2022   CALCIUM 8.5 (L) 06/06/2022   PHOS 4.2 09/19/2020   PROT 7.1 05/08/2022   ALBUMIN 4.5 05/08/2022   LABGLOB 2.6 05/08/2022   AGRATIO 1.7 05/08/2022   BILITOT <0.2 05/08/2022   ALKPHOS 97 05/08/2022   AST 12 05/08/2022   ALT 12 05/08/2022   ANIONGAP 11 03/15/2022   Last lipids Lab Results  Component Value Date   CHOL 96 (L) 01/08/2022   HDL 35 (L) 01/08/2022   LDLCALC 45 01/08/2022   TRIG 75 01/08/2022   CHOLHDL 2.7 01/08/2022   Last hemoglobin A1c Lab Results  Component Value Date   HGBA1C 5.6 09/12/2020   Last thyroid functions Lab Results  Component Value Date   TSH 0.64 03/22/2016   Last vitamin D No results found for: "25OHVITD2", "25OHVITD3", "VD25OH" Last vitamin B12 and Folate No results found for: "VITAMINB12", "FOLATE"  The ASCVD Risk score (Arnett DK, et al., 2019) failed to calculate for the following  reasons:   The patient has a prior MI or stroke diagnosis    Assessment & Plan:   Problem List Items Addressed This Visit       Cardiovascular and Mediastinum   Essential hypertension    Blood pressure is improved at this time will not make further changes refill sent to pharmacy  Edema in lower extremity will not be treated at this time asked the patient to get diabetic socks that do not have cuffs      Relevant Medications   amLODipine (NORVASC) 10 MG tablet   atorvastatin (LIPITOR) 20 MG tablet   hydrALAZINE (APRESOLINE) 50 MG tablet   labetalol (NORMODYNE) 300 MG tablet     Respiratory   COPD (chronic obstructive pulmonary disease) ([place])    Asked to reduce amount of smoking use continue with inhalers        Other   Tobacco abuse       Current smoking consumption amount: 10 cigarettes daily  Dicsussion on advise to quit smoking and smoking impacts: Cardiovascular impacts  Patient's willingness to quit: Not ready to quit  Methods to quit smoking discussed: Nicotine replacement  Medication management of smoking session drugs discussed: Nicotine replacement  Resources provided:  AVS   Setting quit date not established  Follow-up arranged 4 mo   Time spent counseling the patient: 5 minutes       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    Shan Levans, MD

## 2022-07-11 NOTE — Progress Notes (Signed)
Follow up blood pressure  Noticed some swelling in LLE

## 2022-07-13 ENCOUNTER — Other Ambulatory Visit (HOSPITAL_COMMUNITY): Payer: Self-pay

## 2022-07-17 ENCOUNTER — Other Ambulatory Visit (HOSPITAL_COMMUNITY): Payer: Self-pay

## 2022-07-20 DIAGNOSIS — I169 Hypertensive crisis, unspecified: Secondary | ICD-10-CM | POA: Diagnosis not present

## 2022-07-23 DIAGNOSIS — I169 Hypertensive crisis, unspecified: Secondary | ICD-10-CM | POA: Diagnosis not present

## 2022-07-24 DIAGNOSIS — I169 Hypertensive crisis, unspecified: Secondary | ICD-10-CM | POA: Diagnosis not present

## 2022-07-25 DIAGNOSIS — I169 Hypertensive crisis, unspecified: Secondary | ICD-10-CM | POA: Diagnosis not present

## 2022-07-26 ENCOUNTER — Telehealth: Payer: Self-pay

## 2022-07-26 DIAGNOSIS — R32 Unspecified urinary incontinence: Secondary | ICD-10-CM | POA: Diagnosis not present

## 2022-07-26 DIAGNOSIS — R8290 Unspecified abnormal findings in urine: Secondary | ICD-10-CM | POA: Diagnosis not present

## 2022-07-26 NOTE — Telephone Encounter (Signed)
I spoke to [employer] who stated that the order for the wheelchair was voided because they spoke to the patient and he already has a wheelchair, so this chair would have been private pay and he did not want it.

## 2022-07-31 DIAGNOSIS — I169 Hypertensive crisis, unspecified: Secondary | ICD-10-CM | POA: Diagnosis not present

## 2022-08-01 DIAGNOSIS — I169 Hypertensive crisis, unspecified: Secondary | ICD-10-CM | POA: Diagnosis not present

## 2022-08-07 DIAGNOSIS — I169 Hypertensive crisis, unspecified: Secondary | ICD-10-CM | POA: Diagnosis not present

## 2022-08-08 DIAGNOSIS — I169 Hypertensive crisis, unspecified: Secondary | ICD-10-CM | POA: Diagnosis not present

## 2022-08-09 DIAGNOSIS — I169 Hypertensive crisis, unspecified: Secondary | ICD-10-CM | POA: Diagnosis not present

## 2022-08-14 ENCOUNTER — Other Ambulatory Visit (HOSPITAL_COMMUNITY): Payer: Self-pay

## 2022-08-14 DIAGNOSIS — I169 Hypertensive crisis, unspecified: Secondary | ICD-10-CM | POA: Diagnosis not present

## 2022-08-15 ENCOUNTER — Other Ambulatory Visit (HOSPITAL_COMMUNITY): Payer: Self-pay

## 2022-08-15 DIAGNOSIS — I169 Hypertensive crisis, unspecified: Secondary | ICD-10-CM | POA: Diagnosis not present

## 2022-08-16 ENCOUNTER — Other Ambulatory Visit (HOSPITAL_COMMUNITY): Payer: Self-pay

## 2022-08-17 ENCOUNTER — Other Ambulatory Visit (HOSPITAL_COMMUNITY): Payer: Self-pay

## 2022-08-24 DIAGNOSIS — R32 Unspecified urinary incontinence: Secondary | ICD-10-CM | POA: Diagnosis not present

## 2022-08-24 DIAGNOSIS — R8290 Unspecified abnormal findings in urine: Secondary | ICD-10-CM | POA: Diagnosis not present

## 2022-08-27 DIAGNOSIS — I169 Hypertensive crisis, unspecified: Secondary | ICD-10-CM | POA: Diagnosis not present

## 2022-08-28 ENCOUNTER — Other Ambulatory Visit (HOSPITAL_COMMUNITY): Payer: Self-pay

## 2022-08-28 DIAGNOSIS — I169 Hypertensive crisis, unspecified: Secondary | ICD-10-CM | POA: Diagnosis not present

## 2022-08-29 DIAGNOSIS — I169 Hypertensive crisis, unspecified: Secondary | ICD-10-CM | POA: Diagnosis not present

## 2022-08-30 ENCOUNTER — Other Ambulatory Visit (HOSPITAL_COMMUNITY): Payer: Self-pay

## 2022-08-30 DIAGNOSIS — I169 Hypertensive crisis, unspecified: Secondary | ICD-10-CM | POA: Diagnosis not present

## 2022-09-06 DIAGNOSIS — I169 Hypertensive crisis, unspecified: Secondary | ICD-10-CM | POA: Diagnosis not present

## 2022-09-10 DIAGNOSIS — I169 Hypertensive crisis, unspecified: Secondary | ICD-10-CM | POA: Diagnosis not present

## 2022-09-13 ENCOUNTER — Other Ambulatory Visit (HOSPITAL_COMMUNITY): Payer: Self-pay

## 2022-09-13 DIAGNOSIS — G8191 Hemiplegia, unspecified affecting right dominant side: Secondary | ICD-10-CM | POA: Diagnosis not present

## 2022-09-13 DIAGNOSIS — R8290 Unspecified abnormal findings in urine: Secondary | ICD-10-CM | POA: Diagnosis not present

## 2022-09-13 DIAGNOSIS — R32 Unspecified urinary incontinence: Secondary | ICD-10-CM | POA: Diagnosis not present

## 2022-09-14 ENCOUNTER — Other Ambulatory Visit (HOSPITAL_COMMUNITY): Payer: Self-pay

## 2022-09-17 ENCOUNTER — Other Ambulatory Visit (HOSPITAL_COMMUNITY): Payer: Self-pay

## 2022-09-17 DIAGNOSIS — I169 Hypertensive crisis, unspecified: Secondary | ICD-10-CM | POA: Diagnosis not present

## 2022-09-18 DIAGNOSIS — I169 Hypertensive crisis, unspecified: Secondary | ICD-10-CM | POA: Diagnosis not present

## 2022-09-19 DIAGNOSIS — I169 Hypertensive crisis, unspecified: Secondary | ICD-10-CM | POA: Diagnosis not present

## 2022-10-01 DIAGNOSIS — I169 Hypertensive crisis, unspecified: Secondary | ICD-10-CM | POA: Diagnosis not present

## 2022-10-02 DIAGNOSIS — I169 Hypertensive crisis, unspecified: Secondary | ICD-10-CM | POA: Diagnosis not present

## 2022-10-04 DIAGNOSIS — I169 Hypertensive crisis, unspecified: Secondary | ICD-10-CM | POA: Diagnosis not present

## 2022-10-11 DIAGNOSIS — I169 Hypertensive crisis, unspecified: Secondary | ICD-10-CM | POA: Diagnosis not present

## 2022-10-12 DIAGNOSIS — G8191 Hemiplegia, unspecified affecting right dominant side: Secondary | ICD-10-CM | POA: Diagnosis not present

## 2022-10-12 DIAGNOSIS — R8290 Unspecified abnormal findings in urine: Secondary | ICD-10-CM | POA: Diagnosis not present

## 2022-10-12 DIAGNOSIS — I169 Hypertensive crisis, unspecified: Secondary | ICD-10-CM | POA: Diagnosis not present

## 2022-10-12 DIAGNOSIS — R32 Unspecified urinary incontinence: Secondary | ICD-10-CM | POA: Diagnosis not present

## 2022-10-15 ENCOUNTER — Other Ambulatory Visit (HOSPITAL_COMMUNITY): Payer: Self-pay

## 2022-10-16 ENCOUNTER — Other Ambulatory Visit (HOSPITAL_COMMUNITY): Payer: Self-pay

## 2022-10-16 DIAGNOSIS — I169 Hypertensive crisis, unspecified: Secondary | ICD-10-CM | POA: Diagnosis not present

## 2022-10-17 ENCOUNTER — Other Ambulatory Visit (HOSPITAL_COMMUNITY): Payer: Self-pay

## 2022-10-17 DIAGNOSIS — I169 Hypertensive crisis, unspecified: Secondary | ICD-10-CM | POA: Diagnosis not present

## 2022-10-22 DIAGNOSIS — I169 Hypertensive crisis, unspecified: Secondary | ICD-10-CM | POA: Diagnosis not present

## 2022-10-23 DIAGNOSIS — I169 Hypertensive crisis, unspecified: Secondary | ICD-10-CM | POA: Diagnosis not present

## 2022-10-25 DIAGNOSIS — I169 Hypertensive crisis, unspecified: Secondary | ICD-10-CM | POA: Diagnosis not present

## 2022-10-26 DIAGNOSIS — I169 Hypertensive crisis, unspecified: Secondary | ICD-10-CM | POA: Diagnosis not present

## 2022-10-29 ENCOUNTER — Other Ambulatory Visit (HOSPITAL_COMMUNITY): Payer: Self-pay

## 2022-10-30 ENCOUNTER — Other Ambulatory Visit: Payer: Self-pay

## 2022-10-30 DIAGNOSIS — I169 Hypertensive crisis, unspecified: Secondary | ICD-10-CM | POA: Diagnosis not present

## 2022-10-31 DIAGNOSIS — I169 Hypertensive crisis, unspecified: Secondary | ICD-10-CM | POA: Diagnosis not present

## 2022-11-01 DIAGNOSIS — I169 Hypertensive crisis, unspecified: Secondary | ICD-10-CM | POA: Diagnosis not present

## 2022-11-02 DIAGNOSIS — I169 Hypertensive crisis, unspecified: Secondary | ICD-10-CM | POA: Diagnosis not present

## 2022-11-05 DIAGNOSIS — I169 Hypertensive crisis, unspecified: Secondary | ICD-10-CM | POA: Diagnosis not present

## 2022-11-06 DIAGNOSIS — I169 Hypertensive crisis, unspecified: Secondary | ICD-10-CM | POA: Diagnosis not present

## 2022-11-07 DIAGNOSIS — I169 Hypertensive crisis, unspecified: Secondary | ICD-10-CM | POA: Diagnosis not present

## 2022-11-08 DIAGNOSIS — I169 Hypertensive crisis, unspecified: Secondary | ICD-10-CM | POA: Diagnosis not present

## 2022-11-09 DIAGNOSIS — I169 Hypertensive crisis, unspecified: Secondary | ICD-10-CM | POA: Diagnosis not present

## 2022-11-10 NOTE — Progress Notes (Unsigned)
Established Patient Office Visit  Subjective   Patient ID: Jason Romero, male    DOB: [birth year]  Age: 51 y.o. MRN: [id number]  No chief complaint on file.   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 Accompanied by his wife.  7/5 Patients blood pressure today 99/65. Patient recently seen in May in the emergency for hypotension and pneumonia. He was given fluids and discharged home.Infection cleared. Patient states blood pressure at home has also been running low with similar readings. Patient is doing his best with eating and drinking but due to his reflux disease and stroke he has trouble at times.  His Gastroesophageal Reflux Disease continues to intermittently bother him. He experiences Nausea and vomiting after eating on occasion. Chronic low back pain is still an issue for him. Pain is worse in the morning and improves slightly throughout the day. Denies blood in stool, painful radiation, loss of bowel, urinary incontinence.   Patient due for Dental examination and cleaning. Referral given at prior visit and patient has not followed up yet. Continues to smoke 1 pack per day, recreational marijuana use, denies alcohol use.  7/26 Patient seen in return short-term follow-up.  This patient has had low blood pressure ranging in the 90/70 range in the home environment.  He comes in today for a blood pressure check.  We have had him hold his lisinopril since the last visit.  Patient denies any nausea vomiting chest pain or other symptom complex these  07/11/2022 Patient returns in follow-up on arrival blood pressure is much improved 116/82.  He has tolerated the adjustments in blood pressure medications.  He is still smoking however 10 cigarettes daily.  Does complain of some mild edema left lower extremity.   There are no other complaints.  He is accompanied with his wife who helps him with his care.  He was informed he cannot have a new wheelchair for another year based on Medicaid guidelines.  The current wheelchair is functional.  Patient does agree to and did receive a flu vaccine this visit.      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) 02/14/2021   Hyperlipidemia 02/14/2021   Visual changes 02/14/2021   Obesity 02/12/2021   Back pain 02/12/2021   Depression 01/26/2021   Spastic hemiplegia affecting nondominant side (HCC)    Dysphagia due to recent stroke 09/20/2020   History of intracranial hemorrhage 09/20/2020   History of stroke with residual deficit 09/11/2020   Sleep apnea 09/06/2016   Tobacco abuse 09/06/2016   Unintentional weight loss 03/22/2016   Essential hypertension 02/12/2016   Anxiety 02/12/2016   Migraines 02/12/2016   COPD (chronic obstructive pulmonary disease) (HCC) 02/01/2016   Gastroesophageal reflux disease without esophagitis 02/01/2016   Past Medical History:  Diagnosis Date   A-fib [facility])    Chronic headaches    COPD (chronic obstructive pulmonary disease) (HCC)    GERD (gastroesophageal reflux disease)    History of blood transfusion    Hypertension    MI (myocardial infarction) (HCC)    Murmur 02/12/2021   Seizures (HCC)    Stroke [facility])    Past Surgical History:  Procedure Laterality Date  CHOLECYSTECTOMY     Social History   Tobacco Use   Smoking status: Every Day    Packs/day: 1.00    Types: Cigarettes   Smokeless tobacco: Never  Vaping Use   Vaping Use: Never used  Substance Use Topics   Alcohol use: Not Currently    Alcohol/week: 0.0 standard drinks of alcohol   Drug use: Yes    Types: Marijuana    Comment: OCC   Social History   Socioeconomic  History   Marital status: Married    Spouse name: Not on file   Number of children: Not on file   Years of education: Not on file   Highest education level: Not on file  Occupational History   Not on file  Tobacco Use   Smoking status: Every Day    Packs/day: 1.00    Types: Cigarettes   Smokeless tobacco: Never  Vaping Use   Vaping Use: Never used  Substance and Sexual Activity   Alcohol use: Not Currently    Alcohol/week: 0.0 standard drinks of alcohol   Drug use: Yes    Types: Marijuana    Comment: OCC   Sexual activity: Not Currently  Other Topics Concern   Not on file  Social History Narrative   Left handed   Social Determinants of Health   Financial Resource Strain: Medium Risk (11/08/2021)   Overall Financial Resource Strain (CARDIA)    Difficulty of Paying Living Expenses: Somewhat hard  Food Insecurity: Not on file  Transportation Needs: Not on file  Physical Activity: Not on file  Stress: Not on file  Social Connections: Not on file  Intimate Partner Violence: Not on file   Family Status  Relation Name Status   Other  (Not Specified)   Other  (Not Specified)   Other  (Not Specified)   Mother  (Not Specified)   Family History  Problem Relation Age of Onset   Alcoholism Other    Arthritis Other    Breast cancer Other    Heart disease Mother    Allergies  Allergen Reactions   Hydrocodone Hives   Ibuprofen Other (See Comments)    Pt can't take this medication because it interacts with other medications that he is taking.     Review of Systems  Constitutional: Negative.  Negative for chills, diaphoresis, fever, malaise/fatigue and weight loss.  HENT:  Positive for hearing loss. Negative for congestion, ear discharge, ear pain, nosebleeds, sore throat and tinnitus.   Eyes: Negative.  Negative for blurred vision, double vision, photophobia and discharge.  Respiratory: Negative.  Negative for cough, hemoptysis, sputum production, shortness of breath,  wheezing and stridor.        No excess mucus  Cardiovascular:  Positive for leg swelling. Negative for chest pain, palpitations, orthopnea, claudication and PND.  Gastrointestinal:  Negative for abdominal pain, blood in stool, constipation, diarrhea, heartburn, melena, nausea and vomiting.  Genitourinary: Negative.  Negative for dysuria, flank pain, frequency, hematuria and urgency.  Musculoskeletal:  Negative for back pain, falls, joint pain, myalgias and neck pain.  Skin: Negative.  Negative for itching and rash.  Neurological: Negative.  Negative for dizziness, tingling, tremors, sensory change, speech change, focal weakness, seizures, loss of consciousness, weakness and headaches.  Endo/Heme/Allergies: Negative.  Negative for environmental allergies and polydipsia. Does not bruise/bleed easily.  Psychiatric/Behavioral: Negative.  Negative for depression, hallucinations, memory loss, substance abuse and suicidal ideas. The patient is not nervous/anxious and does not have insomnia.   All other systems  reviewed and are negative.     Objective:     There were no vitals taken for this visit. BP Readings from Last 3 Encounters:  07/11/22 116/82  06/06/22 110/74  05/08/22 99/65   Wt Readings from Last 3 Encounters:  05/02/21 227 lb (103 kg)  10/19/20 227 lb 9.6 oz (103.2 kg)  09/20/20 240 lb 15.4 oz (109.3 kg)      Physical Exam Vitals reviewed.  Constitutional:      Appearance: Normal appearance. He is well-developed. He is not diaphoretic.  HENT:     Head: Normocephalic and atraumatic.     Right Ear: External ear normal. There is no impacted cerumen.     Left Ear: External ear normal. There is no impacted cerumen.     Nose: Nose normal. No nasal deformity, septal deviation, mucosal edema or rhinorrhea.     Right Sinus: No maxillary sinus tenderness or frontal sinus tenderness.     Left Sinus: No maxillary sinus tenderness or frontal sinus tenderness.     Mouth/Throat:      Mouth: Mucous membranes are moist.     Pharynx: Oropharynx is clear. No oropharyngeal exudate.     Comments: Poor dentition Eyes:     General: No scleral icterus.    Conjunctiva/sclera: Conjunctivae normal.     Pupils: Pupils are equal, round, and reactive to light.  Neck:     Thyroid: No thyromegaly.     Vascular: No carotid bruit or JVD.     Trachea: Trachea normal. No tracheal tenderness or tracheal deviation.  Cardiovascular:     Rate and Rhythm: Normal rate and regular rhythm.     Chest Wall: PMI is not displaced.     Pulses: Normal pulses. No decreased pulses.     Heart sounds: Normal heart sounds, S1 normal and S2 normal. Heart sounds not distant. No murmur heard.    No systolic murmur is present.     No diastolic murmur is present.     No friction rub. No gallop. No S3 or S4 sounds.  Pulmonary:     Effort: Pulmonary effort is normal. No tachypnea, accessory muscle usage or respiratory distress.     Breath sounds: Normal breath sounds. No stridor. No decreased breath sounds, wheezing, rhonchi or rales.  Chest:     Chest wall: No tenderness.  Abdominal:     General: Abdomen is flat. Bowel sounds are normal. There is no distension.     Palpations: Abdomen is soft. Abdomen is not rigid.     Tenderness: There is no abdominal tenderness. There is no guarding or rebound.  Musculoskeletal:        General: Normal range of motion.     Cervical back: Normal range of motion and neck supple. No edema, erythema or rigidity. No muscular tenderness. Normal range of motion.     Lumbar back: Tenderness present.     Left lower leg: Edema present.     Comments: Very mild edema left lower extremity with cuffing at the site of the tight sock on his lower leg  Lymphadenopathy:     Head:     Right side of head: No submental or submandibular adenopathy.     Left side of head: No submental or submandibular adenopathy.     Cervical: No cervical adenopathy.  Skin:    General: Skin is warm and  dry.     Coloration: Skin is not pale.     Findings: No rash.  Nails: There is no clubbing.  Neurological:     Mental Status: He is alert and oriented to person, place, and time.     Sensory: No sensory deficit.     Motor: Weakness present.     Gait: Gait abnormal.     Comments: Upper and Lower Extremity Right sided parlysis  Psychiatric:        Speech: Speech normal.        Behavior: Behavior normal.      No results found for any visits on 11/13/22.  Last CBC Lab Results  Component Value Date   WBC 9.1 05/08/2022   HGB 11.2 (L) 05/08/2022   HCT 33.6 (L) 05/08/2022   MCV 86 05/08/2022   MCH 28.6 05/08/2022   RDW 13.1 05/08/2022   PLT 316 05/08/2022   Last metabolic panel Lab Results  Component Value Date   GLUCOSE 86 06/06/2022   NA 140 06/06/2022   K 4.1 06/06/2022   CL 104 06/06/2022   CO2 21 06/06/2022   BUN 14 06/06/2022   CREATININE 0.90 06/06/2022   GFRNONAA 59 (L) 03/15/2022   CALCIUM 8.5 (L) 06/06/2022   PHOS 4.2 09/19/2020   PROT 7.1 05/08/2022   ALBUMIN 4.5 05/08/2022   LABGLOB 2.6 05/08/2022   AGRATIO 1.7 05/08/2022   BILITOT <0.2 05/08/2022   ALKPHOS 97 05/08/2022   AST 12 05/08/2022   ALT 12 05/08/2022   ANIONGAP 11 03/15/2022   Last lipids Lab Results  Component Value Date   CHOL 96 (L) 01/08/2022   HDL 35 (L) 01/08/2022   LDLCALC 45 01/08/2022   TRIG 75 01/08/2022   CHOLHDL 2.7 01/08/2022   Last hemoglobin A1c Lab Results  Component Value Date   HGBA1C 5.6 09/12/2020   Last thyroid functions Lab Results  Component Value Date   TSH 0.64 03/22/2016   Last vitamin D No results found for: "25OHVITD2", "25OHVITD3", "VD25OH" Last vitamin B12 and Folate No results found for: "VITAMINB12", "FOLATE"  The ASCVD Risk score (Arnett DK, et al., 2019) failed to calculate for the following reasons:   The patient has a prior MI or stroke diagnosis    Assessment & Plan:   Problem List Items Addressed This Visit   None No follow-ups  on file.    Shan Levans, MD

## 2022-11-12 DIAGNOSIS — G8191 Hemiplegia, unspecified affecting right dominant side: Secondary | ICD-10-CM | POA: Diagnosis not present

## 2022-11-12 DIAGNOSIS — R8290 Unspecified abnormal findings in urine: Secondary | ICD-10-CM | POA: Diagnosis not present

## 2022-11-12 DIAGNOSIS — R32 Unspecified urinary incontinence: Secondary | ICD-10-CM | POA: Diagnosis not present

## 2022-11-13 ENCOUNTER — Ambulatory Visit: Payer: Medicaid Other | Attending: Critical Care Medicine | Admitting: Critical Care Medicine

## 2022-11-13 ENCOUNTER — Encounter: Payer: Self-pay | Admitting: Critical Care Medicine

## 2022-11-13 ENCOUNTER — Other Ambulatory Visit: Payer: Self-pay

## 2022-11-13 VITALS — BP 139/94 | HR 51

## 2022-11-13 DIAGNOSIS — I69391 Dysphagia following cerebral infarction: Secondary | ICD-10-CM | POA: Insufficient documentation

## 2022-11-13 DIAGNOSIS — J209 Acute bronchitis, unspecified: Secondary | ICD-10-CM

## 2022-11-13 DIAGNOSIS — Z8679 Personal history of other diseases of the circulatory system: Secondary | ICD-10-CM | POA: Diagnosis not present

## 2022-11-13 DIAGNOSIS — I169 Hypertensive crisis, unspecified: Secondary | ICD-10-CM | POA: Diagnosis not present

## 2022-11-13 DIAGNOSIS — B182 Chronic viral hepatitis C: Secondary | ICD-10-CM | POA: Diagnosis not present

## 2022-11-13 DIAGNOSIS — I4891 Unspecified atrial fibrillation: Secondary | ICD-10-CM | POA: Diagnosis not present

## 2022-11-13 DIAGNOSIS — J449 Chronic obstructive pulmonary disease, unspecified: Secondary | ICD-10-CM | POA: Diagnosis not present

## 2022-11-13 DIAGNOSIS — F17210 Nicotine dependence, cigarettes, uncomplicated: Secondary | ICD-10-CM | POA: Insufficient documentation

## 2022-11-13 DIAGNOSIS — I10 Essential (primary) hypertension: Secondary | ICD-10-CM | POA: Diagnosis not present

## 2022-11-13 DIAGNOSIS — Z720 Tobacco use: Secondary | ICD-10-CM | POA: Diagnosis not present

## 2022-11-13 DIAGNOSIS — G8929 Other chronic pain: Secondary | ICD-10-CM | POA: Diagnosis not present

## 2022-11-13 DIAGNOSIS — I619 Nontraumatic intracerebral hemorrhage, unspecified: Secondary | ICD-10-CM

## 2022-11-13 DIAGNOSIS — K219 Gastro-esophageal reflux disease without esophagitis: Secondary | ICD-10-CM | POA: Diagnosis not present

## 2022-11-13 DIAGNOSIS — I6930 Unspecified sequelae of cerebral infarction: Secondary | ICD-10-CM

## 2022-11-13 DIAGNOSIS — F1290 Cannabis use, unspecified, uncomplicated: Secondary | ICD-10-CM | POA: Insufficient documentation

## 2022-11-13 DIAGNOSIS — G8110 Spastic hemiplegia affecting unspecified side: Secondary | ICD-10-CM | POA: Diagnosis not present

## 2022-11-13 DIAGNOSIS — J432 Centrilobular emphysema: Secondary | ICD-10-CM

## 2022-11-13 DIAGNOSIS — M545 Low back pain, unspecified: Secondary | ICD-10-CM | POA: Insufficient documentation

## 2022-11-13 DIAGNOSIS — R269 Unspecified abnormalities of gait and mobility: Secondary | ICD-10-CM | POA: Diagnosis not present

## 2022-11-13 DIAGNOSIS — Z760 Encounter for issue of repeat prescription: Secondary | ICD-10-CM | POA: Diagnosis not present

## 2022-11-13 MED ORDER — TRAZODONE HCL 100 MG PO TABS
100.0000 mg | ORAL_TABLET | Freq: Every evening | ORAL | 2 refills | Status: DC | PRN
  Filled 2022-11-13: qty 60, 60d supply, fill #0
  Filled 2023-01-08: qty 90, 90d supply, fill #0

## 2022-11-13 MED ORDER — HYDROXYZINE PAMOATE 50 MG PO CAPS
50.0000 mg | ORAL_CAPSULE | Freq: Three times a day (TID) | ORAL | 2 refills | Status: DC | PRN
  Filled 2022-11-13: qty 90, 30d supply, fill #0
  Filled 2022-12-10: qty 90, 30d supply, fill #1
  Filled 2023-01-10: qty 90, 30d supply, fill #2

## 2022-11-13 MED ORDER — BACLOFEN 20 MG PO TABS
20.0000 mg | ORAL_TABLET | Freq: Three times a day (TID) | ORAL | 2 refills | Status: DC
  Filled 2022-11-13: qty 90, 30d supply, fill #0
  Filled 2022-12-10: qty 90, 30d supply, fill #1
  Filled 2023-01-10: qty 90, 30d supply, fill #2

## 2022-11-13 MED ORDER — AZITHROMYCIN 250 MG PO TABS
ORAL_TABLET | ORAL | 0 refills | Status: AC
  Filled 2022-11-13: qty 6, 5d supply, fill #0

## 2022-11-13 MED ORDER — PANTOPRAZOLE SODIUM 40 MG PO TBEC
40.0000 mg | DELAYED_RELEASE_TABLET | Freq: Every day | ORAL | 2 refills | Status: DC
  Filled 2022-11-13: qty 30, 30d supply, fill #0
  Filled 2023-02-07: qty 30, 30d supply, fill #1

## 2022-11-13 MED ORDER — HYDRALAZINE HCL 50 MG PO TABS
50.0000 mg | ORAL_TABLET | Freq: Two times a day (BID) | ORAL | 1 refills | Status: DC
  Filled 2022-11-13: qty 180, 90d supply, fill #0
  Filled 2023-02-26: qty 180, 90d supply, fill #1

## 2022-11-13 MED ORDER — ATORVASTATIN CALCIUM 20 MG PO TABS
20.0000 mg | ORAL_TABLET | Freq: Every day | ORAL | 1 refills | Status: DC
  Filled 2022-11-13 – 2023-02-07 (×2): qty 90, 90d supply, fill #0

## 2022-11-13 MED ORDER — PREGABALIN 150 MG PO CAPS
150.0000 mg | ORAL_CAPSULE | Freq: Two times a day (BID) | ORAL | 4 refills | Status: DC
  Filled 2022-11-13: qty 60, 30d supply, fill #0
  Filled 2022-12-14: qty 60, 30d supply, fill #1
  Filled 2023-01-10: qty 60, 30d supply, fill #2
  Filled 2023-02-14: qty 60, 30d supply, fill #3

## 2022-11-13 MED ORDER — LABETALOL HCL 300 MG PO TABS
300.0000 mg | ORAL_TABLET | Freq: Two times a day (BID) | ORAL | 2 refills | Status: DC
  Filled 2022-11-13: qty 60, 30d supply, fill #0
  Filled 2022-12-10: qty 60, 30d supply, fill #1
  Filled 2023-01-10: qty 60, 30d supply, fill #2
  Filled 2023-02-14: qty 60, 30d supply, fill #3

## 2022-11-13 MED ORDER — AMLODIPINE BESYLATE 10 MG PO TABS
10.0000 mg | ORAL_TABLET | Freq: Every day | ORAL | 1 refills | Status: DC
  Filled 2022-11-13: qty 90, 90d supply, fill #0
  Filled 2023-02-26: qty 90, 90d supply, fill #1

## 2022-11-13 NOTE — Patient Instructions (Signed)
Labs today includes metabolic panel  Start azithromycin take to the first day then 1 a day till gone get this to the pharmacy downstairs  All other medications have been refilled to the [facility] will be mailed to your home  A new wheelchair order was placed  Return to Dr. Joya Gaskins 3 months

## 2022-11-14 ENCOUNTER — Telehealth: Payer: Self-pay | Admitting: Critical Care Medicine

## 2022-11-14 ENCOUNTER — Other Ambulatory Visit: Payer: Self-pay

## 2022-11-14 ENCOUNTER — Other Ambulatory Visit (HOSPITAL_COMMUNITY): Payer: Self-pay

## 2022-11-14 ENCOUNTER — Telehealth: Payer: Self-pay

## 2022-11-14 DIAGNOSIS — I169 Hypertensive crisis, unspecified: Secondary | ICD-10-CM | POA: Diagnosis not present

## 2022-11-14 DIAGNOSIS — J209 Acute bronchitis, unspecified: Secondary | ICD-10-CM | POA: Insufficient documentation

## 2022-11-14 LAB — BMP8+EGFR
BUN/Creatinine Ratio: 15 (ref 9–20)
BUN: 12 mg/dL (ref 6–24)
CO2: 25 mmol/L (ref 20–29)
Calcium: 9.7 mg/dL (ref 8.7–10.2)
Chloride: 101 mmol/L (ref 96–106)
Creatinine, Ser: 0.82 mg/dL (ref 0.76–1.27)
Glucose: 72 mg/dL (ref 70–99)
Potassium: 4.2 mmol/L (ref 3.5–5.2)
Sodium: 140 mmol/L (ref 134–144)
eGFR: 105 mL/min/{1.73_m2} (ref >59)

## 2022-11-14 NOTE — Assessment & Plan Note (Signed)
Will give a 5-day course of azithromycin

## 2022-11-14 NOTE — Assessment & Plan Note (Signed)
Hypertension not well-controlled will not make changes today as we have overcorrected in the past refill all medications given

## 2022-11-14 NOTE — Progress Notes (Signed)
Let pt know labs all normal

## 2022-11-14 NOTE — Telephone Encounter (Signed)
Order received for manual wheelchair, I called patient's wife to inquire if they have a preference for DME companies.  She said they have no preference and was agreeable to having the order sent to [facility].    Order then faxed to [place] [facility]

## 2022-11-14 NOTE — Assessment & Plan Note (Signed)
Will order another wheelchair he is due because of deterioration of current chair

## 2022-11-14 NOTE — Telephone Encounter (Signed)
Copied from [place] [id number]. Topic: General - Inquiry >> Nov 14, 2022  9:30 AM Marcellus Scott wrote: Reason for CRM: Roderic Palau from [facility] is calling to follow up on [id number] form [employer] request faxed on 12/27. Roderic Palau, requesting an update, stated he would re-fax today.  Please advise.

## 2022-11-14 NOTE — Assessment & Plan Note (Signed)
Not active we will monitor

## 2022-11-14 NOTE — Assessment & Plan Note (Signed)
    Current smoking consumption amount: 10 cigarettes daily  Dicsussion on advise to quit smoking and smoking impacts: Cardiovascular impacts  Patient's willingness to quit: Not ready to quit  Methods to quit smoking discussed: Nicotine replacement  Medication management of smoking session drugs discussed: Nicotine replacement  Resources provided:  AVS   Setting quit date not established  Follow-up arranged 4 mo   Time spent counseling the patient: 5 minutes

## 2022-11-15 ENCOUNTER — Other Ambulatory Visit: Payer: Self-pay

## 2022-11-15 ENCOUNTER — Telehealth: Payer: Self-pay

## 2022-11-15 NOTE — Telephone Encounter (Signed)
Pt wife (on dpr) was called and is aware of results, DOB was confirmed.

## 2022-11-15 NOTE — Telephone Encounter (Signed)
-----   Message from Elsie Stain, MD sent at 11/14/2022  6:16 AM EST ----- Let pt know labs all normal

## 2022-11-16 DIAGNOSIS — I169 Hypertensive crisis, unspecified: Secondary | ICD-10-CM | POA: Diagnosis not present

## 2022-11-19 ENCOUNTER — Other Ambulatory Visit (HOSPITAL_COMMUNITY): Payer: Self-pay

## 2022-11-20 NOTE — Telephone Encounter (Signed)
He said he just refaxed the paperwork  you should see it now.Marland KitchenMarland Kitchen

## 2022-11-20 NOTE — Telephone Encounter (Signed)
Haven't seen paperwork, called [name] and left voicemail with fax number to refax paperwork

## 2022-11-27 DIAGNOSIS — I169 Hypertensive crisis, unspecified: Secondary | ICD-10-CM | POA: Diagnosis not present

## 2022-11-28 DIAGNOSIS — I169 Hypertensive crisis, unspecified: Secondary | ICD-10-CM | POA: Diagnosis not present

## 2022-11-30 DIAGNOSIS — I169 Hypertensive crisis, unspecified: Secondary | ICD-10-CM | POA: Diagnosis not present

## 2022-12-04 DIAGNOSIS — I169 Hypertensive crisis, unspecified: Secondary | ICD-10-CM | POA: Diagnosis not present

## 2022-12-07 DIAGNOSIS — I169 Hypertensive crisis, unspecified: Secondary | ICD-10-CM | POA: Diagnosis not present

## 2022-12-10 ENCOUNTER — Other Ambulatory Visit: Payer: Self-pay

## 2022-12-10 ENCOUNTER — Other Ambulatory Visit (HOSPITAL_COMMUNITY): Payer: Self-pay

## 2022-12-11 ENCOUNTER — Other Ambulatory Visit: Payer: Self-pay

## 2022-12-13 DIAGNOSIS — R8290 Unspecified abnormal findings in urine: Secondary | ICD-10-CM | POA: Diagnosis not present

## 2022-12-13 DIAGNOSIS — G8191 Hemiplegia, unspecified affecting right dominant side: Secondary | ICD-10-CM | POA: Diagnosis not present

## 2022-12-13 DIAGNOSIS — R32 Unspecified urinary incontinence: Secondary | ICD-10-CM | POA: Diagnosis not present

## 2022-12-14 ENCOUNTER — Other Ambulatory Visit (HOSPITAL_COMMUNITY): Payer: Self-pay

## 2022-12-14 DIAGNOSIS — I169 Hypertensive crisis, unspecified: Secondary | ICD-10-CM | POA: Diagnosis not present

## 2022-12-17 ENCOUNTER — Other Ambulatory Visit (HOSPITAL_BASED_OUTPATIENT_CLINIC_OR_DEPARTMENT_OTHER): Payer: Self-pay

## 2022-12-17 DIAGNOSIS — I169 Hypertensive crisis, unspecified: Secondary | ICD-10-CM | POA: Diagnosis not present

## 2022-12-18 DIAGNOSIS — I169 Hypertensive crisis, unspecified: Secondary | ICD-10-CM | POA: Diagnosis not present

## 2022-12-19 DIAGNOSIS — I169 Hypertensive crisis, unspecified: Secondary | ICD-10-CM | POA: Diagnosis not present

## 2022-12-27 DIAGNOSIS — I169 Hypertensive crisis, unspecified: Secondary | ICD-10-CM | POA: Diagnosis not present

## 2022-12-28 DIAGNOSIS — I169 Hypertensive crisis, unspecified: Secondary | ICD-10-CM | POA: Diagnosis not present

## 2022-12-30 ENCOUNTER — Telehealth: Payer: Self-pay | Admitting: Critical Care Medicine

## 2022-12-30 NOTE — Telephone Encounter (Signed)
Pls call the pt and see if he has any recent home bp readings

## 2022-12-31 ENCOUNTER — Telehealth: Payer: Self-pay

## 2022-12-31 DIAGNOSIS — I169 Hypertensive crisis, unspecified: Secondary | ICD-10-CM | POA: Diagnosis not present

## 2022-12-31 NOTE — Telephone Encounter (Signed)
I spoke to [facility] [place] who confirmed that the wheelchair order has been processed and they are good to go. However, they called the patient and informed him that he has an out of pocket expense: $116.02.  She said that the patient/ family told them they would have to call back, the have not yet accepted the wheelchair.

## 2023-01-01 ENCOUNTER — Encounter: Payer: Self-pay | Admitting: Critical Care Medicine

## 2023-01-01 DIAGNOSIS — I169 Hypertensive crisis, unspecified: Secondary | ICD-10-CM | POA: Diagnosis not present

## 2023-01-01 NOTE — Progress Notes (Signed)
Home BP reading today 113/77 at goal no medication change

## 2023-01-01 NOTE — Telephone Encounter (Signed)
Excellent I will document in system

## 2023-01-01 NOTE — Telephone Encounter (Signed)
Called patient and spoke with wife(on dpr) and she took his bp it was 113 60

## 2023-01-01 NOTE — Telephone Encounter (Signed)
NOTED

## 2023-01-02 DIAGNOSIS — I169 Hypertensive crisis, unspecified: Secondary | ICD-10-CM | POA: Diagnosis not present

## 2023-01-03 DIAGNOSIS — I169 Hypertensive crisis, unspecified: Secondary | ICD-10-CM | POA: Diagnosis not present

## 2023-01-04 DIAGNOSIS — I169 Hypertensive crisis, unspecified: Secondary | ICD-10-CM | POA: Diagnosis not present

## 2023-01-07 DIAGNOSIS — I169 Hypertensive crisis, unspecified: Secondary | ICD-10-CM | POA: Diagnosis not present

## 2023-01-08 ENCOUNTER — Other Ambulatory Visit (HOSPITAL_COMMUNITY): Payer: Self-pay

## 2023-01-08 DIAGNOSIS — I169 Hypertensive crisis, unspecified: Secondary | ICD-10-CM | POA: Diagnosis not present

## 2023-01-09 DIAGNOSIS — I169 Hypertensive crisis, unspecified: Secondary | ICD-10-CM | POA: Diagnosis not present

## 2023-01-10 ENCOUNTER — Other Ambulatory Visit: Payer: Self-pay

## 2023-01-10 DIAGNOSIS — I169 Hypertensive crisis, unspecified: Secondary | ICD-10-CM | POA: Diagnosis not present

## 2023-01-11 DIAGNOSIS — R8290 Unspecified abnormal findings in urine: Secondary | ICD-10-CM | POA: Diagnosis not present

## 2023-01-11 DIAGNOSIS — R32 Unspecified urinary incontinence: Secondary | ICD-10-CM | POA: Diagnosis not present

## 2023-01-11 DIAGNOSIS — I169 Hypertensive crisis, unspecified: Secondary | ICD-10-CM | POA: Diagnosis not present

## 2023-01-11 DIAGNOSIS — G8191 Hemiplegia, unspecified affecting right dominant side: Secondary | ICD-10-CM | POA: Diagnosis not present

## 2023-01-14 DIAGNOSIS — I169 Hypertensive crisis, unspecified: Secondary | ICD-10-CM | POA: Diagnosis not present

## 2023-01-15 DIAGNOSIS — I169 Hypertensive crisis, unspecified: Secondary | ICD-10-CM | POA: Diagnosis not present

## 2023-01-16 DIAGNOSIS — I169 Hypertensive crisis, unspecified: Secondary | ICD-10-CM | POA: Diagnosis not present

## 2023-01-21 DIAGNOSIS — I169 Hypertensive crisis, unspecified: Secondary | ICD-10-CM | POA: Diagnosis not present

## 2023-01-22 DIAGNOSIS — I169 Hypertensive crisis, unspecified: Secondary | ICD-10-CM | POA: Diagnosis not present

## 2023-01-24 DIAGNOSIS — I169 Hypertensive crisis, unspecified: Secondary | ICD-10-CM | POA: Diagnosis not present

## 2023-01-25 DIAGNOSIS — I169 Hypertensive crisis, unspecified: Secondary | ICD-10-CM | POA: Diagnosis not present

## 2023-01-28 DIAGNOSIS — I169 Hypertensive crisis, unspecified: Secondary | ICD-10-CM | POA: Diagnosis not present

## 2023-01-29 DIAGNOSIS — I169 Hypertensive crisis, unspecified: Secondary | ICD-10-CM | POA: Diagnosis not present

## 2023-01-30 DIAGNOSIS — I169 Hypertensive crisis, unspecified: Secondary | ICD-10-CM | POA: Diagnosis not present

## 2023-01-31 DIAGNOSIS — I169 Hypertensive crisis, unspecified: Secondary | ICD-10-CM | POA: Diagnosis not present

## 2023-02-01 DIAGNOSIS — I169 Hypertensive crisis, unspecified: Secondary | ICD-10-CM | POA: Diagnosis not present

## 2023-02-04 DIAGNOSIS — I169 Hypertensive crisis, unspecified: Secondary | ICD-10-CM | POA: Diagnosis not present

## 2023-02-06 DIAGNOSIS — I169 Hypertensive crisis, unspecified: Secondary | ICD-10-CM | POA: Diagnosis not present

## 2023-02-07 ENCOUNTER — Other Ambulatory Visit: Payer: Self-pay

## 2023-02-07 ENCOUNTER — Other Ambulatory Visit (HOSPITAL_COMMUNITY): Payer: Self-pay

## 2023-02-07 DIAGNOSIS — I169 Hypertensive crisis, unspecified: Secondary | ICD-10-CM | POA: Diagnosis not present

## 2023-02-08 DIAGNOSIS — I169 Hypertensive crisis, unspecified: Secondary | ICD-10-CM | POA: Diagnosis not present

## 2023-02-12 DIAGNOSIS — I169 Hypertensive crisis, unspecified: Secondary | ICD-10-CM | POA: Diagnosis not present

## 2023-02-13 DIAGNOSIS — I169 Hypertensive crisis, unspecified: Secondary | ICD-10-CM | POA: Diagnosis not present

## 2023-02-14 ENCOUNTER — Other Ambulatory Visit: Payer: Self-pay

## 2023-02-14 ENCOUNTER — Other Ambulatory Visit: Payer: Self-pay | Admitting: Critical Care Medicine

## 2023-02-14 ENCOUNTER — Other Ambulatory Visit (HOSPITAL_COMMUNITY): Payer: Self-pay

## 2023-02-14 DIAGNOSIS — I169 Hypertensive crisis, unspecified: Secondary | ICD-10-CM | POA: Diagnosis not present

## 2023-02-14 NOTE — Telephone Encounter (Signed)
Unable to refill per protocol, Rx request is too soon. Last refill 11/13/22 for 90 days and 1 refill.  Requested Prescriptions  Pending Prescriptions Disp Refills   baclofen (LIORESAL) 20 MG tablet 90 tablet 2    Sig: Take 1 tablet (20 mg total) by mouth 3 (three) times daily.     Analgesics:  Muscle Relaxants - baclofen Passed - 02/14/2023  9:12 AM      Passed - Cr in normal range and within 180 days    Creat  Date Value Ref Range Status  04/28/2021 0.92 0.70 - 1.33 mg/dL Final    Comment:    For patients >69 years of age, the reference limit for Creatinine is approximately 13% higher for people identified as African-American. .    Creatinine, Ser  Date Value Ref Range Status  11/13/2022 0.82 0.76 - 1.27 mg/dL Final         Passed - eGFR is 30 or above and within 180 days    EGFR (African American)  Date Value Ref Range Status  03/10/2012 >60  Final   GFR calc Af Amer  Date Value Ref Range Status  11/09/2020 109 >59 mL/min/1.73 Final    Comment:    **In accordance with recommendations from the NKF-ASN Task force,**   [facility] is in the process of updating its eGFR calculation to the   2021 CKD-EPI creatinine equation that estimates kidney function   without a race variable.    EGFR (Non-African Amer.)  Date Value Ref Range Status  03/10/2012 >60  Final    Comment:    eGFR values <72mL/min/1.73 m2 may be an indication of chronic kidney disease (CKD). Calculated eGFR is useful in patients with stable renal function. The eGFR calculation will not be reliable in acutely ill patients when serum creatinine is changing rapidly. It is not useful in  patients on dialysis. The eGFR calculation may not be applicable to patients at the low and high extremes of body sizes, pregnant women, and vegetarians.    GFR, Estimated  Date Value Ref Range Status  03/15/2022 59 (L) >60 mL/min Final    Comment:    (NOTE) Calculated using the CKD-EPI Creatinine Equation (2021)    GFR   Date Value Ref Range Status  02/27/2017 70.92 >60.00 mL/min Final   eGFR  Date Value Ref Range Status  11/13/2022 105 >59 mL/min/1.73 Final         Passed - Valid encounter within last 6 months    Recent Outpatient Visits           3 months ago Essential hypertension   [place], MD   7 months ago Essential hypertension   [place], MD   8 months ago Essential hypertension   [place], MD   9 months ago Essential hypertension   [place] Elsie Stain, MD   1 year ago Essential hypertension   [place], MD       Future Appointments             In 1 week Elsie Stain, MD [place]             hydrOXYzine (VISTARIL) 50 MG capsule 90 capsule 2    Sig: Take 1 capsule (  50 mg total) by mouth every 8 (eight) hours as needed for anxiety.     Ear, Nose, and Throat:  Antihistamines 2 Passed - 02/14/2023  9:12 AM      Passed - Cr in normal range and within 360 days    Creat  Date Value Ref Range Status  04/28/2021 0.92 0.70 - 1.33 mg/dL Final    Comment:    For patients >15 years of age, the reference limit for Creatinine is approximately 13% higher for people identified as African-American. .    Creatinine, Ser  Date Value Ref Range Status  11/13/2022 0.82 0.76 - 1.27 mg/dL Final         Passed - Valid encounter within last 12 months    Recent Outpatient Visits           3 months ago Essential hypertension   [place], MD   7 months ago Essential hypertension   [place], MD   8 months ago Essential hypertension   [place], MD   9 months ago Essential hypertension   [place] Elsie Stain, MD   1 year ago Essential hypertension   [place], MD       Future Appointments             In 1 week Elsie Stain, MD [place]

## 2023-02-15 DIAGNOSIS — I169 Hypertensive crisis, unspecified: Secondary | ICD-10-CM | POA: Diagnosis not present

## 2023-02-18 ENCOUNTER — Other Ambulatory Visit (HOSPITAL_COMMUNITY): Payer: Self-pay

## 2023-02-18 ENCOUNTER — Other Ambulatory Visit: Payer: Self-pay | Admitting: Critical Care Medicine

## 2023-02-18 ENCOUNTER — Telehealth: Payer: Self-pay | Admitting: Critical Care Medicine

## 2023-02-18 NOTE — Telephone Encounter (Signed)
Medication Refill - Medication:  hydrOXYzine (VISTARIL) 50 MG capsule  baclofen (LIORESAL) 20 MG tablet   Has the patient contacted their pharmacy? Yes.   (Agent: If no, request that the patient contact the pharmacy for the refill. If patient does not wish to contact the pharmacy document the reason why and proceed with request.) (Agent: If yes, when and what did the pharmacy advise?)  Preferred Pharmacy (with phone number or street name):  [place] [facility] Phone: [phone]  Fax: [phone]     Has the patient been seen for an appointment in the last year OR does the patient have an upcoming appointment? Yes.    Agent: Please be advised that RX refills may take up to 3 business days. We ask that you follow-up with your pharmacy.

## 2023-02-18 NOTE — Telephone Encounter (Signed)
Already requested by pharmacy in another encounter on 02/18/23.

## 2023-02-18 NOTE — Telephone Encounter (Signed)
Pt wife [name] is calling to check on his medication. Per [name] when she spoke with the pharmacy they advised it is not too early for his medication refill and the pt is needing his medication. Pt is out of medication. Please advise.

## 2023-02-19 ENCOUNTER — Other Ambulatory Visit (HOSPITAL_COMMUNITY): Payer: Self-pay

## 2023-02-19 MED ORDER — BACLOFEN 20 MG PO TABS
20.0000 mg | ORAL_TABLET | Freq: Three times a day (TID) | ORAL | 0 refills | Status: DC
  Filled 2023-02-19: qty 90, 30d supply, fill #0

## 2023-02-19 MED ORDER — HYDROXYZINE PAMOATE 50 MG PO CAPS
50.0000 mg | ORAL_CAPSULE | Freq: Three times a day (TID) | ORAL | 0 refills | Status: DC | PRN
  Filled 2023-02-19: qty 90, 30d supply, fill #0

## 2023-02-19 NOTE — Telephone Encounter (Signed)
Requested Prescriptions  Pending Prescriptions Disp Refills   baclofen (LIORESAL) 20 MG tablet 270 tablet 0    Sig: Take 1 tablet (20 mg total) by mouth 3 (three) times daily.     Analgesics:  Muscle Relaxants - baclofen Passed - 02/18/2023  1:49 PM      Passed - Cr in normal range and within 180 days    Creat  Date Value Ref Range Status  04/28/2021 0.92 0.70 - 1.33 mg/dL Final    Comment:    For patients >53 years of age, the reference limit for Creatinine is approximately 13% higher for people identified as African-American. .    Creatinine, Ser  Date Value Ref Range Status  11/13/2022 0.82 0.76 - 1.27 mg/dL Final         Passed - eGFR is 30 or above and within 180 days    EGFR (African American)  Date Value Ref Range Status  03/10/2012 >60  Final   GFR calc Af Amer  Date Value Ref Range Status  11/09/2020 109 >59 mL/min/1.73 Final    Comment:    **In accordance with recommendations from the NKF-ASN Task force,**   [facility] is in the process of updating its eGFR calculation to the   2021 CKD-EPI creatinine equation that estimates kidney function   without a race variable.    EGFR (Non-African Amer.)  Date Value Ref Range Status  03/10/2012 >60  Final    Comment:    eGFR values <31mL/min/1.73 m2 may be an indication of chronic kidney disease (CKD). Calculated eGFR is useful in patients with stable renal function. The eGFR calculation will not be reliable in acutely ill patients when serum creatinine is changing rapidly. It is not useful in  patients on dialysis. The eGFR calculation may not be applicable to patients at the low and high extremes of body sizes, pregnant women, and vegetarians.    GFR, Estimated  Date Value Ref Range Status  03/15/2022 59 (L) >60 mL/min Final    Comment:    (NOTE) Calculated using the CKD-EPI Creatinine Equation (2021)    GFR  Date Value Ref Range Status  02/27/2017 70.92 >60.00 mL/min Final   eGFR  Date Value Ref Range  Status  11/13/2022 105 >59 mL/min/1.73 Final         Passed - Valid encounter within last 6 months    Recent Outpatient Visits           3 months ago Essential hypertension   [place] [facility] Storm Frisk, MD   7 months ago Essential hypertension   [place] [facility] Storm Frisk, MD   8 months ago Essential hypertension   [place] [facility] Storm Frisk[facility] MD   9 months ago Essential hypertension   [place] [facility] Storm Frisk, MD   1 year ago Essential hypertension    [facility] Storm Frisk, MD       Future Appointments             In 1 week Storm Frisk, MD [facility]             hydrOXYzine (VISTARIL) 50 MG capsule 270 capsule 0    Sig: Take 1 capsule (50 mg total) by mouth every 8 (eight) hours as needed for anxiety.     Ear, Nose, and  Throat:  Antihistamines 2 Passed - 02/18/2023  1:49 PM      Passed - Cr in normal range and within 360 days    Creat  Date Value Ref Range Status  04/28/2021 0.92 0.70 - 1.33 mg/dL Final    Comment:    For patients >54 years of age, the reference limit for Creatinine is approximately 13% higher for people identified as African-American. .    Creatinine, Ser  Date Value Ref Range Status  11/13/2022 0.82 0.76 - 1.27 mg/dL Final         Passed - Valid encounter within last 12 months    Recent Outpatient Visits           3 months ago Essential hypertension   [place] [facility] Storm Frisk, MD   7 months ago Essential hypertension   [place] [facility] Storm Frisk, MD   8 months ago Essential hypertension   [place] [facility] Storm Frisk, MD   9 months ago Essential hypertension   [place] [facility] Storm Frisk, MD   1 year ago Essential hypertension   [place] [facility] Storm Frisk, MD       Future Appointments             In 1 week Storm Frisk, MD [facility]

## 2023-02-20 ENCOUNTER — Other Ambulatory Visit (HOSPITAL_COMMUNITY): Payer: Self-pay

## 2023-02-25 DIAGNOSIS — I169 Hypertensive crisis, unspecified: Secondary | ICD-10-CM | POA: Diagnosis not present

## 2023-02-25 NOTE — Progress Notes (Unsigned)
Established Patient Office Visit  Subjective   Patient ID: Jason Romero, male    DOB: 1971-10-08  Age: 52 y.o. MRN: [id number]  No chief complaint on file.   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 Accompanied by his wife.  7/5 Patients blood pressure today 99/65. Patient recently seen in May in the emergency for hypotension and pneumonia. He was given fluids and discharged home.Infection cleared. Patient states blood pressure at home has also been running low with similar readings. Patient is doing his best with eating and drinking but due to his reflux disease and stroke he has trouble at times.  His Gastroesophageal Reflux Disease continues to intermittently bother him. He experiences Nausea and vomiting after eating on occasion. Chronic low back pain is still an issue for him. Pain is worse in the morning and improves slightly throughout the day. Denies blood in stool, painful radiation, loss of bowel, urinary incontinence.   Patient due for Dental examination and cleaning. Referral given at prior visit and patient has not followed up yet. Continues to smoke 1 pack per day, recreational marijuana use, denies alcohol use.  7/26 Patient seen in return short-term follow-up.  This patient has had low blood pressure ranging in the 90/70 range in the home environment.  He comes in today for a blood pressure check.  We have had him hold his lisinopril since the last visit.  Patient denies any nausea vomiting chest pain or other symptom complex these  07/11/2022 Patient returns in follow-up on arrival blood pressure is much improved 116/82.  He has tolerated the adjustments in blood pressure medications.  He is still smoking however 10 cigarettes daily.  Does complain of some mild edema left lower extremity.   There are no other complaints.  He is accompanied with his wife who helps him with his care.  He was informed he cannot have a new wheelchair for another year based on Medicaid guidelines.  The current wheelchair is functional.  Patient does agree to and did receive a flu vaccine this visit.  11/13/22  This patient is seen in return follow-up as had a recent viral URI and now has increased cough productive of white to brown mucus without hemoptysis he states he is not having any increased shortness of breath or chest pain.  Cough is worse at night.  He continues to smoke 10 cigarettes daily.  Patient also has elevated blood pressure on arrival 139/94.  In the past we have tried to regulate medication strictly he has become hypotensive.  There are no other complaints He is wheelchair-bound and needs a new wheelchair.  He is accompanied by his spouse.    Patient Active Problem List   Diagnosis Date Noted   Acute tracheobronchitis 11/14/2022   Bilateral impacted cerumen 05/08/2022   Chronic hepatitis C without hepatic coma 02/28/2021   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) 02/14/2021   Hyperlipidemia 02/14/2021   Visual changes 02/14/2021   Obesity 02/12/2021   Back pain 02/12/2021   Depression 01/26/2021   Spastic hemiplegia affecting nondominant side    Dysphagia due to recent stroke 09/20/2020   History of intracranial hemorrhage 09/20/2020   History of stroke with residual deficit 09/11/2020   Sleep apnea 09/06/2016   Tobacco abuse 09/06/2016   Unintentional weight loss 03/22/2016  Essential hypertension 02/12/2016   Anxiety 02/12/2016   Migraines 02/12/2016   COPD (chronic obstructive pulmonary disease) 02/01/2016   Gastroesophageal reflux disease without esophagitis 02/01/2016   Past Medical History:  Diagnosis Date   A-fib [facility])    Chronic headaches    COPD (chronic obstructive pulmonary disease) (HCC)    GERD (gastroesophageal reflux disease)     History of blood transfusion    Hypertension    MI (myocardial infarction) (HCC)    Murmur 02/12/2021   Seizures (HCC)    Stroke [facility])    Past Surgical History:  Procedure Laterality Date   CHOLECYSTECTOMY     Social History   Tobacco Use   Smoking status: Every Day    Packs/day: 1    Types: Cigarettes   Smokeless tobacco: Never  Vaping Use   Vaping Use: Never used  Substance Use Topics   Alcohol use: Not Currently    Alcohol/week: 0.0 standard drinks of alcohol   Drug use: Yes    Types: Marijuana    Comment: OCC   Social History   Socioeconomic History   Marital status: Married    Spouse name: Not on file   Number of children: Not on file   Years of education: Not on file   Highest education level: Not on file  Occupational History   Not on file  Tobacco Use   Smoking status: Every Day    Packs/day: 1    Types: Cigarettes   Smokeless tobacco: Never  Vaping Use   Vaping Use: Never used  Substance and Sexual Activity   Alcohol use: Not Currently    Alcohol/week: 0.0 standard drinks of alcohol   Drug use: Yes    Types: Marijuana    Comment: OCC   Sexual activity: Not Currently  Other Topics Concern   Not on file  Social History Narrative   Left handed   Social Determinants of Health   Financial Resource Strain: Medium Risk (11/08/2021)   Overall Financial Resource Strain (CARDIA)    Difficulty of Paying Living Expenses: Somewhat hard  Food Insecurity: Not on file  Transportation Needs: Not on file  Physical Activity: Not on file  Stress: Not on file  Social Connections: Not on file  Intimate Partner Violence: Not on file   Family Status  Relation Name Status   Other  (Not Specified)   Other  (Not Specified)   Other  (Not Specified)   Mother  (Not Specified)   Family History  Problem Relation Age of Onset   Alcoholism Other    Arthritis Other    Breast cancer Other    Heart disease Mother    Allergies  Allergen Reactions    Hydrocodone Hives   Ibuprofen Other (See Comments)    Pt can't take this medication because it interacts with other medications that he is taking.     Review of Systems  Constitutional: Negative.  Negative for chills, diaphoresis, fever, malaise/fatigue and weight loss.  HENT:  Positive for congestion and hearing loss. Negative for ear discharge, ear pain, nosebleeds, sore throat and tinnitus.   Eyes: Negative.  Negative for blurred vision, double vision, photophobia and discharge.  Respiratory:  Positive for cough and sputum production. Negative for hemoptysis, shortness of breath, wheezing and stridor.        No excess mucus  Cardiovascular:  Negative for chest pain, palpitations, orthopnea, claudication, leg swelling and PND.  Gastrointestinal:  Negative for abdominal pain, blood in stool, constipation, diarrhea, heartburn, melena,  nausea and vomiting.  Genitourinary: Negative.  Negative for dysuria, flank pain, frequency, hematuria and urgency.  Musculoskeletal:  Negative for back pain, falls, joint pain, myalgias and neck pain.  Skin: Negative.  Negative for itching and rash.  Neurological: Negative.  Negative for dizziness, tingling, tremors, sensory change, speech change, focal weakness, seizures, loss of consciousness, weakness and headaches.  Endo/Heme/Allergies: Negative.  Negative for environmental allergies and polydipsia. Does not bruise/bleed easily.  Psychiatric/Behavioral: Negative.  Negative for depression, hallucinations, memory loss, substance abuse and suicidal ideas. The patient is not nervous/anxious and does not have insomnia.   All other systems reviewed and are negative.     Objective:     There were no vitals taken for this visit. BP Readings from Last 3 Encounters:  01/01/23 113/77  11/13/22 (!) 139/94  07/11/22 116/82   Wt Readings from Last 3 Encounters:  05/02/21 227 lb (103 kg)  10/19/20 227 lb 9.6 oz (103.2 kg)  09/20/20 240 lb 15.4 oz (109.3 kg)       Physical Exam Vitals reviewed.  Constitutional:      Appearance: Normal appearance. He is well-developed. He is not diaphoretic.  HENT:     Head: Normocephalic and atraumatic.     Right Ear: External ear normal. There is no impacted cerumen.     Left Ear: External ear normal. There is no impacted cerumen.     Nose: Congestion and rhinorrhea present. No nasal deformity, septal deviation or mucosal edema.     Right Sinus: No maxillary sinus tenderness or frontal sinus tenderness.     Left Sinus: No maxillary sinus tenderness or frontal sinus tenderness.     Mouth/Throat:     Mouth: Mucous membranes are moist.     Pharynx: Oropharynx is clear. No oropharyngeal exudate or posterior oropharyngeal erythema.     Comments: Poor dentition Eyes:     General: No scleral icterus.    Conjunctiva/sclera: Conjunctivae normal.     Pupils: Pupils are equal, round, and reactive to light.  Neck:     Thyroid: No thyromegaly.     Vascular: No carotid bruit or JVD.     Trachea: Trachea normal. No tracheal tenderness or tracheal deviation.  Cardiovascular:     Rate and Rhythm: Normal rate and regular rhythm.     Chest Wall: PMI is not displaced.     Pulses: Normal pulses. No decreased pulses.     Heart sounds: Normal heart sounds, S1 normal and S2 normal. Heart sounds not distant. No murmur heard.    No systolic murmur is present.     No diastolic murmur is present.     No friction rub. No gallop. No S3 or S4 sounds.  Pulmonary:     Effort: Pulmonary effort is normal. No tachypnea, accessory muscle usage or respiratory distress.     Breath sounds: No stridor. Rhonchi present. No decreased breath sounds, wheezing or rales.  Chest:     Chest wall: No tenderness.  Abdominal:     General: Abdomen is flat. Bowel sounds are normal. There is no distension.     Palpations: Abdomen is soft. Abdomen is not rigid.     Tenderness: There is no abdominal tenderness. There is no guarding or rebound.   Musculoskeletal:        General: Normal range of motion.     Cervical back: Normal range of motion and neck supple. No edema, erythema or rigidity. No muscular tenderness. Normal range of motion.  Lumbar back: Tenderness present.     Left lower leg: Edema present.     Comments: Very mild edema left lower extremity with cuffing at the site of the tight sock on his lower leg  Lymphadenopathy:     Head:     Right side of head: No submental or submandibular adenopathy.     Left side of head: No submental or submandibular adenopathy.     Cervical: No cervical adenopathy.  Skin:    General: Skin is warm and dry.     Coloration: Skin is not pale.     Findings: No rash.     Nails: There is no clubbing.  Neurological:     Mental Status: He is alert and oriented to person, place, and time.     Sensory: No sensory deficit.     Motor: Weakness present.     Gait: Gait abnormal.     Comments: Upper and Lower Extremity Right sided parlysis  Psychiatric:        Speech: Speech normal.        Behavior: Behavior normal.      No results found for any visits on 02/26/23.  Last CBC Lab Results  Component Value Date   WBC 9.1 05/08/2022   HGB 11.2 (L) 05/08/2022   HCT 33.6 (L) 05/08/2022   MCV 86 05/08/2022   MCH 28.6 05/08/2022   RDW 13.1 05/08/2022   PLT 316 05/08/2022   Last metabolic panel Lab Results  Component Value Date   GLUCOSE 72 11/13/2022   NA 140 11/13/2022   K 4.2 11/13/2022   CL 101 11/13/2022   CO2 25 11/13/2022   BUN 12 11/13/2022   CREATININE 0.82 11/13/2022   GFRNONAA 59 (L) 03/15/2022   CALCIUM 9.7 11/13/2022   PHOS 4.2 09/19/2020   PROT 7.1 05/08/2022   ALBUMIN 4.5 05/08/2022   LABGLOB 2.6 05/08/2022   AGRATIO 1.7 05/08/2022   BILITOT <0.2 05/08/2022   ALKPHOS 97 05/08/2022   AST 12 05/08/2022   ALT 12 05/08/2022   ANIONGAP 11 03/15/2022   Last lipids Lab Results  Component Value Date   CHOL 96 (L) 01/08/2022   HDL 35 (L) 01/08/2022   LDLCALC  45 01/08/2022   TRIG 75 01/08/2022   CHOLHDL 2.7 01/08/2022   Last hemoglobin A1c Lab Results  Component Value Date   HGBA1C 5.6 09/12/2020   Last thyroid functions Lab Results  Component Value Date   TSH 0.64 03/22/2016   Last vitamin D No results found for: "25OHVITD2", "25OHVITD3", "VD25OH" Last vitamin B12 and Folate No results found for: "VITAMINB12", "FOLATE"  The ASCVD Risk score (Arnett DK, et al., 2019) failed to calculate for the following reasons:   The patient has a prior MI or stroke diagnosis    Assessment & Plan:   Problem List Items Addressed This Visit   None No follow-ups on file.    Shan Levans, MD

## 2023-02-26 ENCOUNTER — Ambulatory Visit: Payer: Medicaid Other | Attending: Critical Care Medicine | Admitting: Critical Care Medicine

## 2023-02-26 ENCOUNTER — Encounter: Payer: Self-pay | Admitting: Critical Care Medicine

## 2023-02-26 ENCOUNTER — Other Ambulatory Visit: Payer: Self-pay

## 2023-02-26 ENCOUNTER — Telehealth: Payer: Self-pay

## 2023-02-26 ENCOUNTER — Other Ambulatory Visit (HOSPITAL_COMMUNITY): Payer: Self-pay

## 2023-02-26 VITALS — BP 126/88 | HR 63

## 2023-02-26 DIAGNOSIS — I619 Nontraumatic intracerebral hemorrhage, unspecified: Secondary | ICD-10-CM

## 2023-02-26 DIAGNOSIS — J209 Acute bronchitis, unspecified: Secondary | ICD-10-CM | POA: Diagnosis not present

## 2023-02-26 DIAGNOSIS — H6123 Impacted cerumen, bilateral: Secondary | ICD-10-CM | POA: Diagnosis not present

## 2023-02-26 DIAGNOSIS — I10 Essential (primary) hypertension: Secondary | ICD-10-CM | POA: Diagnosis not present

## 2023-02-26 DIAGNOSIS — I6930 Unspecified sequelae of cerebral infarction: Secondary | ICD-10-CM

## 2023-02-26 DIAGNOSIS — K219 Gastro-esophageal reflux disease without esophagitis: Secondary | ICD-10-CM

## 2023-02-26 DIAGNOSIS — B182 Chronic viral hepatitis C: Secondary | ICD-10-CM | POA: Diagnosis not present

## 2023-02-26 MED ORDER — HYDROXYZINE PAMOATE 50 MG PO CAPS
50.0000 mg | ORAL_CAPSULE | Freq: Three times a day (TID) | ORAL | 1 refills | Status: DC | PRN
  Filled 2023-02-26: qty 270, 90d supply, fill #0
  Filled 2023-03-21: qty 90, 30d supply, fill #0
  Filled 2023-04-16: qty 90, 30d supply, fill #1
  Filled 2023-05-21: qty 90, 30d supply, fill #2
  Filled 2023-06-20: qty 90, 30d supply, fill #3
  Filled 2023-07-19: qty 90, 30d supply, fill #4
  Filled 2023-08-19: qty 90, 30d supply, fill #5

## 2023-02-26 MED ORDER — PANTOPRAZOLE SODIUM 40 MG PO TBEC
40.0000 mg | DELAYED_RELEASE_TABLET | Freq: Every day | ORAL | 2 refills | Status: DC
  Filled 2023-02-26: qty 60, 60d supply, fill #0
  Filled 2023-04-16: qty 30, 30d supply, fill #0
  Filled 2023-06-21: qty 30, 30d supply, fill #1
  Filled 2023-07-19: qty 30, 30d supply, fill #2
  Filled 2023-08-19: qty 30, 30d supply, fill #3
  Filled 2023-09-19: qty 30, 30d supply, fill #4
  Filled 2023-10-14: qty 30, 30d supply, fill #5

## 2023-02-26 MED ORDER — TRAZODONE HCL 100 MG PO TABS
100.0000 mg | ORAL_TABLET | Freq: Every evening | ORAL | 2 refills | Status: DC | PRN
  Filled 2023-02-26 – 2023-04-16 (×2): qty 60, 60d supply, fill #0
  Filled 2023-06-10: qty 60, 60d supply, fill #1
  Filled 2023-08-12 – 2023-08-19 (×2): qty 60, 60d supply, fill #2

## 2023-02-26 MED ORDER — ONDANSETRON HCL 4 MG PO TABS
4.0000 mg | ORAL_TABLET | Freq: Three times a day (TID) | ORAL | 1 refills | Status: DC | PRN
  Filled 2023-02-26: qty 20, 7d supply, fill #0

## 2023-02-26 MED ORDER — AMLODIPINE BESYLATE 10 MG PO TABS
10.0000 mg | ORAL_TABLET | Freq: Every day | ORAL | 1 refills | Status: DC
  Filled 2023-02-26 – 2023-06-10 (×2): qty 90, 90d supply, fill #0
  Filled 2023-08-19: qty 90, 90d supply, fill #1

## 2023-02-26 MED ORDER — LABETALOL HCL 300 MG PO TABS
300.0000 mg | ORAL_TABLET | Freq: Two times a day (BID) | ORAL | 2 refills | Status: DC
Start: 2023-02-26 — End: 2023-12-16
  Filled 2023-02-26 – 2023-03-13 (×2): qty 180, 90d supply, fill #0
  Filled 2023-06-21: qty 60, 30d supply, fill #1
  Filled 2023-07-19: qty 60, 30d supply, fill #2
  Filled 2023-08-16: qty 60, 30d supply, fill #3
  Filled 2023-09-19: qty 60, 30d supply, fill #4
  Filled 2023-10-14: qty 60, 30d supply, fill #5
  Filled 2023-11-11: qty 60, 30d supply, fill #6

## 2023-02-26 MED ORDER — HYDRALAZINE HCL 50 MG PO TABS
50.0000 mg | ORAL_TABLET | Freq: Two times a day (BID) | ORAL | 1 refills | Status: DC
  Filled 2023-02-26 – 2023-05-30 (×2): qty 180, 90d supply, fill #0
  Filled 2023-08-19: qty 180, 90d supply, fill #1

## 2023-02-26 MED ORDER — ATORVASTATIN CALCIUM 20 MG PO TABS
20.0000 mg | ORAL_TABLET | Freq: Every day | ORAL | 1 refills | Status: DC
  Filled 2023-02-26 – 2023-05-30 (×2): qty 90, 90d supply, fill #0
  Filled 2023-08-19: qty 90, 90d supply, fill #1

## 2023-02-26 MED ORDER — BACLOFEN 20 MG PO TABS
20.0000 mg | ORAL_TABLET | Freq: Three times a day (TID) | ORAL | 0 refills | Status: DC
  Filled 2023-02-26: qty 270, 90d supply, fill #0
  Filled 2023-03-21: qty 90, 30d supply, fill #0
  Filled 2023-04-16: qty 90, 30d supply, fill #1
  Filled 2023-05-21: qty 90, 30d supply, fill #2

## 2023-02-26 MED ORDER — PREGABALIN 150 MG PO CAPS
150.0000 mg | ORAL_CAPSULE | Freq: Two times a day (BID) | ORAL | 4 refills | Status: DC
  Filled 2023-02-26 – 2023-03-21 (×2): qty 60, 30d supply, fill #0
  Filled 2023-04-16: qty 60, 30d supply, fill #1
  Filled 2023-05-13: qty 60, 30d supply, fill #2
  Filled 2023-06-11: qty 60, 30d supply, fill #3
  Filled 2023-07-12: qty 60, 30d supply, fill #4

## 2023-02-26 NOTE — Assessment & Plan Note (Signed)
Stable

## 2023-02-26 NOTE — Assessment & Plan Note (Signed)
Continue PPI therapy. 

## 2023-02-26 NOTE — Assessment & Plan Note (Signed)
Resolved

## 2023-02-26 NOTE — Assessment & Plan Note (Signed)
Blood pressure in good control no change in medication

## 2023-02-26 NOTE — Patient Instructions (Signed)
REfills on medications sent to pharmacy  no changes Referral to homebound provider service will be made Return to Dr [name] as needed if service does not work out

## 2023-02-26 NOTE — Telephone Encounter (Addendum)
I met with the patient and his wife when they were in the clinic today.  They are interested in a referral to [facility] for PCP due to the difficulty that the patient has getting out of the house.  I sent a message to [facility] inquiring if they are in network with patient's insurance. I told the patient that I would be in contact with them after I hear back from [facility].  They understand that if [facility] is not able to see him, he will continue to see Dr [name] as PCP.   The patient is in need of a new wheelchair but has been informed that his insurance will not cover a new wheelchair at this time  I provided his wife with contact information for the [employer] company that offers donated DME at no cost to individuals   I also offered to make a referral to Managed Medicaid Care Management and they were in agreement, so I placed that referral.   Message received from [facility], [facility] stating they don't accept patient's insurance.

## 2023-02-26 NOTE — Telephone Encounter (Signed)
I called [facility] and spoke to [facility] to follow up on the wheelchair order that was placed 11/2022.  She said that his  insurance will not cover a new wheelchair because their records indicated that the patient received a new wheelchair 06/14/2022. Wende Crease stated that a new wheelchair for the patient would be $116.02 that includes the elevated leg rests and anti-tippers.   I called patient's wife, [name] and explained my conversation with [facility] to her.  She said that they never received a wheelchair from [facility] and she would call [facility] to discuss further.  I also informed her that [facility] is not in network with her husband's insurance and U scheduled him with Dr [name] 06/11/2023.

## 2023-02-27 ENCOUNTER — Telehealth: Payer: Self-pay

## 2023-02-27 DIAGNOSIS — I169 Hypertensive crisis, unspecified: Secondary | ICD-10-CM | POA: Diagnosis not present

## 2023-02-27 LAB — CBC WITH DIFFERENTIAL/PLATELET
Basophils Absolute: 0.1 10*3/uL (ref 0.0–0.2)
Basos: 1 %
EOS (ABSOLUTE): 0.2 10*3/uL (ref 0.0–0.4)
Eos: 3 %
Hematocrit: 38.3 % (ref 37.5–51.0)
Hemoglobin: 12.4 g/dL — ABNORMAL LOW (ref 13.0–17.7)
Immature Grans (Abs): 0 10*3/uL (ref 0.0–0.1)
Immature Granulocytes: 1 %
Lymphocytes Absolute: 2.2 10*3/uL (ref 0.7–3.1)
Lymphs: 27 %
MCH: 27 pg (ref 26.6–33.0)
MCHC: 32.4 g/dL (ref 31.5–35.7)
MCV: 83 fL (ref 79–97)
Monocytes Absolute: 0.5 10*3/uL (ref 0.1–0.9)
Monocytes: 6 %
Neutrophils Absolute: 5.1 10*3/uL (ref 1.4–7.0)
Neutrophils: 62 %
Platelets: 342 10*3/uL (ref 150–450)
RBC: 4.6 x10E6/uL (ref 4.14–5.80)
RDW: 15.6 % — ABNORMAL HIGH (ref 11.6–15.4)
WBC: 8.1 10*3/uL (ref 3.4–10.8)

## 2023-02-27 LAB — LIPID PANEL
Chol/HDL Ratio: 3 ratio (ref 0.0–5.0)
Cholesterol, Total: 84 mg/dL — ABNORMAL LOW (ref 100–199)
HDL: 28 mg/dL — ABNORMAL LOW (ref >39)
LDL Chol Calc (NIH): 40 mg/dL (ref 0–99)
Triglycerides: 78 mg/dL (ref 0–149)
VLDL Cholesterol Cal: 16 mg/dL (ref 5–40)

## 2023-02-27 LAB — COMPREHENSIVE METABOLIC PANEL
ALT: 15 IU/L (ref 0–44)
AST: 13 IU/L (ref 0–40)
Albumin/Globulin Ratio: 1.5 (ref 1.2–2.2)
Albumin: 4.2 g/dL (ref 3.8–4.9)
Alkaline Phosphatase: 96 IU/L (ref 44–121)
BUN/Creatinine Ratio: 15 (ref 9–20)
BUN: 13 mg/dL (ref 6–24)
Bilirubin Total: 0.3 mg/dL (ref 0.0–1.2)
CO2: 22 mmol/L (ref 20–29)
Calcium: 9.2 mg/dL (ref 8.7–10.2)
Chloride: 105 mmol/L (ref 96–106)
Creatinine, Ser: 0.86 mg/dL (ref 0.76–1.27)
Globulin, Total: 2.8 g/dL (ref 1.5–4.5)
Glucose: 70 mg/dL (ref 70–99)
Potassium: 4.2 mmol/L (ref 3.5–5.2)
Sodium: 142 mmol/L (ref 134–144)
Total Protein: 7 g/dL (ref 6.0–8.5)
eGFR: 104 mL/min/{1.73_m2} (ref >59)

## 2023-02-27 NOTE — Telephone Encounter (Signed)
-----   Message from Storm Frisk, MD sent at 02/27/2023  5:45 AM EDT ----- Let pt know cholesterol is at goal, blood count normal, kidney liver normal

## 2023-02-27 NOTE — Telephone Encounter (Signed)
Pt was called and vm was left, Information has been sent to nurse pool.   

## 2023-02-27 NOTE — Progress Notes (Signed)
Let pt know cholesterol is at goal, blood count normal, kidney liver normal

## 2023-02-28 DIAGNOSIS — I169 Hypertensive crisis, unspecified: Secondary | ICD-10-CM | POA: Diagnosis not present

## 2023-03-01 DIAGNOSIS — I169 Hypertensive crisis, unspecified: Secondary | ICD-10-CM | POA: Diagnosis not present

## 2023-03-04 DIAGNOSIS — I169 Hypertensive crisis, unspecified: Secondary | ICD-10-CM | POA: Diagnosis not present

## 2023-03-05 ENCOUNTER — Other Ambulatory Visit: Payer: Medicaid Other

## 2023-03-05 DIAGNOSIS — I169 Hypertensive crisis, unspecified: Secondary | ICD-10-CM | POA: Diagnosis not present

## 2023-03-05 NOTE — Patient Instructions (Signed)
Visit Information  Jason Romero was given information about Medicaid Managed Care team care coordination services as a part of their [facility] Community Plan Medicaid benefit. Jason Romero verbally consented to engagement with the [facility] Managed Care team.   If you are experiencing a medical emergency, please call 911 or report to your local emergency department or urgent care.   If you have a non-emergency medical problem during routine business hours, please contact your provider's office and ask to speak with a nurse.   For questions related to your [facility], please call: [phone] or visit the homepage here: [email or web address]  If you would like to schedule transportation through your [facility], please call the following number at least 2 days in advance of your appointment: [phone]   Rides for urgent appointments can also be made after hours by calling Member Services.  Call the Behavioral Health Crisis Line at [phone], at any time, 24 hours a day, 7 days a week. If you are in danger or need immediate medical attention call 911.  If you would like help to quit smoking, call 1-800-QUIT-NOW (954-583-0882) OR Espaol: 1-855-Djelo-Ya (1-324-401-0272) o para ms informacin haga clic aqu or Text READY to 536-644 to register via text  Mr. Barrick - following are the goals we discussed in your visit today:   Goals Addressed   None      Social Worker will follow up in 30 days.  Gus Puma, Kenard Gower, MHA [facility]  Managed Medicaid Social Worker [phone]   Following is a copy of your plan of care:  There are no care plans that you recently modified to display for this patient.

## 2023-03-05 NOTE — Patient Outreach (Signed)
Medicaid Managed Care Social Work Note  03/05/2023 Name:  Jason Romero MRN:  [id number] DOB:  1971/02/28  Jason Romero is an 52 y.o. year old male who is a primary patient of [name] [name], MD.  The Medicaid Managed Care Coordination team was consulted for assistance with:  Community Resources   Jason Romero was given information about Medicaid Managed Care Coordination team services today. Jason Romero Primary Caregiver agreed to services and verbal consent obtained.  Engaged with patient  for by telephone forinitial visit in response to referral for case management and/or care coordination services.   Assessments/Interventions:  Review of past medical history, allergies, medications, health status, including review of consultants reports, laboratory and other test data, was performed as part of comprehensive evaluation and provision of chronic care management services.  SDOH: (Social Determinant of Health) assessments and interventions performed: SDOH Interventions    Flowsheet Row Telephone from 11/08/2021 in [facility] Office Visit from 02/13/2021 in [facility] Office Visit from 03/22/2016 in [facility]  SDOH Interventions     Depression Interventions/Treatment  -- Medication Currently on Treatment  Financial Strain Interventions [id number] Referral  [Submitted referral to Independent Living/Vocational Rehab for wheelchair ramp] -- --     Jason Romero completed a telephone outreach with patient and wife. Wife states they live with patients mother and she pays the rent, while they assist with the utilities. Patient receives 608 disability each month, wife does not have any income due to caring for patient. Wife is interested in the CAP program, she states someone was supposed to send her the paperwork but she never received. They do receive foodstamps and do not have  transportation. Jason Romero will send patient information on utilities and extra benefits with [employer].   Advanced Directives Status:  Not addressed in this encounter.  Care Plan                 Allergies  Allergen Reactions   Hydrocodone Hives   Ibuprofen Other (See Comments)    Pt can't take this medication because it interacts with other medications that he is taking.     Medications Reviewed Today     Reviewed by Jason Frisk, MD (Physician) on 02/26/23 at 1716  Med List Status: <None>   Medication Order Taking? Sig Documenting Provider Last Dose Status Informant  acetaminophen (TYLENOL) 325 MG tablet [id number] Yes Take 2 tablets (650 mg total) by mouth every 4 (four) hours as needed for mild pain (or temp > 37.5 C (99.5 F)). Jason Amor, PA-C Taking Active   amLODipine (NORVASC) 10 MG tablet [id number]  Take 1 tablet (10 mg total) by mouth daily. Jason Frisk, MD  Active   aspirin EC 81 MG tablet [id number] Yes Take 1 tablet (81 mg total) by mouth daily. Jason Romero. Jason Frisk, MD Taking Active   atorvastatin (LIPITOR) 20 MG tablet [id number]  Take 1 tablet (20 mg total) by mouth daily. Jason Frisk, MD  Active   baclofen (LIORESAL) 20 MG tablet [id number]  Take 1 tablet (20 mg total) by mouth 3 (three) times daily. Jason Frisk, MD  Active   Blood Pressure Monitoring (BLOOD PRESSURE KIT) DEVI [id number] Yes Use to monitor blood pressure Jason Frisk, MD Taking Active   hydrALAZINE (APRESOLINE) 50 MG tablet [id number]  Take 1 tablet by mouth in the morning and at bedtime. Jason Frisk,  MD  Active   hydrOXYzine (VISTARIL) 50 MG capsule [id number]  Take 1 capsule (50 mg total) by mouth every 8 (eight) hours as needed for anxiety. Jason Frisk, MD  Active   labetalol (NORMODYNE) 300 MG tablet [id number]  Take 1 tablet (300 mg total) by mouth 2 (two) times daily. Jason Frisk, MD  Active   ondansetron [facility]) 4 MG tablet [id number]  Take 1 tablet (4 mg  total) by mouth every 8 (eight) hours as needed for nausea or vomiting. Jason Frisk, MD  Active   pantoprazole (PROTONIX) 40 MG tablet [id number]  Take 1 tablet (40 mg total) by mouth daily. Jason Frisk, MD  Active   pregabalin (LYRICA) 150 MG capsule [id number]  Take 1 capsule (150 mg total) by mouth 2 (two) times daily. Jason Frisk, MD  Active   senna (SENOKOT) 8.6 MG TABS tablet [id number] Yes Take 1 tablet (8.6 mg total) by mouth 2 (two) times daily. Jason Frisk, MD Taking Active   traZODone (DESYREL) 100 MG tablet [id number]  Take 1 tablet (100 mg total) by mouth at bedtime as needed for sleep. Jason Frisk, MD  Active             Patient Active Problem List   Diagnosis Date Noted   Chronic hepatitis C without hepatic coma 02/28/2021   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) 02/14/2021   Hyperlipidemia 02/14/2021   Visual changes 02/14/2021   Obesity 02/12/2021   Back pain 02/12/2021   Depression 01/26/2021   Spastic hemiplegia affecting nondominant side    Dysphagia due to recent stroke 09/20/2020   History of intracranial hemorrhage 09/20/2020   History of stroke with residual deficit 09/11/2020   Sleep apnea 09/06/2016   Tobacco abuse 09/06/2016   Unintentional weight loss 03/22/2016   Essential hypertension 02/12/2016   Anxiety 02/12/2016   Migraines 02/12/2016   COPD (chronic obstructive pulmonary disease) 02/01/2016   Gastroesophageal reflux disease without esophagitis 02/01/2016    Conditions to be addressed/monitored per PCP order:   community resources  There are no care plans that you recently modified to display for this patient.   Follow up:  Patient agrees to Care Plan and Follow-up.  Plan: The Managed Medicaid care management team will reach out to the patient again over the next 30 days.  Date/time of next scheduled Social Work care management/care coordination outreach:  04/04/23 Jason Romero,  Jason Romero, [facility]  Managed [facility] Social Worker [phone]

## 2023-03-06 DIAGNOSIS — I169 Hypertensive crisis, unspecified: Secondary | ICD-10-CM | POA: Diagnosis not present

## 2023-03-07 DIAGNOSIS — I169 Hypertensive crisis, unspecified: Secondary | ICD-10-CM | POA: Diagnosis not present

## 2023-03-08 DIAGNOSIS — I169 Hypertensive crisis, unspecified: Secondary | ICD-10-CM | POA: Diagnosis not present

## 2023-03-11 DIAGNOSIS — I169 Hypertensive crisis, unspecified: Secondary | ICD-10-CM | POA: Diagnosis not present

## 2023-03-12 DIAGNOSIS — I169 Hypertensive crisis, unspecified: Secondary | ICD-10-CM | POA: Diagnosis not present

## 2023-03-13 ENCOUNTER — Other Ambulatory Visit: Payer: Self-pay

## 2023-03-13 ENCOUNTER — Other Ambulatory Visit (HOSPITAL_COMMUNITY): Payer: Self-pay

## 2023-03-13 DIAGNOSIS — I169 Hypertensive crisis, unspecified: Secondary | ICD-10-CM | POA: Diagnosis not present

## 2023-03-14 DIAGNOSIS — H0288B Meibomian gland dysfunction left eye, upper and lower eyelids: Secondary | ICD-10-CM | POA: Diagnosis not present

## 2023-03-14 DIAGNOSIS — H0288A Meibomian gland dysfunction right eye, upper and lower eyelids: Secondary | ICD-10-CM | POA: Diagnosis not present

## 2023-03-14 DIAGNOSIS — H04123 Dry eye syndrome of bilateral lacrimal glands: Secondary | ICD-10-CM | POA: Diagnosis not present

## 2023-03-14 DIAGNOSIS — H2513 Age-related nuclear cataract, bilateral: Secondary | ICD-10-CM | POA: Diagnosis not present

## 2023-03-14 DIAGNOSIS — H0102B Squamous blepharitis left eye, upper and lower eyelids: Secondary | ICD-10-CM | POA: Diagnosis not present

## 2023-03-14 DIAGNOSIS — H0102A Squamous blepharitis right eye, upper and lower eyelids: Secondary | ICD-10-CM | POA: Diagnosis not present

## 2023-03-14 DIAGNOSIS — H35033 Hypertensive retinopathy, bilateral: Secondary | ICD-10-CM | POA: Diagnosis not present

## 2023-03-18 DIAGNOSIS — I169 Hypertensive crisis, unspecified: Secondary | ICD-10-CM | POA: Diagnosis not present

## 2023-03-19 DIAGNOSIS — I169 Hypertensive crisis, unspecified: Secondary | ICD-10-CM | POA: Diagnosis not present

## 2023-03-20 DIAGNOSIS — I169 Hypertensive crisis, unspecified: Secondary | ICD-10-CM | POA: Diagnosis not present

## 2023-03-21 ENCOUNTER — Other Ambulatory Visit: Payer: Self-pay

## 2023-03-21 ENCOUNTER — Other Ambulatory Visit (HOSPITAL_COMMUNITY): Payer: Self-pay

## 2023-03-21 DIAGNOSIS — I169 Hypertensive crisis, unspecified: Secondary | ICD-10-CM | POA: Diagnosis not present

## 2023-03-22 DIAGNOSIS — I169 Hypertensive crisis, unspecified: Secondary | ICD-10-CM | POA: Diagnosis not present

## 2023-03-26 DIAGNOSIS — I169 Hypertensive crisis, unspecified: Secondary | ICD-10-CM | POA: Diagnosis not present

## 2023-03-27 DIAGNOSIS — I169 Hypertensive crisis, unspecified: Secondary | ICD-10-CM | POA: Diagnosis not present

## 2023-03-28 DIAGNOSIS — I169 Hypertensive crisis, unspecified: Secondary | ICD-10-CM | POA: Diagnosis not present

## 2023-03-29 DIAGNOSIS — I169 Hypertensive crisis, unspecified: Secondary | ICD-10-CM | POA: Diagnosis not present

## 2023-04-01 DIAGNOSIS — I169 Hypertensive crisis, unspecified: Secondary | ICD-10-CM | POA: Diagnosis not present

## 2023-04-02 DIAGNOSIS — I169 Hypertensive crisis, unspecified: Secondary | ICD-10-CM | POA: Diagnosis not present

## 2023-04-02 DIAGNOSIS — R8290 Unspecified abnormal findings in urine: Secondary | ICD-10-CM | POA: Diagnosis not present

## 2023-04-02 DIAGNOSIS — R32 Unspecified urinary incontinence: Secondary | ICD-10-CM | POA: Diagnosis not present

## 2023-04-02 DIAGNOSIS — G8191 Hemiplegia, unspecified affecting right dominant side: Secondary | ICD-10-CM | POA: Diagnosis not present

## 2023-04-03 DIAGNOSIS — I169 Hypertensive crisis, unspecified: Secondary | ICD-10-CM | POA: Diagnosis not present

## 2023-04-04 ENCOUNTER — Other Ambulatory Visit: Payer: Medicaid Other

## 2023-04-04 NOTE — Patient Outreach (Signed)
Medicaid Managed Care Social Work Note  04/04/2023 Name:  Jason Romero MRN:  [id number] DOB:  Nov 17, 1970  Jason Romero is an 52 y.o. year old male who is a primary patient of Jason Romero Jason Cradle, MD.  The [facility] Managed Care Coordination team was consulted for assistance with:  Level of Care Concerns  [name] was given information about Medicaid Managed Care Coordination team services today. Jason Romero Patient agreed to services and verbal consent obtained.  Engaged with patient  for by telephone forfollow up visit in response to referral for case management and/or care coordination services.   Assessments/Interventions:  Review of past medical history, allergies, medications, health status, including review of consultants reports, laboratory and other test data, was performed as part of comprehensive evaluation and provision of chronic care management services.  SDOH: (Social Determinant of Health) assessments and interventions performed: SDOH Interventions    Flowsheet Row Telephone from 11/08/2021 in [facility] Office Visit from 02/13/2021 in [facility] Office Visit from 03/22/2016 in Jason Romero [facility] Jason Romero  SDOH Interventions     Depression Interventions/Treatment  -- Medication Currently on Treatment  Financial Strain Interventions [id number] Referral  [Submitted referral to Independent Living/Vocational Rehab for wheelchair ramp] -- --      BSW completed a telephone outreach with patients wife, She states she has not receieved any information from CAP. BSW contacted [facility] and spoke with Erskine Squibb, a new referral was submitted for CAP services. Patient and wife will receive information in a few days and have 10 days to return the packet. Advanced Directives Status:  Not addressed in this encounter.  Care Plan                 Allergies  Allergen Reactions   Hydrocodone Hives    Ibuprofen Other (See Comments)    Pt can't take this medication because it interacts with other medications that he is taking.     Medications Reviewed Today     Reviewed by Jason Frisk, MD (Physician) on 02/26/23 at 1716  Med List Status: <None>   Medication Order Taking? Sig Documenting Provider Last Dose Status Informant  acetaminophen (TYLENOL) 325 MG tablet [id number] Yes Take 2 tablets (650 mg total) by mouth every 4 (four) hours as needed for mild pain (or temp > 37.5 C (99.5 F)). Jason Amor, PA-C Taking Active   amLODipine (NORVASC) 10 MG tablet [id number]  Take 1 tablet (10 mg total) by mouth daily. Jason Frisk, MD  Active   aspirin EC 81 MG tablet [id number] Yes Take 1 tablet (81 mg total) by mouth daily. Swallow whole. Jason Frisk, MD Taking Active   atorvastatin (LIPITOR) 20 MG tablet [id number]  Take 1 tablet (20 mg total) by mouth daily. Jason Frisk, MD  Active   baclofen (LIORESAL) 20 MG tablet [id number]  Take 1 tablet (20 mg total) by mouth 3 (three) times daily. Jason Frisk, MD  Active   Blood Pressure Monitoring (BLOOD PRESSURE KIT) DEVI [id number] Yes Use to monitor blood pressure Jason Frisk, MD Taking Active   hydrALAZINE (APRESOLINE) 50 MG tablet [id number]  Take 1 tablet by mouth in the morning and at bedtime. Jason Frisk, MD  Active   hydrOXYzine (VISTARIL) 50 MG capsule [id number]  Take 1 capsule (50 mg total) by mouth every 8 (eight) hours as needed for anxiety. Jason Frisk, MD  Active  labetalol (NORMODYNE) 300 MG tablet [id number]  Take 1 tablet (300 mg total) by mouth 2 (two) times daily. Jason Frisk, MD  Active   ondansetron Jason Romero) 4 MG tablet [id number]  Take 1 tablet (4 mg total) by mouth every 8 (eight) hours as needed for nausea or vomiting. Jason Frisk, MD  Active   pantoprazole (PROTONIX) 40 MG tablet [id number]  Take 1 tablet (40 mg total) by mouth daily. Jason Frisk, MD  Active   pregabalin  (LYRICA) 150 MG capsule [id number]  Take 1 capsule (150 mg total) by mouth 2 (two) times daily. Jason Frisk, MD  Active   senna (SENOKOT) 8.6 MG TABS tablet [id number] Yes Take 1 tablet (8.6 mg total) by mouth 2 (two) times daily. Jason Frisk, MD Taking Active   traZODone (DESYREL) 100 MG tablet [id number]  Take 1 tablet (100 mg total) by mouth at bedtime as needed for sleep. 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) 02/14/2021   Hyperlipidemia 02/14/2021   Visual changes 02/14/2021   Obesity 02/12/2021   Back pain 02/12/2021   Depression 01/26/2021   Spastic hemiplegia affecting nondominant side (HCC)    Dysphagia due to recent stroke 09/20/2020   History of intracranial hemorrhage 09/20/2020   History of stroke with residual deficit 09/11/2020   Sleep apnea 09/06/2016   Tobacco abuse 09/06/2016   Unintentional weight loss 03/22/2016   Essential hypertension 02/12/2016   Anxiety 02/12/2016   Migraines 02/12/2016   COPD (chronic obstructive pulmonary disease) (HCC) 02/01/2016   Gastroesophageal reflux disease without esophagitis 02/01/2016    Conditions to be addressed/monitored per PCP order:   level of care concerns  There are no care plans that you recently modified to display for this patient.   Follow up:  Patient agrees to Care Plan and Follow-up.  Plan: The Managed Medicaid care management team will reach out to the patient again over the next 30 days.  Date/time of next scheduled Social Work care management/care coordination outreach:  05/06/23  Gus Puma, Kenard Gower, Jason Romero [facility]  Managed Jason Romero Social Worker [phone]

## 2023-04-04 NOTE — Patient Instructions (Signed)
Visit Information  Mr. Sheils was given information about Medicaid Managed Care team care coordination services as a part of their [facility] Community Plan Medicaid benefit. Cuyler Winberry Masek verbally consented to engagement with the [facility] Managed Care team.   If you are experiencing a medical emergency, please call 911 or report to your local emergency department or urgent care.   If you have a non-emergency medical problem during routine business hours, please contact your provider's office and ask to speak with a nurse.   For questions related to your [facility], please call: [phone] or visit the homepage here: [email or web address]  If you would like to schedule transportation through your [facility], please call the following number at least 2 days in advance of your appointment: [phone]   Rides for urgent appointments can also be made after hours by calling Member Services.  Call the Behavioral Health Crisis Line at [phone], at any time, 24 hours a day, 7 days a week. If you are in danger or need immediate medical attention call 911.  If you would like help to quit smoking, call 1-800-QUIT-NOW (726-660-7954) OR Espaol: 1-855-Djelo-Ya (1-324-401-0272) o para ms informacin haga clic aqu or Text READY to 536-644 to register via text  Mr. Dibert - following are the goals we discussed in your visit today:   Goals Addressed   None      Social Worker will follow up on 05/06/23.  Gus Puma, Kenard Gower, MHA [facility]  Managed Medicaid Social Worker [phone]   Following is a copy of your plan of care:  There are no care plans that you recently modified to display for this patient.

## 2023-04-10 DIAGNOSIS — I169 Hypertensive crisis, unspecified: Secondary | ICD-10-CM | POA: Diagnosis not present

## 2023-04-11 DIAGNOSIS — I169 Hypertensive crisis, unspecified: Secondary | ICD-10-CM | POA: Diagnosis not present

## 2023-04-12 DIAGNOSIS — I169 Hypertensive crisis, unspecified: Secondary | ICD-10-CM | POA: Diagnosis not present

## 2023-04-16 ENCOUNTER — Other Ambulatory Visit: Payer: Self-pay

## 2023-04-16 ENCOUNTER — Other Ambulatory Visit (HOSPITAL_COMMUNITY): Payer: Self-pay

## 2023-04-16 DIAGNOSIS — I169 Hypertensive crisis, unspecified: Secondary | ICD-10-CM | POA: Diagnosis not present

## 2023-04-17 DIAGNOSIS — I169 Hypertensive crisis, unspecified: Secondary | ICD-10-CM | POA: Diagnosis not present

## 2023-04-18 ENCOUNTER — Other Ambulatory Visit: Payer: Self-pay

## 2023-04-19 ENCOUNTER — Other Ambulatory Visit (HOSPITAL_COMMUNITY): Payer: Self-pay

## 2023-04-19 DIAGNOSIS — I169 Hypertensive crisis, unspecified: Secondary | ICD-10-CM | POA: Diagnosis not present

## 2023-04-22 DIAGNOSIS — I169 Hypertensive crisis, unspecified: Secondary | ICD-10-CM | POA: Diagnosis not present

## 2023-04-23 DIAGNOSIS — I169 Hypertensive crisis, unspecified: Secondary | ICD-10-CM | POA: Diagnosis not present

## 2023-04-24 DIAGNOSIS — I169 Hypertensive crisis, unspecified: Secondary | ICD-10-CM | POA: Diagnosis not present

## 2023-04-25 DIAGNOSIS — I169 Hypertensive crisis, unspecified: Secondary | ICD-10-CM | POA: Diagnosis not present

## 2023-04-29 DIAGNOSIS — I169 Hypertensive crisis, unspecified: Secondary | ICD-10-CM | POA: Diagnosis not present

## 2023-04-30 DIAGNOSIS — I169 Hypertensive crisis, unspecified: Secondary | ICD-10-CM | POA: Diagnosis not present

## 2023-05-01 DIAGNOSIS — I169 Hypertensive crisis, unspecified: Secondary | ICD-10-CM | POA: Diagnosis not present

## 2023-05-02 DIAGNOSIS — I169 Hypertensive crisis, unspecified: Secondary | ICD-10-CM | POA: Diagnosis not present

## 2023-05-03 DIAGNOSIS — I169 Hypertensive crisis, unspecified: Secondary | ICD-10-CM | POA: Diagnosis not present

## 2023-05-06 ENCOUNTER — Other Ambulatory Visit: Payer: Medicaid Other

## 2023-05-06 NOTE — Patient Instructions (Signed)
  Medicaid Managed Care   Unsuccessful Outreach Note  05/06/2023 Name: Jason Romero MRN: [id number] DOB: 08-06-1971  Referred by: Storm Frisk, MD Reason for referral : High Risk Managed Medicaid (MM social work unsuccessful telephone outreach )   An unsuccessful telephone outreach was attempted today. The patient was referred to the case management team for assistance with care management and care coordination.   Follow Up Plan: The care management team will reach out to the patient again over the next 30 days.   Abelino Derrick, MHA [facility]  Managed [facility] Social Worker [phone]

## 2023-05-06 NOTE — Patient Outreach (Signed)
  Medicaid Managed Care   Unsuccessful Outreach Note  05/06/2023 Name: Jason Romero MRN: [id number] DOB: 07/03/1971  Referred by: Wright, Patrick E, MD Reason for referral : High Risk Managed Medicaid (MM social work unsuccessful telephone outreach )   An unsuccessful telephone outreach was attempted today. The patient was referred to the case management team for assistance with care management and care coordination.   Follow Up Plan: The care management team will reach out to the patient again over the next 30 days.   Shahrukh Pasch, BSW, MHA [place]  Managed Medicaid Social Worker [phone]  

## 2023-05-13 ENCOUNTER — Other Ambulatory Visit (HOSPITAL_COMMUNITY): Payer: Self-pay

## 2023-05-13 ENCOUNTER — Other Ambulatory Visit: Payer: Self-pay

## 2023-05-14 ENCOUNTER — Other Ambulatory Visit: Payer: Self-pay

## 2023-05-14 ENCOUNTER — Other Ambulatory Visit (HOSPITAL_COMMUNITY): Payer: Self-pay

## 2023-05-21 ENCOUNTER — Other Ambulatory Visit (HOSPITAL_COMMUNITY): Payer: Self-pay

## 2023-05-21 ENCOUNTER — Other Ambulatory Visit: Payer: Self-pay

## 2023-05-27 ENCOUNTER — Other Ambulatory Visit: Payer: Self-pay

## 2023-05-30 ENCOUNTER — Other Ambulatory Visit (HOSPITAL_COMMUNITY): Payer: Self-pay

## 2023-05-30 ENCOUNTER — Other Ambulatory Visit: Payer: Self-pay

## 2023-06-10 ENCOUNTER — Encounter: Payer: Self-pay | Admitting: Pharmacist

## 2023-06-10 ENCOUNTER — Other Ambulatory Visit (HOSPITAL_COMMUNITY): Payer: Self-pay

## 2023-06-10 ENCOUNTER — Other Ambulatory Visit: Payer: Self-pay

## 2023-06-10 NOTE — Progress Notes (Deleted)
Established Patient Office Visit  Subjective   Patient ID: Jason Romero, male    DOB: 1971-01-25  Age: 52 y.o. MRN: [id number]  No chief complaint on file.   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 Accompanied by his wife.  7/5 Patients blood pressure today 99/65. Patient recently seen in May in the emergency for hypotension and pneumonia. He was given fluids and discharged home.Infection cleared. Patient states blood pressure at home has also been running low with similar readings. Patient is doing his best with eating and drinking but due to his reflux disease and stroke he has trouble at times.  His Gastroesophageal Reflux Disease continues to intermittently bother him. He experiences Nausea and vomiting after eating on occasion. Chronic low back pain is still an issue for him. Pain is worse in the morning and improves slightly throughout the day. Denies blood in stool, painful radiation, loss of bowel, urinary incontinence.   Patient due for Dental examination and cleaning. Referral given at prior visit and patient has not followed up yet. Continues to smoke 1 pack per day, recreational marijuana use, denies alcohol use.  7/26 Patient seen in return short-term follow-up.  This patient has had low blood pressure ranging in the 90/70 range in the home environment.  He comes in today for a blood pressure check.  We have had him hold his lisinopril since the last visit.  Patient denies any nausea vomiting chest pain or other symptom complex these  07/11/2022 Patient returns in follow-up on arrival blood pressure is much improved 116/82.  He has tolerated the adjustments in blood pressure medications.  He is still smoking however 10 cigarettes daily.  Does complain of some mild edema left lower extremity.   There are no other complaints.  He is accompanied with his wife who helps him with his care.  He was informed he cannot have a new wheelchair for another year based on Medicaid guidelines.  The current wheelchair is functional.  Patient does agree to and did receive a flu vaccine this visit.  11/13/22  This patient is seen in return follow-up as had a recent viral URI and now has increased cough productive of white to brown mucus without hemoptysis he states he is not having any increased shortness of breath or chest pain.  Cough is worse at night.  He continues to smoke 10 cigarettes daily.  Patient also has elevated blood pressure on arrival 139/94.  In the past we have tried to regulate medication strictly he has become hypotensive.  There are no other complaints He is wheelchair-bound and needs a new wheelchair.  He is accompanied by his spouse.  02/26/23 Patient is seen today face-to-face in follow-up he has been stable overall he had a diarrheal episode recently is now resolved he is smoking 10 cigarettes a day.  He does need follow-up labs.  Blood pressure today on arrival is good 126/88.  There are no other complaints.  He is yet to receive his wheelchair.  We have tried to look into home-based provider care but his insurance would not cover this.   Patient Active Problem List   Diagnosis Date Noted  . Chronic hepatitis C without hepatic coma (HCC) 02/28/2021  . 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 Hyperlipidemia 02/14/2021  . Visual  changes 02/14/2021  . Obesity 02/12/2021  . Back pain 02/12/2021  . Depression 01/26/2021  . Spastic hemiplegia affecting nondominant side (HCC)   . Dysphagia due to recent stroke 09/20/2020  . History of intracranial hemorrhage 09/20/2020  . History of stroke with residual deficit 09/11/2020  . Sleep apnea 09/06/2016  . Tobacco abuse 09/06/2016  . Unintentional weight loss 03/22/2016  . Essential hypertension  02/12/2016  . Anxiety 02/12/2016  . Migraines 02/12/2016  . COPD (chronic obstructive pulmonary disease) (HCC) 02/01/2016  . Gastroesophageal reflux disease without esophagitis 02/01/2016   Past Medical History:  Diagnosis Date  . A-fib (HCC)   . Bilateral impacted cerumen 05/08/2022  . Chronic headaches   . COPD (chronic obstructive pulmonary disease) (HCC)   . GERD (gastroesophageal reflux disease)   . History of blood transfusion   . Hypertension   . MI (myocardial infarction) (HCC)   . Murmur 02/12/2021  . Seizures (HCC)   . Stroke [facility])    Past Surgical History:  Procedure Laterality Date  . CHOLECYSTECTOMY     Social History   Tobacco Use  . Smoking status: Every Day    Current packs/day: 1.00    Types: Cigarettes  . Smokeless tobacco: Never  Vaping Use  . Vaping status: Never Used  Substance Use Topics  . Alcohol use: Not Currently    Alcohol/week: 0.0 standard drinks of alcohol  . Drug use: Yes    Types: Marijuana    Comment: OCC   Social History   Socioeconomic History  . Marital status: Married    Spouse name: Not on file  . Number of children: Not on file  . Years of education: Not on file  . Highest education level: Not on file  Occupational History  . Not on file  Tobacco Use  . Smoking status: Every Day    Current packs/day: 1.00    Types: Cigarettes  . Smokeless tobacco: Never  Vaping Use  . Vaping status: Never Used  Substance and Sexual Activity  . Alcohol use: Not Currently    Alcohol/week: 0.0 standard drinks of alcohol  . Drug use: Yes    Types: Marijuana    Comment: OCC  . Sexual activity: Not Currently  Other Topics Concern  . Not on file  Social History Narrative   Left handed   Social Determinants of Health   Financial Resource Strain: Medium Risk (11/08/2021)   Overall Financial Resource Strain (CARDIA)   . Difficulty of Paying Living Expenses: Somewhat hard  Food Insecurity: No Food Insecurity (03/05/2023)   Hunger  Vital Sign   . Worried About Programme researcher, broadcasting/film/video in the Last Year: Never true   . Ran Out of Food in the Last Year: Never true  Transportation Needs: Unmet Transportation Needs (03/05/2023)   PRAPARE - Transportation   . Lack of Transportation (Medical): Yes   . Lack of Transportation (Non-Medical): Yes  Physical Activity: Not on file  Stress: Not on file  Social Connections: Not on file  Intimate Partner Violence: Not At Risk (03/05/2023)   Humiliation, Afraid, Rape, and Kick questionnaire   . Fear of Current or Ex-Partner: No   . Emotionally Abused: No   . Physically Abused: No   . Sexually Abused: No   Family Status  Relation Name Status  . Other  (Not Specified)  . Other  (Not Specified)  . Other  (Not Specified)  . Mother  (Not Specified)  No partnership data on file   Family  History  Problem Relation Age of Onset  . Alcoholism Other   . Arthritis Other   . Breast cancer Other   . Heart disease Mother    Allergies  Allergen Reactions  . Hydrocodone Hives  . Ibuprofen Other (See Comments)    Pt can't take this medication because it interacts with other medications that he is taking.     Review of Systems  Constitutional: Negative.  Negative for chills, diaphoresis, fever, malaise/fatigue and weight loss.  HENT:  Positive for hearing loss. Negative for congestion, ear discharge, ear pain, nosebleeds, sore throat and tinnitus.   Eyes: Negative.  Negative for blurred vision, double vision, photophobia and discharge.  Respiratory:  Negative for cough, hemoptysis, sputum production, shortness of breath, wheezing and stridor.        No excess mucus  Cardiovascular:  Negative for chest pain, palpitations, orthopnea, claudication, leg swelling and PND.  Gastrointestinal:  Negative for abdominal pain, blood in stool, constipation, diarrhea, heartburn, melena, nausea and vomiting.  Genitourinary: Negative.  Negative for dysuria, flank pain, frequency, hematuria and urgency.   Musculoskeletal:  Negative for back pain, falls, joint pain, myalgias and neck pain.  Skin: Negative.  Negative for itching and rash.  Neurological:  Positive for focal weakness. Negative for dizziness, tingling, tremors, sensory change, speech change, seizures, loss of consciousness, weakness and headaches.  Endo/Heme/Allergies: Negative.  Negative for environmental allergies and polydipsia. Does not bruise/bleed easily.  Psychiatric/Behavioral: Negative.  Negative for depression, hallucinations, memory loss, substance abuse and suicidal ideas. The patient is not nervous/anxious and does not have insomnia.   All other systems reviewed and are negative.     Objective:     There were no vitals taken for this visit. BP Readings from Last 3 Encounters:  02/26/23 126/88  01/01/23 113/77  11/13/22 (!) 139/94   Wt Readings from Last 3 Encounters:  05/02/21 227 lb (103 kg)  10/19/20 227 lb 9.6 oz (103.2 kg)  09/20/20 240 lb 15.4 oz (109.3 kg)      Physical Exam Vitals reviewed.  Constitutional:      Appearance: Normal appearance. He is well-developed. He is not diaphoretic.  HENT:     Head: Normocephalic and atraumatic.     Right Ear: External ear normal. There is no impacted cerumen.     Left Ear: External ear normal. There is no impacted cerumen.     Nose: No nasal deformity, septal deviation, mucosal edema, congestion or rhinorrhea.     Right Sinus: No maxillary sinus tenderness or frontal sinus tenderness.     Left Sinus: No maxillary sinus tenderness or frontal sinus tenderness.     Mouth/Throat:     Mouth: Mucous membranes are moist.     Pharynx: Oropharynx is clear. No oropharyngeal exudate or posterior oropharyngeal erythema.     Comments: Poor dentition Eyes:     General: No scleral icterus.    Conjunctiva/sclera: Conjunctivae normal.     Pupils: Pupils are equal, round, and reactive to light.  Neck:     Thyroid: No thyromegaly.     Vascular: No carotid bruit or JVD.      Trachea: Trachea normal. No tracheal tenderness or tracheal deviation.  Cardiovascular:     Rate and Rhythm: Normal rate and regular rhythm.     Chest Wall: PMI is not displaced.     Pulses: Normal pulses. No decreased pulses.     Heart sounds: Normal heart sounds, S1 normal and S2 normal. Heart sounds not distant. No  murmur heard.    No systolic murmur is present.     No diastolic murmur is present.     No friction rub. No gallop. No S3 or S4 sounds.  Pulmonary:     Effort: Pulmonary effort is normal. No tachypnea, accessory muscle usage or respiratory distress.     Breath sounds: No stridor. No decreased breath sounds, wheezing, rhonchi or rales.  Chest:     Chest wall: No tenderness.  Abdominal:     General: Abdomen is flat. Bowel sounds are normal. There is no distension.     Palpations: Abdomen is soft. Abdomen is not rigid.     Tenderness: There is no abdominal tenderness. There is no guarding or rebound.  Musculoskeletal:        General: Normal range of motion.     Cervical back: Normal range of motion and neck supple. No edema, erythema or rigidity. No muscular tenderness. Normal range of motion.     Lumbar back: Tenderness present.     Left lower leg: No edema.     Comments: Very mild edema left lower extremity with cuffing at the site of the tight sock on his lower leg  Lymphadenopathy:     Head:     Right side of head: No submental or submandibular adenopathy.     Left side of head: No submental or submandibular adenopathy.     Cervical: No cervical adenopathy.  Skin:    General: Skin is warm and dry.     Coloration: Skin is not pale.     Findings: No rash.     Nails: There is no clubbing.  Neurological:     Mental Status: He is alert and oriented to person, place, and time. Mental status is at baseline.     Sensory: No sensory deficit.     Motor: Weakness present.     Gait: Gait abnormal.     Comments: Upper and Lower Extremity Right sided parlysis   Psychiatric:        Speech: Speech normal.        Behavior: Behavior normal.     No results found for any visits on 06/11/23.  Last CBC Lab Results  Component Value Date   WBC 8.1 02/26/2023   HGB 12.4 (L) 02/26/2023   HCT 38.3 02/26/2023   MCV 83 02/26/2023   MCH 27.0 02/26/2023   RDW 15.6 (H) 02/26/2023   PLT 342 02/26/2023   Last metabolic panel Lab Results  Component Value Date   GLUCOSE 70 02/26/2023   NA 142 02/26/2023   K 4.2 02/26/2023   CL 105 02/26/2023   CO2 22 02/26/2023   BUN 13 02/26/2023   CREATININE 0.86 02/26/2023   GFRNONAA 59 (L) 03/15/2022   CALCIUM 9.2 02/26/2023   PHOS 4.2 09/19/2020   PROT 7.0 02/26/2023   ALBUMIN 4.2 02/26/2023   LABGLOB 2.8 02/26/2023   AGRATIO 1.5 02/26/2023   BILITOT 0.3 02/26/2023   ALKPHOS 96 02/26/2023   AST 13 02/26/2023   ALT 15 02/26/2023   ANIONGAP 11 03/15/2022   Last lipids Lab Results  Component Value Date   CHOL 84 (L) 02/26/2023   HDL 28 (L) 02/26/2023   LDLCALC 40 02/26/2023   TRIG 78 02/26/2023   CHOLHDL 3.0 02/26/2023   Last hemoglobin A1c Lab Results  Component Value Date   HGBA1C 5.6 09/12/2020   Last thyroid functions Lab Results  Component Value Date   TSH 0.64 03/22/2016   Last vitamin D  No results found for: "25OHVITD2", "25OHVITD3", "VD25OH" Last vitamin B12 and Folate No results found for: "VITAMINB12", "FOLATE"  The ASCVD Risk score (Arnett DK, et al., 2019) failed to calculate for the following reasons:   The patient has a prior MI or stroke diagnosis    Assessment & Plan:   Problem List Items Addressed This Visit   None  No follow-ups on file.    Shan Levans, MD

## 2023-06-11 ENCOUNTER — Other Ambulatory Visit: Payer: Self-pay

## 2023-06-11 ENCOUNTER — Other Ambulatory Visit (HOSPITAL_COMMUNITY): Payer: Self-pay

## 2023-06-11 ENCOUNTER — Ambulatory Visit: Payer: Medicaid Other | Admitting: Critical Care Medicine

## 2023-06-12 ENCOUNTER — Other Ambulatory Visit (HOSPITAL_COMMUNITY): Payer: Self-pay

## 2023-06-20 ENCOUNTER — Other Ambulatory Visit: Payer: Self-pay | Admitting: Critical Care Medicine

## 2023-06-20 ENCOUNTER — Other Ambulatory Visit: Payer: Self-pay

## 2023-06-20 ENCOUNTER — Other Ambulatory Visit (HOSPITAL_COMMUNITY): Payer: Self-pay

## 2023-06-20 MED ORDER — BACLOFEN 20 MG PO TABS
20.0000 mg | ORAL_TABLET | Freq: Three times a day (TID) | ORAL | 0 refills | Status: DC
  Filled 2023-06-20: qty 90, 30d supply, fill #0
  Filled 2023-07-19: qty 90, 30d supply, fill #1
  Filled 2023-08-16: qty 90, 30d supply, fill #2

## 2023-06-21 ENCOUNTER — Other Ambulatory Visit (HOSPITAL_COMMUNITY): Payer: Self-pay

## 2023-06-24 ENCOUNTER — Other Ambulatory Visit (HOSPITAL_COMMUNITY): Payer: Self-pay

## 2023-06-24 DIAGNOSIS — I169 Hypertensive crisis, unspecified: Secondary | ICD-10-CM | POA: Diagnosis not present

## 2023-06-24 DIAGNOSIS — R8290 Unspecified abnormal findings in urine: Secondary | ICD-10-CM | POA: Diagnosis not present

## 2023-06-24 DIAGNOSIS — G8191 Hemiplegia, unspecified affecting right dominant side: Secondary | ICD-10-CM | POA: Diagnosis not present

## 2023-06-24 DIAGNOSIS — R32 Unspecified urinary incontinence: Secondary | ICD-10-CM | POA: Diagnosis not present

## 2023-06-25 DIAGNOSIS — I169 Hypertensive crisis, unspecified: Secondary | ICD-10-CM | POA: Diagnosis not present

## 2023-06-26 DIAGNOSIS — I169 Hypertensive crisis, unspecified: Secondary | ICD-10-CM | POA: Diagnosis not present

## 2023-06-27 DIAGNOSIS — I169 Hypertensive crisis, unspecified: Secondary | ICD-10-CM | POA: Diagnosis not present

## 2023-07-02 DIAGNOSIS — I169 Hypertensive crisis, unspecified: Secondary | ICD-10-CM | POA: Diagnosis not present

## 2023-07-03 DIAGNOSIS — I169 Hypertensive crisis, unspecified: Secondary | ICD-10-CM | POA: Diagnosis not present

## 2023-07-05 DIAGNOSIS — I169 Hypertensive crisis, unspecified: Secondary | ICD-10-CM | POA: Diagnosis not present

## 2023-07-08 DIAGNOSIS — I169 Hypertensive crisis, unspecified: Secondary | ICD-10-CM | POA: Diagnosis not present

## 2023-07-09 DIAGNOSIS — I169 Hypertensive crisis, unspecified: Secondary | ICD-10-CM | POA: Diagnosis not present

## 2023-07-10 DIAGNOSIS — I169 Hypertensive crisis, unspecified: Secondary | ICD-10-CM | POA: Diagnosis not present

## 2023-07-11 DIAGNOSIS — I169 Hypertensive crisis, unspecified: Secondary | ICD-10-CM | POA: Diagnosis not present

## 2023-07-12 ENCOUNTER — Other Ambulatory Visit (HOSPITAL_COMMUNITY): Payer: Self-pay

## 2023-07-12 ENCOUNTER — Other Ambulatory Visit: Payer: Self-pay

## 2023-07-12 DIAGNOSIS — I169 Hypertensive crisis, unspecified: Secondary | ICD-10-CM | POA: Diagnosis not present

## 2023-07-14 DIAGNOSIS — R8290 Unspecified abnormal findings in urine: Secondary | ICD-10-CM | POA: Diagnosis not present

## 2023-07-14 DIAGNOSIS — R32 Unspecified urinary incontinence: Secondary | ICD-10-CM | POA: Diagnosis not present

## 2023-07-14 DIAGNOSIS — G8191 Hemiplegia, unspecified affecting right dominant side: Secondary | ICD-10-CM | POA: Diagnosis not present

## 2023-07-17 DIAGNOSIS — I169 Hypertensive crisis, unspecified: Secondary | ICD-10-CM | POA: Diagnosis not present

## 2023-07-19 ENCOUNTER — Other Ambulatory Visit (HOSPITAL_COMMUNITY): Payer: Self-pay

## 2023-07-22 DIAGNOSIS — I169 Hypertensive crisis, unspecified: Secondary | ICD-10-CM | POA: Diagnosis not present

## 2023-07-24 DIAGNOSIS — I169 Hypertensive crisis, unspecified: Secondary | ICD-10-CM | POA: Diagnosis not present

## 2023-07-25 DIAGNOSIS — I169 Hypertensive crisis, unspecified: Secondary | ICD-10-CM | POA: Diagnosis not present

## 2023-07-26 DIAGNOSIS — I169 Hypertensive crisis, unspecified: Secondary | ICD-10-CM | POA: Diagnosis not present

## 2023-07-29 DIAGNOSIS — I169 Hypertensive crisis, unspecified: Secondary | ICD-10-CM | POA: Diagnosis not present

## 2023-07-30 DIAGNOSIS — I169 Hypertensive crisis, unspecified: Secondary | ICD-10-CM | POA: Diagnosis not present

## 2023-07-31 DIAGNOSIS — I169 Hypertensive crisis, unspecified: Secondary | ICD-10-CM | POA: Diagnosis not present

## 2023-08-01 DIAGNOSIS — I169 Hypertensive crisis, unspecified: Secondary | ICD-10-CM | POA: Diagnosis not present

## 2023-08-02 DIAGNOSIS — I169 Hypertensive crisis, unspecified: Secondary | ICD-10-CM | POA: Diagnosis not present

## 2023-08-05 DIAGNOSIS — I169 Hypertensive crisis, unspecified: Secondary | ICD-10-CM | POA: Diagnosis not present

## 2023-08-08 ENCOUNTER — Telehealth: Payer: Self-pay

## 2023-08-08 DIAGNOSIS — I169 Hypertensive crisis, unspecified: Secondary | ICD-10-CM | POA: Diagnosis not present

## 2023-08-08 NOTE — Telephone Encounter (Signed)
CMN for incontinence products faxed to [facility].

## 2023-08-12 ENCOUNTER — Other Ambulatory Visit: Payer: Self-pay | Admitting: Critical Care Medicine

## 2023-08-12 ENCOUNTER — Other Ambulatory Visit (HOSPITAL_COMMUNITY): Payer: Self-pay

## 2023-08-12 DIAGNOSIS — I169 Hypertensive crisis, unspecified: Secondary | ICD-10-CM | POA: Diagnosis not present

## 2023-08-13 ENCOUNTER — Encounter: Payer: Self-pay | Admitting: Pharmacist

## 2023-08-13 ENCOUNTER — Other Ambulatory Visit: Payer: Self-pay

## 2023-08-13 DIAGNOSIS — R32 Unspecified urinary incontinence: Secondary | ICD-10-CM | POA: Diagnosis not present

## 2023-08-13 DIAGNOSIS — I169 Hypertensive crisis, unspecified: Secondary | ICD-10-CM | POA: Diagnosis not present

## 2023-08-13 DIAGNOSIS — R8290 Unspecified abnormal findings in urine: Secondary | ICD-10-CM | POA: Diagnosis not present

## 2023-08-13 DIAGNOSIS — G8191 Hemiplegia, unspecified affecting right dominant side: Secondary | ICD-10-CM | POA: Diagnosis not present

## 2023-08-13 NOTE — Telephone Encounter (Signed)
Requested medication (s) are due for refill today: yes  Requested medication (s) are on the active medication list: yes  Last refill:  02/26/23 #60 4 refills  Future visit scheduled: yes in 3 months  Notes to clinic:  not delegated per protocol. Do you want to refill. Last refill Dr. [name]?     Requested Prescriptions  Pending Prescriptions Disp Refills   pregabalin (LYRICA) 150 MG capsule 60 capsule 4    Sig: Take 1 capsule (150 mg total) by mouth 2 (two) times daily.     Not Delegated - Neurology:  Anticonvulsants - Controlled - pregabalin Failed - 08/12/2023 12:26 PM      Failed - This refill cannot be delegated      Passed - Cr in normal range and within 360 days    Creat  Date Value Ref Range Status  04/28/2021 0.92 0.70 - 1.33 mg/dL Final    Comment:    For patients >80 years of age, the reference limit for Creatinine is approximately 13% higher for people identified as African-American. .    Creatinine, Ser  Date Value Ref Range Status  02/26/2023 0.86 0.76 - 1.27 mg/dL Final         Passed - Completed PHQ-2 or PHQ-9 in the last 360 days      Passed - Valid encounter within last 12 months    Recent Outpatient Visits           5 months ago Essential hypertension   [place] [facility] Storm Frisk, MD   9 months ago Essential hypertension   [facility] Storm Frisk, MD   1 year ago Essential hypertension   [place] [facility] Storm Frisk, MD   1 year ago Essential hypertension   [place] [facility] Storm Frisk, MD   1 year ago Essential hypertension    [facility] Storm Frisk, MD       Future Appointments             In 3 months Laural Benes Binnie Rail, MD [facility]

## 2023-08-14 ENCOUNTER — Other Ambulatory Visit: Payer: Self-pay | Admitting: Physician Assistant

## 2023-08-14 ENCOUNTER — Other Ambulatory Visit: Payer: Self-pay

## 2023-08-14 DIAGNOSIS — I169 Hypertensive crisis, unspecified: Secondary | ICD-10-CM | POA: Diagnosis not present

## 2023-08-14 DIAGNOSIS — I6930 Unspecified sequelae of cerebral infarction: Secondary | ICD-10-CM

## 2023-08-14 MED ORDER — PREGABALIN 150 MG PO CAPS
150.0000 mg | ORAL_CAPSULE | Freq: Two times a day (BID) | ORAL | 4 refills | Status: DC
  Filled 2023-08-14 – 2023-08-16 (×2): qty 60, 30d supply, fill #0
  Filled 2023-09-17: qty 60, 30d supply, fill #1
  Filled 2023-10-14: qty 60, 30d supply, fill #2
  Filled 2023-11-11: qty 60, 30d supply, fill #3
  Filled 2023-12-16: qty 60, 30d supply, fill #4

## 2023-08-14 NOTE — Progress Notes (Signed)
Check of [place] controlled substance registry appropriate

## 2023-08-16 ENCOUNTER — Other Ambulatory Visit: Payer: Self-pay

## 2023-08-16 ENCOUNTER — Other Ambulatory Visit (HOSPITAL_COMMUNITY): Payer: Self-pay

## 2023-08-19 ENCOUNTER — Other Ambulatory Visit (HOSPITAL_COMMUNITY): Payer: Self-pay

## 2023-08-19 ENCOUNTER — Other Ambulatory Visit: Payer: Self-pay

## 2023-08-20 DIAGNOSIS — I169 Hypertensive crisis, unspecified: Secondary | ICD-10-CM | POA: Diagnosis not present

## 2023-08-21 DIAGNOSIS — I169 Hypertensive crisis, unspecified: Secondary | ICD-10-CM | POA: Diagnosis not present

## 2023-08-22 DIAGNOSIS — I169 Hypertensive crisis, unspecified: Secondary | ICD-10-CM | POA: Diagnosis not present

## 2023-08-23 DIAGNOSIS — I169 Hypertensive crisis, unspecified: Secondary | ICD-10-CM | POA: Diagnosis not present

## 2023-08-30 DIAGNOSIS — I169 Hypertensive crisis, unspecified: Secondary | ICD-10-CM | POA: Diagnosis not present

## 2023-09-04 DIAGNOSIS — I169 Hypertensive crisis, unspecified: Secondary | ICD-10-CM | POA: Diagnosis not present

## 2023-09-05 DIAGNOSIS — I169 Hypertensive crisis, unspecified: Secondary | ICD-10-CM | POA: Diagnosis not present

## 2023-09-06 DIAGNOSIS — I169 Hypertensive crisis, unspecified: Secondary | ICD-10-CM | POA: Diagnosis not present

## 2023-09-09 DIAGNOSIS — I169 Hypertensive crisis, unspecified: Secondary | ICD-10-CM | POA: Diagnosis not present

## 2023-09-10 DIAGNOSIS — I169 Hypertensive crisis, unspecified: Secondary | ICD-10-CM | POA: Diagnosis not present

## 2023-09-13 DIAGNOSIS — R32 Unspecified urinary incontinence: Secondary | ICD-10-CM | POA: Diagnosis not present

## 2023-09-13 DIAGNOSIS — R8290 Unspecified abnormal findings in urine: Secondary | ICD-10-CM | POA: Diagnosis not present

## 2023-09-13 DIAGNOSIS — G8191 Hemiplegia, unspecified affecting right dominant side: Secondary | ICD-10-CM | POA: Diagnosis not present

## 2023-09-17 ENCOUNTER — Other Ambulatory Visit: Payer: Self-pay | Admitting: Critical Care Medicine

## 2023-09-17 ENCOUNTER — Other Ambulatory Visit: Payer: Self-pay

## 2023-09-17 ENCOUNTER — Other Ambulatory Visit (HOSPITAL_COMMUNITY): Payer: Self-pay

## 2023-09-17 DIAGNOSIS — I169 Hypertensive crisis, unspecified: Secondary | ICD-10-CM | POA: Diagnosis not present

## 2023-09-18 ENCOUNTER — Other Ambulatory Visit: Payer: Self-pay

## 2023-09-18 ENCOUNTER — Other Ambulatory Visit (HOSPITAL_COMMUNITY): Payer: Self-pay

## 2023-09-18 DIAGNOSIS — I169 Hypertensive crisis, unspecified: Secondary | ICD-10-CM | POA: Diagnosis not present

## 2023-09-18 MED ORDER — BACLOFEN 20 MG PO TABS
20.0000 mg | ORAL_TABLET | Freq: Three times a day (TID) | ORAL | 0 refills | Status: DC
Start: 2023-09-18 — End: 2023-11-04
  Filled 2023-09-18: qty 90, 30d supply, fill #0
  Filled 2023-10-14: qty 90, 30d supply, fill #1

## 2023-09-18 MED ORDER — HYDROXYZINE PAMOATE 50 MG PO CAPS
50.0000 mg | ORAL_CAPSULE | Freq: Three times a day (TID) | ORAL | 1 refills | Status: DC | PRN
Start: 2023-09-18 — End: 2023-11-04
  Filled 2023-09-18: qty 90, 30d supply, fill #0
  Filled 2023-10-14: qty 90, 30d supply, fill #1

## 2023-09-19 ENCOUNTER — Other Ambulatory Visit: Payer: Self-pay

## 2023-09-19 ENCOUNTER — Other Ambulatory Visit (HOSPITAL_COMMUNITY): Payer: Self-pay

## 2023-09-19 DIAGNOSIS — I169 Hypertensive crisis, unspecified: Secondary | ICD-10-CM | POA: Diagnosis not present

## 2023-09-20 DIAGNOSIS — I169 Hypertensive crisis, unspecified: Secondary | ICD-10-CM | POA: Diagnosis not present

## 2023-09-26 DIAGNOSIS — I169 Hypertensive crisis, unspecified: Secondary | ICD-10-CM | POA: Diagnosis not present

## 2023-09-27 DIAGNOSIS — I169 Hypertensive crisis, unspecified: Secondary | ICD-10-CM | POA: Diagnosis not present

## 2023-09-30 DIAGNOSIS — I169 Hypertensive crisis, unspecified: Secondary | ICD-10-CM | POA: Diagnosis not present

## 2023-10-01 DIAGNOSIS — I169 Hypertensive crisis, unspecified: Secondary | ICD-10-CM | POA: Diagnosis not present

## 2023-10-02 DIAGNOSIS — I169 Hypertensive crisis, unspecified: Secondary | ICD-10-CM | POA: Diagnosis not present

## 2023-10-03 DIAGNOSIS — I169 Hypertensive crisis, unspecified: Secondary | ICD-10-CM | POA: Diagnosis not present

## 2023-10-04 DIAGNOSIS — I169 Hypertensive crisis, unspecified: Secondary | ICD-10-CM | POA: Diagnosis not present

## 2023-10-07 DIAGNOSIS — I169 Hypertensive crisis, unspecified: Secondary | ICD-10-CM | POA: Diagnosis not present

## 2023-10-08 DIAGNOSIS — I169 Hypertensive crisis, unspecified: Secondary | ICD-10-CM | POA: Diagnosis not present

## 2023-10-13 DIAGNOSIS — R8290 Unspecified abnormal findings in urine: Secondary | ICD-10-CM | POA: Diagnosis not present

## 2023-10-13 DIAGNOSIS — R32 Unspecified urinary incontinence: Secondary | ICD-10-CM | POA: Diagnosis not present

## 2023-10-13 DIAGNOSIS — G8191 Hemiplegia, unspecified affecting right dominant side: Secondary | ICD-10-CM | POA: Diagnosis not present

## 2023-10-14 ENCOUNTER — Other Ambulatory Visit (HOSPITAL_COMMUNITY): Payer: Self-pay

## 2023-10-14 ENCOUNTER — Other Ambulatory Visit: Payer: Self-pay | Admitting: Critical Care Medicine

## 2023-10-14 DIAGNOSIS — I169 Hypertensive crisis, unspecified: Secondary | ICD-10-CM | POA: Diagnosis not present

## 2023-10-15 DIAGNOSIS — I169 Hypertensive crisis, unspecified: Secondary | ICD-10-CM | POA: Diagnosis not present

## 2023-10-17 DIAGNOSIS — I169 Hypertensive crisis, unspecified: Secondary | ICD-10-CM | POA: Diagnosis not present

## 2023-10-18 DIAGNOSIS — I169 Hypertensive crisis, unspecified: Secondary | ICD-10-CM | POA: Diagnosis not present

## 2023-10-22 DIAGNOSIS — I169 Hypertensive crisis, unspecified: Secondary | ICD-10-CM | POA: Diagnosis not present

## 2023-10-23 ENCOUNTER — Telehealth: Payer: Self-pay | Admitting: Critical Care Medicine

## 2023-10-23 DIAGNOSIS — I169 Hypertensive crisis, unspecified: Secondary | ICD-10-CM | POA: Diagnosis not present

## 2023-10-23 NOTE — Telephone Encounter (Signed)
Copied from CRM [id number]. Topic: General - Other >> Oct 23, 2023  8:30 AM Franchot Heidelberg wrote: Reason for CRM: Angie from [facility] called to report that they recently received office notes, they must be dated within 6 months of August 08 2023. She says that they are missing incontinence as the diagnosis. Please addend and resubmit.   Best contact: [phone]  Fax: [phone]

## 2023-10-24 DIAGNOSIS — I169 Hypertensive crisis, unspecified: Secondary | ICD-10-CM | POA: Diagnosis not present

## 2023-10-25 DIAGNOSIS — I169 Hypertensive crisis, unspecified: Secondary | ICD-10-CM | POA: Diagnosis not present

## 2023-10-25 NOTE — Telephone Encounter (Signed)
Called [employer] and update them the patient has not been seen in 6 months and has appointment on 11/04/23, they stated that they send a request closer to that date

## 2023-10-29 DIAGNOSIS — I169 Hypertensive crisis, unspecified: Secondary | ICD-10-CM | POA: Diagnosis not present

## 2023-10-31 DIAGNOSIS — I169 Hypertensive crisis, unspecified: Secondary | ICD-10-CM | POA: Diagnosis not present

## 2023-11-01 DIAGNOSIS — I169 Hypertensive crisis, unspecified: Secondary | ICD-10-CM | POA: Diagnosis not present

## 2023-11-01 IMAGING — CT CT HEAD W/O CM
4 series · 16 of 47 positions shown, 18 images · non-contrast
Comparison: CT 09/15/2020

CLINICAL DATA: Mental status change



[Series 2: head wo · axial · 0.42mm/px · z∈[-327,-207]mm · 7 of 32 slices shown, 9 images]
[im 4/32  brain]
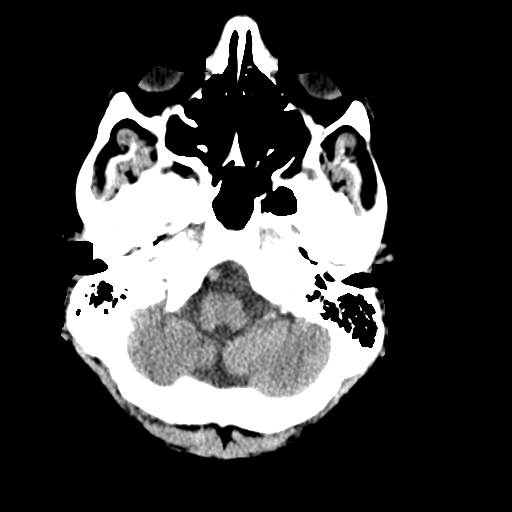
[im 4/32  bone]
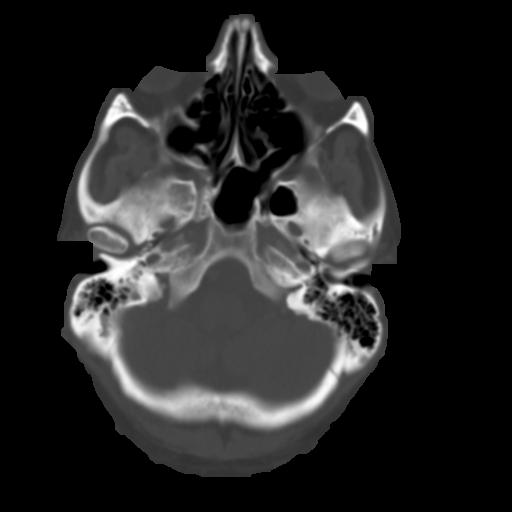
[im 8/32  brain]
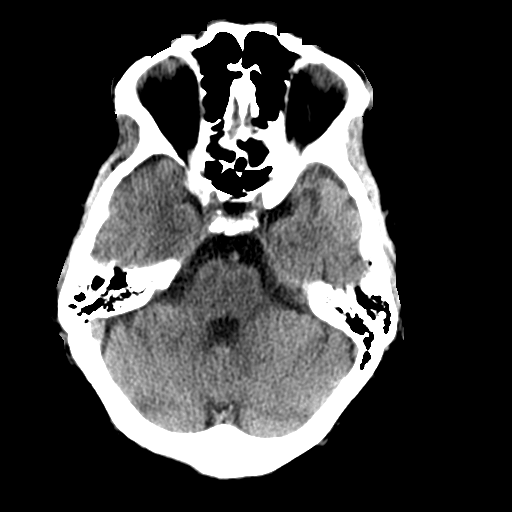
[im 12/32  brain]
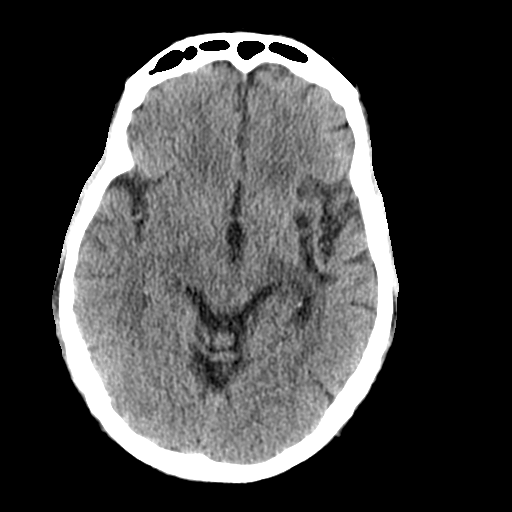
[im 16/32  brain]
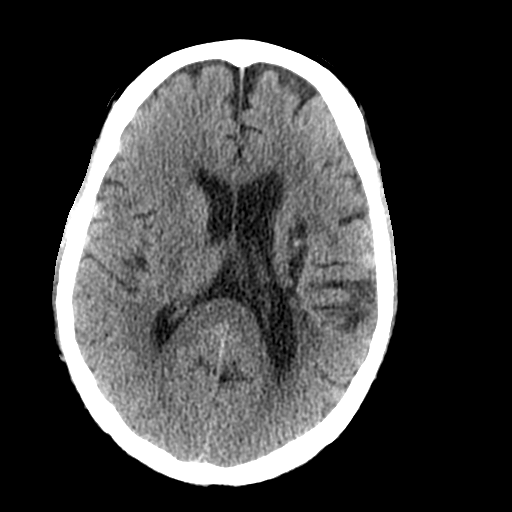
[im 20/32  brain]
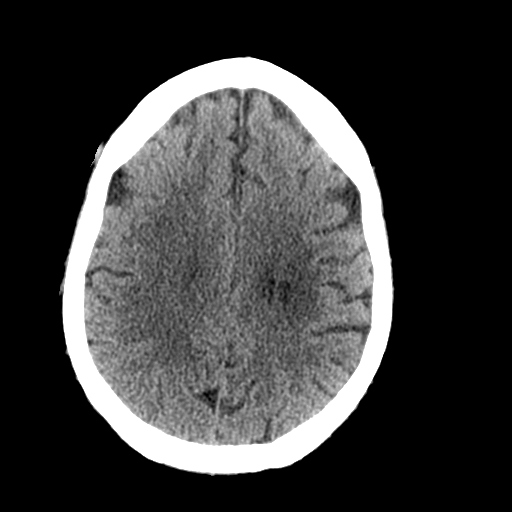
[im 20/32  bone]
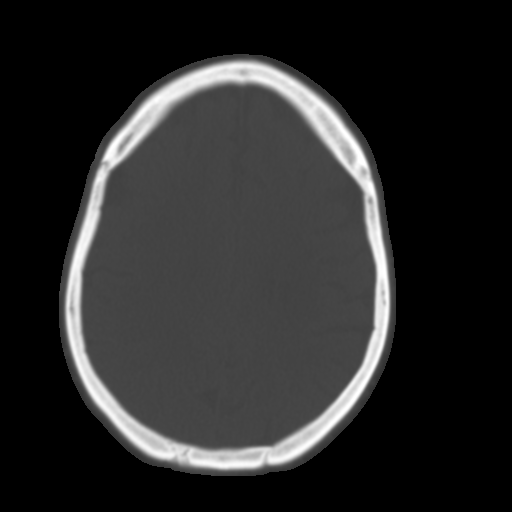
[im 24/32  brain]
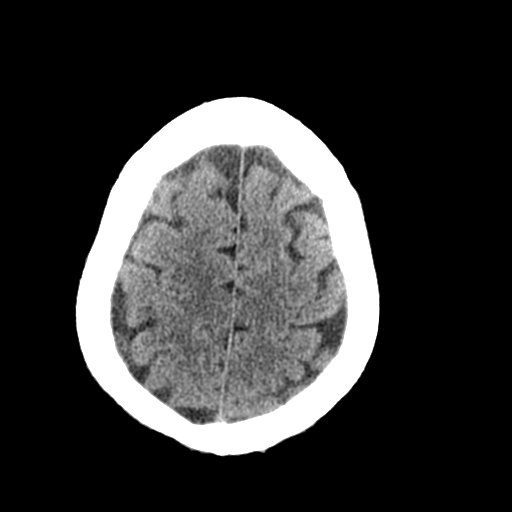
[im 28/32  brain]
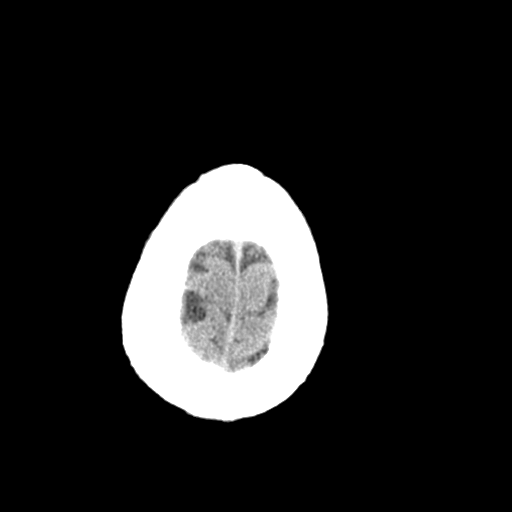

[Series 3: head bone · axial · 0.42mm/px · z∈[-328,-296]mm · 3 of 80 slices shown]
[im 8/80  bone]
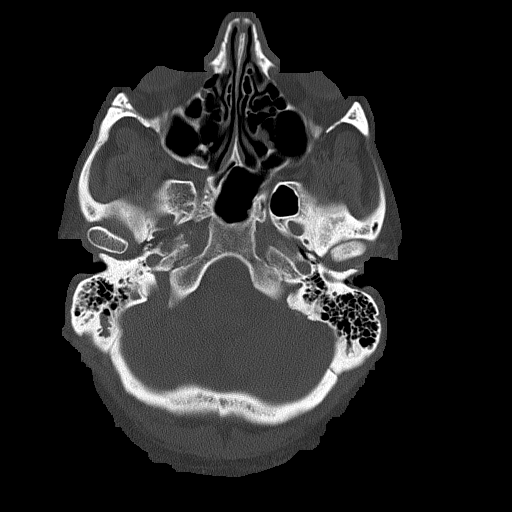
[im 16/80  bone]
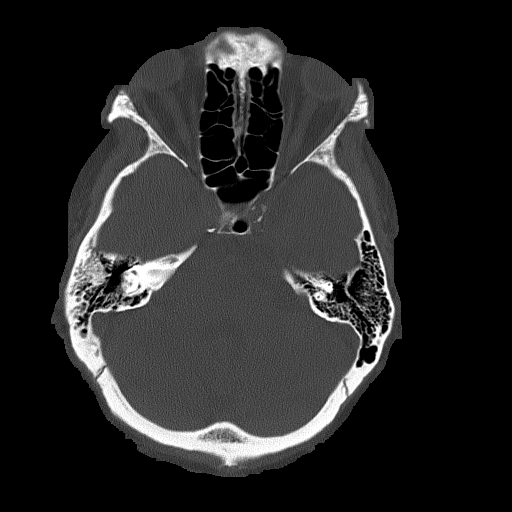
[im 24/80  bone]
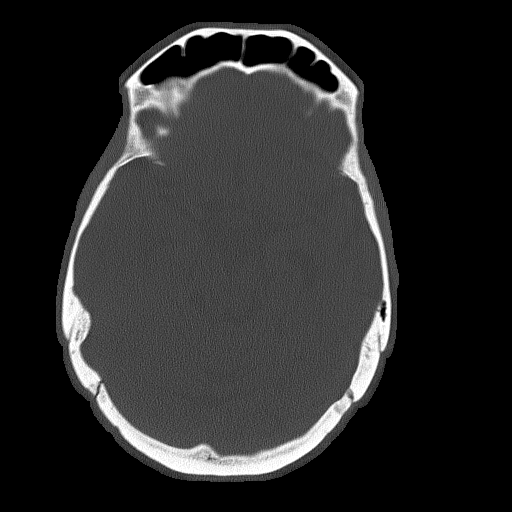

[Series 5: coronal soft tissue · coronal · 0.31mm/px · 3 of 70 slices shown]
[im 24/70  brain]
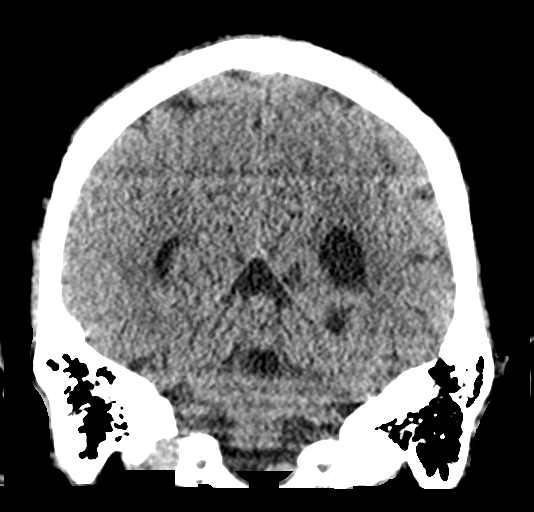
[im 31/70  brain]
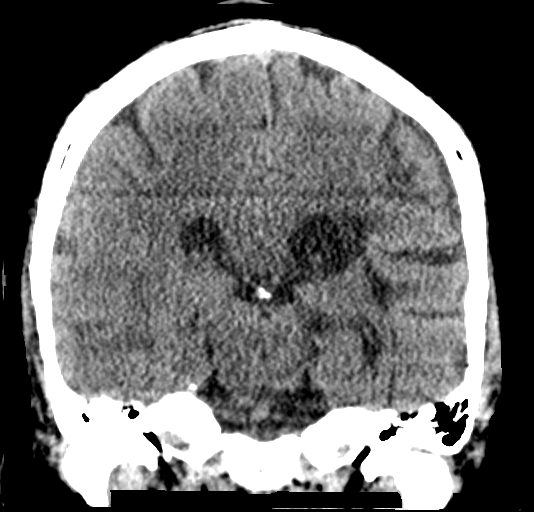
[im 39/70  brain]
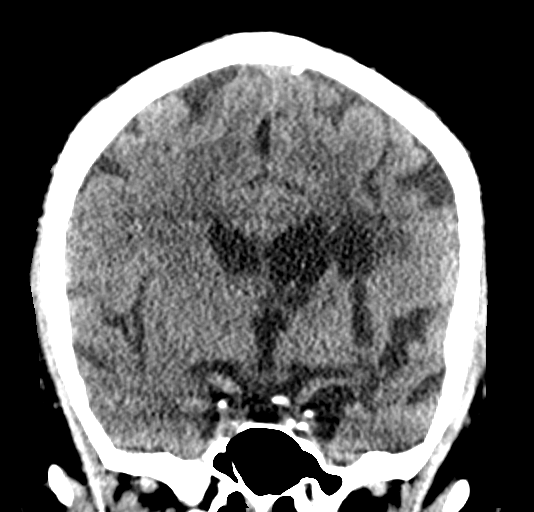

[Series 6: sagittal soft tissue · sagittal · 0.31mm/px · 3 of 56 slices shown]
[im 19/56  brain]
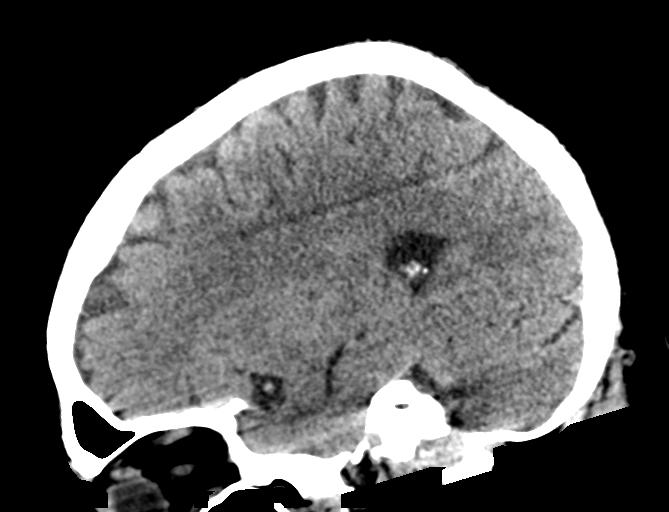
[im 28/56  brain]
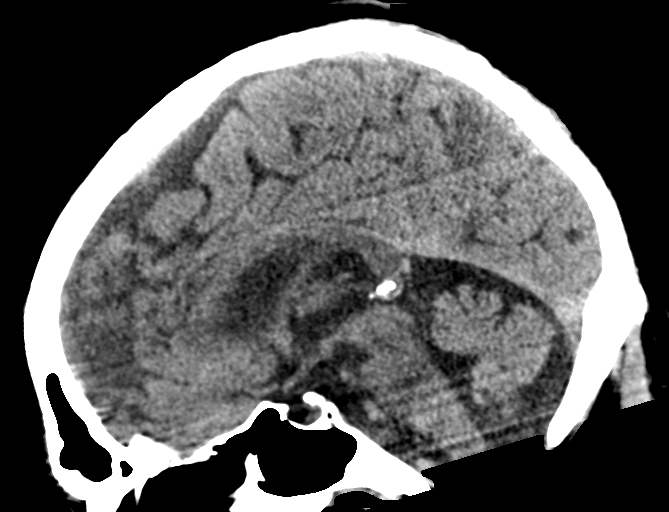
[im 37/56  brain]
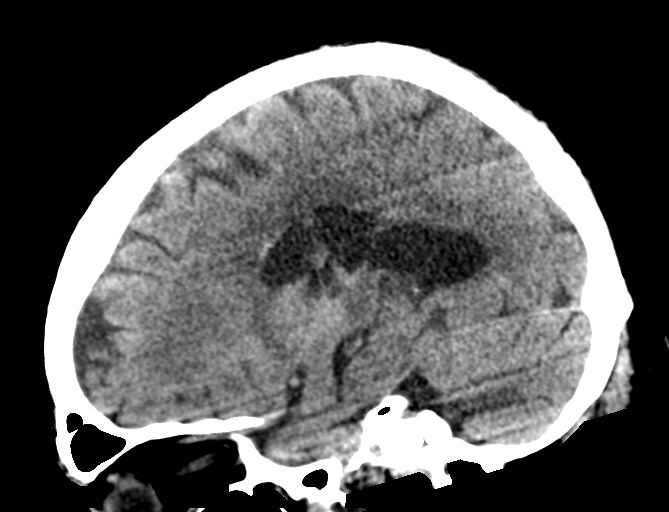

[16 of 47 positions shown; findings below may reference images not displayed]

FINDINGS: Brain: No acute territorial infarction, hemorrhage, or intracranial
mass. Interval encephalomalacia within the left external capsule and
adjacent white matter at the site of prior hemorrhage. Mild ex vacuo
dilatation of the left lateral ventricle. Mild atrophy.

Vascular: No hyperdense vessels.  Carotid vascular calcification

Skull: Normal. Negative for fracture or focal lesion.

Sinuses/Orbits: No acute finding.

Other: None
IMPRESSION: 1. No CT evidence for acute intracranial abnormality.
2. Interval encephalomalacia within the left basal ganglia and white
matter at the site of previous hemorrhage.

## 2023-11-01 IMAGING — CR DG CHEST 1V PORT
2 series · 2 of 2 positions shown · non-contrast
Comparison: Prior chest radiographs 09/12/2020 and earlier.

CLINICAL DATA: Provided history: Hypotensive. Additional history
provided: Nausea and vomiting.

EXAM:
PORTABLE CHEST 1 VIEW

[x chest ap (1 of 2)]
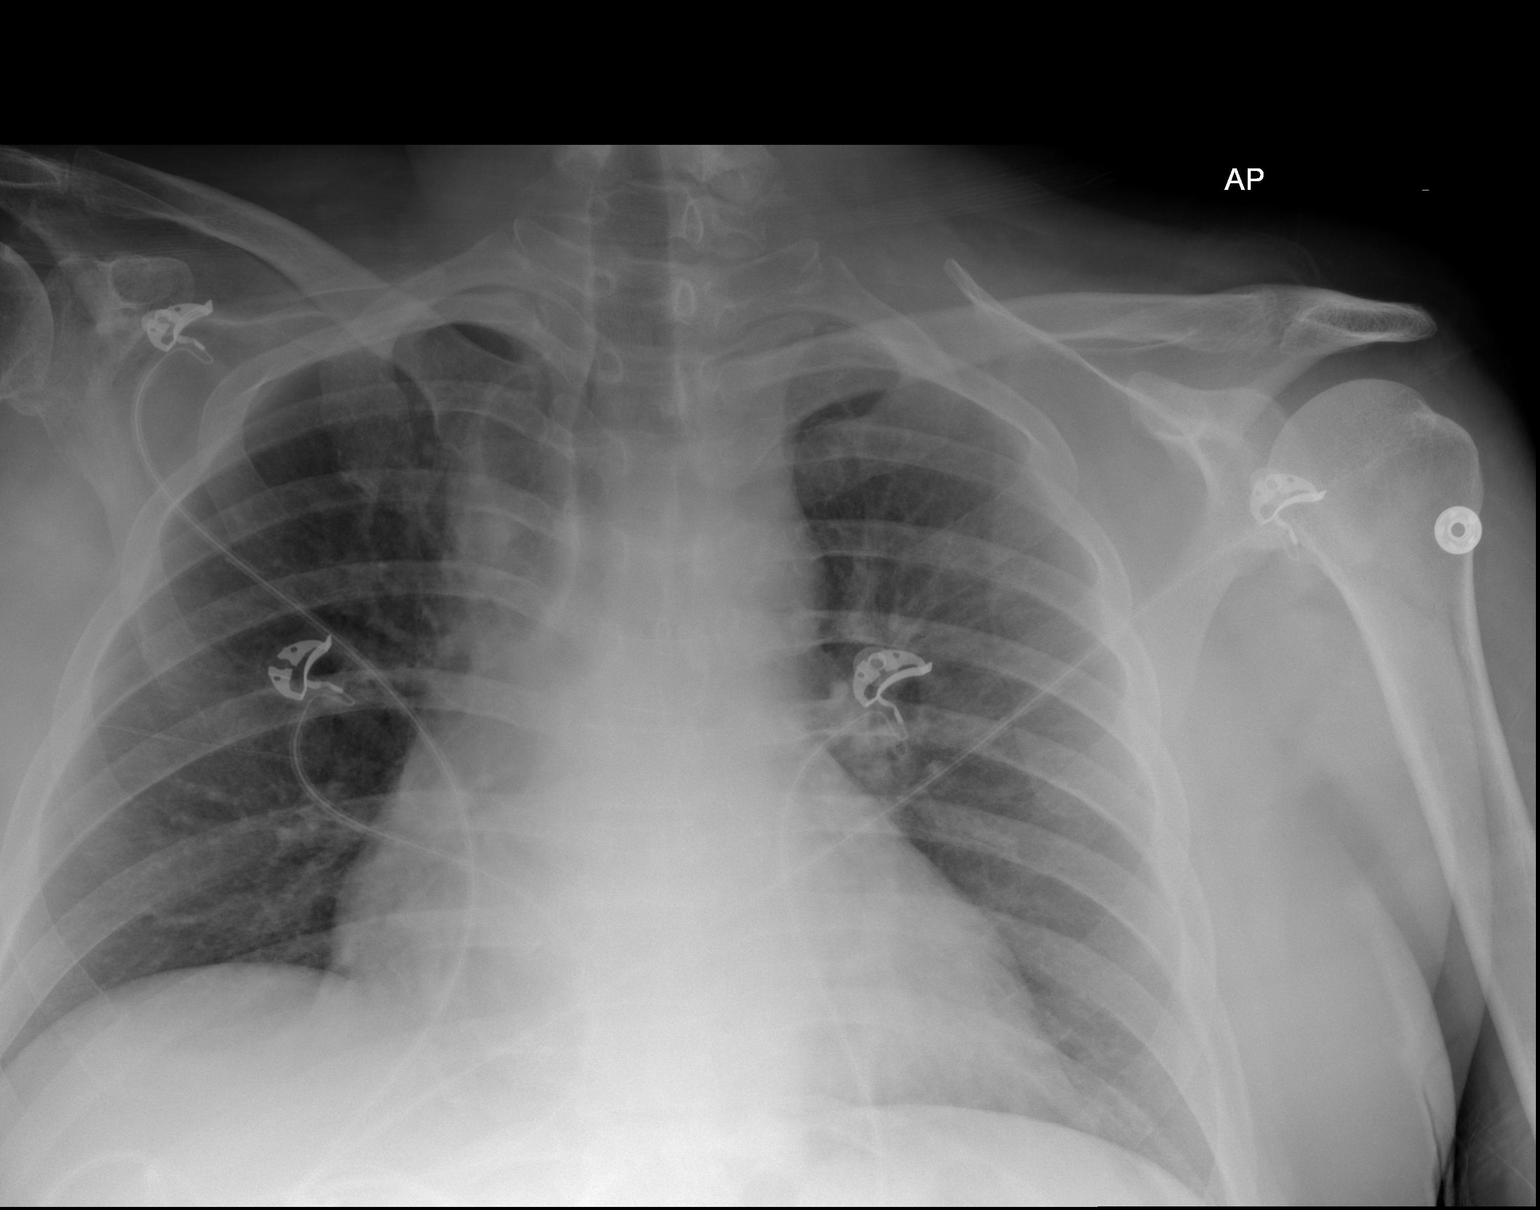

[x chest ap (2 of 2)]
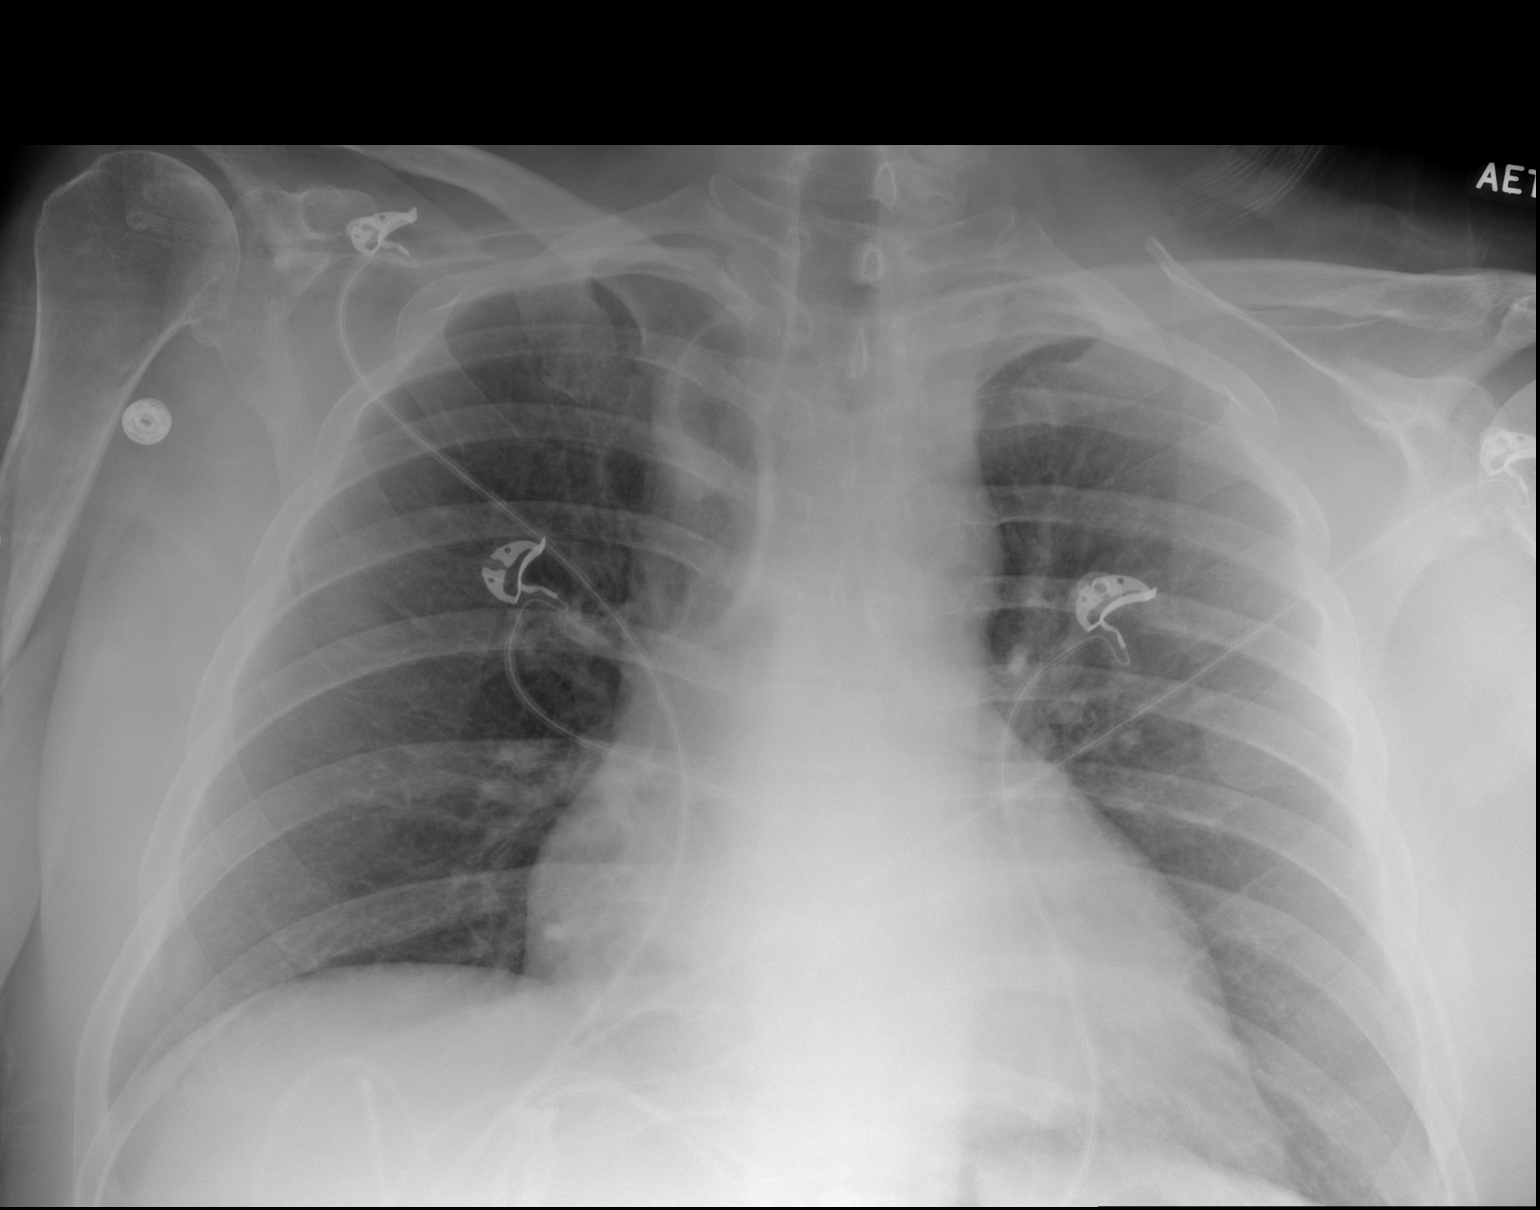

[2 of 2 positions shown; findings below may reference images not displayed]

FINDINGS: Heart size at the upper limits of normal, unchanged. Mild
ill-defined opacity within the left lung base, favored to reflect
atelectasis. No appreciable airspace consolidation within the right
lung. No evidence of pleural effusion or pneumothorax. Azygos
fissure. No acute bony abnormality identified.
IMPRESSION: Mild ill-defined opacity within the left lung base, favored to
reflect atelectasis.

The lungs are otherwise clear.

## 2023-11-04 ENCOUNTER — Other Ambulatory Visit: Payer: Self-pay

## 2023-11-04 ENCOUNTER — Ambulatory Visit: Payer: Medicaid Other | Attending: Critical Care Medicine | Admitting: Internal Medicine

## 2023-11-04 ENCOUNTER — Encounter: Payer: Self-pay | Admitting: Internal Medicine

## 2023-11-04 ENCOUNTER — Other Ambulatory Visit (HOSPITAL_BASED_OUTPATIENT_CLINIC_OR_DEPARTMENT_OTHER): Payer: Self-pay

## 2023-11-04 VITALS — BP 115/75 | HR 75 | Temp 97.9°F

## 2023-11-04 DIAGNOSIS — Z23 Encounter for immunization: Secondary | ICD-10-CM

## 2023-11-04 DIAGNOSIS — Z5320 Procedure and treatment not carried out because of patient's decision for unspecified reasons: Secondary | ICD-10-CM

## 2023-11-04 DIAGNOSIS — J069 Acute upper respiratory infection, unspecified: Secondary | ICD-10-CM

## 2023-11-04 DIAGNOSIS — Z2821 Immunization not carried out because of patient refusal: Secondary | ICD-10-CM | POA: Diagnosis not present

## 2023-11-04 DIAGNOSIS — I6930 Unspecified sequelae of cerebral infarction: Secondary | ICD-10-CM | POA: Diagnosis not present

## 2023-11-04 DIAGNOSIS — I10 Essential (primary) hypertension: Secondary | ICD-10-CM

## 2023-11-04 DIAGNOSIS — Z720 Tobacco use: Secondary | ICD-10-CM | POA: Diagnosis not present

## 2023-11-04 DIAGNOSIS — F411 Generalized anxiety disorder: Secondary | ICD-10-CM | POA: Diagnosis not present

## 2023-11-04 MED ORDER — BENZONATATE 100 MG PO CAPS
100.0000 mg | ORAL_CAPSULE | Freq: Three times a day (TID) | ORAL | 0 refills | Status: DC | PRN
  Filled 2023-11-04 (×2): qty 20, 7d supply, fill #0

## 2023-11-04 MED ORDER — HYDRALAZINE HCL 50 MG PO TABS
50.0000 mg | ORAL_TABLET | Freq: Two times a day (BID) | ORAL | 1 refills | Status: DC
  Filled 2023-11-04 – 2023-11-26 (×2): qty 180, 90d supply, fill #0
  Filled 2024-02-27: qty 180, 90d supply, fill #1

## 2023-11-04 MED ORDER — BACLOFEN 20 MG PO TABS
20.0000 mg | ORAL_TABLET | Freq: Three times a day (TID) | ORAL | 1 refills | Status: DC
  Filled 2023-11-04: qty 270, 90d supply, fill #0
  Filled 2023-11-11: qty 90, 30d supply, fill #0
  Filled 2023-12-16: qty 90, 30d supply, fill #1
  Filled 2024-01-15: qty 90, 30d supply, fill #2
  Filled 2024-02-12: qty 90, 30d supply, fill #0
  Filled 2024-03-13: qty 90, 30d supply, fill #1
  Filled 2024-04-13: qty 90, 30d supply, fill #2

## 2023-11-04 MED ORDER — HYDROXYZINE PAMOATE 50 MG PO CAPS
50.0000 mg | ORAL_CAPSULE | Freq: Three times a day (TID) | ORAL | 1 refills | Status: DC | PRN
  Filled 2023-11-04: qty 270, 90d supply, fill #0
  Filled 2023-11-11: qty 90, 30d supply, fill #0
  Filled 2023-12-16: qty 90, 30d supply, fill #1
  Filled 2024-01-15: qty 90, 30d supply, fill #2
  Filled 2024-02-14: qty 90, 30d supply, fill #3

## 2023-11-04 MED ORDER — TRAZODONE HCL 100 MG PO TABS
100.0000 mg | ORAL_TABLET | Freq: Every evening | ORAL | 2 refills | Status: DC | PRN
Start: 2023-11-04 — End: 2024-07-06
  Filled 2023-11-04 (×2): qty 90, 90d supply, fill #0
  Filled 2024-02-12: qty 90, 90d supply, fill #1
  Filled 2024-04-28: qty 90, 90d supply, fill #2

## 2023-11-04 MED ORDER — ALBUTEROL SULFATE HFA 108 (90 BASE) MCG/ACT IN AERS
2.0000 | INHALATION_SPRAY | Freq: Four times a day (QID) | RESPIRATORY_TRACT | 0 refills | Status: DC | PRN
  Filled 2023-11-04: qty 6.7, 25d supply, fill #0
  Filled 2023-11-04: qty 13.4, fill #0
  Filled 2023-11-05: qty 6.7, 25d supply, fill #0
  Filled 2023-11-11: qty 6.7, 20d supply, fill #0

## 2023-11-04 MED ORDER — AMLODIPINE BESYLATE 10 MG PO TABS
10.0000 mg | ORAL_TABLET | Freq: Every day | ORAL | 1 refills | Status: DC
Start: 2023-11-04 — End: 2024-03-05
  Filled 2023-11-04 – 2023-11-26 (×2): qty 90, 90d supply, fill #0
  Filled 2024-02-27: qty 90, 90d supply, fill #1

## 2023-11-04 MED ORDER — ATORVASTATIN CALCIUM 20 MG PO TABS
20.0000 mg | ORAL_TABLET | Freq: Every day | ORAL | 1 refills | Status: DC
  Filled 2023-11-04 – 2023-11-26 (×2): qty 90, 90d supply, fill #0
  Filled 2024-02-27: qty 90, 90d supply, fill #1

## 2023-11-04 NOTE — Progress Notes (Signed)
Patient ID: Jason Romero, male    DOB: November 13, 1970  MRN: [id number]  CC: Hypertension (HTN f/u. Med refill - requesting for it to be delivery /Cough, congestion X1 week /Yes to flu vax.)   Subjective: Jason Romero is a 52 y.o. male who presents for chronic ds management. Wife, [name] is with him and provides some of the hx. 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  Reports compliance with aspirin, hydralazine 50 mg twice a day, labetalol 300 mg daily, and amlodipine 10 mg daily.  Also on atorvastatin 20 mg daily. -Patient with dense right hemiparesis.  He takes baclofen for spasms that he gets on his right side and Lyrica for pain on the right side and in the lower back.  Not able to ambulate.  He is in wheelchair.  He has a bedside commode and urinal.  Does have shower chair but wife does dry wipe.  He has an aide who comes in 3 hours a day but other than that his wife is his primary caregiver.  They do not have transportation.  Medical transportation has not worked well for them. -Request refill on hydroxyzine which works well for his anxiety.  Also takes trazodone at nights to help with sleep and requests refill on that.  GERD: Doing well on pantoprazole.  Cough and congestion x 1 wk.  Bring up a little phlegm; cough more at nights.  No fever, sore throat or SOB. Using Mucinex and Robutissun; the latter helped more.    Tob dep: smoked for 30 yrs;  now 1/2 pk, was a full pk a day at 1 point.  He is not interested in lung cancer screening with low-dose CT scan.   HM:  yes to flu shot; declines lung CA screen.  No shingles vaccine.    Patient Active Problem List   Diagnosis Date Noted   Chronic hepatitis C without hepatic coma (HCC) 02/28/2021   Abnormality  of gait as late effect of cerebrovascular accident (CVA) 02/14/2021   Hyperlipidemia 02/14/2021   Visual changes 02/14/2021   Obesity 02/12/2021   Back pain 02/12/2021   Depression 01/26/2021   Spastic hemiplegia affecting nondominant side (HCC)    Dysphagia due to recent stroke 09/20/2020   History of intracranial hemorrhage 09/20/2020   History of stroke with residual deficit 09/11/2020   Sleep apnea 09/06/2016   Tobacco abuse 09/06/2016   Unintentional weight loss 03/22/2016   Essential hypertension 02/12/2016   Anxiety 02/12/2016   Migraines 02/12/2016   COPD (chronic obstructive pulmonary disease) (HCC) 02/01/2016   Gastroesophageal reflux disease without esophagitis 02/01/2016     Current Outpatient Medications on File Prior to Visit  Medication Sig Dispense Refill   acetaminophen (TYLENOL) 325 MG tablet Take 2 tablets (650 mg total) by mouth every 4 (four) hours as needed for mild pain (or temp > 37.5 C (99.5 F)).     amLODipine (NORVASC) 10 MG tablet Take 1 tablet (10 mg total) by mouth daily. 90 tablet 1   aspirin EC 81 MG tablet Take 1 tablet (81 mg total) by mouth daily. Swallow whole. 30 tablet 12   atorvastatin (LIPITOR) 20 MG tablet Take 1 tablet (20 mg total) by mouth daily. 90 tablet 1   baclofen (LIORESAL) 20 MG tablet Take 1 tablet (20 mg total)  by mouth 3 (three) times daily. 270 tablet 0   Blood Pressure Monitoring (BLOOD PRESSURE KIT) DEVI Use to monitor blood pressure 1 each 0   hydrALAZINE (APRESOLINE) 50 MG tablet Take 1 tablet (50 mg total) by mouth in the morning and at bedtime. 180 tablet 1   hydrOXYzine (VISTARIL) 50 MG capsule Take 1 capsule (50 mg total) by mouth every 8 (eight) hours as needed for anxiety. 270 capsule 1   labetalol (NORMODYNE) 300 MG tablet Take 1 tablet (300 mg total) by mouth 2 (two) times daily. 180 tablet 2   ondansetron (ZOFRAN) 4 MG tablet Take 1 tablet (4 mg total) by mouth every 8 (eight) hours as needed for nausea or vomiting. 20  tablet 1   pantoprazole (PROTONIX) 40 MG tablet Take 1 tablet (40 mg total) by mouth daily. 60 tablet 2   pregabalin (LYRICA) 150 MG capsule Take 1 capsule (150 mg total) by mouth 2 (two) times daily. 60 capsule 4   senna (SENOKOT) 8.6 MG TABS tablet Take 1 tablet (8.6 mg total) by mouth 2 (two) times daily. 120 tablet 0   traZODone (DESYREL) 100 MG tablet Take 1 tablet (100 mg total) by mouth at bedtime as needed for sleep. 60 tablet 2   No current facility-administered medications on file prior to visit.    Allergies  Allergen Reactions   Hydrocodone Hives   Ibuprofen Other (See Comments)    Pt can't take this medication because it interacts with other medications that he is taking.     Social History   Socioeconomic History   Marital status: Married    Spouse name: Not on file   Number of children: Not on file   Years of education: Not on file   Highest education level: Not on file  Occupational History   Not on file  Tobacco Use   Smoking status: Every Day    Current packs/day: 1.00    Types: Cigarettes   Smokeless tobacco: Never  Vaping Use   Vaping status: Never Used  Substance and Sexual Activity   Alcohol use: Not Currently    Alcohol/week: 0.0 standard drinks of alcohol   Drug use: Yes    Types: Marijuana    Comment: OCC   Sexual activity: Not Currently  Other Topics Concern   Not on file  Social History Narrative   Left handed   Social Drivers of Health   Financial Resource Strain: Medium Risk (11/08/2021)   Overall Financial Resource Strain (CARDIA)    Difficulty of Paying Living Expenses: Somewhat hard  Food Insecurity: No Food Insecurity (11/04/2023)   Hunger Vital Sign    Worried About Running Out of Food in the Last Year: Never true    Ran Out of Food in the Last Year: Never true  Transportation Needs: No Transportation Needs (11/04/2023)   PRAPARE - Administrator, Civil Service (Medical): No    Lack of Transportation (Non-Medical):  No  Physical Activity: Inactive (11/04/2023)   Exercise Vital Sign    Days of Exercise per Week: 0 days    Minutes of Exercise per Session: 0 min  Stress: No Stress Concern Present (11/04/2023)   [employer] of Occupational Health - Occupational Stress Questionnaire    Feeling of Stress : Not at all  Social Connections: Moderately Isolated (11/04/2023)   Social Connection and Isolation Panel [NHANES]    Frequency of Communication with Friends and Family: More than three times a week    Frequency  of Social Gatherings with Friends and Family: Twice a week    Attends Religious Services: Never    Database administrator or Organizations: No    Attends Banker Meetings: Never    Marital Status: Married  Catering manager Violence: Not At Risk (11/04/2023)   Humiliation, Afraid, Rape, and Kick questionnaire    Fear of Current or Ex-Partner: No    Emotionally Abused: No    Physically Abused: No    Sexually Abused: No    Family History  Problem Relation Age of Onset   Alcoholism Other    Arthritis Other    Breast cancer Other    Heart disease Mother     Past Surgical History:  Procedure Laterality Date   CHOLECYSTECTOMY      ROS: Review of Systems Negative except as stated above  PHYSICAL EXAM: BP 115/75 (BP Location: Left Arm, Patient Position: Sitting, Cuff Size: Normal)   Pulse 75   Temp 97.9 F (36.6 C) (Oral)   SpO2 98%   Physical Exam   General appearance -middle-age obese Caucasian male sitting in wheelchair in NAD Mental status -patient answers questions appropriately and follows commands Neck - supple, no significant adenopathy Chest - clear to auscultation, no wheezes, rales or rhonchi, symmetric air entry Heart - normal rate, regular rhythm, normal S1, S2, no murmurs, rubs, clicks or gallops Neurological -dense paresis on the right side.  Right-sided facial droop. Extremities -no edema in the left lower leg.  Mild edema in the right lower  leg.  Wife tells me this is chronic.     Latest Ref Rng & Units 02/26/2023    2:16 PM 11/13/2022    3:43 PM 06/06/2022   11:11 AM  CMP  Glucose 70 - 99 mg/dL 70  72  86   BUN 6 - 24 mg/dL 13  12  14    Creatinine 0.76 - 1.27 mg/dL 6.96  2.95  2.84   Sodium 134 - 144 mmol/L 142  140  140   Potassium 3.5 - 5.2 mmol/L 4.2  4.2  4.1   Chloride 96 - 106 mmol/L 105  101  104   CO2 20 - 29 mmol/L 22  25  21    Calcium 8.7 - 10.2 mg/dL 9.2  9.7  8.5   Total Protein 6.0 - 8.5 g/dL 7.0     Total Bilirubin 0.0 - 1.2 mg/dL 0.3     Alkaline Phos 44 - 121 IU/L 96     AST 0 - 40 IU/L 13     ALT 0 - 44 IU/L 15      Lipid Panel     Component Value Date/Time   CHOL 84 (L) 02/26/2023 1416   TRIG 78 02/26/2023 1416   HDL 28 (L) 02/26/2023 1416   CHOLHDL 3.0 02/26/2023 1416   CHOLHDL 3.1 09/12/2020 0513   VLDL 21 09/12/2020 0513   LDLCALC 40 02/26/2023 1416    CBC    Component Value Date/Time   WBC 8.1 02/26/2023 1416   WBC 9.3 03/15/2022 1511   RBC 4.60 02/26/2023 1416   RBC 4.35 03/15/2022 1511   HGB 12.4 (L) 02/26/2023 1416   HCT 38.3 02/26/2023 1416   PLT 342 02/26/2023 1416   MCV 83 02/26/2023 1416   MCV 84 03/10/2012 1336   MCH 27.0 02/26/2023 1416   MCH 29.2 03/15/2022 1511   MCHC 32.4 02/26/2023 1416   MCHC 33.3 03/15/2022 1511   RDW 15.6 (H) 02/26/2023 1416  RDW 14.4 03/10/2012 1336   LYMPHSABS 2.2 02/26/2023 1416   LYMPHSABS 2.3 03/10/2012 1336   MONOABS 0.5 03/15/2022 1511   MONOABS 0.8 03/10/2012 1336   EOSABS 0.2 02/26/2023 1416   EOSABS 0.2 03/10/2012 1336   BASOSABS 0.1 02/26/2023 1416   BASOSABS 0.1 03/10/2012 1336    ASSESSMENT AND PLAN: 1. Essential hypertension (Primary) At goal.  Continue Norvasc 10 mg daily, hydralazine 50 mg twice a day and labetalol 300 mg twice a day. - hydrALAZINE (APRESOLINE) 50 MG tablet; Take 1 tablet (50 mg total) by mouth in the morning and at bedtime.  Dispense: 180 tablet; Refill: 1 - amLODipine (NORVASC) 10 MG tablet; Take 1  tablet (10 mg total) by mouth daily.  Dispense: 90 tablet; Refill: 1 - CBC - Comprehensive metabolic panel  2. Upper respiratory tract infection, unspecified type Likely viral. - benzonatate (TESSALON PERLES) 100 MG capsule; Take 1 capsule (100 mg total) by mouth 3 (three) times daily as needed for cough.  Dispense: 20 capsule; Refill: 0 - albuterol (VENTOLIN HFA) 108 (90 Base) MCG/ACT inhaler; Inhale 2 puffs into the lungs every 6 (six) hours as needed for wheezing or shortness of breath.  Dispense: 6.7 g; Refill: 0  3. History of CVA with residual deficit Patient to continue aspirin and atorvastatin.  Continue his blood pressure medications.  I had received a form for PCS services.  I think it may be a renewal that is required by this date.  Will have our caseworker complete the form. - traZODone (DESYREL) 100 MG tablet; Take 1 tablet (100 mg total) by mouth at bedtime as needed for sleep.  Dispense: 90 tablet; Refill: 2 - hydrALAZINE (APRESOLINE) 50 MG tablet; Take 1 tablet (50 mg total) by mouth in the morning and at bedtime.  Dispense: 180 tablet; Refill: 1 - amLODipine (NORVASC) 10 MG tablet; Take 1 tablet (10 mg total) by mouth daily.  Dispense: 90 tablet; Refill: 1 - baclofen (LIORESAL) 20 MG tablet; Take 1 tablet (20 mg total) by mouth 3 (three) times daily.  Dispense: 270 tablet; Refill: 1 - atorvastatin (LIPITOR) 20 MG tablet; Take 1 tablet (20 mg total) by mouth daily.  Dispense: 90 tablet; Refill: 1  4. Tobacco abuse Strongly encouraged him to quit.  Patient not ready to give a trial of quitting.  5. GAD (generalized anxiety disorder) Doing well on hydroxyzine. - hydrOXYzine (VISTARIL) 50 MG capsule; Take 1 capsule (50 mg total) by mouth every 8 (eight) hours as needed for anxiety.  Dispense: 270 capsule; Refill: 1  6. Varicella zoster virus (VZV) vaccination declined   7. Lung cancer screening declined by patient   8. Encounter for immunization - Flu vaccine trivalent PF,  6mos and older(Flulaval,Afluria,Fluarix,Fluzone)     Patient was given the opportunity to ask questions.  Patient verbalized understanding of the plan and was able to repeat key elements of the plan.   This documentation was completed using Paediatric nurse.  Any transcriptional errors are unintentional.  No orders of the defined types were placed in this encounter.    Requested Prescriptions   Pending Prescriptions Disp Refills   traZODone (DESYREL) 100 MG tablet 60 tablet 2    Sig: Take 1 tablet (100 mg total) by mouth at bedtime as needed for sleep.   hydrALAZINE (APRESOLINE) 50 MG tablet 180 tablet 1    Sig: Take 1 tablet (50 mg total) by mouth in the morning and at bedtime.   hydrOXYzine (VISTARIL) 50 MG capsule 270  capsule 1    Sig: Take 1 capsule (50 mg total) by mouth every 8 (eight) hours as needed for anxiety.   amLODipine (NORVASC) 10 MG tablet 90 tablet 1    Sig: Take 1 tablet (10 mg total) by mouth daily.   baclofen (LIORESAL) 20 MG tablet 270 tablet 0    Sig: Take 1 tablet (20 mg total) by mouth 3 (three) times daily.   atorvastatin (LIPITOR) 20 MG tablet 90 tablet 1    Sig: Take 1 tablet (20 mg total) by mouth daily.    No follow-ups on file.  Jonah Blue, MD, FACP

## 2023-11-05 ENCOUNTER — Other Ambulatory Visit: Payer: Self-pay

## 2023-11-05 ENCOUNTER — Other Ambulatory Visit (HOSPITAL_COMMUNITY): Payer: Self-pay

## 2023-11-05 DIAGNOSIS — I169 Hypertensive crisis, unspecified: Secondary | ICD-10-CM | POA: Diagnosis not present

## 2023-11-05 LAB — CBC
Hematocrit: 40.4 % (ref 37.5–51.0)
Hemoglobin: 12.9 g/dL — ABNORMAL LOW (ref 13.0–17.7)
MCH: 27.2 pg (ref 26.6–33.0)
MCHC: 31.9 g/dL (ref 31.5–35.7)
MCV: 85 fL (ref 79–97)
Platelets: 372 10*3/uL (ref 150–450)
RBC: 4.75 x10E6/uL (ref 4.14–5.80)
RDW: 15.2 % (ref 11.6–15.4)
WBC: 11.5 10*3/uL — ABNORMAL HIGH (ref 3.4–10.8)

## 2023-11-05 LAB — COMPREHENSIVE METABOLIC PANEL
ALT: 10 [IU]/L (ref 0–44)
AST: 11 [IU]/L (ref 0–40)
Albumin: 4.5 g/dL (ref 3.8–4.9)
Alkaline Phosphatase: 120 [IU]/L (ref 44–121)
BUN/Creatinine Ratio: 10 (ref 9–20)
BUN: 11 mg/dL (ref 6–24)
Bilirubin Total: 0.3 mg/dL (ref 0.0–1.2)
CO2: 22 mmol/L (ref 20–29)
Calcium: 9.6 mg/dL (ref 8.7–10.2)
Chloride: 103 mmol/L (ref 96–106)
Creatinine, Ser: 1.08 mg/dL (ref 0.76–1.27)
Globulin, Total: 2.7 g/dL (ref 1.5–4.5)
Glucose: 94 mg/dL (ref 70–99)
Potassium: 4.6 mmol/L (ref 3.5–5.2)
Sodium: 140 mmol/L (ref 134–144)
Total Protein: 7.2 g/dL (ref 6.0–8.5)
eGFR: 82 mL/min/{1.73_m2} (ref >59)

## 2023-11-07 ENCOUNTER — Telehealth: Payer: Self-pay

## 2023-11-07 DIAGNOSIS — I169 Hypertensive crisis, unspecified: Secondary | ICD-10-CM | POA: Diagnosis not present

## 2023-11-07 NOTE — Telephone Encounter (Signed)
PCS request e-faxed to [facility]: [phone].

## 2023-11-08 DIAGNOSIS — I169 Hypertensive crisis, unspecified: Secondary | ICD-10-CM | POA: Diagnosis not present

## 2023-11-11 ENCOUNTER — Other Ambulatory Visit (HOSPITAL_COMMUNITY): Payer: Self-pay

## 2023-11-11 DIAGNOSIS — I169 Hypertensive crisis, unspecified: Secondary | ICD-10-CM | POA: Diagnosis not present

## 2023-11-12 ENCOUNTER — Other Ambulatory Visit: Payer: Self-pay

## 2023-11-12 DIAGNOSIS — I169 Hypertensive crisis, unspecified: Secondary | ICD-10-CM | POA: Diagnosis not present

## 2023-11-13 DIAGNOSIS — R8290 Unspecified abnormal findings in urine: Secondary | ICD-10-CM | POA: Diagnosis not present

## 2023-11-13 DIAGNOSIS — R32 Unspecified urinary incontinence: Secondary | ICD-10-CM | POA: Diagnosis not present

## 2023-11-13 DIAGNOSIS — G8191 Hemiplegia, unspecified affecting right dominant side: Secondary | ICD-10-CM | POA: Diagnosis not present

## 2023-11-14 ENCOUNTER — Other Ambulatory Visit (HOSPITAL_COMMUNITY): Payer: Self-pay

## 2023-11-14 ENCOUNTER — Telehealth: Payer: Self-pay

## 2023-11-14 ENCOUNTER — Other Ambulatory Visit: Payer: Self-pay

## 2023-11-14 DIAGNOSIS — I169 Hypertensive crisis, unspecified: Secondary | ICD-10-CM | POA: Diagnosis not present

## 2023-11-14 MED ORDER — ALBUTEROL SULFATE HFA 108 (90 BASE) MCG/ACT IN AERS
2.0000 | INHALATION_SPRAY | Freq: Four times a day (QID) | RESPIRATORY_TRACT | 2 refills | Status: DC | PRN
  Filled 2023-11-14: qty 18, 25d supply, fill #0
  Filled 2023-12-16: qty 18, 25d supply, fill #1
  Filled 2024-01-29: qty 18, 25d supply, fill #2

## 2023-11-14 NOTE — Addendum Note (Signed)
 Addended by: Lois Huxley, Jeannett Senior L on: 11/14/2023 04:45 PM   Modules accepted: Orders

## 2023-11-14 NOTE — Telephone Encounter (Signed)
 Copied from CRM [id number]. Topic: General - Other >> Nov 14, 2023  1:19 PM Yvone Marda Blow wrote: albuterol  (VENTOLIN  HFA) 108 (90 Base) MCG/ACT inhaler... Patient wife states per the pharmacy, insurance is no longer covering this medication. The [facility] recommended a different inhaler that is being covered, but she forgot what they said. Please advise

## 2023-11-18 NOTE — Telephone Encounter (Signed)
 Called patient & patient's wife but no answer. LVM to call back. Please inform that prescription has been sent to the pharmacy.

## 2023-11-19 ENCOUNTER — Ambulatory Visit: Payer: Medicaid Other | Admitting: Critical Care Medicine

## 2023-11-19 ENCOUNTER — Ambulatory Visit: Payer: Medicaid Other | Admitting: Internal Medicine

## 2023-11-19 NOTE — Telephone Encounter (Signed)
 Called but no answer. LVM informing that refill was sent to the pharmacy.

## 2023-11-20 NOTE — Telephone Encounter (Signed)
 Called but no answer. LVM informing that prescription has been sent to the pharmacy.

## 2023-11-25 DIAGNOSIS — I169 Hypertensive crisis, unspecified: Secondary | ICD-10-CM | POA: Diagnosis not present

## 2023-11-26 ENCOUNTER — Other Ambulatory Visit: Payer: Self-pay

## 2023-11-26 ENCOUNTER — Other Ambulatory Visit (HOSPITAL_COMMUNITY): Payer: Self-pay

## 2023-11-26 DIAGNOSIS — I169 Hypertensive crisis, unspecified: Secondary | ICD-10-CM | POA: Diagnosis not present

## 2023-11-27 DIAGNOSIS — I169 Hypertensive crisis, unspecified: Secondary | ICD-10-CM | POA: Diagnosis not present

## 2023-11-28 DIAGNOSIS — I169 Hypertensive crisis, unspecified: Secondary | ICD-10-CM | POA: Diagnosis not present

## 2023-12-04 DIAGNOSIS — I169 Hypertensive crisis, unspecified: Secondary | ICD-10-CM | POA: Diagnosis not present

## 2023-12-05 DIAGNOSIS — I169 Hypertensive crisis, unspecified: Secondary | ICD-10-CM | POA: Diagnosis not present

## 2023-12-06 DIAGNOSIS — I169 Hypertensive crisis, unspecified: Secondary | ICD-10-CM | POA: Diagnosis not present

## 2023-12-09 DIAGNOSIS — I6930 Unspecified sequelae of cerebral infarction: Secondary | ICD-10-CM | POA: Diagnosis not present

## 2023-12-10 DIAGNOSIS — I6930 Unspecified sequelae of cerebral infarction: Secondary | ICD-10-CM | POA: Diagnosis not present

## 2023-12-11 DIAGNOSIS — I6930 Unspecified sequelae of cerebral infarction: Secondary | ICD-10-CM | POA: Diagnosis not present

## 2023-12-14 DIAGNOSIS — R32 Unspecified urinary incontinence: Secondary | ICD-10-CM | POA: Diagnosis not present

## 2023-12-14 DIAGNOSIS — G8191 Hemiplegia, unspecified affecting right dominant side: Secondary | ICD-10-CM | POA: Diagnosis not present

## 2023-12-14 DIAGNOSIS — R8290 Unspecified abnormal findings in urine: Secondary | ICD-10-CM | POA: Diagnosis not present

## 2023-12-16 ENCOUNTER — Other Ambulatory Visit (HOSPITAL_COMMUNITY): Payer: Self-pay

## 2023-12-16 ENCOUNTER — Other Ambulatory Visit (HOSPITAL_BASED_OUTPATIENT_CLINIC_OR_DEPARTMENT_OTHER): Payer: Self-pay

## 2023-12-16 ENCOUNTER — Other Ambulatory Visit: Payer: Self-pay

## 2023-12-16 ENCOUNTER — Other Ambulatory Visit: Payer: Self-pay | Admitting: Critical Care Medicine

## 2023-12-16 DIAGNOSIS — I619 Nontraumatic intracerebral hemorrhage, unspecified: Secondary | ICD-10-CM

## 2023-12-16 DIAGNOSIS — I6930 Unspecified sequelae of cerebral infarction: Secondary | ICD-10-CM | POA: Diagnosis not present

## 2023-12-16 DIAGNOSIS — I10 Essential (primary) hypertension: Secondary | ICD-10-CM

## 2023-12-16 MED ORDER — LABETALOL HCL 300 MG PO TABS
300.0000 mg | ORAL_TABLET | Freq: Two times a day (BID) | ORAL | 0 refills | Status: DC
  Filled 2023-12-16: qty 60, 30d supply, fill #0
  Filled 2024-01-15: qty 60, 30d supply, fill #1
  Filled 2024-02-14: qty 60, 30d supply, fill #2

## 2023-12-17 ENCOUNTER — Other Ambulatory Visit: Payer: Self-pay

## 2023-12-17 ENCOUNTER — Other Ambulatory Visit (HOSPITAL_COMMUNITY): Payer: Self-pay

## 2023-12-17 DIAGNOSIS — I6930 Unspecified sequelae of cerebral infarction: Secondary | ICD-10-CM | POA: Diagnosis not present

## 2023-12-18 ENCOUNTER — Other Ambulatory Visit (HOSPITAL_COMMUNITY): Payer: Self-pay

## 2023-12-18 DIAGNOSIS — I6930 Unspecified sequelae of cerebral infarction: Secondary | ICD-10-CM | POA: Diagnosis not present

## 2023-12-19 DIAGNOSIS — I6930 Unspecified sequelae of cerebral infarction: Secondary | ICD-10-CM | POA: Diagnosis not present

## 2023-12-26 ENCOUNTER — Other Ambulatory Visit (HOSPITAL_COMMUNITY): Payer: Self-pay

## 2023-12-30 DIAGNOSIS — I6930 Unspecified sequelae of cerebral infarction: Secondary | ICD-10-CM | POA: Diagnosis not present

## 2023-12-31 DIAGNOSIS — I6930 Unspecified sequelae of cerebral infarction: Secondary | ICD-10-CM | POA: Diagnosis not present

## 2024-01-02 DIAGNOSIS — I6930 Unspecified sequelae of cerebral infarction: Secondary | ICD-10-CM | POA: Diagnosis not present

## 2024-01-07 DIAGNOSIS — I6930 Unspecified sequelae of cerebral infarction: Secondary | ICD-10-CM | POA: Diagnosis not present

## 2024-01-10 DIAGNOSIS — I6930 Unspecified sequelae of cerebral infarction: Secondary | ICD-10-CM | POA: Diagnosis not present

## 2024-01-11 DIAGNOSIS — R8290 Unspecified abnormal findings in urine: Secondary | ICD-10-CM | POA: Diagnosis not present

## 2024-01-11 DIAGNOSIS — G8191 Hemiplegia, unspecified affecting right dominant side: Secondary | ICD-10-CM | POA: Diagnosis not present

## 2024-01-11 DIAGNOSIS — R32 Unspecified urinary incontinence: Secondary | ICD-10-CM | POA: Diagnosis not present

## 2024-01-13 DIAGNOSIS — I6930 Unspecified sequelae of cerebral infarction: Secondary | ICD-10-CM | POA: Diagnosis not present

## 2024-01-14 DIAGNOSIS — I6930 Unspecified sequelae of cerebral infarction: Secondary | ICD-10-CM | POA: Diagnosis not present

## 2024-01-15 ENCOUNTER — Other Ambulatory Visit: Payer: Self-pay

## 2024-01-15 ENCOUNTER — Other Ambulatory Visit (HOSPITAL_COMMUNITY): Payer: Self-pay

## 2024-01-15 ENCOUNTER — Other Ambulatory Visit: Payer: Self-pay | Admitting: Physician Assistant

## 2024-01-15 DIAGNOSIS — I6930 Unspecified sequelae of cerebral infarction: Secondary | ICD-10-CM | POA: Diagnosis not present

## 2024-01-15 MED ORDER — PREGABALIN 150 MG PO CAPS
150.0000 mg | ORAL_CAPSULE | Freq: Two times a day (BID) | ORAL | 4 refills | Status: DC
Start: 2024-01-15 — End: 2024-06-10
  Filled 2024-01-15: qty 60, 30d supply, fill #0
  Filled 2024-02-12: qty 60, 30d supply, fill #1
  Filled 2024-03-13: qty 60, 30d supply, fill #2
  Filled 2024-04-13: qty 60, 30d supply, fill #3
  Filled 2024-05-13: qty 60, 30d supply, fill #4

## 2024-01-16 DIAGNOSIS — I6930 Unspecified sequelae of cerebral infarction: Secondary | ICD-10-CM | POA: Diagnosis not present

## 2024-01-21 DIAGNOSIS — I6930 Unspecified sequelae of cerebral infarction: Secondary | ICD-10-CM | POA: Diagnosis not present

## 2024-01-22 DIAGNOSIS — I6930 Unspecified sequelae of cerebral infarction: Secondary | ICD-10-CM | POA: Diagnosis not present

## 2024-01-23 ENCOUNTER — Encounter: Payer: Self-pay | Admitting: Internal Medicine

## 2024-01-23 ENCOUNTER — Telehealth (INDEPENDENT_AMBULATORY_CARE_PROVIDER_SITE_OTHER): Payer: Self-pay | Admitting: Internal Medicine

## 2024-01-23 ENCOUNTER — Other Ambulatory Visit (HOSPITAL_COMMUNITY): Payer: Self-pay

## 2024-01-23 ENCOUNTER — Other Ambulatory Visit: Payer: Self-pay

## 2024-01-23 ENCOUNTER — Ambulatory Visit: Payer: Self-pay | Admitting: Internal Medicine

## 2024-01-23 DIAGNOSIS — K047 Periapical abscess without sinus: Secondary | ICD-10-CM | POA: Diagnosis not present

## 2024-01-23 DIAGNOSIS — I6930 Unspecified sequelae of cerebral infarction: Secondary | ICD-10-CM | POA: Diagnosis not present

## 2024-01-23 MED ORDER — AMOXICILLIN-POT CLAVULANATE 875-125 MG PO TABS
1.0000 | ORAL_TABLET | Freq: Two times a day (BID) | ORAL | 0 refills | Status: AC
  Filled 2024-01-23: qty 14, 7d supply, fill #0

## 2024-01-23 NOTE — Telephone Encounter (Signed)
 Information obtained from wife, Dolphus Jenny.   Chief Complaint: tooth pain Symptoms: white/yellow drainage, swelling,  Frequency: 2-3 days Pertinent Negatives: Patient denies fever, injury Disposition: [] ED /[] Urgent Care (no appt availability in office) / [x] Appointment(In office/virtual)/ []  [place] [facility]/ [] Home Care/ [] Refused Recommended Disposition /[] [place] [facility]/ []  Follow-up with PCP Additional Notes: Pts wife calling d/t pts stroke hx and inability to speak clearly. Pt is having swelling to the right side of his mouth. Per wife there is a white/yellow drainage. Pt cannot get into a dentist office for about a month d/t him needing to establish care. Pt does not have transportation and wife is requesting that rx be called in. RN sched VV for pt for today.  Copied from CRM [id number]. Topic: Clinical - Medication Question >> Jan 23, 2024  9:44 AM Priscille Loveless wrote: Reason for CRM: Pt has an abscess tooth and is needing an antibiotic for it. He can't get into the dentist for a couple of months. Reason for Disposition  [1] SEVERE pain (e.g., excruciating, unable to eat, unable to do any normal activities) AND [2] not improved 2 hours after pain medicine  Answer Assessment - Initial Assessment Questions 1. LOCATION: "Which tooth is hurting?"  (e.g., right-side/left-side, upper/lower, front/back)     R side upper 2. ONSET: "When did the toothache start?"  (e.g., hours, days)      About 2-3 days 3. SEVERITY: "How bad is the toothache?"  (Scale 1-10; mild, moderate or severe)   - MILD (1-3): doesn't interfere with chewing    - MODERATE (4-7): interferes with chewing, interferes with normal activities, awakens from sleep     - SEVERE (8-10): unable to eat, unable to do any normal activities, excruciating pain        9 4. SWELLING: "Is there any visible swelling of your face?"     yes 5. OTHER SYMPTOMS: "Do you have any other symptoms?" (e.g., fever)     denies  Protocols  used: Toothache-A-AH

## 2024-01-23 NOTE — Progress Notes (Signed)
 Virtual Visit via Video Note  I connected with Jason Romero on 01/23/24 at  1:40 PM EDT by a video enabled telemedicine application and verified that I am speaking with the correct person using two identifiers.  Patient Location: Home Provider Location: Office/Clinic  I discussed the limitations, risks, security, and privacy concerns of performing an evaluation and management service by video and the availability of in person appointments. I also discussed with the patient that there may be a patient responsible charge related to this service. The patient expressed understanding and agreed to proceed.  Subjective: PCP: Jason Matar, MD  Chief Complaint  Patient presents with   Dental Pain    Toothache    Jason Romero has been evaluated today for an acute visit through video encounter with chief complaint of dental pain.  He endorses discomfort along the right upper jawline with tenderness over the maxillary sinus.  Pain radiates to the right ear.  Denies fever/chills.  He has called a dentist to schedule appointment but cannot be seen until next month.  He is currently completing warm salt water rinses.   ROS: Per HPI  Current Outpatient Medications:    amoxicillin-clavulanate (AUGMENTIN) 875-125 MG tablet, Take 1 tablet by mouth 2 (two) times daily for 7 days., Disp: 14 tablet, Rfl: 0   acetaminophen (TYLENOL) 325 MG tablet, Take 2 tablets (650 mg total) by mouth every 4 (four) hours as needed for mild pain (or temp > 37.5 C (99.5 F))., Disp: , Rfl:    albuterol (PROAIR HFA) 108 (90 Base) MCG/ACT inhaler, Inhale 2 puffs into the lungs every 6 (six) hours as needed for wheezing or shortness of breath., Disp: 18 g, Rfl: 2   amLODipine (NORVASC) 10 MG tablet, Take 1 tablet (10 mg total) by mouth daily., Disp: 90 tablet, Rfl: 1   aspirin EC 81 MG tablet, Take 1 tablet (81 mg total) by mouth daily. Swallow whole., Disp: 30 tablet, Rfl: 12   atorvastatin (LIPITOR) 20 MG tablet, Take  1 tablet (20 mg total) by mouth daily., Disp: 90 tablet, Rfl: 1   baclofen (LIORESAL) 20 MG tablet, Take 1 tablet (20 mg total) by mouth 3 (three) times daily., Disp: 270 tablet, Rfl: 1   benzonatate (TESSALON PERLES) 100 MG capsule, Take 1 capsule (100 mg total) by mouth 3 (three) times daily as needed for cough., Disp: 20 capsule, Rfl: 0   Blood Pressure Monitoring (BLOOD PRESSURE KIT) DEVI, Use to monitor blood pressure, Disp: 1 each, Rfl: 0   hydrALAZINE (APRESOLINE) 50 MG tablet, Take 1 tablet (50 mg total) by mouth in the morning and at bedtime., Disp: 180 tablet, Rfl: 1   hydrOXYzine (VISTARIL) 50 MG capsule, Take 1 capsule (50 mg total) by mouth every 8 (eight) hours as needed for anxiety., Disp: 270 capsule, Rfl: 1   labetalol (NORMODYNE) 300 MG tablet, Take 1 tablet (300 mg total) by mouth 2 (two) times daily., Disp: 180 tablet, Rfl: 0   ondansetron (ZOFRAN) 4 MG tablet, Take 1 tablet (4 mg total) by mouth every 8 (eight) hours as needed for nausea or vomiting., Disp: 20 tablet, Rfl: 1   pantoprazole (PROTONIX) 40 MG tablet, Take 1 tablet (40 mg total) by mouth daily., Disp: 60 tablet, Rfl: 2   pregabalin (LYRICA) 150 MG capsule, Take 1 capsule (150 mg total) by mouth 2 (two) times daily., Disp: 60 capsule, Rfl: 4   senna (SENOKOT) 8.6 MG TABS tablet, Take 1 tablet (8.6 mg total) by  mouth 2 (two) times daily., Disp: 120 tablet, Rfl: 0   traZODone (DESYREL) 100 MG tablet, Take 1 tablet (100 mg total) by mouth at bedtime as needed for sleep., Disp: 90 tablet, Rfl: 2  Assessment and Plan:  Dental abscess Assessment & Plan: Evaluated today through video encounter for an acute visit in the setting of dental pain with concern for dental abscess.  He endorses pain along the right upper jawline and right maxillary sinus.  Pain radiates to the right ear.  Exam is limited given the nature of this encounter, however there is obvious right-sided facial swelling concerning for dental abscess.  He will  see a dentist early next month, but they stated they would not prescribe an antibiotic in the interim since they have not seen him previously. -Treatment options reviewed.  Augmentin x 7 days has been prescribed for her.  Antibiotic coverage.  Recommended completing routine saline rinses.  Recommend low threshold for ER presentation if symptoms worsen or fail to improve.  Follow Up Instructions: Return with PCP.   I discussed the assessment and treatment plan with the patient. The patient was provided an opportunity to ask questions, and all were answered. The patient agreed with the plan and demonstrated an understanding of the instructions.   The patient was advised to call back or seek an in-person evaluation if the symptoms worsen or if the condition fails to improve as anticipated.  The above assessment and management plan was discussed with the patient. The patient verbalized understanding of and has agreed to the management plan.   Billie Lade, MD

## 2024-01-23 NOTE — Assessment & Plan Note (Addendum)
 Evaluated today through video encounter for an acute visit in the setting of dental pain with concern for dental abscess.  He endorses pain along the right upper jawline and right maxillary sinus.  Pain radiates to the right ear.  Exam is limited given the nature of this encounter, however there is obvious right-sided facial swelling concerning for dental abscess.  He will see a dentist early next month, but they stated they would not prescribe an antibiotic in the interim since they have not seen him previously. -Treatment options reviewed.  Augmentin x 7 days has been prescribed for her.  Antibiotic coverage.  Recommended completing routine saline rinses.  Recommend low threshold for ER presentation if symptoms worsen or fail to improve.

## 2024-01-29 ENCOUNTER — Other Ambulatory Visit: Payer: Self-pay

## 2024-01-29 ENCOUNTER — Other Ambulatory Visit (HOSPITAL_COMMUNITY): Payer: Self-pay

## 2024-01-30 DIAGNOSIS — I6930 Unspecified sequelae of cerebral infarction: Secondary | ICD-10-CM | POA: Diagnosis not present

## 2024-02-04 DIAGNOSIS — I6930 Unspecified sequelae of cerebral infarction: Secondary | ICD-10-CM | POA: Diagnosis not present

## 2024-02-05 DIAGNOSIS — I6930 Unspecified sequelae of cerebral infarction: Secondary | ICD-10-CM | POA: Diagnosis not present

## 2024-02-06 DIAGNOSIS — I6930 Unspecified sequelae of cerebral infarction: Secondary | ICD-10-CM | POA: Diagnosis not present

## 2024-02-11 DIAGNOSIS — R32 Unspecified urinary incontinence: Secondary | ICD-10-CM | POA: Diagnosis not present

## 2024-02-11 DIAGNOSIS — R8290 Unspecified abnormal findings in urine: Secondary | ICD-10-CM | POA: Diagnosis not present

## 2024-02-11 DIAGNOSIS — G8191 Hemiplegia, unspecified affecting right dominant side: Secondary | ICD-10-CM | POA: Diagnosis not present

## 2024-02-12 ENCOUNTER — Other Ambulatory Visit (HOSPITAL_COMMUNITY): Payer: Self-pay

## 2024-02-14 ENCOUNTER — Other Ambulatory Visit: Payer: Self-pay

## 2024-02-14 ENCOUNTER — Other Ambulatory Visit (HOSPITAL_COMMUNITY): Payer: Self-pay

## 2024-02-14 DIAGNOSIS — I6930 Unspecified sequelae of cerebral infarction: Secondary | ICD-10-CM | POA: Diagnosis not present

## 2024-02-17 ENCOUNTER — Other Ambulatory Visit (HOSPITAL_COMMUNITY): Payer: Self-pay

## 2024-02-18 DIAGNOSIS — I6930 Unspecified sequelae of cerebral infarction: Secondary | ICD-10-CM | POA: Diagnosis not present

## 2024-02-19 DIAGNOSIS — I6930 Unspecified sequelae of cerebral infarction: Secondary | ICD-10-CM | POA: Diagnosis not present

## 2024-02-25 DIAGNOSIS — I6930 Unspecified sequelae of cerebral infarction: Secondary | ICD-10-CM | POA: Diagnosis not present

## 2024-02-26 DIAGNOSIS — I6930 Unspecified sequelae of cerebral infarction: Secondary | ICD-10-CM | POA: Diagnosis not present

## 2024-02-27 ENCOUNTER — Other Ambulatory Visit (HOSPITAL_COMMUNITY): Payer: Self-pay

## 2024-02-27 DIAGNOSIS — I6930 Unspecified sequelae of cerebral infarction: Secondary | ICD-10-CM | POA: Diagnosis not present

## 2024-02-28 DIAGNOSIS — I6930 Unspecified sequelae of cerebral infarction: Secondary | ICD-10-CM | POA: Diagnosis not present

## 2024-03-04 DIAGNOSIS — I6930 Unspecified sequelae of cerebral infarction: Secondary | ICD-10-CM | POA: Diagnosis not present

## 2024-03-05 ENCOUNTER — Encounter: Payer: Self-pay | Admitting: Internal Medicine

## 2024-03-05 ENCOUNTER — Ambulatory Visit: Payer: Medicaid Other | Attending: Internal Medicine | Admitting: Internal Medicine

## 2024-03-05 ENCOUNTER — Other Ambulatory Visit: Payer: Self-pay

## 2024-03-05 VITALS — BP 109/77 | HR 68 | Temp 97.7°F | Ht 74.0 in

## 2024-03-05 DIAGNOSIS — Z1211 Encounter for screening for malignant neoplasm of colon: Secondary | ICD-10-CM

## 2024-03-05 DIAGNOSIS — F411 Generalized anxiety disorder: Secondary | ICD-10-CM

## 2024-03-05 DIAGNOSIS — F17210 Nicotine dependence, cigarettes, uncomplicated: Secondary | ICD-10-CM | POA: Diagnosis not present

## 2024-03-05 DIAGNOSIS — Z2821 Immunization not carried out because of patient refusal: Secondary | ICD-10-CM

## 2024-03-05 DIAGNOSIS — I6930 Unspecified sequelae of cerebral infarction: Secondary | ICD-10-CM | POA: Diagnosis not present

## 2024-03-05 DIAGNOSIS — Z720 Tobacco use: Secondary | ICD-10-CM

## 2024-03-05 DIAGNOSIS — I10 Essential (primary) hypertension: Secondary | ICD-10-CM

## 2024-03-05 DIAGNOSIS — G8929 Other chronic pain: Secondary | ICD-10-CM

## 2024-03-05 DIAGNOSIS — J432 Centrilobular emphysema: Secondary | ICD-10-CM

## 2024-03-05 DIAGNOSIS — M545 Low back pain, unspecified: Secondary | ICD-10-CM

## 2024-03-05 MED ORDER — LABETALOL HCL 300 MG PO TABS
300.0000 mg | ORAL_TABLET | Freq: Two times a day (BID) | ORAL | 1 refills | Status: DC
  Filled 2024-03-05 – 2024-03-18 (×2): qty 180, 90d supply, fill #0
  Filled 2024-06-10: qty 180, 90d supply, fill #1

## 2024-03-05 MED ORDER — HYDRALAZINE HCL 50 MG PO TABS
50.0000 mg | ORAL_TABLET | Freq: Two times a day (BID) | ORAL | 1 refills | Status: DC
  Filled 2024-03-05 – 2024-05-27 (×2): qty 180, 90d supply, fill #0
  Filled 2024-08-24: qty 180, 90d supply, fill #1

## 2024-03-05 MED ORDER — LABETALOL HCL 300 MG PO TABS
300.0000 mg | ORAL_TABLET | Freq: Two times a day (BID) | ORAL | 1 refills | Status: DC
  Filled 2024-03-05: qty 180, 90d supply, fill #0

## 2024-03-05 MED ORDER — HYDROXYZINE PAMOATE 50 MG PO CAPS
50.0000 mg | ORAL_CAPSULE | Freq: Three times a day (TID) | ORAL | 1 refills | Status: DC | PRN
  Filled 2024-03-05: qty 270, 90d supply, fill #0
  Filled 2024-03-18: qty 90, 30d supply, fill #0
  Filled 2024-04-14: qty 90, 30d supply, fill #1
  Filled 2024-05-13: qty 90, 30d supply, fill #2
  Filled 2024-06-10: qty 90, 30d supply, fill #3
  Filled 2024-07-09 – 2024-07-10 (×2): qty 90, 30d supply, fill #4
  Filled 2024-08-13: qty 90, 30d supply, fill #5
  Filled 2024-08-13: qty 90, 30d supply, fill #0

## 2024-03-05 MED ORDER — ATORVASTATIN CALCIUM 20 MG PO TABS
20.0000 mg | ORAL_TABLET | Freq: Every day | ORAL | 1 refills | Status: DC
  Filled 2024-03-05 – 2024-05-25 (×2): qty 90, 90d supply, fill #0
  Filled 2024-08-24: qty 90, 90d supply, fill #1

## 2024-03-05 MED ORDER — ALBUTEROL SULFATE HFA 108 (90 BASE) MCG/ACT IN AERS
2.0000 | INHALATION_SPRAY | Freq: Four times a day (QID) | RESPIRATORY_TRACT | 2 refills | Status: DC | PRN
Start: 2024-03-05 — End: 2024-07-01
  Filled 2024-03-05 – 2024-03-13 (×2): qty 18, 25d supply, fill #0
  Filled 2024-04-28: qty 18, 25d supply, fill #1
  Filled 2024-05-25: qty 18, 25d supply, fill #0

## 2024-03-05 MED ORDER — AMLODIPINE BESYLATE 10 MG PO TABS
10.0000 mg | ORAL_TABLET | Freq: Every day | ORAL | 1 refills | Status: DC
  Filled 2024-03-05 – 2024-05-25 (×2): qty 90, 90d supply, fill #0
  Filled 2024-08-24: qty 90, 90d supply, fill #1

## 2024-03-05 NOTE — Progress Notes (Signed)
 Patient ID: Jason Romero, male    DOB: 24-Oct-1971  MRN: [id number]  CC: Hypertension (HTN f/u. Med refill. /No questions / concerns/)   Subjective: Jason Romero is a 53 y.o. male who presents for chronic ds management. Wife, Jason Romero is with him and provides some of the hx.  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  Also on atorvastatin  20 mg daily. -Patient with dense right hemiparesis.  He takes baclofen  for spasms that he gets on his right side and Lyrica  for pain on the right side and in the lower back.  Not able to ambulate.  He is in wheelchair.  Wife wanting to know if there is anything else that he can use for the back pain.  He sits leaning forward most of the day and it hurts his lower back.  However patient states that he does not sit leaning forward most of the times but was noted doing it today at this visit No falls No problems swallowing   Tob dep:  still smoking 1/2 pk a day.  Not ready to quit.  COPD: Reports breathing is okay.  Uses Albuterol  PRN but not every day.  Needing refills on some of his medications.  HM: Due for colon cancer screening.  Prefers Cologuard test.  Declines shingles vaccine and Tdap. Patient Active Problem List   Diagnosis Date Noted   Dental abscess 09/27/2021   Chronic hepatitis C without hepatic coma (HCC) 02/28/2021   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) 02/14/2021   Hyperlipidemia 02/14/2021   Visual changes 02/14/2021   Obesity 02/12/2021   Back pain 02/12/2021   Depression 01/26/2021   Spastic hemiplegia affecting nondominant side (HCC)    Dysphagia due to recent stroke 09/20/2020    History of intracranial hemorrhage 09/20/2020   History of stroke with residual deficit 09/11/2020   Sleep apnea 09/06/2016   Tobacco abuse 09/06/2016   Unintentional weight loss 03/22/2016   Essential hypertension 02/12/2016   Anxiety 02/12/2016   Migraines 02/12/2016   COPD (chronic obstructive pulmonary disease) (HCC) 02/01/2016   Gastroesophageal reflux disease without esophagitis 02/01/2016     Current Outpatient Medications on File Prior to Visit  Medication Sig Dispense Refill   acetaminophen  (TYLENOL ) 325 MG tablet Take 2 tablets (650 mg total) by mouth every 4 (four) hours as needed for mild pain (or temp > 37.5 C (99.5 F)).     baclofen  (LIORESAL ) 20 MG tablet Take 1 tablet (20 mg total) by mouth 3 (three) times daily. 270 tablet 1   pantoprazole  (PROTONIX ) 40 MG tablet Take 1 tablet (40 mg total) by mouth daily. 60 tablet 2   pregabalin  (LYRICA ) 150 MG capsule Take 1 capsule (150 mg total) by mouth 2 (two) times daily. 60 capsule 4   traZODone  (DESYREL ) 100 MG tablet Take 1 tablet (100 mg total) by mouth at bedtime as needed for sleep. 90 tablet 2   aspirin  EC 81 MG tablet Take 1 tablet (81 mg total) by mouth daily. Swallow whole. (Patient not taking: Reported on 03/05/2024) 30 tablet 12   Blood Pressure Monitoring (BLOOD PRESSURE KIT) DEVI Use to monitor blood pressure (Patient not taking: Reported on 03/05/2024) 1 each  0   No current facility-administered medications on file prior to visit.    Allergies  Allergen Reactions   Hydrocodone Hives   Ibuprofen Other (See Comments)    Pt can't take this medication because it interacts with other medications that he is taking.     Social History   Socioeconomic History   Marital status: Married    Spouse name: Not on file   Number of children: Not on file   Years of education: Not on file   Highest education level: Not on file  Occupational History   Not on file  Tobacco Use   Smoking status: Every Day    Current  packs/day: 1.00    Types: Cigarettes   Smokeless tobacco: Never  Vaping Use   Vaping status: Never Used  Substance and Sexual Activity   Alcohol use: Not Currently    Alcohol/week: 0.0 standard drinks of alcohol   Drug use: Yes    Types: Marijuana    Comment: OCC   Sexual activity: Not Currently  Other Topics Concern   Not on file  Social History Narrative   Left handed   Social Drivers of Health   Financial Resource Strain: Medium Risk (11/08/2021)   Overall Financial Resource Strain (CARDIA)    Difficulty of Paying Living Expenses: Somewhat hard  Food Insecurity: No Food Insecurity (11/04/2023)   Hunger Vital Sign    Worried About Running Out of Food in the Last Year: Never true    Ran Out of Food in the Last Year: Never true  Transportation Needs: No Transportation Needs (11/04/2023)   PRAPARE - Administrator, Civil Service (Medical): No    Lack of Transportation (Non-Medical): No  Physical Activity: Inactive (11/04/2023)   Exercise Vital Sign    Days of Exercise per Week: 0 days    Minutes of Exercise per Session: 0 min  Stress: No Stress Concern Present (11/04/2023)   [employer] of Occupational Health - Occupational Stress Questionnaire    Feeling of Stress : Not at all  Social Connections: Moderately Isolated (11/04/2023)   Social Connection and Isolation Panel [NHANES]    Frequency of Communication with Friends and Family: More than three times a week    Frequency of Social Gatherings with Friends and Family: Twice a week    Attends Religious Services: Never    Database administrator or Organizations: No    Attends Banker Meetings: Never    Marital Status: Married  Catering manager Violence: Not At Risk (11/04/2023)   Humiliation, Afraid, Rape, and Kick questionnaire    Fear of Current or Ex-Partner: No    Emotionally Abused: No    Physically Abused: No    Sexually Abused: No    Family History  Problem Relation Age of Onset    Alcoholism Other    Arthritis Other    Breast cancer Other    Heart disease Mother     Past Surgical History:  Procedure Laterality Date   CHOLECYSTECTOMY      ROS: Review of Systems Negative except as stated above  PHYSICAL EXAM: BP 109/77 (BP Location: Left Arm, Patient Position: Sitting, Cuff Size: Large)   Pulse 68   Temp 97.7 F (36.5 C) (Oral)   Ht 6\' 2"  (1.88 m)   SpO2 96%   BMI 29.15 kg/m   Wt Readings from Last 3 Encounters:  05/02/21 227 lb (103 kg)  10/19/20 227 lb 9.6 oz (103.2 kg)  09/20/20 240  lb 15.4 oz (109.3 kg)    Physical Exam   General appearance - alert, well appearing, older Caucasian male sitting in wheelchair and in no distress Mental status -patient answers most questions appropriately.  Has a little bit of dysarthria.   Chest -breath sounds decreased due to poor inspiratory effort Heart - normal rate, regular rhythm, normal S1, S2, no murmurs, rubs, clicks or gallops Neurological -dense right hemiparesis Extremities -no lower extremity edema.     Latest Ref Rng & Units 11/04/2023    4:38 PM 02/26/2023    2:16 PM 11/13/2022    3:43 PM  CMP  Glucose 70 - 99 mg/dL 94  70  72   BUN 6 - 24 mg/dL 11  13  12    Creatinine 0.76 - 1.27 mg/dL 1.61  0.96  0.45   Sodium 134 - 144 mmol/L 140  142  140   Potassium 3.5 - 5.2 mmol/L 4.6  4.2  4.2   Chloride 96 - 106 mmol/L 103  105  101   CO2 20 - 29 mmol/L 22  22  25    Calcium  8.7 - 10.2 mg/dL 9.6  9.2  9.7   Total Protein 6.0 - 8.5 g/dL 7.2  7.0    Total Bilirubin 0.0 - 1.2 mg/dL 0.3  0.3    Alkaline Phos 44 - 121 IU/L 120  96    AST 0 - 40 IU/L 11  13    ALT 0 - 44 IU/L 10  15     Lipid Panel     Component Value Date/Time   CHOL 84 (L) 02/26/2023 1416   TRIG 78 02/26/2023 1416   HDL 28 (L) 02/26/2023 1416   CHOLHDL 3.0 02/26/2023 1416   CHOLHDL 3.1 09/12/2020 0513   VLDL 21 09/12/2020 0513   LDLCALC 40 02/26/2023 1416    CBC    Component Value Date/Time   WBC 11.5 (H) 11/04/2023  1638   WBC 9.3 03/15/2022 1511   RBC 4.75 11/04/2023 1638   RBC 4.35 03/15/2022 1511   HGB 12.9 (L) 11/04/2023 1638   HCT 40.4 11/04/2023 1638   PLT 372 11/04/2023 1638   MCV 85 11/04/2023 1638   MCV 84 03/10/2012 1336   MCH 27.2 11/04/2023 1638   MCH 29.2 03/15/2022 1511   MCHC 31.9 11/04/2023 1638   MCHC 33.3 03/15/2022 1511   RDW 15.2 11/04/2023 1638   RDW 14.4 03/10/2012 1336   LYMPHSABS 2.2 02/26/2023 1416   LYMPHSABS 2.3 03/10/2012 1336   MONOABS 0.5 03/15/2022 1511   MONOABS 0.8 03/10/2012 1336   EOSABS 0.2 02/26/2023 1416   EOSABS 0.2 03/10/2012 1336   BASOSABS 0.1 02/26/2023 1416   BASOSABS 0.1 03/10/2012 1336    ASSESSMENT AND PLAN: 1. Essential hypertension At goal.  Continue current medications - amLODipine  (NORVASC ) 10 MG tablet; Take 1 tablet (10 mg total) by mouth daily.  Dispense: 90 tablet; Refill: 1 - labetalol  (NORMODYNE ) 300 MG tablet; Take 1 tablet (300 mg total) by mouth 2 (two) times daily.  Dispense: 180 tablet; Refill: 1 - hydrALAZINE  (APRESOLINE ) 50 MG tablet; Take 1 tablet (50 mg total) by mouth in the morning and at bedtime.  Dispense: 180 tablet; Refill: 1  2. History of CVA with residual deficit Continue current blood pressure medications, atorvastatin  20 mg daily and aspirin  - amLODipine  (NORVASC ) 10 MG tablet; Take 1 tablet (10 mg total) by mouth daily.  Dispense: 90 tablet; Refill: 1 - atorvastatin  (LIPITOR) 20 MG tablet; Take 1  tablet (20 mg total) by mouth daily.  Dispense: 90 tablet; Refill: 1 - hydrALAZINE  (APRESOLINE ) 50 MG tablet; Take 1 tablet (50 mg total) by mouth in the morning and at bedtime.  Dispense: 180 tablet; Refill: 1   3. GAD (generalized anxiety disorder) Stable. - hydrOXYzine  (VISTARIL ) 50 MG capsule; Take 1 capsule (50 mg total) by mouth every 8 (eight) hours as needed for anxiety.  Dispense: 270 capsule; Refill: 1  4. Tobacco abuse (Primary) Strongly encouraged to discontinue smoking.  Patient not ready to give a  trial of quitting  5. Centrilobular emphysema (HCC) Stable.  Strongly encouraged to discontinue smoking - albuterol  (PROAIR  HFA) 108 (90 Base) MCG/ACT inhaler; Inhale 2 puffs into the lungs every 6 (six) hours as needed for wheezing or shortness of breath.  Dispense: 18 g; Refill: 2  6. Chronic bilateral low back pain without sciatica Patient to continue the Lyrica  and baclofen .  Can try over-the-counter Voltaren gel or IcyHot rub.  Use heating pad as needed Discussed and encourage good posturing when sitting.   7. Screening for colon cancer - Cologuard  8. Herpes zoster vaccination declined   9. Tetanus, diphtheria, and acellular pertussis (Tdap) vaccination declined     Patient was given the opportunity to ask questions.  Patient verbalized understanding of the plan and was able to repeat key elements of the plan.   This documentation was completed using Paediatric nurse.  Any transcriptional errors are unintentional.  Orders Placed This Encounter  Procedures   Cologuard     Requested Prescriptions   Signed Prescriptions Disp Refills   amLODipine  (NORVASC ) 10 MG tablet 90 tablet 1    Sig: Take 1 tablet (10 mg total) by mouth daily.   atorvastatin  (LIPITOR) 20 MG tablet 90 tablet 1    Sig: Take 1 tablet (20 mg total) by mouth daily.   hydrALAZINE  (APRESOLINE ) 50 MG tablet 180 tablet 1    Sig: Take 1 tablet (50 mg total) by mouth in the morning and at bedtime.   hydrOXYzine  (VISTARIL ) 50 MG capsule 270 capsule 1    Sig: Take 1 capsule (50 mg total) by mouth every 8 (eight) hours as needed for anxiety.   albuterol  (PROAIR  HFA) 108 (90 Base) MCG/ACT inhaler 18 g 2    Sig: Inhale 2 puffs into the lungs every 6 (six) hours as needed for wheezing or shortness of breath.   labetalol  (NORMODYNE ) 300 MG tablet 180 tablet 1    Sig: Take 1 tablet (300 mg total) by mouth 2 (two) times daily.    Return in about 4 months (around 07/05/2024).  Concetta Dee,  MD, FACP

## 2024-03-05 NOTE — Patient Instructions (Signed)
 Purchase Voltaren gel over-the-counter and use it on the lower back 2-3 times a day as needed. Be mindful of your posturing when sitting in the chair.  Make sure that your back is supported.  Avoid sitting leaning forward.

## 2024-03-06 ENCOUNTER — Other Ambulatory Visit: Payer: Self-pay

## 2024-03-10 DIAGNOSIS — I6930 Unspecified sequelae of cerebral infarction: Secondary | ICD-10-CM | POA: Diagnosis not present

## 2024-03-11 DIAGNOSIS — I6930 Unspecified sequelae of cerebral infarction: Secondary | ICD-10-CM | POA: Diagnosis not present

## 2024-03-12 DIAGNOSIS — G8191 Hemiplegia, unspecified affecting right dominant side: Secondary | ICD-10-CM | POA: Diagnosis not present

## 2024-03-12 DIAGNOSIS — R32 Unspecified urinary incontinence: Secondary | ICD-10-CM | POA: Diagnosis not present

## 2024-03-12 DIAGNOSIS — R8290 Unspecified abnormal findings in urine: Secondary | ICD-10-CM | POA: Diagnosis not present

## 2024-03-13 ENCOUNTER — Other Ambulatory Visit (HOSPITAL_COMMUNITY): Payer: Self-pay

## 2024-03-13 ENCOUNTER — Other Ambulatory Visit: Payer: Self-pay

## 2024-03-13 DIAGNOSIS — I6930 Unspecified sequelae of cerebral infarction: Secondary | ICD-10-CM | POA: Diagnosis not present

## 2024-03-18 ENCOUNTER — Other Ambulatory Visit (HOSPITAL_COMMUNITY): Payer: Self-pay

## 2024-03-18 ENCOUNTER — Other Ambulatory Visit: Payer: Self-pay

## 2024-03-18 DIAGNOSIS — I6930 Unspecified sequelae of cerebral infarction: Secondary | ICD-10-CM | POA: Diagnosis not present

## 2024-03-20 ENCOUNTER — Other Ambulatory Visit (HOSPITAL_COMMUNITY): Payer: Self-pay

## 2024-03-23 DIAGNOSIS — I6930 Unspecified sequelae of cerebral infarction: Secondary | ICD-10-CM | POA: Diagnosis not present

## 2024-03-24 DIAGNOSIS — I6930 Unspecified sequelae of cerebral infarction: Secondary | ICD-10-CM | POA: Diagnosis not present

## 2024-03-25 DIAGNOSIS — I6930 Unspecified sequelae of cerebral infarction: Secondary | ICD-10-CM | POA: Diagnosis not present

## 2024-03-26 DIAGNOSIS — I6930 Unspecified sequelae of cerebral infarction: Secondary | ICD-10-CM | POA: Diagnosis not present

## 2024-03-27 DIAGNOSIS — I6930 Unspecified sequelae of cerebral infarction: Secondary | ICD-10-CM | POA: Diagnosis not present

## 2024-03-31 DIAGNOSIS — I6930 Unspecified sequelae of cerebral infarction: Secondary | ICD-10-CM | POA: Diagnosis not present

## 2024-04-01 DIAGNOSIS — I6930 Unspecified sequelae of cerebral infarction: Secondary | ICD-10-CM | POA: Diagnosis not present

## 2024-04-02 DIAGNOSIS — I6930 Unspecified sequelae of cerebral infarction: Secondary | ICD-10-CM | POA: Diagnosis not present

## 2024-04-03 DIAGNOSIS — I6930 Unspecified sequelae of cerebral infarction: Secondary | ICD-10-CM | POA: Diagnosis not present

## 2024-04-07 ENCOUNTER — Other Ambulatory Visit (HOSPITAL_COMMUNITY): Payer: Self-pay

## 2024-04-07 DIAGNOSIS — I6930 Unspecified sequelae of cerebral infarction: Secondary | ICD-10-CM | POA: Diagnosis not present

## 2024-04-08 DIAGNOSIS — I6930 Unspecified sequelae of cerebral infarction: Secondary | ICD-10-CM | POA: Diagnosis not present

## 2024-04-09 DIAGNOSIS — I6930 Unspecified sequelae of cerebral infarction: Secondary | ICD-10-CM | POA: Diagnosis not present

## 2024-04-10 DIAGNOSIS — I6930 Unspecified sequelae of cerebral infarction: Secondary | ICD-10-CM | POA: Diagnosis not present

## 2024-04-13 ENCOUNTER — Other Ambulatory Visit (HOSPITAL_COMMUNITY): Payer: Self-pay

## 2024-04-13 DIAGNOSIS — I6930 Unspecified sequelae of cerebral infarction: Secondary | ICD-10-CM | POA: Diagnosis not present

## 2024-04-14 ENCOUNTER — Other Ambulatory Visit: Payer: Self-pay

## 2024-04-14 ENCOUNTER — Other Ambulatory Visit (HOSPITAL_COMMUNITY): Payer: Self-pay

## 2024-04-14 DIAGNOSIS — G8191 Hemiplegia, unspecified affecting right dominant side: Secondary | ICD-10-CM | POA: Diagnosis not present

## 2024-04-14 DIAGNOSIS — R8290 Unspecified abnormal findings in urine: Secondary | ICD-10-CM | POA: Diagnosis not present

## 2024-04-14 DIAGNOSIS — I6930 Unspecified sequelae of cerebral infarction: Secondary | ICD-10-CM | POA: Diagnosis not present

## 2024-04-14 DIAGNOSIS — R32 Unspecified urinary incontinence: Secondary | ICD-10-CM | POA: Diagnosis not present

## 2024-04-15 DIAGNOSIS — I6930 Unspecified sequelae of cerebral infarction: Secondary | ICD-10-CM | POA: Diagnosis not present

## 2024-04-16 DIAGNOSIS — I6930 Unspecified sequelae of cerebral infarction: Secondary | ICD-10-CM | POA: Diagnosis not present

## 2024-04-17 DIAGNOSIS — I6930 Unspecified sequelae of cerebral infarction: Secondary | ICD-10-CM | POA: Diagnosis not present

## 2024-04-22 DIAGNOSIS — I6930 Unspecified sequelae of cerebral infarction: Secondary | ICD-10-CM | POA: Diagnosis not present

## 2024-04-23 DIAGNOSIS — I6930 Unspecified sequelae of cerebral infarction: Secondary | ICD-10-CM | POA: Diagnosis not present

## 2024-04-24 DIAGNOSIS — I6930 Unspecified sequelae of cerebral infarction: Secondary | ICD-10-CM | POA: Diagnosis not present

## 2024-04-27 DIAGNOSIS — I6930 Unspecified sequelae of cerebral infarction: Secondary | ICD-10-CM | POA: Diagnosis not present

## 2024-04-28 ENCOUNTER — Other Ambulatory Visit (HOSPITAL_COMMUNITY): Payer: Self-pay

## 2024-04-28 ENCOUNTER — Telehealth: Payer: Self-pay | Admitting: Internal Medicine

## 2024-04-28 DIAGNOSIS — I6930 Unspecified sequelae of cerebral infarction: Secondary | ICD-10-CM | POA: Diagnosis not present

## 2024-04-28 NOTE — Telephone Encounter (Signed)
 Copied from CRM [id number]. Topic: General - Other >> Apr 28, 2024  9:09 AM Rennis Case wrote:  Reason for CRM: Pt's wife calling for update for a CAPS form, advised of turnaround time. Wife verbalized understanding.

## 2024-04-29 DIAGNOSIS — I6930 Unspecified sequelae of cerebral infarction: Secondary | ICD-10-CM | POA: Diagnosis not present

## 2024-04-29 NOTE — Telephone Encounter (Signed)
 I do. I have it and need to ask you a few questions before sending it off.

## 2024-04-30 DIAGNOSIS — I6930 Unspecified sequelae of cerebral infarction: Secondary | ICD-10-CM | POA: Diagnosis not present

## 2024-04-30 NOTE — Telephone Encounter (Signed)
 Completed CAP referral efaxed to [email or web address]

## 2024-04-30 NOTE — Telephone Encounter (Signed)
 I see the note that patient's wife called back and was informed that the CAP referral was sent today.    I had called [name]  again and left a message stating that the CAP referral was sent today

## 2024-04-30 NOTE — Telephone Encounter (Signed)
 I tried to reach patient's wife to let her know the CAP request has been faxed to [facility] but her phone just rings, I can't leave a message.

## 2024-04-30 NOTE — Telephone Encounter (Signed)
 Patients wife called back in regard to this call, states she is sorry she did not have voicemail set up yet. I was able to relay the message about her husbands CAP form being faxed.

## 2024-05-04 DIAGNOSIS — I6930 Unspecified sequelae of cerebral infarction: Secondary | ICD-10-CM | POA: Diagnosis not present

## 2024-05-05 DIAGNOSIS — I6930 Unspecified sequelae of cerebral infarction: Secondary | ICD-10-CM | POA: Diagnosis not present

## 2024-05-06 DIAGNOSIS — I6930 Unspecified sequelae of cerebral infarction: Secondary | ICD-10-CM | POA: Diagnosis not present

## 2024-05-07 DIAGNOSIS — I6930 Unspecified sequelae of cerebral infarction: Secondary | ICD-10-CM | POA: Diagnosis not present

## 2024-05-12 DIAGNOSIS — I6930 Unspecified sequelae of cerebral infarction: Secondary | ICD-10-CM | POA: Diagnosis not present

## 2024-05-12 DIAGNOSIS — G8191 Hemiplegia, unspecified affecting right dominant side: Secondary | ICD-10-CM | POA: Diagnosis not present

## 2024-05-12 DIAGNOSIS — R32 Unspecified urinary incontinence: Secondary | ICD-10-CM | POA: Diagnosis not present

## 2024-05-12 DIAGNOSIS — R8290 Unspecified abnormal findings in urine: Secondary | ICD-10-CM | POA: Diagnosis not present

## 2024-05-13 ENCOUNTER — Other Ambulatory Visit: Payer: Self-pay

## 2024-05-13 ENCOUNTER — Other Ambulatory Visit (HOSPITAL_COMMUNITY): Payer: Self-pay

## 2024-05-13 ENCOUNTER — Other Ambulatory Visit: Payer: Self-pay | Admitting: Internal Medicine

## 2024-05-13 DIAGNOSIS — I6930 Unspecified sequelae of cerebral infarction: Secondary | ICD-10-CM | POA: Diagnosis not present

## 2024-05-13 MED ORDER — BACLOFEN 20 MG PO TABS
20.0000 mg | ORAL_TABLET | Freq: Three times a day (TID) | ORAL | 1 refills | Status: DC
  Filled 2024-05-13: qty 90, 30d supply, fill #0
  Filled 2024-06-10: qty 90, 30d supply, fill #1
  Filled 2024-07-09 – 2024-07-10 (×2): qty 90, 30d supply, fill #2
  Filled 2024-08-06 – 2024-08-07 (×2): qty 90, 30d supply, fill #3
  Filled 2024-09-14: qty 90, 30d supply, fill #4
  Filled 2024-10-12: qty 90, 30d supply, fill #5

## 2024-05-14 DIAGNOSIS — I6930 Unspecified sequelae of cerebral infarction: Secondary | ICD-10-CM | POA: Diagnosis not present

## 2024-05-15 DIAGNOSIS — I6930 Unspecified sequelae of cerebral infarction: Secondary | ICD-10-CM | POA: Diagnosis not present

## 2024-05-19 DIAGNOSIS — I6930 Unspecified sequelae of cerebral infarction: Secondary | ICD-10-CM | POA: Diagnosis not present

## 2024-05-20 DIAGNOSIS — I6930 Unspecified sequelae of cerebral infarction: Secondary | ICD-10-CM | POA: Diagnosis not present

## 2024-05-21 DIAGNOSIS — I6930 Unspecified sequelae of cerebral infarction: Secondary | ICD-10-CM | POA: Diagnosis not present

## 2024-05-22 DIAGNOSIS — I6930 Unspecified sequelae of cerebral infarction: Secondary | ICD-10-CM | POA: Diagnosis not present

## 2024-05-25 ENCOUNTER — Other Ambulatory Visit (HOSPITAL_COMMUNITY): Payer: Self-pay

## 2024-05-25 ENCOUNTER — Other Ambulatory Visit: Payer: Self-pay

## 2024-05-25 DIAGNOSIS — I6930 Unspecified sequelae of cerebral infarction: Secondary | ICD-10-CM | POA: Diagnosis not present

## 2024-05-26 DIAGNOSIS — I6930 Unspecified sequelae of cerebral infarction: Secondary | ICD-10-CM | POA: Diagnosis not present

## 2024-05-27 ENCOUNTER — Other Ambulatory Visit: Payer: Self-pay

## 2024-05-27 ENCOUNTER — Other Ambulatory Visit (HOSPITAL_COMMUNITY): Payer: Self-pay

## 2024-05-27 DIAGNOSIS — I6930 Unspecified sequelae of cerebral infarction: Secondary | ICD-10-CM | POA: Diagnosis not present

## 2024-05-28 DIAGNOSIS — I6930 Unspecified sequelae of cerebral infarction: Secondary | ICD-10-CM | POA: Diagnosis not present

## 2024-05-29 DIAGNOSIS — I6930 Unspecified sequelae of cerebral infarction: Secondary | ICD-10-CM | POA: Diagnosis not present

## 2024-06-01 DIAGNOSIS — I6930 Unspecified sequelae of cerebral infarction: Secondary | ICD-10-CM | POA: Diagnosis not present

## 2024-06-03 DIAGNOSIS — I6930 Unspecified sequelae of cerebral infarction: Secondary | ICD-10-CM | POA: Diagnosis not present

## 2024-06-04 DIAGNOSIS — I6930 Unspecified sequelae of cerebral infarction: Secondary | ICD-10-CM | POA: Diagnosis not present

## 2024-06-05 DIAGNOSIS — I6930 Unspecified sequelae of cerebral infarction: Secondary | ICD-10-CM | POA: Diagnosis not present

## 2024-06-08 DIAGNOSIS — I6930 Unspecified sequelae of cerebral infarction: Secondary | ICD-10-CM | POA: Diagnosis not present

## 2024-06-10 ENCOUNTER — Other Ambulatory Visit (HOSPITAL_COMMUNITY): Payer: Self-pay

## 2024-06-10 ENCOUNTER — Other Ambulatory Visit: Payer: Self-pay | Admitting: Internal Medicine

## 2024-06-10 DIAGNOSIS — I6930 Unspecified sequelae of cerebral infarction: Secondary | ICD-10-CM

## 2024-06-10 MED ORDER — PREGABALIN 150 MG PO CAPS
150.0000 mg | ORAL_CAPSULE | Freq: Two times a day (BID) | ORAL | 6 refills | Status: AC
  Filled 2024-06-10 (×2): qty 60, 30d supply, fill #0
  Filled 2024-07-09 – 2024-07-10 (×2): qty 60, 30d supply, fill #1
  Filled 2024-08-06 – 2024-08-07 (×2): qty 60, 30d supply, fill #2
  Filled 2024-09-14: qty 60, 30d supply, fill #3
  Filled 2024-10-12: qty 60, 30d supply, fill #4
  Filled 2024-11-19: qty 60, 30d supply, fill #5

## 2024-06-11 ENCOUNTER — Other Ambulatory Visit (HOSPITAL_COMMUNITY): Payer: Self-pay

## 2024-06-11 ENCOUNTER — Other Ambulatory Visit: Payer: Self-pay

## 2024-06-11 DIAGNOSIS — I6930 Unspecified sequelae of cerebral infarction: Secondary | ICD-10-CM | POA: Diagnosis not present

## 2024-06-12 DIAGNOSIS — R32 Unspecified urinary incontinence: Secondary | ICD-10-CM | POA: Diagnosis not present

## 2024-06-12 DIAGNOSIS — G8191 Hemiplegia, unspecified affecting right dominant side: Secondary | ICD-10-CM | POA: Diagnosis not present

## 2024-06-12 DIAGNOSIS — I6930 Unspecified sequelae of cerebral infarction: Secondary | ICD-10-CM | POA: Diagnosis not present

## 2024-06-12 DIAGNOSIS — R8290 Unspecified abnormal findings in urine: Secondary | ICD-10-CM | POA: Diagnosis not present

## 2024-06-23 DIAGNOSIS — I6930 Unspecified sequelae of cerebral infarction: Secondary | ICD-10-CM | POA: Diagnosis not present

## 2024-06-24 DIAGNOSIS — I6930 Unspecified sequelae of cerebral infarction: Secondary | ICD-10-CM | POA: Diagnosis not present

## 2024-06-25 DIAGNOSIS — I6930 Unspecified sequelae of cerebral infarction: Secondary | ICD-10-CM | POA: Diagnosis not present

## 2024-06-26 DIAGNOSIS — I6930 Unspecified sequelae of cerebral infarction: Secondary | ICD-10-CM | POA: Diagnosis not present

## 2024-06-29 DIAGNOSIS — I6930 Unspecified sequelae of cerebral infarction: Secondary | ICD-10-CM | POA: Diagnosis not present

## 2024-06-30 DIAGNOSIS — I6930 Unspecified sequelae of cerebral infarction: Secondary | ICD-10-CM | POA: Diagnosis not present

## 2024-07-01 ENCOUNTER — Other Ambulatory Visit (HOSPITAL_COMMUNITY): Payer: Self-pay

## 2024-07-01 ENCOUNTER — Other Ambulatory Visit: Payer: Self-pay | Admitting: Internal Medicine

## 2024-07-01 ENCOUNTER — Other Ambulatory Visit: Payer: Self-pay

## 2024-07-01 DIAGNOSIS — I6930 Unspecified sequelae of cerebral infarction: Secondary | ICD-10-CM | POA: Diagnosis not present

## 2024-07-01 DIAGNOSIS — J432 Centrilobular emphysema: Secondary | ICD-10-CM

## 2024-07-01 MED ORDER — ALBUTEROL SULFATE HFA 108 (90 BASE) MCG/ACT IN AERS
2.0000 | INHALATION_SPRAY | Freq: Four times a day (QID) | RESPIRATORY_TRACT | 2 refills | Status: AC | PRN
  Filled 2024-07-01: qty 18, 25d supply, fill #0
  Filled 2024-08-03: qty 18, 25d supply, fill #1
  Filled 2024-09-02: qty 6.7, 25d supply, fill #2
  Filled 2024-10-12: qty 6.7, 25d supply, fill #3

## 2024-07-02 DIAGNOSIS — I6930 Unspecified sequelae of cerebral infarction: Secondary | ICD-10-CM | POA: Diagnosis not present

## 2024-07-03 ENCOUNTER — Telehealth: Payer: Self-pay | Admitting: Internal Medicine

## 2024-07-03 DIAGNOSIS — I6930 Unspecified sequelae of cerebral infarction: Secondary | ICD-10-CM | POA: Diagnosis not present

## 2024-07-03 NOTE — Telephone Encounter (Signed)
 Confirmed appt for 8/25

## 2024-07-06 ENCOUNTER — Other Ambulatory Visit: Payer: Self-pay

## 2024-07-06 ENCOUNTER — Ambulatory Visit: Attending: Internal Medicine | Admitting: Internal Medicine

## 2024-07-06 VITALS — BP 119/84 | HR 63 | Temp 97.6°F | Ht 74.0 in

## 2024-07-06 DIAGNOSIS — Z79899 Other long term (current) drug therapy: Secondary | ICD-10-CM

## 2024-07-06 DIAGNOSIS — F17200 Nicotine dependence, unspecified, uncomplicated: Secondary | ICD-10-CM | POA: Diagnosis not present

## 2024-07-06 DIAGNOSIS — Z125 Encounter for screening for malignant neoplasm of prostate: Secondary | ICD-10-CM | POA: Diagnosis not present

## 2024-07-06 DIAGNOSIS — Z1211 Encounter for screening for malignant neoplasm of colon: Secondary | ICD-10-CM

## 2024-07-06 DIAGNOSIS — I69353 Hemiplegia and hemiparesis following cerebral infarction affecting right non-dominant side: Secondary | ICD-10-CM | POA: Diagnosis not present

## 2024-07-06 DIAGNOSIS — I10 Essential (primary) hypertension: Secondary | ICD-10-CM

## 2024-07-06 DIAGNOSIS — G4700 Insomnia, unspecified: Secondary | ICD-10-CM | POA: Diagnosis not present

## 2024-07-06 DIAGNOSIS — Z720 Tobacco use: Secondary | ICD-10-CM

## 2024-07-06 DIAGNOSIS — Z993 Dependence on wheelchair: Secondary | ICD-10-CM

## 2024-07-06 MED ORDER — LABETALOL HCL 300 MG PO TABS
300.0000 mg | ORAL_TABLET | Freq: Two times a day (BID) | ORAL | 3 refills | Status: AC
  Filled 2024-07-06 – 2024-09-14 (×2): qty 180, 90d supply, fill #0
  Filled 2024-12-10: qty 180, 90d supply, fill #1

## 2024-07-06 MED ORDER — TRAZODONE HCL 100 MG PO TABS
100.0000 mg | ORAL_TABLET | Freq: Every evening | ORAL | 2 refills | Status: DC | PRN
  Filled 2024-07-06 – 2024-07-21 (×3): qty 90, 90d supply, fill #0

## 2024-07-06 NOTE — Patient Instructions (Signed)
 VISIT SUMMARY:  You had a follow-up visit today to check on your hypertension, hyperlipidemia, and history of stroke. You reported no new symptoms and are continuing with your current medications. We also discussed some general health maintenance items.  YOUR PLAN:  -HYPERTENSION: Hypertension, or high blood pressure, is being managed with your current medications. Please continue taking hydralazine  50 mg twice a day, labetalol  300 mg twice a day, and amlodipine  10 mg daily. A refill for labetalol  has been sent to your pharmacy.  -HYPERLIPIDEMIA: Hyperlipidemia, or high cholesterol, is being managed with atorvastatin . Please continue taking atorvastatin  20 mg daily.  -HISTORY OF CEREBROVASCULAR ACCIDENT (STROKE): You have a history of stroke but no new neurological symptoms. Please continue taking aspirin  as prescribed.  -PERIPHERAL EDEMA: Peripheral edema is swelling in your legs. To help reduce the swelling, please elevate your legs when possible.  -GENERAL HEALTH MAINTENANCE: You have not received the Cologuard kit for colon cancer screening, so it has been reordered. We also discussed getting a flu vaccine and a PSA screening for prostate cancer. Please obtain the flu vaccine at an outside pharmacy or at the clinic. A PSA screening has been ordered.  INSTRUCTIONS:  Please follow up with the following: 1. Obtain a flu vaccine at an outside pharmacy or at the clinic. 2. Perform blood tests including cholesterol, kidney, and liver function tests. 3. Perform PSA screening for prostate cancer. 4. A prescription has been sent to [facility] for a new wheelchair with a cushion.

## 2024-07-06 NOTE — Progress Notes (Signed)
 Patient ID: Jason Romero, male    DOB: 1971-01-03  MRN: [id number]  CC: Hypertension (HTN f/u. Med refills. /No questions / concerns/Yes to Tdap vax)   Subjective: Jason Romero is a 53 y.o. male who presents for chronic ds management. 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  History of Present Illness Jason Romero is a 53 year old male with hypertension, hyperlipidemia, and a history of stroke who presents for a four-month follow-up visit. He is accompanied by his wife, Jason Romero.  HTN/CVA: He continues to take his prescribed medications, which include aspirin , hydralazine  50 mg twice daily, labetalol  300 mg twice daily, amlodipine  10 mg daily, atorvastatin  20 mg daily, Lyrica , hydroxyzine , baclofen , and trazodone . His prescriptions are delivered to his home. He has no new symptoms since his last visit. No falls, swallowing difficulties, chest pain, shortness of breath, headaches, or dizziness. He maintains a good appetite and smokes cigarettes, expressing that he is not ready to quit at this time.  Wife is requesting that he get a new wheelchair.  He has had the current one for about 5 years and it is worn.  The rubber lining for the wheels intermittently come off.  Patient is unable to ambulate at all due to dense hemiplegia on the right side.  HM: He has not received the Cologuard kit that was ordered previously.  Will be due for flu vaccine this fall.    Patient Active Problem List   Diagnosis Date Noted   Dental abscess 09/27/2021   Chronic hepatitis C without hepatic coma (HCC) 02/28/2021   Abnormality of gait as  late effect of cerebrovascular accident (CVA) 02/14/2021   Hyperlipidemia 02/14/2021   Visual changes 02/14/2021   Obesity 02/12/2021   Back pain 02/12/2021   Depression 01/26/2021   Spastic hemiplegia affecting nondominant side (HCC)    Dysphagia due to recent stroke 09/20/2020   History of intracranial hemorrhage 09/20/2020   History of stroke with residual deficit 09/11/2020   Sleep apnea 09/06/2016   Tobacco abuse 09/06/2016   Unintentional weight loss 03/22/2016   Essential hypertension 02/12/2016   Anxiety 02/12/2016   Migraines 02/12/2016   COPD (chronic obstructive pulmonary disease) (HCC) 02/01/2016   Gastroesophageal reflux disease without esophagitis 02/01/2016     Current Outpatient Medications on File Prior to Visit  Medication Sig Dispense Refill   acetaminophen  (TYLENOL ) 325 MG tablet Take 2 tablets (650 mg total) by mouth every 4 (four) hours as needed for mild pain (or temp > 37.5 C (99.5 F)).     albuterol  (VENTOLIN  HFA) 108 (90 Base) MCG/ACT inhaler Inhale 2 puffs into the lungs every 6 (six) hours as needed for wheezing or shortness of breath. 18 g 2   amLODipine  (NORVASC ) 10 MG tablet Take 1 tablet (10 mg total) by mouth daily. 90 tablet 1   aspirin  EC 81 MG tablet Take 1 tablet (81 mg total) by mouth daily. Swallow whole. 30 tablet 12   atorvastatin  (LIPITOR) 20 MG tablet Take 1 tablet (20 mg total) by mouth daily. 90 tablet 1   baclofen  (LIORESAL ) 20 MG tablet Take 1 tablet (20 mg total) by mouth  3 (three) times daily. 270 tablet 1   Blood Pressure Monitoring (BLOOD PRESSURE KIT) DEVI Use to monitor blood pressure 1 each 0   hydrALAZINE  (APRESOLINE ) 50 MG tablet Take 1 tablet (50 mg total) by mouth in the morning and at bedtime. 180 tablet 1   hydrOXYzine  (VISTARIL ) 50 MG capsule Take 1 capsule (50 mg total) by mouth every 8 (eight) hours as needed for anxiety. 270 capsule 1   labetalol  (NORMODYNE ) 300 MG tablet Take 1 tablet (300 mg total) by mouth 2 (two) times  daily. 180 tablet 1   pregabalin  (LYRICA ) 150 MG capsule Take 1 capsule (150 mg total) by mouth 2 (two) times daily. 60 capsule 6   traZODone  (DESYREL ) 100 MG tablet Take 1 tablet (100 mg total) by mouth at bedtime as needed for sleep. 90 tablet 2   pantoprazole  (PROTONIX ) 40 MG tablet Take 1 tablet (40 mg total) by mouth daily. (Patient not taking: Reported on 07/06/2024) 60 tablet 2   No current facility-administered medications on file prior to visit.    Allergies  Allergen Reactions   Hydrocodone Hives   Ibuprofen Other (See Comments)    Pt can't take this medication because it interacts with other medications that he is taking.     Social History   Socioeconomic History   Marital status: Married    Spouse name: Not on file   Number of children: Not on file   Years of education: Not on file   Highest education level: Not on file  Occupational History   Not on file  Tobacco Use   Smoking status: Every Day    Current packs/day: 1.00    Types: Cigarettes   Smokeless tobacco: Never  Vaping Use   Vaping status: Never Used  Substance and Sexual Activity   Alcohol use: Not Currently    Alcohol/week: 0.0 standard drinks of alcohol   Drug use: Yes    Types: Marijuana    Comment: OCC   Sexual activity: Not Currently  Other Topics Concern   Not on file  Social History Narrative   Left handed   Social Drivers of Health   Financial Resource Strain: Medium Risk (11/08/2021)   Overall Financial Resource Strain (CARDIA)    Difficulty of Paying Living Expenses: Somewhat hard  Food Insecurity: No Food Insecurity (11/04/2023)   Hunger Vital Sign    Worried About Running Out of Food in the Last Year: Never true    Ran Out of Food in the Last Year: Never true  Transportation Needs: No Transportation Needs (11/04/2023)   PRAPARE - Administrator, Civil Service (Medical): No    Lack of Transportation (Non-Medical): No  Physical Activity: Inactive (11/04/2023)    Exercise Vital Sign    Days of Exercise per Week: 0 days    Minutes of Exercise per Session: 0 min  Stress: No Stress Concern Present (11/04/2023)   [employer] of Occupational Health - Occupational Stress Questionnaire    Feeling of Stress : Not at all  Social Connections: Moderately Isolated (11/04/2023)   Social Connection and Isolation Panel    Frequency of Communication with Friends and Family: More than three times a week    Frequency of Social Gatherings with Friends and Family: Twice a week    Attends Religious Services: Never    Database administrator or Organizations: No    Attends Banker Meetings: Never    Marital Status: Married  Catering manager Violence: Not At  Risk (11/04/2023)   Humiliation, Afraid, Rape, and Kick questionnaire    Fear of Current or Ex-Partner: No    Emotionally Abused: No    Physically Abused: No    Sexually Abused: No    Family History  Problem Relation Age of Onset   Alcoholism Other    Arthritis Other    Breast cancer Other    Heart disease Mother     Past Surgical History:  Procedure Laterality Date   CHOLECYSTECTOMY      ROS: Review of Systems Negative except as stated above  PHYSICAL EXAM: BP 119/84 (BP Location: Left Arm, Patient Position: Sitting, Cuff Size: Normal)   Pulse 63   Temp 97.6 F (36.4 C) (Oral)   Ht 6' 2 (1.88 m)   SpO2 92%   BMI 29.15 kg/m   Physical Exam General appearance - pt in WC in NAD Mental status - patient answers questions appropriately and follows commands  Neck - supple, no significant adenopathy Chest - clear to auscultation, no wheezes, rales or rhonchi, symmetric air entry Heart - normal rate, regular rhythm, normal S1, S2, no murmurs, rubs, clicks or gallops Neurological - dense hemiplegia on the right side. Right-sided facial droop. LT hand: mild muscle wasting of intrinsic muscles of the hand.  Grip LT 3-4/5. Power LUE 4/5, LLE 4-/5 Extremities - no edema in the left  lower leg. Mild edema in the right lower leg. This is chronic     Latest Ref Rng & Units 11/04/2023    4:38 PM 02/26/2023    2:16 PM 11/13/2022    3:43 PM  CMP  Glucose 70 - 99 mg/dL 94  70  72   BUN 6 - 24 mg/dL 11  13  12    Creatinine 0.76 - 1.27 mg/dL 8.91  9.13  9.17   Sodium 134 - 144 mmol/L 140  142  140   Potassium 3.5 - 5.2 mmol/L 4.6  4.2  4.2   Chloride 96 - 106 mmol/L 103  105  101   CO2 20 - 29 mmol/L 22  22  25    Calcium  8.7 - 10.2 mg/dL 9.6  9.2  9.7   Total Protein 6.0 - 8.5 g/dL 7.2  7.0    Total Bilirubin 0.0 - 1.2 mg/dL 0.3  0.3    Alkaline Phos 44 - 121 IU/L 120  96    AST 0 - 40 IU/L 11  13    ALT 0 - 44 IU/L 10  15     Lipid Panel     Component Value Date/Time   CHOL 84 (L) 02/26/2023 1416   TRIG 78 02/26/2023 1416   HDL 28 (L) 02/26/2023 1416   CHOLHDL 3.0 02/26/2023 1416   CHOLHDL 3.1 09/12/2020 0513   VLDL 21 09/12/2020 0513   LDLCALC 40 02/26/2023 1416    CBC    Component Value Date/Time   WBC 11.5 (H) 11/04/2023 1638   WBC 9.3 03/15/2022 1511   RBC 4.75 11/04/2023 1638   RBC 4.35 03/15/2022 1511   HGB 12.9 (L) 11/04/2023 1638   HCT 40.4 11/04/2023 1638   PLT 372 11/04/2023 1638   MCV 85 11/04/2023 1638   MCV 84 03/10/2012 1336   MCH 27.2 11/04/2023 1638   MCH 29.2 03/15/2022 1511   MCHC 31.9 11/04/2023 1638   MCHC 33.3 03/15/2022 1511   RDW 15.2 11/04/2023 1638   RDW 14.4 03/10/2012 1336   LYMPHSABS 2.2 02/26/2023 1416   LYMPHSABS 2.3 03/10/2012 1336  MONOABS 0.5 03/15/2022 1511   MONOABS 0.8 03/10/2012 1336   EOSABS 0.2 02/26/2023 1416   EOSABS 0.2 03/10/2012 1336   BASOSABS 0.1 02/26/2023 1416   BASOSABS 0.1 03/10/2012 1336    ASSESSMENT AND PLAN: 1. Essential hypertension (Primary) At goal.  Continue amlodipine  10 mg daily, hydralazine  50 mg twice a day, labetalol  300 mg twice a day - labetalol  (NORMODYNE ) 300 MG tablet; Take 1 tablet (300 mg total) by mouth 2 (two) times daily.  Dispense: 180 tablet; Refill: 3 - CBC -  Comprehensive metabolic panel with GFR - Lipid panel  2. Tobacco abuse Strongly advised to quit smoking.  3. 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  Prescription will be sent to [facility].  Patient is wheelchair dependent.  His wife pushes him in the wheelchair. - DME Wheelchair manual  4. Screening for colon cancer I will resend request to [facility] for Cologuard test.  It was ordered on last visit but wife states they never received it.  I have confirmed the address. - Cologuard  5. Insomnia, unspecified type Doing well on trazodone . - traZODone  (DESYREL ) 100 MG tablet; Take 1 tablet (100 mg total) by mouth at bedtime as needed for sleep.  Dispense: 90 tablet; Refill: 2  6. Prostate cancer screening Patient agreeable to screening with PSA level. - PSA   Patient was given the opportunity to ask questions.  Patient verbalized understanding of the plan and was able to repeat key elements of the plan.   This documentation was completed using Paediatric nurse.  Any transcriptional errors are unintentional.  No orders of the defined types were placed in this encounter.    Requested Prescriptions   Pending Prescriptions Disp Refills   labetalol  (NORMODYNE ) 300 MG tablet 180 tablet 1    Sig: Take 1 tablet (300 mg total) by mouth 2 (two) times daily.    No follow-ups on file.  Barnie Louder, MD, FACP

## 2024-07-07 ENCOUNTER — Ambulatory Visit: Payer: Self-pay | Admitting: Internal Medicine

## 2024-07-07 LAB — LIPID PANEL
Chol/HDL Ratio: 3 ratio (ref 0.0–5.0)
Cholesterol, Total: 91 mg/dL — ABNORMAL LOW (ref 100–199)
HDL: 30 mg/dL — ABNORMAL LOW (ref >39)
LDL Chol Calc (NIH): 46 mg/dL (ref 0–99)
Triglycerides: 65 mg/dL (ref 0–149)
VLDL Cholesterol Cal: 15 mg/dL (ref 5–40)

## 2024-07-07 LAB — COMPREHENSIVE METABOLIC PANEL WITH GFR
ALT: 10 IU/L (ref 0–44)
AST: 10 IU/L (ref 0–40)
Albumin: 4.5 g/dL (ref 3.8–4.9)
Alkaline Phosphatase: 110 IU/L (ref 44–121)
BUN/Creatinine Ratio: 15 (ref 9–20)
BUN: 16 mg/dL (ref 6–24)
Bilirubin Total: 0.5 mg/dL (ref 0.0–1.2)
CO2: 23 mmol/L (ref 20–29)
Calcium: 9.8 mg/dL (ref 8.7–10.2)
Chloride: 109 mmol/L — ABNORMAL HIGH (ref 96–106)
Creatinine, Ser: 1.08 mg/dL (ref 0.76–1.27)
Globulin, Total: 2.6 g/dL (ref 1.5–4.5)
Glucose: 85 mg/dL (ref 70–99)
Potassium: 5.1 mmol/L (ref 3.5–5.2)
Sodium: 145 mmol/L — ABNORMAL HIGH (ref 134–144)
Total Protein: 7.1 g/dL (ref 6.0–8.5)
eGFR: 82 mL/min/1.73 (ref >59)

## 2024-07-07 LAB — CBC
Hematocrit: 39.4 % (ref 37.5–51.0)
Hemoglobin: 12.6 g/dL — ABNORMAL LOW (ref 13.0–17.7)
MCH: 27.8 pg (ref 26.6–33.0)
MCHC: 32 g/dL (ref 31.5–35.7)
MCV: 87 fL (ref 79–97)
Platelets: 300 x10E3/uL (ref 150–450)
RBC: 4.53 x10E6/uL (ref 4.14–5.80)
RDW: 15.5 % — ABNORMAL HIGH (ref 11.6–15.4)
WBC: 8.4 x10E3/uL (ref 3.4–10.8)

## 2024-07-07 LAB — PSA: Prostate Specific Ag, Serum: 0.7 ng/mL (ref 0.0–4.0)

## 2024-07-08 DIAGNOSIS — I6930 Unspecified sequelae of cerebral infarction: Secondary | ICD-10-CM | POA: Diagnosis not present

## 2024-07-09 ENCOUNTER — Telehealth: Payer: Self-pay

## 2024-07-09 ENCOUNTER — Ambulatory Visit: Attending: Internal Medicine | Admitting: Internal Medicine

## 2024-07-09 ENCOUNTER — Other Ambulatory Visit: Payer: Self-pay

## 2024-07-09 ENCOUNTER — Other Ambulatory Visit (HOSPITAL_COMMUNITY): Payer: Self-pay

## 2024-07-09 DIAGNOSIS — R159 Full incontinence of feces: Secondary | ICD-10-CM | POA: Diagnosis not present

## 2024-07-09 DIAGNOSIS — R3981 Functional urinary incontinence: Secondary | ICD-10-CM

## 2024-07-09 DIAGNOSIS — I69353 Hemiplegia and hemiparesis following cerebral infarction affecting right non-dominant side: Secondary | ICD-10-CM

## 2024-07-09 DIAGNOSIS — G4700 Insomnia, unspecified: Secondary | ICD-10-CM

## 2024-07-09 NOTE — Telephone Encounter (Signed)
 [employer] [facility] states that patient had a wheelchair in 10/19/2020. Below is message I received:   @Jason Romero Patient received wheelchair on 12/8/2021and is not quite eligible for another one at this time  He can get a new wheelchair 10/19/2025

## 2024-07-09 NOTE — Telephone Encounter (Signed)
 Please see Jason Romero's message.  Call patient and his wife and let them know that according to [facility] it is showing in the system that he received his last wheelchair 10/2020.  Insurance will not pay for another 1 for 5 years meaning that he would not be eligible for a new chair again until 10/2025

## 2024-07-09 NOTE — Progress Notes (Signed)
 Virtual Visit via Video Note  I connected with Jason Romero on 07/09/2024 at 12:15 PM by a video enabled telemedicine application and verified that I am speaking with the correct person using two identifiers.  Location: Patient: home Provider: Office   I discussed the limitations of evaluation and management by telemedicine and the availability of in person appointments. The patient expressed understanding and agreed to proceed.  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  Patient wife is with him and gives most of the history.  Purpose of this visit was to document need for incontinence supplies.  Discussed the use of AI scribe software for clinical note transcription with the patient, who gave verbal consent to proceed.  History of Present Illness Jason Romero is a 53 year old male who presents for incontinence supplies due to urinary and bowel incontinence. Wife is with him and provides most of the hx.  He requires incontinence supplies, specifically Chux pads, due to both urinary and bowel incontinence. He does not need Depends or additional gloves at this time, as he has an adequate supply of gloves.  Patient has history of spastic hemiplegia affecting nondominant right side as a result of CVA that he had 08/2020.  He is wheelchair-bound.  He has a bedside commode but sometimes wife is not able to get him to the commode in time for him to urinate or have a bowel movement.  He has been receiving supplies from [employer].  He is wanting the Chux pads only.  He does not want the depends undergarment.    Wife also inquired about where things stood in terms of him getting a new wheelchair.  This was discussed on his most recent visit.  I informed her that we have to send in 1 more form to [facility] for this to be  processed.  That form should go out today.       Observations/Objective: Patient sitting in chair and appears in NAD.  Assessment and Plan: 1. Functional urinary incontinence (Primary) 2. Functional fecal incontinence 3. 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 Form will be completed and sent to [employer] for chux pads   Follow Up Instructions: As previously scheduled.   I discussed the assessment and treatment plan with the patient. The patient was provided an opportunity to ask questions and all were answered. The patient agreed with the plan and demonstrated an understanding of the instructions.   The patient was advised to call back or seek an in-person evaluation if the symptoms worsen or if the condition fails to improve as anticipated.  I spent 6 minutes dedicated to the care of this patient on the date of this encounter to include previsit review of of chart, face-to-face time with patient discussing diagnosis and management and post visit completion of form.  This note has been created with Education officer, environmental. Any transcriptional errors are unintentional.  Barnie Louder, MD

## 2024-07-10 ENCOUNTER — Other Ambulatory Visit: Payer: Self-pay

## 2024-07-10 DIAGNOSIS — I6930 Unspecified sequelae of cerebral infarction: Secondary | ICD-10-CM | POA: Diagnosis not present

## 2024-07-10 NOTE — Telephone Encounter (Signed)
 Called & spoke to [facility] (authorized to receive information per DPR on file). Informed of Alycia's message below. [name] expressed verbal understanding and will call them for further clarification because she believes he received his wheelchair 2020 and not 2021. No further assistance needed at this time.

## 2024-07-13 DIAGNOSIS — G8191 Hemiplegia, unspecified affecting right dominant side: Secondary | ICD-10-CM | POA: Diagnosis not present

## 2024-07-13 DIAGNOSIS — R32 Unspecified urinary incontinence: Secondary | ICD-10-CM | POA: Diagnosis not present

## 2024-07-13 DIAGNOSIS — R8290 Unspecified abnormal findings in urine: Secondary | ICD-10-CM | POA: Diagnosis not present

## 2024-07-15 DIAGNOSIS — I6930 Unspecified sequelae of cerebral infarction: Secondary | ICD-10-CM | POA: Diagnosis not present

## 2024-07-21 ENCOUNTER — Other Ambulatory Visit: Payer: Self-pay

## 2024-07-21 ENCOUNTER — Other Ambulatory Visit (HOSPITAL_COMMUNITY): Payer: Self-pay

## 2024-07-21 DIAGNOSIS — I6930 Unspecified sequelae of cerebral infarction: Secondary | ICD-10-CM | POA: Diagnosis not present

## 2024-07-22 DIAGNOSIS — I6930 Unspecified sequelae of cerebral infarction: Secondary | ICD-10-CM | POA: Diagnosis not present

## 2024-07-24 DIAGNOSIS — I6930 Unspecified sequelae of cerebral infarction: Secondary | ICD-10-CM | POA: Diagnosis not present

## 2024-07-28 DIAGNOSIS — I6930 Unspecified sequelae of cerebral infarction: Secondary | ICD-10-CM | POA: Diagnosis not present

## 2024-07-29 DIAGNOSIS — I6930 Unspecified sequelae of cerebral infarction: Secondary | ICD-10-CM | POA: Diagnosis not present

## 2024-07-30 DIAGNOSIS — I6930 Unspecified sequelae of cerebral infarction: Secondary | ICD-10-CM | POA: Diagnosis not present

## 2024-07-31 DIAGNOSIS — I6930 Unspecified sequelae of cerebral infarction: Secondary | ICD-10-CM | POA: Diagnosis not present

## 2024-08-03 ENCOUNTER — Other Ambulatory Visit (HOSPITAL_COMMUNITY): Payer: Self-pay

## 2024-08-03 DIAGNOSIS — I6930 Unspecified sequelae of cerebral infarction: Secondary | ICD-10-CM | POA: Diagnosis not present

## 2024-08-04 DIAGNOSIS — I6930 Unspecified sequelae of cerebral infarction: Secondary | ICD-10-CM | POA: Diagnosis not present

## 2024-08-05 ENCOUNTER — Other Ambulatory Visit (HOSPITAL_COMMUNITY): Payer: Self-pay

## 2024-08-06 ENCOUNTER — Other Ambulatory Visit (HOSPITAL_COMMUNITY): Payer: Self-pay

## 2024-08-06 DIAGNOSIS — I6930 Unspecified sequelae of cerebral infarction: Secondary | ICD-10-CM | POA: Diagnosis not present

## 2024-08-07 ENCOUNTER — Other Ambulatory Visit: Payer: Self-pay

## 2024-08-07 DIAGNOSIS — I6930 Unspecified sequelae of cerebral infarction: Secondary | ICD-10-CM | POA: Diagnosis not present

## 2024-08-07 MED ORDER — TRAZODONE HCL 100 MG PO TABS
150.0000 mg | ORAL_TABLET | Freq: Every evening | ORAL | 2 refills | Status: AC | PRN
  Filled 2024-08-07 – 2024-09-02 (×2): qty 135, 90d supply, fill #0
  Filled 2024-09-02: qty 51, 34d supply, fill #0
  Filled 2024-10-14: qty 51, 34d supply, fill #1
  Filled 2024-11-19: qty 51, 34d supply, fill #2

## 2024-08-07 NOTE — Addendum Note (Signed)
 Addended by: VICCI SOBER B on: 08/07/2024 05:16 PM   Modules accepted: Orders

## 2024-08-07 NOTE — Telephone Encounter (Signed)
 Lets increase the Trazodone  to 1 1/2 tablet daily.  This means increase from 100 mg daily to 150 mg daily. Updated rxn sent to his pharmacy.

## 2024-08-07 NOTE — Telephone Encounter (Signed)
 Copied from CRM [id number]. Topic: Clinical - Medication Question >> Aug 06, 2024 10:27 AM Dedra B wrote: Reason for CRM: Pt wife, [name], called to see if she can give pt two of his Trazodone  100 mg at night instead one. She said one is not helping as much. Pls call [name] at [phone].

## 2024-08-10 DIAGNOSIS — I6930 Unspecified sequelae of cerebral infarction: Secondary | ICD-10-CM | POA: Diagnosis not present

## 2024-08-10 NOTE — Telephone Encounter (Signed)
 Called patient but no answer. Unable to LVM

## 2024-08-11 DIAGNOSIS — I6930 Unspecified sequelae of cerebral infarction: Secondary | ICD-10-CM | POA: Diagnosis not present

## 2024-08-11 NOTE — Telephone Encounter (Signed)
 Called patient but no answer. Unable to LVM. Called the patient's wife but unable to LVM.

## 2024-08-12 DIAGNOSIS — R32 Unspecified urinary incontinence: Secondary | ICD-10-CM | POA: Diagnosis not present

## 2024-08-12 DIAGNOSIS — R8290 Unspecified abnormal findings in urine: Secondary | ICD-10-CM | POA: Diagnosis not present

## 2024-08-12 DIAGNOSIS — G8191 Hemiplegia, unspecified affecting right dominant side: Secondary | ICD-10-CM | POA: Diagnosis not present

## 2024-08-13 ENCOUNTER — Other Ambulatory Visit (HOSPITAL_COMMUNITY): Payer: Self-pay

## 2024-08-13 DIAGNOSIS — I6930 Unspecified sequelae of cerebral infarction: Secondary | ICD-10-CM | POA: Diagnosis not present

## 2024-08-17 DIAGNOSIS — I6930 Unspecified sequelae of cerebral infarction: Secondary | ICD-10-CM | POA: Diagnosis not present

## 2024-08-18 DIAGNOSIS — I6930 Unspecified sequelae of cerebral infarction: Secondary | ICD-10-CM | POA: Diagnosis not present

## 2024-08-20 DIAGNOSIS — I6930 Unspecified sequelae of cerebral infarction: Secondary | ICD-10-CM | POA: Diagnosis not present

## 2024-08-21 DIAGNOSIS — I6930 Unspecified sequelae of cerebral infarction: Secondary | ICD-10-CM | POA: Diagnosis not present

## 2024-08-24 ENCOUNTER — Other Ambulatory Visit (HOSPITAL_COMMUNITY): Payer: Self-pay

## 2024-08-26 DIAGNOSIS — I6930 Unspecified sequelae of cerebral infarction: Secondary | ICD-10-CM | POA: Diagnosis not present

## 2024-08-28 DIAGNOSIS — I6930 Unspecified sequelae of cerebral infarction: Secondary | ICD-10-CM | POA: Diagnosis not present

## 2024-08-31 DIAGNOSIS — I6930 Unspecified sequelae of cerebral infarction: Secondary | ICD-10-CM | POA: Diagnosis not present

## 2024-09-01 DIAGNOSIS — I6930 Unspecified sequelae of cerebral infarction: Secondary | ICD-10-CM | POA: Diagnosis not present

## 2024-09-02 ENCOUNTER — Other Ambulatory Visit: Payer: Self-pay

## 2024-09-02 ENCOUNTER — Other Ambulatory Visit (HOSPITAL_COMMUNITY): Payer: Self-pay

## 2024-09-02 DIAGNOSIS — I6930 Unspecified sequelae of cerebral infarction: Secondary | ICD-10-CM | POA: Diagnosis not present

## 2024-09-03 DIAGNOSIS — I6930 Unspecified sequelae of cerebral infarction: Secondary | ICD-10-CM | POA: Diagnosis not present

## 2024-09-04 DIAGNOSIS — I6930 Unspecified sequelae of cerebral infarction: Secondary | ICD-10-CM | POA: Diagnosis not present

## 2024-09-07 DIAGNOSIS — I6930 Unspecified sequelae of cerebral infarction: Secondary | ICD-10-CM | POA: Diagnosis not present

## 2024-09-09 DIAGNOSIS — I6930 Unspecified sequelae of cerebral infarction: Secondary | ICD-10-CM | POA: Diagnosis not present

## 2024-09-10 DIAGNOSIS — I6930 Unspecified sequelae of cerebral infarction: Secondary | ICD-10-CM | POA: Diagnosis not present

## 2024-09-11 DIAGNOSIS — I6930 Unspecified sequelae of cerebral infarction: Secondary | ICD-10-CM | POA: Diagnosis not present

## 2024-09-12 DIAGNOSIS — R32 Unspecified urinary incontinence: Secondary | ICD-10-CM | POA: Diagnosis not present

## 2024-09-12 DIAGNOSIS — G8191 Hemiplegia, unspecified affecting right dominant side: Secondary | ICD-10-CM | POA: Diagnosis not present

## 2024-09-12 DIAGNOSIS — R8290 Unspecified abnormal findings in urine: Secondary | ICD-10-CM | POA: Diagnosis not present

## 2024-09-14 ENCOUNTER — Other Ambulatory Visit: Payer: Self-pay

## 2024-09-14 ENCOUNTER — Other Ambulatory Visit: Payer: Self-pay | Admitting: Internal Medicine

## 2024-09-14 ENCOUNTER — Other Ambulatory Visit (HOSPITAL_COMMUNITY): Payer: Self-pay

## 2024-09-14 DIAGNOSIS — F411 Generalized anxiety disorder: Secondary | ICD-10-CM

## 2024-09-14 DIAGNOSIS — I6930 Unspecified sequelae of cerebral infarction: Secondary | ICD-10-CM | POA: Diagnosis not present

## 2024-09-14 MED ORDER — HYDROXYZINE PAMOATE 50 MG PO CAPS
50.0000 mg | ORAL_CAPSULE | Freq: Three times a day (TID) | ORAL | 0 refills | Status: AC | PRN
  Filled 2024-09-14: qty 90, 30d supply, fill #0
  Filled 2024-12-10: qty 90, 30d supply, fill #1

## 2024-09-15 ENCOUNTER — Ambulatory Visit: Payer: Self-pay

## 2024-09-15 DIAGNOSIS — I6930 Unspecified sequelae of cerebral infarction: Secondary | ICD-10-CM | POA: Diagnosis not present

## 2024-09-15 NOTE — Telephone Encounter (Signed)
 Call to advised ED. VM left

## 2024-09-15 NOTE — Telephone Encounter (Signed)
 FYI Only or Action Required?: Action required by provider: clinical question for provider.  Patient was last seen in primary care on 07/09/2024 by Vicci Barnie NOVAK, MD.  Called Nurse Triage reporting Hematuria.  Symptoms began today.  Interventions attempted: Nothing.  Symptoms are: unchanged.  Triage Disposition: See Physician Within 24 Hours  Patient/caregiver understands and will follow disposition?: No     Copied from CRM #[id number]. Topic: Clinical - Red Word Triage >> Sep 15, 2024  3:10 PM Jason Romero wrote: Red Word that prompted transfer to Nurse Triage: Pt's wife Jason Romero, Jason Romero [phone], M: [phone] calling on behalf of pt stated that pt is urinating blood and that it started today. Reason for Disposition  Blood in urine  (Exception: Could be normal menstrual bleeding.)  Answer Assessment - Initial Assessment Questions Pt's wife states that he has had a stroke so she's not sure how much he can feel. She states he is not peeing a lot but feels like he has to go. She states he told her no pain with urination. She did notice 2 drops of blood in his urinal when he just peed. RN attempted to schedule pt an appt explaining that they will want to test his urine. She states she currently doesn't have vehicle and since he has had a stroke he is difficult to move. Care advise given in the meantime.     1. COLOR of URINE: Describe the color of the urine.  (e.g., tea-colored, pink, red, bloody) Do you have blood clots in your urine? (e.g., none, pea, grape, small coin)     Clear but then at the end dark.  2. ONSET: When did the bleeding start?      Just a little bit ago 3. EPISODES: How many times has there been blood in the urine? or How many times today?     Just 2 drops 4. PAIN with URINATION: Is there any pain with passing your urine? If Yes, ask: How bad is the pain?  (Scale 1-10; or mild, moderate, severe)     no 5. FEVER: Do you have a fever? If Yes,  ask: What is your temperature, how was it measured, and when did it start?     no 6. ASSOCIATED SYMPTOMS: Are you passing urine more frequently than usual?     no 7. OTHER SYMPTOMS: Do you have any other symptoms? (e.g., back/flank pain, abdomen pain, vomiting)     No  Protocols used: Urine - Blood In-A-AH

## 2024-09-18 ENCOUNTER — Inpatient Hospital Stay (HOSPITAL_COMMUNITY)
Admission: EM | Admit: 2024-09-18 | Discharge: 2024-09-22 | DRG: 854 | Disposition: A | Discharge status: Home Health | Attending: Internal Medicine | Admitting: Internal Medicine

## 2024-09-18 ENCOUNTER — Emergency Department (HOSPITAL_COMMUNITY)

## 2024-09-18 ENCOUNTER — Other Ambulatory Visit: Payer: Self-pay

## 2024-09-18 DIAGNOSIS — K4020 Bilateral inguinal hernia, without obstruction or gangrene, not specified as recurrent: Secondary | ICD-10-CM | POA: Diagnosis not present

## 2024-09-18 DIAGNOSIS — E876 Hypokalemia: Secondary | ICD-10-CM | POA: Diagnosis not present

## 2024-09-18 DIAGNOSIS — I482 Chronic atrial fibrillation, unspecified: Secondary | ICD-10-CM | POA: Diagnosis present

## 2024-09-18 DIAGNOSIS — I252 Old myocardial infarction: Secondary | ICD-10-CM

## 2024-09-18 DIAGNOSIS — R319 Hematuria, unspecified: Secondary | ICD-10-CM | POA: Diagnosis not present

## 2024-09-18 DIAGNOSIS — R109 Unspecified abdominal pain: Secondary | ICD-10-CM | POA: Diagnosis not present

## 2024-09-18 DIAGNOSIS — Z9049 Acquired absence of other specified parts of digestive tract: Secondary | ICD-10-CM

## 2024-09-18 DIAGNOSIS — N3001 Acute cystitis with hematuria: Principal | ICD-10-CM | POA: Diagnosis present

## 2024-09-18 DIAGNOSIS — N200 Calculus of kidney: Secondary | ICD-10-CM

## 2024-09-18 DIAGNOSIS — J9811 Atelectasis: Secondary | ICD-10-CM | POA: Diagnosis present

## 2024-09-18 DIAGNOSIS — E785 Hyperlipidemia, unspecified: Secondary | ICD-10-CM | POA: Diagnosis present

## 2024-09-18 DIAGNOSIS — R599 Enlarged lymph nodes, unspecified: Secondary | ICD-10-CM | POA: Diagnosis present

## 2024-09-18 DIAGNOSIS — D649 Anemia, unspecified: Secondary | ICD-10-CM | POA: Diagnosis present

## 2024-09-18 DIAGNOSIS — R053 Chronic cough: Secondary | ICD-10-CM | POA: Diagnosis present

## 2024-09-18 DIAGNOSIS — A419 Sepsis, unspecified organism: Principal | ICD-10-CM | POA: Diagnosis present

## 2024-09-18 DIAGNOSIS — J189 Pneumonia, unspecified organism: Secondary | ICD-10-CM | POA: Diagnosis not present

## 2024-09-18 DIAGNOSIS — G4733 Obstructive sleep apnea (adult) (pediatric): Secondary | ICD-10-CM | POA: Diagnosis present

## 2024-09-18 DIAGNOSIS — I69251 Hemiplegia and hemiparesis following other nontraumatic intracranial hemorrhage affecting right dominant side: Secondary | ICD-10-CM

## 2024-09-18 DIAGNOSIS — B9620 Unspecified Escherichia coli [E. coli] as the cause of diseases classified elsewhere: Secondary | ICD-10-CM | POA: Diagnosis present

## 2024-09-18 DIAGNOSIS — Z803 Family history of malignant neoplasm of breast: Secondary | ICD-10-CM

## 2024-09-18 DIAGNOSIS — R5381 Other malaise: Secondary | ICD-10-CM | POA: Diagnosis present

## 2024-09-18 DIAGNOSIS — N202 Calculus of kidney with calculus of ureter: Secondary | ICD-10-CM | POA: Diagnosis present

## 2024-09-18 DIAGNOSIS — J440 Chronic obstructive pulmonary disease with acute lower respiratory infection: Secondary | ICD-10-CM | POA: Diagnosis present

## 2024-09-18 DIAGNOSIS — Z886 Allergy status to analgesic agent status: Secondary | ICD-10-CM

## 2024-09-18 DIAGNOSIS — I10 Essential (primary) hypertension: Secondary | ICD-10-CM | POA: Diagnosis present

## 2024-09-18 DIAGNOSIS — R509 Fever, unspecified: Secondary | ICD-10-CM | POA: Diagnosis not present

## 2024-09-18 DIAGNOSIS — Z885 Allergy status to narcotic agent status: Secondary | ICD-10-CM

## 2024-09-18 DIAGNOSIS — Z1152 Encounter for screening for COVID-19: Secondary | ICD-10-CM

## 2024-09-18 DIAGNOSIS — Z79899 Other long term (current) drug therapy: Secondary | ICD-10-CM

## 2024-09-18 DIAGNOSIS — B182 Chronic viral hepatitis C: Secondary | ICD-10-CM | POA: Diagnosis present

## 2024-09-18 DIAGNOSIS — Z7982 Long term (current) use of aspirin: Secondary | ICD-10-CM

## 2024-09-18 DIAGNOSIS — N390 Urinary tract infection, site not specified: Secondary | ICD-10-CM | POA: Insufficient documentation

## 2024-09-18 DIAGNOSIS — R918 Other nonspecific abnormal finding of lung field: Secondary | ICD-10-CM | POA: Diagnosis not present

## 2024-09-18 DIAGNOSIS — Z23 Encounter for immunization: Secondary | ICD-10-CM

## 2024-09-18 DIAGNOSIS — R059 Cough, unspecified: Secondary | ICD-10-CM | POA: Diagnosis not present

## 2024-09-18 DIAGNOSIS — F17210 Nicotine dependence, cigarettes, uncomplicated: Secondary | ICD-10-CM | POA: Diagnosis present

## 2024-09-18 DIAGNOSIS — K219 Gastro-esophageal reflux disease without esophagitis: Secondary | ICD-10-CM | POA: Diagnosis present

## 2024-09-18 DIAGNOSIS — Z8249 Family history of ischemic heart disease and other diseases of the circulatory system: Secondary | ICD-10-CM

## 2024-09-18 DIAGNOSIS — R0989 Other specified symptoms and signs involving the circulatory and respiratory systems: Secondary | ICD-10-CM | POA: Diagnosis not present

## 2024-09-18 LAB — CBC
HCT: 35.5 % — ABNORMAL LOW (ref 39.0–52.0)
Hemoglobin: 11.7 g/dL — ABNORMAL LOW (ref 13.0–17.0)
MCH: 27.1 pg (ref 26.0–34.0)
MCHC: 33 g/dL (ref 30.0–36.0)
MCV: 82.4 fL (ref 80.0–100.0)
Platelets: 266 K/uL (ref 150–400)
RBC: 4.31 MIL/uL (ref 4.22–5.81)
RDW: 15.4 % (ref 11.5–15.5)
WBC: 16.2 K/uL — ABNORMAL HIGH (ref 4.0–10.5)
nRBC: 0 % (ref 0.0–0.2)

## 2024-09-18 LAB — RESP PANEL BY RT-PCR (RSV, FLU A&B, COVID)  RVPGX2
Influenza A by PCR: NEGATIVE
Influenza B by PCR: NEGATIVE
Resp Syncytial Virus by PCR: NEGATIVE
SARS Coronavirus 2 by RT PCR: NEGATIVE

## 2024-09-18 LAB — COMPREHENSIVE METABOLIC PANEL WITH GFR
ALT: 11 U/L (ref 0–44)
AST: 12 U/L — ABNORMAL LOW (ref 15–41)
Albumin: 3.1 g/dL — ABNORMAL LOW (ref 3.5–5.0)
Alkaline Phosphatase: 64 U/L (ref 38–126)
Anion gap: 13 (ref 5–15)
BUN: 10 mg/dL (ref 6–20)
CO2: 20 mmol/L — ABNORMAL LOW (ref 22–32)
Calcium: 8.4 mg/dL — ABNORMAL LOW (ref 8.9–10.3)
Chloride: 104 mmol/L (ref 98–111)
Creatinine, Ser: 1.05 mg/dL (ref 0.61–1.24)
GFR, Estimated: >60 mL/min (ref >60)
Glucose, Bld: 111 mg/dL — ABNORMAL HIGH (ref 70–99)
Potassium: 3 mmol/L — ABNORMAL LOW (ref 3.5–5.1)
Sodium: 137 mmol/L (ref 135–145)
Total Bilirubin: 0.9 mg/dL (ref 0.0–1.2)
Total Protein: 6.6 g/dL (ref 6.5–8.1)

## 2024-09-18 LAB — URINALYSIS, ROUTINE W REFLEX MICROSCOPIC
Bilirubin Urine: NEGATIVE
Glucose, UA: NEGATIVE mg/dL
Ketones, ur: NEGATIVE mg/dL
Nitrite: NEGATIVE
Protein, ur: 100 mg/dL — AB
RBC / HPF: >50 RBC/hpf (ref 0–5)
Specific Gravity, Urine: 1.009 (ref 1.005–1.030)
WBC, UA: >50 WBC/hpf (ref 0–5)
pH: 6 (ref 5.0–8.0)

## 2024-09-18 LAB — CBG MONITORING, ED: Glucose-Capillary: 108 mg/dL — ABNORMAL HIGH (ref 70–99)

## 2024-09-18 LAB — I-STAT CG4 LACTIC ACID, ED: Lactic Acid, Venous: 0.9 mmol/L (ref 0.5–1.9)

## 2024-09-18 MED ORDER — POTASSIUM CHLORIDE CRYS ER 20 MEQ PO TBCR
40.0000 meq | EXTENDED_RELEASE_TABLET | Freq: Once | ORAL | Status: AC
  Administered 2024-09-19: 40 meq via ORAL
  Filled 2024-09-18: qty 2

## 2024-09-18 MED ORDER — SODIUM CHLORIDE 0.9 % IV SOLN
1.0000 g | Freq: Once | INTRAVENOUS | Status: AC
  Administered 2024-09-18: 1 g via INTRAVENOUS
  Filled 2024-09-18: qty 10

## 2024-09-18 MED ORDER — LACTATED RINGERS IV BOLUS
1000.0000 mL | Freq: Once | INTRAVENOUS | Status: AC
  Administered 2024-09-18: 1000 mL via INTRAVENOUS

## 2024-09-18 MED ORDER — SODIUM CHLORIDE 0.9 % IV SOLN
500.0000 mg | Freq: Once | INTRAVENOUS | Status: AC
  Administered 2024-09-19: 500 mg via INTRAVENOUS
  Filled 2024-09-18: qty 5

## 2024-09-18 NOTE — ED Provider Notes (Signed)
 [place] EMERGENCY DEPARTMENT AT [facility] Provider Note   CSN: [id number] Arrival date & time: 09/18/24  1953     History  Chief Complaint  Patient presents with   Hematuria    Jason Romero is a 53 y.o. male with PMH as listed below who presents BIB [facility] from home c/o blood in urine x 1 day. Pt took tylenol  at home. Pt also c/o general malaise and non productive cough x1 week. Started having a fever today. Per EMS, pt had 102 fever at home. Wife at bedside relays that he has had a stroke in the past and so sometimes gets confused; is pretty much acting at his baseline but she cannot determine where exactly he is hurting, though he has complained of some pain in his side. No nausea/vomiting.    EMS vitals: 108/82 80 HR CBG 119   Past Medical History:  Diagnosis Date   A-fib (HCC)    Bilateral impacted cerumen 05/08/2022   Chronic headaches    COPD (chronic obstructive pulmonary disease) (HCC)    GERD (gastroesophageal reflux disease)    History of blood transfusion    Hypertension    MI (myocardial infarction) (HCC)    Murmur 02/12/2021   Seizures (HCC)    Stroke (HCC)        Home Medications Prior to Admission medications   Medication Sig Start Date End Date Taking? Authorizing Provider  acetaminophen  (TYLENOL ) 325 MG tablet Take 2 tablets (650 mg total) by mouth every 4 (four) hours as needed for mild pain (or temp > 37.5 C (99.5 F)). 10/18/20  Yes Angiulli, Toribio PARAS, PA-C  albuterol  (VENTOLIN  HFA) 108 (90 Base) MCG/ACT inhaler Inhale 2 puffs into the lungs every 6 (six) hours as needed for wheezing or shortness of breath. 07/01/24  Yes Vicci Barnie NOVAK, MD  amLODipine  (NORVASC ) 10 MG tablet Take 1 tablet (10 mg total) by mouth daily. 03/05/24  Yes Vicci Barnie NOVAK, MD  aspirin  EC 81 MG tablet Take 1 tablet (81 mg total) by mouth daily. Swallow whole. 05/08/22  Yes Brien Belvie BRAVO, MD  atorvastatin  (LIPITOR) 20 MG tablet Take 1 tablet (20 mg total)  by mouth daily. 03/05/24  Yes Vicci Barnie NOVAK, MD  baclofen  (LIORESAL ) 20 MG tablet Take 1 tablet (20 mg total) by mouth 3 (three) times daily. 05/13/24  Yes Vicci Barnie NOVAK, MD  hydrALAZINE  (APRESOLINE ) 50 MG tablet Take 1 tablet (50 mg total) by mouth in the morning and at bedtime. 03/05/24  Yes Vicci Barnie NOVAK, MD  hydrOXYzine  (VISTARIL ) 50 MG capsule Take 1 capsule (50 mg total) by mouth every 8 (eight) hours as needed for anxiety. 09/14/24  Yes Vicci Barnie NOVAK, MD  labetalol  (NORMODYNE ) 300 MG tablet Take 1 tablet (300 mg total) by mouth 2 (two) times daily. 07/06/24  Yes Vicci Barnie NOVAK, MD  pregabalin  (LYRICA ) 150 MG capsule Take 1 capsule (150 mg total) by mouth 2 (two) times daily. 06/10/24  Yes Vicci Barnie NOVAK, MD  traZODone  (DESYREL ) 100 MG tablet Take 1.5 tablets (150 mg total) by mouth at bedtime as needed for sleep. 08/07/24  Yes Vicci Barnie NOVAK, MD  Blood Pressure Monitoring (BLOOD PRESSURE KIT) DEVI Use to monitor blood pressure 06/01/21   Brien Belvie BRAVO, MD      Allergies    Hydrocodone and Ibuprofen    Review of Systems   Review of Systems A 10 point review of systems was performed and is negative unless otherwise reported  in HPI.  Physical Exam Updated Vital Signs BP 123/78 (BP Location: Left Arm)   Pulse (!) 105   Temp 97.6 F (36.4 C) (Oral)   Resp 15   Ht 6' 2 (1.88 m)   Wt 103 kg   SpO2 (!) 89%   BMI 29.15 kg/m  Physical Exam General: Unwell-appearing male, lying in bed.  HEENT: PERRLA, Sclera anicteric, MMM, trachea midline.  Cardiology: Irregularly irregular tachycardic rate, no murmurs/rubs/gallops. Resp: Normal respiratory rate and effort. Coarse lung sounds. Abd: Soft, questionable diffuse abd TTP?, non-distended. No rebound tenderness or guarding.  GU: Deferred. MSK: No peripheral edema or signs of trauma. Extremities without deformity or TTP. No cyanosis or clubbing. Skin: warm, dry.  Back: +R flank TTP Neuro: A&Ox4, CNs II-XII  grossly intact. MAEs. Sensation grossly intact.  Psych: Normal mood and affect.   ED Results / Procedures / Treatments   Labs (all labs ordered are listed, but only abnormal results are displayed) Labs Reviewed  COMPREHENSIVE METABOLIC PANEL WITH GFR - Abnormal; Notable for the following components:   Potassium 3.0 (*)    CO2 20 (*)    Glucose, Bld 111 (*)    Calcium  8.4 (*)    Albumin 3.1 (*)    AST 12 (*)    All other components within normal limits  CBC - Abnormal; Notable for the following components:   WBC 16.2 (*)    Hemoglobin 11.7 (*)    HCT 35.5 (*)    All other components within normal limits  URINALYSIS, ROUTINE W REFLEX MICROSCOPIC - Abnormal; Notable for the following components:   APPearance CLOUDY (*)    Hgb urine dipstick MODERATE (*)    Protein, ur 100 (*)    Leukocytes,Ua LARGE (*)    Bacteria, UA FEW (*)    All other components within normal limits  CBG MONITORING, ED - Abnormal; Notable for the following components:   Glucose-Capillary 108 (*)    All other components within normal limits  RESP PANEL BY RT-PCR (RSV, FLU A&B, COVID)  RVPGX2  CULTURE, BLOOD (ROUTINE X 2)  CULTURE, BLOOD (ROUTINE X 2)  HIV ANTIBODY (ROUTINE TESTING W REFLEX)  MAGNESIUM  I-STAT CG4 LACTIC ACID, ED    EKG EKG Interpretation Date/Time:  Friday September 18 2024 20:14:56 EST Ventricular Rate:  94 PR Interval:    QRS Duration:  96 QT Interval:  337 QTC Calculation: 422 R Axis:   30  Text Interpretation: Atrial fibrillation Borderline repolarization abnormality Confirmed by Franklyn Gills [phone]) on 09/18/2024 10:09:14 PM  Radiology DG C-Arm 1-60 Min-No Report Result Date: 09/19/2024 Fluoroscopy was utilized by the requesting physician.  No radiographic interpretation.   CT Renal Stone Study Result Date: 09/18/2024 EXAM: CT UROGRAM 09/18/2024 11:43:03 PM TECHNIQUE: CT of the abdomen and pelvis was performed without the administration of intravenous contrast as per CT  urogram protocol. Multiplanar reformatted images as well as MIP urogram images are provided for review. Automated exposure control, iterative reconstruction, and/or weight based adjustment of the mA/kV was utilized to reduce the radiation dose to as low as reasonably achievable. COMPARISON: None available. CLINICAL HISTORY: Abdominal/flank pain, stone suspected. FINDINGS: LOWER CHEST: There is patchy atelectasis and airspace disease in the right lower lobe. LIVER: The liver is unremarkable. GALLBLADDER AND BILE DUCTS: The gallbladder is surgically absent. No biliary ductal dilatation. SPLEEN: No acute abnormality. PANCREAS: No acute abnormality. ADRENAL GLANDS: No acute abnormality. KIDNEYS, URETERS AND BLADDER: There is a right renal pelvis calculus measuring 1.8 cm without  hydronephrosis. No perinephric or periureteral stranding. Mild diffuse bladder wall thickening versus normal under distention. GI AND BOWEL: Stomach demonstrates no acute abnormality. There is no bowel obstruction. Appendix appears normal. PERITONEUM AND RETROPERITONEUM: No ascites. No free air. VASCULATURE: Aorta is normal in caliber. There are atherosclerotic calcifications of the aorta. LYMPH NODES: Numerous nonenlarged retroperitoneal and iliac chain lymph nodes. REPRODUCTIVE ORGANS: No acute abnormality. BONES AND SOFT TISSUES: No acute osseous abnormality. Mild bilateral neck periarticular fat stranding which is nonspecific. Small fat-containing bilateral inguinal hernias. IMPRESSION: 1. Right renal pelvis calculus measuring 1.8 cm without hydronephrosis. 2. Mild diffuse bladder wall thickening versus normal underdistention. Correlate clinically for cystitis. 3. Patchy right lower lobe atelectasis/airspace disease. 4. Status post cholecystectomy. 5. Numerous nonenlarged retroperitoneal and iliac chain lymph nodes, likely reactive. Electronically signed by: Greig Pique MD 09/18/2024 11:49 PM EST RP Workstation: [id number]   DG Chest 2  View Result Date: 09/18/2024 EXAM: 2 VIEW(S) XRAY OF THE CHEST 09/18/2024 08:58:13 PM COMPARISON: 5 / 4 / 23 CLINICAL HISTORY: cough, fever, malaise FINDINGS: LUNGS AND PLEURA: Low lung volumes. Retrocardiac atelectasis or pneumonia. No pulmonary edema. No pleural effusion. No pneumothorax. HEART AND MEDIASTINUM: Mild cardiomegaly. No acute abnormality of the mediastinal silhouette. BONES AND SOFT TISSUES: No acute osseous abnormality. IMPRESSION: 1. Hypoventilation with retrocardiac atelectasis or pneumonia. Electronically signed by: Norman Gatlin MD 09/18/2024 09:15 PM EST RP Workstation: [id number]    Procedures .Critical Care  Performed by: Franklyn Sid SAILOR, MD Authorized by: Franklyn Sid SAILOR, MD   Critical care provider statement:    Critical care time (minutes):  35   Critical care was necessary to treat or prevent imminent or life-threatening deterioration of the following conditions:  Sepsis   Critical care was time spent personally by me on the following activities:  Development of treatment plan with patient or surrogate, discussions with consultants, evaluation of patient's response to treatment, examination of patient, ordering and review of laboratory studies, ordering and review of radiographic studies, ordering and performing treatments and interventions, pulse oximetry, re-evaluation of patient's condition, review of old charts and obtaining history from patient or surrogate   Care discussed with: admitting provider       Medications Ordered in ED Medications  lactated ringers bolus 1,000 mL (0 mLs Intravenous Stopped 09/19/24 0033)  cefTRIAXone  (ROCEPHIN ) 1 g in sodium chloride  0.9 % 100 mL IVPB (0 g Intravenous Stopped 09/18/24 2239)  azithromycin  (ZITHROMAX ) 500 mg in sodium chloride  0.9 % 250 mL IVPB (0 mg Intravenous Stopped 09/19/24 0230)  potassium chloride  SA (KLOR-CON  M) CR tablet 40 mEq (40 mEq Oral Given 09/19/24 0126)  fentaNYL (SUBLIMAZE) injection 50 mcg (50 mcg  Intravenous Given 09/19/24 0121)    ED Course/ Medical Decision Making/ A&P                          Medical Decision Making Amount and/or Complexity of Data Reviewed Labs: ordered. Decision-making details documented in ED Course. Radiology: ordered. Decision-making details documented in ED Course.  Risk Prescription drug management. Decision regarding hospitalization.    This patient presents to the ED for concern of hematuria, cough, fever, this involves an extensive number of treatment options, and is a complaint that carries with it a high risk of complications and morbidity.  I considered the following differential and admission for this acute, potentially life threatening condition.   MDM:    Patient with hematuria and cough and fever.  Presents afebrile but mildly tachycardic with  soft blood pressure in the 90s systolic and mildly tachypneic into the low 20s.  He does have a leukocytosis and concern for sepsis.  Ordered blood cultures, IV fluids, and IV ceftriaxone  due to concern for UTI.  Indeed UA does demonstrate UTI.  Culture collected.  His blood pressure improved into the low 100s with fluids.  Wife reports that he recently quit smoking and has a chronic cough but indeed his chest x-ray shows possible retrocardiac left lower lobe pneumonia and so added IV azithromycin  as well.  He has mild hypokalemia likely due to decreased p.o. intake.  He also had complained of pain in his right side and with hematuria and UTI, considered pyelonephritis, urolithiasis, renal abscess; obtained a CT renal stone study which showed a right renal pelvis calculus measuring 1.8 cm.  Discussed with Dr. Carolee with urology who recommended IV antibiotics, making n.p.o. in case he needs a stent tomorrow morning.  CT renal stone study also demonstrates cystitis as well as patchy right lower lobe atelectasis or airspace disease, with no other surgical emergency.  Patient is admitted to medicine.  Clinical  Course as of 09/19/24 1734  Fri Sep 18, 2024  2045 Urinalysis, Routine w reflex microscopic -Urine, Clean Catch(!) +UTI. Culture collected, will give IV ceftriaxone . [HN]  2046 BP: 91/76 Soft pressures. Ordering blood cultures/fluids and IV ceftrixone. [HN]  2152 WBC(!): 16.2 +leukocytosis [HN]  2152 Lactic Acid, Venous: 0.9 wnl [HN]  2152 BP(!): 90/57 [HN]  2153 DG Chest 2 View 1. Hypoventilation with retrocardiac atelectasis or pneumonia. [HN]  2153 Added IV azithromycin  [HN]  2217 Potassium(!): 3.0 Mild hypokalemia, will replete [HN]  2354 CT Renal Stone Study 1. Right renal pelvis calculus measuring 1.8 cm without hydronephrosis. 2. Mild diffuse bladder wall thickening versus normal underdistention. Correlate clinically for cystitis. 3. Patchy right lower lobe atelectasis/airspace disease. 4. Status post cholecystectomy. 5. Numerous nonenlarged retroperitoneal and iliac chain lymph nodes, likely reactive.   [HN]  2356 D/w Dr. Carolee w/ urology who recommends IV abx.  [HN]  2359 Admitted to Dr. Alfornia [HN]    Clinical Course User Index [HN] Franklyn Sid SAILOR, MD    Labs: I Ordered, and personally interpreted labs.  The pertinent results include: Those listed above  Imaging Studies ordered: I ordered imaging studies including chest x-ray, CT renal stone study I independently visualized and interpreted imaging. I agree with the radiologist interpretation  Additional history obtained from chart review, wife at bedside.    Cardiac Monitoring: The patient was maintained on a cardiac monitor.  I personally viewed and interpreted the cardiac monitored which showed an underlying rhythm of: Atrial fibrillation  Reevaluation: After the interventions noted above, I reevaluated the patient and found that they have :stayed the same  Social Determinants of Health:  lives with wife  Disposition: Admit to hospitalist  Co morbidities that complicate the patient evaluation  Past  Medical History:  Diagnosis Date   A-fib (HCC)    Bilateral impacted cerumen 05/08/2022   Chronic headaches    COPD (chronic obstructive pulmonary disease) (HCC)    GERD (gastroesophageal reflux disease)    History of blood transfusion    Hypertension    MI (myocardial infarction) (HCC)    Murmur 02/12/2021   Seizures (HCC)    Stroke (HCC)      Medicines Meds ordered this encounter  Medications   lactated ringers bolus 1,000 mL   cefTRIAXone  (ROCEPHIN ) 1 g in sodium chloride  0.9 % 100 mL IVPB  Antibiotic Indication::   UTI   azithromycin  (ZITHROMAX ) 500 mg in sodium chloride  0.9 % 250 mL IVPB   potassium chloride  SA (KLOR-CON  M) CR tablet 40 mEq   fentaNYL (SUBLIMAZE) injection 50 mcg   fentaNYL (SUBLIMAZE) injection 12.5 mcg   naloxone (NARCAN) injection 0.4 mg   azithromycin  (ZITHROMAX ) 500 mg in sodium chloride  0.9 % 250 mL IVPB   cefTRIAXone  (ROCEPHIN ) 1 g in sodium chloride  0.9 % 100 mL IVPB    Antibiotic Indication::   UTI   OR Linked Order Group    acetaminophen  (TYLENOL ) tablet 650 mg    acetaminophen  (TYLENOL ) suppository 650 mg   albuterol  (PROVENTIL ) (2.5 MG/3ML) 0.083% nebulizer solution 3 mL   0.9 %  sodium chloride  infusion   DISCONTD: lactated ringers infusion   OR Linked Order Group    chlorhexidine  (PERIDEX ) 0.12 % solution 15 mL    Oral care mouth rinse   DISCONTD: fentaNYL (SUBLIMAZE) injection 25-50 mcg   DISCONTD: acetaminophen  (OFIRMEV ) IV 1,000 mg    IV acetaminophen  for PACU patients (adjunct analgesic):   Yes   DISCONTD: water  for irrigation, sterile for irrigation SOLN   DISCONTD: iohexol  (OMNIPAQUE ) 300 MG/ML solution   influenza vac split trivalent PF (FLUZONE) injection 0.5 mL    I have reviewed the patients home medicines and have made adjustments as needed  Problem List / ED Course: Problem List Items Addressed This Visit       Genitourinary   UTI (urinary tract infection) - Primary   Relevant Medications   azithromycin   (ZITHROMAX ) 500 mg in sodium chloride  0.9 % 250 mL IVPB (Start on 09/19/2024 10:00 PM)   cefTRIAXone  (ROCEPHIN ) 1 g in sodium chloride  0.9 % 100 mL IVPB (Start on 09/19/2024 10:00 PM)   influenza vac split trivalent PF (FLUZONE) injection 0.5 mL (Start on 09/20/2024 10:00 AM)     Other   Hypokalemia   Other Visit Diagnoses       Pneumonia of left lower lobe due to infectious organism       Relevant Medications   cefTRIAXone  (ROCEPHIN ) 1 g in sodium chloride  0.9 % 100 mL IVPB (Completed)   azithromycin  (ZITHROMAX ) 500 mg in sodium chloride  0.9 % 250 mL IVPB (Completed)   azithromycin  (ZITHROMAX ) 500 mg in sodium chloride  0.9 % 250 mL IVPB (Start on 09/19/2024 10:00 PM)   cefTRIAXone  (ROCEPHIN ) 1 g in sodium chloride  0.9 % 100 mL IVPB (Start on 09/19/2024 10:00 PM)   albuterol  (PROVENTIL ) (2.5 MG/3ML) 0.083% nebulizer solution 3 mL   influenza vac split trivalent PF (FLUZONE) injection 0.5 mL (Start on 09/20/2024 10:00 AM)     Calculus of renal pelvis       Relevant Medications   fentaNYL (SUBLIMAZE) injection 50 mcg (Completed)   fentaNYL (SUBLIMAZE) injection 12.5 mcg                   This note was created using dictation software, which may contain spelling or grammatical errors.    Franklyn Sid SAILOR, MD 09/19/24 [phone]

## 2024-09-18 NOTE — ED Triage Notes (Signed)
 Pt BIB [facility] from home c/o blood in urine. Per EMS, pt had 102 fever at home. Pt took tylenol  at home. Pt also c/o general malaise and non productive cough x1 week.  108/82 80 HR CBG 119

## 2024-09-19 ENCOUNTER — Observation Stay (HOSPITAL_COMMUNITY): Admitting: Certified Registered Nurse Anesthetist

## 2024-09-19 ENCOUNTER — Encounter (HOSPITAL_COMMUNITY)
Admission: EM | Disposition: A | Discharge status: Home Health | Payer: Self-pay | Source: Home / Self Care | Attending: Internal Medicine

## 2024-09-19 ENCOUNTER — Encounter (HOSPITAL_COMMUNITY): Payer: Self-pay | Admitting: Internal Medicine

## 2024-09-19 ENCOUNTER — Observation Stay (HOSPITAL_COMMUNITY)

## 2024-09-19 DIAGNOSIS — F418 Other specified anxiety disorders: Secondary | ICD-10-CM

## 2024-09-19 DIAGNOSIS — J440 Chronic obstructive pulmonary disease with acute lower respiratory infection: Secondary | ICD-10-CM | POA: Diagnosis present

## 2024-09-19 DIAGNOSIS — B182 Chronic viral hepatitis C: Secondary | ICD-10-CM | POA: Diagnosis present

## 2024-09-19 DIAGNOSIS — J9811 Atelectasis: Secondary | ICD-10-CM | POA: Diagnosis present

## 2024-09-19 DIAGNOSIS — J449 Chronic obstructive pulmonary disease, unspecified: Secondary | ICD-10-CM | POA: Diagnosis not present

## 2024-09-19 DIAGNOSIS — N200 Calculus of kidney: Secondary | ICD-10-CM | POA: Diagnosis not present

## 2024-09-19 DIAGNOSIS — B9620 Unspecified Escherichia coli [E. coli] as the cause of diseases classified elsewhere: Secondary | ICD-10-CM | POA: Diagnosis present

## 2024-09-19 DIAGNOSIS — I10 Essential (primary) hypertension: Secondary | ICD-10-CM

## 2024-09-19 DIAGNOSIS — R319 Hematuria, unspecified: Secondary | ICD-10-CM

## 2024-09-19 DIAGNOSIS — E876 Hypokalemia: Secondary | ICD-10-CM

## 2024-09-19 DIAGNOSIS — A419 Sepsis, unspecified organism: Secondary | ICD-10-CM | POA: Diagnosis present

## 2024-09-19 DIAGNOSIS — N202 Calculus of kidney with calculus of ureter: Secondary | ICD-10-CM | POA: Diagnosis present

## 2024-09-19 DIAGNOSIS — E785 Hyperlipidemia, unspecified: Secondary | ICD-10-CM | POA: Diagnosis present

## 2024-09-19 DIAGNOSIS — Z8249 Family history of ischemic heart disease and other diseases of the circulatory system: Secondary | ICD-10-CM | POA: Diagnosis not present

## 2024-09-19 DIAGNOSIS — R599 Enlarged lymph nodes, unspecified: Secondary | ICD-10-CM | POA: Diagnosis present

## 2024-09-19 DIAGNOSIS — I482 Chronic atrial fibrillation, unspecified: Secondary | ICD-10-CM | POA: Diagnosis present

## 2024-09-19 DIAGNOSIS — N201 Calculus of ureter: Secondary | ICD-10-CM | POA: Diagnosis not present

## 2024-09-19 DIAGNOSIS — J189 Pneumonia, unspecified organism: Secondary | ICD-10-CM | POA: Diagnosis present

## 2024-09-19 DIAGNOSIS — Z886 Allergy status to analgesic agent status: Secondary | ICD-10-CM | POA: Diagnosis not present

## 2024-09-19 DIAGNOSIS — I69251 Hemiplegia and hemiparesis following other nontraumatic intracranial hemorrhage affecting right dominant side: Secondary | ICD-10-CM | POA: Diagnosis not present

## 2024-09-19 DIAGNOSIS — N3001 Acute cystitis with hematuria: Secondary | ICD-10-CM

## 2024-09-19 DIAGNOSIS — Z9049 Acquired absence of other specified parts of digestive tract: Secondary | ICD-10-CM | POA: Diagnosis not present

## 2024-09-19 DIAGNOSIS — Z885 Allergy status to narcotic agent status: Secondary | ICD-10-CM | POA: Diagnosis not present

## 2024-09-19 DIAGNOSIS — Z1152 Encounter for screening for COVID-19: Secondary | ICD-10-CM | POA: Diagnosis not present

## 2024-09-19 DIAGNOSIS — D649 Anemia, unspecified: Secondary | ICD-10-CM | POA: Diagnosis present

## 2024-09-19 DIAGNOSIS — I252 Old myocardial infarction: Secondary | ICD-10-CM | POA: Diagnosis not present

## 2024-09-19 DIAGNOSIS — F17210 Nicotine dependence, cigarettes, uncomplicated: Secondary | ICD-10-CM | POA: Diagnosis present

## 2024-09-19 DIAGNOSIS — Z23 Encounter for immunization: Secondary | ICD-10-CM | POA: Diagnosis not present

## 2024-09-19 DIAGNOSIS — N390 Urinary tract infection, site not specified: Secondary | ICD-10-CM | POA: Diagnosis not present

## 2024-09-19 DIAGNOSIS — Z7982 Long term (current) use of aspirin: Secondary | ICD-10-CM | POA: Diagnosis not present

## 2024-09-19 DIAGNOSIS — Z803 Family history of malignant neoplasm of breast: Secondary | ICD-10-CM | POA: Diagnosis not present

## 2024-09-19 DIAGNOSIS — N509 Disorder of male genital organs, unspecified: Secondary | ICD-10-CM | POA: Diagnosis not present

## 2024-09-19 HISTORY — PX: CYSTOSCOPY WITH STENT PLACEMENT: SHX5790

## 2024-09-19 LAB — BLOOD CULTURE ID PANEL (REFLEXED) - BCID2

## 2024-09-19 LAB — CBC
HCT: 33.5 % — ABNORMAL LOW (ref 39.0–52.0)
Hemoglobin: 11.3 g/dL — ABNORMAL LOW (ref 13.0–17.0)
MCH: 27.4 pg (ref 26.0–34.0)
MCHC: 33.7 g/dL (ref 30.0–36.0)
MCV: 81.1 fL (ref 80.0–100.0)
Platelets: 248 K/uL (ref 150–400)
RBC: 4.13 MIL/uL — ABNORMAL LOW (ref 4.22–5.81)
RDW: 15.5 % (ref 11.5–15.5)
WBC: 14.5 K/uL — ABNORMAL HIGH (ref 4.0–10.5)
nRBC: 0 % (ref 0.0–0.2)

## 2024-09-19 LAB — MAGNESIUM: Magnesium: 1.7 mg/dL (ref 1.7–2.4)

## 2024-09-19 LAB — BASIC METABOLIC PANEL WITH GFR
Anion gap: 12 (ref 5–15)
BUN: 9 mg/dL (ref 6–20)
CO2: 22 mmol/L (ref 22–32)
Calcium: 8.3 mg/dL — ABNORMAL LOW (ref 8.9–10.3)
Chloride: 105 mmol/L (ref 98–111)
Creatinine, Ser: 0.86 mg/dL (ref 0.61–1.24)
GFR, Estimated: >60 mL/min (ref >60)
Glucose, Bld: 100 mg/dL — ABNORMAL HIGH (ref 70–99)
Potassium: 3.5 mmol/L (ref 3.5–5.1)
Sodium: 139 mmol/L (ref 135–145)

## 2024-09-19 LAB — URINE CULTURE: Culture: <10000 — AB

## 2024-09-19 LAB — HIV ANTIBODY (ROUTINE TESTING W REFLEX): HIV Screen 4th Generation wRfx: NONREACTIVE

## 2024-09-19 SURGERY — CYSTOSCOPY, WITH STENT INSERTION
Anesthesia: General | Laterality: Left

## 2024-09-19 MED ORDER — PHENYLEPHRINE 80 MCG/ML (10ML) SYRINGE FOR IV PUSH (FOR BLOOD PRESSURE SUPPORT)
PREFILLED_SYRINGE | INTRAVENOUS | Status: AC
Start: 2024-09-19 — End: 2024-09-19
  Filled 2024-09-19: qty 10

## 2024-09-19 MED ORDER — DEXAMETHASONE SOD PHOSPHATE PF 10 MG/ML IJ SOLN
INTRAMUSCULAR | Status: DC | PRN
  Administered 2024-09-19: 5 mg via INTRAVENOUS

## 2024-09-19 MED ORDER — TRAZODONE HCL 50 MG PO TABS
50.0000 mg | ORAL_TABLET | Freq: Every evening | ORAL | Status: DC | PRN
  Administered 2024-09-20: 50 mg via ORAL
  Filled 2024-09-19: qty 1

## 2024-09-19 MED ORDER — ROCURONIUM BROMIDE 10 MG/ML (PF) SYRINGE
PREFILLED_SYRINGE | INTRAVENOUS | Status: DC | PRN
  Administered 2024-09-19: 60 mg via INTRAVENOUS

## 2024-09-19 MED ORDER — FENTANYL CITRATE (PF) 100 MCG/2ML IJ SOLN
INTRAMUSCULAR | Status: DC | PRN
  Administered 2024-09-19: 50 ug via INTRAVENOUS

## 2024-09-19 MED ORDER — ACETAMINOPHEN 650 MG RE SUPP: 650.0000 mg | Freq: Four times a day (QID) | RECTAL | Status: DC | PRN

## 2024-09-19 MED ORDER — LACTATED RINGERS IV SOLN: INTRAVENOUS | Status: DC

## 2024-09-19 MED ORDER — FENTANYL CITRATE (PF) 50 MCG/ML IJ SOSY
50.0000 ug | PREFILLED_SYRINGE | Freq: Once | INTRAMUSCULAR | Status: AC
  Administered 2024-09-19: 50 ug via INTRAVENOUS
  Filled 2024-09-19: qty 1

## 2024-09-19 MED ORDER — SODIUM CHLORIDE 0.9 % IV SOLN: INTRAVENOUS | Status: AC

## 2024-09-19 MED ORDER — MIDAZOLAM HCL 2 MG/2ML IJ SOLN
INTRAMUSCULAR | Status: AC
  Filled 2024-09-19: qty 2

## 2024-09-19 MED ORDER — ONDANSETRON HCL 4 MG/2ML IJ SOLN
INTRAMUSCULAR | Status: AC
Start: 2024-09-19 — End: 2024-09-19
  Filled 2024-09-19: qty 2

## 2024-09-19 MED ORDER — PROPOFOL 10 MG/ML IV BOLUS
INTRAVENOUS | Status: DC | PRN
  Administered 2024-09-19: 150 mg via INTRAVENOUS

## 2024-09-19 MED ORDER — PROPOFOL 10 MG/ML IV BOLUS
INTRAVENOUS | Status: AC
  Filled 2024-09-19: qty 20

## 2024-09-19 MED ORDER — FENTANYL CITRATE (PF) 100 MCG/2ML IJ SOLN
INTRAMUSCULAR | Status: AC
  Filled 2024-09-19: qty 2

## 2024-09-19 MED ORDER — LIDOCAINE 2% (20 MG/ML) 5 ML SYRINGE
INTRAMUSCULAR | Status: AC
  Filled 2024-09-19: qty 5

## 2024-09-19 MED ORDER — IOHEXOL 300 MG/ML  SOLN
INTRAMUSCULAR | Status: DC | PRN
  Administered 2024-09-19: 5 mL

## 2024-09-19 MED ORDER — FENTANYL CITRATE (PF) 100 MCG/2ML IJ SOLN: 25.0000 ug | INTRAMUSCULAR | Status: DC | PRN

## 2024-09-19 MED ORDER — ONDANSETRON HCL 4 MG/2ML IJ SOLN
INTRAMUSCULAR | Status: DC | PRN
  Administered 2024-09-19: 4 mg via INTRAVENOUS

## 2024-09-19 MED ORDER — CHLORHEXIDINE GLUCONATE 0.12 % MT SOLN
15.0000 mL | Freq: Once | OROMUCOSAL | Status: AC
  Administered 2024-09-19: 15 mL via OROMUCOSAL

## 2024-09-19 MED ORDER — ACETAMINOPHEN 10 MG/ML IV SOLN: 1000.0000 mg | Freq: Once | INTRAVENOUS | Status: DC | PRN

## 2024-09-19 MED ORDER — MIDAZOLAM HCL (PF) 2 MG/2ML IJ SOLN
INTRAMUSCULAR | Status: DC | PRN
  Administered 2024-09-19: 2 mg via INTRAVENOUS

## 2024-09-19 MED ORDER — ESMOLOL HCL 100 MG/10ML IV SOLN
INTRAVENOUS | Status: DC | PRN
  Administered 2024-09-19: 60 mg via INTRAVENOUS
  Administered 2024-09-19: 40 mg via INTRAVENOUS

## 2024-09-19 MED ORDER — WATER FOR IRRIGATION, STERILE IR SOLN
Status: DC | PRN
  Administered 2024-09-19: 3000 mL

## 2024-09-19 MED ORDER — ALBUTEROL SULFATE (2.5 MG/3ML) 0.083% IN NEBU: 3.0000 mL | INHALATION_SOLUTION | Freq: Four times a day (QID) | RESPIRATORY_TRACT | Status: DC | PRN

## 2024-09-19 MED ORDER — SODIUM CHLORIDE 0.9 % IV SOLN
1.0000 g | INTRAVENOUS | Status: DC
  Administered 2024-09-19: 1 g via INTRAVENOUS
  Filled 2024-09-19: qty 10

## 2024-09-19 MED ORDER — ORAL CARE MOUTH RINSE: 15.0000 mL | Freq: Once | OROMUCOSAL | Status: AC

## 2024-09-19 MED ORDER — LIDOCAINE 2% (20 MG/ML) 5 ML SYRINGE
INTRAMUSCULAR | Status: DC | PRN
  Administered 2024-09-19: 60 mg via INTRAVENOUS

## 2024-09-19 MED ORDER — NALOXONE HCL 0.4 MG/ML IJ SOLN: 0.4000 mg | INTRAMUSCULAR | Status: DC | PRN

## 2024-09-19 MED ORDER — SUGAMMADEX SODIUM 200 MG/2ML IV SOLN
INTRAVENOUS | Status: DC | PRN
  Administered 2024-09-19 (×2): 100 mg via INTRAVENOUS

## 2024-09-19 MED ORDER — INFLUENZA VIRUS VACC SPLIT PF (FLUZONE) 0.5 ML IM SUSY
0.5000 mL | PREFILLED_SYRINGE | INTRAMUSCULAR | Status: AC
  Administered 2024-09-21: 0.5 mL via INTRAMUSCULAR
  Filled 2024-09-19: qty 0.5

## 2024-09-19 MED ORDER — ROCURONIUM BROMIDE 10 MG/ML (PF) SYRINGE
PREFILLED_SYRINGE | INTRAVENOUS | Status: AC
Start: 2024-09-19 — End: 2024-09-19
  Filled 2024-09-19: qty 10

## 2024-09-19 MED ORDER — ACETAMINOPHEN 325 MG PO TABS
650.0000 mg | ORAL_TABLET | Freq: Four times a day (QID) | ORAL | Status: DC | PRN
  Administered 2024-09-19 – 2024-09-21 (×5): 650 mg via ORAL
  Filled 2024-09-19 (×5): qty 2

## 2024-09-19 MED ORDER — PHENYLEPHRINE 80 MCG/ML (10ML) SYRINGE FOR IV PUSH (FOR BLOOD PRESSURE SUPPORT)
PREFILLED_SYRINGE | INTRAVENOUS | Status: DC | PRN
  Administered 2024-09-19: 160 ug via INTRAVENOUS

## 2024-09-19 MED ORDER — SODIUM CHLORIDE 0.9 % IV SOLN
500.0000 mg | INTRAVENOUS | Status: DC
  Administered 2024-09-19 – 2024-09-20 (×2): 500 mg via INTRAVENOUS
  Filled 2024-09-19 (×2): qty 5

## 2024-09-19 MED ORDER — FENTANYL CITRATE (PF) 50 MCG/ML IJ SOSY
12.5000 ug | PREFILLED_SYRINGE | INTRAMUSCULAR | Status: DC | PRN
  Administered 2024-09-19 – 2024-09-21 (×4): 12.5 ug via INTRAVENOUS
  Filled 2024-09-19 (×4): qty 1

## 2024-09-19 SURGICAL SUPPLY — 19 items
BAG DRAIN URO-CYSTO SKYTR STRL (DRAIN) ×1 IMPLANT
BAG URINE DRAIN 2000ML AR STRL (UROLOGICAL SUPPLIES) ×1 IMPLANT
CATH FOLEY 2WAY SLVR 5CC 16FR (CATHETERS) IMPLANT
CATH URETL OPEN END 6FR 70 (CATHETERS) ×1 IMPLANT
GLOVE BIO SURGEON STRL SZ7.5 (GLOVE) ×1 IMPLANT
GOWN STRL REUS W/ TWL LRG LVL3 (GOWN DISPOSABLE) ×1 IMPLANT
GOWN STRL REUS W/ TWL XL LVL3 (GOWN DISPOSABLE) ×1 IMPLANT
GUIDEWIRE ANG ZIPWIRE 038X150 (WIRE) IMPLANT
GUIDEWIRE STR DUAL SENSOR (WIRE) ×1 IMPLANT
KIT TURNOVER KIT B (KITS) ×1 IMPLANT
MANIFOLD NEPTUNE II (INSTRUMENTS) ×1 IMPLANT
PACK CYSTO (CUSTOM PROCEDURE TRAY) ×1 IMPLANT
SOLN 0.9% NACL POUR BTL 1000ML (IV SOLUTION) ×1 IMPLANT
STENT URET 6FRX24 CONTOUR (STENTS) IMPLANT
STENT URET 6FRX26 CONTOUR (STENTS) IMPLANT
SYPHON OMNI JUG (MISCELLANEOUS) ×1 IMPLANT
TOWEL GREEN STERILE FF (TOWEL DISPOSABLE) ×1 IMPLANT
TUBE CONNECTING 12X1/4 (SUCTIONS) IMPLANT
WATER STERILE IRR 3000ML UROMA (IV SOLUTION) ×1 IMPLANT

## 2024-09-19 NOTE — Progress Notes (Signed)
 PHARMACY - PHYSICIAN COMMUNICATION CRITICAL VALUE ALERT - BLOOD CULTURE IDENTIFICATION (BCID)  Jason Romero is an 53 y.o. male who presented to [facility] on [facility]/18/2024 with a chief complaint of hematuria, cough, malaise, flank pain.   Assessment:  BCID positive for GNR ecoli no resistance 1/4 anaerobic bottles. Likely urinary source.   Name of physician (or Provider) Contacted: Dr. Odell Castor   Current antibiotics: Ceftriaxone , azithromycin    Changes to prescribed antibiotics recommended:  Patient is on recommended antibiotics - No changes needed  Results for orders placed or performed during the hospital encounter of 09/18/24  Blood Culture ID Panel (Reflexed) (Collected: 09/18/2024  9:12 PM)  Result Value Ref Range   Enterococcus faecalis NOT DETECTED NOT DETECTED   Enterococcus Faecium NOT DETECTED NOT DETECTED   Listeria monocytogenes NOT DETECTED NOT DETECTED   Staphylococcus species NOT DETECTED NOT DETECTED   Staphylococcus aureus (BCID) NOT DETECTED NOT DETECTED   Staphylococcus epidermidis NOT DETECTED NOT DETECTED   Staphylococcus lugdunensis NOT DETECTED NOT DETECTED   Streptococcus species NOT DETECTED NOT DETECTED   Streptococcus agalactiae NOT DETECTED NOT DETECTED   Streptococcus pneumoniae NOT DETECTED NOT DETECTED   Streptococcus pyogenes NOT DETECTED NOT DETECTED   A.calcoaceticus-baumannii NOT DETECTED NOT DETECTED   Bacteroides fragilis NOT DETECTED NOT DETECTED   Enterobacterales DETECTED (A) NOT DETECTED   Enterobacter cloacae complex NOT DETECTED NOT DETECTED   Escherichia coli DETECTED (A) NOT DETECTED   Klebsiella aerogenes NOT DETECTED NOT DETECTED   Klebsiella oxytoca NOT DETECTED NOT DETECTED   Klebsiella pneumoniae NOT DETECTED NOT DETECTED   Proteus species NOT DETECTED NOT DETECTED   Salmonella species NOT DETECTED NOT DETECTED   Serratia marcescens NOT DETECTED NOT DETECTED   Haemophilus influenzae NOT DETECTED NOT DETECTED   Neisseria  meningitidis NOT DETECTED NOT DETECTED   Pseudomonas aeruginosa NOT DETECTED NOT DETECTED   Stenotrophomonas maltophilia NOT DETECTED NOT DETECTED   Candida albicans NOT DETECTED NOT DETECTED   Candida auris NOT DETECTED NOT DETECTED   Candida glabrata NOT DETECTED NOT DETECTED   Candida krusei NOT DETECTED NOT DETECTED   Candida parapsilosis NOT DETECTED NOT DETECTED   Candida tropicalis NOT DETECTED NOT DETECTED   Cryptococcus neoformans/gattii NOT DETECTED NOT DETECTED   CTX-M ESBL NOT DETECTED NOT DETECTED   Carbapenem resistance IMP NOT DETECTED NOT DETECTED   Carbapenem resistance KPC NOT DETECTED NOT DETECTED   Carbapenem resistance NDM NOT DETECTED NOT DETECTED   Carbapenem resist OXA 48 LIKE NOT DETECTED NOT DETECTED   Carbapenem resistance VIM NOT DETECTED NOT DETECTED    Anniston Nellums M Litsy Epting 09/19/2024  2:15 PM

## 2024-09-19 NOTE — Progress Notes (Signed)
 SLP Cancellation Note  Patient Details Name: AYDIAN DIMMICK MRN: [id number] DOB: 07/25/1971   Cancelled treatment:       Pt at a medical procedure: pt recieving a cystoscopy and unavailable, ST to f/u when medically appropriate.                                                Manuelita Blew M.S. CCC-SLP

## 2024-09-19 NOTE — Consult Note (Signed)
 H&P Physician requesting consult: Jason Romero  Chief Complaint: Right ureteral stone, fever  History of Present Illness: 53 year old male with a history of CVA and right spastic hemiplegia presenting with gross hematuria and flank pain.  Reported a fever at home to 102.  Was noted to have leukocytosis of 16 and CT scan showed 1.8 cm right renal pelvic calculi with minimal hydronephrosis.  He continues to have right flank pain and tachycardia today.  Past Medical History:  Diagnosis Date   A-fib [facility])    Bilateral impacted cerumen 05/08/2022   Chronic headaches    COPD (chronic obstructive pulmonary disease) (HCC)    GERD (gastroesophageal reflux disease)    History of blood transfusion    Hypertension    MI (myocardial infarction) (HCC)    Murmur 02/12/2021   Seizures (HCC)    Stroke [facility])    Past Surgical History:  Procedure Laterality Date   CHOLECYSTECTOMY      Home Medications:  (Not in a hospital admission)  Allergies:  Allergies  Allergen Reactions   Hydrocodone Hives   Ibuprofen Other (See Comments)    Pt can't take this medication because it interacts with other medications that he is taking.     Family History  Problem Relation Age of Onset   Alcoholism Other    Arthritis Other    Breast cancer Other    Heart disease Mother    Social History:  reports that he has been smoking cigarettes. He has never used smokeless tobacco. He reports that he does not currently use alcohol. He reports current drug use. Drug: Marijuana.  ROS: A complete review of systems was performed.  All systems are negative except for pertinent findings as noted. ROS   Physical Exam:  Vital signs in last 24 hours: Temp:  [97.5 F (36.4 C)-99.3 F (37.4 C)] 99.3 F (37.4 C) (11/08 9391) Pulse Rate:  [86-126] 100 (11/08 0645) Resp:  [5-22] 15 (11/08 0645) BP: (90-124)/(57-82) 114/67 (11/08 0645) SpO2:  [90 %-100 %] 96 % (11/08 0645) Weight:  [896 kg] 103 kg (11/07  1959) General:  Alert and oriented, No acute distress HEENT: Normocephalic, atraumatic Neck: No JVD or lymphadenopathy Cardiovascular: Regular rate and rhythm Lungs: Regular rate and effort Abdomen: Soft, nontender, nondistended, no abdominal masses Back: No CVA tenderness Extremities: No edema Neurologic: Grossly intact  Laboratory Data:  Results for orders placed or performed during the hospital encounter of 09/18/24 (from the past 24 hours)  Urinalysis, Routine w reflex microscopic -Urine, Clean Catch     Status: Abnormal   Collection Time: 09/18/24  8:25 PM  Result Value Ref Range   Color, Urine YELLOW YELLOW   APPearance CLOUDY (A) CLEAR   Specific Gravity, Urine 1.009 1.005 - 1.030   pH 6.0 5.0 - 8.0   Glucose, UA NEGATIVE NEGATIVE mg/dL   Hgb urine dipstick MODERATE (A) NEGATIVE   Bilirubin Urine NEGATIVE NEGATIVE   Ketones, ur NEGATIVE NEGATIVE mg/dL   Protein, ur 899 (A) NEGATIVE mg/dL   Nitrite NEGATIVE NEGATIVE   Leukocytes,Ua LARGE (A) NEGATIVE   RBC / HPF >50 0 - 5 RBC/hpf   WBC, UA >50 0 - 5 WBC/hpf   Bacteria, UA FEW (A) NONE SEEN   Squamous Epithelial / HPF 0-5 0 - 5 /HPF   WBC Clumps PRESENT    Mucus PRESENT   CBG monitoring, ED     Status: Abnormal   Collection Time: 09/18/24  8:40 PM  Result Value Ref Range  Glucose-Capillary 108 (H) 70 - 99 mg/dL  Comprehensive metabolic panel     Status: Abnormal   Collection Time: 09/18/24  9:12 PM  Result Value Ref Range   Sodium 137 135 - 145 mmol/L   Potassium 3.0 (L) 3.5 - 5.1 mmol/L   Chloride 104 98 - 111 mmol/L   CO2 20 (L) 22 - 32 mmol/L   Glucose, Bld 111 (H) 70 - 99 mg/dL   BUN 10 6 - 20 mg/dL   Creatinine, Ser 8.94 0.61 - 1.24 mg/dL   Calcium  8.4 (L) 8.9 - 10.3 mg/dL   Total Protein 6.6 6.5 - 8.1 g/dL   Albumin 3.1 (L) 3.5 - 5.0 g/dL   AST 12 (L) 15 - 41 U/L   ALT 11 0 - 44 U/L   Alkaline Phosphatase 64 38 - 126 U/L   Total Bilirubin 0.9 0.0 - 1.2 mg/dL   GFR, Estimated >39 >39 mL/min   Anion  gap 13 5 - 15  CBC     Status: Abnormal   Collection Time: 09/18/24  9:12 PM  Result Value Ref Range   WBC 16.2 (H) 4.0 - 10.5 K/uL   RBC 4.31 4.22 - 5.81 MIL/uL   Hemoglobin 11.7 (L) 13.0 - 17.0 g/dL   HCT 64.4 (L) 60.9 - 47.9 %   MCV 82.4 80.0 - 100.0 fL   MCH 27.1 26.0 - 34.0 pg   MCHC 33.0 30.0 - 36.0 g/dL   RDW 84.5 88.4 - 84.4 %   Platelets 266 150 - 400 K/uL   nRBC 0.0 0.0 - 0.2 %  Resp panel by RT-PCR (RSV, Flu A&B, Covid) Anterior Nasal Swab     Status: None   Collection Time: 09/18/24  9:12 PM   Specimen: Anterior Nasal Swab  Result Value Ref Range   SARS Coronavirus 2 by RT PCR NEGATIVE NEGATIVE   Influenza A by PCR NEGATIVE NEGATIVE   Influenza B by PCR NEGATIVE NEGATIVE   Resp Syncytial Virus by PCR NEGATIVE NEGATIVE  Blood culture (routine x 2)     Status: None (Preliminary result)   Collection Time: 09/18/24  9:12 PM   Specimen: BLOOD LEFT ARM  Result Value Ref Range   Specimen Description BLOOD LEFT ARM    Special Requests      BOTTLES DRAWN AEROBIC AND ANAEROBIC Blood Culture adequate volume   Culture      NO GROWTH < 12 HOURS Performed at [facility], [address], [place], [place] [address]    Report Status PENDING   Blood culture (routine x 2)     Status: None (Preliminary result)   Collection Time: 09/18/24  9:12 PM   Specimen: BLOOD RIGHT ARM  Result Value Ref Range   Specimen Description BLOOD RIGHT ARM    Special Requests      BOTTLES DRAWN AEROBIC AND ANAEROBIC Blood Culture adequate volume   Culture      NO GROWTH < 12 HOURS Performed at [facility], [address], [place], [place] [address]    Report Status PENDING   I-Stat CG4 Lactic Acid     Status: None   Collection Time: 09/18/24  9:23 PM  Result Value Ref Range   Lactic Acid, Venous 0.9 0.5 - 1.9 mmol/L  CBC     Status: Abnormal   Collection Time: 09/19/24  4:31 AM  Result Value Ref Range   WBC 14.5 (H) 4.0 - 10.5 K/uL   RBC 4.13 (L) 4.22 -  5.81 MIL/uL    Hemoglobin 11.3 (L) 13.0 - 17.0 g/dL   HCT 66.4 (L) 60.9 - 47.9 %   MCV 81.1 80.0 - 100.0 fL   MCH 27.4 26.0 - 34.0 pg   MCHC 33.7 30.0 - 36.0 g/dL   RDW 84.4 88.4 - 84.4 %   Platelets 248 150 - 400 K/uL   nRBC 0.0 0.0 - 0.2 %  Basic metabolic panel     Status: Abnormal   Collection Time: 09/19/24  4:31 AM  Result Value Ref Range   Sodium 139 135 - 145 mmol/L   Potassium 3.5 3.5 - 5.1 mmol/L   Chloride 105 98 - 111 mmol/L   CO2 22 22 - 32 mmol/L   Glucose, Bld 100 (H) 70 - 99 mg/dL   BUN 9 6 - 20 mg/dL   Creatinine, Ser 9.13 0.61 - 1.24 mg/dL   Calcium  8.3 (L) 8.9 - 10.3 mg/dL   GFR, Estimated >39 >39 mL/min   Anion gap 12 5 - 15  Magnesium     Status: None   Collection Time: 09/19/24  4:31 AM  Result Value Ref Range   Magnesium 1.7 1.7 - 2.4 mg/dL   Recent Results (from the past 240 hours)  Resp panel by RT-PCR (RSV, Flu A&B, Covid) Anterior Nasal Swab     Status: None   Collection Time: 09/18/24  9:12 PM   Specimen: Anterior Nasal Swab  Result Value Ref Range Status   SARS Coronavirus 2 by RT PCR NEGATIVE NEGATIVE Final   Influenza A by PCR NEGATIVE NEGATIVE Final   Influenza B by PCR NEGATIVE NEGATIVE Final    Comment: (NOTE) The Xpert Xpress SARS-CoV-2/FLU/RSV plus assay is intended as an aid in the diagnosis of influenza from Nasopharyngeal swab specimens and should not be used as a sole basis for treatment. Nasal washings and aspirates are unacceptable for Xpert Xpress SARS-CoV-2/FLU/RSV testing.  Fact Sheet for Patients: bloggercourse.com  Fact Sheet for Healthcare Providers: seriousbroker.it  This test is not yet approved or cleared by the [place]  FDA and has been authorized for detection and/or diagnosis of SARS-CoV-2 by FDA under an Emergency Use Authorization (EUA). This EUA will remain in effect (meaning this test can be used) for the duration of the COVID-19 declaration under Section 564(b)(1) of  the Act, 21 U.S.C. section 360bbb-3(b)(1), unless the authorization is terminated or revoked.     Resp Syncytial Virus by PCR NEGATIVE NEGATIVE Final    Comment: (NOTE) Fact Sheet for Patients: bloggercourse.com  Fact Sheet for Healthcare Providers: seriousbroker.it  This test is not yet approved or cleared by the [place]  FDA and has been authorized for detection and/or diagnosis of SARS-CoV-2 by FDA under an Emergency Use Authorization (EUA). This EUA will remain in effect (meaning this test can be used) for the duration of the COVID-19 declaration under Section 564(b)(1) of the Act, 21 U.S.C. section 360bbb-3(b)(1), unless the authorization is terminated or revoked.  Performed at [facility], [address] [place], [place] [address]   Blood culture (routine x 2)     Status: None (Preliminary result)   Collection Time: 09/18/24  9:12 PM   Specimen: BLOOD LEFT ARM  Result Value Ref Range Status   Specimen Description BLOOD LEFT ARM  Final   Special Requests   Final    BOTTLES DRAWN AEROBIC AND ANAEROBIC Blood Culture adequate volume   Culture   Final    NO GROWTH < 12 HOURS Performed at [facility], [address], [place], [place] [address]    Report Status PENDING  Incomplete  Blood culture (routine x 2)     Status: None (Preliminary result)   Collection Time: 09/18/24  9:12 PM   Specimen: BLOOD RIGHT ARM  Result Value Ref Range Status   Specimen Description BLOOD RIGHT ARM  Final   Special Requests   Final    BOTTLES DRAWN AEROBIC AND ANAEROBIC Blood Culture adequate volume   Culture   Final    NO GROWTH < 12 HOURS Performed at [facility], [address], [place], [place] [address]    Report Status PENDING  Incomplete   Creatinine: Recent Labs    09/18/24 2112 09/19/24 0431  CREATININE 1.05 0.86   CT scan personally reviewed and is detailed in history of present  illness  Impression/Assessment:  Right ureteral calculus Fever, possible urinary tract infection  Plan:  Will plan for cystoscopy with right retrograde pyelogram, right ureteral stent placement.  He has remained afebrile here but remains tachycardic and with flank pain.  Does not have hydronephrosis but stone is clearly migrated into the proximal ureter.  He will need definitive management of the stone after adequate treatment for UTI   Sherwood JONETTA Edison, III 09/19/2024, 9:07 AM

## 2024-09-19 NOTE — Anesthesia Procedure Notes (Signed)
 Procedure Name: Intubation Date/Time: 09/19/2024 12:55 PM  Performed by: Roddie Grate, CRNAPre-anesthesia Checklist: Patient identified, Emergency Drugs available, Suction available, Patient being monitored and Timeout performed Patient Re-evaluated:Patient Re-evaluated prior to induction Oxygen Delivery Method: Circle system utilized Preoxygenation: Pre-oxygenation with 100% oxygen Induction Type: IV induction Ventilation: Mask ventilation without difficulty, Two handed mask ventilation required and Oral airway inserted - appropriate to patient size Laryngoscope Size: Mac and 4 Grade View: Grade I Tube type: Oral Tube size: 7.5 mm Number of attempts: 1 Airway Equipment and Method: Stylet Placement Confirmation: ETT inserted through vocal cords under direct vision, positive ETCO2 and breath sounds checked- equal and bilateral Secured at: 23 cm Tube secured with: Tape Dental Injury: Teeth and Oropharynx as per pre-operative assessment  Comments: Smooth IV Induction. Eyes taped. 2 hand mask w/ OA. mask. DL x 1 with grade 1 view. Atraumatically placed, teeth and lip remain intact as pre-op. Secured with tape. Bilateral breath sounds +/=, EtCO2 +, Adequate TV, VSS.

## 2024-09-19 NOTE — Progress Notes (Signed)
 [facility] PROGRESS NOTE    Progress Note  Jason Romero  FMW:[id number] DOB: [birth year] DOA: 09/18/2024 PCP: Vicci Barnie NOVAK, MD     Brief Narrative:   Jason Romero is an 53 y.o. male past medical history of essential hypertension, hyperlipidemia CVA right spastic hemiplegia functional incontinence migraines chronic hepatitis C, chronic atrial fibrillation not on anticoagulation history of seizures, history of cannabis and tobacco use comes in presenting with cough malaise and hematuria, blood pressure was soft in the ED she was tachycardic with a white count of 16 potassium 3 respiratory panel was negative chest x-ray showed possible retrocardiac infiltrate CT renal showed right renal pelvis calculi 1.8 cm without hydronephrosis and a patchy right lower lobe airspace disease with numerous peritoneal and iliac lymph nodes   Assessment/Plan:   Sepsis of unclear source: With leukocytosis, cough and a fever. Patient comes in complaining of cough and malaise CT showed right lower lobe infiltrate, with leukocytosis.   UA shows significant hematuria.  CT shows a stone, see below for further details. Started empirically on Rocephin  and azithromycin . Blood cultures were sent. Urine culture was also sent. There is a concern about aspiration swallowing evaluation is pending.  Hematuria with right nephrolithiasis: Patient is complaining of right flank pain and intermittent hematuria for the past 2 days. UA showed 50 white blood cells and red cells. CT showed no hydronephrosis but 1 point centimeter stone. ED spoke with urology recommended to continue antibiotics n.p.o. after midnight for possible stent in the morning if not improving. Urine cultures are pending. Avoid Lovenox due to hematuria.  Mild hypokalemia: Try to keep potassium greater than 4 magnesium greater than 2.  Essential hypertension: Hold all antihypertensive medication.  History of CVA with residual right  spastic hemiplegia: Holding aspirin  for now, currently NPO.  COPD: Continue inhalers.  Chronic atrial fibrillation: Currently rate controlled not a candidate for anticoagulation due to history of intracranial hemorrhage.  Normocytic anemia: Hbg stable.   DVT prophylaxis: lovenox Family Communication:daughter Status is: Observation The patient remains OBS appropriate and will d/c before 2 midnights.    Code Status:     Code Status Orders  (From admission, onward)           Start     Ordered   09/19/24 0415  Full code  Continuous       Question:  By:  Answer:  Consent: discussion documented in EHR   09/19/24 0416           Code Status History     Date Active Date Inactive Code Status Order ID Comments User Context   09/20/2020 1617 10/19/2020 1528 Full Code [id number]  Pegge Toribio JINNY [name] Inpatient   09/20/2020 1617 09/20/2020 1617 Full Code [id number]  Pegge Toribio JINNY [name] Inpatient   09/11/2020 0738 09/20/2020 1605 Full Code [id number]  Gerome Katrinka JINNY, MD ED         IV Access:   Peripheral IV   Procedures and diagnostic studies:   CT Renal Stone Study Result Date: 09/18/2024 EXAM: CT UROGRAM 09/18/2024 11:43:03 PM TECHNIQUE: CT of the abdomen and pelvis was performed without the administration of intravenous contrast as per CT urogram protocol. Multiplanar reformatted images as well as MIP urogram images are provided for review. Automated exposure control, iterative reconstruction, and/or weight based adjustment of the mA/kV was utilized to reduce the radiation dose to as low as reasonably achievable. COMPARISON: None available. CLINICAL HISTORY: Abdominal/flank pain, stone suspected. FINDINGS: LOWER CHEST: There is  patchy atelectasis and airspace disease in the right lower lobe. LIVER: The liver is unremarkable. GALLBLADDER AND BILE DUCTS: The gallbladder is surgically absent. No biliary ductal dilatation. SPLEEN: No acute abnormality. PANCREAS: No  acute abnormality. ADRENAL GLANDS: No acute abnormality. KIDNEYS, URETERS AND BLADDER: There is a right renal pelvis calculus measuring 1.8 cm without hydronephrosis. No perinephric or periureteral stranding. Mild diffuse bladder wall thickening versus normal under distention. GI AND BOWEL: Stomach demonstrates no acute abnormality. There is no bowel obstruction. Appendix appears normal. PERITONEUM AND RETROPERITONEUM: No ascites. No free air. VASCULATURE: Aorta is normal in caliber. There are atherosclerotic calcifications of the aorta. LYMPH NODES: Numerous nonenlarged retroperitoneal and iliac chain lymph nodes. REPRODUCTIVE ORGANS: No acute abnormality. BONES AND SOFT TISSUES: No acute osseous abnormality. Mild bilateral neck periarticular fat stranding which is nonspecific. Small fat-containing bilateral inguinal hernias. IMPRESSION: 1. Right renal pelvis calculus measuring 1.8 cm without hydronephrosis. 2. Mild diffuse bladder wall thickening versus normal underdistention. Correlate clinically for cystitis. 3. Patchy right lower lobe atelectasis/airspace disease. 4. Status post cholecystectomy. 5. Numerous nonenlarged retroperitoneal and iliac chain lymph nodes, likely reactive. Electronically signed by: Greig Pique MD 09/18/2024 11:49 PM EST RP Workstation: [id number]   DG Chest 2 View Result Date: 09/18/2024 EXAM: 2 VIEW(S) XRAY OF THE CHEST 09/18/2024 08:58:13 PM COMPARISON: 5 / 4 / 23 CLINICAL HISTORY: cough, fever, malaise FINDINGS: LUNGS AND PLEURA: Low lung volumes. Retrocardiac atelectasis or pneumonia. No pulmonary edema. No pleural effusion. No pneumothorax. HEART AND MEDIASTINUM: Mild cardiomegaly. No acute abnormality of the mediastinal silhouette. BONES AND SOFT TISSUES: No acute osseous abnormality. IMPRESSION: 1. Hypoventilation with retrocardiac atelectasis or pneumonia. Electronically signed by: Norman Gatlin MD 09/18/2024 09:15 PM EST RP Workstation: [id number]     Medical  Consultants:   None.   Subjective:    Jason Romero relates he continues to have a cough.  Objective:    Vitals:   09/19/24 0515 09/19/24 0530 09/19/24 0545 09/19/24 0608  BP: 118/77 119/74 122/71 124/77  Pulse: 99 (!) 102 (!) 126 (!) 106  Resp: (!) 22 20 20 20   Temp:    99.3 F (37.4 C)  TempSrc:    Oral  SpO2: 98% 99% 98% 97%  Weight:      Height:       SpO2: 97 %   Intake/Output Summary (Last 24 hours) at 09/19/2024 0622 Last data filed at 09/19/2024 0230 Gross per 24 hour  Intake 1350 ml  Output --  Net 1350 ml   Filed Weights   09/18/24 1959  Weight: 103 kg    Exam: General exam: In no acute distress. Respiratory system: Good air movement and no crackles on physical exam Cardiovascular system: S1 & S2 heard, RRR. No JVD.SABRA  Gastrointestinal system: Abdomen is nondistended, soft and nontender.  Extremities: No pedal edema. Skin: No rashes, lesions or ulcers Psychiatry: Judgement and insight appear normal. Mood & affect appropriate.    Data Reviewed:    Labs: Basic Metabolic Panel: Recent Labs  Lab 09/18/24 2112 09/19/24 0431  NA 137 139  K 3.0* 3.5  CL 104 105  CO2 20* 22  GLUCOSE 111* 100*  BUN 10 9  CREATININE 1.05 0.86  CALCIUM  8.4* 8.3*  MG  --  1.7   GFR Estimated Creatinine Clearance: 125.7 mL/min (by C-G formula based on SCr of 0.86 mg/dL). Liver Function Tests: Recent Labs  Lab 09/18/24 2112  AST 12*  ALT 11  ALKPHOS 64  BILITOT 0.9  PROT 6.6  ALBUMIN 3.1*   No results for input(s): LIPASE, AMYLASE in the last 168 hours. No results for input(s): AMMONIA in the last 168 hours. Coagulation profile No results for input(s): INR, PROTIME in the last 168 hours. COVID-19 Labs  No results for input(s): DDIMER, FERRITIN, LDH, CRP in the last 72 hours.  Lab Results  Component Value Date   SARSCOV2NAA NEGATIVE 09/18/2024   SARSCOV2NAA NEGATIVE 09/11/2020    CBC: Recent Labs  Lab 09/18/24 2112  09/19/24 0431  WBC 16.2* 14.5*  HGB 11.7* 11.3*  HCT 35.5* 33.5*  MCV 82.4 81.1  PLT 266 248   Cardiac Enzymes: No results for input(s): CKTOTAL, CKMB, CKMBINDEX, TROPONINI in the last 168 hours. BNP (last 3 results) No results for input(s): PROBNP in the last 8760 hours. CBG: Recent Labs  Lab 09/18/24 2040  GLUCAP 108*   D-Dimer: No results for input(s): DDIMER in the last 72 hours. Hgb A1c: No results for input(s): HGBA1C in the last 72 hours. Lipid Profile: No results for input(s): CHOL, HDL, LDLCALC, TRIG, CHOLHDL, LDLDIRECT in the last 72 hours. Thyroid  function studies: No results for input(s): TSH, T4TOTAL, T3FREE, THYROIDAB in the last 72 hours.  Invalid input(s): FREET3 Anemia work up: No results for input(s): VITAMINB12, FOLATE, FERRITIN, TIBC, IRON, RETICCTPCT in the last 72 hours. Sepsis Labs: Recent Labs  Lab 09/18/24 2112 09/18/24 2123 09/19/24 0431  WBC 16.2*  --  14.5*  LATICACIDVEN  --  0.9  --    Microbiology Recent Results (from the past 240 hours)  Resp panel by RT-PCR (RSV, Flu A&B, Covid) Anterior Nasal Swab     Status: None   Collection Time: 09/18/24  9:12 PM   Specimen: Anterior Nasal Swab  Result Value Ref Range Status   SARS Coronavirus 2 by RT PCR NEGATIVE NEGATIVE Final   Influenza A by PCR NEGATIVE NEGATIVE Final   Influenza B by PCR NEGATIVE NEGATIVE Final    Comment: (NOTE) The Xpert Xpress SARS-CoV-2/FLU/RSV plus assay is intended as an aid in the diagnosis of influenza from Nasopharyngeal swab specimens and should not be used as a sole basis for treatment. Nasal washings and aspirates are unacceptable for Xpert Xpress SARS-CoV-2/FLU/RSV testing.  Fact Sheet for Patients: bloggercourse.com  Fact Sheet for Healthcare Providers: seriousbroker.it  This test is not yet approved or cleared by the [place]  FDA and has been  authorized for detection and/or diagnosis of SARS-CoV-2 by FDA under an Emergency Use Authorization (EUA). This EUA will remain in effect (meaning this test can be used) for the duration of the COVID-19 declaration under Section 564(b)(1) of the Act, 21 U.S.C. section 360bbb-3(b)(1), unless the authorization is terminated or revoked.     Resp Syncytial Virus by PCR NEGATIVE NEGATIVE Final    Comment: (NOTE) Fact Sheet for Patients: bloggercourse.com  Fact Sheet for Healthcare Providers: seriousbroker.it  This test is not yet approved or cleared by the [place]  FDA and has been authorized for detection and/or diagnosis of SARS-CoV-2 by FDA under an Emergency Use Authorization (EUA). This EUA will remain in effect (meaning this test can be used) for the duration of the COVID-19 declaration under Section 564(b)(1) of the Act, 21 U.S.C. section 360bbb-3(b)(1), unless the authorization is terminated or revoked.  Performed at [facility], [address] [place], [place] [address]      Medications:    Continuous Infusions:  azithromycin  (ZITHROMAX ) 500 mg in sodium chloride  0.9 % 250 mL IVPB     cefTRIAXone  (ROCEPHIN )  IV        LOS: 0 days   Jason Romero  [facility]  09/19/2024, 6:22 AM

## 2024-09-19 NOTE — Discharge Instructions (Signed)

## 2024-09-19 NOTE — Anesthesia Preprocedure Evaluation (Signed)
 Anesthesia Evaluation  Patient identified by MRN, date of birth, ID band Patient awake    Reviewed: Allergy & Precautions, NPO status , Patient's Chart, lab work & pertinent test results  History of Anesthesia Complications Negative for: history of anesthetic complications  Airway Mallampati: II  TM Distance: >3 FB Neck ROM: Limited    Dental  (+) Chipped, Loose, Poor Dentition, Dental Advisory Given   Pulmonary sleep apnea , pneumonia, unresolved, COPD, Current Smoker   breath sounds clear to auscultation       Cardiovascular hypertension, Pt. on medications (-) angina + Past MI  + Valvular Problems/Murmurs MR  Rhythm:Regular   1. Left ventricular ejection fraction, by estimation, is 50 to 55%. The  left ventricle has low normal function. The left ventricle has no regional  wall motion abnormalities. There is mild left ventricular hypertrophy.  Left ventricular diastolic  parameters are consistent with Grade II diastolic dysfunction  (pseudonormalization).   2. Right ventricular systolic function is normal. The right ventricular  size is normal.   3. Left atrial size was mildly dilated.   4. The mitral valve is normal in structure. Mild to moderate mitral valve  regurgitation. No evidence of mitral stenosis.   5. The aortic valve is normal in structure. Aortic valve regurgitation is  not visualized. No aortic stenosis is present.   6. The inferior vena cava is normal in size with greater than 50%  respiratory variability, suggesting right atrial pressure of 3 mmHg.      Neuro/Psych  Headaches, Seizures -,  PSYCHIATRIC DISORDERS Anxiety Depression    CVA, Residual Symptoms    GI/Hepatic ,GERD  Controlled,,(+) Hepatitis -, C  Endo/Other    Renal/GU Renal diseaseLab Results      Component                Value               Date                      NA                       139                 09/19/2024                K                         3.5                 09/19/2024                CO2                      22                  09/19/2024                GLUCOSE                  100 (H)             09/19/2024                BUN                      9  09/19/2024                CREATININE               0.86                09/19/2024                CALCIUM                   8.3 (L)             09/19/2024                GFR                      70.92               02/27/2017                EGFR                     82                  07/06/2024                GFRNONAA                 >60                 09/19/2024                Musculoskeletal   Abdominal   Peds  Hematology  (+) Blood dyscrasia, anemia Lab Results      Component                Value               Date                      WBC                      14.5 (H)            09/19/2024                HGB                      11.3 (L)            09/19/2024                HCT                      33.5 (L)            09/19/2024                MCV                      81.1                09/19/2024                PLT                      248                 09/19/2024  Anesthesia Other Findings   Reproductive/Obstetrics                              Anesthesia Physical Anesthesia Plan  ASA: 3  Anesthesia Plan: General   Post-op Pain Management: Minimal or no pain anticipated   Induction: Intravenous  PONV Risk Score and Plan: 1 and Ondansetron   Airway Management Planned: Oral ETT  Additional Equipment: None  Intra-op Plan:   Post-operative Plan: Extubation in OR  Informed Consent: I have reviewed the patients History and Physical, chart, labs and discussed the procedure including the risks, benefits and alternatives for the proposed anesthesia with the patient or authorized representative who has indicated his/her understanding and acceptance.     Dental advisory  given  Plan Discussed with: CRNA  Anesthesia Plan Comments:          Anesthesia Quick Evaluation

## 2024-09-19 NOTE — Transfer of Care (Signed)
 Immediate Anesthesia Transfer of Care Note  Patient: Jason Romero  Procedure(s) Performed: CYSTOSCOPY, WITH STENT INSERTION (Left)  Patient Location: PACU  Anesthesia Type:General  Level of Consciousness: awake, alert , and oriented  Airway & Oxygen Therapy: Patient Spontanous Breathing and Patient connected to nasal cannula oxygen  Post-op Assessment: Report given to RN and Post -op Vital signs reviewed and stable  Post vital signs: Reviewed and stable  Last Vitals:  Vitals Value Taken Time  BP 102/67 09/19/24 15:35  Temp 36.8 C 09/19/24 15:30  Pulse 109 09/19/24 15:40  Resp 16 09/19/24 15:40  SpO2 98 % 09/19/24 15:40  Vitals shown include unfiled device data.  Last Pain:  Vitals:   09/19/24 1535  TempSrc:   PainSc: 0-No pain         Complications: No notable events documented.

## 2024-09-19 NOTE — Op Note (Signed)
 Operative Note  Preoperative diagnosis:  1.  Right ureteropelvic junction calculus, urinary tract infection  Postoperative diagnosis: 1.  Right ureteropelvic junction calculus, urinary tract infection   Procedure(s): 1.  Cystoscopy with right retrograde pyelogram and right ureteral stent placement  Surgeon: Sherwood Edison, MD  Assistants: None  Anesthesia: General  Complications: None immediate  EBL: Minimal  Specimens: 1.  None  Drains/Catheters: 1.  6 X 26 double-J ureteral stent  Intraoperative findings: 1.  Normal urethra and bladder 2.  Right retrograde pyelogram revealed a filling defect at the level of the stone with minimal to no upstream hydroureteronephrosis  Indication: 53 year old male with a right 1.8 cm ureteropelvic junction calculus presents for ureteral stent placement.  Description of procedure:  The patient was identified and consent was obtained.  The patient was taken to the operating room and placed in the supine position.  The patient was placed under general anesthesia.  Perioperative antibiotics were administered.  The patient was placed in dorsal lithotomy.  Patient was prepped and draped in a standard sterile fashion and a timeout was performed.  A 21 French rigid cystoscope was advanced into the urethra and into the bladder.  The right distal most portion of the ureter was cannulated with an open-ended ureteral catheter.  Retrograde pyelogram was performed with the findings noted above.  A sensor wire was then advanced up to the kidney under fluoroscopic guidance.  A 6 X 26 double-J ureteral stent was advanced up to the kidney under fluoroscopic guidance.  The wire was withdrawn and fluoroscopy confirmed good proximal placement and direct visualization confirmed a good coil within the bladder.  The bladder was drained and the scope withdrawn.  This concluded the operation.  Patient tolerated procedure well and was stable postoperatively.  Plan: Continue  antibiotics.  Will work on getting him scheduled for ureteroscopy or PCNL in a couple weeks or so.

## 2024-09-19 NOTE — H&P (Addendum)
 History and Physical    Jason Romero FMW:[id number] DOB: 1970-12-10 DOA: 09/18/2024  PCP: Vicci Barnie NOVAK, MD  Patient coming from: Home  Chief Complaint: Cough  HPI: Jason Romero is a 53 y.o. male with medical history significant of hypertension, hyperlipidemia, CVA with residual right spastic hemiplegia, functional incontinence of urine and bowel, migraines, GERD, chronic hep C, COPD, OSA, chronic A-fib not on anticoagulation, seizures, chronic anemia, marijuana and tobacco abuse presenting with complaints of cough, malaise, and hematuria.  Patient is a poor historian.  Wife states patient has had chronic cough related to smoking cigarettes but has not smoked cigarettes for several days.  Patient does not think his cough is any worse than his baseline.  He denies rhinorrhea, shortness of breath, or chest pain.  Reports having intermittent hematuria for the past 2 days and since yesterday having fevers.  Also endorsing right sided flank pain and requesting pain medication.  Denies nausea, vomiting, or abdominal pain.  ED Course: Blood pressure soft on arrival and borderline tachycardic.  Afebrile.  Labs notable for WBC count 16.2, hemoglobin 11.7 (at baseline), MCV 82.4, potassium 3.0, bicarb 20, glucose 111, creatinine 1.0, COVID/influenza/RSV PCR negative, blood cultures in process, lactic acid normal.  No gross hematuria noted in the ED and UA showing large amount of leukocytes, >50 RBCs, >50 WBCs, and few bacteria.  Urine culture in process.  Chest x-ray showing hypoventilation with retrocardiac atelectasis or pneumonia.  CT renal stone study showing right renal pelvis calculus measuring 1.8 cm without hydronephrosis.  Also showing mild diffuse bladder wall thickening versus normal underdistention suspicious for possible cystitis and patchy right lower lobe atelectasis/airspace disease.  In addition, CT showing numerous nonenlarged retroperitoneal and iliac chain lymph nodes, likely  reactive.  Patient was given fentanyl, oral potassium, ceftriaxone , azithromycin , and 1 L LR.  EDP discussed the case with urologist Dr. Carolee who recommended keeping the patient n.p.o. for possible stent in the morning if not improving, continue antibiotics for now.  Review of Systems:  Review of Systems  All other systems reviewed and are negative.   Past Medical History:  Diagnosis Date   A-fib [facility])    Bilateral impacted cerumen 05/08/2022   Chronic headaches    COPD (chronic obstructive pulmonary disease) (HCC)    GERD (gastroesophageal reflux disease)    History of blood transfusion    Hypertension    MI (myocardial infarction) (HCC)    Murmur 02/12/2021   Seizures (HCC)    Stroke [facility])     Past Surgical History:  Procedure Laterality Date   CHOLECYSTECTOMY       reports that he has been smoking cigarettes. He has never used smokeless tobacco. He reports that he does not currently use alcohol. He reports current drug use. Drug: Marijuana.  Allergies  Allergen Reactions   Hydrocodone Hives   Ibuprofen Other (See Comments)    Pt can't take this medication because it interacts with other medications that he is taking.     Family History  Problem Relation Age of Onset   Alcoholism Other    Arthritis Other    Breast cancer Other    Heart disease Mother     Prior to Admission medications   Medication Sig Start Date End Date Taking? Authorizing Provider  acetaminophen  (TYLENOL ) 325 MG tablet Take 2 tablets (650 mg total) by mouth every 4 (four) hours as needed for mild pain (or temp > 37.5 C (99.5 F)). 10/18/20  Yes Angiulli, Toribio PARAS, PA-C  albuterol  (VENTOLIN  HFA) 108 (90 Base) MCG/ACT inhaler Inhale 2 puffs into the lungs every 6 (six) hours as needed for wheezing or shortness of breath. 07/01/24  Yes Vicci Barnie NOVAK, MD  amLODipine  (NORVASC ) 10 MG tablet Take 1 tablet (10 mg total) by mouth daily. 03/05/24  Yes Vicci Barnie NOVAK, MD  aspirin  EC 81 MG tablet Take 1  tablet (81 mg total) by mouth daily. Swallow whole. 05/08/22  Yes Brien Belvie BRAVO, MD  atorvastatin  (LIPITOR) 20 MG tablet Take 1 tablet (20 mg total) by mouth daily. 03/05/24  Yes Vicci Barnie NOVAK, MD  baclofen  (LIORESAL ) 20 MG tablet Take 1 tablet (20 mg total) by mouth 3 (three) times daily. 05/13/24  Yes Vicci Barnie NOVAK, MD  hydrALAZINE  (APRESOLINE ) 50 MG tablet Take 1 tablet (50 mg total) by mouth in the morning and at bedtime. 03/05/24  Yes Vicci Barnie NOVAK, MD  hydrOXYzine  (VISTARIL ) 50 MG capsule Take 1 capsule (50 mg total) by mouth every 8 (eight) hours as needed for anxiety. 09/14/24  Yes Vicci Barnie NOVAK, MD  labetalol  (NORMODYNE ) 300 MG tablet Take 1 tablet (300 mg total) by mouth 2 (two) times daily. 07/06/24  Yes Vicci Barnie NOVAK, MD  pregabalin  (LYRICA ) 150 MG capsule Take 1 capsule (150 mg total) by mouth 2 (two) times daily. 06/10/24  Yes Vicci Barnie NOVAK, MD  traZODone  (DESYREL ) 100 MG tablet Take 1.5 tablets (150 mg total) by mouth at bedtime as needed for sleep. 08/07/24  Yes Vicci Barnie NOVAK, MD  Blood Pressure Monitoring (BLOOD PRESSURE KIT) DEVI Use to monitor blood pressure 06/01/21   Brien Belvie BRAVO, MD    Physical Exam: Vitals:   09/19/24 0130 09/19/24 0200 09/19/24 0230 09/19/24 0300  BP:  106/72 105/76 112/82  Pulse:  94 89 90  Resp:  11 19 18   Temp:      TempSrc:      SpO2: 94% 90% 98% 98%  Weight:      Height:        Physical Exam Vitals reviewed.  Constitutional:      General: He is not in acute distress. HENT:     Head: Normocephalic and atraumatic.  Eyes:     Extraocular Movements: Extraocular movements intact.  Cardiovascular:     Rate and Rhythm: Normal rate and regular rhythm.     Heart sounds: Normal heart sounds.  Pulmonary:     Effort: Pulmonary effort is normal. No respiratory distress.     Breath sounds: No wheezing.  Abdominal:     General: Bowel sounds are normal.     Palpations: Abdomen is soft.     Tenderness: There is no  abdominal tenderness. There is no guarding.  Musculoskeletal:     Cervical back: Normal range of motion.     Right lower leg: No edema.     Left lower leg: No edema.  Skin:    General: Skin is warm and dry.  Neurological:     Mental Status: He is alert and oriented to person, place, and time.     Comments: Right hemiplegia (chronic)     Labs on Admission: I have personally reviewed following labs and imaging studies  CBC: Recent Labs  Lab 09/18/24 2112  WBC 16.2*  HGB 11.7*  HCT 35.5*  MCV 82.4  PLT 266   Basic Metabolic Panel: Recent Labs  Lab 09/18/24 2112  NA 137  K 3.0*  CL 104  CO2 20*  GLUCOSE 111*  BUN 10  CREATININE 1.05  CALCIUM  8.4*   GFR: Estimated Creatinine Clearance: 102.9 mL/min (by C-G formula based on SCr of 1.05 mg/dL). Liver Function Tests: Recent Labs  Lab 09/18/24 2112  AST 12*  ALT 11  ALKPHOS 64  BILITOT 0.9  PROT 6.6  ALBUMIN 3.1*   No results for input(s): LIPASE, AMYLASE in the last 168 hours. No results for input(s): AMMONIA in the last 168 hours. Coagulation Profile: No results for input(s): INR, PROTIME in the last 168 hours. Cardiac Enzymes: No results for input(s): CKTOTAL, CKMB, CKMBINDEX, TROPONINI in the last 168 hours. BNP (last 3 results) No results for input(s): PROBNP in the last 8760 hours. HbA1C: No results for input(s): HGBA1C in the last 72 hours. CBG: Recent Labs  Lab 09/18/24 2040  GLUCAP 108*   Lipid Profile: No results for input(s): CHOL, HDL, LDLCALC, TRIG, CHOLHDL, LDLDIRECT in the last 72 hours. Thyroid  Function Tests: No results for input(s): TSH, T4TOTAL, FREET4, T3FREE, THYROIDAB in the last 72 hours. Anemia Panel: No results for input(s): VITAMINB12, FOLATE, FERRITIN, TIBC, IRON, RETICCTPCT in the last 72 hours. Urine analysis:    Component Value Date/Time   COLORURINE YELLOW 09/18/2024 2025   APPEARANCEUR CLOUDY (A) 09/18/2024 2025    LABSPEC 1.009 09/18/2024 2025   PHURINE 6.0 09/18/2024 2025   GLUCOSEU NEGATIVE 09/18/2024 2025   HGBUR MODERATE (A) 09/18/2024 2025   BILIRUBINUR NEGATIVE 09/18/2024 2025   BILIRUBINUR small 03/22/2016 1331   KETONESUR NEGATIVE 09/18/2024 2025   PROTEINUR 100 (A) 09/18/2024 2025   UROBILINOGEN 0.2 03/22/2016 1331   NITRITE NEGATIVE 09/18/2024 2025   LEUKOCYTESUR LARGE (A) 09/18/2024 2025    Radiological Exams on Admission: CT Renal Stone Study Result Date: 09/18/2024 EXAM: CT UROGRAM 09/18/2024 11:43:03 PM TECHNIQUE: CT of the abdomen and pelvis was performed without the administration of intravenous contrast as per CT urogram protocol. Multiplanar reformatted images as well as MIP urogram images are provided for review. Automated exposure control, iterative reconstruction, and/or weight based adjustment of the mA/kV was utilized to reduce the radiation dose to as low as reasonably achievable. COMPARISON: None available. CLINICAL HISTORY: Abdominal/flank pain, stone suspected. FINDINGS: LOWER CHEST: There is patchy atelectasis and airspace disease in the right lower lobe. LIVER: The liver is unremarkable. GALLBLADDER AND BILE DUCTS: The gallbladder is surgically absent. No biliary ductal dilatation. SPLEEN: No acute abnormality. PANCREAS: No acute abnormality. ADRENAL GLANDS: No acute abnormality. KIDNEYS, URETERS AND BLADDER: There is a right renal pelvis calculus measuring 1.8 cm without hydronephrosis. No perinephric or periureteral stranding. Mild diffuse bladder wall thickening versus normal under distention. GI AND BOWEL: Stomach demonstrates no acute abnormality. There is no bowel obstruction. Appendix appears normal. PERITONEUM AND RETROPERITONEUM: No ascites. No free air. VASCULATURE: Aorta is normal in caliber. There are atherosclerotic calcifications of the aorta. LYMPH NODES: Numerous nonenlarged retroperitoneal and iliac chain lymph nodes. REPRODUCTIVE ORGANS: No acute abnormality.  BONES AND SOFT TISSUES: No acute osseous abnormality. Mild bilateral neck periarticular fat stranding which is nonspecific. Small fat-containing bilateral inguinal hernias. IMPRESSION: 1. Right renal pelvis calculus measuring 1.8 cm without hydronephrosis. 2. Mild diffuse bladder wall thickening versus normal underdistention. Correlate clinically for cystitis. 3. Patchy right lower lobe atelectasis/airspace disease. 4. Status post cholecystectomy. 5. Numerous nonenlarged retroperitoneal and iliac chain lymph nodes, likely reactive. Electronically signed by: Greig Pique MD 09/18/2024 11:49 PM EST RP Workstation: [id number]   DG Chest 2 View Result Date: 09/18/2024 EXAM: 2 VIEW(S) XRAY OF THE CHEST 09/18/2024 08:58:13 PM COMPARISON: 5 / 4 /  23 CLINICAL HISTORY: cough, fever, malaise FINDINGS: LUNGS AND PLEURA: Low lung volumes. Retrocardiac atelectasis or pneumonia. No pulmonary edema. No pleural effusion. No pneumothorax. HEART AND MEDIASTINUM: Mild cardiomegaly. No acute abnormality of the mediastinal silhouette. BONES AND SOFT TISSUES: No acute osseous abnormality. IMPRESSION: 1. Hypoventilation with retrocardiac atelectasis or pneumonia. Electronically signed by: Norman Gatlin MD 09/18/2024 09:15 PM EST RP Workstation: [id number]    EKG: Independently reviewed.  Rate controlled A-fib.  Assessment and Plan  Community acquired pneumonia vs ?aspiration pneumonia given history of previous stroke Patient is presenting with complaints of cough and malaise.  Chest x-ray showing retrocardiac atelectasis or pneumonia.  CT renal stone study showing patchy right lower lobe atelectasis/airspace disease.  Does have leukocytosis on labs but afebrile in the ED and lactate normal.  Blood pressure was soft on arrival but now improved after 1 L IV fluids.  Not tachycardic at present.  No signs of sepsis at this time.  COVID/influenza/RSV PCR negative.  Patient was satting 100% on room air on arrival to ED but later  placed on 3 L [place] as he desatted when sleeping although unclear how low his sats were at that time.  Likely has undiagnosed sleep apnea.  Currently stable on 3 L [place].  Continue ceftriaxone  and azithromycin .  Follow-up blood cultures and trend WBC count.  SLP eval.  UTI Right renal pelvic calculus Intermittent hematuria Patient is complaining of right flank pain and intermittent hematuria for the past 2 days.  No gross hematuria noted in the ED and UA showing large amount of leukocytes, >50 RBCs, >50 WBCs, and few bacteria.  CT showing right renal pelvic calculus measuring 1.8 cm without hydronephrosis and mild diffuse bladder wall thickening suspicious for cystitis.  Urology recommended continuing antibiotics for now and keeping n.p.o. after midnight for possible stent in the morning if not improving.  Follow-up urine culture.  Mild hypokalemia Monitor potassium and magnesium levels, replace as needed.  Hypertension Hold antihypertensives at this time.  History of CVA with residual right spastic hemiplegia Hold aspirin  at this time given intermittent hematuria/renal pelvic calculus/pending urology evaluation.  Continue statin and baclofen  when no longer NPO.  COPD Stable, no wheezing.  Continue albuterol  inhaler as needed.  Chronic A-fib Currently rate controlled.  Not on chronic anticoagulation due to history of ICH.  Chronic normocytic anemia Hemoglobin at baseline, monitor labs.  DVT prophylaxis: SCDs Code Status: Full Code (discussed with the patient) Family Communication: Wife at bedside. Level of care: Telemetry bed Admission status: It is my clinical opinion that referral for OBSERVATION is reasonable and necessary in this patient based on the above information provided. The aforementioned taken together are felt to place the patient at high risk for further clinical deterioration. However, it is anticipated that the patient may be medically stable for discharge from the hospital  within 24 to 48 hours.  Editha Ram MD [facility]  If 7PM-7AM, please contact night-coverage [email or web address]  09/19/2024, 3:20 AM

## 2024-09-20 DIAGNOSIS — N200 Calculus of kidney: Secondary | ICD-10-CM | POA: Diagnosis not present

## 2024-09-20 DIAGNOSIS — I10 Essential (primary) hypertension: Secondary | ICD-10-CM | POA: Diagnosis not present

## 2024-09-20 DIAGNOSIS — N3001 Acute cystitis with hematuria: Secondary | ICD-10-CM | POA: Diagnosis not present

## 2024-09-20 DIAGNOSIS — E876 Hypokalemia: Secondary | ICD-10-CM | POA: Diagnosis not present

## 2024-09-20 MED ORDER — PREGABALIN 75 MG PO CAPS
150.0000 mg | ORAL_CAPSULE | Freq: Two times a day (BID) | ORAL | Status: DC
  Administered 2024-09-20 – 2024-09-22 (×5): 150 mg via ORAL
  Filled 2024-09-20 (×5): qty 2

## 2024-09-20 MED ORDER — TRAZODONE HCL 50 MG PO TABS
150.0000 mg | ORAL_TABLET | Freq: Every evening | ORAL | Status: DC | PRN
  Administered 2024-09-21: 150 mg via ORAL
  Filled 2024-09-20: qty 3

## 2024-09-20 MED ORDER — SODIUM CHLORIDE 0.9 % IV SOLN
2.0000 g | INTRAVENOUS | Status: DC
  Administered 2024-09-20: 2 g via INTRAVENOUS
  Filled 2024-09-20: qty 20

## 2024-09-20 MED ORDER — ATORVASTATIN CALCIUM 10 MG PO TABS
20.0000 mg | ORAL_TABLET | Freq: Every day | ORAL | Status: DC
  Administered 2024-09-20 – 2024-09-22 (×3): 20 mg via ORAL
  Filled 2024-09-20 (×3): qty 2

## 2024-09-20 MED ORDER — ENOXAPARIN SODIUM 40 MG/0.4ML IJ SOSY
40.0000 mg | PREFILLED_SYRINGE | INTRAMUSCULAR | Status: DC
  Administered 2024-09-20 – 2024-09-22 (×3): 40 mg via SUBCUTANEOUS
  Filled 2024-09-20 (×3): qty 0.4

## 2024-09-20 MED ORDER — POTASSIUM CHLORIDE 20 MEQ PO PACK
40.0000 meq | PACK | Freq: Once | ORAL | Status: AC
  Administered 2024-09-20: 40 meq via ORAL
  Filled 2024-09-20: qty 2

## 2024-09-20 MED ORDER — LABETALOL HCL 200 MG PO TABS
100.0000 mg | ORAL_TABLET | Freq: Two times a day (BID) | ORAL | Status: DC
  Administered 2024-09-20 – 2024-09-22 (×5): 100 mg via ORAL
  Filled 2024-09-20 (×5): qty 1

## 2024-09-20 MED ORDER — BACLOFEN 10 MG PO TABS
20.0000 mg | ORAL_TABLET | Freq: Three times a day (TID) | ORAL | Status: DC
  Administered 2024-09-20 – 2024-09-22 (×7): 20 mg via ORAL
  Filled 2024-09-20 (×7): qty 2

## 2024-09-20 MED ORDER — MAGNESIUM SULFATE 2 GM/50ML IV SOLN
2.0000 g | Freq: Once | INTRAVENOUS | Status: AC
  Administered 2024-09-20: 2 g via INTRAVENOUS
  Filled 2024-09-20: qty 50

## 2024-09-20 NOTE — Progress Notes (Signed)
 SLP Cancellation Note  Patient Details Name: Jason Romero MRN: [id number] DOB: May 13, 1971   Cancelled treatment:       Reason Eval/Treat Not Completed: Patient declined any difficulty swallowing since SLP interventions in AIR 2021 s/p L basal ganglia IPH. He reports he had just gotten comfortable and was hoping to rest, asking SLP to come back at a later time. Will f/u as schedule permits.   Damien Blumenthal, M.A., CCC-SLP Speech Language Pathology, Acute Rehabilitation Services  Secure Chat preferred [phone]  09/20/2024, 9:58 AM

## 2024-09-20 NOTE — Evaluation (Signed)
 Physical Therapy Evaluation Patient Details Name: Jason Romero MRN: [id number] DOB: [birth year] Today's Date: 09/20/2024  History of Present Illness  The pt is a 53 yo male presenting 11/7 with blood in urine. S/p cystoscopy with stent placement 11/8. PMH includes: HTN, HLD, CVA with R-sided hemiplegia, migranes, GERD, hep C, COPD, OSA, afib not on anticoagulation, seizures, and anemia.   Clinical Impression  Pt in bed upon arrival of PT, agreeable to evaluation at this time. Prior to admission the pt was living at home with assist from his wife for transfers OOB, however, she reports the stand-pivot transfer has gotten progressively more challenging in last few months and she is not often able to complete transfer to his WC. The pt presents with deficits in seated balance, LLE strength, awareness, and activity tolerance in addition to chronic R hemiparesis. The pt required mod-maxA to complete sit-stand transfers from EOB with blocking of R knee and support under LUE with locked recliner. Pt with limited standing tolerance (3-5 seconds) and HR increased to 130s with standing. Given pt needing more assist for transfers than his wife is able to provide, recommend continued skilled PT acutely, and post-acute inpatient therapies to facilitate improved LLE strength and capacity for transfers.       If plan is discharge home, recommend the following: Two people to help with walking and/or transfers;A lot of help with bathing/dressing/bathroom;Assistance with cooking/housework;Direct supervision/assist for medications management;Direct supervision/assist for financial management;Assist for transportation;Help with stairs or ramp for entrance;Supervision due to cognitive status   Can travel by private vehicle   No    Equipment Recommendations Wheelchair (measurements PT);Wheelchair cushion (measurements PT);BSC/3in1;Hoyer lift (WC with removable armrests, drop-arm BSC)  Recommendations for Other  Services  OT consult    Functional Status Assessment Patient has had a recent decline in their functional status and demonstrates the ability to make significant improvements in function in a reasonable and predictable amount of time.     Precautions / Restrictions Precautions Precautions: Fall Recall of Precautions/Restrictions: Intact Precaution/Restrictions Comments: chronic R hemi Restrictions Weight Bearing Restrictions Per Provider Order: No      Mobility  Bed Mobility Overal bed mobility: Needs Assistance Bed Mobility: Rolling, Supine to Sit, Sit to Supine Rolling: Min assist, Used rails   Supine to sit: Mod assist, HOB elevated Sit to supine: Mod assist   General bed mobility comments: min-modA to manage RLE and trunk elevation    Transfers Overall transfer level: Needs assistance Equipment used: 1 person hand held assist (LUE supported on locked recliner) Transfers: Sit to/from Stand Sit to Stand: Max assist, Mod assist           General transfer comment: initially unable to generate hip lift with +1 assist from EOB, then used locked recliner for pt to pull up on and tolerated with modA x2 from EOB. HR to 130s with 3-5 sec standing tolerance, R knee blocked    Ambulation/Gait               General Gait Details: unable to step   Modified Rankin (Stroke Patients Only) Modified Rankin (Stroke Patients Only) Pre-Morbid Rankin Score: Severe disability Modified Rankin: Severe disability     Balance Overall balance assessment: Needs assistance Sitting-balance support: Single extremity supported, Feet supported Sitting balance-Leahy Scale: Poor Sitting balance - Comments: R and posterior lean Postural control: Right lateral lean, Posterior lean Standing balance support: Single extremity supported, During functional activity Standing balance-Leahy Scale: Zero Standing balance comment: dependent on therapist support due  to RLE buckling                              Pertinent Vitals/Pain Pain Assessment Pain Assessment: No/denies pain    Home Living Family/patient expects to be discharged to:: Private residence Living Arrangements: Spouse/significant other;Parent Available Help at Discharge: Family;Available 24 hours/day Type of Home: House Home Access: Ramped entrance       Home Layout: One level Home Equipment: Wheelchair - manual [facility] is broken, cannot get another until 2026, hemiwalker)      Prior Function Prior Level of Function : Needs assist       Physical Assist : Mobility (physical);ADLs (physical) Mobility (physical): Bed mobility;Transfers ADLs (physical): Grooming;Bathing;Dressing;Toileting;IADLs Mobility Comments: wife assists with sit-stand and pivot transfers, unable to complete recently due to LLE weakness ADLs Comments: wife does all ADLs and IADLs, pt can feed himself     Extremity/Trunk Assessment   Upper Extremity Assessment Upper Extremity Assessment: RUE deficits/detail RUE Deficits / Details: chronic R hemi, clonus at wrist, finger flexion contracture RUE Sensation: decreased light touch;decreased proprioception RUE Coordination: decreased fine motor;decreased gross motor    Lower Extremity Assessment Lower Extremity Assessment: LLE deficits/detail;RLE deficits/detail RLE Deficits / Details: chronic hemi, sustained clonus at ankle with limite ROM. has AFO but does not use RLE Sensation: decreased light touch;decreased proprioception RLE Coordination: decreased fine motor;decreased gross motor LLE Deficits / Details: grossly 4/5 with reports sensation intact.    Cervical / Trunk Assessment Cervical / Trunk Assessment: Normal  Communication   Communication Communication: No apparent difficulties    Cognition Arousal: Alert Behavior During Therapy: Flat affect   PT - Cognitive impairments: Awareness, Initiation, Sequencing, Problem solving                       PT -  Cognition Comments: pt needing repeated cues and simple commands Following commands: Impaired Following commands impaired: Follows one step commands with increased time     Cueing Cueing Techniques: Verbal cues     General Comments General comments (skin integrity, edema, etc.): VSS on RA, HR to 130s with standing    Exercises     Assessment/Plan    PT Assessment Patient needs continued PT services  PT Problem List Decreased strength;Decreased activity tolerance;Decreased balance;Decreased mobility;Decreased coordination       PT Treatment Interventions DME instruction;Gait training;Stair training;Functional mobility training;Therapeutic activities;Therapeutic exercise;Balance training;Neuromuscular re-education;Patient/family education    PT Goals (Current goals can be found in the Care Plan section)  Acute Rehab PT Goals Patient Stated Goal: to return home, be able to transfer PT Goal Formulation: With patient/family Time For Goal Achievement: 10/04/24 Potential to Achieve Goals: Fair    Frequency Min 2X/week        AM-PAC PT 6 Clicks Mobility  Outcome Measure Help needed turning from your back to your side while in a flat bed without using bedrails?: A Lot Help needed moving from lying on your back to sitting on the side of a flat bed without using bedrails?: A Lot Help needed moving to and from a bed to a chair (including a wheelchair)?: A Lot Help needed standing up from a chair using your arms (e.g., wheelchair or bedside chair)?: A Lot Help needed to walk in hospital room?: Total Help needed climbing 3-5 steps with a railing? : Total 6 Click Score: 10    End of Session Equipment Utilized During Treatment: Gait belt Activity Tolerance: Patient  tolerated treatment well;Patient limited by fatigue Patient left: in bed;with call bell/phone within reach;with bed alarm set;with family/visitor present Nurse Communication: Mobility status;Need for lift equipment PT  Visit Diagnosis: Unsteadiness on feet (R26.81);Muscle weakness (generalized) (M62.81);Hemiplegia and hemiparesis Hemiplegia - Right/Left: Right Hemiplegia - dominant/non-dominant: Dominant    Time: 8394-8360 PT Time Calculation (min) (ACUTE ONLY): 34 min   Charges:   PT Evaluation $PT Eval Moderate Complexity: 1 Mod PT Treatments $Therapeutic Activity: 8-22 mins PT General Charges $$ ACUTE PT VISIT: 1 Visit         Izetta Call, PT, DPT   Acute Rehabilitation Department Office [phone] Secure Chat Communication Preferred  Izetta JULIANNA Call 09/20/2024, 5:39 PM

## 2024-09-20 NOTE — Progress Notes (Signed)
 PROGRESS NOTE        PATIENT DETAILS Name: Jason Romero Age: 53 y.o. Sex: male Date of Birth: [birth year] Admit Date: 09/18/2024 Admitting Physician Editha Ram, MD ERE:Gnywdnw, Barnie NOVAK, MD  Brief Summary: Patient is a 53 y.o.  male with history of prior ICH-spastic right-sided hemiplegia, chronic A-fib not on anticoagulation-presented with hematuria, cough/malaise-found to have sepsis secondary to complicated UTI due to 1.8 cm ureteropelvic junction calculus.  Significant events: 11/8>> admit to [facility]  Significant studies: 11/7>> chest x-ray: Retrocardiac atelectasis or pneumonia 11/7>> CT renal stone study: Right renal pelvis calculi 1.8 cm without hydronephrosis.  Significant microbiology data: 11/7>> COVID/influenza/RSV PCR: Negative 11/7>> blood culture: E. coli (prelim) 11/7>> urine culture:<10,000 colonies/mL  Procedures: 11/8>> cystoscopy-double-J right ureteral stent placement  Consults: None  Subjective: Lying comfortably in bed-denies any chest pain or shortness of breath.  Frustrated that he needs to remain in the hospital.  Objective: Vitals: Blood pressure (!) 128/90, pulse (!) 102, temperature 97.7 F (36.5 C), temperature source Oral, resp. rate (!) 22, height 6' 2 (1.88 m), weight 103 kg, SpO2 95%.   Exam: Gen Exam:Alert awake-not in any distress HEENT:atraumatic, normocephalic Chest: B/L clear to auscultation anteriorly CVS:S1S2 regular Abdomen:soft non tender, non distended Extremities:no edema Neurology: Right-sided hemiplegia Skin: no rash  Pertinent Labs/Radiology:    Latest Ref Rng & Units 09/19/2024    4:31 AM 09/18/2024    9:12 PM 07/06/2024    3:24 PM  CBC  WBC 4.0 - 10.5 K/uL 14.5  16.2  8.4   Hemoglobin 13.0 - 17.0 g/dL 88.6  88.2  87.3   Hematocrit 39.0 - 52.0 % 33.5  35.5  39.4   Platelets 150 - 400 K/uL 248  266  300     Lab Results  Component Value Date   NA 139 09/19/2024   K 3.5  09/19/2024   CL 105 09/19/2024   CO2 22 09/19/2024      Assessment/Plan: Sepsis (POA) secondary to complicated UTI in the setting of 1.8 cm right UPJ stone and gram-negative bacteremia Clinically improved-with right ureteral stent placement and IV antibiotics. White count downtrending Continue empiric Rocephin  Await final blood culture results-unfortunately urine culture results nondiagnostic. Doubt PNA (ruled out) stop Zithromax )  Hematuria secondary to right UPJ stone Hematuria seems to have resolved S/p cystoscopy and right ureteral stent placement on 11/8 Urology will arrange for outpatient ureteroscopy or PCNL in a couple of weeks.  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   Code status:   Code Status: Full Code   DVT Prophylaxis: SCDs Start: 09/19/24 0415   Family Communication: Spouse at bedside   Disposition Plan: Status is: Inpatient Remains inpatient appropriate because: Severity of illness   Planned Discharge Destination:Home health   Diet: Diet Order             Diet regular Room service appropriate? Yes; Fluid consistency: Thin  Diet effective now  Antimicrobial agents: Anti-infectives (From admission, onward)    Start     Dose/Rate Route Frequency Ordered Stop   09/19/24 2200  azithromycin  (ZITHROMAX ) 500 mg in sodium chloride  0.9 % 250 mL IVPB        500 mg 250 mL/hr over 60 Minutes Intravenous Every 24 hours 09/19/24 0416     09/19/24 2200  cefTRIAXone  (ROCEPHIN ) 1 g  in sodium chloride  0.9 % 100 mL IVPB        1 g 200 mL/hr over 30 Minutes Intravenous Every 24 hours 09/19/24 0416     09/18/24 2200  azithromycin  (ZITHROMAX ) 500 mg in sodium chloride  0.9 % 250 mL IVPB        500 mg 250 mL/hr over 60 Minutes Intravenous  Once 09/18/24 2153 09/19/24 0230   09/18/24 2100  cefTRIAXone  (ROCEPHIN ) 1 g in sodium chloride  0.9 % 100 mL IVPB        1 g 200 mL/hr over 30 Minutes Intravenous  Once 09/18/24 2046 09/18/24 2239        MEDICATIONS: Scheduled Meds:  atorvastatin   20 mg Oral Daily   baclofen   20 mg Oral TID   influenza vac split trivalent PF  0.5 mL Intramuscular Tomorrow-1000   labetalol   100 mg Oral BID   pregabalin   150 mg Oral BID   Continuous Infusions:  azithromycin  (ZITHROMAX ) 500 mg in sodium chloride  0.9 % 250 mL IVPB Stopped (09/19/24 2340)   cefTRIAXone  (ROCEPHIN )  IV Stopped (09/19/24 2221)   PRN Meds:.acetaminophen  **OR** acetaminophen , albuterol , fentaNYL (SUBLIMAZE) injection, naLOXone (NARCAN)  injection, traZODone    I have personally reviewed following labs and imaging studies  LABORATORY DATA: CBC: Recent Labs  Lab 09/18/24 2112 09/19/24 0431  WBC 16.2* 14.5*  HGB 11.7* 11.3*  HCT 35.5* 33.5*  MCV 82.4 81.1  PLT 266 248    Basic Metabolic Panel: Recent Labs  Lab 09/18/24 2112 09/19/24 0431  NA 137 139  K 3.0* 3.5  CL 104 105  CO2 20* 22  GLUCOSE 111* 100*  BUN 10 9  CREATININE 1.05 0.86  CALCIUM  8.4* 8.3*  MG  --  1.7    GFR: Estimated Creatinine Clearance: 125.7 mL/min (by C-G formula based on SCr of 0.86 mg/dL).  Liver Function Tests: Recent Labs  Lab 09/18/24 2112  AST 12*  ALT 11  ALKPHOS 64  BILITOT 0.9  PROT 6.6  ALBUMIN 3.1*   No results for input(s): LIPASE, AMYLASE in the last 168 hours. No results for input(s): AMMONIA in the last 168 hours.  Coagulation Profile: No results for input(s): INR, PROTIME in the last 168 hours.  Cardiac Enzymes: No results for input(s):  CKTOTAL, CKMB, CKMBINDEX, TROPONINI in the last 168 hours.  BNP (last 3 results) No results for input(s): PROBNP in the last 8760 hours.  Lipid Profile: No results for input(s): CHOL, HDL, LDLCALC, TRIG, CHOLHDL, LDLDIRECT in the last 72 hours.  Thyroid  Function Tests: No results for input(s): TSH, T4TOTAL, FREET4, T3FREE, THYROIDAB in the last 72 hours.  Anemia Panel: No results for input(s): VITAMINB12, FOLATE, FERRITIN, TIBC, IRON, RETICCTPCT in the last 72 hours.  Urine analysis:    Component Value Date/Time   COLORURINE YELLOW 09/18/2024 2025   APPEARANCEUR CLOUDY (A) 09/18/2024 2025   LABSPEC 1.009 09/18/2024 2025   PHURINE 6.0 09/18/2024 2025   GLUCOSEU NEGATIVE 09/18/2024 2025   HGBUR MODERATE (A) 09/18/2024 2025   BILIRUBINUR NEGATIVE 09/18/2024 2025   BILIRUBINUR small 03/22/2016 1331   KETONESUR NEGATIVE 09/18/2024 2025  PROTEINUR 100 (A) 09/18/2024 2025   UROBILINOGEN 0.2 03/22/2016 1331   NITRITE NEGATIVE 09/18/2024 2025   LEUKOCYTESUR LARGE (A) 09/18/2024 2025    Sepsis Labs: Lactic Acid, Venous    Component Value Date/Time   LATICACIDVEN 0.9 09/18/2024 2123    MICROBIOLOGY: Recent Results (from the past 240 hours)  Urine Culture     Status: Abnormal   Collection Time: 09/18/24  8:26 PM   Specimen: Urine, Clean Catch  Result Value Ref Range Status   Specimen Description URINE, CLEAN CATCH  Final   Special Requests NONE  Final   Culture (A)  Final    <10,000 COLONIES/mL INSIGNIFICANT GROWTH Performed at [facility], [address], [place], [place] [address]    Report Status 09/19/2024 FINAL  Final  Resp panel by RT-PCR (RSV, Flu A&B, Covid) Anterior Nasal Swab     Status: None   Collection Time: 09/18/24  9:12 PM   Specimen: Anterior Nasal Swab  Result Value Ref Range Status   SARS Coronavirus 2 by RT PCR NEGATIVE NEGATIVE Final   Influenza A by PCR NEGATIVE NEGATIVE Final   Influenza B by PCR  NEGATIVE NEGATIVE Final    Comment: (NOTE) The Xpert Xpress SARS-CoV-2/FLU/RSV plus assay is intended as an aid in the diagnosis of influenza from Nasopharyngeal swab specimens and should not be used as a sole basis for treatment. Nasal washings and aspirates are unacceptable for Xpert Xpress SARS-CoV-2/FLU/RSV testing.  Fact Sheet for Patients: bloggercourse.com  Fact Sheet for Healthcare Providers: seriousbroker.it  This test is not yet approved or cleared by the [place]  FDA and has been authorized for detection and/or diagnosis of SARS-CoV-2 by FDA under an Emergency Use Authorization (EUA). This EUA will remain in effect (meaning this test can be used) for the duration of the COVID-19 declaration under Section 564(b)(1) of the Act, 21 U.S.C. section 360bbb-3(b)(1), unless the authorization is terminated or revoked.     Resp Syncytial Virus by PCR NEGATIVE NEGATIVE Final    Comment: (NOTE) Fact Sheet for Patients: bloggercourse.com  Fact Sheet for Healthcare Providers: seriousbroker.it  This test is not yet approved or cleared by the [place]  FDA and has been authorized for detection and/or diagnosis of SARS-CoV-2 by FDA under an Emergency Use Authorization (EUA). This EUA will remain in effect (meaning this test can be used) for the duration of the COVID-19 declaration under Section 564(b)(1) of the Act, 21 U.S.C. section 360bbb-3(b)(1), unless the authorization is terminated or revoked.  Performed at [facility], [address] [place], [place] [address]   Blood culture (routine x 2)     Status: None (Preliminary result)   Collection Time: 09/18/24  9:12 PM   Specimen: BLOOD LEFT ARM  Result Value Ref Range Status   Specimen Description BLOOD LEFT ARM  Final   Special Requests   Final    BOTTLES DRAWN AEROBIC AND ANAEROBIC Blood Culture adequate  volume   Culture  Setup Time   Final    GRAM NEGATIVE RODS ANAEROBIC BOTTLE ONLY CRITICAL RESULT CALLED TO, READ BACK BY AND VERIFIED WITH: PHARMD JADE D. [id number] AT 1310, ADC Performed at [facility], [address] [place], [place] [address]    Culture GRAM NEGATIVE RODS  Final   Report Status PENDING  Incomplete  Blood culture (routine x 2)     Status: None (Preliminary result)   Collection Time: 09/18/24  9:12 PM   Specimen: BLOOD RIGHT ARM  Result  Value Ref Range Status   Specimen Description BLOOD RIGHT ARM  Final   Special Requests   Final    BOTTLES DRAWN AEROBIC AND ANAEROBIC Blood Culture adequate volume   Culture   Final    NO GROWTH 2 DAYS Performed at [facility], [address], [place], [place] [address]    Report Status PENDING  Incomplete  Blood Culture ID Panel (Reflexed)     Status: Abnormal   Collection Time: 09/18/24  9:12 PM  Result Value Ref Range Status   Enterococcus faecalis NOT DETECTED NOT DETECTED Final   Enterococcus Faecium NOT DETECTED NOT DETECTED Final   Listeria monocytogenes NOT DETECTED NOT DETECTED Final   Staphylococcus species NOT DETECTED NOT DETECTED Final   Staphylococcus aureus (BCID) NOT DETECTED NOT DETECTED Final   Staphylococcus epidermidis NOT DETECTED NOT DETECTED Final   Staphylococcus lugdunensis NOT DETECTED NOT DETECTED Final   Streptococcus species NOT DETECTED NOT DETECTED Final   Streptococcus agalactiae NOT DETECTED NOT DETECTED Final   Streptococcus pneumoniae NOT DETECTED NOT DETECTED Final   Streptococcus pyogenes NOT DETECTED NOT DETECTED Final   A.calcoaceticus-baumannii NOT DETECTED NOT DETECTED Final   Bacteroides fragilis NOT DETECTED NOT DETECTED Final   Enterobacterales DETECTED (A) NOT DETECTED Final    Comment: Enterobacterales represent a large order of gram negative bacteria, not a single organism. CRITICAL RESULT CALLED TO, READ BACK BY AND VERIFIED WITH: PHARMD JADE D. [id number] AT 1310, ADC     Enterobacter cloacae complex NOT DETECTED NOT DETECTED Final   Escherichia coli DETECTED (A) NOT DETECTED Final    Comment: CRITICAL RESULT CALLED TO, READ BACK BY AND VERIFIED WITH: PHARMD JADE D. [id number] AT 1310, ADC    Klebsiella aerogenes NOT DETECTED NOT DETECTED Final   Klebsiella oxytoca NOT DETECTED NOT DETECTED Final   Klebsiella pneumoniae NOT DETECTED NOT DETECTED Final   Proteus species NOT DETECTED NOT DETECTED Final   Salmonella species NOT DETECTED NOT DETECTED Final   Serratia marcescens NOT DETECTED NOT DETECTED Final   Haemophilus influenzae NOT DETECTED NOT DETECTED Final   Neisseria meningitidis NOT DETECTED NOT DETECTED Final   Pseudomonas aeruginosa NOT DETECTED NOT DETECTED Final   Stenotrophomonas maltophilia NOT DETECTED NOT DETECTED Final   Candida albicans NOT DETECTED NOT DETECTED Final   Candida auris NOT DETECTED NOT DETECTED Final   Candida glabrata NOT DETECTED NOT DETECTED Final   Candida krusei NOT DETECTED NOT DETECTED Final   Candida parapsilosis NOT DETECTED NOT DETECTED Final   Candida tropicalis NOT DETECTED NOT DETECTED Final   Cryptococcus neoformans/gattii NOT DETECTED NOT DETECTED Final   CTX-M ESBL NOT DETECTED NOT DETECTED Final   Carbapenem resistance IMP NOT DETECTED NOT DETECTED Final   Carbapenem resistance KPC NOT DETECTED NOT DETECTED Final   Carbapenem resistance NDM NOT DETECTED NOT DETECTED Final   Carbapenem resist OXA 48 LIKE NOT DETECTED NOT DETECTED Final   Carbapenem resistance VIM NOT DETECTED NOT DETECTED Final    Comment: Performed at [facility], [address], [place], [place] [address]    RADIOLOGY STUDIES/RESULTS: DG C-Arm 1-60 Min-No Report Result Date: 09/19/2024 Fluoroscopy was utilized by the requesting physician.  No radiographic interpretation.   CT Renal Stone Study Result Date: 09/18/2024 EXAM: CT UROGRAM 09/18/2024 11:43:03 PM TECHNIQUE: CT of the abdomen and pelvis was performed without the  administration of intravenous contrast as per CT urogram protocol. Multiplanar reformatted images as well as MIP urogram images are provided for review. Automated exposure  control, iterative reconstruction, and/or weight based adjustment of the mA/kV was utilized to reduce the radiation dose to as low as reasonably achievable. COMPARISON: None available. CLINICAL HISTORY: Abdominal/flank pain, stone suspected. FINDINGS: LOWER CHEST: There is patchy atelectasis and airspace disease in the right lower lobe. LIVER: The liver is unremarkable. GALLBLADDER AND BILE DUCTS: The gallbladder is surgically absent. No biliary ductal dilatation. SPLEEN: No acute abnormality. PANCREAS: No acute abnormality. ADRENAL GLANDS: No acute abnormality. KIDNEYS, URETERS AND BLADDER: There is a right renal pelvis calculus measuring 1.8 cm without hydronephrosis. No perinephric or periureteral stranding. Mild diffuse bladder wall thickening versus normal under distention. GI AND BOWEL: Stomach demonstrates no acute abnormality. There is no bowel obstruction. Appendix appears normal. PERITONEUM AND RETROPERITONEUM: No ascites. No free air. VASCULATURE: Aorta is normal in caliber. There are atherosclerotic calcifications of the aorta. LYMPH NODES: Numerous nonenlarged retroperitoneal and iliac chain lymph nodes. REPRODUCTIVE ORGANS: No acute abnormality. BONES AND SOFT TISSUES: No acute osseous abnormality. Mild bilateral neck periarticular fat stranding which is nonspecific. Small fat-containing bilateral inguinal hernias. IMPRESSION: 1. Right renal pelvis calculus measuring 1.8 cm without hydronephrosis. 2. Mild diffuse bladder wall thickening versus normal underdistention. Correlate clinically for cystitis. 3. Patchy right lower lobe atelectasis/airspace disease. 4. Status post cholecystectomy. 5. Numerous nonenlarged retroperitoneal and iliac chain lymph nodes, likely reactive. Electronically signed by: Greig Pique MD 09/18/2024 11:49 PM  EST RP Workstation: [id number]   DG Chest 2 View Result Date: 09/18/2024 EXAM: 2 VIEW(S) XRAY OF THE CHEST 09/18/2024 08:58:13 PM COMPARISON: 5 / 4 / 23 CLINICAL HISTORY: cough, fever, malaise FINDINGS: LUNGS AND PLEURA: Low lung volumes. Retrocardiac atelectasis or pneumonia. No pulmonary edema. No pleural effusion. No pneumothorax. HEART AND MEDIASTINUM: Mild cardiomegaly. No acute abnormality of the mediastinal silhouette. BONES AND SOFT TISSUES: No acute osseous abnormality. IMPRESSION: 1. Hypoventilation with retrocardiac atelectasis or pneumonia. Electronically signed by: Norman Gatlin MD 09/18/2024 09:15 PM EST RP Workstation: [id number]     LOS: 1 day   Donalda Applebaum, MD  [facility]    To contact the attending provider between 7A-7P or the covering provider during after hours 7P-7A, please log into the web site [email or web address] and access using universal [place] password for that web site. If you do not have the password, please call the hospital operator.  09/20/2024, 8:44 AM

## 2024-09-20 NOTE — Progress Notes (Signed)
 Urology Inpatient Progress Report  Hypokalemia [E87.6] CAP (community acquired pneumonia) [J18.9] Acute cystitis with hematuria [N30.01] Pneumonia of left lower lobe due to infectious organism [J18.9]  Procedure(s): CYSTOSCOPY, WITH STENT INSERTION  1 Day Post-Op   Intv/Subj: No acute events overnight. Patient is without complaint.  Doing much better.  No complaints of pain or discomfort.  Blood culture positive for E. coli.  Sensitivities pending.  Principal Problem:   CAP (community acquired pneumonia) Active Problems:   Essential hypertension   UTI (urinary tract infection)   Hematuria   Hypokalemia  Current Facility-Administered Medications  Medication Dose Route Frequency Provider Last Rate Last Admin   acetaminophen  (TYLENOL ) tablet 650 mg  650 mg Oral Q6H PRN Rathore, Vasundhra, MD   650 mg at 09/20/24 0357   Or   acetaminophen  (TYLENOL ) suppository 650 mg  650 mg Rectal Q6H PRN Alfornia Madison, MD       albuterol  (PROVENTIL ) (2.5 MG/3ML) 0.083% nebulizer solution 3 mL  3 mL Inhalation Q6H PRN Alfornia Madison, MD       atorvastatin  (LIPITOR) tablet 20 mg  20 mg Oral Daily Ghimire, Shanker M, MD   20 mg at 09/20/24 1039   azithromycin  (ZITHROMAX ) 500 mg in sodium chloride  0.9 % 250 mL IVPB  500 mg Intravenous Q24H Rathore, Vasundhra, MD   Stopped at 09/19/24 2340   baclofen  (LIORESAL ) tablet 20 mg  20 mg Oral TID Ghimire, Shanker M, MD   20 mg at 09/20/24 1346   cefTRIAXone  (ROCEPHIN ) 2 g in sodium chloride  0.9 % 100 mL IVPB  2 g Intravenous Q24H Ghimire, Shanker M, MD       enoxaparin (LOVENOX) injection 40 mg  40 mg Subcutaneous Q24H Ghimire, Shanker M, MD   40 mg at 09/20/24 1039   fentaNYL (SUBLIMAZE) injection 12.5 mcg  12.5 mcg Intravenous Q2H PRN Rathore, Vasundhra, MD   12.5 mcg at 09/19/24 1645   influenza vac split trivalent PF (FLUZONE) injection 0.5 mL  0.5 mL Intramuscular Tomorrow-1000 Odell Celinda Balo, MD       labetalol  (NORMODYNE ) tablet 100 mg   100 mg Oral BID Raenelle Donalda HERO, MD   100 mg at 09/20/24 1039   naloxone (NARCAN) injection 0.4 mg  0.4 mg Intravenous PRN Rathore, Vasundhra, MD       pregabalin  (LYRICA ) capsule 150 mg  150 mg Oral BID Raenelle Donalda HERO, MD   150 mg at 09/20/24 1039   traZODone  (DESYREL ) tablet 150 mg  150 mg Oral QHS PRN Raenelle Donalda HERO, MD         Objective: Vital: Vitals:   09/20/24 0027 09/20/24 0322 09/20/24 0500 09/20/24 0753  BP: 112/79 127/85  (!) 128/90  Pulse: 97 97  (!) 102  Resp: 20 20  (!) 22  Temp: 98.4 F (36.9 C) 97.6 F (36.4 C)  97.7 F (36.5 C)  TempSrc: Oral Oral  Oral  SpO2: 98% 98% 98% 95%  Weight:      Height:       I/Os: I/O last 3 completed shifts: In: 3229.1 [I.V.:1479.1; IV Piggyback:1750] Out: 550 [Urine:550]  Physical Exam:  General: Patient is in no apparent distress Lungs: Normal respiratory effort, chest expands symmetrically. GI: The abdomen is soft and nontender without mass. Ext: lower extremities symmetric  Lab Results: Recent Labs    09/18/24 2112 09/19/24 0431  WBC 16.2* 14.5*  HGB 11.7* 11.3*  HCT 35.5* 33.5*   Recent Labs    09/18/24 2112 09/19/24 0431  NA 137  139  K 3.0* 3.5  CL 104 105  CO2 20* 22  GLUCOSE 111* 100*  BUN 10 9  CREATININE 1.05 0.86  CALCIUM  8.4* 8.3*   No results for input(s): LABPT, INR in the last 72 hours. No results for input(s): LABURIN in the last 72 hours. Results for orders placed or performed during the hospital encounter of 09/18/24  Urine Culture     Status: Abnormal   Collection Time: 09/18/24  8:26 PM   Specimen: Urine, Clean Catch  Result Value Ref Range Status   Specimen Description URINE, CLEAN CATCH  Final   Special Requests NONE  Final   Culture (A)  Final    <10,000 COLONIES/mL INSIGNIFICANT GROWTH Performed at [facility], [address], [place], [place] [address]    Report Status 09/19/2024 FINAL  Final  Resp panel by RT-PCR (RSV, Flu A&B, Covid) Anterior Nasal  Swab     Status: None   Collection Time: 09/18/24  9:12 PM   Specimen: Anterior Nasal Swab  Result Value Ref Range Status   SARS Coronavirus 2 by RT PCR NEGATIVE NEGATIVE Final   Influenza A by PCR NEGATIVE NEGATIVE Final   Influenza B by PCR NEGATIVE NEGATIVE Final    Comment: (NOTE) The Xpert Xpress SARS-CoV-2/FLU/RSV plus assay is intended as an aid in the diagnosis of influenza from Nasopharyngeal swab specimens and should not be used as a sole basis for treatment. Nasal washings and aspirates are unacceptable for Xpert Xpress SARS-CoV-2/FLU/RSV testing.  Fact Sheet for Patients: bloggercourse.com  Fact Sheet for Healthcare Providers: seriousbroker.it  This test is not yet approved or cleared by the [place]  FDA and has been authorized for detection and/or diagnosis of SARS-CoV-2 by FDA under an Emergency Use Authorization (EUA). This EUA will remain in effect (meaning this test can be used) for the duration of the COVID-19 declaration under Section 564(b)(1) of the Act, 21 U.S.C. section 360bbb-3(b)(1), unless the authorization is terminated or revoked.     Resp Syncytial Virus by PCR NEGATIVE NEGATIVE Final    Comment: (NOTE) Fact Sheet for Patients: bloggercourse.com  Fact Sheet for Healthcare Providers: seriousbroker.it  This test is not yet approved or cleared by the [place]  FDA and has been authorized for detection and/or diagnosis of SARS-CoV-2 by FDA under an Emergency Use Authorization (EUA). This EUA will remain in effect (meaning this test can be used) for the duration of the COVID-19 declaration under Section 564(b)(1) of the Act, 21 U.S.C. section 360bbb-3(b)(1), unless the authorization is terminated or revoked.  Performed at [facility], [address], [place], [place] [address]   Blood culture (routine x 2)     Status: Abnormal  (Preliminary result)   Collection Time: 09/18/24  9:12 PM   Specimen: BLOOD LEFT ARM  Result Value Ref Range Status   Specimen Description BLOOD LEFT ARM  Final   Special Requests   Final    BOTTLES DRAWN AEROBIC AND ANAEROBIC Blood Culture adequate volume   Culture  Setup Time   Final    GRAM NEGATIVE RODS ANAEROBIC BOTTLE ONLY CRITICAL RESULT CALLED TO, READ BACK BY AND VERIFIED WITH: PHARMD JADE D. [id number] AT 1310, ADC    Culture (A)  Final    ESCHERICHIA COLI SUSCEPTIBILITIES TO FOLLOW Performed at [facility], [address], [place], [place] [address]    Report Status PENDING  Incomplete  Blood culture (routine x 2)     Status: None (Preliminary result)  Collection Time: 09/18/24  9:12 PM   Specimen: BLOOD RIGHT ARM  Result Value Ref Range Status   Specimen Description BLOOD RIGHT ARM  Final   Special Requests   Final    BOTTLES DRAWN AEROBIC AND ANAEROBIC Blood Culture adequate volume   Culture   Final    NO GROWTH 2 DAYS Performed at [facility], [address] [place], [place] [address]    Report Status PENDING  Incomplete  Blood Culture ID Panel (Reflexed)     Status: Abnormal   Collection Time: 09/18/24  9:12 PM  Result Value Ref Range Status   Enterococcus faecalis NOT DETECTED NOT DETECTED Final   Enterococcus Faecium NOT DETECTED NOT DETECTED Final   Listeria monocytogenes NOT DETECTED NOT DETECTED Final   Staphylococcus species NOT DETECTED NOT DETECTED Final   Staphylococcus aureus (BCID) NOT DETECTED NOT DETECTED Final   Staphylococcus epidermidis NOT DETECTED NOT DETECTED Final   Staphylococcus lugdunensis NOT DETECTED NOT DETECTED Final   Streptococcus species NOT DETECTED NOT DETECTED Final   Streptococcus agalactiae NOT DETECTED NOT DETECTED Final   Streptococcus pneumoniae NOT DETECTED NOT DETECTED Final   Streptococcus pyogenes NOT DETECTED NOT DETECTED Final   A.calcoaceticus-baumannii NOT DETECTED NOT DETECTED Final   Bacteroides  fragilis NOT DETECTED NOT DETECTED Final   Enterobacterales DETECTED (A) NOT DETECTED Final    Comment: Enterobacterales represent a large order of gram negative bacteria, not a single organism. CRITICAL RESULT CALLED TO, READ BACK BY AND VERIFIED WITH: PHARMD JADE D. [id number] AT 1310, ADC    Enterobacter cloacae complex NOT DETECTED NOT DETECTED Final   Escherichia coli DETECTED (A) NOT DETECTED Final    Comment: CRITICAL RESULT CALLED TO, READ BACK BY AND VERIFIED WITH: PHARMD JADE D. [id number] AT 1310, ADC    Klebsiella aerogenes NOT DETECTED NOT DETECTED Final   Klebsiella oxytoca NOT DETECTED NOT DETECTED Final   Klebsiella pneumoniae NOT DETECTED NOT DETECTED Final   Proteus species NOT DETECTED NOT DETECTED Final   Salmonella species NOT DETECTED NOT DETECTED Final   Serratia marcescens NOT DETECTED NOT DETECTED Final   Haemophilus influenzae NOT DETECTED NOT DETECTED Final   Neisseria meningitidis NOT DETECTED NOT DETECTED Final   Pseudomonas aeruginosa NOT DETECTED NOT DETECTED Final   Stenotrophomonas maltophilia NOT DETECTED NOT DETECTED Final   Candida albicans NOT DETECTED NOT DETECTED Final   Candida auris NOT DETECTED NOT DETECTED Final   Candida glabrata NOT DETECTED NOT DETECTED Final   Candida krusei NOT DETECTED NOT DETECTED Final   Candida parapsilosis NOT DETECTED NOT DETECTED Final   Candida tropicalis NOT DETECTED NOT DETECTED Final   Cryptococcus neoformans/gattii NOT DETECTED NOT DETECTED Final   CTX-M ESBL NOT DETECTED NOT DETECTED Final   Carbapenem resistance IMP NOT DETECTED NOT DETECTED Final   Carbapenem resistance KPC NOT DETECTED NOT DETECTED Final   Carbapenem resistance NDM NOT DETECTED NOT DETECTED Final   Carbapenem resist OXA 48 LIKE NOT DETECTED NOT DETECTED Final   Carbapenem resistance VIM NOT DETECTED NOT DETECTED Final    Comment: Performed at [facility], [address] [place], [place] [address]    Studies/Results: DG C-Arm 1-60  Min-No Report Result Date: 09/19/2024 Fluoroscopy was utilized by the requesting physician.  No radiographic interpretation.   CT Renal Stone Study Result Date: 09/18/2024 EXAM: CT UROGRAM 09/18/2024 11:43:03 PM TECHNIQUE: CT of the abdomen and pelvis was performed without the administration of intravenous contrast as per CT urogram protocol. Multiplanar reformatted  images as well as MIP urogram images are provided for review. Automated exposure control, iterative reconstruction, and/or weight based adjustment of the mA/kV was utilized to reduce the radiation dose to as low as reasonably achievable. COMPARISON: None available. CLINICAL HISTORY: Abdominal/flank pain, stone suspected. FINDINGS: LOWER CHEST: There is patchy atelectasis and airspace disease in the right lower lobe. LIVER: The liver is unremarkable. GALLBLADDER AND BILE DUCTS: The gallbladder is surgically absent. No biliary ductal dilatation. SPLEEN: No acute abnormality. PANCREAS: No acute abnormality. ADRENAL GLANDS: No acute abnormality. KIDNEYS, URETERS AND BLADDER: There is a right renal pelvis calculus measuring 1.8 cm without hydronephrosis. No perinephric or periureteral stranding. Mild diffuse bladder wall thickening versus normal under distention. GI AND BOWEL: Stomach demonstrates no acute abnormality. There is no bowel obstruction. Appendix appears normal. PERITONEUM AND RETROPERITONEUM: No ascites. No free air. VASCULATURE: Aorta is normal in caliber. There are atherosclerotic calcifications of the aorta. LYMPH NODES: Numerous nonenlarged retroperitoneal and iliac chain lymph nodes. REPRODUCTIVE ORGANS: No acute abnormality. BONES AND SOFT TISSUES: No acute osseous abnormality. Mild bilateral neck periarticular fat stranding which is nonspecific. Small fat-containing bilateral inguinal hernias. IMPRESSION: 1. Right renal pelvis calculus measuring 1.8 cm without hydronephrosis. 2. Mild diffuse bladder wall thickening versus normal  underdistention. Correlate clinically for cystitis. 3. Patchy right lower lobe atelectasis/airspace disease. 4. Status post cholecystectomy. 5. Numerous nonenlarged retroperitoneal and iliac chain lymph nodes, likely reactive. Electronically signed by: Greig Pique MD 09/18/2024 11:49 PM EST RP Workstation: [id number]   DG Chest 2 View Result Date: 09/18/2024 EXAM: 2 VIEW(S) XRAY OF THE CHEST 09/18/2024 08:58:13 PM COMPARISON: 5 / 4 / 23 CLINICAL HISTORY: cough, fever, malaise FINDINGS: LUNGS AND PLEURA: Low lung volumes. Retrocardiac atelectasis or pneumonia. No pulmonary edema. No pleural effusion. No pneumothorax. HEART AND MEDIASTINUM: Mild cardiomegaly. No acute abnormality of the mediastinal silhouette. BONES AND SOFT TISSUES: No acute osseous abnormality. IMPRESSION: 1. Hypoventilation with retrocardiac atelectasis or pneumonia. Electronically signed by: Norman Gatlin MD 09/18/2024 09:15 PM EST RP Workstation: [id number]    Assessment: Right ureteropelvic junction calculus Sepsis secondary to UTI  Procedure(s): CYSTOSCOPY, WITH STENT INSERTION, 1 Day Post-Op  doing well.  Plan: Continue IV antibiotics until susceptibilities return for the E. coli.  He will then need 2 weeks of oral antibiotic.  We will get him scheduled for ureteroscopy in a couple weeks or so.   Sherwood Edison, MD Urology 09/20/2024, 3:39 PM

## 2024-09-21 ENCOUNTER — Encounter (HOSPITAL_COMMUNITY): Payer: Self-pay | Admitting: Urology

## 2024-09-21 DIAGNOSIS — I10 Essential (primary) hypertension: Secondary | ICD-10-CM | POA: Diagnosis not present

## 2024-09-21 DIAGNOSIS — E876 Hypokalemia: Secondary | ICD-10-CM | POA: Diagnosis not present

## 2024-09-21 DIAGNOSIS — N3001 Acute cystitis with hematuria: Secondary | ICD-10-CM | POA: Diagnosis not present

## 2024-09-21 DIAGNOSIS — N200 Calculus of kidney: Secondary | ICD-10-CM | POA: Diagnosis not present

## 2024-09-21 LAB — CULTURE, BLOOD (ROUTINE X 2): Special Requests: ADEQUATE

## 2024-09-21 LAB — CBC
HCT: 34.5 % — ABNORMAL LOW (ref 39.0–52.0)
Hemoglobin: 11.6 g/dL — ABNORMAL LOW (ref 13.0–17.0)
MCH: 27.2 pg (ref 26.0–34.0)
MCHC: 33.6 g/dL (ref 30.0–36.0)
MCV: 81 fL (ref 80.0–100.0)
Platelets: 316 K/uL (ref 150–400)
RBC: 4.26 MIL/uL (ref 4.22–5.81)
RDW: 15.3 % (ref 11.5–15.5)
WBC: 7.9 K/uL (ref 4.0–10.5)
nRBC: 0 % (ref 0.0–0.2)

## 2024-09-21 LAB — BASIC METABOLIC PANEL WITH GFR
Anion gap: 10 (ref 5–15)
BUN: 13 mg/dL (ref 6–20)
CO2: 23 mmol/L (ref 22–32)
Calcium: 8.3 mg/dL — ABNORMAL LOW (ref 8.9–10.3)
Chloride: 107 mmol/L (ref 98–111)
Creatinine, Ser: 0.84 mg/dL (ref 0.61–1.24)
GFR, Estimated: >60 mL/min (ref >60)
Glucose, Bld: 89 mg/dL (ref 70–99)
Potassium: 3.4 mmol/L — ABNORMAL LOW (ref 3.5–5.1)
Sodium: 140 mmol/L (ref 135–145)

## 2024-09-21 LAB — MAGNESIUM: Magnesium: 2.1 mg/dL (ref 1.7–2.4)

## 2024-09-21 MED ORDER — CIPROFLOXACIN HCL 500 MG PO TABS
500.0000 mg | ORAL_TABLET | Freq: Two times a day (BID) | ORAL | Status: DC
  Administered 2024-09-21 – 2024-09-22 (×2): 500 mg via ORAL
  Filled 2024-09-21 (×2): qty 1

## 2024-09-21 MED ORDER — POTASSIUM CHLORIDE CRYS ER 20 MEQ PO TBCR
40.0000 meq | EXTENDED_RELEASE_TABLET | Freq: Once | ORAL | Status: AC
  Administered 2024-09-21: 40 meq via ORAL
  Filled 2024-09-21: qty 2

## 2024-09-21 NOTE — Evaluation (Addendum)
 Clinical/Bedside Swallow Evaluation Patient Details  Name: Jason Romero MRN: [id number] Date of Birth: November 02, 1971  Today's Date: 09/21/2024 Time: SLP Start Time (ACUTE ONLY): 0841 SLP Stop Time (ACUTE ONLY): 0850 SLP Time Calculation (min) (ACUTE ONLY): 9 min  Past Medical History:  Past Medical History:  Diagnosis Date   A-fib (HCC)    Bilateral impacted cerumen 05/08/2022   Chronic headaches    COPD (chronic obstructive pulmonary disease) (HCC)    GERD (gastroesophageal reflux disease)    History of blood transfusion    Hypertension    MI (myocardial infarction) (HCC)    Murmur 02/12/2021   Seizures (HCC)    Stroke [facility])    Past Surgical History:  Past Surgical History:  Procedure Laterality Date   CHOLECYSTECTOMY     ELBOW SURGERY Left    HPI:  The pt is a 53 yo male presenting 11/7 with blood in urine. S/p cystoscopy with stent placement 11/8. CXR hypoventilation with retrocardiac atelectasis or pneumonia. PMH includes: HTN, HLD, CVA with R-sided hemiplegia, dysphagia (thick liquids 6 yrs ago- no documentation found),  migranes, GERD, hep C, COPD, OSA, afib not on anticoagulation, seizures, and anemia.    Assessment / Plan / Recommendation  Clinical Impression  Recommend continue regular texture, thin liquids with small sips, upright position and pills with thin. No further ST needed.   Pt has a history of dysphagia with thickened liquids 6 years ago (for short period per wife) and has been consuming thin and regular since that time. He/wife states sometimes he coughs if takes large sips but infrequent. 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 Has one upper tooth tooth and lower teeth are absent- flush with gums and poor condition. He coughed once prior to po's and once following larger, consecutive sips thin via straw eliminated with smaller sips for remainder of assessment and pt able to implement strategy. Functional mastication, timely with complete  clearance. Pt may have occasional instances of airway intrusion however when using strategies of smaller sips this increases safety. Recommend continue regular texutre, thin liquids, small sips and pills with thin (pt/wife deny difficulty). No further ST needed. Educated if pt was to be admitted in near future with pna an instrumental assessment may be warranted. SLP Visit Diagnosis: Dysphagia, unspecified (R13.10)    Aspiration Risk  Mild aspiration risk    Diet Recommendation Regular;Thin liquid    Liquid Administration via: Cup;Straw Medication Administration: Whole meds with liquid Supervision: Staff to assist with self feeding (may need help due to RUE paresis) Compensations: Slow rate;Small sips/bites Postural Changes: Seated upright at 90 degrees    Other  Recommendations Oral Care Recommendations: Oral care BID     Assistance Recommended at Discharge    Functional Status Assessment Patient has not had a recent decline in their functional status  Frequency and Duration            Prognosis        Swallow Study   General HPI: The pt is a 53 yo male presenting 11/7 with blood in urine. S/p cystoscopy with stent placement 11/8. CXR hypoventilation with retrocardiac atelectasis or pneumonia. PMH includes: HTN, HLD, CVA with R-sided hemiplegia, dysphagia (thick liquids 6 yrs ago- no documentation found),  migranes, GERD, hep C, COPD, OSA, afib not on anticoagulation, seizures, and anemia. Type of Study: Bedside Swallow Evaluation Previous Swallow Assessment:  (none) Diet Prior to this Study: Regular;Thin liquids (Level 0) Temperature Spikes Noted: No Respiratory Status: Room air  History of Recent Intubation: No Behavior/Cognition: Alert;Cooperative;Pleasant mood Oral Cavity Assessment: Within Functional Limits Oral Care Completed by SLP: No Oral Cavity - Dentition: Poor condition;Missing dentition;Other (Comment) (only has 1 upper tooth, lower are decaying) Vision:  Functional for self-feeding Self-Feeding Abilities: Able to feed self Patient Positioning:  (edge of bed) Baseline Vocal Quality: Normal Volitional Cough: Strong Volitional Swallow: Able to elicit    Oral/Motor/Sensory Function Overall Oral Motor/Sensory Function: Mild impairment Facial ROM: Reduced right;Suspected CN VII (facial) dysfunction (from prior stroke) Facial Symmetry: Abnormal symmetry right;Suspected CN VII (facial) dysfunction   Ice Chips Ice chips: Not tested   Thin Liquid Thin Liquid: Impaired Presentation: Straw Pharyngeal  Phase Impairments: Cough - Immediate (after large consecutive sips)    Nectar Thick Nectar Thick Liquid: Not tested   Honey Thick Honey Thick Liquid: Not tested   Puree Puree: Not tested   Solid     Solid: Within functional limits      Dustin Olam Bull 09/21/2024,9:40 AM

## 2024-09-21 NOTE — Progress Notes (Signed)
 Orthopedic Tech Progress Note Patient Details:  Jason Romero 22-Jun-1971 [id number] Resting hand splints delivered to room from [facility] Patient ID: Jason Romero, male   DOB: [birth year], 53 y.o.   MRN: [id number]  Jason Romero Cos 09/21/2024, 11:24 AM

## 2024-09-21 NOTE — Progress Notes (Addendum)
 PROGRESS NOTE        PATIENT DETAILS Name: Jason Romero Age: 53 y.o. Sex: male Date of Birth: [birth year] Admit Date: 09/18/2024 Admitting Physician Editha Ram, MD ERE:Gnywdnw, Barnie NOVAK, MD  Brief Summary: Patient is a 53 y.o.  male with history of prior ICH-spastic right-sided hemiplegia, chronic A-fib not on anticoagulation-presented with hematuria, cough/malaise-found to have sepsis secondary to complicated UTI due to 1.8 cm ureteropelvic junction calculus.  Significant events: 11/8>> admit to [facility]  Significant studies: 11/7>> chest x-ray: Retrocardiac atelectasis or pneumonia 11/7>> CT renal stone study: Right renal pelvis calculi 1.8 cm without hydronephrosis.  Significant microbiology data: 11/7>> COVID/influenza/RSV PCR: Negative 11/7>> blood culture: E. coli  11/7>> urine culture:<10,000 colonies/mL  Procedures: 11/8>> cystoscopy-double-J right ureteral stent placement  Consults: None  Subjective: No major issues overnight-lying comfortably in bed.  Spouse/patient debating whether to go home or do SNF.  He is definitely weaker than his usual baseline per spouse.  Objective: Vitals: Blood pressure 125/89, pulse 87, temperature (!) 97.5 F (36.4 C), temperature source Oral, resp. rate 14, height 6' 2 (1.88 m), weight 103 kg, SpO2 95%.   Exam: Gen Exam:Alert awake-not in any distress HEENT:atraumatic, normocephalic Chest: B/L clear to auscultation anteriorly CVS:S1S2 regular Abdomen:soft non tender, non distended Extremities:no edema Neurology: Right-sided hemiplegia Skin: no rash  Pertinent Labs/Radiology:    Latest Ref Rng & Units 09/21/2024    3:26 AM 09/19/2024    4:31 AM 09/18/2024    9:12 PM  CBC  WBC 4.0 - 10.5 K/uL 7.9  14.5  16.2   Hemoglobin 13.0 - 17.0 g/dL 88.3  88.6  88.2   Hematocrit 39.0 - 52.0 % 34.5  33.5  35.5   Platelets 150 - 400 K/uL 316  248  266     Lab Results  Component Value Date   NA 140  09/21/2024   K 3.4 (L) 09/21/2024   CL 107 09/21/2024   CO2 23 09/21/2024      Assessment/Plan: Sepsis (POA) secondary to complicated UTI in the setting of 1.8 cm right UPJ stone and E. coli bacteremia Clinically improved-sepsis serology has resolved-with right ureteral stent placement and IV antibiotics. Urine culture surprisingly negative but blood cultures positive for E. coli Will consult pharmacy and switch to an appropriate oral antimicrobial agent.  Hematuria secondary to right UPJ stone Hematuria seems to have resolved S/p cystoscopy and right ureteral stent placement on 11/8 Urology will arrange for outpatient ureteroscopy or PCNL in a couple of weeks.  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  Code status:   Code Status: Full Code   DVT Prophylaxis: enoxaparin (LOVENOX) injection 40 mg Start: 09/20/24 1000 SCDs Start: 09/19/24 0415   Family Communication: Spouse at bedside   Disposition Plan: Status is: Inpatient Remains inpatient appropriate because: Severity of illness   Planned Discharge Destination:Home health   Diet: Diet Order             Diet regular Room service appropriate? Yes; Fluid consistency: Thin  Diet effective now                     Antimicrobial agents: Anti-infectives (From admission, onward)    Start     Dose/Rate Route Frequency Ordered Stop   09/20/24 2200  cefTRIAXone  (ROCEPHIN ) 2 g in sodium chloride  0.9 % 100 mL IVPB        2 g 200 mL/hr over 30 Minutes  Intravenous Every 24 hours 09/20/24 1405     09/19/24 2200  azithromycin  (ZITHROMAX ) 500 mg in sodium chloride  0.9 % 250 mL IVPB        500 mg 250 mL/hr over 60 Minutes Intravenous Every 24 hours 09/19/24 0416     09/19/24 2200  cefTRIAXone  (ROCEPHIN ) 1 g in sodium chloride  0.9 % 100 mL IVPB  Status:  Discontinued        1 g 200 mL/hr over 30 Minutes Intravenous Every 24 hours 09/19/24 0416 09/20/24 1405   09/18/24 2200  azithromycin  (ZITHROMAX ) 500 mg in sodium chloride  0.9 % 250 mL IVPB        500 mg 250 mL/hr over 60 Minutes Intravenous  Once 09/18/24 2153 09/19/24 0230   09/18/24 2100  cefTRIAXone  (ROCEPHIN ) 1 g in sodium chloride  0.9 % 100 mL IVPB        1 g 200 mL/hr over 30 Minutes Intravenous  Once 09/18/24 2046 09/18/24 2239        MEDICATIONS: Scheduled Meds:  atorvastatin   20 mg Oral Daily   baclofen   20 mg Oral TID   enoxaparin (LOVENOX) injection  40 mg Subcutaneous Q24H   influenza vac split trivalent PF  0.5 mL Intramuscular Tomorrow-1000   labetalol   100 mg Oral BID   pregabalin   150 mg Oral BID   Continuous Infusions:  azithromycin  (ZITHROMAX ) 500 mg in sodium chloride  0.9 % 250 mL IVPB Stopped (09/21/24 0027)   cefTRIAXone  (ROCEPHIN )  IV Stopped (09/20/24 2237)   PRN Meds:.acetaminophen  **OR** acetaminophen , albuterol , fentaNYL (SUBLIMAZE) injection, naLOXone (NARCAN)  injection, traZODone    I have personally reviewed following labs and imaging studies  LABORATORY DATA: CBC: Recent Labs  Lab 09/18/24 2112 09/19/24 0431 09/21/24 0326  WBC 16.2* 14.5* 7.9  HGB 11.7* 11.3* 11.6*  HCT 35.5* 33.5* 34.5*  MCV 82.4 81.1 81.0  PLT 266 248 316    Basic Metabolic Panel: Recent Labs  Lab 09/18/24 2112 09/19/24 0431 09/21/24 0326  NA 137 139 140  K 3.0* 3.5 3.4*  CL 104 105 107  CO2 20* 22 23  GLUCOSE 111* 100* 89  BUN 10 9 13   CREATININE 1.05 0.86 0.84  CALCIUM  8.4* 8.3* 8.3*  MG  --  1.7 2.1    GFR: Estimated Creatinine Clearance: 128.7  mL/min (by C-G formula based on SCr of 0.84 mg/dL).  Liver Function Tests: Recent Labs  Lab 09/18/24 2112  AST 12*  ALT 11  ALKPHOS 64  BILITOT 0.9  PROT 6.6  ALBUMIN 3.1*   No results for input(s): LIPASE, AMYLASE in the last 168 hours. No results for input(s): AMMONIA in the last 168 hours.  Coagulation Profile: No results for input(s): INR, PROTIME in the last 168 hours.  Cardiac Enzymes: No results for input(s): CKTOTAL, CKMB, CKMBINDEX, TROPONINI in the last 168 hours.  BNP (last 3 results) No results for input(s): PROBNP in the last 8760 hours.  Lipid Profile: No results for input(s): CHOL, HDL, LDLCALC, TRIG, CHOLHDL, LDLDIRECT in the last 72 hours.  Thyroid  Function Tests: No results for  input(s): TSH, T4TOTAL, FREET4, T3FREE, THYROIDAB in the last 72 hours.  Anemia Panel: No results for input(s): VITAMINB12, FOLATE, FERRITIN, TIBC, IRON, RETICCTPCT in the last 72 hours.  Urine analysis:    Component Value Date/Time   COLORURINE YELLOW 09/18/2024 2025   APPEARANCEUR CLOUDY (A) 09/18/2024 2025   LABSPEC 1.009 09/18/2024 2025   PHURINE 6.0 09/18/2024 2025   GLUCOSEU NEGATIVE 09/18/2024 2025   HGBUR MODERATE (A) 09/18/2024 2025   BILIRUBINUR NEGATIVE 09/18/2024 2025   BILIRUBINUR small 03/22/2016 1331   KETONESUR NEGATIVE 09/18/2024 2025   PROTEINUR 100 (A) 09/18/2024 2025   UROBILINOGEN 0.2 03/22/2016 1331   NITRITE NEGATIVE 09/18/2024 2025   LEUKOCYTESUR LARGE (A) 09/18/2024 2025    Sepsis Labs: Lactic Acid, Venous    Component Value Date/Time   LATICACIDVEN 0.9 09/18/2024 2123    MICROBIOLOGY: Recent Results (from the past 240 hours)  Urine Culture     Status: Abnormal   Collection Time: 09/18/24  8:26 PM   Specimen: Urine, Clean Catch  Result Value Ref Range Status   Specimen Description URINE, CLEAN CATCH  Final   Special Requests NONE  Final   Culture (A)  Final    <10,000 COLONIES/mL  INSIGNIFICANT GROWTH Performed at [facility], [address] [place], [place] [address]    Report Status 09/19/2024 FINAL  Final  Resp panel by RT-PCR (RSV, Flu A&B, Covid) Anterior Nasal Swab     Status: None   Collection Time: 09/18/24  9:12 PM   Specimen: Anterior Nasal Swab  Result Value Ref Range Status   SARS Coronavirus 2 by RT PCR NEGATIVE NEGATIVE Final   Influenza A by PCR NEGATIVE NEGATIVE Final   Influenza B by PCR NEGATIVE NEGATIVE Final    Comment: (NOTE) The Xpert Xpress SARS-CoV-2/FLU/RSV plus assay is intended as an aid in the diagnosis of influenza from Nasopharyngeal swab specimens and should not be used as a sole basis for treatment. Nasal washings and aspirates are unacceptable for Xpert Xpress SARS-CoV-2/FLU/RSV testing.  Fact Sheet for Patients: bloggercourse.com  Fact Sheet for Healthcare Providers: seriousbroker.it  This test is not yet approved or cleared by the [place]  FDA and has been authorized for detection and/or diagnosis of SARS-CoV-2 by FDA under an Emergency Use Authorization (EUA). This EUA will remain in effect (meaning this test can be used) for the duration of the COVID-19 declaration under Section 564(b)(1) of the Act, 21 U.S.C. section 360bbb-3(b)(1), unless the authorization is terminated or revoked.     Resp Syncytial Virus by PCR NEGATIVE NEGATIVE Final    Comment: (NOTE) Fact Sheet for Patients: bloggercourse.com  Fact Sheet for Healthcare Providers: seriousbroker.it  This test is not yet approved or cleared by the [place]  FDA and has been authorized for detection and/or diagnosis of SARS-CoV-2 by FDA under an Emergency Use Authorization (EUA). This EUA will remain in effect (meaning this test can be used) for the duration of the COVID-19 declaration under Section 564(b)(1) of the Act, 21 U.S.C. section  360bbb-3(b)(1), unless the authorization is terminated or revoked.  Performed at [facility], [address], [place], [place] [address]   Blood culture (routine x 2)     Status: Abnormal   Collection Time: 09/18/24  9:12 PM   Specimen: BLOOD LEFT ARM  Result Value Ref Range Status   Specimen Description BLOOD LEFT ARM  Final   Special Requests   Final    BOTTLES DRAWN AEROBIC AND ANAEROBIC Blood Culture adequate volume  Culture  Setup Time   Final    GRAM NEGATIVE RODS ANAEROBIC BOTTLE ONLY CRITICAL RESULT CALLED TO, READ BACK BY AND VERIFIED WITH: PHARMD JADE D. [id number] AT 1310, ADC Performed at [facility], [address], [place], [place] [address]    Culture ESCHERICHIA COLI (A)  Final   Report Status 09/21/2024 FINAL  Final   Organism ID, Bacteria ESCHERICHIA COLI  Final      Susceptibility   Escherichia coli - MIC*    AMPICILLIN >=32 RESISTANT Resistant     CEFAZOLIN (NON-URINE) 4 INTERMEDIATE Intermediate     CEFEPIME <=0.12 SENSITIVE Sensitive     ERTAPENEM <=0.12 SENSITIVE Sensitive     CEFTRIAXONE  <=0.25 SENSITIVE Sensitive     CIPROFLOXACIN <=0.06 SENSITIVE Sensitive     GENTAMICIN <=1 SENSITIVE Sensitive     MEROPENEM <=0.25 SENSITIVE Sensitive     TRIMETH/SULFA <=20 SENSITIVE Sensitive     AMPICILLIN/SULBACTAM 16 INTERMEDIATE Intermediate     PIP/TAZO Value in next row Sensitive      <=4 SENSITIVEThis is a modified FDA-approved test that has been validated and its performance characteristics determined by the reporting laboratory.  This laboratory is certified under the Clinical Laboratory Improvement Amendments CLIA as qualified to perform high complexity clinical laboratory testing.    * ESCHERICHIA COLI  Blood culture (routine x 2)     Status: None (Preliminary result)   Collection Time: 09/18/24  9:12 PM   Specimen: BLOOD RIGHT ARM  Result Value Ref Range Status   Specimen Description BLOOD RIGHT ARM  Final   Special Requests   Final     BOTTLES DRAWN AEROBIC AND ANAEROBIC Blood Culture adequate volume   Culture   Final    NO GROWTH 3 DAYS Performed at [facility], [address], [place], [place] [address]    Report Status PENDING  Incomplete  Blood Culture ID Panel (Reflexed)     Status: Abnormal   Collection Time: 09/18/24  9:12 PM  Result Value Ref Range Status   Enterococcus faecalis NOT DETECTED NOT DETECTED Final   Enterococcus Faecium NOT DETECTED NOT DETECTED Final   Listeria monocytogenes NOT DETECTED NOT DETECTED Final   Staphylococcus species NOT DETECTED NOT DETECTED Final   Staphylococcus aureus (BCID) NOT DETECTED NOT DETECTED Final   Staphylococcus epidermidis NOT DETECTED NOT DETECTED Final   Staphylococcus lugdunensis NOT DETECTED NOT DETECTED Final   Streptococcus species NOT DETECTED NOT DETECTED Final   Streptococcus agalactiae NOT DETECTED NOT DETECTED Final   Streptococcus pneumoniae NOT DETECTED NOT DETECTED Final   Streptococcus pyogenes NOT DETECTED NOT DETECTED Final   A.calcoaceticus-baumannii NOT DETECTED NOT DETECTED Final   Bacteroides fragilis NOT DETECTED NOT DETECTED Final   Enterobacterales DETECTED (A) NOT DETECTED Final    Comment: Enterobacterales represent a large order of gram negative bacteria, not a single organism. CRITICAL RESULT CALLED TO, READ BACK BY AND VERIFIED WITH: PHARMD JADE D. [id number] AT 1310, ADC    Enterobacter cloacae complex NOT DETECTED NOT DETECTED Final   Escherichia coli DETECTED (A) NOT DETECTED Final    Comment: CRITICAL RESULT CALLED TO, READ BACK BY AND VERIFIED WITH: PHARMD JADE D. [id number] AT 1310, ADC    Klebsiella aerogenes NOT DETECTED NOT DETECTED Final   Klebsiella oxytoca NOT DETECTED NOT DETECTED Final   Klebsiella pneumoniae NOT DETECTED NOT DETECTED Final   Proteus species NOT DETECTED NOT DETECTED Final   Salmonella species NOT DETECTED NOT DETECTED Final   Serratia marcescens NOT  DETECTED NOT DETECTED Final   Haemophilus  influenzae NOT DETECTED NOT DETECTED Final   Neisseria meningitidis NOT DETECTED NOT DETECTED Final   Pseudomonas aeruginosa NOT DETECTED NOT DETECTED Final   Stenotrophomonas maltophilia NOT DETECTED NOT DETECTED Final   Candida albicans NOT DETECTED NOT DETECTED Final   Candida auris NOT DETECTED NOT DETECTED Final   Candida glabrata NOT DETECTED NOT DETECTED Final   Candida krusei NOT DETECTED NOT DETECTED Final   Candida parapsilosis NOT DETECTED NOT DETECTED Final   Candida tropicalis NOT DETECTED NOT DETECTED Final   Cryptococcus neoformans/gattii NOT DETECTED NOT DETECTED Final   CTX-M ESBL NOT DETECTED NOT DETECTED Final   Carbapenem resistance IMP NOT DETECTED NOT DETECTED Final   Carbapenem resistance KPC NOT DETECTED NOT DETECTED Final   Carbapenem resistance NDM NOT DETECTED NOT DETECTED Final   Carbapenem resist OXA 48 LIKE NOT DETECTED NOT DETECTED Final   Carbapenem resistance VIM NOT DETECTED NOT DETECTED Final    Comment: Performed at [facility], [address], [place], [place] [address]    RADIOLOGY STUDIES/RESULTS: DG C-Arm 1-60 Min-No Report Result Date: 09/19/2024 Fluoroscopy was utilized by the requesting physician.  No radiographic interpretation.     LOS: 2 days   Donalda Applebaum, MD  [facility]    To contact the attending provider between 7A-7P or the covering provider during after hours 7P-7A, please log into the web site [email or web address] and access using universal [place] password for that web site. If you do not have the password, please call the hospital operator.  09/21/2024, 9:47 AM

## 2024-09-21 NOTE — TOC Progression Note (Signed)
 Transition of Care [facility]) - Progression Note    Patient Details  Name: Jason Romero MRN: [id number] Date of Birth: 1971/09/30  [facility]) CM/SW Contact  Nola Devere Hands, RN Phone Number: 09/21/2024, 4:23 PM  Clinical Narrative:    Case Manager requested a 20inch wheelchair for patient from [employer], asked that it be delivered to patients home, he will be transported home via [name]. No further needs identified.    Expected Discharge Plan: Home/Self Care Barriers to Discharge: Continued Medical Work up               Expected Discharge Plan and Services   Discharge Planning Services: CM Consult Post Acute Care Choice: Durable Medical Equipment                   DME Arranged: Wheelchair manual DME Agency: [employer] Date DME Agency Contacted: 09/21/24 Time DME Agency Contacted: [phone] Representative spoke with at DME Agency: [facility]udge HH Arranged: NA HH Agency: NA         Social Drivers of Health (SDOH) Interventions SDOH Screenings   Food Insecurity: No Food Insecurity (09/19/2024)  Housing: Low Risk  (09/19/2024)  Transportation Needs: No Transportation Needs (09/19/2024)  Utilities: Not At Risk (09/19/2024)  Alcohol Screen: Low Risk  (11/04/2023)  Depression (PHQ2-9): Low Risk  (07/06/2024)  Financial Resource Strain: Medium Risk (11/08/2021)  Physical Activity: Inactive (11/04/2023)  Social Connections: Moderately Isolated (11/04/2023)  Stress: No Stress Concern Present (11/04/2023)  Tobacco Use: High Risk (09/19/2024)  Health Literacy: Inadequate Health Literacy (11/04/2023)    Readmission Risk Interventions     No data to display

## 2024-09-21 NOTE — Progress Notes (Addendum)
   09/21/24 1045  Mobility  Activity Stood at bedside (X3)  Level of Assistance Moderate assist, patient does 50-74%  Assistive Device Front wheel walker  Activity Response Tolerated fair  Mobility Referral Yes  Mobility visit 1 Mobility  Mobility Specialist Start Time (ACUTE ONLY) 1045  Mobility Specialist Stop Time (ACUTE ONLY) 1111  Mobility Specialist Time Calculation (min) (ACUTE ONLY) 26 min   Mobility Specialist: Progress Note  Pt agreeable to mobility session - received in bed. C/o pain all over and  abd pain. Returned to bed with all needs met - call bell within reach. Bed alarm on.   Additional comments: STSx3 cleared bed with flexed trunk, not a full stand. BUE is flaccid, required MaxA.   Virgle Boards, BS Mobility Specialist Please contact via SecureChat or  Rehab office at [phone].

## 2024-09-21 NOTE — NC FL2 (Signed)
 [place]  MEDICAID FL2 LEVEL OF CARE FORM     IDENTIFICATION  Patient Name: Jason Romero Birthdate: Jun 14, 1971 Sex: male Admission Date (Current Location): 09/18/2024  [facility] and [place] Number:  Producer, Television/film/video and Address:  [facility] [place], [address] [place], [place] [address]      Provider Number: [id number]  Attending Physician Name and Address:  Raenelle Donalda HERO, MD  Relative Name and Phone Number:       Current Level of Care: Hospital Recommended Level of Care: Skilled Nursing Facility Prior Approval Number:    Date Approved/Denied:   PASRR Number: [id number]  Discharge Plan: SNF    Current Diagnoses: Patient Active Problem List   Diagnosis Date Noted   CAP (community acquired pneumonia) 09/19/2024   Hematuria 09/19/2024   Hypokalemia 09/19/2024   Dental abscess 09/27/2021   Chronic hepatitis C without hepatic coma (HCC) 02/28/2021   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) 02/14/2021   Hyperlipidemia 02/14/2021   Visual changes 02/14/2021   Obesity 02/12/2021   Back pain 02/12/2021   Depression 01/26/2021   Spastic hemiplegia affecting nondominant side (HCC)    Dysphagia due to recent stroke 09/20/2020   History of intracranial hemorrhage 09/20/2020   History of stroke with residual deficit 09/11/2020   Sleep apnea 09/06/2016   Tobacco abuse 09/06/2016   UTI (urinary tract infection) 05/08/2016   Unintentional weight loss 03/22/2016   Essential hypertension 02/12/2016   Anxiety 02/12/2016   Migraines 02/12/2016   COPD (chronic obstructive pulmonary disease) (HCC) 02/01/2016   Gastroesophageal reflux disease without esophagitis 02/01/2016    Orientation RESPIRATION BLADDER Height & Weight     Self, Time, Situation, Place  Normal Incontinent, External catheter Weight: 227 lb 1.2 oz (103 kg) Height:  6' 2 (188 cm)  BEHAVIORAL SYMPTOMS/MOOD NEUROLOGICAL BOWEL NUTRITION STATUS       Incontinent Diet (See dc summary)  AMBULATORY STATUS COMMUNICATION OF NEEDS Skin   Limited Assist Verbally Normal                       Personal Care Assistance Level of Assistance  Bathing, Feeding, Dressing Bathing Assistance: Maximum assistance Feeding assistance: Independent Dressing Assistance: Limited assistance     Functional Limitations Info             SPECIAL CARE FACTORS FREQUENCY  PT (By licensed PT), OT (By licensed OT)     PT Frequency: Eval and treat OT Frequency: Eval and treat            Contractures Contractures Info: Not present    Additional Factors Info  Code Status, Allergies Code Status Info: Full Allergies Info: Hydrocodone, Ibuprofen           Current Medications (09/21/2024):  This is the current hospital active medication list Current Facility-Administered Medications  Medication Dose Route Frequency Provider Last Rate Last Admin   acetaminophen  (TYLENOL ) tablet 650 mg  650 mg Oral Q6H PRN Rathore, Vasundhra, MD   650 mg at 09/21/24 9571   Or   acetaminophen  (TYLENOL ) suppository 650 mg  650 mg Rectal Q6H PRN Alfornia Madison, MD       albuterol  (PROVENTIL ) (2.5 MG/3ML) 0.083% nebulizer solution 3 mL  3 mL Inhalation Q6H PRN Alfornia Madison, MD       atorvastatin  (LIPITOR) tablet 20 mg  20 mg Oral Daily Ghimire, Shanker M, MD   20 mg at 09/21/24 9187   azithromycin  (ZITHROMAX )  500 mg in sodium chloride  0.9 % 250 mL IVPB  500 mg Intravenous Q24H Rathore, Vasundhra, MD   Stopped at 09/21/24 0027   baclofen  (LIORESAL ) tablet 20 mg  20 mg Oral TID Ghimire, Shanker M, MD   20 mg at 09/21/24 9187   cefTRIAXone  (ROCEPHIN ) 2 g in sodium chloride  0.9 % 100 mL IVPB  2 g Intravenous Q24H Raenelle Donalda HERO, MD   Stopped at 09/20/24 2237   enoxaparin (LOVENOX) injection 40 mg  40 mg Subcutaneous Q24H Raenelle Donalda HERO, MD   40 mg at 09/21/24 9187   fentaNYL (SUBLIMAZE) injection 12.5 mcg  12.5 mcg Intravenous Q2H PRN Alfornia Madison, MD    12.5 mcg at 09/20/24 2203   influenza vac split trivalent PF (FLUZONE) injection 0.5 mL  0.5 mL Intramuscular Tomorrow-1000 Odell Celinda Balo, MD       labetalol  (NORMODYNE ) tablet 100 mg  100 mg Oral BID Raenelle Donalda HERO, MD   100 mg at 09/21/24 9187   naloxone (NARCAN) injection 0.4 mg  0.4 mg Intravenous PRN Rathore, Vasundhra, MD       pregabalin  (LYRICA ) capsule 150 mg  150 mg Oral BID Raenelle Donalda HERO, MD   150 mg at 09/21/24 9187   traZODone  (DESYREL ) tablet 150 mg  150 mg Oral QHS PRN Raenelle Donalda HERO, MD         Discharge Medications: Please see discharge summary for a list of discharge medications.  Relevant Imaging Results:  Relevant Lab Results:   Additional Information SSN [id number]  Jason GORMAN Kindle, LCSW

## 2024-09-21 NOTE — Evaluation (Signed)
 Occupational Therapy Evaluation Patient Details Name: Jason Romero MRN: [id number] DOB: 01/21/1971 Today's Date: 09/21/2024   History of Present Illness   The pt is a 53 yo male presenting 11/7 with blood in urine. S/p cystoscopy with stent placement 11/8. PMH includes: HTN, HLD, CVA with R-sided hemiplegia, migranes, GERD, hep C, COPD, OSA, afib not on anticoagulation, seizures, and anemia.     Clinical Impressions PTA, pt lived with spouse and his mother and reports needing assist with BADL and transfers. Upon eval, pt with general weakness, residual R sided hemiplegia, decreased insight, safety, balance. Pt and wife with unclear report whether he has began transferring less due to weakness, or if he spends his days in bed vs EOB at baseline. Pt needing up to mod A for bed mobility and max A for STS transfers as well as set up-min A for seated grooming tasks. Pt to benefit from inpatient rehab <3 hours; however, likely to refuse as pt states he would rather go home.      If plan is discharge home, recommend the following:   Two people to help with walking and/or transfers;A lot of help with bathing/dressing/bathroom;Two people to help with bathing/dressing/bathroom;Assistance with cooking/housework;Direct supervision/assist for financial management;Assist for transportation;Direct supervision/assist for medications management;Help with stairs or ramp for entrance     Functional Status Assessment   Patient has had a recent decline in their functional status and demonstrates the ability to make significant improvements in function in a reasonable and predictable amount of time.     Equipment Recommendations   Other (comment) (drop arm BSC, drop arm wheelchair)     Recommendations for Other Services         Precautions/Restrictions   Precautions Precautions: Fall Recall of Precautions/Restrictions: Intact Precaution/Restrictions Comments: chronic R  hemi Restrictions Weight Bearing Restrictions Per Provider Order: No     Mobility Bed Mobility Overal bed mobility: Needs Assistance Bed Mobility: Supine to Sit     Supine to sit: Mod assist, HOB elevated     General bed mobility comments: min-modA to manage RLE and trunk elevation    Transfers Overall transfer level: Needs assistance Equipment used: 1 person hand held assist (LUE supported on locked recliner) Transfers: Sit to/from Stand Sit to Stand: Max assist, Mod assist           General transfer comment: heavy boost for hip clearance then mod A for second half of rise.      Balance Overall balance assessment: Needs assistance Sitting-balance support: Single extremity supported, Feet supported Sitting balance-Leahy Scale: Poor     Standing balance support: Single extremity supported, During functional activity Standing balance-Leahy Scale: Zero Standing balance comment: dependent on therapist support due to RLE buckling, decreased activity tolerance                           ADL either performed or assessed with clinical judgement   ADL Overall ADL's : Needs assistance/impaired Eating/Feeding: Set up;Sitting   Grooming: Set up;Sitting;Cueing for compensatory techniques   Upper Body Bathing: Moderate assistance;Sitting   Lower Body Bathing: Maximal assistance;Sitting/lateral leans;Sit to/from stand   Upper Body Dressing : Sitting;Moderate assistance   Lower Body Dressing: Maximal assistance;Sitting/lateral leans;Sit to/from stand   Toilet Transfer: Maximal assistance Toilet Transfer Details (indicate cue type and reason): STS only this session                 Vision   Additional Comments: not formally assessed  this session, pt denies changes from his baseline     Perception         Praxis         Pertinent Vitals/Pain Pain Assessment Pain Assessment: No/denies pain     Extremity/Trunk Assessment Upper Extremity  Assessment Upper Extremity Assessment: RUE deficits/detail RUE Deficits / Details: chronic R hemi, clonus at wrist, finger flexion contracture, spastic at times. RUE Sensation: decreased light touch;decreased proprioception RUE Coordination: decreased fine motor;decreased gross motor   Lower Extremity Assessment Lower Extremity Assessment: Defer to PT evaluation   Cervical / Trunk Assessment Cervical / Trunk Assessment: Normal   Communication Communication Communication: No apparent difficulties   Cognition Arousal: Alert Behavior During Therapy: Flat affect Cognition: Cognition impaired   Orientation impairments: Situation Awareness: Intellectual awareness intact, Online awareness impaired   Attention impairment (select first level of impairment): Selective attention Executive functioning impairment (select all impairments): Sequencing, Reasoning, Problem solving OT - Cognition Comments: poor insight into deficits and how to maintain current functional abilities                 Following commands: Impaired Following commands impaired: Follows one step commands with increased time     Cueing  General Comments   Cueing Techniques: Verbal cues  VSS on RA   Exercises     Shoulder Instructions      Home Living Family/patient expects to be discharged to:: Private residence Living Arrangements: Spouse/significant other;Parent Available Help at Discharge: Family;Available 24 hours/day Type of Home: House Home Access: Ramped entrance     Home Layout: One level     Bathroom Shower/Tub: Sponge bathes at baseline         Home Equipment: Wheelchair - manual (wc is broken, cannot get another one until 2026. hemi walker)          Prior Functioning/Environment Prior Level of Function : Needs assist       Physical Assist : Mobility (physical);ADLs (physical) Mobility (physical): Bed mobility;Transfers ADLs (physical):  Grooming;Bathing;Dressing;Toileting;IADLs Mobility Comments: wife assists with sit-stand and pivot transfers, unable to complete recently due to LLE weakness ADLs Comments: wife does all ADLs and IADLs, pt can feed himself    OT Problem List: Decreased strength;Decreased activity tolerance;Impaired balance (sitting and/or standing);Decreased safety awareness;Decreased knowledge of use of DME or AE;Impaired sensation;Impaired tone;Impaired UE functional use   OT Treatment/Interventions: Self-care/ADL training;Therapeutic exercise;DME and/or AE instruction;Therapeutic activities;Balance training;Patient/family education;Cognitive remediation/compensation      OT Goals(Current goals can be found in the care plan section)   Acute Rehab OT Goals Patient Stated Goal: get better OT Goal Formulation: With patient Time For Goal Achievement: 10/05/24 Potential to Achieve Goals: Good   OT Frequency:  Min 2X/week    Co-evaluation              AM-PAC OT 6 Clicks Daily Activity     Outcome Measure Help from another person eating meals?: A Little Help from another person taking care of personal grooming?: A Little Help from another person toileting, which includes using toliet, bedpan, or urinal?: Total Help from another person bathing (including washing, rinsing, drying)?: A Lot Help from another person to put on and taking off regular upper body clothing?: A Lot Help from another person to put on and taking off regular lower body clothing?: A Lot 6 Click Score: 13   End of Session Equipment Utilized During Treatment: Gait belt Nurse Communication: Mobility status  Activity Tolerance: Patient tolerated treatment well Patient left: in bed;with call bell/phone  within reach;with bed alarm set;Other (comment);with family/visitor present (EOB with wife and SLP present with RN outside room in hall)  OT Visit Diagnosis: Unsteadiness on feet (R26.81);Muscle weakness (generalized)  (M62.81);Hemiplegia and hemiparesis                Time: 9183-9154 OT Time Calculation (min): 29 min Charges:  OT General Charges $OT Visit: 1 Visit OT Evaluation $OT Eval Low Complexity: 1 Low OT Treatments $Self Care/Home Management : 8-22 mins  Elma JONETTA Lebron FREDERICK, OTR/L [facility] Acute Rehabilitation Office: [phone]   Elma JONETTA Lebron 09/21/2024, 10:40 AM

## 2024-09-21 NOTE — TOC Initial Note (Signed)
 Transition of Care [facility]) - Initial/Assessment Note    Patient Details  Name: Jason Romero MRN: [id number] Date of Birth: 11-05-1971  [facility]) CM/SW Contact:    Jason GORMAN Kindle, LCSW Phone Number: 09/21/2024, 4:24 PM  Clinical Narrative:                 CSW received consult for possible SNF at time of discharge. CSW spoke with patient. Patient reported that he would probably like to return home at discharge but requested CSW speak with his wife. Patient reported he receives half of his disability check due to them living with his mother. CSW spoke with patient's spouse. She confirmed that she would like patient to return home and not go to SNF. CSW discussed equipment needs and patient requested a new and bigger wheelchair. RNCM reached out to [facility] for delivery to the home. CSW confirmed PCP and address. Patient's spouse requested PTAR at discharge. Patient has an aide about 3 hours a day M-F through his Medicaid. No further questions reported at this time.    Expected Discharge Plan: Home/Self Care Barriers to Discharge: Continued Medical Work up   Patient Goals and CMS Choice Patient states their goals for this hospitalization and ongoing recovery are:: Return home CMS Medicare.gov Compare Post Acute Care list provided to:: Patient Choice offered to / list presented to : Patient, Spouse [place] ownership interest in [facility].provided to:: Spouse    Expected Discharge Plan and Services In-house Referral: Clinical Social Work Discharge Planning Services: CM Consult Post Acute Care Choice: Durable Medical Equipment Living arrangements for the past 2 months: Single Family Home                 DME Arranged: Wheelchair manual DME Agency: [employer] Date DME Agency Contacted: 09/21/24 Time DME Agency Contacted: [phone] Representative spoke with at DME Agency: London Budge HH Arranged: NA HH Agency: NA        Prior Living  Arrangements/Services Living arrangements for the past 2 months: Single Family Home Lives with:: Parents, Spouse Patient language and need for interpreter reviewed:: Yes Do you feel safe going back to the place where you live?: Yes      Need for Family Participation in Patient Care: Yes (Comment) Care giver support system in place?: Yes (comment) Current home services: DME, Homehealth aide (wheelchair, [facility]) Criminal Activity/Legal Involvement Pertinent to Current Situation/Hospitalization: No - Comment as needed  Activities of Daily Living   ADL Screening (condition at time of admission) Independently performs ADLs?: No Does the patient have a NEW difficulty with bathing/dressing/toileting/self-feeding that is expected to last >3 days?: No Does the patient have a NEW difficulty with getting in/out of bed, walking, or climbing stairs that is expected to last >3 days?: No Does the patient have a NEW difficulty with communication that is expected to last >3 days?: No Is the patient deaf or have difficulty hearing?: No Does the patient have difficulty seeing, even when wearing glasses/contacts?: No Does the patient have difficulty concentrating, remembering, or making decisions?: Yes  Permission Sought/Granted Permission sought to share information with : Facility Medical Sales Representative, Family Supports Permission granted to share information with : Yes, Verbal Permission Granted  Share Information with NAME: Jason Romero, Jason Romero- Spouse [phone]  Permission granted to share info w AGENCY: SNFs        Emotional Assessment Appearance:: Appears stated age Attitude/Demeanor/Rapport: Engaged Affect (typically observed): Accepting Orientation: : Oriented to Self, Oriented to Place, Oriented to  Time, Oriented to Situation Alcohol / Substance Use: Not Applicable Psych Involvement: No (comment)  Admission diagnosis:  Hypokalemia [E87.6] CAP (community acquired pneumonia)  [J18.9] Acute cystitis with hematuria [N30.01] Pneumonia of left lower lobe due to infectious organism [J18.9] Patient Active Problem List   Diagnosis Date Noted   CAP (community acquired pneumonia) 09/19/2024   Hematuria 09/19/2024   Hypokalemia 09/19/2024   Dental abscess 09/27/2021   Chronic hepatitis C without hepatic coma (HCC) 02/28/2021   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) 02/14/2021   Hyperlipidemia 02/14/2021   Visual changes 02/14/2021   Obesity 02/12/2021   Back pain 02/12/2021   Depression 01/26/2021   Spastic hemiplegia affecting nondominant side (HCC)    Dysphagia due to recent stroke 09/20/2020   History of intracranial hemorrhage 09/20/2020   History of stroke with residual deficit 09/11/2020   Sleep apnea 09/06/2016   Tobacco abuse 09/06/2016   UTI (urinary tract infection) 05/08/2016   Unintentional weight loss 03/22/2016   Essential hypertension 02/12/2016   Anxiety 02/12/2016   Migraines 02/12/2016   COPD (chronic obstructive pulmonary disease) (HCC) 02/01/2016   Gastroesophageal reflux disease without esophagitis 02/01/2016   PCP:  Jason Barnie NOVAK, MD Pharmacy:   [facility] [address] [place] [address] Phone: [phone] Fax: [phone]  [facility] [address] [place] [address] Phone: [phone] Fax: [phone]     Social Drivers of Health (SDOH) Social History: SDOH Screenings   Food Insecurity: No Food Insecurity (09/19/2024)  Housing: Low Risk  (09/19/2024)  Transportation Needs: No Transportation Needs (09/19/2024)  Utilities: Not At Risk (09/19/2024)  Alcohol Screen: Low Risk  (11/04/2023)  Depression (PHQ2-9): Low Risk  (07/06/2024)  Financial Resource Strain: Medium Risk (11/08/2021)  Physical Activity: Inactive (11/04/2023)  Social Connections: Moderately Isolated (11/04/2023)  Stress: No Stress Concern Present  (11/04/2023)  Tobacco Use: High Risk (09/19/2024)  Health Literacy: Inadequate Health Literacy (11/04/2023)   SDOH Interventions:     Readmission Risk Interventions     No data to display

## 2024-09-22 ENCOUNTER — Other Ambulatory Visit (HOSPITAL_COMMUNITY): Payer: Self-pay

## 2024-09-22 DIAGNOSIS — N3001 Acute cystitis with hematuria: Secondary | ICD-10-CM | POA: Diagnosis not present

## 2024-09-22 DIAGNOSIS — I10 Essential (primary) hypertension: Secondary | ICD-10-CM | POA: Diagnosis not present

## 2024-09-22 DIAGNOSIS — E876 Hypokalemia: Secondary | ICD-10-CM | POA: Diagnosis not present

## 2024-09-22 DIAGNOSIS — N200 Calculus of kidney: Secondary | ICD-10-CM | POA: Diagnosis not present

## 2024-09-22 LAB — BASIC METABOLIC PANEL WITH GFR
Anion gap: 8 (ref 5–15)
BUN: 11 mg/dL (ref 6–20)
CO2: 23 mmol/L (ref 22–32)
Calcium: 8.1 mg/dL — ABNORMAL LOW (ref 8.9–10.3)
Chloride: 107 mmol/L (ref 98–111)
Creatinine, Ser: 0.78 mg/dL (ref 0.61–1.24)
GFR, Estimated: >60 mL/min (ref >60)
Glucose, Bld: 91 mg/dL (ref 70–99)
Potassium: 3.3 mmol/L — ABNORMAL LOW (ref 3.5–5.1)
Sodium: 138 mmol/L (ref 135–145)

## 2024-09-22 MED ORDER — POTASSIUM CHLORIDE CRYS ER 20 MEQ PO TBCR
40.0000 meq | EXTENDED_RELEASE_TABLET | ORAL | Status: DC
Start: 2024-09-22 — End: 2024-09-22
  Administered 2024-09-22: 40 meq via ORAL
  Filled 2024-09-22: qty 2

## 2024-09-22 MED ORDER — CIPROFLOXACIN HCL 500 MG PO TABS
500.0000 mg | ORAL_TABLET | Freq: Two times a day (BID) | ORAL | 0 refills | Status: DC
  Filled 2024-09-22: qty 19, 10d supply, fill #0

## 2024-09-22 NOTE — Discharge Summary (Signed)
 PATIENT DETAILS Name: Jason Romero Age: 53 y.o. Sex: male Date of Birth: 1971-04-16 MRN: [id number]. Admitting Physician: Editha Ram, MD ERE:Gnywdnw, Barnie NOVAK, MD  Admit Date: 09/18/2024 Discharge date: 09/22/2024  Recommendations for Outpatient Follow-up:  Follow up with PCP in 1-2 weeks Please obtain CMP/CBC in one week Please ensure follow up with Urology Repeat CT abdomen in 1-2 months to assess for resolution of enlarged retroperitoneal/iliac lymphadenopathy (presumed reactive) Amlodipine /hydralazine  remain on hold-blood pressure currently controlled with just labetalol -reassess in resume accordingly.  Admitted From:  Home  Disposition: Home health (refused SNF)   Discharge Condition: good  CODE STATUS:   Code Status: Full Code   Diet recommendation:  Diet Order             Diet - low sodium heart healthy           Diet regular Room service appropriate? Yes; Fluid consistency: Thin  Diet effective now                    Brief Summary: Patient is a 53 y.o.  male with history of prior ICH-spastic right-sided hemiplegia, chronic A-fib not on anticoagulation-presented with hematuria, cough/malaise-found to have sepsis secondary to complicated UTI due to 1.8 cm ureteropelvic junction calculus.   Significant events: 11/8>> admit to [facility]   Significant studies: 11/7>> chest x-ray: Retrocardiac atelectasis or pneumonia 11/7>> CT renal stone study: Right renal pelvis calculi 1.8 cm without hydronephrosis.   Significant microbiology data: 11/7>> COVID/influenza/RSV PCR: Negative 11/7>> blood culture: E. coli  11/7>> urine culture:<10,000 colonies/mL   Procedures: 11/8>> cystoscopy-double-J right ureteral stent placement   Consults: None  Brief Hospital Course: Sepsis (POA) secondary to complicated UTI in the setting of 1.8 cm right UPJ stone and E. coli bacteremia Clinically improved-sepsis physiology has resolved with ureteral stent placement  and antimicrobial therapy.   Urine culture surprisingly negative but blood cultures positive for E. coli Since clinically improved-switched over to oral ciprofloxacin on 11/10-this will be continued on discharge. Please ensure follow-up with outpatient urology for ureteroscopy or PCNL in a couple of weeks.   Hematuria secondary to right UPJ stone Hematuria seems to have resolved S/p cystoscopy and right ureteral stent placement on 11/8 Urology will arrange for outpatient ureteroscopy or PCNL in a couple of weeks.   Hypokalemia Repleted-recheck in PCPs office.   HTN BP stable-continue labetalol  (lower than usual home dose)  Resume amlodipine /hydralazine  when able-reassess at PCPs office.   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   Discharge Diagnoses:  Principal Problem:   CAP (community acquired pneumonia) Active Problems:   Essential hypertension   UTI (urinary tract infection)   Hematuria   Hypokalemia   Discharge Instructions:  Activity:  As tolerated with Full fall precautions use walker/cane & assistance as needed  Discharge Instructions     Call MD for:  persistant dizziness or light-headedness   Complete by: As directed    Call MD for:  persistant nausea and vomiting   Complete by: As directed    Diet - low sodium heart healthy   Complete by: As directed    Discharge instructions   Complete by: As directed    Follow with Primary MD  Vicci,  Barnie NOVAK, MD in 1-2 weeks  Please get a complete blood count and chemistry panel checked by your Primary MD at your next visit, and again as instructed by your Primary MD.  Get Medicines  reviewed and adjusted: Please take all your medications with you for your next visit with your Primary MD  Laboratory/radiological data: Please request your Primary MD to go over all hospital tests and procedure/radiological results at the follow up, please ask your Primary MD to get all Hospital records sent to his/her office.  In some cases, they will be blood work, cultures and biopsy results pending at the time of your discharge. Please request that your primary care M.D. follows up on these results.  Also Note the following: If you experience worsening of your admission symptoms, develop shortness of breath, life threatening emergency, suicidal or homicidal thoughts you must seek medical attention immediately by calling 911 or calling your MD immediately  if symptoms less severe.  You must read complete instructions/literature along with all the possible adverse reactions/side effects for all the Medicines you take and that have been prescribed to you. Take any new Medicines after you have completely understood and accpet all the possible adverse reactions/side effects.   Do not drive when taking Pain medications or sleeping medications (Benzodaizepines)  Do not take more than prescribed Pain, Sleep and Anxiety Medications. It is not advisable to combine anxiety,sleep and pain medications without talking with your primary care practitioner  Special Instructions: If you have smoked or chewed Tobacco  in the last 2 yrs please stop smoking, stop any regular Alcohol  and or any Recreational drug use.  Wear Seat belts while driving.  Please note: You were cared for by a hospitalist during your hospital stay. Once you are discharged, your primary care physician will handle any further medical issues. Please note that NO REFILLS for any discharge medications will be authorized once you are discharged, as it is imperative that you return to your primary care physician (or establish a relationship  with a primary care physician if you do not have one) for your post hospital discharge needs so that they can reassess your need for medications and monitor your lab values.   Increase activity slowly   Complete by: As directed       Allergies as of 09/22/2024       Reactions   Hydrocodone Hives   Ibuprofen Other (See Comments)   Pt can't take this medication because it interacts with other medications that he is taking.         Medication List     PAUSE taking these medications    amLODipine  10 MG tablet Wait to take this until your doctor or other care provider tells you to start again. Commonly known as: NORVASC  Take 1 tablet (10 mg total) by mouth daily.   hydrALAZINE  50 MG tablet Wait to take this until your doctor or other care provider tells you to start again. Commonly known as: APRESOLINE  Take 1 tablet (50 mg total) by mouth in the morning and at bedtime.       TAKE these medications    acetaminophen  325 MG tablet Commonly known as: TYLENOL  Take 2 tablets (650 mg total) by mouth every 4 (four) hours as needed for mild pain (or temp > 37.5 C (99.5 F)).   albuterol  108 (90 Base) MCG/ACT inhaler Commonly known as: Ventolin  HFA Inhale 2 puffs into the lungs every 6 (six) hours as needed for wheezing or  shortness of breath.   aspirin  EC 81 MG tablet Take 1 tablet (81 mg total) by mouth daily. Swallow whole.   atorvastatin  20 MG tablet Commonly known as: Lipitor Take 1 tablet (20 mg total) by mouth daily.   baclofen  20 MG tablet Commonly known as: LIORESAL  Take 1 tablet (20 mg total) by mouth 3 (three) times daily.   Blood Pressure Kit Devi Use to monitor blood pressure   ciprofloxacin 500 MG tablet Commonly known as: CIPRO Take 1 tablet (500 mg total) by mouth 2 (two) times daily.   hydrOXYzine  50 MG capsule Commonly known as: VISTARIL  Take 1 capsule (50 mg total) by mouth every 8 (eight) hours as needed for anxiety.   labetalol  300 MG  tablet Commonly known as: NORMODYNE  Take 1 tablet (300 mg total) by mouth 2 (two) times daily.   pregabalin  150 MG capsule Commonly known as: Lyrica  Take 1 capsule (150 mg total) by mouth 2 (two) times daily.   traZODone  100 MG tablet Commonly known as: DESYREL  Take 1.5 tablets (150 mg total) by mouth at bedtime as needed for sleep.               Durable Medical Equipment  (From admission, onward)           Start     Ordered   09/21/24 1618  For home use only DME lightweight manual wheelchair with seat cushion  Once       Comments: Patient suffers from pneumonia/generalized weakness which impairs their ability to perform daily activities like ambulating in the home.  A cane will not resolve  issue with performing activities of daily living. A wheelchair will allow patient to safely perform daily activities. Patient is not able to propel themselves in the home using a standard weight wheelchair due to weakness. Patient can self propel in the lightweight wheelchair. Length of need 6 months. Accessories: elevating leg rests (ELRs), wheel locks, extensions and anti-tippers.   09/21/24 1619            Follow-up Information     Vicci Barnie NOVAK, MD. Schedule an appointment as soon as possible for a visit in 1 week(s).   Specialty: Internal Medicine Contact information: [address] [place] [address] [phone]                Allergies  Allergen Reactions   Hydrocodone Hives   Ibuprofen Other (See Comments)    Pt can't take this medication because it interacts with other medications that he is taking.      Other Procedures/Studies: DG C-Arm 1-60 Min-No Report Result Date: 09/19/2024 Fluoroscopy was utilized by the requesting physician.  No radiographic interpretation.   CT Renal Stone Study Result Date: 09/18/2024 EXAM: CT UROGRAM 09/18/2024 11:43:03 PM TECHNIQUE: CT of the abdomen and pelvis was performed without the administration  of intravenous contrast as per CT urogram protocol. Multiplanar reformatted images as well as MIP urogram images are provided for review. Automated exposure control, iterative reconstruction, and/or weight based adjustment of the mA/kV was utilized to reduce the radiation dose to as low as reasonably achievable. COMPARISON: None available. CLINICAL HISTORY: Abdominal/flank pain, stone suspected. FINDINGS: LOWER CHEST: There is patchy atelectasis and airspace disease in the right lower lobe. LIVER: The liver is unremarkable. GALLBLADDER AND BILE DUCTS: The gallbladder is surgically absent. No biliary ductal dilatation. SPLEEN: No acute abnormality. PANCREAS: No acute abnormality. ADRENAL GLANDS: No acute abnormality. KIDNEYS, URETERS AND BLADDER: There is a right  renal pelvis calculus measuring 1.8 cm without hydronephrosis. No perinephric or periureteral stranding. Mild diffuse bladder wall thickening versus normal under distention. GI AND BOWEL: Stomach demonstrates no acute abnormality. There is no bowel obstruction. Appendix appears normal. PERITONEUM AND RETROPERITONEUM: No ascites. No free air. VASCULATURE: Aorta is normal in caliber. There are atherosclerotic calcifications of the aorta. LYMPH NODES: Numerous nonenlarged retroperitoneal and iliac chain lymph nodes. REPRODUCTIVE ORGANS: No acute abnormality. BONES AND SOFT TISSUES: No acute osseous abnormality. Mild bilateral neck periarticular fat stranding which is nonspecific. Small fat-containing bilateral inguinal hernias. IMPRESSION: 1. Right renal pelvis calculus measuring 1.8 cm without hydronephrosis. 2. Mild diffuse bladder wall thickening versus normal underdistention. Correlate clinically for cystitis. 3. Patchy right lower lobe atelectasis/airspace disease. 4. Status post cholecystectomy. 5. Numerous nonenlarged retroperitoneal and iliac chain lymph nodes, likely reactive. Electronically signed by: Greig Pique MD 09/18/2024 11:49 PM EST RP  Workstation: [id number]   DG Chest 2 View Result Date: 09/18/2024 EXAM: 2 VIEW(S) XRAY OF THE CHEST 09/18/2024 08:58:13 PM COMPARISON: 5 / 4 / 23 CLINICAL HISTORY: cough, fever, malaise FINDINGS: LUNGS AND PLEURA: Low lung volumes. Retrocardiac atelectasis or pneumonia. No pulmonary edema. No pleural effusion. No pneumothorax. HEART AND MEDIASTINUM: Mild cardiomegaly. No acute abnormality of the mediastinal silhouette. BONES AND SOFT TISSUES: No acute osseous abnormality. IMPRESSION: 1. Hypoventilation with retrocardiac atelectasis or pneumonia. Electronically signed by: Norman Gatlin MD 09/18/2024 09:15 PM EST RP Workstation: [id number]     TODAY-DAY OF DISCHARGE:  Subjective:   Marcey Katz today has no headache,no chest abdominal pain,no new weakness tingling or numbness, feels much better wants to go home today.   Objective:   Blood pressure (!) 135/90, pulse 80, temperature 98 F (36.7 C), temperature source Oral, resp. rate 18, height 6' 2 (1.88 m), weight 103 kg, SpO2 92%.  Intake/Output Summary (Last 24 hours) at 09/22/2024 1002 Last data filed at 09/21/2024 2137 Gross per 24 hour  Intake 120 ml  Output 500 ml  Net -380 ml   Filed Weights   09/18/24 1959 09/19/24 1328  Weight: 103 kg 103 kg    Exam: Awake Alert, Oriented *3, No new F.N deficits, Normal affect [place].AT,PERRAL Supple Neck,No JVD, No cervical lymphadenopathy appriciated.  Symmetrical Chest wall movement, Good air movement bilaterally, CTAB RRR,No Gallops,Rubs or new Murmurs, No Parasternal Heave +ve B.Sounds, Abd Soft, Non tender, No organomegaly appriciated, No rebound -guarding or rigidity. No Cyanosis, Clubbing or edema, No new Rash or bruise   PERTINENT RADIOLOGIC STUDIES: No results found.   PERTINENT LAB RESULTS: CBC: Recent Labs    09/21/24 0326  WBC 7.9  HGB 11.6*  HCT 34.5*  PLT 316   CMET CMP     Component Value Date/Time   NA 138 09/22/2024 0331   NA 145 (H) 07/06/2024 1524    NA 141 03/10/2012 1336   K 3.3 (L) 09/22/2024 0331   K 4.3 03/10/2012 1336   CL 107 09/22/2024 0331   CL 108 (H) 03/10/2012 1336   CO2 23 09/22/2024 0331   CO2 27 03/10/2012 1336   GLUCOSE 91 09/22/2024 0331   GLUCOSE 84 03/10/2012 1336   BUN 11 09/22/2024 0331   BUN 16 07/06/2024 1524   BUN 13 03/10/2012 1336   CREATININE 0.78 09/22/2024 0331   CREATININE 0.92 04/28/2021 0000   CALCIUM  8.1 (L) 09/22/2024 0331   CALCIUM  9.5 03/10/2012 1336   PROT 6.6 09/18/2024 2112   PROT 7.1 07/06/2024 1524   PROT 8.2 03/10/2012 1336   ALBUMIN  3.1 (L) 09/18/2024 2112   ALBUMIN 4.5 07/06/2024 1524   ALBUMIN 3.8 03/10/2012 1336   AST 12 (L) 09/18/2024 2112   AST 19 03/10/2012 1336   ALT 11 09/18/2024 2112   ALT 14 02/28/2021 1117   ALKPHOS 64 09/18/2024 2112   ALKPHOS 80 03/10/2012 1336   BILITOT 0.9 09/18/2024 2112   BILITOT 0.5 07/06/2024 1524   BILITOT 0.3 03/10/2012 1336   GFR 70.92 02/27/2017 0918   EGFR 82 07/06/2024 1524   GFRNONAA >60 09/22/2024 0331   GFRNONAA >60 03/10/2012 1336    GFR Estimated Creatinine Clearance: 135.1 mL/min (by C-G formula based on SCr of 0.78 mg/dL). No results for input(s): LIPASE, AMYLASE in the last 72 hours. No results for input(s): CKTOTAL, CKMB, CKMBINDEX, TROPONINI in the last 72 hours. Invalid input(s): POCBNP No results for input(s): DDIMER in the last 72 hours. No results for input(s): HGBA1C in the last 72 hours. No results for input(s): CHOL, HDL, LDLCALC, TRIG, CHOLHDL, LDLDIRECT in the last 72 hours. No results for input(s): TSH, T4TOTAL, T3FREE, THYROIDAB in the last 72 hours.  Invalid input(s): FREET3 No results for input(s): VITAMINB12, FOLATE, FERRITIN, TIBC, IRON, RETICCTPCT in the last 72 hours. Coags: No results for input(s): INR in the last 72 hours.  Invalid input(s): PT Microbiology: Recent Results (from the past 240 hours)  Urine Culture     Status: Abnormal    Collection Time: 09/18/24  8:26 PM   Specimen: Urine, Clean Catch  Result Value Ref Range Status   Specimen Description URINE, CLEAN CATCH  Final   Special Requests NONE  Final   Culture (A)  Final    <10,000 COLONIES/mL INSIGNIFICANT GROWTH Performed at [facility], [address], [place], [place] [address]    Report Status 09/19/2024 FINAL  Final  Resp panel by RT-PCR (RSV, Flu A&B, Covid) Anterior Nasal Swab     Status: None   Collection Time: 09/18/24  9:12 PM   Specimen: Anterior Nasal Swab  Result Value Ref Range Status   SARS Coronavirus 2 by RT PCR NEGATIVE NEGATIVE Final   Influenza A by PCR NEGATIVE NEGATIVE Final   Influenza B by PCR NEGATIVE NEGATIVE Final    Comment: (NOTE) The Xpert Xpress SARS-CoV-2/FLU/RSV plus assay is intended as an aid in the diagnosis of influenza from Nasopharyngeal swab specimens and should not be used as a sole basis for treatment. Nasal washings and aspirates are unacceptable for Xpert Xpress SARS-CoV-2/FLU/RSV testing.  Fact Sheet for Patients: bloggercourse.com  Fact Sheet for Healthcare Providers: seriousbroker.it  This test is not yet approved or cleared by the [place]  FDA and has been authorized for detection and/or diagnosis of SARS-CoV-2 by FDA under an Emergency Use Authorization (EUA). This EUA will remain in effect (meaning this test can be used) for the duration of the COVID-19 declaration under Section 564(b)(1) of the Act, 21 U.S.C. section 360bbb-3(b)(1), unless the authorization is terminated or revoked.     Resp Syncytial Virus by PCR NEGATIVE NEGATIVE Final    Comment: (NOTE) Fact Sheet for Patients: bloggercourse.com  Fact Sheet for Healthcare Providers: seriousbroker.it  This test is not yet approved or cleared by the [place]  FDA and has been authorized for detection and/or diagnosis of  SARS-CoV-2 by FDA under an Emergency Use Authorization (EUA). This EUA will remain in effect (meaning this test can be used) for the duration of the COVID-19 declaration under Section 564(b)(1) of the Act, 21 U.S.C. section 360bbb-3(b)(1), unless the authorization  is terminated or revoked.  Performed at [facility], [address], [place], [place] [address]   Blood culture (routine x 2)     Status: Abnormal   Collection Time: 09/18/24  9:12 PM   Specimen: BLOOD LEFT ARM  Result Value Ref Range Status   Specimen Description BLOOD LEFT ARM  Final   Special Requests   Final    BOTTLES DRAWN AEROBIC AND ANAEROBIC Blood Culture adequate volume   Culture  Setup Time   Final    GRAM NEGATIVE RODS ANAEROBIC BOTTLE ONLY CRITICAL RESULT CALLED TO, READ BACK BY AND VERIFIED WITH: PHARMD JADE D. [id number] AT 1310, ADC Performed at [facility], [address], [place], [place] [address]    Culture ESCHERICHIA COLI (A)  Final   Report Status 09/21/2024 FINAL  Final   Organism ID, Bacteria ESCHERICHIA COLI  Final      Susceptibility   Escherichia coli - MIC*    AMPICILLIN >=32 RESISTANT Resistant     CEFAZOLIN (NON-URINE) 4 INTERMEDIATE Intermediate     CEFEPIME <=0.12 SENSITIVE Sensitive     ERTAPENEM <=0.12 SENSITIVE Sensitive     CEFTRIAXONE  <=0.25 SENSITIVE Sensitive     CIPROFLOXACIN <=0.06 SENSITIVE Sensitive     GENTAMICIN <=1 SENSITIVE Sensitive     MEROPENEM <=0.25 SENSITIVE Sensitive     TRIMETH/SULFA <=20 SENSITIVE Sensitive     AMPICILLIN/SULBACTAM 16 INTERMEDIATE Intermediate     PIP/TAZO Value in next row Sensitive      <=4 SENSITIVEThis is a modified FDA-approved test that has been validated and its performance characteristics determined by the reporting laboratory.  This laboratory is certified under the Clinical Laboratory Improvement Amendments CLIA as qualified to perform high complexity clinical laboratory testing.    * ESCHERICHIA COLI  Blood culture  (routine x 2)     Status: None (Preliminary result)   Collection Time: 09/18/24  9:12 PM   Specimen: BLOOD RIGHT ARM  Result Value Ref Range Status   Specimen Description BLOOD RIGHT ARM  Final   Special Requests   Final    BOTTLES DRAWN AEROBIC AND ANAEROBIC Blood Culture adequate volume   Culture  Setup Time   Final    GRAM NEGATIVE RODS AEROBIC BOTTLE ONLY CRITICAL VALUE NOTED.  VALUE IS CONSISTENT WITH PREVIOUSLY REPORTED AND CALLED VALUE.    Culture   Final    GRAM NEGATIVE RODS IDENTIFICATION TO FOLLOW Performed at [facility], [address], [place], [place] [address]    Report Status PENDING  Incomplete  Blood Culture ID Panel (Reflexed)     Status: Abnormal   Collection Time: 09/18/24  9:12 PM  Result Value Ref Range Status   Enterococcus faecalis NOT DETECTED NOT DETECTED Final   Enterococcus Faecium NOT DETECTED NOT DETECTED Final   Listeria monocytogenes NOT DETECTED NOT DETECTED Final   Staphylococcus species NOT DETECTED NOT DETECTED Final   Staphylococcus aureus (BCID) NOT DETECTED NOT DETECTED Final   Staphylococcus epidermidis NOT DETECTED NOT DETECTED Final   Staphylococcus lugdunensis NOT DETECTED NOT DETECTED Final   Streptococcus species NOT DETECTED NOT DETECTED Final   Streptococcus agalactiae NOT DETECTED NOT DETECTED Final   Streptococcus pneumoniae NOT DETECTED NOT DETECTED Final   Streptococcus pyogenes NOT DETECTED NOT DETECTED Final   A.calcoaceticus-baumannii NOT DETECTED NOT DETECTED Final   Bacteroides fragilis NOT DETECTED NOT DETECTED Final   Enterobacterales DETECTED (A) NOT DETECTED Final    Comment: Enterobacterales represent a large order of gram  negative bacteria, not a single organism. CRITICAL RESULT CALLED TO, READ BACK BY AND VERIFIED WITH: PHARMD JADE D. [id number] AT 1310, ADC    Enterobacter cloacae complex NOT DETECTED NOT DETECTED Final   Escherichia coli DETECTED (A) NOT DETECTED Final    Comment: CRITICAL RESULT CALLED TO,  READ BACK BY AND VERIFIED WITH: PHARMD JADE D. [id number] AT 1310, ADC    Klebsiella aerogenes NOT DETECTED NOT DETECTED Final   Klebsiella oxytoca NOT DETECTED NOT DETECTED Final   Klebsiella pneumoniae NOT DETECTED NOT DETECTED Final   Proteus species NOT DETECTED NOT DETECTED Final   Salmonella species NOT DETECTED NOT DETECTED Final   Serratia marcescens NOT DETECTED NOT DETECTED Final   Haemophilus influenzae NOT DETECTED NOT DETECTED Final   Neisseria meningitidis NOT DETECTED NOT DETECTED Final   Pseudomonas aeruginosa NOT DETECTED NOT DETECTED Final   Stenotrophomonas maltophilia NOT DETECTED NOT DETECTED Final   Candida albicans NOT DETECTED NOT DETECTED Final   Candida auris NOT DETECTED NOT DETECTED Final   Candida glabrata NOT DETECTED NOT DETECTED Final   Candida krusei NOT DETECTED NOT DETECTED Final   Candida parapsilosis NOT DETECTED NOT DETECTED Final   Candida tropicalis NOT DETECTED NOT DETECTED Final   Cryptococcus neoformans/gattii NOT DETECTED NOT DETECTED Final   CTX-M ESBL NOT DETECTED NOT DETECTED Final   Carbapenem resistance IMP NOT DETECTED NOT DETECTED Final   Carbapenem resistance KPC NOT DETECTED NOT DETECTED Final   Carbapenem resistance NDM NOT DETECTED NOT DETECTED Final   Carbapenem resist OXA 48 LIKE NOT DETECTED NOT DETECTED Final   Carbapenem resistance VIM NOT DETECTED NOT DETECTED Final    Comment: Performed at [facility], [address], [place], [place] [address]    FURTHER DISCHARGE INSTRUCTIONS:  Get Medicines reviewed and adjusted: Please take all your medications with you for your next visit with your Primary MD  Laboratory/radiological data: Please request your Primary MD to go over all hospital tests and procedure/radiological results at the follow up, please ask your Primary MD to get all Hospital records sent to his/her office.  In some cases, they will be blood work, cultures and biopsy results pending at the time of your  discharge. Please request that your primary care M.D. goes through all the records of your hospital data and follows up on these results.  Also Note the following: If you experience worsening of your admission symptoms, develop shortness of breath, life threatening emergency, suicidal or homicidal thoughts you must seek medical attention immediately by calling 911 or calling your MD immediately  if symptoms less severe.  You must read complete instructions/literature along with all the possible adverse reactions/side effects for all the Medicines you take and that have been prescribed to you. Take any new Medicines after you have completely understood and accpet all the possible adverse reactions/side effects.   Do not drive when taking Pain medications or sleeping medications (Benzodaizepines)  Do not take more than prescribed Pain, Sleep and Anxiety Medications. It is not advisable to combine anxiety,sleep and pain medications without talking with your primary care practitioner  Special Instructions: If you have smoked or chewed Tobacco  in the last 2 yrs please stop smoking, stop any regular Alcohol  and or any Recreational drug use.  Wear Seat belts while driving.  Please note: You were cared for by a hospitalist during your hospital stay. Once you are discharged, your primary care physician will handle any further medical issues. Please note that NO REFILLS  for any discharge medications will be authorized once you are discharged, as it is imperative that you return to your primary care physician (or establish a relationship with a primary care physician if you do not have one) for your post hospital discharge needs so that they can reassess your need for medications and monitor your lab values.  Total Time spent coordinating discharge including counseling, education and face to face time equals greater than 30 minutes.  Signed: Hasset Chaviano 09/22/2024 10:02 AM

## 2024-09-22 NOTE — TOC Transition Note (Signed)
 Transition of Care [facility]) - Discharge Note   Patient Details  Name: Jason Romero MRN: [id number] Date of Birth: [birth year]  Transition of Care [facility]) CM/SW Contact:  Marval Gell, RN Phone Number: 09/22/2024, 9:22 AM   Clinical Narrative:     Beat[place] w wife re DC plan. Unable to find [facility] agency due to payor source. WC to be delivered to house per yesterday's note. Verified address.  Spoe w RN and PTAR called.    Final next level of care: Home/Self Care Barriers to Discharge: No Barriers Identified   Patient Goals and CMS Choice Patient states their goals for this hospitalization and ongoing recovery are:: Return home CMS Medicare.gov Compare Post Acute Care list provided to:: Patient Choice offered to / list presented to : Patient, Spouse [place] ownership interest in [facility].provided to:: Spouse    Discharge Placement                       Discharge Plan and Services Additional resources added to the After Visit Summary for   In-house Referral: Clinical Social Work Discharge Planning Services: CM Consult Post Acute Care Choice: Durable Medical Equipment          DME Arranged: Wheelchair manual DME Agency: [employer] Date DME Agency Contacted: 09/21/24 Time DME Agency Contacted: [phone] Representative spoke with at DME Agency: [facility] Budge HH Arranged: NA HH Agency: NA        Social Drivers of Health (SDOH) Interventions SDOH Screenings   Food Insecurity: No Food Insecurity (09/19/2024)  Housing: Low Risk  (09/19/2024)  Transportation Needs: No Transportation Needs (09/19/2024)  Utilities: Not At Risk (09/19/2024)  Alcohol Screen: Low Risk  (11/04/2023)  Depression (PHQ2-9): Low Risk  (07/06/2024)  Financial Resource Strain: Medium Risk (11/08/2021)  Physical Activity: Inactive (11/04/2023)  Social Connections: Moderately Isolated (11/04/2023)  Stress: No Stress Concern Present (11/04/2023)  Tobacco Use: High Risk (09/19/2024)   Health Literacy: Inadequate Health Literacy (11/04/2023)     Readmission Risk Interventions     No data to display

## 2024-09-23 ENCOUNTER — Telehealth: Payer: Self-pay

## 2024-09-23 DIAGNOSIS — I6930 Unspecified sequelae of cerebral infarction: Secondary | ICD-10-CM | POA: Diagnosis not present

## 2024-09-23 LAB — CULTURE, BLOOD (ROUTINE X 2)
Culture  Setup Time: NO GROWTH
Special Requests: ADEQUATE

## 2024-09-23 NOTE — Telephone Encounter (Signed)
 Looks like stent was placed so pain should not be an issue.

## 2024-09-23 NOTE — Telephone Encounter (Signed)
 Dr Vicci - FYI   His wife said that she has been giving him tylenol  for the right flank pain but that has not provided much relief of the pain. She would like to know if something stronger could be prescribed. I told her that I would let Dr Vicci know her concern. I also told her that she can check with urology.

## 2024-09-23 NOTE — Telephone Encounter (Signed)
 I spoke to patient's wife, [name], and shared Dr Ferdie response regarding the pain medication. She said she understood and said  that's fine.  I told her that she can always contact urology about any concerns with the stent and/ or on-going pain and she said okay.  She was appreciative of the follow up.

## 2024-09-23 NOTE — Transitions of Care (Post Inpatient/ED Visit) (Signed)
 09/23/2024  Name: Jason Romero MRN: [id number] DOB: 09-19-1971  Today's TOC FU Call Status: Today's TOC FU Call Status:: Successful TOC FU Call Completed TOC FU Call Complete Date: 09/23/24  Patient's Name and Date of Birth confirmed. Name, DOB  Transition Care Management Follow-up Telephone Call Date of Discharge: 09/22/24 Discharge Facility: [facility]) Type of Discharge: Inpatient Admission Primary Inpatient Discharge Diagnosis:: acute cystitis with hematuria How have you been since you were released from the hospital?: Better (call completed with patient's wife, Jason Romero) Any questions or concerns?: Yes Patient Questions/Concerns:: She said that she has been giving him tylenol   for the right flank pain but that has not provided much relief of the pain.  She would like to know if something stronger could be prescribed. I told her that I would let Dr Vicci know her concern.  I also told her that she can check with urology. Patient Questions/Concerns Addressed: Notified Provider of Patient Questions/Concerns  Items Reviewed: Did you receive and understand the discharge instructions provided?: Yes Medications obtained,verified, and reconciled?: Yes (Medications Reviewed) Jason Romero reports that he has all of his medications and she understands he is to hold the amlodipine  and hydralazine  until    a provider instructs him to resume taking it.) Any new allergies since your discharge?: No Dietary orders reviewed?: Yes Type of Diet Ordered:: heart healthy, low sodium Do you have support at home?: Yes People in Home [RPT]: spouse Name of Support/Comfort Primary Source: Jason Romero  Medications Reviewed Today: Medications Reviewed Today     Reviewed by Marvis Bradley, RN (Case Manager) on 09/23/24 at 1406  Med List Status: <None>   Medication Order Taking? Sig Documenting Provider Last Dose Status Informant  acetaminophen  (TYLENOL ) 325 MG tablet [id number] No Take 2 tablets  (650 mg total) by mouth every 4 (four) hours as needed for mild pain (or temp > 37.5 C (99.5 F)). Pegge Toribio PARAS, PA-C 09/18/2024 Evening Active Spouse/Significant Other, Other, Pharmacy Records  albuterol  (VENTOLIN  HFA) 108 (90 Base) MCG/ACT inhaler [id number] No Inhale 2 puffs into the lungs every 6 (six) hours as needed for wheezing or shortness of breath. Vicci Barnie NOVAK, MD 09/18/2024 Noon Active Spouse/Significant Other, Other, Pharmacy Records  amLODipine  (NORVASC ) 10 MG tablet [id number] No Take 1 tablet (10 mg total) by mouth daily. Vicci Barnie NOVAK, MD 09/18/2024 Morning Active Spouse/Significant Other, Other, Pharmacy Records  aspirin  EC 81 MG tablet [id number] No Take 1 tablet (81 mg total) by mouth daily. Swallow whole. Brien Belvie BRAVO, MD 09/18/2024 Morning Active Spouse/Significant Other, Other, Pharmacy Records  atorvastatin  (LIPITOR) 20 MG tablet [id number] No Take 1 tablet (20 mg total) by mouth daily. Vicci Barnie NOVAK, MD 09/17/2024 Evening Active Spouse/Significant Other, Other, Pharmacy Records  baclofen  (LIORESAL ) 20 MG tablet [id number] No Take 1 tablet (20 mg total) by mouth 3 (three) times daily. Vicci Barnie NOVAK, MD 09/18/2024 Evening Active Spouse/Significant Other, Other, Pharmacy Records  Blood Pressure Monitoring (BLOOD PRESSURE KIT) DEVI [id number]  Use to monitor blood pressure Brien Belvie BRAVO, MD  Active Spouse/Significant Other, Other, Pharmacy Records  ciprofloxacin (CIPRO) 500 MG tablet [id number]  Take 1 tablet (500 mg total) by mouth 2 (two) times daily. Raenelle Donalda HERO, MD  Active   hydrALAZINE  (APRESOLINE ) 50 MG tablet [id number] No Take 1 tablet (50 mg total) by mouth in the morning and at bedtime. Vicci Barnie NOVAK, MD 09/18/2024 Morning Active Spouse/Significant Other, Other, Pharmacy Records  hydrOXYzine  (VISTARIL ) 50 MG capsule [id number] No Take 1 capsule (50 mg  total) by mouth every 8 (eight) hours as needed for anxiety. Vicci Barnie NOVAK, MD 09/18/2024  Evening Active Spouse/Significant Other, Other, Pharmacy Records  labetalol  (NORMODYNE ) 300 MG tablet [id number] No Take 1 tablet (300 mg total) by mouth 2 (two) times daily. Vicci Barnie NOVAK, MD 09/18/2024 Morning Active Spouse/Significant Other, Other, Pharmacy Records  pregabalin  (LYRICA ) 150 MG capsule [id number] No Take 1 capsule (150 mg total) by mouth 2 (two) times daily. Vicci Barnie NOVAK, MD 09/18/2024 Morning Active Spouse/Significant Other, Other, Pharmacy Records  traZODone  (DESYREL ) 100 MG tablet [id number] No Take 1.5 tablets (150 mg total) by mouth at bedtime as needed for sleep. Vicci Barnie NOVAK, MD 09/17/2024 Bedtime Active Spouse/Significant Other, Other, Pharmacy Records            Home Care and Equipment/Supplies: Were Home Health Services Ordered?: No Any new equipment or medical supplies ordered?: Yes Name of Medical supply agency?: [employer]- wheelchair Were you able to get the equipment/medical supplies?: No Jason Romero is aware that it is to be delivered to their home.  I gave her the number for [employer] : [phone] to call and check the status of delivery.) Do you have any questions related to the use of the equipment/supplies?: No  Functional Questionnaire: Do you need assistance with bathing/showering or dressing?: Yes Jason Romero provides all needed assistance with ADLs. He also receives PCS 3 hours/day x 5 days/week) Do you need assistance with meal preparation?: Yes Do you need assistance with eating?: No Do you have difficulty maintaining continence: Yes Do you need assistance with getting out of bed/getting out of a chair/moving?: Yes Do you have difficulty managing or taking your medications?: Yes  Follow up appointments reviewed: PCP Follow-up appointment confirmed?: Yes Date of PCP follow-up appointment?: 10/13/24 Follow-up Provider: Dr [facility] Follow-up appointment confirmed?: No Reason Specialist Follow-Up Not Confirmed: Patient has  Specialist Provider Number and will Call for Appointment (I gave Jason Romero the number for [facility] to call and schedule follow up.  She said the doctor in the hospital told her they would be removing the stent in about 4 weeks.) Do you need transportation to your follow-up appointment?: No Do you understand care options if your condition(s) worsen?: Yes-patient verbalized understanding    SIGNATURE Slater Diesel, RN

## 2024-09-24 ENCOUNTER — Other Ambulatory Visit: Payer: Self-pay | Admitting: Urology

## 2024-09-25 DIAGNOSIS — R531 Weakness: Secondary | ICD-10-CM | POA: Diagnosis not present

## 2024-09-29 NOTE — Anesthesia Postprocedure Evaluation (Signed)
 Anesthesia Post Note  Patient: Jason Romero  Procedure(s) Performed: CYSTOSCOPY, WITH STENT INSERTION (Left)     Patient location during evaluation: PACU Anesthesia Type: General Level of consciousness: awake and alert Pain management: pain level controlled Vital Signs Assessment: post-procedure vital signs reviewed and stable Respiratory status: spontaneous breathing, nonlabored ventilation and respiratory function stable Cardiovascular status: blood pressure returned to baseline and stable Postop Assessment: no apparent nausea or vomiting Anesthetic complications: no   No notable events documented.                 Majesty Stehlin

## 2024-10-01 NOTE — Patient Instructions (Addendum)
 SURGICAL WAITING ROOM VISITATION  Patients having surgery or a procedure may have no more than 2 support people in the waiting area - these visitors may rotate.    Children under the age of 44 must have an adult with them who is not the patient.  Visitors with respiratory illnesses are discouraged from visiting and should remain at home.  If the patient needs to stay at the hospital during part of their recovery, the visitor guidelines for inpatient rooms apply. Pre-op nurse will coordinate an appropriate time for 1 support person to accompany patient in pre-op.  This support person may not rotate.    Please refer to the [facility] website for the visitor guidelines for Inpatients (after your surgery is over and you are in a regular room).       Your procedure is scheduled on: 10/07/24   [facility]    Report to admitting at 8:30 AM   Call this number if you have problems the morning of surgery [phone]   Do not eat food or drink liquids :After Midnight.but may have sips of water  to take meds.     Oral Hygiene is also important to reduce your risk of infection.                                    Remember - BRUSH YOUR TEETH THE MORNING OF SURGERY WITH YOUR REGULAR TOOTHPASTE   Do NOT smoke after Midnight   Stop all vitamins and herbal supplements 7 days before surgery.   Take these medicines the morning of surgery with A SIP OF WATER : tylenol  if needed, atorvastatin , cipro , hydroxyzine , labetalol , pregabalin , inhaler             You may not have any metal on your body including hair pins, jewelry, and body piercing             Do not wear make-up, lotions, powders, perfumes/cologne, or deodorant              Men may shave face and neck.   Do not bring valuables to the hospital. [place] IS NOT             RESPONSIBLE   FOR VALUABLES.   Contacts, glasses, dentures or bridgework may not be worn into surgery.    DO NOT BRING YOUR HOME  MEDICATIONS TO THE HOSPITAL. PHARMACY WILL DISPENSE MEDICATIONS LISTED ON YOUR MEDICATION LIST TO YOU DURING YOUR ADMISSION IN THE HOSPITAL!    Patients discharged on the day of surgery will not be allowed to drive home.  Someone NEEDS to stay with you for the first 24 hours after anesthesia.   Special Instructions: Bring a copy of your healthcare power of attorney and living will documents the day of surgery if you haven't scanned them before.              Please read over the following fact sheets you were given: IF YOU HAVE QUESTIONS ABOUT YOUR PRE-OP INSTRUCTIONS PLEASE CALL [phone] Jason Romero   If you received a COVID test during your pre-op visit  it is requested that you wear a mask when out in public, stay away from anyone that may not be feeling well and notify your surgeon if you develop symptoms. If you test positive for Covid or have been in contact with anyone that has tested positive in the last 10  days please notify you surgeon.    Jason Romero - Preparing for Surgery Before surgery, you can play an important role.  Because skin is not sterile, your skin needs to be as free of germs as possible.  You can reduce the number of germs on your skin by washing with CHG (chlorahexidine gluconate) soap before surgery.  CHG is an antiseptic cleaner which kills germs and bonds with the skin to continue killing germs even after washing. Please DO NOT use if you have an allergy to CHG or antibacterial soaps.  If your skin becomes reddened/irritated stop using the CHG and inform your nurse when you arrive at Short Stay. Do not shave (including legs and underarms) for at least 48 hours prior to the first CHG shower.  You may shave your face/neck.  Please follow these instructions carefully:  1.  Shower with CHG Soap the night before surgery and the morning of surgery.  2.  If you choose to wash your hair, wash your hair first as usual with your normal  shampoo.  3.  After you shampoo, rinse your  hair and body thoroughly to remove the shampoo.                             4.  Use CHG as you would any other liquid soap.  You can apply chg directly to the skin and wash.  Gently with a scrungie or clean washcloth.  5.  Apply the CHG Soap to your body ONLY FROM THE NECK DOWN.   Do   not use on face/ open                           Wound or open sores. Avoid contact with eyes, ears mouth and   genitals (private parts).                       Wash face,  Genitals (private parts) with your normal soap.             6.  Wash thoroughly, paying special attention to the area where your    surgery  will be performed.  7.  Thoroughly rinse your body with warm water  from the neck down.  8.  DO NOT shower/wash with your normal soap after using and rinsing off the CHG Soap.                9.  Pat yourself dry with a clean towel.            10.  Wear clean pajamas.            11.  Place clean sheets on your bed the night of your first shower and do not  sleep with pets. Day of Surgery : Do not apply any CHG, lotions/deodorants the morning of surgery.  Please wear clean clothes to the hospital/surgery center.  FAILURE TO FOLLOW THESE INSTRUCTIONS MAY RESULT IN THE CANCELLATION OF YOUR SURGERY  PATIENT SIGNATURE_________________________________  NURSE SIGNATURE__________________________________  ________________________________________________________________________

## 2024-10-01 NOTE — Progress Notes (Addendum)
 COVID Vaccine received:  []  No [x]  Yes Date of any COVID positive Test in last 90 days: no PCP - Barnie Louder MD Cardiologist - n/a  Chest x-ray - 09/18/24 Epic EKG -  09/18/24 Stress Test -  ECHO - 09/11/20 Epic Cardiac Cath -   Bowel Prep - [x]  No  []   Yes ______  Pacemaker / ICD device [x]  No []  Yes   Spinal Cord Stimulator:[x]  No []  Yes       History of Sleep Apnea? [x]  No []  Yes   CPAP used?- [x]  No []  Yes    Does the patient monitor blood sugar?          [x]  No []  Yes  []  N/A  Patient has: [x]  NO Hx DM   []  Pre-DM                 []  DM1  []   DM2 Does patient have a [employer] or Dexacom? []  No []  Yes   Fasting Blood Sugar Ranges-  Checks Blood Sugar _____ times a day  GLP1 agonist / usual dose - no GLP1 instructions:  SGLT-2 inhibitors / usual dose - no SGLT-2 instructions:   Blood Thinner / Instructions:no Aspirin  Instructions:ASA 81 mg  last dose was 10/02/24. Pt. Aware to hold till after surgery.  Comments:   Activity level: Patient is unable / to climb a flight of stairs without difficulty; [x]  No CP  [x]  No SOB, but would have _dysfunctional legs due to stroke__Wheelchair bound.   Patient can not perform ADLs without assistance.   Anesthesia review: A-fib, not on blood thinner except for ASA. COPD, smoker, HTN, MI, Stroke w/ hemiplegia  Patient denies shortness of breath, fever, cough and chest pain at PAT appointment.  Patient verbalized understanding and agreement to the Pre-Surgical Instructions that were given to them at this PAT appointment. Patient was also educated of the need to review these PAT instructions again prior to his/her surgery.I reviewed the appropriate phone numbers to call if they have any and questions or concerns.

## 2024-10-02 ENCOUNTER — Encounter (HOSPITAL_COMMUNITY)
Admission: RE | Admit: 2024-10-02 | Discharge: 2024-10-02 | Disposition: A | Discharge status: Home / Self Care | Source: Ambulatory Visit | Attending: Urology | Admitting: Urology

## 2024-10-02 ENCOUNTER — Encounter (HOSPITAL_COMMUNITY): Payer: Self-pay | Admitting: Medical

## 2024-10-02 ENCOUNTER — Other Ambulatory Visit: Payer: Self-pay

## 2024-10-02 ENCOUNTER — Encounter (HOSPITAL_COMMUNITY): Payer: Self-pay

## 2024-10-02 DIAGNOSIS — D649 Anemia, unspecified: Secondary | ICD-10-CM | POA: Insufficient documentation

## 2024-10-02 DIAGNOSIS — I252 Old myocardial infarction: Secondary | ICD-10-CM | POA: Insufficient documentation

## 2024-10-02 DIAGNOSIS — N201 Calculus of ureter: Secondary | ICD-10-CM | POA: Insufficient documentation

## 2024-10-02 DIAGNOSIS — G4733 Obstructive sleep apnea (adult) (pediatric): Secondary | ICD-10-CM | POA: Diagnosis not present

## 2024-10-02 DIAGNOSIS — Z01818 Encounter for other preprocedural examination: Secondary | ICD-10-CM | POA: Insufficient documentation

## 2024-10-02 DIAGNOSIS — J449 Chronic obstructive pulmonary disease, unspecified: Secondary | ICD-10-CM | POA: Insufficient documentation

## 2024-10-02 DIAGNOSIS — I2510 Atherosclerotic heart disease of native coronary artery without angina pectoris: Secondary | ICD-10-CM | POA: Diagnosis not present

## 2024-10-02 DIAGNOSIS — I110 Hypertensive heart disease with heart failure: Secondary | ICD-10-CM | POA: Diagnosis not present

## 2024-10-02 DIAGNOSIS — I4891 Unspecified atrial fibrillation: Secondary | ICD-10-CM | POA: Diagnosis not present

## 2024-10-02 DIAGNOSIS — F17210 Nicotine dependence, cigarettes, uncomplicated: Secondary | ICD-10-CM | POA: Insufficient documentation

## 2024-10-02 DIAGNOSIS — I69351 Hemiplegia and hemiparesis following cerebral infarction affecting right dominant side: Secondary | ICD-10-CM | POA: Diagnosis not present

## 2024-10-02 DIAGNOSIS — I5032 Chronic diastolic (congestive) heart failure: Secondary | ICD-10-CM | POA: Diagnosis not present

## 2024-10-02 DIAGNOSIS — K219 Gastro-esophageal reflux disease without esophagitis: Secondary | ICD-10-CM | POA: Diagnosis not present

## 2024-10-02 DIAGNOSIS — Z87442 Personal history of urinary calculi: Secondary | ICD-10-CM | POA: Diagnosis not present

## 2024-10-02 DIAGNOSIS — R519 Headache, unspecified: Secondary | ICD-10-CM | POA: Diagnosis not present

## 2024-10-02 HISTORY — DX: Cardiac arrhythmia, unspecified: I49.9

## 2024-10-02 HISTORY — DX: Personal history of urinary calculi: Z87.442

## 2024-10-02 HISTORY — DX: Obstructive sleep apnea (adult) (pediatric): G47.33

## 2024-10-05 ENCOUNTER — Encounter (HOSPITAL_COMMUNITY): Payer: Self-pay

## 2024-10-05 NOTE — Progress Notes (Addendum)
 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  Patient admitted from 11/7-11/11 for sepsis and bacteremia 2/2 obstructing kidney stone. He went to the OR on 11/8 for stent placement with Urology and clinically improved. Hydralazine  and Amlodipine  were discontinued. Patient also noted to have possible PNA in RLL on CT Renal.  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 On pregabalin /baclofen . He mobilizes with a wheelchair.  Seen by PCP on 07/06/24. BP controlled.   Patient with hx of MI, nonobstructive CAD, and A.fib per notes. No cardiology notes available. Notes indicate that he is off anticoagulation due to hx of ICH. There is record that patient last had a cardiac cath ~2015 and he did not require any intervention. Stress testing done in 01/2016 was normal.  Patient denies CP/SOB at PAT visit. O2 sats 100%. Anticipate he can proceed if stable DOS.  VS: BP 114/72   Pulse 73   Temp 36.7 C (Oral)   Resp 16   Ht 6' 2 (1.88 m)   Wt 90.7 kg Comment: unable to weigh d/t wheelchair bound.  SpO2 100%   BMI 25.68 kg/m   PROVIDERS: Jason Barnie NOVAK, MD   LABS: Labs reviewed: Acceptable for surgery. (all labs ordered are listed, but only abnormal results are displayed)  Labs Reviewed - No data to display  CT Renal 09/18/24:  IMPRESSION: 1. Right renal pelvis calculus measuring  1.8 cm without hydronephrosis. 2. Mild diffuse bladder wall thickening versus normal underdistention. Correlate clinically for cystitis. 3. Patchy right lower lobe atelectasis/airspace disease. 4. Status post cholecystectomy. 5. Numerous nonenlarged retroperitoneal and iliac chain lymph nodes, likely reactive.  EKG 09/18/2024:  Atrial fibrillation Borderline repolarization abnormality Baseline wander in lead(s) II III aVF V3  Echo 09/11/20:  IMPRESSIONS     1. Left ventricular ejection fraction, by estimation, is 50 to 55%. The  left ventricle has low normal function. The left ventricle has no regional  wall motion abnormalities. There is mild left ventricular hypertrophy.  Left ventricular diastolic  parameters are consistent with Grade II diastolic dysfunction  (pseudonormalization).   2. Right ventricular systolic function is normal. The right ventricular  size is normal.   3. Left atrial size was mildly dilated.   4. The mitral valve is normal in structure. Mild to moderate mitral valve  regurgitation. No evidence of mitral stenosis.   5. The aortic valve is normal in structure. Aortic valve regurgitation is  not visualized. No aortic stenosis is present.   6. The inferior vena cava is normal in size with greater than 50%  respiratory variability, suggesting right atrial pressure of 3 mmHg.   Conclusion(s)/Recommendation(s): No intracardiac source of embolism  detected on this transthoracic study. A transesophageal echocardiogram is  recommended to exclude cardiac source of embolism if clinically indicated.   Stress test 02/02/2016 [facility] Med):  Summary: 1. Normal regadenoson  myocardial perfusion  with Tc-46m sestamibi imaging. 2. Global left ventricular systolic function was normal, with an EF of 53%.  Past Medical History:  Diagnosis Date   A-fib [facility])    Bilateral impacted cerumen 05/08/2022   Chronic headaches    COPD (chronic obstructive pulmonary disease)  (HCC)    Dysrhythmia    GERD (gastroesophageal reflux disease)    History of blood transfusion    History of kidney stones    Hypertension    MI (myocardial infarction) (HCC)    Murmur 02/12/2021   Seizures (HCC)    Stroke [facility])     Past Surgical History:  Procedure Laterality Date   CHOLECYSTECTOMY     CYSTOSCOPY WITH STENT PLACEMENT Left 09/19/2024   Procedure: CYSTOSCOPY, WITH STENT INSERTION;  Surgeon: Jason Romero Jason DOUGLAS, MD;  Location: [facility] OR;  Service: Urology;  Laterality: Left;   ELBOW SURGERY Left     MEDICATIONS:  acetaminophen  (TYLENOL ) 325 MG tablet   albuterol  (VENTOLIN  HFA) 108 (90 Base) MCG/ACT inhaler   [Paused] amLODipine  (NORVASC ) 10 MG tablet   aspirin  EC 81 MG tablet   atorvastatin  (LIPITOR) 20 MG tablet   baclofen  (LIORESAL ) 20 MG tablet   Blood Pressure Monitoring (BLOOD PRESSURE KIT) DEVI   ciprofloxacin  (CIPRO ) 500 MG tablet   [Paused] hydrALAZINE  (APRESOLINE ) 50 MG tablet   hydrOXYzine  (VISTARIL ) 50 MG capsule   labetalol  (NORMODYNE ) 300 MG tablet   pregabalin  (LYRICA ) 150 MG capsule   traZODone  (DESYREL ) 100 MG tablet   No current facility-administered medications for this encounter.   [name] Jason Romero [facility] Short Stay/Anesthesiology [facility] Phone [phone] 10/05/2024 10:04 AM

## 2024-10-07 ENCOUNTER — Ambulatory Visit (HOSPITAL_COMMUNITY): Admission: RE | Admit: 2024-10-07 | Admitting: Urology

## 2024-10-07 ENCOUNTER — Encounter (HOSPITAL_COMMUNITY): Admission: RE | Payer: Self-pay | Source: Home / Self Care

## 2024-10-07 ENCOUNTER — Encounter (HOSPITAL_COMMUNITY): Payer: Self-pay | Admitting: Certified Registered"

## 2024-10-07 ENCOUNTER — Ambulatory Visit (HOSPITAL_COMMUNITY): Admission: RE | Admit: 2024-10-07 | Source: Home / Self Care | Admitting: Urology

## 2024-10-07 SURGERY — CYSTOSCOPY/URETEROSCOPY/HOLMIUM LASER/STENT PLACEMENT
Anesthesia: General

## 2024-10-12 ENCOUNTER — Other Ambulatory Visit (HOSPITAL_COMMUNITY): Payer: Self-pay

## 2024-10-12 DIAGNOSIS — I6930 Unspecified sequelae of cerebral infarction: Secondary | ICD-10-CM | POA: Diagnosis not present

## 2024-10-13 ENCOUNTER — Ambulatory Visit: Admitting: Internal Medicine

## 2024-10-13 ENCOUNTER — Telehealth: Payer: Self-pay

## 2024-10-13 ENCOUNTER — Other Ambulatory Visit: Payer: Self-pay

## 2024-10-13 DIAGNOSIS — I6930 Unspecified sequelae of cerebral infarction: Secondary | ICD-10-CM | POA: Diagnosis not present

## 2024-10-13 NOTE — Telephone Encounter (Unsigned)
 Copied from CRM #[id number]. Topic: Clinical - Medication Question >> Oct 13, 2024 12:59 PM [name] wrote: Reason for CRM: Jason Romero called with Medication questions, requesting to speak to a nurse.   Best contact: [phone]

## 2024-10-14 ENCOUNTER — Other Ambulatory Visit (HOSPITAL_COMMUNITY): Payer: Self-pay

## 2024-10-14 ENCOUNTER — Other Ambulatory Visit: Payer: Self-pay | Admitting: Urology

## 2024-10-14 NOTE — Telephone Encounter (Signed)
 Patient wife call to  voiced that patient was told to stop hydrOXYzine  (VISTARIL ) and Amitriptyline and to restart the would need to speak with his PCP. Patient wife wanted to know what the above medication is for she thought they were his BP medication. Advised that neither medication is for his BP. Medication education provided. Patient has an appointment 11/10/2024.

## 2024-10-15 DIAGNOSIS — I6930 Unspecified sequelae of cerebral infarction: Secondary | ICD-10-CM | POA: Diagnosis not present

## 2024-10-16 DIAGNOSIS — I6930 Unspecified sequelae of cerebral infarction: Secondary | ICD-10-CM | POA: Diagnosis not present

## 2024-10-18 DIAGNOSIS — R8290 Unspecified abnormal findings in urine: Secondary | ICD-10-CM | POA: Diagnosis not present

## 2024-10-18 DIAGNOSIS — G8191 Hemiplegia, unspecified affecting right dominant side: Secondary | ICD-10-CM | POA: Diagnosis not present

## 2024-10-18 DIAGNOSIS — R32 Unspecified urinary incontinence: Secondary | ICD-10-CM | POA: Diagnosis not present

## 2024-10-21 DIAGNOSIS — I6930 Unspecified sequelae of cerebral infarction: Secondary | ICD-10-CM | POA: Diagnosis not present

## 2024-10-22 DIAGNOSIS — I6930 Unspecified sequelae of cerebral infarction: Secondary | ICD-10-CM | POA: Diagnosis not present

## 2024-10-23 DIAGNOSIS — I6930 Unspecified sequelae of cerebral infarction: Secondary | ICD-10-CM | POA: Diagnosis not present

## 2024-10-26 DIAGNOSIS — I6930 Unspecified sequelae of cerebral infarction: Secondary | ICD-10-CM | POA: Diagnosis not present

## 2024-10-29 NOTE — Progress Notes (Signed)
 COVID Vaccine received:  []  No [x]  Yes Date of any COVID positive Test in last 90 days: no PCP - Barnie Louder MD Cardiologist -   Chest x-ray - 09/18/24 Epic EKG -  09/18/24 Epic Stress Test -  ECHO - 09/11/20 Epic Cardiac Cath -   Bowel Prep - [x]  No  []   Yes ______  Pacemaker / ICD device [x]  No []  Yes   Spinal Cord Stimulator:[x]  No []  Yes       History of Sleep Apnea? [x]  No []  Yes   CPAP used?- [x]  No []  Yes    Does the patient monitor blood sugar?          [x]  No []  Yes  []  N/A  Patient has: [x]  NO Hx DM   []  Pre-DM                 []  DM1  []   DM2 Does patient have a [employer] or Dexacom? []  No []  Yes   Fasting Blood Sugar Ranges-  Checks Blood Sugar _____ times a day  GLP1 agonist / usual dose - no GLP1 instructions:  SGLT-2 inhibitors / usual dose - no SGLT-2 instructions:   Blood Thinner / Instructions:no Aspirin  Instructions:ASA 81 mg Stop 5 days prior to surgery.Last dose was 10/30/24.  Comments:   Activity level: Patient is  unable to climb a flight of stairs without difficulty; [x]  No CP  [x]  No SOB, hemiplegia  Patient can not perform ADLs without assistance.  Is wheelchair bound.  Anesthesia review: smoker, HTN, OSA, stroke, hemiplegia, COPD, A-fib not on anticoag, MI  Patient denies shortness of breath, fever, cough and chest pain at PAT appointment.  Patient verbalized understanding and agreement to the Pre-Surgical Instructions that were given to them at this PAT appointment. Patient was also educated of the need to review these PAT instructions again prior to his/her surgery.I reviewed the appropriate phone numbers to call if they have any and questions or concerns.

## 2024-10-29 NOTE — Patient Instructions (Signed)
 SURGICAL WAITING ROOM VISITATION  Patients having surgery or a procedure may have no more than 2 support people in the waiting area - these visitors may rotate.    Children ages 23 and under will not be able to visit patients in [facility] under most circumstances.   Visitors with respiratory illnesses are discouraged from visiting and should remain at home.  If the patient needs to stay at the hospital during part of their recovery, the visitor guidelines for inpatient rooms apply. Pre-op nurse will coordinate an appropriate time for 1 support person to accompany patient in pre-op.  This support person may not rotate.    Please refer to the [facility] website for the visitor guidelines for Inpatients (after your surgery is over and you are in a regular room).       Your procedure is scheduled on: 11/02/24   Report to [facility] Main Entrance    Report to admitting at 7:15 AM   Call this number if you have problems the morning of surgery [phone]   Do not eat food or drink liquids :After Midnight.but may have sips of water  to take meds.     Oral Hygiene is also important to reduce your risk of infection.                                    Remember - BRUSH YOUR TEETH THE MORNING OF SURGERY WITH YOUR REGULAR TOOTHPASTE   Do NOT smoke after Midnight   Stop all vitamins and herbal supplements 7 days before surgery.   Take these medicines the morning of surgery with A SIP OF WATER : Tylenol , baclofen , hydroxyzine , labetalol , pregabalin .  Bring CPAP mask and tubing day of surgery.                              You may not have any metal on your body including hair pins, jewelry, and body piercing             Do not wear  lotions, powders, perfumes/cologne, or deodorant              Men may shave face and neck.   Do not bring valuables to the hospital. [facility] IS NOT             RESPONSIBLE   FOR VALUABLES.   Contacts, glasses, dentures or bridgework  may not be worn into surgery.    DO NOT BRING YOUR HOME MEDICATIONS TO THE HOSPITAL. PHARMACY WILL DISPENSE MEDICATIONS LISTED ON YOUR MEDICATION LIST TO YOU DURING YOUR ADMISSION IN THE HOSPITAL!    Patients discharged on the day of surgery will not be allowed to drive home.  Someone NEEDS to stay with you for the first 24 hours after anesthesia.   Special Instructions: Bring a copy of your healthcare power of attorney and living will documents the day of surgery if you haven't scanned them before.              Please read over the following fact sheets you were given: IF YOU HAVE QUESTIONS ABOUT YOUR PRE-OP INSTRUCTIONS PLEASE CALL [phone] [place]   If you received a COVID test during your pre-op visit  it is requested that you wear a mask when out in public, stay away from anyone that may not be feeling well and notify your surgeon  if you develop symptoms. If you test positive for Covid or have been in contact with anyone that has tested positive in the last 10 days please notify you surgeon.    [place] - Preparing for Surgery Before surgery, you can play an important role.  Because skin is not sterile, your skin needs to be as free of germs as possible.  You can reduce the number of germs on your skin by washing with CHG (chlorahexidine gluconate) soap before surgery.  CHG is an antiseptic cleaner which kills germs and bonds with the skin to continue killing germs even after washing. Please DO NOT use if you have an allergy to CHG or antibacterial soaps.  If your skin becomes reddened/irritated stop using the CHG and inform your nurse when you arrive at Short Stay. Do not shave (including legs and underarms) for at least 48 hours prior to the first CHG shower.  You may shave your face/neck.  Please follow these instructions carefully:  1.  Shower with CHG Soap the night before surgery and the morning of surgery.  2.  If you choose to wash your hair, wash your hair first as usual  with your normal  shampoo.  3.  After you shampoo, rinse your hair and body thoroughly to remove the shampoo.                             4.  Use CHG as you would any other liquid soap.  You can apply chg directly to the skin and wash.  Gently with a scrungie or clean washcloth.  5.  Apply the CHG Soap to your body ONLY FROM THE NECK DOWN.   Do   not use on face/ open                           Wound or open sores. Avoid contact with eyes, ears mouth and   genitals (private parts).                       Wash face,  Genitals (private parts) with your normal soap.             6.  Wash thoroughly, paying special attention to the area where your    surgery  will be performed.  7.  Thoroughly rinse your body with warm water  from the neck down.  8.  DO NOT shower/wash with your normal soap after using and rinsing off the CHG Soap.                9.  Pat yourself dry with a clean towel.            10.  Wear clean pajamas.            11.  Place clean sheets on your bed the night of your first shower and do not  sleep with pets. Day of Surgery : Do not apply any CHG, lotions/deodorants the morning of surgery.  Please wear clean clothes to the hospital/surgery center.  FAILURE TO FOLLOW THESE INSTRUCTIONS MAY RESULT IN THE CANCELLATION OF YOUR SURGERY  PATIENT SIGNATURE_________________________________  NURSE SIGNATURE__________________________________  ________________________________________________________________________

## 2024-10-30 ENCOUNTER — Encounter (HOSPITAL_COMMUNITY)
Admission: RE | Admit: 2024-10-30 | Discharge: 2024-10-30 | Disposition: A | Discharge status: Home / Self Care | Source: Ambulatory Visit | Attending: Urology | Admitting: Urology

## 2024-10-30 ENCOUNTER — Encounter (HOSPITAL_COMMUNITY): Payer: Self-pay

## 2024-10-30 ENCOUNTER — Other Ambulatory Visit: Payer: Self-pay

## 2024-10-30 VITALS — BP 153/97 | HR 70 | Temp 97.7°F | Resp 16 | Ht 74.0 in | Wt 170.0 lb

## 2024-10-30 DIAGNOSIS — I10 Essential (primary) hypertension: Secondary | ICD-10-CM | POA: Insufficient documentation

## 2024-10-30 DIAGNOSIS — Z01812 Encounter for preprocedural laboratory examination: Secondary | ICD-10-CM | POA: Insufficient documentation

## 2024-10-30 DIAGNOSIS — Z01818 Encounter for other preprocedural examination: Secondary | ICD-10-CM

## 2024-10-30 LAB — CBC
HCT: 37.4 % — ABNORMAL LOW (ref 39.0–52.0)
Hemoglobin: 11.7 g/dL — ABNORMAL LOW (ref 13.0–17.0)
MCH: 27.7 pg (ref 26.0–34.0)
MCHC: 31.3 g/dL (ref 30.0–36.0)
MCV: 88.4 fL (ref 80.0–100.0)
Platelets: 301 K/uL (ref 150–400)
RBC: 4.23 MIL/uL (ref 4.22–5.81)
RDW: 18.6 % — ABNORMAL HIGH (ref 11.5–15.5)
WBC: 7.4 K/uL (ref 4.0–10.5)
nRBC: 0 % (ref 0.0–0.2)

## 2024-10-30 LAB — BASIC METABOLIC PANEL WITH GFR
Anion gap: 10 (ref 5–15)
BUN: 10 mg/dL (ref 6–20)
CO2: 23 mmol/L (ref 22–32)
Calcium: 9.3 mg/dL (ref 8.9–10.3)
Chloride: 109 mmol/L (ref 98–111)
Creatinine, Ser: 0.87 mg/dL (ref 0.61–1.24)
GFR, Estimated: >60 mL/min
Glucose, Bld: 97 mg/dL (ref 70–99)
Potassium: 4.4 mmol/L (ref 3.5–5.1)
Sodium: 143 mmol/L (ref 135–145)

## 2024-10-30 NOTE — Anesthesia Preprocedure Evaluation (Signed)
 "                                  Anesthesia Evaluation  Patient identified by MRN, date of birth, ID band Patient awake    Reviewed: Allergy & Precautions, NPO status , Patient's Chart, lab work & pertinent test results  History of Anesthesia Complications Negative for: history of anesthetic complications  Airway Mallampati: II  TM Distance: >3 FB Neck ROM: Limited    Dental  (+) Chipped, Loose, Poor Dentition, Dental Advisory Given   Pulmonary sleep apnea , COPD, Current SmokerPatient did not abstain from smoking.   breath sounds clear to auscultation       Cardiovascular hypertension, Pt. on medications (-) angina + Past MI  + Valvular Problems/Murmurs MR  Rhythm:Regular   1. Left ventricular ejection fraction, by estimation, is 50 to 55%. The  left ventricle has low normal function. The left ventricle has no regional  wall motion abnormalities. There is mild left ventricular hypertrophy.  Left ventricular diastolic  parameters are consistent with Grade II diastolic dysfunction  (pseudonormalization).   2. Right ventricular systolic function is normal. The right ventricular  size is normal.   3. Left atrial size was mildly dilated.   4. The mitral valve is normal in structure. Mild to moderate mitral valve  regurgitation. No evidence of mitral stenosis.   5. The aortic valve is normal in structure. Aortic valve regurgitation is  not visualized. No aortic stenosis is present.   6. The inferior vena cava is normal in size with greater than 50%  respiratory variability, suggesting right atrial pressure of 3 mmHg.      Neuro/Psych  Headaches, Seizures -,  PSYCHIATRIC DISORDERS Anxiety Depression    CVA, Residual Symptoms    GI/Hepatic ,GERD  Controlled,,(+) Hepatitis -, C  Endo/Other    Renal/GU Renal diseaseLab Results      Component                Value               Date                      NA                       139                 09/19/2024                 K                        3.5                 09/19/2024                CO2                      22                  09/19/2024                GLUCOSE                  100 (H)             09/19/2024  BUN                      9                   09/19/2024                CREATININE               0.86                09/19/2024                CALCIUM                   8.3 (L)             09/19/2024                GFR                      70.92               02/27/2017                EGFR                     82                  07/06/2024                GFRNONAA                 >60                 09/19/2024                Musculoskeletal   Abdominal   Peds  Hematology  (+) Blood dyscrasia, anemia Lab Results      Component                Value               Date                      WBC                      14.5 (H)            09/19/2024                HGB                      11.3 (L)            09/19/2024                HCT                      33.5 (L)            09/19/2024                MCV                      81.1                09/19/2024                PLT  248                 09/19/2024              Anesthesia Other Findings Past Medical History: No date: A-fib [facility]) 05/08/2022: Bilateral impacted cerumen No date: Chronic headaches No date: COPD (chronic obstructive pulmonary disease) (HCC) No date: Dysrhythmia No date: GERD (gastroesophageal reflux disease) No date: History of blood transfusion No date: History of kidney stones No date: Hypertension No date: MI (myocardial infarction) (HCC) 02/12/2021: Murmur No date: OSA (obstructive sleep apnea) No date: Seizures (HCC) No date: Stroke [facility])   Reproductive/Obstetrics                              Anesthesia Physical Anesthesia Plan  ASA: 3  Anesthesia Plan: General   Post-op Pain Management: Minimal or no pain anticipated and Ofirmev  IV  (intra-op)*   Induction: Intravenous  PONV Risk Score and Plan: 3 and Ondansetron  and Midazolam   Airway Management Planned: LMA  Additional Equipment: None  Intra-op Plan:   Post-operative Plan: Extubation in OR  Informed Consent: I have reviewed the patients History and Physical, chart, labs and discussed the procedure including the risks, benefits and alternatives for the proposed anesthesia with the patient or authorized representative who has indicated his/her understanding and acceptance.     Dental advisory given  Plan Discussed with: CRNA  Anesthesia Plan Comments: (Discussed risks of anesthesia with patient, including PONV, sore throat, lip/dental/eye damage. Rare risks discussed as well, such as cardiorespiratory and neurological sequelae, and allergic reactions. Discussed the role of CRNA in patient's perioperative care. Patient understands. Patient counseled on benefits of smoking cessation, and increased perioperative risks associated with continued smoking. )         Anesthesia Quick Evaluation  "

## 2024-11-02 ENCOUNTER — Ambulatory Visit (HOSPITAL_COMMUNITY)
Admission: RE | Admit: 2024-11-02 | Discharge: 2024-11-02 | Disposition: A | Discharge status: Home / Self Care | Attending: Urology | Admitting: Urology

## 2024-11-02 ENCOUNTER — Encounter: Admission: RE | Disposition: A | Payer: Self-pay | Attending: Urology

## 2024-11-02 ENCOUNTER — Other Ambulatory Visit (HOSPITAL_COMMUNITY): Payer: Self-pay

## 2024-11-02 ENCOUNTER — Ambulatory Visit (HOSPITAL_COMMUNITY): Admitting: Anesthesiology

## 2024-11-02 ENCOUNTER — Encounter (HOSPITAL_COMMUNITY): Payer: Self-pay | Admitting: Medical

## 2024-11-02 ENCOUNTER — Ambulatory Visit (HOSPITAL_COMMUNITY)

## 2024-11-02 ENCOUNTER — Encounter (HOSPITAL_COMMUNITY): Payer: Self-pay | Admitting: Urology

## 2024-11-02 DIAGNOSIS — Z8249 Family history of ischemic heart disease and other diseases of the circulatory system: Secondary | ICD-10-CM | POA: Diagnosis not present

## 2024-11-02 DIAGNOSIS — K219 Gastro-esophageal reflux disease without esophagitis: Secondary | ICD-10-CM | POA: Diagnosis not present

## 2024-11-02 DIAGNOSIS — G4733 Obstructive sleep apnea (adult) (pediatric): Secondary | ICD-10-CM | POA: Diagnosis not present

## 2024-11-02 DIAGNOSIS — Z8619 Personal history of other infectious and parasitic diseases: Secondary | ICD-10-CM | POA: Diagnosis not present

## 2024-11-02 DIAGNOSIS — J449 Chronic obstructive pulmonary disease, unspecified: Secondary | ICD-10-CM

## 2024-11-02 DIAGNOSIS — Z79899 Other long term (current) drug therapy: Secondary | ICD-10-CM | POA: Diagnosis not present

## 2024-11-02 DIAGNOSIS — I10 Essential (primary) hypertension: Secondary | ICD-10-CM | POA: Insufficient documentation

## 2024-11-02 DIAGNOSIS — I252 Old myocardial infarction: Secondary | ICD-10-CM | POA: Diagnosis not present

## 2024-11-02 DIAGNOSIS — I340 Nonrheumatic mitral (valve) insufficiency: Secondary | ICD-10-CM | POA: Diagnosis not present

## 2024-11-02 DIAGNOSIS — F17210 Nicotine dependence, cigarettes, uncomplicated: Secondary | ICD-10-CM | POA: Insufficient documentation

## 2024-11-02 DIAGNOSIS — N201 Calculus of ureter: Secondary | ICD-10-CM | POA: Diagnosis present

## 2024-11-02 DIAGNOSIS — Z8673 Personal history of transient ischemic attack (TIA), and cerebral infarction without residual deficits: Secondary | ICD-10-CM | POA: Diagnosis not present

## 2024-11-02 DIAGNOSIS — F418 Other specified anxiety disorders: Secondary | ICD-10-CM | POA: Diagnosis not present

## 2024-11-02 DIAGNOSIS — N200 Calculus of kidney: Secondary | ICD-10-CM

## 2024-11-02 HISTORY — PX: CYSTOSCOPY W/ RETROGRADES: SHX1426

## 2024-11-02 HISTORY — PX: CYSTOSCOPY/URETEROSCOPY/HOLMIUM LASER/STENT PLACEMENT: SHX6546

## 2024-11-02 SURGERY — CYSTOSCOPY/URETEROSCOPY/HOLMIUM LASER/STENT PLACEMENT
Anesthesia: General | Site: Ureter

## 2024-11-02 MED ORDER — OXYCODONE HCL 5 MG/5ML PO SOLN: 5.0000 mg | Freq: Once | ORAL | Status: DC | PRN

## 2024-11-02 MED ORDER — LACTATED RINGERS IV SOLN: INTRAVENOUS | Status: DC

## 2024-11-02 MED ORDER — PHENYLEPHRINE 80 MCG/ML (10ML) SYRINGE FOR IV PUSH (FOR BLOOD PRESSURE SUPPORT)
PREFILLED_SYRINGE | INTRAVENOUS | Status: AC
  Filled 2024-11-02: qty 10

## 2024-11-02 MED ORDER — OXYCODONE HCL 5 MG PO TABS: 5.0000 mg | ORAL_TABLET | Freq: Once | ORAL | Status: DC | PRN

## 2024-11-02 MED ORDER — FENTANYL CITRATE (PF) 100 MCG/2ML IJ SOLN
INTRAMUSCULAR | Status: DC | PRN
  Administered 2024-11-02 (×3): 50 ug via INTRAVENOUS

## 2024-11-02 MED ORDER — FENTANYL CITRATE (PF) 100 MCG/2ML IJ SOLN
INTRAMUSCULAR | Status: AC
  Filled 2024-11-02: qty 2

## 2024-11-02 MED ORDER — ALBUTEROL SULFATE HFA 108 (90 BASE) MCG/ACT IN AERS
INHALATION_SPRAY | RESPIRATORY_TRACT | Status: DC | PRN
  Administered 2024-11-02: 3 via RESPIRATORY_TRACT

## 2024-11-02 MED ORDER — ONDANSETRON HCL 4 MG/2ML IJ SOLN
INTRAMUSCULAR | Status: DC | PRN
  Administered 2024-11-02: 4 mg via INTRAVENOUS

## 2024-11-02 MED ORDER — ACETAMINOPHEN 10 MG/ML IV SOLN: 1000.0000 mg | Freq: Once | INTRAVENOUS | Status: DC | PRN

## 2024-11-02 MED ORDER — FENTANYL CITRATE (PF) 50 MCG/ML IJ SOSY: 25.0000 ug | PREFILLED_SYRINGE | INTRAMUSCULAR | Status: DC | PRN

## 2024-11-02 MED ORDER — IOHEXOL 300 MG/ML  SOLN
INTRAMUSCULAR | Status: DC | PRN
  Administered 2024-11-02: 10 mL via URETHRAL

## 2024-11-02 MED ORDER — CIPROFLOXACIN HCL 500 MG PO TABS
500.0000 mg | ORAL_TABLET | Freq: Two times a day (BID) | ORAL | 0 refills | Status: DC
  Filled 2024-11-02: qty 14, 7d supply, fill #0

## 2024-11-02 MED ORDER — SODIUM CHLORIDE 0.9 % IR SOLN
Status: DC | PRN
  Administered 2024-11-02: 3000 mL via INTRAVESICAL

## 2024-11-02 MED ORDER — ORAL CARE MOUTH RINSE: 15.0000 mL | Freq: Once | OROMUCOSAL | Status: AC

## 2024-11-02 MED ORDER — ACETAMINOPHEN 10 MG/ML IV SOLN
INTRAVENOUS | Status: AC
  Filled 2024-11-02: qty 100

## 2024-11-02 MED ORDER — PHENYLEPHRINE HCL-NACL 20-0.9 MG/250ML-% IV SOLN
INTRAVENOUS | Status: DC | PRN
  Administered 2024-11-02: 100 ug/min via INTRAVENOUS

## 2024-11-02 MED ORDER — EPHEDRINE 5 MG/ML INJ
INTRAVENOUS | Status: AC
  Filled 2024-11-02: qty 5

## 2024-11-02 MED ORDER — SODIUM CHLORIDE 0.9 % IV SOLN
2.0000 g | INTRAVENOUS | Status: AC
  Administered 2024-11-02: 2 g via INTRAVENOUS
  Filled 2024-11-02: qty 20

## 2024-11-02 MED ORDER — TRAMADOL HCL 50 MG PO TABS
50.0000 mg | ORAL_TABLET | Freq: Four times a day (QID) | ORAL | 0 refills | Status: AC | PRN
  Filled 2024-11-02: qty 12, 3d supply, fill #0

## 2024-11-02 MED ORDER — PHENYLEPHRINE 80 MCG/ML (10ML) SYRINGE FOR IV PUSH (FOR BLOOD PRESSURE SUPPORT)
PREFILLED_SYRINGE | INTRAVENOUS | Status: DC | PRN
  Administered 2024-11-02: 160 ug via INTRAVENOUS

## 2024-11-02 MED ORDER — DEXAMETHASONE SOD PHOSPHATE PF 10 MG/ML IJ SOLN
INTRAMUSCULAR | Status: DC | PRN
  Administered 2024-11-02: 4 mg via INTRAVENOUS

## 2024-11-02 MED ORDER — CEFAZOLIN SODIUM-DEXTROSE 2-4 GM/100ML-% IV SOLN
2.0000 g | INTRAVENOUS | Status: AC
  Administered 2024-11-02: 2 g via INTRAVENOUS
  Filled 2024-11-02: qty 100

## 2024-11-02 MED ORDER — LIDOCAINE HCL (PF) 2 % IJ SOLN
INTRAMUSCULAR | Status: AC
  Filled 2024-11-02: qty 5

## 2024-11-02 MED ORDER — PROPOFOL 10 MG/ML IV BOLUS
INTRAVENOUS | Status: DC | PRN
  Administered 2024-11-02: 120 mg via INTRAVENOUS

## 2024-11-02 MED ORDER — LIDOCAINE HCL (CARDIAC) PF 100 MG/5ML IV SOSY
PREFILLED_SYRINGE | INTRAVENOUS | Status: DC | PRN
  Administered 2024-11-02: 100 mg via INTRAVENOUS

## 2024-11-02 MED ORDER — CHLORHEXIDINE GLUCONATE 0.12 % MT SOLN
15.0000 mL | Freq: Once | OROMUCOSAL | Status: AC
  Administered 2024-11-02: 15 mL via OROMUCOSAL

## 2024-11-02 MED ORDER — ROCURONIUM BROMIDE 10 MG/ML (PF) SYRINGE
PREFILLED_SYRINGE | INTRAVENOUS | Status: AC
  Filled 2024-11-02: qty 10

## 2024-11-02 MED ORDER — ACETAMINOPHEN 10 MG/ML IV SOLN
INTRAVENOUS | Status: DC | PRN
  Administered 2024-11-02: 1000 mg via INTRAVENOUS

## 2024-11-02 MED ORDER — PROPOFOL 10 MG/ML IV BOLUS
INTRAVENOUS | Status: AC
  Filled 2024-11-02: qty 20

## 2024-11-02 MED ORDER — ALBUTEROL SULFATE HFA 108 (90 BASE) MCG/ACT IN AERS
INHALATION_SPRAY | RESPIRATORY_TRACT | Status: AC
  Filled 2024-11-02: qty 6.7

## 2024-11-02 MED ORDER — DROPERIDOL 2.5 MG/ML IJ SOLN: 0.6250 mg | Freq: Once | INTRAMUSCULAR | Status: DC | PRN

## 2024-11-02 SURGICAL SUPPLY — 22 items
BAG URO CATCHER STRL LF (MISCELLANEOUS) ×1 IMPLANT
BASKET LASER NITINOL 1.9FR (BASKET) IMPLANT
BASKET ZERO TIP NITINOL 2.4FR (BASKET) IMPLANT
CATH URETERAL DUAL LUMEN 10F (MISCELLANEOUS) IMPLANT
CATH URETL OPEN END 6FR 70 (CATHETERS) ×1 IMPLANT
CLOTH BEACON ORANGE TIMEOUT ST (SAFETY) ×1 IMPLANT
GLOVE BIO SURGEON STRL SZ7.5 (GLOVE) ×1 IMPLANT
GOWN STRL REUS W/ TWL XL LVL3 (GOWN DISPOSABLE) ×1 IMPLANT
GUIDEWIRE ANG ZIPWIRE 038X150 (WIRE) IMPLANT
GUIDEWIRE STR DUAL SENSOR (WIRE) ×1 IMPLANT
KIT TURNOVER KIT A (KITS) ×1 IMPLANT
LASER FIB FLEXIVA PULSE ID 365 (Laser) IMPLANT
MANIFOLD NEPTUNE II (INSTRUMENTS) ×1 IMPLANT
NS IRRIG 1000ML POUR BTL (IV SOLUTION) IMPLANT
PACK CYSTO (CUSTOM PROCEDURE TRAY) ×1 IMPLANT
SHEATH NAVIGATOR HD 11/13X28 (SHEATH) IMPLANT
SHEATH NAVIGATOR HD 11/13X36 (SHEATH) IMPLANT
SHEATH URETERAL FLEX 12X35 (SHEATH) IMPLANT
STENT URET 6FRX26 CONTOUR (STENTS) IMPLANT
TRACTIP FLEXIVA PULS ID 200XHI (Laser) IMPLANT
TUBING CONNECTING 10 (TUBING) ×1 IMPLANT
TUBING UROLOGY SET (TUBING) ×1 IMPLANT

## 2024-11-02 NOTE — Anesthesia Postprocedure Evaluation (Signed)
"   Anesthesia Post Note  Patient: Jason Romero  Procedure(s) Performed: CYSTOSCOPY/URETEROSCOPY/HOLMIUM LASER/STENT PLACEMENT (Ureter) CYSTOSCOPY, WITH RETROGRADE PYELOGRAM (Ureter)     Patient location during evaluation: PACU Anesthesia Type: General Level of consciousness: awake and alert Pain management: pain level controlled Vital Signs Assessment: post-procedure vital signs reviewed and stable Respiratory status: spontaneous breathing, nonlabored ventilation, respiratory function stable and patient connected to nasal cannula oxygen Cardiovascular status: blood pressure returned to baseline and stable Postop Assessment: no apparent nausea or vomiting Anesthetic complications: no   No notable events documented.  Last Vitals:  Vitals:   11/02/24 1213 11/02/24 1215  BP: 139/88 (!) 136/92  Pulse: 96 99  Resp: 16   Temp: 36.4 C   SpO2: 94% 94%    Last Pain:  Vitals:   11/02/24 1215  TempSrc:   PainSc: 0-No pain                 [name]      "

## 2024-11-02 NOTE — Transfer of Care (Signed)
 Immediate Anesthesia Transfer of Care Note  Patient: Jason Romero  Procedure(s) Performed: CYSTOSCOPY/URETEROSCOPY/HOLMIUM LASER/STENT PLACEMENT (Ureter) CYSTOSCOPY, WITH RETROGRADE PYELOGRAM (Ureter)  Patient Location: PACU  Anesthesia Type:General  Level of Consciousness: awake, alert , oriented, and patient cooperative  Airway & Oxygen Therapy: Patient Spontanous Breathing and Patient connected to face mask oxygen  Post-op Assessment: Report given to RN and Post -op Vital signs reviewed and stable  Post vital signs: Reviewed and stable  Last Vitals:  Vitals Value Taken Time  BP 127/96 11/02/24 11:38  Temp 36.5 C 11/02/24 11:38  Pulse 96 11/02/24 11:43  Resp 10 11/02/24 11:43  SpO2 100 % 11/02/24 11:43  Vitals shown include unfiled device data.  Last Pain:  Vitals:   11/02/24 1138  TempSrc:   PainSc: 0-No pain         Complications: No notable events documented.

## 2024-11-02 NOTE — Anesthesia Procedure Notes (Signed)
 Procedure Name: LMA Insertion Date/Time: 11/02/2024 10:37 AM  Performed by: Nada Corean CROME, CRNAPre-anesthesia Checklist: Patient identified, Emergency Drugs available, Patient being monitored, Suction available and Timeout performed Patient Re-evaluated:Patient Re-evaluated prior to induction Oxygen Delivery Method: Circle system utilized Preoxygenation: Pre-oxygenation with 100% oxygen Induction Type: IV induction Ventilation: Mask ventilation without difficulty LMA: LMA with gastric port inserted LMA Size: 5.0 Tube type: Oral Number of attempts: 1 Placement Confirmation: positive ETCO2 and breath sounds checked- equal and bilateral Tube secured with: Tape Dental Injury: Teeth and Oropharynx as per pre-operative assessment

## 2024-11-02 NOTE — Op Note (Signed)
 Operative Note  Preoperative diagnosis:  1.  Right renal calculus  Postoperative diagnosis: 1.  Right renal calculus  Procedure(s): 1.  Cystoscopy with right retrograde pyelogram, right ureteroscopy with laser lithotripsy and ureteral stent exchange  Surgeon: Sherwood Edison, MD  Assistants: None  Anesthesia: General  Complications: None immediate  EBL: Minimal  Specimens: 1.  None  Drains/Catheters: 1.  6 x 26 double-J ureteral stent  Intraoperative findings: 1.  Normal anterior urethra 2.  Borderline obstructive prostate 3.  Bladder mucosa without any tumors or stones. 4.  Impacted large right ureteropelvic junction calculus fragmented on the settings to tiny fragments.  All stone treated. 5.  Retrograde pyelogram after treatment of the stone showed no filling defect and no hydronephrosis.  Indication: 53 year old male with a right ureteropelvic junction calculus status post ureteral stent placement presents for definitive management of his stone.  Description of procedure:  The patient was identified and consent was obtained.  The patient was taken to the operating room and placed in the supine position.  The patient was placed under general anesthesia.  Perioperative antibiotics were administered.  The patient was placed in dorsal lithotomy.  Patient was prepped and draped in a standard sterile fashion and a timeout was performed.  a 21 French rigid cystoscope was advanced into the urethra and into the bladder.  Complete cystoscopy was performed with findings noted above.  Right stent was grasped pulled just beyond the urethral meatus.  A wire was advanced through the stent up to the kidney under fluoroscopic guidance.  Stent was withdrawn.  12 x 14 ureteral access sheath was advanced over the wire under continuous fluoroscopic guidance up to the proximal ureter.  Inner sheath was withdrawn.  Second wire was advanced through the sheath and into the kidney under fluoroscopic  guidance.  Sheath was withdrawn.  1 the wires was secured to the drape as a safety wire and the other wire was used to advance a 12 x 14 ureteral access sheath over the wire under continuous fluoroscopic guidance to the proximal ureter.  Inner sheath and wire were withdrawn.  Digital ureteroscopy was performed and the stone was lasered fragmented all dust settings to tiny fragments.  Entire stone was treated.  Once only tiny stones remained, I shot a retrograde pyelogram through the scope with findings noted above.  I removed the scope along with the access sheath visualizing the ureter upon removal.  There were no ureteral calculi and no ureteral injury was identified.  I backloaded the wire onto rigid cystoscope and advanced that into the bladder followed by routine placement of a 6 x 26 double-J ureteral stent.  Fluoroscopy confirmed proximal placement and direct visualization confirmed a good coil in the bladder.  I drained the bladder withdrew the scope.  Patient tolerated procedure well was stable postoperatively.  Plan: Follow-up in 1 week for stent removal

## 2024-11-02 NOTE — H&P (Signed)
 H&P  Chief Complaint: right renal calculus  History of Present Illness: 53 YO M with a right ureteral calculus here for URS.  Past Medical History:  Diagnosis Date   A-fib [facility])    Bilateral impacted cerumen 05/08/2022   Chronic headaches    COPD (chronic obstructive pulmonary disease) (HCC)    Dysrhythmia    GERD (gastroesophageal reflux disease)    History of blood transfusion    History of kidney stones    Hypertension    MI (myocardial infarction) (HCC)    Murmur 02/12/2021   OSA (obstructive sleep apnea)    Seizures (HCC)    Stroke [facility])    Past Surgical History:  Procedure Laterality Date   CHOLECYSTECTOMY     CYSTOSCOPY WITH STENT PLACEMENT Left 09/19/2024   Procedure: CYSTOSCOPY, WITH STENT INSERTION;  Surgeon: Carolee Sherwood JONETTA DOUGLAS, MD;  Location: [facility] OR;  Service: Urology;  Laterality: Left;   ELBOW SURGERY Left     Home Medications:  Medications Prior to Admission  Medication Sig Dispense Refill Last Dose/Taking   acetaminophen  (TYLENOL ) 325 MG tablet Take 2 tablets (650 mg total) by mouth every 4 (four) hours as needed for mild pain (or temp > 37.5 C (99.5 F)).   Taking As Needed   albuterol  (VENTOLIN  HFA) 108 (90 Base) MCG/ACT inhaler Inhale 2 puffs into the lungs every 6 (six) hours as needed for wheezing or shortness of breath. 18 g 2 Taking As Needed   aspirin  EC 81 MG tablet Take 1 tablet (81 mg total) by mouth daily. Swallow whole. 30 tablet 12 Taking   atorvastatin  (LIPITOR) 20 MG tablet Take 1 tablet (20 mg total) by mouth daily. (Patient taking differently: Take 20 mg by mouth at bedtime.) 90 tablet 1 Taking Differently   baclofen  (LIORESAL ) 20 MG tablet Take 1 tablet (20 mg total) by mouth 3 (three) times daily. 270 tablet 1 Taking   hydrOXYzine  (VISTARIL ) 50 MG capsule Take 1 capsule (50 mg total) by mouth every 8 (eight) hours as needed for anxiety. (Patient taking differently: Take 50 mg by mouth in the morning, at noon, and at bedtime.) 270 capsule 0 Taking  Differently   labetalol  (NORMODYNE ) 300 MG tablet Take 1 tablet (300 mg total) by mouth 2 (two) times daily. 180 tablet 3 Taking   pregabalin  (LYRICA ) 150 MG capsule Take 1 capsule (150 mg total) by mouth 2 (two) times daily. 60 capsule 6 Taking   traZODone  (DESYREL ) 100 MG tablet Take 1.5 tablets (150 mg total) by mouth at bedtime as needed for sleep. (Patient taking differently: Take 100 mg by mouth at bedtime.) 135 tablet 2 Taking Differently   [Paused] amLODipine  (NORVASC ) 10 MG tablet Take 1 tablet (10 mg total) by mouth daily. 90 tablet 1    Blood Pressure Monitoring (BLOOD PRESSURE KIT) DEVI Use to monitor blood pressure 1 each 0    ciprofloxacin  (CIPRO ) 500 MG tablet Take 1 tablet (500 mg total) by mouth 2 (two) times daily. (Patient not taking: Reported on 10/27/2024) 19 tablet 0 Not Taking   [Paused] hydrALAZINE  (APRESOLINE ) 50 MG tablet Take 1 tablet (50 mg total) by mouth in the morning and at bedtime. 180 tablet 1    Allergies: Allergies[1]  Family History  Problem Relation Age of Onset   Alcoholism Other    Arthritis Other    Breast cancer Other    Heart disease Mother    Social History:  reports that he has been smoking cigarettes. He has never used  smokeless tobacco. He reports that he does not currently use alcohol. He reports current drug use. Drug: Marijuana.  ROS: A complete review of systems was performed.  All systems are negative except for pertinent findings as noted. ROS   Physical Exam:  Vital signs in last 24 hours: Temp:  [97.6 F (36.4 C)] 97.6 F (36.4 C) (12/22 0736) Pulse Rate:  [96] 96 (12/22 0736) Resp:  [16] 16 (12/22 0736) BP: (136)/(87) 136/87 (12/22 0736) SpO2:  [99 %] 99 % (12/22 0736) General:  Alert and oriented, No acute distress HEENT: Normocephalic, atraumatic Neck: No JVD or lymphadenopathy Cardiovascular: Regular rate and rhythm Lungs: Regular rate and effort Abdomen: Soft, nontender, nondistended, no abdominal masses Back: No CVA  tenderness Extremities: No edema Neurologic: Grossly intact  Laboratory Data:  No results found for this or any previous visit (from the past 24 hours). No results found for this or any previous visit (from the past 240 hours). Creatinine: Recent Labs    10/30/24 1021  CREATININE 0.87    Impression/Assessment:  Right ureteral calculus  Plan:  Proceed with right ureteroscopy with laser lithotripsy and ureteral stent placement. Possibility of staged procedure discussed  Sherwood JONETTA Edison, III 11/02/2024, 7:50 AM      [1]  Allergies Allergen Reactions   Hydrocodone Hives   Ibuprofen Other (See Comments)    Pt can't take this medication because it interacts with other medications that he is taking.

## 2024-11-02 NOTE — Discharge Instructions (Addendum)

## 2024-11-03 ENCOUNTER — Telehealth: Payer: Self-pay | Admitting: Internal Medicine

## 2024-11-03 ENCOUNTER — Encounter (HOSPITAL_COMMUNITY): Payer: Self-pay | Admitting: Urology

## 2024-11-03 MED ORDER — MISC. DEVICES MISC: 0 refills | Status: DC

## 2024-11-03 NOTE — Telephone Encounter (Signed)
 Done

## 2024-11-03 NOTE — Addendum Note (Signed)
 Addended by: Sundus Pete on: 11/03/2024 12:09 PM   Modules accepted: Orders

## 2024-11-03 NOTE — Telephone Encounter (Signed)
 Copied from CRM #[id number]. Topic: Clinical - Order For Equipment >> Nov 03, 2024  9:32 AM Amy B wrote:  Reason for CRM: Patient is requesting an order for Depends.  Please advise.

## 2024-11-04 ENCOUNTER — Other Ambulatory Visit: Payer: Self-pay

## 2024-11-04 MED ORDER — MISC. DEVICES MISC: 0 refills | Status: AC

## 2024-11-04 NOTE — Telephone Encounter (Signed)
 Unable to send order due to no recent OV notes from [facility] stating incontinence issues. Patient will need an OV to address incontinence supply need. Upcoming appointment on12/30/2025 when provider is back in office to document need for [employer].   Called patient but no answer. Unable to LVM.

## 2024-11-10 ENCOUNTER — Ambulatory Visit: Attending: Internal Medicine | Admitting: Internal Medicine

## 2024-11-10 ENCOUNTER — Other Ambulatory Visit (HOSPITAL_COMMUNITY): Payer: Self-pay

## 2024-11-10 VITALS — BP 154/87 | HR 94 | Temp 97.7°F

## 2024-11-10 DIAGNOSIS — Z87442 Personal history of urinary calculi: Secondary | ICD-10-CM | POA: Diagnosis not present

## 2024-11-10 DIAGNOSIS — I482 Chronic atrial fibrillation, unspecified: Secondary | ICD-10-CM | POA: Diagnosis not present

## 2024-11-10 DIAGNOSIS — R590 Localized enlarged lymph nodes: Secondary | ICD-10-CM

## 2024-11-10 DIAGNOSIS — N319 Neuromuscular dysfunction of bladder, unspecified: Secondary | ICD-10-CM

## 2024-11-10 DIAGNOSIS — R3981 Functional urinary incontinence: Secondary | ICD-10-CM

## 2024-11-10 DIAGNOSIS — Z720 Tobacco use: Secondary | ICD-10-CM

## 2024-11-10 DIAGNOSIS — Z9889 Other specified postprocedural states: Secondary | ICD-10-CM

## 2024-11-10 DIAGNOSIS — Z23 Encounter for immunization: Secondary | ICD-10-CM

## 2024-11-10 DIAGNOSIS — I10 Essential (primary) hypertension: Secondary | ICD-10-CM | POA: Diagnosis not present

## 2024-11-10 MED ORDER — HYDRALAZINE HCL 50 MG PO TABS
50.0000 mg | ORAL_TABLET | Freq: Two times a day (BID) | ORAL | 1 refills | Status: AC
  Filled 2024-11-10: qty 180, 90d supply, fill #0

## 2024-11-10 MED ORDER — AMLODIPINE BESYLATE 10 MG PO TABS
10.0000 mg | ORAL_TABLET | Freq: Every day | ORAL | 1 refills | Status: AC
  Filled 2024-11-10: qty 90, 90d supply, fill #0

## 2024-11-10 NOTE — Progress Notes (Unsigned)
 "   Patient ID: Jason Romero, male    DOB: 1971-04-18  MRN: [id number]  CC: Hypertension (HTN f/u./Document incontinence supply need - [facility] /Tdap vax administered on 11/10/24 - C.A.)   Subjective: Jason Romero is a 53 y.o. 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  History of Present Illness Jason Romero is a 53 year old male with kidney stones and atrial fibrillation who presents for follow-up after recent hospitalization for a kidney stone. He is accompanied by [name], his caregiver.  He was hospitalized last month for sepsis secondary to complicated UTI due to 1.8 cm ureteropelvic junction calculus. A stent was placed on the right side, changed on the 22nd of this month, and removed yesterday by urologist Dr. Carolee. There has been no significant change in symptoms post-stent removal, with only minor bleeding noted post-procedure. This was his first experience with a kidney stone.  He has a history of urinary incontinence, which has worsened over the past few months. [name] notes that he has been incontinent since his stroke, but the frequency has increased recently, making it difficult for her to manage without assistance in getting him to the bathroom in time. He urinates frequently at night, requiring incontinence supplies.  Has Chux but Depends too expensive.  He has a history of hypertension, currently managed with labetalol  300 mg twice daily and hydralazine  50 mg twice daily. He was told to hold Norvasc  10 mg upon hosp dischg and has not been restarted. His blood pressure  has been running in the 140s, but recent readings have been higher, around 151/97.   I note on the hospital discharge history of atrial fibrillation.  He has a history of atrial fibrillation even before his stroke per patient and his wife.  He reportedly was never placed on anticoagulation given history of intracranial bleed.  Wife states they were told that he should just take aspirin .  He denies any palpitations.  During his recent hospitalization, a renal CT scan revealed mildly enlarged lymph nodes in the groin and retroperitoneal areas. Thought to be rreactive at the time. It was recommended that scan be repeated in 1-2 mths to assure resolution.    Patient Active Problem List   Diagnosis Date Noted   Chronic atrial fibrillation (HCC) 11/10/2024   CAP (community acquired pneumonia) 09/19/2024   Hematuria 09/19/2024   Hypokalemia 09/19/2024   Dental abscess 09/27/2021   Chronic hepatitis C without hepatic coma (HCC) 02/28/2021   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) 02/14/2021   Hyperlipidemia 02/14/2021   Visual changes 02/14/2021   Obesity 02/12/2021   Back pain 02/12/2021   Depression 01/26/2021   Spastic hemiplegia affecting nondominant side (HCC)    Dysphagia due to recent stroke 09/20/2020   History of intracranial hemorrhage 09/20/2020   History of stroke with residual deficit 09/11/2020   Sleep apnea 09/06/2016   Tobacco abuse 09/06/2016   UTI (urinary tract infection) 05/08/2016   Unintentional weight loss 03/22/2016   Essential hypertension 02/12/2016   Anxiety 02/12/2016   Migraines 02/12/2016   COPD (chronic obstructive pulmonary disease) (HCC) 02/01/2016  Gastroesophageal reflux disease without esophagitis 02/01/2016     Medications Ordered Prior to Encounter[1]  Allergies[2]  Social History   Socioeconomic History   Marital status: Married    Spouse name: Not on file   Number of children: Not on file   Years of  education: Not on file   Highest education level: Not on file  Occupational History   Not on file  Tobacco Use   Smoking status: Every Day    Current packs/day: 1.00    Types: Cigarettes   Smokeless tobacco: Never  Vaping Use   Vaping status: Never Used  Substance and Sexual Activity   Alcohol use: Not Currently    Alcohol/week: 0.0 standard drinks of alcohol   Drug use: Yes    Types: Marijuana    Comment: used: several days ago   Sexual activity: Not Currently  Other Topics Concern   Not on file  Social History Narrative   Left handed   Social Drivers of Health   Tobacco Use: High Risk (11/02/2024)   Patient History    Smoking Tobacco Use: Every Day    Smokeless Tobacco Use: Never    Passive Exposure: Not on file  Financial Resource Strain: Medium Risk (11/08/2021)   Overall Financial Resource Strain (CARDIA)    Difficulty of Paying Living Expenses: Somewhat hard  Food Insecurity: No Food Insecurity (09/19/2024)   Epic    Worried About Programme Researcher, Broadcasting/film/video in the Last Year: Never true    Ran Out of Food in the Last Year: Never true  Transportation Needs: No Transportation Needs (09/19/2024)   Epic    Lack of Transportation (Medical): No    Lack of Transportation (Non-Medical): No  Physical Activity: Inactive (11/04/2023)   Exercise Vital Sign    Days of Exercise per Week: 0 days    Minutes of Exercise per Session: 0 min  Stress: No Stress Concern Present (11/04/2023)   [employer] of Occupational Health - Occupational Stress Questionnaire    Feeling of Stress : Not at all  Social Connections: Moderately Isolated (11/04/2023)   Social Connection and Isolation Panel    Frequency of Communication with Friends and Family: More than three times a week    Frequency of Social Gatherings with Friends and Family: Twice a week    Attends Religious Services: Never    Database Administrator or Organizations: No    Attends Banker Meetings: Never    Marital  Status: Married  Catering Manager Violence: Patient Unable To Answer (09/19/2024)   Epic    Fear of Current or Ex-Partner: Patient unable to answer    Emotionally Abused: Patient unable to answer    Physically Abused: Patient unable to answer    Sexually Abused: Patient unable to answer  Depression (PHQ2-9): Low Risk (07/06/2024)   Depression (PHQ2-9)    PHQ-2 Score: 0  Alcohol Screen: Low Risk (11/04/2023)   Alcohol Screen    Last Alcohol Screening Score (AUDIT): 0  Housing: Low Risk (09/19/2024)   Epic    Unable to Pay for Housing in the Last Year: No    Number of Times Moved in the Last Year: 0    Homeless in the Last Year: No  Utilities: Not At Risk (09/19/2024)   Epic    Threatened with loss of utilities: No  Health Literacy: Inadequate Health Literacy (11/04/2023)   B1300 Health Literacy    Frequency of need for help with medical instructions: Always  Family History  Problem Relation Age of Onset   Alcoholism Other    Arthritis Other    Breast cancer Other    Heart disease Mother     Past Surgical History:  Procedure Laterality Date   CHOLECYSTECTOMY     CYSTOSCOPY W/ RETROGRADES N/A 11/02/2024   Procedure: CYSTOSCOPY, WITH RETROGRADE PYELOGRAM;  Surgeon: Carolee Sherwood JONETTA DOUGLAS, MD;  Location: [facility];  Service: Urology;  Laterality: N/A;   CYSTOSCOPY WITH STENT PLACEMENT Left 09/19/2024   Procedure: CYSTOSCOPY, WITH STENT INSERTION;  Surgeon: Carolee Sherwood JONETTA DOUGLAS, MD;  Location: [facility] OR;  Service: Urology;  Laterality: Left;   CYSTOSCOPY/URETEROSCOPY/HOLMIUM LASER/STENT PLACEMENT N/A 11/02/2024   Procedure: CYSTOSCOPY/URETEROSCOPY/HOLMIUM LASER/STENT PLACEMENT;  Surgeon: Carolee Sherwood JONETTA DOUGLAS, MD;  Location: [facility];  Service: Urology;  Laterality: N/A;   ELBOW SURGERY Left     ROS: Review of Systems Negative except as stated above  PHYSICAL EXAM: BP (!) 154/87 (BP Location: Left Arm, Patient Position: Sitting, Cuff Size: Normal)   Pulse 94   Temp 97.7 F (36.5 C) (Oral)    SpO2 96%   Physical Exam   General appearance -middle-age Caucasian male sitting in wheelchair in NAD Mental status -patient is alert.  Speech is slurred which is his baseline. Neck - supple, no significant adenopathy Chest - clear to auscultation, no wheezes, rales or rhonchi, symmetric air entry Heart -heart rate is irregularly irregular but rate controlled Neurological -hemiplegia affecting right side Extremities -lower extremity edema     Latest Ref Rng & Units 10/30/2024   10:21 AM 09/22/2024    3:31 AM 09/21/2024    3:26 AM  CMP  Glucose 70 - 99 mg/dL 97  91  89   BUN 6 - 20 mg/dL 10  11  13    Creatinine 0.61 - 1.24 mg/dL 9.12  9.21  9.15   Sodium 135 - 145 mmol/L 143  138  140   Potassium 3.5 - 5.1 mmol/L 4.4  3.3  3.4   Chloride 98 - 111 mmol/L 109  107  107   CO2 22 - 32 mmol/L 23  23  23    Calcium  8.9 - 10.3 mg/dL 9.3  8.1  8.3    Lipid Panel     Component Value Date/Time   CHOL 91 (L) 07/06/2024 1524   TRIG 65 07/06/2024 1524   HDL 30 (L) 07/06/2024 1524   CHOLHDL 3.0 07/06/2024 1524   CHOLHDL 3.1 09/12/2020 0513   VLDL 21 09/12/2020 0513   LDLCALC 46 07/06/2024 1524    CBC    Component Value Date/Time   WBC 7.4 10/30/2024 1021   RBC 4.23 10/30/2024 1021   HGB 11.7 (L) 10/30/2024 1021   HGB 12.6 (L) 07/06/2024 1524   HCT 37.4 (L) 10/30/2024 1021   HCT 39.4 07/06/2024 1524   PLT 301 10/30/2024 1021   PLT 300 07/06/2024 1524   MCV 88.4 10/30/2024 1021   MCV 87 07/06/2024 1524   MCV 84 03/10/2012 1336   MCH 27.7 10/30/2024 1021   MCHC 31.3 10/30/2024 1021   RDW 18.6 (H) 10/30/2024 1021   RDW 15.5 (H) 07/06/2024 1524   RDW 14.4 03/10/2012 1336   LYMPHSABS 2.2 02/26/2023 1416   LYMPHSABS 2.3 03/10/2012 1336   MONOABS 0.5 03/15/2022 1511   MONOABS 0.8 03/10/2012 1336   EOSABS 0.2 02/26/2023 1416   EOSABS 0.2 03/10/2012 1336   BASOSABS 0.1 02/26/2023 1416   BASOSABS 0.1 03/10/2012 1336    ASSESSMENT AND PLAN: 1.  Essential hypertension  (Primary) Not at goal.  Continue labetalol , and hydralazine .  Restart amlodipine  10 mg daily - hydrALAZINE  (APRESOLINE ) 50 MG tablet; Take 1 tablet (50 mg total) by mouth in the morning and at bedtime.  Dispense: 180 tablet; Refill: 1 - amLODipine  (NORVASC ) 10 MG tablet; Take 1 tablet (10 mg total) by mouth daily.  Dispense: 90 tablet; Refill: 1  2. Functional urinary incontinence 3. Neurogenic bladder Patient with functional incontinence and neurogenic bladder secondary to previous stroke. Patient requesting depends undergarment.  Prescription will be sent to [facility].  4. Tobacco abuse Strongly advised to quit but he is not ready to give a trial of quitting.  5. History of ureteroscopic laser fragmentation of ureteral calculus Resolved. Recent ureteral stent removal. Advised of the importance of drinking 4 to 8 glasses of water  daily.  Low-salt diet.  6. Localized enlarged lymph nodes Will order CT abd/pelvis to see if lymphadenopathy  have resolve  7. Chronic atrial fibrillation (HCC) Patient to continue aspirin .  Rate control on labetalol .  8. Need for Tdap vaccination - Tdap vaccine greater than or equal to 7yo IM    Patient was given the opportunity to ask questions.  Patient verbalized understanding of the plan and was able to repeat key elements of the plan.   This documentation was completed using Paediatric nurse.  Any transcriptional errors are unintentional.  Orders Placed This Encounter  Procedures   For home use only DME Other see comment   Tdap vaccine greater than or equal to 7yo IM     Requested Prescriptions   Signed Prescriptions Disp Refills   hydrALAZINE  (APRESOLINE ) 50 MG tablet 180 tablet 1    Sig: Take 1 tablet (50 mg total) by mouth in the morning and at bedtime.   amLODipine  (NORVASC ) 10 MG tablet 90 tablet 1    Sig: Take 1 tablet (10 mg total) by mouth daily.    Return in about 4 months (around 03/11/2025).  Barnie Louder, MD, FACP     [1]  Current Outpatient Medications on File Prior to Visit  Medication Sig Dispense Refill   acetaminophen  (TYLENOL ) 325 MG tablet Take 2 tablets (650 mg total) by mouth every 4 (four) hours as needed for mild pain (or temp > 37.5 C (99.5 F)).     albuterol  (VENTOLIN  HFA) 108 (90 Base) MCG/ACT inhaler Inhale 2 puffs into the lungs every 6 (six) hours as needed for wheezing or shortness of breath. 18 g 2   aspirin  EC 81 MG tablet Take 1 tablet (81 mg total) by mouth daily. Swallow whole. 30 tablet 12   atorvastatin  (LIPITOR) 20 MG tablet Take 1 tablet (20 mg total) by mouth daily. (Patient taking differently: Take 20 mg by mouth at bedtime.) 90 tablet 1   baclofen  (LIORESAL ) 20 MG tablet Take 1 tablet (20 mg total) by mouth 3 (three) times daily. 270 tablet 1   Blood Pressure Monitoring (BLOOD PRESSURE KIT) DEVI Use to monitor blood pressure 1 each 0   hydrOXYzine  (VISTARIL ) 50 MG capsule Take 1 capsule (50 mg total) by mouth every 8 (eight) hours as needed for anxiety. (Patient taking differently: Take 50 mg by mouth in the morning, at noon, and at bedtime.) 270 capsule 0   labetalol  (NORMODYNE ) 300 MG tablet Take 1 tablet (300 mg total) by mouth 2 (two) times daily. 180 tablet 3   Misc. Devices MISC Depends.  Diagnosis - urinary incontinence 1 each 0   pregabalin  (LYRICA )  150 MG capsule Take 1 capsule (150 mg total) by mouth 2 (two) times daily. 60 capsule 6   traMADol  (ULTRAM ) 50 MG tablet Take 1 tablet (50 mg total) by mouth every 6 (six) hours as needed. 12 tablet 0   traZODone  (DESYREL ) 100 MG tablet Take 1.5 tablets (150 mg total) by mouth at bedtime as needed for sleep. (Patient taking differently: Take 100 mg by mouth at bedtime.) 135 tablet 2   No current facility-administered medications on file prior to visit.  [2]  Allergies Allergen Reactions   Hydrocodone Hives   Ibuprofen Other (See Comments)    Pt can't take this medication because it interacts with  other medications that he is taking.    "

## 2024-11-10 NOTE — Patient Instructions (Signed)
" °  VISIT SUMMARY: You had a follow-up appointment today after your recent hospitalization for a kidney stone. We discussed your kidney stone treatment, urinary incontinence, blood pressure management, atrial fibrillation, and the findings from your recent CT scan. You were accompanied by your caregiver, Dorothyann.  YOUR PLAN: -NEPHROLITHIASIS (KIDNEY STONES): You recently had a kidney stone and underwent a procedure to place and then remove a stent. Mild bleeding is expected after the stent removal. You should increase your fluid intake to 4-8 glasses of water  daily and follow a low salt diet to help prevent future stones.  -URINARY INCONTINENCE: Your urinary incontinence has worsened, likely due to your previous stroke. You should continue using Depends and Chux to manage this condition.  -HYPERTENSION: Your blood pressure has been higher than desired. We have restarted your amlodipine  at 10 mg daily. Please monitor your blood pressure weekly with a goal of keeping it below 130/80 mmHg.  -ATRIAL FIBRILLATION: Your atrial fibrillation is a condition where your heart beats irregularly. It is currently well-controlled with labetalol . Continue taking aspirin  daily as prescribed.  -REACTIVE LYMPHADENOPATHY: You have mildly enlarged lymph nodes, likely due to a recent infection. We have ordered a repeat CT scan at the end of January to check on the size of these lymph nodes.  -GENERAL HEALTH MAINTENANCE: You have received your flu shot. Please complete the Cologuard test within the next six months.  INSTRUCTIONS: Please monitor your blood pressure weekly and aim for a reading below 130/80 mmHg. Complete the Cologuard test within the next six months. We have scheduled a repeat CT scan at the end of January to evaluate your lymph nodes.                      Contains text generated by Abridge.                                 Contains text generated by  Abridge.   "

## 2024-11-11 ENCOUNTER — Encounter: Payer: Self-pay | Admitting: Internal Medicine

## 2024-11-11 ENCOUNTER — Other Ambulatory Visit: Payer: Self-pay

## 2024-11-11 ENCOUNTER — Other Ambulatory Visit (HOSPITAL_COMMUNITY): Payer: Self-pay

## 2024-11-16 ENCOUNTER — Telehealth: Payer: Self-pay | Admitting: Internal Medicine

## 2024-11-16 NOTE — Telephone Encounter (Signed)
 Copied from CRM [id number]. Topic: Clinical - Medical Advice >> Nov 16, 2024 11:07 AM Emylou G wrote:  Reason for CRM: wife called.. she had an appt for xray ultrasound for his bladder - but they can't see him because of having to pick him up and they are unable to .SABRA So they need alternative location .SABRA Pls review and contact them back .SABRA

## 2024-11-16 NOTE — Telephone Encounter (Signed)
 Order and OV notes originally and successfully faxed to [facility] in 11/14/2023. Fax number: [phone]. Re-faxed order and OV notes to [facility]. on 11/17/2023. Fax #: [phone].   Called & spoke to Joe who confirmed that the original fax was received. Supplies will be sent to the patient this week. No further assistance needed at this time.

## 2024-11-16 NOTE — Telephone Encounter (Signed)
 Copied from CRM [id number]. Topic: Clinical - Prescription Issue >> Nov 16, 2024 11:09 AM Emylou G wrote:  Reason for CRM: [facility] radiology is waiting on script for his depends.. Pls review.. they need the paperwork to be sent.

## 2024-11-16 NOTE — Telephone Encounter (Signed)
 Will change location for CT scan to [facility] radiology. Order change.  Please call [facility] radiology to schedule for them if this needs to be done.

## 2024-11-16 NOTE — Addendum Note (Signed)
 Addended by: VICCI SOBER B on: 11/16/2024 12:56 PM   Modules accepted: Orders

## 2024-11-17 ENCOUNTER — Telehealth: Payer: Self-pay

## 2024-11-17 NOTE — Telephone Encounter (Signed)
 Called & spoke to [place] (authorized to receive information per DPR on file). Informed of CT scan appointment scheduled for 11/27/23 at [facility]. [name] expressed verbal understanding.   Awaiting CPT codes for PA.

## 2024-11-17 NOTE — Telephone Encounter (Signed)
 [employer] PCS request faxed to [facility]: [phone]

## 2024-11-18 NOTE — Telephone Encounter (Signed)
 Called & spoke to [place] T at [facility]. Phone number : [phone].  Representative: Wendee T Case ID: [id number] CPT: 25822 Approval dates: 11/18/2024 - 01/02/2025 Approval #: [id number] C.A.

## 2024-11-19 ENCOUNTER — Other Ambulatory Visit: Payer: Self-pay

## 2024-11-19 ENCOUNTER — Other Ambulatory Visit: Payer: Self-pay | Admitting: Internal Medicine

## 2024-11-19 ENCOUNTER — Other Ambulatory Visit (HOSPITAL_COMMUNITY): Payer: Self-pay

## 2024-11-19 DIAGNOSIS — I6930 Unspecified sequelae of cerebral infarction: Secondary | ICD-10-CM

## 2024-11-19 MED ORDER — BACLOFEN 20 MG PO TABS
20.0000 mg | ORAL_TABLET | Freq: Three times a day (TID) | ORAL | 0 refills | Status: AC
  Filled 2024-11-19: qty 90, 30d supply, fill #0

## 2024-11-20 ENCOUNTER — Other Ambulatory Visit (HOSPITAL_COMMUNITY): Payer: Self-pay

## 2024-11-20 ENCOUNTER — Other Ambulatory Visit: Payer: Self-pay

## 2024-11-23 ENCOUNTER — Telehealth: Payer: Self-pay | Admitting: Internal Medicine

## 2024-11-23 NOTE — Telephone Encounter (Signed)
 Copied from CRM #[id number]. Topic: Clinical - Order For Equipment >> Nov 23, 2024 11:33 AM   Delon HERO wrote:  Reason for CRM: Bobbi Calling from [facility] is calling to request an order for a hospital bed as the patient can not sit himself up. And patient is incontent. If he had bed rail he could turn himself some to assist with change briefs. In network with [employer] Fax- [phone] Phone [phone] Bobbi CB- [phone]

## 2024-11-23 NOTE — Telephone Encounter (Signed)
 Would he need appt for this or can I just write the rxn? And please find out from pt/wife if they want a hosp bed.

## 2024-11-24 NOTE — Telephone Encounter (Signed)
 I called patient's wife, [name] and she confirmed that the patient would like a hospital bed.  I explained to her that he needs to have an in person face to face encounter with provider to address the need for the bed.  The insurance company requires this documentation.  [name] was very understanding and I scheduled the patient with Dr Vicci 12/28/2024 @0910 .   I returned the call to [facility] and had to leave a message informing her that the patient has been scheduled for in -person appointment with PCP to address the hospital bed.

## 2024-11-26 ENCOUNTER — Ambulatory Visit (HOSPITAL_COMMUNITY)

## 2024-11-29 ENCOUNTER — Telehealth: Payer: Self-pay | Admitting: Internal Medicine

## 2024-11-29 NOTE — Telephone Encounter (Signed)
 Received form from company called [employer] for request for automatic BP monitor. Please inquire from wife if they requested this before sending form in.

## 2024-11-30 NOTE — Telephone Encounter (Signed)
 Called & spoke to  Jason Romero (authorized to receive information per DPR on file). Inquired if they have ordered a BP machine through [employer] and if still interested in receiving one. [name] stated yes and would like for the order to be signed and faxed so they my receive one. Order successfully faxed to [employer] on 11/30/24. Fax #: [phone].

## 2024-12-01 NOTE — Telephone Encounter (Signed)
 Copied from CRM [id number]. Topic: Clinical - Order For Equipment >> Dec 01, 2024  8:11 AM   Mesmerise C wrote:  Reason for CRM: Elle from [facility] [employer] stated faxed over a order form for blood pressure monitor on 1/15 seen on media tab was received if it can be signed and sent back

## 2024-12-01 NOTE — Telephone Encounter (Signed)
 Called [employer] and spoke to [place]. She confirmed that the fax was received yesterday and the order was shipped out today to the patient's home. No further assistance needed at this time.

## 2024-12-10 ENCOUNTER — Other Ambulatory Visit (HOSPITAL_COMMUNITY): Payer: Self-pay

## 2024-12-10 ENCOUNTER — Other Ambulatory Visit: Payer: Self-pay | Admitting: Internal Medicine

## 2024-12-10 DIAGNOSIS — I6930 Unspecified sequelae of cerebral infarction: Secondary | ICD-10-CM

## 2024-12-10 MED ORDER — ATORVASTATIN CALCIUM 20 MG PO TABS
20.0000 mg | ORAL_TABLET | Freq: Every day | ORAL | 1 refills | Status: AC
  Filled 2024-12-10: qty 90, 90d supply, fill #0

## 2024-12-28 ENCOUNTER — Ambulatory Visit: Payer: Self-pay | Admitting: Internal Medicine

## 2025-03-04 ENCOUNTER — Ambulatory Visit: Payer: Self-pay | Admitting: Internal Medicine
